# Patient Record
Sex: Male | Born: 1935 | Race: White | Hispanic: No | Marital: Married | Smoking: Never smoker
Health system: Southern US, Community
[De-identification: ages and names within clinical notes are randomized; demographics above are authoritative.]

## PROBLEM LIST (undated history)

## (undated) DIAGNOSIS — I712 Thoracic aortic aneurysm, without rupture: Secondary | ICD-10-CM

## (undated) DIAGNOSIS — R55 Syncope and collapse: Secondary | ICD-10-CM

## (undated) DIAGNOSIS — Z9889 Other specified postprocedural states: Secondary | ICD-10-CM

## (undated) DIAGNOSIS — R911 Solitary pulmonary nodule: Secondary | ICD-10-CM

## (undated) DIAGNOSIS — I340 Nonrheumatic mitral (valve) insufficiency: Secondary | ICD-10-CM

## (undated) DIAGNOSIS — E785 Hyperlipidemia, unspecified: Secondary | ICD-10-CM

## (undated) DIAGNOSIS — I714 Abdominal aortic aneurysm, without rupture: Secondary | ICD-10-CM

## (undated) DIAGNOSIS — I472 Ventricular tachycardia: Secondary | ICD-10-CM

## (undated) DIAGNOSIS — N183 Chronic kidney disease, stage 3 unspecified: Secondary | ICD-10-CM

## (undated) DIAGNOSIS — G500 Trigeminal neuralgia: Secondary | ICD-10-CM

## (undated) DIAGNOSIS — I129 Hypertensive chronic kidney disease with stage 1 through stage 4 chronic kidney disease, or unspecified chronic kidney disease: Secondary | ICD-10-CM

## (undated) DIAGNOSIS — I4729 Other ventricular tachycardia: Secondary | ICD-10-CM

## (undated) DIAGNOSIS — I10 Essential (primary) hypertension: Secondary | ICD-10-CM

## (undated) DIAGNOSIS — M7710 Lateral epicondylitis, unspecified elbow: Secondary | ICD-10-CM

## (undated) HISTORY — DX: Ventricular tachycardia: I47.2

## (undated) HISTORY — DX: Other ventricular tachycardia: I47.29

## (undated) HISTORY — DX: Lateral epicondylitis, unspecified elbow: M77.10

## (undated) HISTORY — PX: CATARACT EXTRACTION, BILATERAL: SHX1313

## (undated) HISTORY — DX: Chronic kidney disease, stage 3 unspecified: N18.30

## (undated) HISTORY — DX: Trigeminal neuralgia: G50.0

## (undated) HISTORY — DX: Hypertensive chronic kidney disease with stage 1 through stage 4 chronic kidney disease, or unspecified chronic kidney disease: I12.9

## (undated) HISTORY — DX: Hyperlipidemia, unspecified: E78.5

## (undated) HISTORY — DX: Chronic kidney disease, stage 3 (moderate): N18.3

---

## 2005-08-01 ENCOUNTER — Ambulatory Visit: Payer: Self-pay | Admitting: General Surgery

## 2005-08-01 HISTORY — PX: COLONOSCOPY: SHX174

## 2005-08-01 LAB — HM COLONOSCOPY

## 2007-12-24 ENCOUNTER — Encounter: Admission: RE | Admit: 2007-12-24 | Discharge: 2007-12-24 | Payer: Self-pay | Admitting: Neurosurgery

## 2010-02-01 HISTORY — PX: ESOPHAGOGASTRODUODENOSCOPY: SHX1529

## 2010-02-24 ENCOUNTER — Inpatient Hospital Stay: Payer: Self-pay | Admitting: Internal Medicine

## 2010-03-01 LAB — PATHOLOGY REPORT

## 2012-06-10 ENCOUNTER — Ambulatory Visit: Payer: Self-pay | Admitting: Ophthalmology

## 2012-06-23 ENCOUNTER — Ambulatory Visit: Payer: Self-pay | Admitting: Ophthalmology

## 2012-09-01 ENCOUNTER — Ambulatory Visit: Payer: Self-pay | Admitting: Ophthalmology

## 2013-10-01 HISTORY — PX: CYST EXCISION: SHX5701

## 2014-09-23 NOTE — Op Note (Signed)
DATE OF BIRTH:  1929/09/30  DATE OF PROCEDURE:  06/23/2012  PREOPERATIVE DIAGNOSIS: Visually significant cataract of the right eye.   POSTOPERATIVE DIAGNOSIS: Visually significant cataract of the right eye.   OPERATIVE PROCEDURE: Cataract extraction by phacoemulsification with implant of intraocular lens to right eye.   SURGEON: Galen Manila, MD.   ANESTHESIA:  1. Managed anesthesia care.  2. Topical tetracaine drops followed by 2% Xylocaine jelly applied in the preoperative holding area.   COMPLICATIONS: None.   TECHNIQUE:  Stop and chop.  DESCRIPTION OF PROCEDURE: The patient was examined and consented in the preoperative holding area where the aforementioned topical anesthesia was applied to the right eye and then brought back to the Operating Room where the right eye was prepped and draped in the usual sterile ophthalmic fashion and a lid speculum was placed. A paracentesis was created with the side port blade and the anterior chamber was filled with viscoelastic. A near clear corneal incision was performed with the steel keratome. A continuous curvilinear capsulorrhexis was performed with a cystotome followed by the capsulorrhexis forceps. Hydrodissection and hydrodelineation were carried out with BSS on a blunt cannula. The lens was removed in a stop and chop technique and the remaining cortical material was removed with the irrigation-aspiration handpiece. The capsular bag was inflated with viscoelastic and the Tecnis ZCB00 16.0-diopter lens, serial number [id number] was placed in the capsular bag without complication. The remaining viscoelastic was removed from the eye with the irrigation-aspiration handpiece. The wounds were hydrated. The anterior chamber was flushed with Miostat and the eye was inflated to physiologic pressure. 0.1 mL of cefuroxime concentration 10 mg/mL was placed in the anterior chamber. The wounds were found to be water tight. The eye was dressed with Vigamox.  The patient was given protective glasses to wear throughout the day and a shield with which to sleep tonight. The patient was also given drops with which to begin a drop regimen today and will follow-up with me in one day.    ____________________________ Jerilee Field. Rayola Everhart, MD wlp:ms D: 06/23/2012 15:42:42 ET T: 06/23/2012 23:26:13 ET JOB#: [id number]  cc: [place] Souter L. Aulden Calise, MD, <Dictator> Jerilee Field Owen Pagnotta MD ELECTRONICALLY SIGNED 07/09/2012 14:23

## 2014-09-23 NOTE — Op Note (Signed)
PATIENT NAME:  Roy Palmer, Roy Palmer MR#:  [id number] DATE OF BIRTH:  1929/08/21  DATE OF PROCEDURE:  09/01/2012  PREOPERATIVE DIAGNOSIS:  Visually significant cataract of the left eye.   POSTOPERATIVE DIAGNOSIS:  Visually significant cataract of the left eye.   OPERATIVE PROCEDURE:  Cataract extraction by phacoemulsification with implant of intraocular lens to the left eye.   SURGEON:  Galen Manila, MD  ANESTHESIA:  1. Managed anesthesia care.  2. Topical tetracaine drops followed by 2% Xylocaine jelly applied in the preoperative holding area.   COMPLICATIONS:  None.   TECHNIQUE:  Stop-and-chop.  DESCRIPTION OF PROCEDURE:  The patient was examined and consented in the preoperative holding area where the aforementioned topical anesthesia was applied to the left eye and then brought back to the Operating Room where the left eye was prepped and draped in the usual sterile ophthalmic fashion and a lid speculum was placed. A paracentesis was created with the side port blade and the anterior chamber was filled with viscoelastic. A near clear corneal incision was performed with the steel keratome. A continuous curvilinear capsulorrhexis was performed with a cystotome followed by the capsulorrhexis forceps. Hydrodissection and hydrodelineation were carried out with BSS on a blunt cannula. The lens was removed in a stop-and-chop technique and the remaining cortical material was removed with the irrigation-aspiration handpiece. The capsular bag was inflated with viscoelastic and the Tecnis ZCB00 16.0-diopter lens, serial number [id number] was placed in the capsular bag without complication. The remaining viscoelastic was removed from the eye with the irrigation-aspiration handpiece. The wounds were hydrated. The anterior chamber was flushed with Miostat and the eye was inflated to physiologic pressure. 0.1 mL of cefuroxime concentration 10 mg/mL was placed in the anterior chamber. The wounds were found to be  water tight. The eye was dressed with Vigamox. The patient was given protective glasses to wear throughout the day and a shield with which to sleep tonight. The patient was also given drops with which to begin a drop regimen today and will follow-up with me in one day.    ____________________________ Jerilee Field. Odelia Graciano, MD wlp:si D: 09/01/2012 22:05:48 ET T: 09/02/2012 00:43:53 ET JOB#: [id number]  cc: Rubbie Goostree L. Hiral Lukasiewicz, MD, <Dictator> Jerilee Field Aadi Bordner MD ELECTRONICALLY SIGNED 09/02/2012 10:30

## 2015-02-02 DIAGNOSIS — M25551 Pain in right hip: Secondary | ICD-10-CM | POA: Insufficient documentation

## 2015-11-01 ENCOUNTER — Ambulatory Visit: Payer: Self-pay | Admitting: Unknown Physician Specialty

## 2016-02-08 ENCOUNTER — Ambulatory Visit (INDEPENDENT_AMBULATORY_CARE_PROVIDER_SITE_OTHER): Payer: Medicare Other | Admitting: Family Medicine

## 2016-02-08 ENCOUNTER — Encounter: Payer: Self-pay | Admitting: Family Medicine

## 2016-02-08 VITALS — BP 151/80 | HR 67 | Temp 97.7°F | Ht 69.0 in | Wt 192.0 lb

## 2016-02-08 DIAGNOSIS — R42 Dizziness and giddiness: Secondary | ICD-10-CM

## 2016-02-08 NOTE — Patient Instructions (Signed)
Follow up as needed

## 2016-02-08 NOTE — Progress Notes (Signed)
   BP (!) 151/80   Pulse 67   Temp 97.7 F (36.5 C)   Ht 5\' 9"  (1.753 m)   Wt 192 lb (87.1 kg)   SpO2 98%   BMI 28.35 kg/m    Subjective:    Patient ID: Roy Palmer, male    DOB: 03/10/1930, 82 y.o.   MRN: [id number]  HPI: Roy Palmer is a 82 y.o. male  Chief Complaint  Patient presents with  . Other    [facility] nurse came for a visit and told him his pulse was weak. He does notice if he does strenous work he sometimes gets a little dizzy. No Chest pain or SOB.   Patient presents after a [facility] nurse came for a home visit and told him that he had a "weak pulse" and should get checked out. He feels well for the most part, still works outside as often as he can. Has noticed the past 2 months that with exertion he is dizzy, "swimmy headed", and easily fatigued. This has never happened to him before. Denies CP, SOB, LE edema. Known heart murmur (he can't remember what specific one), last saw Cardiology about 15 years ago he believes. Otherwise in very good health, only takes prilosec.   Relevant past medical, surgical, family and social history reviewed and updated as indicated. Interim medical history since our last visit reviewed. Allergies and medications reviewed and updated.  Review of Systems  Constitutional: Positive for fatigue.  HENT: Negative.   Respiratory: Negative.   Cardiovascular: Negative.   Gastrointestinal: Negative.   Genitourinary: Negative.   Musculoskeletal: Negative.   Skin: Negative.   Neurological: Positive for dizziness.  Psychiatric/Behavioral: Negative.     Per HPI unless specifically indicated above     Objective:    BP (!) 151/80   Pulse 67   Temp 97.7 F (36.5 C)   Ht 5\' 9"  (1.753 m)   Wt 192 lb (87.1 kg)   SpO2 98%   BMI 28.35 kg/m   Wt Readings from Last 3 Encounters:  02/08/16 192 lb (87.1 kg)  10/29/13 188 lb (85.3 kg)    Physical Exam  Constitutional: He is oriented to person, place, and time. He appears well-developed and  well-nourished.  HENT:  Head: Atraumatic.  Eyes: Conjunctivae are normal. No scleral icterus.  Neck: Normal range of motion. Neck supple.  Cardiovascular: Normal rate and intact distal pulses.   Murmur (Systolic murmur) heard. Pulmonary/Chest: Effort normal and breath sounds normal.  Musculoskeletal: Normal range of motion.  Neurological: He is alert and oriented to person, place, and time.  Skin: Skin is warm and dry.  Psychiatric: He has a normal mood and affect. His behavior is normal.  Nursing note and vitals reviewed.     Assessment & Plan:   Problem List Items Addressed This Visit    None    Visit Diagnoses    Dizziness    -  Primary   Does have a known murmur, referral placed for Cardiology to evaluate for progressing valvular disase.    Relevant Orders   Ambulatory referral to Cardiology      Follow up plan: Return if symptoms worsen or fail to improve.

## 2016-02-09 ENCOUNTER — Encounter: Payer: Self-pay | Admitting: Internal Medicine

## 2016-02-09 ENCOUNTER — Ambulatory Visit (INDEPENDENT_AMBULATORY_CARE_PROVIDER_SITE_OTHER): Payer: Medicare Other | Admitting: Internal Medicine

## 2016-02-09 VITALS — BP 140/80 | HR 73 | Ht 69.0 in | Wt 194.8 lb

## 2016-02-09 DIAGNOSIS — R011 Cardiac murmur, unspecified: Secondary | ICD-10-CM

## 2016-02-09 DIAGNOSIS — I493 Ventricular premature depolarization: Secondary | ICD-10-CM

## 2016-02-09 DIAGNOSIS — R42 Dizziness and giddiness: Secondary | ICD-10-CM | POA: Diagnosis not present

## 2016-02-09 MED ORDER — ASPIRIN EC 81 MG PO TBEC: 81.0000 mg | DELAYED_RELEASE_TABLET | Freq: Every day | ORAL | Status: DC

## 2016-02-09 NOTE — Progress Notes (Signed)
 New Outpatient Visit Date: 02/09/2016  Referring Provider: Rachel Elizabeth Lane, PA-C [address] [place],  [address]  CC: Dizziness  HPI:  [name] is a 82 y.o. year-old male with history of HTN (not on medications), HLD, CKD, heart murmur, and trigeminal neuralgia, who has been referred by Ms. Lane for evaluation of dizziness.  The patient reports that for the last month, he has experienced several episodes of lightheadedness and nausea when exerting himself on his property.  The symptoms resolve with rest over the course of 30 minutes.  He denies passing out, as well as chest pain, shortness of breath, palpitations, and edema.  He was evaluated by a [facility] nurse ~1 week ago, who reportedly noted a "weak" pulse.  The patient is typically very active and does not recall any precipitating events around the time that his dizziness began.  He reports having a stress test ~10-15 years ago, which he believes was normal.  Past Medical History:  Diagnosis Date  . CKD (chronic kidney disease) stage 3, GFR 30-59 ml/min   . Heart murmur   . Hyperlipidemia   . Hypertension   . Hypertensive kidney disease   . Trigeminal neuralgia     Past Surgical History:  Procedure Laterality Date  . CATARACT EXTRACTION, BILATERAL    . COLONOSCOPY  08/2005  . CYST EXCISION  10/2013   from neck  . ESOPHAGOGASTRODUODENOSCOPY  02/2010    Outpatient Encounter Prescriptions as of 02/09/2016  Medication Sig  . omeprazole (PRILOSEC) 20 MG capsule Take 20 mg by mouth daily.  . aspirin EC 81 MG tablet Take 1 tablet (81 mg total) by mouth daily.   No facility-administered encounter medications on file as of 02/09/2016.     Lyrica [pregabalin]  Social History   Social History  . Marital status: Married    Spouse name: N/A  . Number of children: N/A  . Years of education: N/A   Occupational History  . Not on file.   Social History Main Topics  . Smoking status: Never Smoker  . Smokeless tobacco:  Never Used  . Alcohol use No  . Drug use: No  . Sexual activity: Not on file   Other Topics Concern  . Not on file   Social History Narrative  . No narrative on file    Family History  Problem Relation Age of Onset  . Hypertension Mother   . Stroke Mother   . Heart disease Father   . Heart attack Father   . COPD Neg Hx   . Cancer Neg Hx   . Diabetes Neg Hx     Review of Systems: A 12-system review of systems was performed and was negative except as noted in the HPI.  PHYSICAL EXAM: BP 140/80 (BP Location: Left Arm, Patient Position: Sitting, Cuff Size: Normal)   Pulse 73   Ht 5' 9" (1.753 m)   Wt 194 lb 12.8 oz (88.4 kg)   BMI 28.77 kg/m   General:  Well-developed, well nourished elderly man, seated comfortably in the exam room. HEENT: No conjunctival pallor or scleral icterus.  Moist mucous membranes.  OP clear. Neck: Supple without lymphadenopathy, thyromegaly, JVD, or HJR.  No carotid bruit. Lungs: Normal work of breathing.  Clear to auscultation bilaterally without wheezes or crackles. CV: Regular rate and rhythm with occasional extrasystoles.  3/6 holosystolic murmur loudest at the apex.  Normal S1 and S2.  S4 present.  No S3.  Non-displaced PMI. Abd: Bowel sounds present.    Soft, NT/ND without hepatosplenomegaly Ext: No lower extremity edema.  Radial, PT, and DP pulses are 2+ bilaterally Skin: warm and dry without rash Neuro: CNIII-XII intact.  Strength and fine-touch sensation intact in upper and lower extremities bilaterally. Psych: Normal mood and affect.  EKG:  Sinus rhythm with right bundle branch block and left axis deviation (bifascicular block). Frequent PVCs noted. Compared with prior tracing from 06/10/12, PVCs are new.  ASSESSMENT AND PLAN: 82 y/o man with exertional lightheadedness and nausea over the last month, seen for further evaluation.  1. Dizziness Etiology most concerning for a cardiac etiology, including arrhythmia or structural pathology.   EKG notable for bifascicular block that has been present since at least 2014, as well as frequent PVC's that are new.  We have agreed to proceed with expedited echocardiogram to evaluate for cardiomyopathy and/or significant valvular disease.  Based on the results, we will determine further ischemia evaluation.  I would favor LHC given baseline abnormal EKG and frequent PVC's which may reduce sensitivity/specificity of myocardial perfusion stress test.  I have advised the patient to begin taking ASA 81 mg daily.  We will also have him return for labs at the time of TTE next week.  The patient was advised to limit his activities and to seek immediate medical attention if his dizziness recurs or if he develops chest pain and/or shortness of breath.  2. PVC (premature ventricular contraction) Frequent PVC's noted on EKG, including couplet.  Given age and history of HTN/HLD, ischemia is a concern.  As above, will obtain TTE and then proceed with ischemia evaluation.  Will also check BMP to evaluate for electrolyte abnormalities.  If TTE and ischemia evaluation are unrevealing, will then proceed with ambulatory event monitor.  3. Heart murmur Exam most suggestive of mitral regurgitation.  However, exertional dizziness can be seen with significant AS.  Will proceed with TTE ASAP.  Follow-up: Return to clinic in 1 month; will discuss further work-up in the meantime after review of TTE and Labs.  [name], MD 02/09/2016 9:55 PM

## 2016-02-09 NOTE — Patient Instructions (Addendum)
Medication Instructions:  Your physician recommends that you continue on your current medications as directed. Please refer to the Current Medication list given to you today.    Labwork: Fasting lipid and liver profile, CBC, BMET on Sept 13. Nothing to eat or drink after midnight the evening before your labs.   Testing/Procedures: Your physician has requested that you have an echocardiogram. Echocardiography is a painless test that uses sound waves to create images of your heart. It provides your doctor with information about the size and shape of your heart and how well your heart's chambers and valves are working. This procedure takes approximately one hour. There are no restrictions for this procedure.  Wednesday, Sept 13 @ 7:30am in our office.   Follow-Up: Your physician recommends that you schedule a follow-up appointment in: one month with Dr. Okey Dupre.    Any Other Special Instructions Will Be Listed Below (If Applicable).     If you need a refill on your cardiac medications before your next appointment, please call your pharmacy.  Echocardiogram An echocardiogram, or echocardiography, uses sound waves (ultrasound) to produce an image of your heart. The echocardiogram is simple, painless, obtained within a short period of time, and offers valuable information to your health care provider. The images from an echocardiogram can provide information such as:  Evidence of coronary artery disease (CAD).  Heart size.  Heart muscle function.  Heart valve function.  Aneurysm detection.  Evidence of a past heart attack.  Fluid buildup around the heart.  Heart muscle thickening.  Assess heart valve function. LET [facility] CARE PROVIDER KNOW ABOUT:  Any allergies you have.  All medicines you are taking, including vitamins, herbs, eye drops, creams, and over-the-counter medicines.  Previous problems you or members of your family have had with the use of anesthetics.  Any  blood disorders you have.  Previous surgeries you have had.  Medical conditions you have.  Possibility of pregnancy, if this applies. BEFORE THE PROCEDURE  No special preparation is needed. Eat and drink normally.  PROCEDURE   In order to produce an image of your heart, gel will be applied to your chest and a wand-like tool (transducer) will be moved over your chest. The gel will help transmit the sound waves from the transducer. The sound waves will harmlessly bounce off your heart to allow the heart images to be captured in real-time motion. These images will then be recorded.  You may need an IV to receive a medicine that improves the quality of the pictures. AFTER THE PROCEDURE You may return to your normal schedule including diet, activities, and medicines, unless your health care provider tells you otherwise.   This information is not intended to replace advice given to you by your health care provider. Make sure you discuss any questions you have with your health care provider.   Document Released: 05/17/2000 Document Revised: 06/10/2014 Document Reviewed: 01/25/2013 Elsevier Interactive Patient Education [employer]

## 2016-02-14 ENCOUNTER — Ambulatory Visit (INDEPENDENT_AMBULATORY_CARE_PROVIDER_SITE_OTHER): Payer: Medicare Other

## 2016-02-14 ENCOUNTER — Other Ambulatory Visit (INDEPENDENT_AMBULATORY_CARE_PROVIDER_SITE_OTHER): Payer: Medicare Other

## 2016-02-14 ENCOUNTER — Other Ambulatory Visit: Payer: Self-pay

## 2016-02-14 DIAGNOSIS — R011 Cardiac murmur, unspecified: Secondary | ICD-10-CM

## 2016-02-14 DIAGNOSIS — R42 Dizziness and giddiness: Secondary | ICD-10-CM

## 2016-02-14 HISTORY — PX: TRANSTHORACIC ECHOCARDIOGRAM: SHX275

## 2016-02-15 ENCOUNTER — Telehealth: Payer: Self-pay | Admitting: Internal Medicine

## 2016-02-15 LAB — HEPATIC FUNCTION PANEL
ALT: 22 IU/L (ref 0–44)
AST: 14 IU/L (ref 0–40)
Albumin: 4.1 g/dL (ref 3.5–4.7)
Alkaline Phosphatase: 52 IU/L (ref 39–117)
Bilirubin Total: 0.5 mg/dL (ref 0.0–1.2)
Bilirubin, Direct: 0.12 mg/dL (ref 0.00–0.40)
Total Protein: 6.5 g/dL (ref 6.0–8.5)

## 2016-02-15 LAB — BASIC METABOLIC PANEL
BUN/Creatinine Ratio: 13 (ref 10–24)
BUN: 19 mg/dL (ref 8–27)
CO2: 25 mmol/L (ref 18–29)
Calcium: 8.7 mg/dL (ref 8.6–10.2)
Chloride: 102 mmol/L (ref 96–106)
Creatinine, Ser: 1.52 mg/dL — ABNORMAL HIGH (ref 0.76–1.27)
GFR calc Af Amer: 48 mL/min/{1.73_m2} — ABNORMAL LOW (ref >59)
GFR calc non Af Amer: 41 mL/min/{1.73_m2} — ABNORMAL LOW (ref >59)
Glucose: 91 mg/dL (ref 65–99)
Potassium: 4.7 mmol/L (ref 3.5–5.2)
Sodium: 142 mmol/L (ref 134–144)

## 2016-02-15 LAB — CBC
Hematocrit: 44.5 % (ref 37.5–51.0)
Hemoglobin: 15.1 g/dL (ref 12.6–17.7)
MCH: 31.3 pg (ref 26.6–33.0)
MCHC: 33.9 g/dL (ref 31.5–35.7)
MCV: 92 fL (ref 79–97)
Platelets: 168 10*3/uL (ref 150–379)
RBC: 4.82 x10E6/uL (ref 4.14–5.80)
RDW: 13.9 % (ref 12.3–15.4)
WBC: 7.2 10*3/uL (ref 3.4–10.8)

## 2016-02-15 LAB — LIPID PANEL
Chol/HDL Ratio: 5.2 ratio units — ABNORMAL HIGH (ref 0.0–5.0)
Cholesterol, Total: 188 mg/dL (ref 100–199)
HDL: 36 mg/dL — ABNORMAL LOW (ref >39)
LDL Calculated: 127 mg/dL — ABNORMAL HIGH (ref 0–99)
Triglycerides: 124 mg/dL (ref 0–149)
VLDL Cholesterol Cal: 25 mg/dL (ref 5–40)

## 2016-02-15 NOTE — Telephone Encounter (Signed)
I spoke with the patient regarding his labs and echo results.  TTE was notable for moderate to severe MR with possible flail posterior leaflet, as well as borderline low LVEF with possible inferolateral hypokinesis.  In light of his dizziness and abnormal EKG with these echo findings, the patient will need TEE and LHC/RHC for further evaluation.  We will plan to schedule this for the next 1-2 weeks at [facility].  He will also need prehydration given his CKD stage III.  Patient reports feeling well without additional episodes of presyncope since our visit last week.  [name], MD [facility] Pager: [phone]

## 2016-02-16 ENCOUNTER — Other Ambulatory Visit: Payer: Self-pay | Admitting: Internal Medicine

## 2016-02-16 ENCOUNTER — Telehealth: Payer: Self-pay | Admitting: Internal Medicine

## 2016-02-16 ENCOUNTER — Other Ambulatory Visit: Payer: Self-pay

## 2016-02-16 DIAGNOSIS — Z01812 Encounter for preprocedural laboratory examination: Secondary | ICD-10-CM

## 2016-02-16 DIAGNOSIS — I340 Nonrheumatic mitral (valve) insufficiency: Secondary | ICD-10-CM

## 2016-02-16 NOTE — Telephone Encounter (Signed)
Reviewed Dr. Serita Kyle recommendations regarding TEE and L/R heart cath w/pt who is agreeable w/plan. See echo result notes Reviewed instructions and placed a copy in mail to pt home address. Confirmed f/u appt.

## 2016-02-20 ENCOUNTER — Ambulatory Visit
Admission: RE | Admit: 2016-02-20 | Discharge: 2016-02-20 | Disposition: A | Discharge status: Home / Self Care | Payer: Medicare Other | Source: Ambulatory Visit | Attending: Internal Medicine | Admitting: Internal Medicine

## 2016-02-20 ENCOUNTER — Other Ambulatory Visit
Admission: RE | Admit: 2016-02-20 | Discharge: 2016-02-20 | Disposition: A | Discharge status: Home / Self Care | Payer: Medicare Other | Source: Ambulatory Visit | Attending: Internal Medicine | Admitting: Internal Medicine

## 2016-02-20 ENCOUNTER — Ambulatory Visit: Payer: Medicare Other | Admitting: Internal Medicine

## 2016-02-20 DIAGNOSIS — Z01818 Encounter for other preprocedural examination: Secondary | ICD-10-CM | POA: Diagnosis not present

## 2016-02-20 DIAGNOSIS — Z01812 Encounter for preprocedural laboratory examination: Secondary | ICD-10-CM

## 2016-02-20 LAB — PROTIME-INR
INR: 1.02
Prothrombin Time: 13.4 seconds (ref 11.4–15.2)

## 2016-02-22 ENCOUNTER — Telehealth: Payer: Self-pay | Admitting: Internal Medicine

## 2016-02-22 NOTE — Telephone Encounter (Signed)
Reviewed cardiac cath and TEE instructions w/pt who verbalized understanding. He had no questions at this time.

## 2016-02-23 ENCOUNTER — Ambulatory Visit (HOSPITAL_BASED_OUTPATIENT_CLINIC_OR_DEPARTMENT_OTHER)
Admission: RE | Admit: 2016-02-23 | Discharge: 2016-02-23 | Disposition: A | Discharge status: Home / Self Care | Payer: Medicare Other | Source: Ambulatory Visit | Attending: Internal Medicine | Admitting: Internal Medicine

## 2016-02-23 ENCOUNTER — Other Ambulatory Visit: Payer: Self-pay | Admitting: Internal Medicine

## 2016-02-23 ENCOUNTER — Encounter (HOSPITAL_COMMUNITY)
Admission: RE | Disposition: A | Discharge status: Home / Self Care | Payer: Self-pay | Source: Ambulatory Visit | Attending: Internal Medicine

## 2016-02-23 ENCOUNTER — Ambulatory Visit (HOSPITAL_COMMUNITY)
Admission: RE | Admit: 2016-02-23 | Discharge: 2016-02-23 | Disposition: A | Discharge status: Home / Self Care | Payer: Medicare Other | Source: Ambulatory Visit | Attending: Internal Medicine | Admitting: Internal Medicine

## 2016-02-23 DIAGNOSIS — I340 Nonrheumatic mitral (valve) insufficiency: Secondary | ICD-10-CM

## 2016-02-23 DIAGNOSIS — R55 Syncope and collapse: Secondary | ICD-10-CM

## 2016-02-23 DIAGNOSIS — Z8249 Family history of ischemic heart disease and other diseases of the circulatory system: Secondary | ICD-10-CM | POA: Diagnosis not present

## 2016-02-23 DIAGNOSIS — I2510 Atherosclerotic heart disease of native coronary artery without angina pectoris: Secondary | ICD-10-CM | POA: Diagnosis not present

## 2016-02-23 DIAGNOSIS — R42 Dizziness and giddiness: Secondary | ICD-10-CM

## 2016-02-23 DIAGNOSIS — I2584 Coronary atherosclerosis due to calcified coronary lesion: Secondary | ICD-10-CM | POA: Insufficient documentation

## 2016-02-23 DIAGNOSIS — N183 Chronic kidney disease, stage 3 (moderate): Secondary | ICD-10-CM | POA: Insufficient documentation

## 2016-02-23 DIAGNOSIS — I452 Bifascicular block: Secondary | ICD-10-CM | POA: Insufficient documentation

## 2016-02-23 DIAGNOSIS — Z823 Family history of stroke: Secondary | ICD-10-CM | POA: Diagnosis not present

## 2016-02-23 DIAGNOSIS — E785 Hyperlipidemia, unspecified: Secondary | ICD-10-CM | POA: Insufficient documentation

## 2016-02-23 DIAGNOSIS — Z7982 Long term (current) use of aspirin: Secondary | ICD-10-CM | POA: Insufficient documentation

## 2016-02-23 DIAGNOSIS — G500 Trigeminal neuralgia: Secondary | ICD-10-CM | POA: Diagnosis not present

## 2016-02-23 DIAGNOSIS — I129 Hypertensive chronic kidney disease with stage 1 through stage 4 chronic kidney disease, or unspecified chronic kidney disease: Secondary | ICD-10-CM | POA: Insufficient documentation

## 2016-02-23 HISTORY — DX: Nonrheumatic mitral (valve) insufficiency: I34.0

## 2016-02-23 HISTORY — PX: CARDIAC CATHETERIZATION: SHX172

## 2016-02-23 HISTORY — PX: TEE WITHOUT CARDIOVERSION: SHX5443

## 2016-02-23 LAB — POCT I-STAT 3, VENOUS BLOOD GAS (G3P V)
Acid-base deficit: 1 mmol/L (ref 0.0–2.0)
Acid-base deficit: 2 mmol/L (ref 0.0–2.0)
Bicarbonate: 24.2 mmol/L (ref 20.0–28.0)
Bicarbonate: 25.8 mmol/L (ref 20.0–28.0)
O2 Saturation: 55 %
O2 Saturation: 57 %
TCO2: 26 mmol/L (ref 0–100)
TCO2: 27 mmol/L (ref 0–100)
pCO2, Ven: 45.7 mmHg (ref 44.0–60.0)
pCO2, Ven: 48.3 mmHg (ref 44.0–60.0)
pH, Ven: 7.333 (ref 7.250–7.430)
pH, Ven: 7.335 (ref 7.250–7.430)
pO2, Ven: 31 mmHg — CL (ref 32.0–45.0)
pO2, Ven: 32 mmHg (ref 32.0–45.0)

## 2016-02-23 LAB — CREATININE, SERUM
Creatinine, Ser: 1.39 mg/dL — ABNORMAL HIGH (ref 0.61–1.24)
GFR calc Af Amer: 52 mL/min — ABNORMAL LOW (ref >60)
GFR calc non Af Amer: 45 mL/min — ABNORMAL LOW (ref >60)

## 2016-02-23 LAB — POCT I-STAT 3, ART BLOOD GAS (G3+)
Acid-base deficit: 2 mmol/L (ref 0.0–2.0)
Bicarbonate: 24.6 mmol/L (ref 20.0–28.0)
O2 Saturation: 93 %
TCO2: 26 mmol/L (ref 0–100)
pCO2 arterial: 46.3 mmHg (ref 32.0–48.0)
pH, Arterial: 7.333 — ABNORMAL LOW (ref 7.350–7.450)
pO2, Arterial: 73 mmHg — ABNORMAL LOW (ref 83.0–108.0)

## 2016-02-23 SURGERY — RIGHT/LEFT HEART CATH AND CORONARY ANGIOGRAPHY
Anesthesia: LOCAL

## 2016-02-23 SURGERY — ECHOCARDIOGRAM, TRANSESOPHAGEAL
Anesthesia: Moderate Sedation

## 2016-02-23 MED ORDER — FENTANYL CITRATE (PF) 100 MCG/2ML IJ SOLN
INTRAMUSCULAR | Status: DC | PRN
  Administered 2016-02-23: 25 ug via INTRAVENOUS

## 2016-02-23 MED ORDER — HEPARIN (PORCINE) IN NACL 2-0.9 UNIT/ML-% IJ SOLN
INTRAMUSCULAR | Status: DC | PRN
  Administered 2016-02-23: 1500 mL

## 2016-02-23 MED ORDER — ASPIRIN 81 MG PO CHEW: 81.0000 mg | CHEWABLE_TABLET | ORAL | Status: DC

## 2016-02-23 MED ORDER — HEPARIN SODIUM (PORCINE) 1000 UNIT/ML IJ SOLN
INTRAMUSCULAR | Status: DC | PRN
Start: 2016-02-23 — End: 2016-02-23
  Administered 2016-02-23: 4000 [IU] via INTRAVENOUS

## 2016-02-23 MED ORDER — LIDOCAINE HCL (PF) 1 % IJ SOLN
INTRAMUSCULAR | Status: DC | PRN
  Administered 2016-02-23: 2 mL

## 2016-02-23 MED ORDER — VERAPAMIL HCL 2.5 MG/ML IV SOLN
INTRAVENOUS | Status: DC | PRN
  Administered 2016-02-23: 10 mL via INTRA_ARTERIAL

## 2016-02-23 MED ORDER — VERAPAMIL HCL 2.5 MG/ML IV SOLN
INTRAVENOUS | Status: AC
  Filled 2016-02-23: qty 2

## 2016-02-23 MED ORDER — MIDAZOLAM HCL 2 MG/2ML IJ SOLN
INTRAMUSCULAR | Status: AC
  Filled 2016-02-23: qty 2

## 2016-02-23 MED ORDER — SODIUM CHLORIDE 0.9 % IV SOLN: 250.0000 mL | INTRAVENOUS | Status: DC | PRN

## 2016-02-23 MED ORDER — HEPARIN (PORCINE) IN NACL 2-0.9 UNIT/ML-% IJ SOLN
INTRAMUSCULAR | Status: AC
  Filled 2016-02-23: qty 500

## 2016-02-23 MED ORDER — SODIUM CHLORIDE 0.9% FLUSH: 3.0000 mL | Freq: Two times a day (BID) | INTRAVENOUS | Status: DC

## 2016-02-23 MED ORDER — SODIUM CHLORIDE 0.9 % IV SOLN
INTRAVENOUS | Status: DC
  Administered 2016-02-23: 10:00:00 via INTRAVENOUS

## 2016-02-23 MED ORDER — ATORVASTATIN CALCIUM 20 MG PO TABS: 20.0000 mg | ORAL_TABLET | Freq: Every day | ORAL | 3 refills | Status: DC

## 2016-02-23 MED ORDER — IOPAMIDOL (ISOVUE-370) INJECTION 76%
INTRAVENOUS | Status: DC | PRN
  Administered 2016-02-23: 40 mL

## 2016-02-23 MED ORDER — IOPAMIDOL (ISOVUE-370) INJECTION 76%
INTRAVENOUS | Status: AC
  Filled 2016-02-23: qty 100

## 2016-02-23 MED ORDER — SODIUM CHLORIDE 0.9% FLUSH: 3.0000 mL | INTRAVENOUS | Status: DC | PRN

## 2016-02-23 MED ORDER — AMIODARONE HCL 200 MG PO TABS: ORAL_TABLET | ORAL | 1 refills | Status: DC

## 2016-02-23 MED ORDER — MIDAZOLAM HCL 10 MG/2ML IJ SOLN
INTRAMUSCULAR | Status: DC | PRN
  Administered 2016-02-23 (×2): 2 mg via INTRAVENOUS

## 2016-02-23 MED ORDER — MIDAZOLAM HCL 5 MG/ML IJ SOLN
INTRAMUSCULAR | Status: AC
  Filled 2016-02-23: qty 2

## 2016-02-23 MED ORDER — FENTANYL CITRATE (PF) 100 MCG/2ML IJ SOLN
INTRAMUSCULAR | Status: AC
  Filled 2016-02-23: qty 2

## 2016-02-23 MED ORDER — LIDOCAINE HCL (PF) 1 % IJ SOLN
INTRAMUSCULAR | Status: AC
  Filled 2016-02-23: qty 30

## 2016-02-23 MED ORDER — FENTANYL CITRATE (PF) 100 MCG/2ML IJ SOLN
INTRAMUSCULAR | Status: DC | PRN
  Administered 2016-02-23: 12.5 ug via INTRAVENOUS
  Administered 2016-02-23: 25 ug via INTRAVENOUS
  Administered 2016-02-23: 12.5 ug via INTRAVENOUS

## 2016-02-23 MED ORDER — MIDAZOLAM HCL 2 MG/2ML IJ SOLN
INTRAMUSCULAR | Status: DC | PRN
  Administered 2016-02-23: 1 mg via INTRAVENOUS

## 2016-02-23 MED ORDER — BUTAMBEN-TETRACAINE-BENZOCAINE 2-2-14 % EX AERO
INHALATION_SPRAY | CUTANEOUS | Status: DC | PRN
  Administered 2016-02-23: 2 via TOPICAL

## 2016-02-23 SURGICAL SUPPLY — 13 items
CATH BALLN WEDGE 5F 110CM (CATHETERS) ×2 IMPLANT
CATH INFINITI 5 FR JL3.5 (CATHETERS) ×2 IMPLANT
CATH INFINITI JR4 5F (CATHETERS) ×2 IMPLANT
DEVICE RAD COMP TR BAND LRG (VASCULAR PRODUCTS) ×2 IMPLANT
GLIDESHEATH SLEND SS 6F .021 (SHEATH) ×2 IMPLANT
KIT HEART LEFT (KITS) ×2 IMPLANT
PACK CARDIAC CATHETERIZATION (CUSTOM PROCEDURE TRAY) ×2 IMPLANT
SHEATH FAST CATH BRACH 5F 5CM (SHEATH) ×2 IMPLANT
SYR MEDRAD MARK V 150ML (SYRINGE) ×2 IMPLANT
TRANSDUCER W/STOPCOCK (MISCELLANEOUS) ×4 IMPLANT
TUBING CIL FLEX 10 FLL-RA (TUBING) ×2 IMPLANT
WIRE HI TORQ VERSACORE-J 145CM (WIRE) ×2 IMPLANT
WIRE SAFE-T 1.5MM-J .035X260CM (WIRE) ×2 IMPLANT

## 2016-02-23 NOTE — H&P (View-Only) (Signed)
New Outpatient Visit Date: 02/09/2016  Referring Provider: Particia Nearingachel Elizabeth Lane, PA-C [address] [place], [place] [id number]  CC: Dizziness  HPI:  [name] is a 82 y.o. year-old male with history of HTN (not on medications), HLD, CKD, heart murmur, and trigeminal neuralgia, who has been referred by Ms. Lane for evaluation of dizziness.  The patient reports that for the last month, he has experienced several episodes of lightheadedness and nausea when exerting himself on his property.  The symptoms resolve with rest over the course of 30 minutes.  He denies passing out, as well as chest pain, shortness of breath, palpitations, and edema.  [facility], who reportedly noted a "weak" pulse.  The patient is typically very active and does not recall any precipitating events around the time that his dizziness began.  He reports having a stress test ~10-15 years ago, which he believes was normal.  Past Medical History:  Diagnosis Date  . CKD (chronic kidney disease) stage 3, GFR 30-59 ml/min   . Heart murmur   . Hyperlipidemia   . Hypertension   . Hypertensive kidney disease   . Trigeminal neuralgia     Past Surgical History:  Procedure Laterality Date  . CATARACT EXTRACTION, BILATERAL    . COLONOSCOPY  08/2005  . CYST EXCISION  10/2013   from neck  . ESOPHAGOGASTRODUODENOSCOPY  02/2010    Outpatient Encounter Prescriptions as of 02/09/2016  Medication Sig  . omeprazole (PRILOSEC) 20 MG capsule Take 20 mg by mouth daily.  Marland Kitchen. aspirin EC 81 MG tablet Take 1 tablet (81 mg total) by mouth daily.   No facility-administered encounter medications on file as of 02/09/2016.     Lyrica [pregabalin]  Social History   Social History  . Marital status: Married    Spouse name: N/A  . Number of children: N/A  . Years of education: N/A   Occupational History  . Not on file.   Social History Main Topics  . Smoking status: Never Smoker  . Smokeless tobacco:  Never Used  . Alcohol use No  . Drug use: No  . Sexual activity: Not on file   Other Topics Concern  . Not on file   Social History Narrative  . No narrative on file    Family History  Problem Relation Age of Onset  . Hypertension Mother   . Stroke Mother   . Heart disease Father   . Heart attack Father   . COPD Neg Hx   . Cancer Neg Hx   . Diabetes Neg Hx     Review of Systems: A 12-system review of systems was performed and was negative except as noted in the HPI.  PHYSICAL EXAM: BP 140/80 (BP Location: Left Arm, Patient Position: Sitting, Cuff Size: Normal)   Pulse 73   Ht 5\' 9"  (1.753 m)   Wt 194 lb 12.8 oz (88.4 kg)   BMI 28.77 kg/m   General:  Well-developed, well nourished elderly man, seated comfortably in the exam room. HEENT: No conjunctival pallor or scleral icterus.  Moist mucous membranes.  OP clear. Neck: Supple without lymphadenopathy, thyromegaly, JVD, or HJR.  No carotid bruit. Lungs: Normal work of breathing.  Clear to auscultation bilaterally without wheezes or crackles. CV: Regular rate and rhythm with occasional extrasystoles.  3/6 holosystolic murmur loudest at the apex.  Normal S1 and S2.  S4 present.  No S3.  Non-displaced PMI. Abd: Bowel sounds present.  Soft, NT/ND without hepatosplenomegaly Ext: No lower extremity edema.  Radial, PT, and DP pulses are 2+ bilaterally Skin: warm and dry without rash Neuro: CNIII-XII intact.  Strength and fine-touch sensation intact in upper and lower extremities bilaterally. Psych: Normal mood and affect.  EKG:  Sinus rhythm with right bundle branch block and left axis deviation (bifascicular block). Frequent PVCs noted. Compared with prior tracing from 06/10/12, PVCs are new.  ASSESSMENT AND PLAN: 82 y/o man with exertional lightheadedness and nausea over the last month, seen for further evaluation.  1. Dizziness Etiology most concerning for a cardiac etiology, including arrhythmia or structural pathology.   EKG notable for bifascicular block that has been present since at least 2014, as well as frequent PVC's that are new.  We have agreed to proceed with expedited echocardiogram to evaluate for cardiomyopathy and/or significant valvular disease.  Based on the results, we will determine further ischemia evaluation.  I would favor LHC given baseline abnormal EKG and frequent PVC's which may reduce sensitivity/specificity of myocardial perfusion stress test.  I have advised the patient to begin taking ASA 81 mg daily.  We will also have him return for labs at the time of TTE next week.  The patient was advised to limit his activities and to seek immediate medical attention if his dizziness recurs or if he develops chest pain and/or shortness of breath.  2. PVC (premature ventricular contraction) Frequent PVC's noted on EKG, including couplet.  Given age and history of HTN/HLD, ischemia is a concern.  As above, will obtain TTE and then proceed with ischemia evaluation.  Will also check BMP to evaluate for electrolyte abnormalities.  If TTE and ischemia evaluation are unrevealing, will then proceed with ambulatory event monitor.  3. Heart murmur Exam most suggestive of mitral regurgitation.  However, exertional dizziness can be seen with significant AS.  Will proceed with TTE ASAP.  Follow-up: Return to clinic in 1 month; will discuss further work-up in the meantime after review of TTE and Labs.  [name], MD 02/09/2016 9:55 PM

## 2016-02-23 NOTE — Discharge Instructions (Signed)
Radial Site Care Refer to this sheet in the next few weeks. These instructions provide you with information about caring for yourself after your procedure. Your health care provider may also give you more specific instructions. Your treatment has been planned according to current medical practices, but problems sometimes occur. Call your health care provider if you have any problems or questions after your procedure. WHAT TO EXPECT AFTER THE PROCEDURE After your procedure, it is typical to have the following:  Bruising at the radial site that usually fades within 1-2 weeks.  Blood collecting in the tissue (hematoma) that may be painful to the touch. It should usually decrease in size and tenderness within 1-2 weeks. HOME CARE INSTRUCTIONS  Take medicines only as directed by your health care provider.  You may shower 24-48 hours after the procedure or as directed by your health care provider. Remove the bandage (dressing) and gently wash the site with plain soap and water. Pat the area dry with a clean towel. Do not rub the site, because this may cause bleeding.  Do not take baths, swim, or use a hot tub until your health care provider approves.  Check your insertion site every day for redness, swelling, or drainage.  Do not apply powder or lotion to the site.  Do not flex or bend the affected arm for 24 hours or as directed by your health care provider.  Do not push or pull heavy objects with the affected arm for 24 hours or as directed by your health care provider.  Do not lift over 10 lb (4.5 kg) for 5 days after your procedure or as directed by your health care provider.  Ask your health care provider when it is okay to:  Return to work or school.  Resume usual physical activities or sports.  Resume sexual activity.  Do not drive home if you are discharged the same day as the procedure. Have someone else drive you.  You may drive 24 hours after the procedure unless otherwise  instructed by your health care provider.  Do not operate machinery or power tools for 24 hours after the procedure.  If your procedure was done as an outpatient procedure, which means that you went home the same day as your procedure, a responsible adult should be with you for the first 24 hours after you arrive home.  Keep all follow-up visits as directed by your health care provider. This is important. SEEK MEDICAL CARE IF:  You have a fever.  You have chills.  You have increased bleeding from the radial site. Hold pressure on the site. SEEK IMMEDIATE MEDICAL CARE IF:  You have unusual pain at the radial site.  You have redness, warmth, or swelling at the radial site.  You have drainage (other than a small amount of blood on the dressing) from the radial site.  The radial site is bleeding, and the bleeding does not stop after 30 minutes of holding steady pressure on the site.  Your arm or hand becomes pale, cool, tingly, or numb.   This information is not intended to replace advice given to you by your health care provider. Make sure you discuss any questions you have with your health care provider.   Document Released: 06/22/2010 Document Revised: 06/10/2014 Document Reviewed: 12/06/2013 Elsevier Interactive Patient Education 2016 [employer]. TEE  YOU HAD AN CARDIAC PROCEDURE TODAY: Refer to the procedure report and other information in the discharge instructions given to you for any specific questions about what  was found during the examination. If this information does not answer your questions, please call [facility] office at [phone] to clarify.   DIET: Your first meal following the procedure should be a light meal and then it is ok to progress to your normal diet. A half-sandwich or bowl of soup is an example of a good first meal. Heavy or fried foods are harder to digest and may make you feel nauseous or bloated. Drink plenty of fluids but you should avoid  alcoholic beverages for 24 hours. If you had a esophageal dilation, please see attached instructions for diet.   ACTIVITY: Your care partner should take you home directly after the procedure. You should plan to take it easy, moving slowly for the rest of the day. You can resume normal activity the day after the procedure however YOU SHOULD NOT DRIVE, use power tools, machinery or perform tasks that involve climbing or major physical exertion for 24 hours (because of the sedation medicines used during the test).   SYMPTOMS TO REPORT IMMEDIATELY: A cardiologist can be reached at any hour. Please call [phone] for any of the following symptoms:  Vomiting of blood or coffee ground material  New, significant abdominal pain  New, significant chest pain or pain under the shoulder blades  Painful or persistently difficult swallowing  New shortness of breath  Black, tarry-looking or red, bloody stools  FOLLOW UP:  Please also call with any specific questions about appointments or follow up tests.

## 2016-02-23 NOTE — Progress Notes (Signed)
  Echocardiogram Echocardiogram Transesophageal has been performed.  Leta Jungling M 02/23/2016, 12:51 PM

## 2016-02-23 NOTE — Interval H&P Note (Signed)
History and Physical Interval Note:  02/23/2016 10:49 AM  Roy Palmer  has presented today for transesophageal echocargiogram, with the diagnosis of mitral regurgitation and presyncope.  The various methods of treatment have been discussed with the patient and family. After consideration of risks, benefits and other options for treatment, the patient has consented to  Procedure(s): TRANSESOPHAGEAL ECHOCARDIOGRAM (TEE) (N/A) as a surgical intervention .  The patient's history has been reviewed, patient examined, no change in status, stable for surgery.  I have reviewed the patient's chart and labs.  Questions were answered to the patient's satisfaction.     Harvest Stanco

## 2016-02-23 NOTE — Interval H&P Note (Signed)
History and Physical Interval Note:  02/23/2016 10:24 AM  Roy Palmer  has presented today for cardiac catheterization, with the diagnosis of mitral regurgitation and presyncope.  The various methods of treatment have been discussed with the patient and family. After consideration of risks, benefits and other options for treatment, the patient has consented to  Procedure(s): Right/Left Heart Cath and Coronary Angiography (N/A) as a surgical intervention .  The patient's history has been reviewed, patient examined, no change in status, stable for surgery.  I have reviewed the patient's chart and labs.  Questions were answered to the patient's satisfaction.    Cath Lab Visit (complete for each Cath Lab visit)  Clinical Evaluation Leading to the Procedure:   ACS: No.  Non-ACS:    Anginal Classification: CCS III (fatigue and presyncope with mild exertion)  Anti-ischemic medical therapy: No Therapy  Non-Invasive Test Results: No non-invasive testing performed  Prior CABG: No previous CABG   Barret Esquivel

## 2016-02-23 NOTE — CV Procedure (Signed)
    Transesophageal Echocardiogram Note  Roy Palmer [id number] November 22, 1929  Procedure: Transesophageal Echocardiogram Indications: Mitral regurgitation, presyncope  Operators: [name], MD and Armanda Magic, MD  Procedure Details Consent: Obtained Time Out: Verified patient identification, verified procedure, site/side was marked, verified correct patient position, special equipment/implants available, Radiology Safety Procedures followed,  medications/allergies/relevent history reviewed, required imaging and test results available.  Performed  Medications:  During this procedure the patient is administered a total of Versed 4 mg and Fentanyl 50 mcg  to achieve and maintain moderate conscious sedation.  The patient's heart rate, blood pressure, and oxygen saturation are monitored continuously during the procedure. The period of conscious sedation is 11 minutes, of which I was present face-to-face 100% of this time.  Left Ventrical:  Normal size, low normal contraction.  Mitral Valve: Degenerative with ruptured chord and flail P2 segment.  Severe MR with pulmonary vein flow reversal.  Aortic Valve: Mildly thickened; trivial aortic regurgitation.  Tricuspid Valve: Normal appearance with mild to moderate regurgitation.  Pulmonic Valve: Normal appearance with mild regurgitation.  Left Atrium/ Left atrial appendage: No thrombus  Atrial septum: Mildly thickened (? Lipomatous hypertrophy).  No color flow across septum.  Bubble study not performed.  Aorta: Mild atherosclerotic plaquing.   Complications: No apparent complications Patient did tolerate procedure well.  Dr. Mayford Knife was present for the entire procedure as proctor.  [name], MD 02/23/2016, 12:27 PM

## 2016-02-24 ENCOUNTER — Other Ambulatory Visit: Payer: Self-pay | Admitting: Internal Medicine

## 2016-02-24 ENCOUNTER — Encounter (HOSPITAL_COMMUNITY): Payer: Self-pay | Admitting: Cardiology

## 2016-02-24 DIAGNOSIS — N183 Chronic kidney disease, stage 3 unspecified: Secondary | ICD-10-CM

## 2016-02-24 DIAGNOSIS — Z9189 Other specified personal risk factors, not elsewhere classified: Secondary | ICD-10-CM

## 2016-02-24 DIAGNOSIS — R42 Dizziness and giddiness: Secondary | ICD-10-CM

## 2016-02-24 DIAGNOSIS — Z79899 Other long term (current) drug therapy: Secondary | ICD-10-CM

## 2016-02-24 DIAGNOSIS — I493 Ventricular premature depolarization: Secondary | ICD-10-CM | POA: Insufficient documentation

## 2016-02-24 NOTE — Addendum Note (Signed)
Addended by: Tarea Skillman A on: 02/24/2016 03:16 PM   Modules accepted: Orders

## 2016-02-24 NOTE — Progress Notes (Addendum)
TEE and LHC/RHC yesterday were notable for severe eccentric MR with chordal rupture and flail P2 segment of the mitral valve.  RHC showed normal left and right heart filling pressures but decreased Fick cardiac output.  Coronary angiography showed mild, non-obstructive CAD.  The patient was observed to have frequent PVC's during the case, similar to his EKG during our office visit earlier this month.  We will obtain PFT's and TSH next week and begin amiodarone to see if his PVC's and symptoms improve.   I will also check a BMP and Mg to ensure his renal function and electrolytes are stable following yesterday's L/RHC, given the patient's CKD.  We will try to get a 24 hour Holter monitor before starting amiodarone in order to establish a baseline PVC burden.  I will also make arrangements for the patient to be seen by Dr. Cornelius Moras (T[facility]) to discuss surgical treatment options for his MR.  [name], MD [facility] Pager: [phone]

## 2016-02-26 ENCOUNTER — Telehealth: Payer: Self-pay | Admitting: Internal Medicine

## 2016-02-26 ENCOUNTER — Encounter (HOSPITAL_COMMUNITY): Payer: Self-pay | Admitting: Internal Medicine

## 2016-02-26 ENCOUNTER — Other Ambulatory Visit: Payer: Self-pay

## 2016-02-26 DIAGNOSIS — I340 Nonrheumatic mitral (valve) insufficiency: Secondary | ICD-10-CM

## 2016-02-26 NOTE — Telephone Encounter (Signed)
Pt states he had a cath on Friday 9/22. States since then he has felt faint, and cant hardly get up with out feeling as if he is going to pass out. Please call.

## 2016-02-26 NOTE — Telephone Encounter (Signed)
I spoke with Roy Palmer by phone.  He reports that his lightheadedness has resolved and he feels back to his baseline.  He checked his BP and HR near the Karine Garn of his lightheadedness episode and reports BP 143/83, HR 57.  I advised him to come to the clinic tomorrow morning at around 9:15 AM for labs and placement of 24-hour Holter monitor at 9:30, followed by PFT's at 10:30.  I will have him start amiodarone after these tests are done.  I advised him to recheck his HR and BP if he becomes dizziness and to seek immediate medical attention if he feel as though he is going to pass out.  [name], MD [facility] Pager: [phone]

## 2016-02-26 NOTE — Telephone Encounter (Signed)
S/w Darel Hong in scheduling. PFT Sept 26 @ 10:30, [facility]. Pt reports periods of dizziness beginning at 8am today. He has had hx of dizzy spells for approximately one month which typically resolve on their own.  He ate breakfast but sx persist. He does not have BP cuff at home. Advised pt to have wife purchase cuff or borrow one, take VS and report to [place]. He should hydrate and elevate legs. Pt had TEE and L/R cath for dizziness. No signs of bleeding. Per Dr. Okey Dupre, pt needs 24 hour HM, PFT, and labs before pt starts taking amiodarone. Pt agreeable to labs and monitor tomorrow @ 9:30 in our office followed by PFT @ 10:30, [facility]. States he will have these tomorrow if he feels better. Pt will report back later today.

## 2016-02-27 ENCOUNTER — Ambulatory Visit (INDEPENDENT_AMBULATORY_CARE_PROVIDER_SITE_OTHER): Payer: Medicare Other

## 2016-02-27 ENCOUNTER — Ambulatory Visit: Payer: Medicare Other | Attending: Internal Medicine

## 2016-02-27 ENCOUNTER — Other Ambulatory Visit (INDEPENDENT_AMBULATORY_CARE_PROVIDER_SITE_OTHER): Payer: Medicare Other

## 2016-02-27 DIAGNOSIS — Z9189 Other specified personal risk factors, not elsewhere classified: Secondary | ICD-10-CM

## 2016-02-27 DIAGNOSIS — N183 Chronic kidney disease, stage 3 unspecified: Secondary | ICD-10-CM

## 2016-02-27 DIAGNOSIS — Z79899 Other long term (current) drug therapy: Secondary | ICD-10-CM

## 2016-02-27 DIAGNOSIS — R42 Dizziness and giddiness: Secondary | ICD-10-CM

## 2016-02-27 DIAGNOSIS — J449 Chronic obstructive pulmonary disease, unspecified: Secondary | ICD-10-CM | POA: Insufficient documentation

## 2016-02-27 DIAGNOSIS — R942 Abnormal results of pulmonary function studies: Secondary | ICD-10-CM | POA: Insufficient documentation

## 2016-02-27 DIAGNOSIS — I493 Ventricular premature depolarization: Secondary | ICD-10-CM

## 2016-02-28 ENCOUNTER — Telehealth: Payer: Self-pay | Admitting: Internal Medicine

## 2016-02-28 LAB — BASIC METABOLIC PANEL
BUN/Creatinine Ratio: 15 (ref 10–24)
BUN: 19 mg/dL (ref 8–27)
CO2: 23 mmol/L (ref 18–29)
Calcium: 8.9 mg/dL (ref 8.6–10.2)
Chloride: 102 mmol/L (ref 96–106)
Creatinine, Ser: 1.28 mg/dL — ABNORMAL HIGH (ref 0.76–1.27)
GFR calc Af Amer: 59 mL/min/{1.73_m2} — ABNORMAL LOW (ref >59)
GFR calc non Af Amer: 51 mL/min/{1.73_m2} — ABNORMAL LOW (ref >59)
Glucose: 129 mg/dL — ABNORMAL HIGH (ref 65–99)
Potassium: 4.2 mmol/L (ref 3.5–5.2)
Sodium: 140 mmol/L (ref 134–144)

## 2016-02-28 LAB — MAGNESIUM: Magnesium: 2.5 mg/dL — ABNORMAL HIGH (ref 1.6–2.3)

## 2016-02-28 LAB — TSH: TSH: 1.64 u[IU]/mL (ref 0.450–4.500)

## 2016-02-28 NOTE — Telephone Encounter (Signed)
Reviewed pt concerns with Dr. Mariah Milling who recommended pt could try taking 200mg  amiodarone BID for a few days before increasing to 400mg  BID.  Reviewed recommendations w/pt who verbalized understanding and is appreciative of the call but states he will wait until he hears back from Dr. Okey Dupre before taking more amiodarone. Reports BP 147/83 at this time. He had no further questions.

## 2016-02-28 NOTE — Telephone Encounter (Signed)
Pt reports he took 400mg  amiodarone this AM as instructed by Dr. Okey Dupre yesterday. Around lunch he started feeling "like a zombie and weak as a house cat".  Reports he could barely walk to the bathroom.   He was unable to focus  but feels this is improving.  "Everything seems weird like it's not real".  He thinks it is a reaction to the amiodarone Reports BP 168/73, HR 68 Pt denies any swelling, difficulty breathing or any other sx.  Advised pt to monitor sx and if they worsen or he experiences new sx, he should be seen immediately in an ER setting. He will hold his evening dose of amiodarone and I will forward to Dr. Okey Dupre to review and advise.  Pt verbalized understanding and is agreeable w/plan. His wife is with him at home and will continue to monitor and assess. He had no further questions at this time.

## 2016-02-28 NOTE — Telephone Encounter (Signed)
Pt c/o medication issue:  1. Name of Medication: pacerone   2. How are you currently taking this medication (dosage and times per day)? 400 mg bid  3. Are you having a reaction (difficulty breathing--STAT)? Everything is weird   4. What is your medication issue?   Patient took fist dose at 9 this morning cant focus feel weird like out I dont know where cannot focus on anything

## 2016-02-29 ENCOUNTER — Encounter: Payer: Self-pay | Admitting: Emergency Medicine

## 2016-02-29 ENCOUNTER — Other Ambulatory Visit: Payer: Self-pay

## 2016-02-29 ENCOUNTER — Emergency Department
Admission: EM | Admit: 2016-02-29 | Discharge: 2016-02-29 | Disposition: A | Discharge status: Home / Self Care | Payer: Medicare Other | Attending: Emergency Medicine | Admitting: Emergency Medicine

## 2016-02-29 ENCOUNTER — Emergency Department: Payer: Medicare Other

## 2016-02-29 DIAGNOSIS — R002 Palpitations: Secondary | ICD-10-CM | POA: Diagnosis not present

## 2016-02-29 DIAGNOSIS — Z79899 Other long term (current) drug therapy: Secondary | ICD-10-CM | POA: Insufficient documentation

## 2016-02-29 DIAGNOSIS — Z7982 Long term (current) use of aspirin: Secondary | ICD-10-CM | POA: Insufficient documentation

## 2016-02-29 DIAGNOSIS — R42 Dizziness and giddiness: Secondary | ICD-10-CM | POA: Diagnosis present

## 2016-02-29 DIAGNOSIS — I129 Hypertensive chronic kidney disease with stage 1 through stage 4 chronic kidney disease, or unspecified chronic kidney disease: Secondary | ICD-10-CM | POA: Insufficient documentation

## 2016-02-29 DIAGNOSIS — Z5181 Encounter for therapeutic drug level monitoring: Secondary | ICD-10-CM | POA: Insufficient documentation

## 2016-02-29 DIAGNOSIS — N183 Chronic kidney disease, stage 3 (moderate): Secondary | ICD-10-CM | POA: Insufficient documentation

## 2016-02-29 LAB — HEPATIC FUNCTION PANEL
ALT: 29 U/L (ref 17–63)
AST: 27 U/L (ref 15–41)
Albumin: 4.2 g/dL (ref 3.5–5.0)
Alkaline Phosphatase: 57 U/L (ref 38–126)
Bilirubin, Direct: <0.1 mg/dL — ABNORMAL LOW (ref 0.1–0.5)
Total Bilirubin: 0.6 mg/dL (ref 0.3–1.2)
Total Protein: 7 g/dL (ref 6.5–8.1)

## 2016-02-29 LAB — BASIC METABOLIC PANEL
Anion gap: 4 — ABNORMAL LOW (ref 5–15)
BUN: 23 mg/dL — ABNORMAL HIGH (ref 6–20)
CO2: 29 mmol/L (ref 22–32)
Calcium: 8.9 mg/dL (ref 8.9–10.3)
Chloride: 104 mmol/L (ref 101–111)
Creatinine, Ser: 1.63 mg/dL — ABNORMAL HIGH (ref 0.61–1.24)
GFR calc Af Amer: 43 mL/min — ABNORMAL LOW (ref >60)
GFR calc non Af Amer: 37 mL/min — ABNORMAL LOW (ref >60)
Glucose, Bld: 111 mg/dL — ABNORMAL HIGH (ref 65–99)
Potassium: 5.1 mmol/L (ref 3.5–5.1)
Sodium: 137 mmol/L (ref 135–145)

## 2016-02-29 LAB — CBC
HCT: 45.5 % (ref 40.0–52.0)
Hemoglobin: 16.1 g/dL (ref 13.0–18.0)
MCH: 32.8 pg (ref 26.0–34.0)
MCHC: 35.5 g/dL (ref 32.0–36.0)
MCV: 92.4 fL (ref 80.0–100.0)
Platelets: 154 10*3/uL (ref 150–440)
RBC: 4.92 MIL/uL (ref 4.40–5.90)
RDW: 14.2 % (ref 11.5–14.5)
WBC: 8.3 10*3/uL (ref 3.8–10.6)

## 2016-02-29 LAB — LACTIC ACID, PLASMA: Lactic Acid, Venous: 1 mmol/L (ref 0.5–1.9)

## 2016-02-29 LAB — TROPONIN I: Troponin I: <0.03 ng/mL (ref <0.03)

## 2016-02-29 LAB — BRAIN NATRIURETIC PEPTIDE: B Natriuretic Peptide: 103 pg/mL — ABNORMAL HIGH (ref 0.0–100.0)

## 2016-02-29 MED ORDER — SODIUM CHLORIDE 0.9 % IV BOLUS (SEPSIS)
1000.0000 mL | Freq: Once | INTRAVENOUS | Status: AC
Start: 2016-02-29 — End: 2016-02-29
  Administered 2016-02-29: 1000 mL via INTRAVENOUS

## 2016-02-29 NOTE — Telephone Encounter (Signed)
I spoke with Mr. Kristiansen regarding his symptoms after initiation of amiodarone yesterday. He took a single dose of 400 mg and felt very weak and lightheaded. Symptoms improved in the afternoon and evening. However, this morning he reports feeling very tired and lightheaded, making it difficult to walk to the bathroom. He has not had any falls. He checked his blood pressure earlier and noted that it was very elevated with a systolic blood pressure greater than 170 mmHg. At the time, he felt very tired. His heart rate was in the 80s. His blood pressure has since return to baseline, though he notes he continues to feel lightheaded.  I am not entirely sure what is causing his symptoms. He has had frequent PVCs on his EKG in the past, which could be contributing. He wore a Holter monitor earlier this week, but the results are not yet available. Given that he has considerable lightheadedness and fatigue in the setting of severe mitral regurgitation, I feel that it is best for him to be evaluated in the emergency department. He may benefit from admission with medication optimization and possible expedited evaluation by cardiothoracic surgery for mitral valve repair, based on his ED workup. I advised Mr. Braswell to have someone take him to the [facility] ED at this time. He voiced understanding.  [name], MD [facility] Pager: [phone]

## 2016-02-29 NOTE — ED Notes (Signed)
Pt. Verbalizes understanding of d/c instructions and follow-up. VS stable and pt. Denies pain.  Pt. In NAD at time of d/c and denies further concerns regarding this visit. Pt. Stable at the time of departure from the unit, departing unit by the safest and most appropriate manner per that pt condition and limitations. Pt advised to return to the ED at any time for emergent concerns, or for new/worsening symptoms.   

## 2016-02-29 NOTE — ED Notes (Signed)
MD at bedside. 

## 2016-02-29 NOTE — ED Notes (Signed)
Heart cath within the last week , was on a holter monitor earlier this week , this morning " my heart was beating really fast" , " I get swimmy headed when I do something.

## 2016-02-29 NOTE — ED Triage Notes (Addendum)
Pt has had dizziness for 2 weeks. Was having hypotension which led cardiac cath. Pt reports "leaky" valve and "extra beat" per cath; no blockage per pt. Felt like heart was beating fast today. Told to come to ED and get checked. NAD. No respiratory distress

## 2016-02-29 NOTE — ED Provider Notes (Signed)
[facility] Emergency Department Provider Note  Time seen: 3:26 PM  I have reviewed the triage vital signs and the nursing notes.   HISTORY  Chief Complaint Dizziness    HPI Roy Palmer is a 82 y.o. male with a past medical history of CK D, hyperlipidemia, hypertension, who presents to the emergency department for palpitations. According to the patient for the past several weeks or even months he has been getting episodes of dizziness or he feels off balance like the room is spinning. States he typically only occur when he gets up to walk around. He has been seen by his cardiologist Dr. end, who checked blood work, performed a cardiac catheterization 6 days ago, performed a Holter monitor 2 days ago. Patient states this morning he had an episode of dizziness associated with a feeling that his heart was beating very fast. He called his cardiologist who recommended that he come to the emergency department for evaluation. Here the patient states he has not had any further symptoms, denies any dizziness. Denies any chest pain or shortness of breath or nausea or diaphoresis now or at any time.  Past Medical History:  Diagnosis Date  . CKD (chronic kidney disease) stage 3, GFR 30-59 ml/min   . Heart murmur   . Hyperlipidemia   . Hypertension   . Hypertensive kidney disease   . Trigeminal neuralgia     Patient Active Problem List   Diagnosis Date Noted  . PVC (premature ventricular contraction) 02/24/2016  . Mitral regurgitation 02/23/2016  . Dizziness 02/23/2016    Past Surgical History:  Procedure Laterality Date  . CARDIAC CATHETERIZATION N/A 02/23/2016   Procedure: Right/Left Heart Cath and Coronary Angiography;  Surgeon: [name], MD;  Location: [facility] INVASIVE CV LAB;  Service: Cardiovascular;  Laterality: N/A;  . CATARACT EXTRACTION, BILATERAL    . COLONOSCOPY  08/2005  . CYST EXCISION  10/2013   from neck  . ESOPHAGOGASTRODUODENOSCOPY  02/2010   . TEE WITHOUT CARDIOVERSION N/A 02/23/2016   Procedure: TRANSESOPHAGEAL ECHOCARDIOGRAM (TEE);  Surgeon: Quintella Reichert, MD;  Location: [facility] ENDOSCOPY;  Service: Cardiovascular;  Laterality: N/A;    Prior to Admission medications   Medication Sig Start Date End Date Taking? Authorizing Provider  amiodarone (PACERONE) 200 MG tablet Take 400 mg twice a day for 10 days, then 200 mg daily thereafter. 02/23/16   [name], MD  aspirin EC 81 MG tablet Take 1 tablet (81 mg total) by mouth daily. 02/09/16   [name], MD  atorvastatin (LIPITOR) 20 MG tablet Take 1 tablet (20 mg total) by mouth daily. 02/23/16   [name], MD  ibuprofen (ADVIL,MOTRIN) 200 MG tablet Take 200-400 mg by mouth every 6 (six) hours as needed for headache or moderate pain.    Historical Provider, MD  omeprazole (PRILOSEC) 20 MG capsule Take 20 mg by mouth daily.    Historical Provider, MD    Allergies  Allergen Reactions  . Lyrica [Pregabalin] Nausea And Vomiting    Family History  Problem Relation Age of Onset  . Hypertension Mother   . Stroke Mother   . Heart disease Father   . Heart attack Father   . COPD Neg Hx   . Cancer Neg Hx   . Diabetes Neg Hx     Social History Social History  Substance Use Topics  . Smoking status: Never Smoker  . Smokeless tobacco: Never Used  . Alcohol use No    Review of Systems Constitutional: Negative  for fever Cardiovascular: Negative for chest pain.Positive for palpitations. Respiratory: Negative for shortness of breath. Gastrointestinal: Negative for abdominal pain Musculoskeletal: Negative for back pain. Neurological: Negative for headache 10-point ROS otherwise negative.  ____________________________________________   PHYSICAL EXAM:  VITAL SIGNS: ED Triage Vitals  Enc Vitals Group     BP 02/29/16 1127 (!) 149/89     Pulse Rate 02/29/16 1127 72     Resp 02/29/16 1127 18     Temp 02/29/16 1127 98.1 F (36.7 C)     Temp Source 02/29/16 1127  Oral     SpO2 02/29/16 1127 97 %     Weight 02/29/16 1127 192 lb (87.1 kg)     Height 02/29/16 1127 5\' 9"  (1.753 m)     Head Circumference --      Peak Flow --      Pain Score 02/29/16 1517 0     Pain Loc --      Pain Edu? --      Excl. in GC? --     Constitutional: Alert and oriented. Well appearing and in no distress. Eyes: Normal exam ENT   Head: Normocephalic and atraumatic.   Mouth/Throat: Mucous membranes are moist. Cardiovascular: Normal rate, regular rhythm. 2/6 systolic murmur Respiratory: Normal respiratory effort without tachypnea nor retractions. Breath sounds are clear  Gastrointestinal: Soft and nontender. No distention.   Musculoskeletal: Nontender with normal range of motion in all extremities. Neurologic:  Normal speech and language. No gross focal neurologic deficits Skin:  Skin is warm, dry and intact.  Psychiatric: Mood and affect are normal. Speech and behavior are normal.   ____________________________________________    EKG  EKG reviewed and interpreted by myself shows a normal sinus rhythm at 72 bpm, a widened QRS with left axis deviation, nonspecific ST changes without obvious ST elevation. Most consistent with right bundle branch block. Largely unchanged from 02/09/16  ____________________________________________    RADIOLOGY  Chest x-ray negative  ____________________________________________   INITIAL IMPRESSION / ASSESSMENT AND PLAN / ED COURSE  Pertinent labs & imaging results that were available during my care of the patient were reviewed by me and considered in my medical decision making (see chart for details).  The patient presents to the emergency department for palpitations and dizziness. He states the dizziness episodes have been occurring for months at this point. But this is the first time he had palpitations during an episode of dizziness. Patient has undergone extensive workup by his cardiologist including cardiac  catheterization, blood work and Holter monitor. Patient's labs today are largely at his baseline, troponin is negative. Chest x-ray is clear. EKG shows a bifascicular block with frequent PVCs on telemetry, but no overly concerning changes. We will discuss the patient with his cardiologist for further recommendations.   I discussed the patient with Dr. Okey DupreEnd of cardiology, who is reviewed the patient's lab work, recommends light IV hydration. Patient remains symptom free in the emergency department. He states he believes patient is likely safe for discharge home, agree with this. He will call the patient tomorrow to arrange follow-up. Patient [place] next [place] in [place] to discuss mitral valve repair. We will IV hydrate the patient in the emergency department, the continues to appear well with no concerning findings on telemetry patient will be discharged home with cardiology follow-up.  Labs are within normal limits. Discussed with cardiology. We'll discharge him with cardiology follow-up.  ____________________________________________   FINAL CLINICAL IMPRESSION(S) / ED DIAGNOSES  Palpitations Dizziness    Caryn BeeKevin  Arriyah Madej, MD 02/29/16 2041

## 2016-03-01 ENCOUNTER — Telehealth: Payer: Self-pay | Admitting: Internal Medicine

## 2016-03-01 NOTE — Telephone Encounter (Signed)
Patient now scheduled to see Dr. Cornelius Moras on Monday (10/2) for evaluation of MR.  Given significant PVC burden and NSVT, we have also agreed to try amiodarone again, starting at 100 mg daily with eventual uptitration as tolerated.  The patient is in agreement with this plan.  [name], MD [facility] Pager: [phone]

## 2016-03-01 NOTE — Telephone Encounter (Addendum)
I spoke with Roy Palmer to follow-up on his ED visit yesterday for dizziness and fatigue.  He had another episode of dizziness last night after eating.  No further episodes today but feels very fatigued when walking more than 100 feet.  Holter monitor shows frequent PVC's (11%) with multiple runs of non-sustained VT (up to 9 beats).  Patient did not tolerate amiodarone due to worsening dizziness and does not wish to try again.  In the ED yesterday, workup was unrevealing except for frequent PVC's on telemetry.  Additional medical options of PVC's is limited due to underlying bifascicular block.  I am hesitant to use flecainide as well.  We have agreed to avoid additional medications.  We will reassess his symptoms next week and try to move up his appointment with Dr. Cornelius Moras to discuss mitral valve intervention.  Patient was advised to seek immediate medical attention if his symptoms worsen.  [name], MD [facility] Pager: [phone]

## 2016-03-04 ENCOUNTER — Other Ambulatory Visit: Payer: Self-pay | Admitting: *Deleted

## 2016-03-04 ENCOUNTER — Encounter: Payer: Self-pay | Admitting: Thoracic Surgery (Cardiothoracic Vascular Surgery)

## 2016-03-04 ENCOUNTER — Institutional Professional Consult (permissible substitution) (INDEPENDENT_AMBULATORY_CARE_PROVIDER_SITE_OTHER): Payer: Medicare Other | Admitting: Thoracic Surgery (Cardiothoracic Vascular Surgery)

## 2016-03-04 VITALS — BP 120/62 | HR 47 | Resp 20 | Ht 69.0 in | Wt 192.0 lb

## 2016-03-04 DIAGNOSIS — I712 Thoracic aortic aneurysm, without rupture, unspecified: Secondary | ICD-10-CM

## 2016-03-04 DIAGNOSIS — R42 Dizziness and giddiness: Secondary | ICD-10-CM

## 2016-03-04 DIAGNOSIS — I472 Ventricular tachycardia, unspecified: Secondary | ICD-10-CM | POA: Insufficient documentation

## 2016-03-04 DIAGNOSIS — N289 Disorder of kidney and ureter, unspecified: Secondary | ICD-10-CM

## 2016-03-04 DIAGNOSIS — I340 Nonrheumatic mitral (valve) insufficiency: Secondary | ICD-10-CM

## 2016-03-04 DIAGNOSIS — I7101 Dissection of thoracic aorta: Secondary | ICD-10-CM

## 2016-03-04 DIAGNOSIS — I493 Ventricular premature depolarization: Secondary | ICD-10-CM | POA: Diagnosis not present

## 2016-03-04 DIAGNOSIS — I71019 Dissection of thoracic aorta, unspecified: Secondary | ICD-10-CM

## 2016-03-04 DIAGNOSIS — I4729 Other ventricular tachycardia: Secondary | ICD-10-CM

## 2016-03-04 NOTE — Progress Notes (Signed)
[address]       [name] [address]             [phone]     CARDIOTHORACIC SURGERY CONSULTATION REPORT  Referring Provider is End, Cristal Deer, MD PCP is Gabriel Cirri, NP  Chief Complaint  Patient presents with  . Mitral Regurgitation    Surgical eval, ECHO 02/14/16, TEE and Cardiac Cath 02/23/16    HPI:  Patient is an 82 year old male with history of heart murmur, hypertension, hyperlipidemia, stage III chronic kidney disease, and trigeminal neuralgia who has recently developed episodes of recurrent nonsustained ventricular tachycardia associated with physical exertion and has been referred for surgical consultation to discuss treatment options for management of severe symptomatic mitral regurgitation.  The patient states that he has known that he had a heart murmur for quite some time. He had never previously been evaluated by a cardiologist until recently. Approximately 6 weeks ago the patient first began to experience episodes of transient dizzy spells associated with shortness of breath and chest discomfort. The patient states that episodes are always associated with physical exertion. 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  The patient states that he can feel his heart racing, he develops discomfort across his chest associated with shortness of breath, and he becomes severely dizzy. He stops and lays down and usually within 5-10 minutes symptoms resolve completely. Over the past 6 weeks he has had numerous episodes which have been increasing in frequency. They are always brought on with physical exertion. The patient was referred to Dr. Okey Dupre for cardiology consultation and noted to have a prominent holosystolic murmur on physical exam.  Baseline EKGs revealed sinus rhythm with bifascicular block and PVCs. Transthoracic echocardiogram performed 02/14/2016 revealed normal left ventricular systolic  function with ejection fraction estimated 50%. There was moderate to severe mitral regurgitation with prolapse of the posterior leaflet. There was severe left atrial enlargement and moderate tricuspid regurgitation. TEE was recommended and performed on 02/23/2016. TEE confirmed the presence of degenerative mitral valve disease with mitral valve prolapse including a flail segment of the posterior leaflet with severe mitral regurgitation. There was systolic flow reversal in the pulmonary veins. Left ventricular function was felt to be mildly reduced. There was trivial aortic insufficiency. There was no thrombus in the left atrial appendage. There was mild to moderate tricuspid regurgitation.  Left and right heart catheterization was performed 02/23/2016. The patient was found to have mild nonobstructive coronary artery disease with left dominant coronary circulation. Pulmonary artery pressures were only mildly elevated. 24 hour Holter monitor was performed and revealed frequent PVCs and PACs with several runs of nonsustained ventricular tachycardia. bThe patient was referred for surgical consultation.  The patient is married and lives with his wife in [place], [place].  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 He has remained quite active physically LIFE. He enjoys hunting, fishing, and gardening. Up until recently he has had no significant physical limitations. He reports that over the past 6 weeks he has had symptoms of exertional shortness of breath associated with his paroxysmal attacks of palpitations and chest tightness with profound dizziness. He has not had any frank syncopal episodes although he states that he has come close on one or 2 occasions. Symptoms are always brought on with physical exertion. All episodes have self terminated in less than 10 minutes. The patient denies any  resting shortness of breath, PND, orthopnea, or lower extremity edema. Appetite  is normal. He has been getting more tired recently.   Past Medical History:  Diagnosis Date  . CKD (chronic kidney disease) stage 3, GFR 30-59 ml/min   . Heart murmur   . Hyperlipidemia   . Hypertension   . Hypertensive kidney disease   . Non-sustained ventricular tachycardia (HCC)   . Trigeminal neuralgia     Past Surgical History:  Procedure Laterality Date  . CARDIAC CATHETERIZATION N/A 02/23/2016   Procedure: Right/Left Heart Cath and Coronary Angiography;  Surgeon: [name], MD;  Location: [facility] INVASIVE CV LAB;  Service: Cardiovascular;  Laterality: N/A;  . CATARACT EXTRACTION, BILATERAL    . COLONOSCOPY  08/2005  . CYST EXCISION  10/2013   from neck  . ESOPHAGOGASTRODUODENOSCOPY  02/2010  . TEE WITHOUT CARDIOVERSION N/A 02/23/2016   Procedure: TRANSESOPHAGEAL ECHOCARDIOGRAM (TEE);  Surgeon: Quintella Reichert, MD;  Location: [facility] ENDOSCOPY;  Service: Cardiovascular;  Laterality: N/A;    Family History  Problem Relation Age of Onset  . Hypertension Mother   . Stroke Mother   . Heart disease Father   . Heart attack Father   . COPD Neg Hx   . Cancer Neg Hx   . Diabetes Neg Hx     Social History   Social History  . Marital status: Married    Spouse name: N/A  . Number of children: N/A  . Years of education: N/A   Occupational History  . Not on file.   Social History Main Topics  . Smoking status: Never Smoker  . Smokeless tobacco: Never Used  . Alcohol use No  . Drug use: No  . Sexual activity: Not on file   Other Topics Concern  . Not on file   Social History Narrative  . No narrative on file    Current Outpatient Prescriptions  Medication Sig Dispense Refill  . aspirin EC 81 MG tablet Take 1 tablet (81 mg total) by mouth daily.    Marland Kitchen ibuprofen (ADVIL,MOTRIN) 200 MG tablet Take 200-400 mg by mouth every 6 (six) hours as needed for headache or moderate pain.    Marland Kitchen omeprazole (PRILOSEC) 20 MG capsule Take 20 mg by mouth daily.     No current  facility-administered medications for this visit.     Allergies  Allergen Reactions  . Pacerone [Amiodarone] Other (See Comments)    Hallucinations  . Lyrica [Pregabalin] Nausea And Vomiting      Review of Systems:   General:  normal appetite, decreased energy, no weight gain, no weight loss, no fever  Cardiac:  + chest pain with exertion, NO chest pain at rest, + SOB with exertion, NO resting SOB, NO PND, NO orthopnea, + palpitations, + arrhythmia, no atrial fibrillation, no LE edema, + dizzy spells, no syncope  Respiratory:  + shortness of breath, no home oxygen, no productive cough, no dry cough, no bronchitis, no wheezing, no hemoptysis, no asthma, no pain with inspiration or cough, no sleep apnea, no CPAP at night  GI:   no difficulty swallowing, no reflux, no frequent heartburn, no hiatal hernia, no abdominal pain, no constipation, no diarrhea, no hematochezia, no hematemesis, no melena  GU:   no dysuria,  no frequency, no urinary tract infection, no hematuria, no enlarged prostate, no kidney stones, + kidney disease  Vascular:  no pain suggestive of claudication, no pain in feet, no leg cramps, no varicose veins, no DVT, no non-healing foot ulcer  Neuro:   no stroke, no TIA's, no seizures, no headaches, no temporary blindness one eye,  no slurred speech, no peripheral neuropathy, no chronic pain, no instability of gait, no memory/cognitive dysfunction  Musculoskeletal: mild arthritis, no joint swelling, no myalgias, no difficulty walking, normal mobility   Skin:   no rash, no itching, no skin infections, no pressure sores or ulcerations  Psych:   no anxiety, no depression, no nervousness, no unusual recent stress  Eyes:   no blurry vision, no floaters, no recent vision changes, + wears glasses or contacts  ENT:   no hearing loss, no loose or painful teeth, no dentures, last saw dentist within the past 6 months  Hematologic:  no easy bruising, no abnormal bleeding, no clotting  disorder, no frequent epistaxis  Endocrine:  no diabetes, does not check CBG's at home     Physical Exam:   BP 120/62 (BP Location: Right Arm, Patient Position: Sitting, Cuff Size: Normal)   Pulse (!) 47 Comment: irregular  Resp 20   Ht 5\' 9"  (1.753 m)   Wt 192 lb (87.1 kg)   SpO2 98% Comment: RA  BMI 28.35 kg/m   General:    well-appearing  HEENT:  Unremarkable   Neck:   no JVD, no bruits, no adenopathy   Chest:   clear to auscultation, symmetrical breath sounds, no wheezes, no rhonchi   CV:   RRR, grade III/VI holosystolic murmur best LLSB  Abdomen:  soft, non-tender, no masses   Extremities:  warm, well-perfused, pulses diminished but palpable, no LE edema  Rectal/GU  Deferred  Neuro:   Grossly non-focal and symmetrical throughout  Skin:   Clean and dry, no rashes, no breakdown   Diagnostic Tests:  Transthoracic Echocardiography  Patient:    Naman, Spychalski MR #:       [id number] Study Date: 02/14/2016 Gender:     M Age:        28 Height:     175.3 cm Weight:     88.4 kg BSA:        2.09 m^2 Pt. Status: Room:   ATTENDING    Default, Provider [phone]  ORDERING     [name], MD  REFERRING    [name], MD  PERFORMING   [facility] [place]  SONOGRAPHER  Elmarie Shiley, RVT  cc:  ------------------------------------------------------------------- LV EF: 50% -   55%  ------------------------------------------------------------------- History:   PMH:   Lightheadedness and murmur.  Risk factors: Hypertension. Dyslipidemia.  ------------------------------------------------------------------- Study Conclusions  - Procedure narrative: Transthoracic echocardiography. Image   quality was fair. The study was technically difficult. - Left ventricle: The cavity size was normal. Wall thickness was   increased in a pattern of moderate LVH. Systolic function was   normal. The estimated ejection fraction was mildly reduced  50%   . There is possible  hypokinesis of the inferolateral segment. The   study is not technically sufficient to allow evaluation of LV   diastolic function. - Mitral valve: There was moderate to severe regurgitation,   anteriorly directed. There appears to be prolapse of the   posterior mitral leaflet; can not rule out flail. If indicated,   TEE may provide more information. - Left atrium: The atrium was severely dilated. - Tricuspid valve: There was moderate regurgitation. - Results relayed to ordering physician.  ------------------------------------------------------------------- Labs, prior tests, procedures, and surgery: ECG.     Abnormal.  ------------------------------------------------------------------- Study data:  No prior study was available for comparison.  Study status:  Routine.  Procedure:  Transthoracic echocardiography. Image quality was fair. The study was technically difficult.  Study completion:  There were no complications.          Transthoracic echocardiography.  M-mode, complete 2D, spectral Doppler, and color Doppler.  Birthdate:  Patient birthdate: 1930/01/05.  Age:  Patient is 82 yr old.  Sex:  Gender: male.    BMI: 28.8 kg/m^2.  Blood pressure:     140/80  Patient status:  Outpatient.  Study date: Study date: 02/14/2016. Study time: 07:31 AM.  -------------------------------------------------------------------  ------------------------------------------------------------------- Left ventricle:  The cavity size was normal. Wall thickness was increased in a pattern of moderate LVH. Systolic function was normal. The estimated ejection fraction was mildly reduced  50% . There is possible hypokinesis of the inferolateral segment. The study is not technically sufficient to allow evaluation of LV diastolic function.  ------------------------------------------------------------------- Aortic valve:   Trileaflet; mildly thickened leaflets.  Doppler: There was no stenosis.   There  was no significant regurgitation. Mean gradient (S): 2 mm Hg. Peak gradient (S): 4 mm Hg.  ------------------------------------------------------------------- Aorta:  Aortic root: The aortic root was normal in size.  ------------------------------------------------------------------- Mitral valve:   Doppler:  There was moderate to severe regurgitation, anteriorly directed. There appears to be prolapse of the posterior mitral leaflet; can not rule out flail. If indicated, TEE may provide more information.    Peak gradient (D): 5 mm Hg.   ------------------------------------------------------------------- Left atrium:  The atrium was severely dilated.  ------------------------------------------------------------------- Right ventricle:  The cavity size was normal. Systolic function was normal.  ------------------------------------------------------------------- Pulmonic valve:   Poorly visualized.  Doppler:  There was no significant regurgitation.  ------------------------------------------------------------------- Tricuspid valve:   Structurally normal valve.   Leaflet separation was normal.  Doppler:  Transvalvular velocity was within the normal range. There was moderate regurgitation.  ------------------------------------------------------------------- Pulmonary artery:   Systolic pressure was within the normal range.   ------------------------------------------------------------------- Right atrium:  The atrium was normal in size.  ------------------------------------------------------------------- Pericardium:  There was no pericardial effusion.  ------------------------------------------------------------------- Measurements   Left ventricle                           Value        Reference  LV ID, ED, PLAX chordal                  51.8  mm     43 - 52  LV ID, ES, PLAX chordal                  35.1  mm     23 - 38  LV fx shortening, PLAX chordal           32     %      >=29  LV PW thickness, ED                      14.1  mm     ---------  IVS/LV PW ratio, ED              (H)     1.44         <=1.3  LV ejection fraction, 1-p A4C            50    %      ---------  LV end-diastolic volume, 2-p             101   ml     ---------  LV end-systolic volume, 2-p              50    ml     ---------  LV ejection fraction, 2-p                51    %      ---------  Stroke volume, 2-p                       51    ml     ---------  LV end-diastolic volume/bsa, 2-p         48    ml/m^2 ---------  LV end-systolic volume/bsa, 2-p          24    ml/m^2 ---------  Stroke volume/bsa, 2-p                   24.3  ml/m^2 ---------  LV e&', lateral                           14.1  cm/s   ---------  LV E/e&', lateral                         7.73         ---------  LV e&', medial                            7.83  cm/s   ---------  LV E/e&', medial                          13.92        ---------  LV e&', average                           10.97 cm/s   ---------  LV E/e&', average                         9.94         ---------    Ventricular septum                       Value        Reference  IVS thickness, ED                        20.3  mm     ---------    LVOT                                     Value        Reference  LVOT ID, S                               20    mm     ---------  LVOT area                                3.14  cm^2   ---------  LVOT ID  20    mm     ---------    Aortic valve                             Value        Reference  Aortic valve peak velocity, S            105   cm/s   ---------  Aortic valve mean velocity, S            65.9  cm/s   ---------  Aortic valve VTI, S                      19.9  cm     ---------  Aortic mean gradient, S                  2     mm Hg  ---------  Aortic peak gradient, S                  4     mm Hg  ---------    Aorta                                    Value        Reference  Aortic  root ID, ED                       37    mm     ---------    Left atrium                              Value        Reference  LA ID, A-P, ES                           47    mm     ---------  LA ID/bsa, A-P                   (H)     2.24  cm/m^2 <=2.2  LA volume, S                             101   ml     ---------  LA volume/bsa, S                         48.2  ml/m^2 ---------  LA volume, ES, 1-p A4C                   123   ml     ---------  LA volume/bsa, ES, 1-p A4C               58.7  ml/m^2 ---------  LA volume, ES, 1-p A2C                   77.3  ml     ---------  LA volume/bsa, ES, 1-p A2C               36.9  ml/m^2 ---------    Mitral valve  Value        Reference  Mitral E-wave peak velocity              109   cm/s   ---------  Mitral A-wave peak velocity              93    cm/s   ---------  Mitral deceleration time                 215   ms     150 - 230  Mitral peak gradient, D                  5     mm Hg  ---------  Mitral E/A ratio, peak                   1.2          ---------  Mitral regurg VTI, PISA                  202   cm     ---------    Right ventricle                          Value        Reference  TAPSE                                    16.5  mm     ---------    Pulmonic valve                           Value        Reference  Pulmonic valve peak velocity, S          102   cm/s   ---------  Legend: (L)  and  (H)  mark values outside specified reference range.  ------------------------------------------------------------------- Prepared and Electronically Authenticated by  Almond Lint, MD 2017-09-13T13:06:11   Transesophageal Echocardiography  Patient:    Justus, Droke MR #:       [id number] Study Date: 02/23/2016 Gender:     M Age:        3 Height:     175.3 cm Weight:     87.2 kg BSA:        2.08 m^2 Pt. Status: Room:   ADMITTING  [name], MD  ATTENDING  [name], MD  ORDERING   [name], MD  REFERRING  [name], MD  cc:  -------------------------------------------------------------------  ------------------------------------------------------------------- Study Conclusions  - Left ventricle: The cavity size was normal. Wall thickness was   normal. Systolic function was at the lower limits of normal. - Aortic valve: There was trivial regurgitation. - Mitral valve: There was severe regurgitation directed   eccentrically. Severe regurgitation is suggested by pulmonary   vein systolic flow reversal. - Left atrium: No evidence of thrombus in the atrial cavity or   appendage. - Atrial septum: There was increased thickness of the septum,   consistent with lipomatous hypertrophy. - Tricuspid valve: There was mild-moderate regurgitation. - Pulmonary arteries: The main pulmonary artery was mildly dilated.  Impressions:  - Degenerative mitral valve disease with flail posterior leaflet   (P2 segment) and severe regurgitation.  Recommendations:  Outpatient cardiac surgery consultation for mitral valve repair.  ------------------------------------------------------------------- Study data:   Procedure:  Initial setup. The patient was brought to the laboratory. Surface ECG leads were monitored. Sedation. Conscious sedation was administered. Transesophageal echocardiography. Topical anesthesia was obtained using viscous lidocaine. A transesophageal probe was inserted by the attending cardiologist. Image quality was adequate. Dr. Mayford Knife was present for the entire procedure as proctor.  Study completion:  The patient tolerated the procedure well. There were no complications.         Diagnostic transesophageal echocardiography.  2D and color Doppler.  Birthdate:  Patient birthdate: 09/22/1929.  Age:  Patient is 82 yr old.  Sex:  Gender: male.    BMI: 28.4 kg/m^2.  Study date:  Study date: 02/23/2016. Study time: 12:01  PM.  -------------------------------------------------------------------  ------------------------------------------------------------------- Left ventricle:  The cavity size was normal. Wall thickness was normal. Systolic function was at the lower limits of normal.  ------------------------------------------------------------------- Aortic valve:   Structurally normal valve.   Cusp separation was normal.  Doppler:  There was trivial regurgitation.  ------------------------------------------------------------------- Aorta:  The aorta was not dilated and mildly diseased.  ------------------------------------------------------------------- Mitral valve:  There is choral rupture with a flail P2 segment. Doppler parameters likely underestimate severity of regurgitation due to eccentric jet.  Doppler:  There was severe regurgitation directed eccentrically. Severe regurgitation is suggested by pulmonary vein systolic flow reversal.  ------------------------------------------------------------------- Left atrium:   No evidence of thrombus in the atrial cavity or appendage.  ------------------------------------------------------------------- Atrial septum:  No PFO or ASD identified by color Doppler. Bubble study was not performed. There was increased thickness of the septum, consistent with lipomatous hypertrophy.  ------------------------------------------------------------------- Right ventricle:  The cavity size was normal. Wall thickness was normal. Systolic function was normal.  ------------------------------------------------------------------- Pulmonic valve:    Structurally normal valve.   Cusp separation was normal.  Doppler:  There was mild regurgitation.  ------------------------------------------------------------------- Tricuspid valve:   Structurally normal valve.   Leaflet separation was normal.  Doppler:  There was mild-moderate  regurgitation.  ------------------------------------------------------------------- Pulmonary artery:   The main pulmonary artery was mildly dilated.   ------------------------------------------------------------------- Right atrium:  The atrium was normal in size.   ------------------------------------------------------------------- Post procedure conclusions Ascending Aorta:  - The aorta was not dilated and mildly diseased.  ------------------------------------------------------------------- Measurements   Mitral valve                      Value  Mitral regurg vena contracta      7     mm  Mitral regurg VTI, PISA           192   cm  Mitral ERO, PISA                  0.27  cm^2  Mitral regurg volume, PISA        52    ml  Legend: (L)  and  (H)  mark values outside specified reference range.  ------------------------------------------------------------------- Prepared and Electronically Authenticated by  [name], MD 2017-09-22T17:43:41   Right/Left Heart Cath and Coronary Angiography  Conclusion   Conclusions: 1. Mild, non-obstructive coronary artery disease. 2. Normal left and right heart filling pressures. 3. Decreased Fick cardiac output in the setting of frequent PVC's and severe mitral regurgitation by TEE.  Recommendations: 1. Initiate amiodarone for frequent PVC's.  Will arrange for 24 hour Holter monitor and PFT's before starting medication. 2. Outpatient cardiac surgery evaluation for mitral valve repair. 3. Start atorvastatin 20 mg daily given mild CAD.  Indications   Mitral regurgitation [I34.0 (ICD-10-CM)]  Dizziness [R42 (ICD-10-CM)]  PVC (premature ventricular contraction) [I49.3 (ICD-10-CM)]  Procedural Details/Technique   Technical Details Indication: Mr. Biskup is an 82 year-old man with no significant PMH, who presents for evaluation of progressive exertional dizziness and fatigue x 1 month. EKG was notable for frequent PVC's;  TTE and TEE showed low-normal LVEF with severe MR. He has therefore been referred for left and right heart catheterization.  Procedure: The risks, benefits, complications, treatment options, and expected outcomes were discussed with the patient. The patient and/or family concurred with the proposed plan, giving informed consent. The patient was brought to the cath lab after IV hydration was begun and oral premedication was given. The patient was further sedated with Versed and Fentanyl. The right wrist was assessed with a modified Allens test which was normal. The right wrist and elbow were prepped and draped in a sterile fashion. 1% lidocaine was used for local anesthesia. Via the indwelling right antecubital IV, a 664F balloon-tipped catheter was advanced through the right heart chambers into the pulmonary capillary wedge position. PA oxygen saturations were obtained. Using the modified Seldinger access technique, a 6 French Terumo slender Glidesheath was placed in the right radial artery. 3 mg Verapamil was given through the sheath. Heparin 4000 units were administered.  Left heart catheterization was performed by crossing the aortic valve with a 664F JR4 catheter. LVEDP was obtained with simultaneous right-heart pressures. Pullback across the aortic valve was also performed. Selective coronary angiography was performed using 664F JL3.5 and JR4 catheters to engage the left and right coronary arteries, respectively.  At the end of the procedure, the radial artery sheath was removed and a TR band applied to achieve patent hemostasis. There were no immediate complications. The patient was taken to the recovery area in stable condition.    Estimated blood loss <50 mL. . During this procedure the patient was administered the following to achieve and maintain moderate conscious sedation: Versed 1 mg, Fentanyl 25 mcg, while the patient's heart rate, blood pressure, and oxygen saturation were continuously monitored.  The period of conscious sedation was 35 minutes, of which I was present face-to-face 100% of this time.    Complications   Complications documented before study signed (02/23/2016 3:21 PM EDT)    No complications were associated with this study.  Documented by [name], MD - 02/23/2016 3:20 PM EDT    Coronary Findings   Dominance: Left  Left Main  Vessel is large.  Ost LM lesion, 30% stenosed.  Left Anterior Descending  Vessel is large.  Ost LAD to Prox LAD lesion, 30% stenosed. The lesion is eccentric. The lesion is calcified.  First Diagonal Branch  Vessel is moderate in size.  Second Diagonal Branch  Vessel is small in size.  Ramus Intermedius  Vessel is moderate in size.  Ost Ramus lesion, 30% stenosed.  Lateral Ramus Intermedius  Vessel is moderate in size.  Left Circumflex  The vessel exhibits minimal luminal irregularities.  First Obtuse Marginal Branch  Vessel is small in size.  Second Obtuse Marginal Branch  Vessel is moderate in size.  Third Obtuse Marginal Branch  Vessel is small in size.  Fourth Obtuse Marginal Branch  Vessel is moderate in size.  Left Posterior Descending Artery  Vessel is moderate in size.  Right Coronary Artery  Vessel is small.  Right Heart   Right Heart Pressures Normal right heart filling pressure (mean RA 5 mmHg). Upper normal pulmonary artery pressure (mean PA 20 mmHg). Normal left heart filling pressure (mean PCWP 12  mmHg, LVEDP 12 mmHg). Decreased Fick cardiac output (CO 3.6 L/min, CI 1.8 L/min/m^2).    Left Heart   Left Ventricle LV end diastolic pressure is normal.    Coronary Diagrams   Diagnostic Diagram     Implants     No implant documentation for this case.  PACS Images   Show images for Cardiac catheterization   Link to Procedure Log   Procedure Log    Hemo Data   Flowsheet Row Most Recent Value  Fick Cardiac Output 3.55 L/min  Fick Cardiac Output Index 1.75 (L/min)/BSA  RA A Wave 12 mmHg   RA V Wave 8 mmHg  RA Mean 6 mmHg  RV Systolic Pressure 33 mmHg  RV Diastolic Pressure 4 mmHg  RV EDP 7 mmHg  PA Systolic Pressure 36 mmHg  PA Diastolic Pressure 14 mmHg  PA Mean 22 mmHg  PW A Wave 27 mmHg  PW V Wave 25 mmHg  PW Mean 16 mmHg  AO Systolic Pressure 119 mmHg  AO Diastolic Pressure 58 mmHg  AO Mean 89 mmHg  LV Systolic Pressure 112 mmHg  LV Diastolic Pressure 1 mmHg  LV EDP 17 mmHg  Arterial Occlusion Pressure Extended Systolic Pressure 116 mmHg  Arterial Occlusion Pressure Extended Diastolic Pressure 50 mmHg  Arterial Occlusion Pressure Extended Mean Pressure 72 mmHg  Left Ventricular Apex Extended Systolic Pressure 107 mmHg  Left Ventricular Apex Extended Diastolic Pressure 5 mmHg  Left Ventricular Apex Extended EDP Pressure 12 mmHg  QP/QS 1  TPVR Index 10.29 HRUI  TSVR Index 50.85 HRUI  PVR SVR Ratio 0.08  TPVR/TSVR Ratio 0.2      Impression:  Patient has stage D severe symptomatic primary mitral regurgitation. He presents with recent onset symptoms of recurrent dizzy spells associated with paroxysmal episodes of palpitations, chest pain and shortness of breath that are always brought on with physical exertion. 24 hour Holter monitor demonstrates recurrent episodes of nonsustained ventricular tachycardia. I have personally reviewed the patient's recent echocardiograms and diagnostic cardiac catheterization. 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 Left ventricular function is mildly reduced. Diagnostic cardiac catheterization is notable for the absence of significant coronary artery disease and only mildly elevated pulmonary artery pressures. I agree the patient needs mitral valve repair.  Baseline EKG reveals sinus bradycardia with bifascicular block. The patient has  been intolerant of amiodarone as prescribed over the past few weeks. The patient may be at significant risk for the development of increased AV block and/or tachybradycardia syndrome in the perioperative setting and may ultimately require permanent pacemaker placement.   Plan:  The patient was counseled at length regarding the indications, risks and potential benefits of mitral valve repair.  The rationale for elective surgery has been explained, including a comparison between surgery and continued medical therapy with close follow-up.  The likelihood of successful and durable valve repair has been discussed with particular reference to the findings of their recent echocardiogram.  Based upon these findings and previous experience, I have quoted them a greater than 90 percent likelihood of successful valve repair.  Expectations for the patient's postoperative convalescence of been discussed. Alternative surgical approaches of been discussed including a comparison between conventional sternotomy and minimally invasive techniques. The patient is interested in proceeding with surgery as soon as possible.   He will undergo CT angiography to further evaluate the feasibility  of peripheral cannulation for surgery. We tentatively plan to proceed with surgery on 02/25/2016. The patient will return to our office for follow-up prior to surgery on 03/18/2016.   I spent in excess of 90 minutes during the conduct of this office consultation and >50% of this time involved direct face-to-face encounter with the patient for counseling and/or coordination of their care.    Salvatore Decent. Cornelius Moras, MD 03/04/2016 12:20 PM

## 2016-03-04 NOTE — Patient Instructions (Signed)
Continue all previous medications without any changes at this time  

## 2016-03-06 ENCOUNTER — Ambulatory Visit: Payer: Medicare Other | Admitting: Internal Medicine

## 2016-03-06 ENCOUNTER — Telehealth: Payer: Self-pay | Admitting: Internal Medicine

## 2016-03-06 NOTE — Telephone Encounter (Signed)
I spoke with patient regarding his symptoms, as he canceled today's clinic visit. He continues to have intermittent dizziness, most recently yesterday. He was evaluated by Dr. Cornelius Moras on Monday and is now scheduled for mitral valve repair later this month. I advised the patient to limit his activities and to call [phone] if he has persistent lightheadedness or syncope. He voiced understanding of this plan.  [name], MD [facility] Pager: [phone]

## 2016-03-12 ENCOUNTER — Encounter (HOSPITAL_COMMUNITY): Payer: Self-pay

## 2016-03-12 ENCOUNTER — Ambulatory Visit
Admission: RE | Admit: 2016-03-12 | Discharge: 2016-03-12 | Disposition: A | Discharge status: Home / Self Care | Payer: Medicare Other | Source: Ambulatory Visit | Attending: Internal Medicine | Admitting: Internal Medicine

## 2016-03-12 ENCOUNTER — Inpatient Hospital Stay (HOSPITAL_COMMUNITY)
Admission: AD | Admit: 2016-03-12 | Discharge: 2016-03-29 | DRG: 219 | Disposition: A | Discharge status: Home / Self Care | Payer: Medicare Other | Source: Ambulatory Visit | Attending: Internal Medicine | Admitting: Internal Medicine

## 2016-03-12 ENCOUNTER — Telehealth: Payer: Self-pay | Admitting: Internal Medicine

## 2016-03-12 DIAGNOSIS — N183 Chronic kidney disease, stage 3 unspecified: Secondary | ICD-10-CM | POA: Diagnosis present

## 2016-03-12 DIAGNOSIS — I471 Supraventricular tachycardia: Secondary | ICD-10-CM

## 2016-03-12 DIAGNOSIS — Z7901 Long term (current) use of anticoagulants: Secondary | ICD-10-CM

## 2016-03-12 DIAGNOSIS — I5032 Chronic diastolic (congestive) heart failure: Secondary | ICD-10-CM | POA: Diagnosis not present

## 2016-03-12 DIAGNOSIS — I340 Nonrheumatic mitral (valve) insufficiency: Secondary | ICD-10-CM | POA: Diagnosis present

## 2016-03-12 DIAGNOSIS — Y92239 Unspecified place in hospital as the place of occurrence of the external cause: Secondary | ICD-10-CM | POA: Diagnosis not present

## 2016-03-12 DIAGNOSIS — I452 Bifascicular block: Secondary | ICD-10-CM | POA: Diagnosis not present

## 2016-03-12 DIAGNOSIS — Z952 Presence of prosthetic heart valve: Secondary | ICD-10-CM

## 2016-03-12 DIAGNOSIS — J9589 Other postprocedural complications and disorders of respiratory system, not elsewhere classified: Secondary | ICD-10-CM | POA: Diagnosis not present

## 2016-03-12 DIAGNOSIS — G500 Trigeminal neuralgia: Secondary | ICD-10-CM | POA: Diagnosis present

## 2016-03-12 DIAGNOSIS — R911 Solitary pulmonary nodule: Secondary | ICD-10-CM

## 2016-03-12 DIAGNOSIS — I472 Ventricular tachycardia, unspecified: Secondary | ICD-10-CM

## 2016-03-12 DIAGNOSIS — R001 Bradycardia, unspecified: Secondary | ICD-10-CM | POA: Diagnosis not present

## 2016-03-12 DIAGNOSIS — Z8249 Family history of ischemic heart disease and other diseases of the circulatory system: Secondary | ICD-10-CM

## 2016-03-12 DIAGNOSIS — Z79899 Other long term (current) drug therapy: Secondary | ICD-10-CM | POA: Diagnosis not present

## 2016-03-12 DIAGNOSIS — J9811 Atelectasis: Secondary | ICD-10-CM | POA: Diagnosis not present

## 2016-03-12 DIAGNOSIS — Z9889 Other specified postprocedural states: Secondary | ICD-10-CM

## 2016-03-12 DIAGNOSIS — Z8674 Personal history of sudden cardiac arrest: Secondary | ICD-10-CM | POA: Diagnosis not present

## 2016-03-12 DIAGNOSIS — I712 Thoracic aortic aneurysm, without rupture, unspecified: Secondary | ICD-10-CM | POA: Diagnosis present

## 2016-03-12 DIAGNOSIS — I511 Rupture of chordae tendineae, not elsewhere classified: Secondary | ICD-10-CM | POA: Diagnosis present

## 2016-03-12 DIAGNOSIS — R55 Syncope and collapse: Secondary | ICD-10-CM | POA: Diagnosis present

## 2016-03-12 DIAGNOSIS — I4891 Unspecified atrial fibrillation: Secondary | ICD-10-CM | POA: Diagnosis not present

## 2016-03-12 DIAGNOSIS — I509 Heart failure, unspecified: Secondary | ICD-10-CM

## 2016-03-12 DIAGNOSIS — I493 Ventricular premature depolarization: Secondary | ICD-10-CM | POA: Diagnosis present

## 2016-03-12 DIAGNOSIS — I130 Hypertensive heart and chronic kidney disease with heart failure and stage 1 through stage 4 chronic kidney disease, or unspecified chronic kidney disease: Secondary | ICD-10-CM | POA: Diagnosis not present

## 2016-03-12 DIAGNOSIS — Z01810 Encounter for preprocedural cardiovascular examination: Secondary | ICD-10-CM | POA: Diagnosis not present

## 2016-03-12 DIAGNOSIS — I714 Abdominal aortic aneurysm, without rupture, unspecified: Secondary | ICD-10-CM | POA: Diagnosis present

## 2016-03-12 DIAGNOSIS — Z823 Family history of stroke: Secondary | ICD-10-CM | POA: Diagnosis not present

## 2016-03-12 DIAGNOSIS — Y838 Other surgical procedures as the cause of abnormal reaction of the patient, or of later complication, without mention of misadventure at the time of the procedure: Secondary | ICD-10-CM | POA: Diagnosis not present

## 2016-03-12 DIAGNOSIS — I350 Nonrheumatic aortic (valve) stenosis: Secondary | ICD-10-CM | POA: Diagnosis not present

## 2016-03-12 DIAGNOSIS — Z7982 Long term (current) use of aspirin: Secondary | ICD-10-CM

## 2016-03-12 DIAGNOSIS — I1310 Hypertensive heart and chronic kidney disease without heart failure, with stage 1 through stage 4 chronic kidney disease, or unspecified chronic kidney disease: Secondary | ICD-10-CM | POA: Diagnosis present

## 2016-03-12 DIAGNOSIS — D6959 Other secondary thrombocytopenia: Secondary | ICD-10-CM | POA: Diagnosis present

## 2016-03-12 DIAGNOSIS — I081 Rheumatic disorders of both mitral and tricuspid valves: Principal | ICD-10-CM | POA: Diagnosis present

## 2016-03-12 DIAGNOSIS — D62 Acute posthemorrhagic anemia: Secondary | ICD-10-CM | POA: Diagnosis not present

## 2016-03-12 DIAGNOSIS — R443 Hallucinations, unspecified: Secondary | ICD-10-CM | POA: Diagnosis present

## 2016-03-12 DIAGNOSIS — I2510 Atherosclerotic heart disease of native coronary artery without angina pectoris: Secondary | ICD-10-CM | POA: Diagnosis present

## 2016-03-12 DIAGNOSIS — N289 Disorder of kidney and ureter, unspecified: Secondary | ICD-10-CM

## 2016-03-12 DIAGNOSIS — E785 Hyperlipidemia, unspecified: Secondary | ICD-10-CM | POA: Diagnosis present

## 2016-03-12 DIAGNOSIS — Z9189 Other specified personal risk factors, not elsewhere classified: Secondary | ICD-10-CM

## 2016-03-12 DIAGNOSIS — Z95810 Presence of automatic (implantable) cardiac defibrillator: Secondary | ICD-10-CM

## 2016-03-12 DIAGNOSIS — I313 Pericardial effusion (noninflammatory): Secondary | ICD-10-CM | POA: Diagnosis not present

## 2016-03-12 HISTORY — DX: Syncope and collapse: R55

## 2016-03-12 HISTORY — DX: Abdominal aortic aneurysm, without rupture: I71.4

## 2016-03-12 HISTORY — DX: Solitary pulmonary nodule: R91.1

## 2016-03-12 HISTORY — DX: Nonrheumatic mitral (valve) insufficiency: I34.0

## 2016-03-12 HISTORY — DX: Thoracic aortic aneurysm, without rupture: I71.2

## 2016-03-12 HISTORY — DX: Thoracic aortic aneurysm, without rupture, unspecified: I71.20

## 2016-03-12 HISTORY — DX: Abdominal aortic aneurysm, without rupture, unspecified: I71.40

## 2016-03-12 HISTORY — DX: Essential (primary) hypertension: I10

## 2016-03-12 HISTORY — DX: Other specified postprocedural states: Z98.890

## 2016-03-12 LAB — CBC
HCT: 44.3 % (ref 39.0–52.0)
Hemoglobin: 15 g/dL (ref 13.0–17.0)
MCH: 31.3 pg (ref 26.0–34.0)
MCHC: 33.9 g/dL (ref 30.0–36.0)
MCV: 92.3 fL (ref 78.0–100.0)
Platelets: 149 10*3/uL — ABNORMAL LOW (ref 150–400)
RBC: 4.8 MIL/uL (ref 4.22–5.81)
RDW: 13.8 % (ref 11.5–15.5)
WBC: 7.9 10*3/uL (ref 4.0–10.5)

## 2016-03-12 LAB — PROTIME-INR
INR: 0.99
Prothrombin Time: 13.1 seconds (ref 11.4–15.2)

## 2016-03-12 LAB — CREATININE, SERUM
Creatinine, Ser: 1.35 mg/dL — ABNORMAL HIGH (ref 0.61–1.24)
GFR calc Af Amer: 54 mL/min — ABNORMAL LOW (ref >60)
GFR calc non Af Amer: 47 mL/min — ABNORMAL LOW (ref >60)

## 2016-03-12 LAB — TSH: TSH: 1.554 u[IU]/mL (ref 0.350–4.500)

## 2016-03-12 LAB — BRAIN NATRIURETIC PEPTIDE: B Natriuretic Peptide: 110.8 pg/mL — ABNORMAL HIGH (ref 0.0–100.0)

## 2016-03-12 MED ORDER — ONDANSETRON HCL 4 MG/2ML IJ SOLN: 4.0000 mg | Freq: Four times a day (QID) | INTRAMUSCULAR | Status: DC | PRN

## 2016-03-12 MED ORDER — SODIUM BICARBONATE 8.4 % IV SOLN
INTRAVENOUS | Status: AC
  Administered 2016-03-12: 16:00:00 via INTRAVENOUS
  Filled 2016-03-12: qty 500

## 2016-03-12 MED ORDER — SODIUM CHLORIDE 0.9% FLUSH: 3.0000 mL | INTRAVENOUS | Status: DC | PRN

## 2016-03-12 MED ORDER — SODIUM CHLORIDE 0.9 % IV SOLN
250.0000 mL | INTRAVENOUS | Status: DC | PRN
  Administered 2016-03-19: 08:00:00 via INTRAVENOUS

## 2016-03-12 MED ORDER — SODIUM BICARBONATE BOLUS VIA INFUSION
INTRAVENOUS | Status: AC
  Administered 2016-03-12: 275 meq via INTRAVENOUS
  Filled 2016-03-12: qty 1

## 2016-03-12 MED ORDER — HEPARIN SODIUM (PORCINE) 5000 UNIT/ML IJ SOLN
5000.0000 [IU] | Freq: Three times a day (TID) | INTRAMUSCULAR | Status: DC
  Administered 2016-03-12 – 2016-03-18 (×19): 5000 [IU] via SUBCUTANEOUS
  Filled 2016-03-12 (×18): qty 1

## 2016-03-12 MED ORDER — ACETAMINOPHEN 325 MG PO TABS: 650.0000 mg | ORAL_TABLET | ORAL | Status: DC | PRN

## 2016-03-12 MED ORDER — ASPIRIN EC 81 MG PO TBEC
81.0000 mg | DELAYED_RELEASE_TABLET | Freq: Every day | ORAL | Status: DC
  Administered 2016-03-13 – 2016-03-18 (×6): 81 mg via ORAL
  Filled 2016-03-12 (×6): qty 1

## 2016-03-12 MED ORDER — PANTOPRAZOLE SODIUM 40 MG PO TBEC
40.0000 mg | DELAYED_RELEASE_TABLET | Freq: Every day | ORAL | Status: DC
  Administered 2016-03-13 – 2016-03-18 (×6): 40 mg via ORAL
  Filled 2016-03-12 (×6): qty 1

## 2016-03-12 MED ORDER — SODIUM CHLORIDE 0.9% FLUSH
3.0000 mL | Freq: Two times a day (BID) | INTRAVENOUS | Status: DC
  Administered 2016-03-12 – 2016-03-18 (×12): 3 mL via INTRAVENOUS

## 2016-03-12 MED ORDER — IOPAMIDOL (ISOVUE-370) INJECTION 76%
INTRAVENOUS | Status: AC
  Administered 2016-03-12: 100 mL
  Filled 2016-03-12: qty 100

## 2016-03-12 MED ORDER — METOPROLOL TARTRATE 12.5 MG HALF TABLET
12.5000 mg | ORAL_TABLET | Freq: Two times a day (BID) | ORAL | Status: DC
  Administered 2016-03-12 – 2016-03-18 (×12): 12.5 mg via ORAL
  Filled 2016-03-12 (×13): qty 1

## 2016-03-12 NOTE — Telephone Encounter (Signed)
Pt states he had a spell last night, where he almost fainted. He called EMS and would like to talk with someone to let them know what they found. States they did an EKG. States his BP was 103/64, HR 100(last night arounf 9) States his HR went down 74 Please call.

## 2016-03-12 NOTE — Telephone Encounter (Signed)
Spoke w/ pt's daughter, Clydie Braun. She reports that pt is currently @ [facility] having a CT. As soon as he is done w/ procedure, she will take pt down to ED.  She is appreciative of the call and for Dr. Serita Kyle concern.

## 2016-03-12 NOTE — Telephone Encounter (Signed)
Per Dr. Okey Dupre, pt should proceed to [facility] ED. No answer, at pt's only contact #. Left message for him to call back ASAP.

## 2016-03-12 NOTE — Progress Notes (Signed)
Received from radiology via w/c and bicarb drip started

## 2016-03-12 NOTE — Telephone Encounter (Addendum)
Attempted again to contact pt.  No answer. Left message for pt's daughter Landry Dyke to call back [phone]).

## 2016-03-12 NOTE — H&P (Signed)
Cardiology Consult    Patient ID: Roy Palmer MRN: [id number], DOB/AGE: 80/08/1929   Admit date: 03/12/2016 Date of Consult: 03/12/2016  Primary Physician: Gabriel Cirriheryl Wicker, NP Primary Cardiologist: Dr. Cristal Deerhristopher End CARDIAC SURGEON: Dr. Cornelius Moraswen Requesting Provider: Dr. Cristal Deerhristopher End  Patient Profile    Roy Palmer is a very pleasant 82 year old gentleman with a past history noted above who is undergoing evaluation for mitral valve repair surgery by Dr. Cornelius Moraswen.   He was initially evaluated by Dr. Okey DupreEnd on September 8 for recurrent episodes of dizziness with exertion. He has not had any true syncopal episodes, but has had some near syncopal episodes. On exam he was noted to have a severe holosystolic murmur at the apex, suggestive of MR. This was confirmed by echocardiogram. He was then referred for TEE and right left heart catheterization as part of preoperative assessment. He was then seen by Dr. Cornelius Moraswen on October 2. He has been completing his preoperative studies with planned surgery on October 24.. Now presents for direct admission for worsening symptoms and possibly expedited surgery. He is having more severe and more frequent near syncopal episodes.  Past Medical History   Past Medical History:  Diagnosis Date  . CKD (chronic kidney disease) stage 3, GFR 30-59 ml/min   . Essential hypertension   . Hyperlipidemia   . Hypertensive kidney disease   . Near syncope    Cardiogenic, related to an SVT and severe MR  . Non-sustained ventricular tachycardia (HCC)   . Severe mitral regurgitation by prior echocardiogram 02/23/2016   Confirmed by TEE  . Trigeminal neuralgia     Past Surgical History:  Procedure Laterality Date  . CARDIAC CATHETERIZATION N/A 02/23/2016   Procedure: Right/Left Heart Cath and Coronary Angiography;  Surgeon: Yvonne Kendallhristopher End, MD;  [facility];  Service: Cardiovascular;  Laterality: N/A;  . CATARACT EXTRACTION, BILATERAL    . COLONOSCOPY  08/2005  .  CYST EXCISION  10/2013   from neck  . ESOPHAGOGASTRODUODENOSCOPY  02/2010  . TEE WITHOUT CARDIOVERSION N/A 02/23/2016   Procedure: TRANSESOPHAGEAL ECHOCARDIOGRAM (TEE);  Surgeon: Quintella Reichertraci R Turner, MD;  [facility];  Service: Cardiovascular;  Laterality: N/A;     Allergies  Allergies  Allergen Reactions  . Pacerone [Amiodarone] Other (See Comments)    Hallucinations  . Lyrica [Pregabalin] Nausea And Vomiting    History of Present Illness    Since seeing Dr. Cornelius Moraswen on October 2, he has been noticing recurrence of his near syncopal episodes where he feels a tightness in his chest and significant dizziness. He usually says that this begins with feeling palpitations and his heart rate going up.  Whenever he feels the heart rate going up into a roughly 100 110 range, he then becomes very symptomatically. His wife describes him becoming pale and somewhat dyspneic. He denies a symptoms of dyspnea, but she sees that he restart it. He describes a tightness in his chest and it dizziness. Last night he had an episode where his heart rate up to about 100, and he felt as though he was going to pass out. This is the worst episode he has had episode lasted about 30 minutes. By the time EMS. His heart rate or gone down his blood pressure was stable and he was feeling better.  He did not tolerate amiodarone that was started by Dr. Okey DupreEnd, stating that he had hallucinations.  Last night's episode was probably the worst episode he has had, but they're now happening more frequently and more  severe symptoms noted. When they contacted Dr. Okey Dupre at clinic today, he recommended having the patient admitted with plans to contact CT surgery about moving up the timeline for surgery.  Cardiovascular ROS: positive for - chest pain, dyspnea on exertion, irregular heartbeat, rapid heart rate and Near syncope negative for - edema, loss of consciousness, orthopnea, paroxysmal nocturnal dyspnea, shortness of breath or  TIA/amaurosis fugax   Outpatient Medications    Prior to Admission medications   Medication Sig Start Date Authorizing Provider  aspirin EC 81 MG tablet Take 1 tablet (81 mg total) by mouth daily. 02/09/16 [name], MD  ibuprofen (ADVIL,MOTRIN) 200 MG tablet Take 200-400 mg by mouth every 6 (six) hours as needed for headache or moderate pain.  Historical Provider, MD  omeprazole (PRILOSEC) 20 MG capsule Take 20 mg by mouth daily.  Historical Provider, MD    Family History    Family History  Problem Relation Age of Onset  . Hypertension Mother   . Stroke Mother   . Heart disease Father   . Heart attack Father   . COPD Neg Hx   . Cancer Neg Hx   . Diabetes Neg Hx     Social History    Social History   Social History  . Marital status: Married    Spouse name: N/A  . Number of children: N/A  . Years of education: N/A   Occupational History  . Not on file.   Social History Main Topics  . Smoking status: Never Smoker  . Smokeless tobacco: Never Used  . Alcohol use No  . Drug use: No  . Sexual activity: Not on file   Other Topics Concern  . Not on file   Social History Narrative  . No narrative on file     Review of Systems    General:  No chills, fever, night sweats or weight changes.  Cardiovascular:  Per history of present illness Dermatological: No rash, lesions/masses Respiratory: No cough, dyspnea Urologic: No hematuria, dysuria Abdominal:   No nausea, vomiting, diarrhea, bright red blood per rectum, melena, or hematemesis Neurologic:  No visual changes, wkns, changes in mental status. All other systems reviewed and are otherwise negative except as noted above.  Physical Exam  Vital signs reviewed -  Blood pressure 150/57 mmHg; rate recorded is 31 bpm, it is probably closer to 60. General: Pleasant, NAD Psych: Normal affect. Neuro: Alert and oriented X 3. Moves all extremities spontaneously. HEENT: Normal  Neck: Supple without bruits or  JVD. Lungs:  Resp regular and unlabored, CTA. Heart: RRR, normal S1 and S2. no s3, s4; blowing 4/6 HSM at apex, but heard throughout.. Abdomen: Soft, non-tender, non-distended, BS + x 4.  Extremities: No clubbing, cyanosis or edema. DP/PT/Radials 2+ and equal bilaterally.  Labs    Troponin (Point of Care Test) No results for input(s): TROPIPOC in the last 72 hours. No results for input(s): CKTOTAL, CKMB, TROPONINI in the last 72 hours. Lab Results  Component Value Date   WBC 8.3 02/29/2016   HGB 16.1 02/29/2016   HCT 45.5 02/29/2016   MCV 92.4 02/29/2016   PLT 154 02/29/2016   No results for input(s): NA, K, CL, CO2, BUN, CREATININE, CALCIUM, PROT, BILITOT, ALKPHOS, ALT, AST, GLUCOSE in the last 168 hours.  Invalid input(s): LABALBU Lab Results  Component Value Date   CHOL 188 02/14/2016   HDL 36 (L) 02/14/2016   LDLCALC 127 (H) 02/14/2016   TRIG 124 02/14/2016   No results found  for: DDIMER   Radiology Studies    Dg Chest 2 View  Result Date: 02/29/2016 CLINICAL DATA:  Dizziness for 2 weeks, hypotension, recent cardiac catheterization EXAM: CHEST  2 VIEW COMPARISON:  Chest x-ray of 02/20/2016 FINDINGS: No active infiltrate or effusion is seen. Mediastinal and hilar contours are unremarkable. Mild cardiomegaly is stable. No acute bony abnormality is seen. IMPRESSION: No active cardiopulmonary disease. Electronically Signed   By: Dwyane Dee M.D.   On: 02/29/2016 11:47   Dg Chest 2 View  Result Date: 02/20/2016 CLINICAL DATA:  Preop heart catheterization. EXAM: CHEST  2 VIEW COMPARISON:  None. FINDINGS: Heart is borderline in size. Lungs are clear. No effusions. No acute bony abnormality. IMPRESSION: No active cardiopulmonary disease. Electronically Signed   By: Charlett Nose M.D.   On: 02/20/2016 12:51   Ct Angio Chest Aorta W &/or Wo Contrast  Result Date: 03/12/2016 CLINICAL DATA:  Thoracic aortic aneurysm. EXAM: CT ANGIOGRAPHY CHEST, ABDOMEN AND PELVIS TECHNIQUE:  Multidetector CT imaging through the chest, abdomen and pelvis was performed using the standard protocol during bolus administration of intravenous contrast. Multiplanar reconstructed images and MIPs were obtained and reviewed to evaluate the vascular anatomy. CONTRAST:  100 mL Isovue 370 COMPARISON:  None. FINDINGS: CTA CHEST FINDINGS Cardiovascular: Preferential opacification of the thoracic aorta. No evidence of thoracic aortic dissection. Distal aortic arch measures 3.5 cm in diameter. Ascending thoracic aorta measures 3.5 cm in AP dimension at the level of the right main pulmonary artery. Descending thoracic aortic aneurysm measuring 3.1 cm in AP diameter at the level of the right main pulmonary artery. Mild thoracic aortic atherosclerosis. Mild coronary artery atherosclerosis in the LAD. Enlarged heart size. No pericardial effusion. Mediastinum/Nodes: No enlarged mediastinal, hilar, or axillary lymph nodes. Thyroid gland, trachea, and esophagus demonstrate no significant findings. Lungs/Pleura: 2.2 x 2.7 cm focal area of airspace disease at the right lung base which may reflect pneumonia versus atelectasis versus less likely neoplasm. No pleural effusion or pneumothorax. Musculoskeletal: No acute osseous abnormality. No lytic or sclerotic osseous lesion. Review of the MIP images confirms the above findings. CTA ABDOMEN AND PELVIS FINDINGS VASCULAR Aorta: Abdominal aortic atherosclerosis. Infrarenal abdominal aortic aneurysm aneurysm measuring 3.5 (AP) x 3.3 (transverse). Aneurysm measures 5.2 cm in length. The neck of the aneurysm is approximately 2.2 cm from the right renal artery origin. Right common iliac artery measures 1.7 cm in diameter. Left common iliac artery measures 1.4 cm in diameter. Mild atherosclerotic plaque in bilateral common iliac arteries. Mild atherosclerotic plaque in bilateral external iliac arteries. Celiac: Patent. SMA: Patent. Renals: Patent right renal artery with minimal  atherosclerotic plaque at the origin. Patent left renal artery with mild atherosclerotic plaque resulting in less than 50% focal stenosis. IMA: Patent with mild atherosclerotic plaque at the origin. Inflow: Patent without evidence of dissection, vasculitis or significant stenosis. Veins: No obvious venous abnormality within the limitations of this arterial phase study. Review of the MIP images confirms the above findings. NON-VASCULAR Hepatobiliary: No focal liver abnormality is seen. No gallstones, gallbladder wall thickening, or biliary dilatation. Pancreas: Unremarkable. No pancreatic ductal dilatation or surrounding inflammatory changes. Spleen: Normal in size without focal abnormality. Adrenals/Urinary Tract: Adrenal glands are unremarkable. Kidneys are normal, without renal calculi, focal lesion, or hydronephrosis. Bladder is unremarkable. Stomach/Bowel: Stomach is within normal limits. Appendix appears normal. No evidence of bowel wall thickening, distention, or inflammatory changes. Diverticulosis without evidence of diverticulitis. Lymphatic: No lymphadenopathy. Reproductive: Prostate is unremarkable. Other: No abdominal wall hernia or abnormality. No  abdominopelvic ascites. Musculoskeletal: No acute osseous abnormality. No aggressive lytic or sclerotic osseous lesion. Osteoarthritis of bilateral sacroiliac joints. Degenerative disc disease with disc height loss at L5-S1 with bilateral facet arthropathy. Review of the MIP images confirms the above findings. IMPRESSION: 1. Distal aortic arch measures 3.5 cm in diameter. Recommend annual imaging followup by CTA or MRA. This recommendation follows 2010 ACCF/AHA/AATS/ACR/ASA/SCA/SCAI/SIR/STS/SVM Guidelines for the Diagnosis and Management of Patients with Thoracic Aortic Disease. Circulation.2010; 121: Q222-L798 2. Infrarenal abdominal aortic aneurysm measuring 3.5 x 3.3 cm. Recommend followup by ultrasound in 2 years. This recommendation follows ACR consensus  guidelines: White Paper of the ACR Incidental Findings Committee II on Vascular Findings. J Am Coll Radiol 2013; 10:789-794. 3. Diverticulosis without evidence of diverticulitis. 4. 2.2 x 2.7 cm focal area of airspace disease at the right lung base which may reflect pneumonia versus atelectasis versus less likely neoplasm. Correlate with laboratory values. Followup CT chest is recommended in 4-6 weeks following trial of antibiotic therapy to ensure resolution and exclude underlying malignancy. 5.  Aortic Atherosclerosis (ICD10-170.0) Electronically Signed   By: Elige Ko   On: 03/12/2016 15:48   Ct Angio Abd/pel W/ And/or W/o  Result Date: 03/12/2016 CLINICAL DATA:  Thoracic aortic aneurysm. EXAM: CT ANGIOGRAPHY CHEST, ABDOMEN AND PELVIS TECHNIQUE: Multidetector CT imaging through the chest, abdomen and pelvis was performed using the standard protocol during bolus administration of intravenous contrast. Multiplanar reconstructed images and MIPs were obtained and reviewed to evaluate the vascular anatomy. CONTRAST:  100 mL Isovue 370 COMPARISON:  None. FINDINGS: CTA CHEST FINDINGS Cardiovascular: Preferential opacification of the thoracic aorta. No evidence of thoracic aortic dissection. Distal aortic arch measures 3.5 cm in diameter. Ascending thoracic aorta measures 3.5 cm in AP dimension at the level of the right main pulmonary artery. Descending thoracic aortic aneurysm measuring 3.1 cm in AP diameter at the level of the right main pulmonary artery. Mild thoracic aortic atherosclerosis. Mild coronary artery atherosclerosis in the LAD. Enlarged heart size. No pericardial effusion. Mediastinum/Nodes: No enlarged mediastinal, hilar, or axillary lymph nodes. Thyroid gland, trachea, and esophagus demonstrate no significant findings. Lungs/Pleura: 2.2 x 2.7 cm focal area of airspace disease at the right lung base which may reflect pneumonia versus atelectasis versus less likely neoplasm. No pleural effusion or  pneumothorax. Musculoskeletal: No acute osseous abnormality. No lytic or sclerotic osseous lesion. Review of the MIP images confirms the above findings. CTA ABDOMEN AND PELVIS FINDINGS VASCULAR Aorta: Abdominal aortic atherosclerosis. Infrarenal abdominal aortic aneurysm aneurysm measuring 3.5 (AP) x 3.3 (transverse). Aneurysm measures 5.2 cm in length. The neck of the aneurysm is approximately 2.2 cm from the right renal artery origin. Right common iliac artery measures 1.7 cm in diameter. Left common iliac artery measures 1.4 cm in diameter. Mild atherosclerotic plaque in bilateral common iliac arteries. Mild atherosclerotic plaque in bilateral external iliac arteries. Celiac: Patent. SMA: Patent. Renals: Patent right renal artery with minimal atherosclerotic plaque at the origin. Patent left renal artery with mild atherosclerotic plaque resulting in less than 50% focal stenosis. IMA: Patent with mild atherosclerotic plaque at the origin. Inflow: Patent without evidence of dissection, vasculitis or significant stenosis. Veins: No obvious venous abnormality within the limitations of this arterial phase study. Review of the MIP images confirms the above findings. NON-VASCULAR Hepatobiliary: No focal liver abnormality is seen. No gallstones, gallbladder wall thickening, or biliary dilatation. Pancreas: Unremarkable. No pancreatic ductal dilatation or surrounding inflammatory changes. Spleen: Normal in size without focal abnormality. Adrenals/Urinary Tract: Adrenal glands are unremarkable. Kidneys  are normal, without renal calculi, focal lesion, or hydronephrosis. Bladder is unremarkable. Stomach/Bowel: Stomach is within normal limits. Appendix appears normal. No evidence of bowel wall thickening, distention, or inflammatory changes. Diverticulosis without evidence of diverticulitis. Lymphatic: No lymphadenopathy. Reproductive: Prostate is unremarkable. Other: No abdominal wall hernia or abnormality. No abdominopelvic  ascites. Musculoskeletal: No acute osseous abnormality. No aggressive lytic or sclerotic osseous lesion. Osteoarthritis of bilateral sacroiliac joints. Degenerative disc disease with disc height loss at L5-S1 with bilateral facet arthropathy. Review of the MIP images confirms the above findings. IMPRESSION: 1. Distal aortic arch measures 3.5 cm in diameter. Recommend annual imaging followup by CTA or MRA. This recommendation follows 2010 ACCF/AHA/AATS/ACR/ASA/SCA/SCAI/SIR/STS/SVM Guidelines for the Diagnosis and Management of Patients with Thoracic Aortic Disease. Circulation.2010; 121: O962-X528 2. Infrarenal abdominal aortic aneurysm measuring 3.5 x 3.3 cm. Recommend followup by ultrasound in 2 years. This recommendation follows ACR consensus guidelines: White Paper of the ACR Incidental Findings Committee II on Vascular Findings. J Am Coll Radiol 2013; 10:789-794. 3. Diverticulosis without evidence of diverticulitis. 4. 2.2 x 2.7 cm focal area of airspace disease at the right lung base which may reflect pneumonia versus atelectasis versus less likely neoplasm. Correlate with laboratory values. Followup CT chest is recommended in 4-6 weeks following trial of antibiotic therapy to ensure resolution and exclude underlying malignancy. 5.  Aortic Atherosclerosis (ICD10-170.0) Electronically Signed   By: Elige Ko   On: 03/12/2016 15:48    ECG & Cardiac Imaging   No EKG.    2-D Echocardiogram 02/14/2016: EF 50-55% with moderate LVH. Possible inferolateral hypokinesis. Moderate-severe MR with posterior leaflet prolapse, cannot rule out flail. Severe LA dilation. Moderate TR.   R&LHC 02/23/16:  1. Mild, non-obstructive coronary artery disease. 2. Normal left and right heart filling pressures. 3. Decreased Fick cardiac output in the setting of frequent PVC's and severe mitral regurgitation by TEE. 4. Normal LVEDP Right Heart Pressures Normal right heart filling pressure (mean RA 5 mmHg). Upper normal  pulmonary artery pressure (mean PA 20 mmHg). Normal left heart filling pressure (mean PCWP 12 mmHg, LVEDP 12 mmHg). Decreased Fick cardiac output (CO 3.6 L/min, CI 1.8 L/min/m^2).  Diagnostic Diagram       TEE 02/23/16: Normal LV size and function.  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 Moderate-TR.     Assessment & Plan    Principal Problem:   Severe mitral regurgitation Active Problems:   Near syncope -- cardiogenic   Non-sustained ventricular tachycardia (HCC)   PVC (premature ventricular contraction)  Patient now having worsening near syncopal symptoms secondary to PVCs and severe MR.  Plan is to admit to inpatient service in order to complete his preoperative evaluation and potentially expedite timeline for mitral valve repair surgery.   Admitted to telemetry service.  -   KVO IV  Will start low-dose beta blocker  Continue aspirin  Subcutaneous heparin for DVT prophylaxis  Will contact CT surgery tomorrow morning, will defer ordering further studies for preoperative evaluation to their service.   Full code   Signed, Bryan Lemma, M.D., M.S. Interventional Cardiologist   Pager # [phone] Phone # [phone] [address] [place], [place] [address]  03/12/2016, 6:42 PM

## 2016-03-12 NOTE — Progress Notes (Signed)
Report called to charge nurse for 2-w and transferred via w/c to 2-w-12

## 2016-03-13 ENCOUNTER — Other Ambulatory Visit: Payer: Self-pay | Admitting: *Deleted

## 2016-03-13 ENCOUNTER — Encounter (HOSPITAL_COMMUNITY): Payer: Self-pay

## 2016-03-13 DIAGNOSIS — I493 Ventricular premature depolarization: Secondary | ICD-10-CM

## 2016-03-13 DIAGNOSIS — R55 Syncope and collapse: Secondary | ICD-10-CM

## 2016-03-13 DIAGNOSIS — I350 Nonrheumatic aortic (valve) stenosis: Secondary | ICD-10-CM

## 2016-03-13 DIAGNOSIS — I472 Ventricular tachycardia: Secondary | ICD-10-CM

## 2016-03-13 DIAGNOSIS — I340 Nonrheumatic mitral (valve) insufficiency: Secondary | ICD-10-CM

## 2016-03-13 LAB — CBC
HCT: 42.9 % (ref 39.0–52.0)
Hemoglobin: 14.4 g/dL (ref 13.0–17.0)
MCH: 31.1 pg (ref 26.0–34.0)
MCHC: 33.6 g/dL (ref 30.0–36.0)
MCV: 92.7 fL (ref 78.0–100.0)
Platelets: 159 10*3/uL (ref 150–400)
RBC: 4.63 MIL/uL (ref 4.22–5.81)
RDW: 14 % (ref 11.5–15.5)
WBC: 8.5 10*3/uL (ref 4.0–10.5)

## 2016-03-13 LAB — LIPID PANEL
Cholesterol: 182 mg/dL (ref 0–200)
HDL: 33 mg/dL — ABNORMAL LOW (ref >40)
LDL Cholesterol: 116 mg/dL — ABNORMAL HIGH (ref 0–99)
Total CHOL/HDL Ratio: 5.5 RATIO
Triglycerides: 163 mg/dL — ABNORMAL HIGH (ref <150)
VLDL: 33 mg/dL (ref 0–40)

## 2016-03-13 LAB — COMPREHENSIVE METABOLIC PANEL
ALT: 22 U/L (ref 17–63)
AST: 17 U/L (ref 15–41)
Albumin: 3.5 g/dL (ref 3.5–5.0)
Alkaline Phosphatase: 44 U/L (ref 38–126)
Anion gap: 7 (ref 5–15)
BUN: 20 mg/dL (ref 6–20)
CO2: 28 mmol/L (ref 22–32)
Calcium: 8.8 mg/dL — ABNORMAL LOW (ref 8.9–10.3)
Chloride: 105 mmol/L (ref 101–111)
Creatinine, Ser: 1.36 mg/dL — ABNORMAL HIGH (ref 0.61–1.24)
GFR calc Af Amer: 53 mL/min — ABNORMAL LOW (ref >60)
GFR calc non Af Amer: 46 mL/min — ABNORMAL LOW (ref >60)
Glucose, Bld: 90 mg/dL (ref 65–99)
Potassium: 4 mmol/L (ref 3.5–5.1)
Sodium: 140 mmol/L (ref 135–145)
Total Bilirubin: 0.8 mg/dL (ref 0.3–1.2)
Total Protein: 5.8 g/dL — ABNORMAL LOW (ref 6.5–8.1)

## 2016-03-13 LAB — PROTIME-INR
INR: 1.06
Prothrombin Time: 13.8 seconds (ref 11.4–15.2)

## 2016-03-13 NOTE — Progress Notes (Signed)
Patient Name: Roy NeatJohn A Palmer Date of Encounter: 03/13/2016  Primary Cardiologist: Dr. Alvester ChouEnd   Hospital Problem List     Active Problems:   Severe mitral regurgitation   Near syncope -- cardiogenic   Non-sustained ventricular tachycardia (HCC)   Symptomatic PVCs   Incidental pulmonary nodule   AAA (abdominal aortic aneurysm) without rupture [facility](HCC)   Thoracic aortic aneurysm (HCC)     Subjective   Feels very weak, tired, dizzy when he gets up. Denies chest pain and SOB.   Inpatient Medications    Scheduled Meds: . aspirin EC  81 mg Oral Daily  . heparin  5,000 Units Subcutaneous Q8H  . metoprolol tartrate  12.5 mg Oral BID  . pantoprazole  40 mg Oral Daily  . sodium chloride flush  3 mL Intravenous Q12H   Continuous Infusions:  PRN Meds:.sodium chloride, acetaminophen, ondansetron (ZOFRAN) IV, sodium chloride flush   Vital Signs    Vitals:   03/12/16 2218 03/13/16 0500 03/13/16 0523 03/13/16 1038  BP: 132/70  (!) 141/93 (!) 141/73  Pulse: 64  61 66  Resp:   18   Temp:   97.8 F (36.6 C)   TempSrc:   Oral   SpO2:   95%   Weight:  85 kg (187 lb 4.8 oz)    Height:       No intake or output data in the 24 hours ending 03/13/16 1112 Filed Weights   03/12/16 1949 03/13/16 0500  Weight: 86.5 kg (190 lb 11.2 oz) 85 kg (187 lb 4.8 oz)    Physical Exam   GEN: Well nourished, well developed, in no acute distress.  HEENT: Grossly normal.  Neck: Supple, no JVD, carotid bruits, or masses. Cardiac: RRR, 4/6 harsh holosystolic murmur, rubs, or gallops. No clubbing, cyanosis, edema.  Radials/DP/PT 2+ and equal bilaterally.  Respiratory:  Respirations regular and unlabored, clear to auscultation bilaterally. GI: Soft, nontender, nondistended, BS + x 4. MS: no deformity or atrophy. Skin: warm and dry, no rash. Neuro:  Strength and sensation are intact. Psych: AAOx3.  Normal affect.  Labs    CBC  Recent Labs  03/12/16 2126 03/13/16 0231  WBC 7.9 8.5  HGB 15.0  14.4  HCT 44.3 42.9  MCV 92.3 92.7  PLT 149* 159   Basic Metabolic Panel  Recent Labs  03/12/16 2126 03/13/16 0231  NA  --  140  K  --  4.0  CL  --  105  CO2  --  28  GLUCOSE  --  90  BUN  --  20  CREATININE 1.35* 1.36*  CALCIUM  --  8.8*   Liver Function Tests  Recent Labs  03/13/16 0231  AST 17  ALT 22  ALKPHOS 44  BILITOT 0.8  PROT 5.8*  ALBUMIN 3.5   Fasting Lipid Panel  Recent Labs  03/13/16 0231  CHOL 182  HDL 33*  LDLCALC 116*  TRIG 163*  CHOLHDL 5.5   Thyroid Function Tests  Recent Labs  03/12/16 2126  TSH 1.554    Telemetry    NSR - Personally Reviewed  ECG     NSR, RBBB, LAFB- Personally Reviewed  Radiology    Ct Angio Chest Aorta W &/or Wo Contrast  Result Date: 03/12/2016 CLINICAL DATA:  Thoracic aortic aneurysm. EXAM: CT ANGIOGRAPHY CHEST, ABDOMEN AND PELVIS TECHNIQUE: Multidetector CT imaging through the chest, abdomen and pelvis was performed using the standard protocol during bolus administration of intravenous contrast. Multiplanar reconstructed images and MIPs were  obtained and reviewed to evaluate the vascular anatomy. CONTRAST:  100 mL Isovue 370 COMPARISON:  None. FINDINGS: CTA CHEST FINDINGS Cardiovascular: Preferential opacification of the thoracic aorta. No evidence of thoracic aortic dissection. Distal aortic arch measures 3.5 cm in diameter. Ascending thoracic aorta measures 3.5 cm in AP dimension at the level of the right main pulmonary artery. Descending thoracic aortic aneurysm measuring 3.1 cm in AP diameter at the level of the right main pulmonary artery. Mild thoracic aortic atherosclerosis. Mild coronary artery atherosclerosis in the LAD. Enlarged heart size. No pericardial effusion. Mediastinum/Nodes: No enlarged mediastinal, hilar, or axillary lymph nodes. Thyroid gland, trachea, and esophagus demonstrate no significant findings. Lungs/Pleura: 2.2 x 2.7 cm focal area of airspace disease at the right lung base which may  reflect pneumonia versus atelectasis versus less likely neoplasm. No pleural effusion or pneumothorax. Musculoskeletal: No acute osseous abnormality. No lytic or sclerotic osseous lesion. Review of the MIP images confirms the above findings. CTA ABDOMEN AND PELVIS FINDINGS VASCULAR Aorta: Abdominal aortic atherosclerosis. Infrarenal abdominal aortic aneurysm aneurysm measuring 3.5 (AP) x 3.3 (transverse). Aneurysm measures 5.2 cm in length. The neck of the aneurysm is approximately 2.2 cm from the right renal artery origin. Right common iliac artery measures 1.7 cm in diameter. Left common iliac artery measures 1.4 cm in diameter. Mild atherosclerotic plaque in bilateral common iliac arteries. Mild atherosclerotic plaque in bilateral external iliac arteries. Celiac: Patent. SMA: Patent. Renals: Patent right renal artery with minimal atherosclerotic plaque at the origin. Patent left renal artery with mild atherosclerotic plaque resulting in less than 50% focal stenosis. IMA: Patent with mild atherosclerotic plaque at the origin. Inflow: Patent without evidence of dissection, vasculitis or significant stenosis. Veins: No obvious venous abnormality within the limitations of this arterial phase study. Review of the MIP images confirms the above findings. NON-VASCULAR Hepatobiliary: No focal liver abnormality is seen. No gallstones, gallbladder wall thickening, or biliary dilatation. Pancreas: Unremarkable. No pancreatic ductal dilatation or surrounding inflammatory changes. Spleen: Normal in size without focal abnormality. Adrenals/Urinary Tract: Adrenal glands are unremarkable. Kidneys are normal, without renal calculi, focal lesion, or hydronephrosis. Bladder is unremarkable. Stomach/Bowel: Stomach is within normal limits. Appendix appears normal. No evidence of bowel wall thickening, distention, or inflammatory changes. Diverticulosis without evidence of diverticulitis. Lymphatic: No lymphadenopathy. Reproductive:  Prostate is unremarkable. Other: No abdominal wall hernia or abnormality. No abdominopelvic ascites. Musculoskeletal: No acute osseous abnormality. No aggressive lytic or sclerotic osseous lesion. Osteoarthritis of bilateral sacroiliac joints. Degenerative disc disease with disc height loss at L5-S1 with bilateral facet arthropathy. Review of the MIP images confirms the above findings. IMPRESSION: 1. Distal aortic arch measures 3.5 cm in diameter. Recommend annual imaging followup by CTA or MRA. This recommendation follows 2010 ACCF/AHA/AATS/ACR/ASA/SCA/SCAI/SIR/STS/SVM Guidelines for the Diagnosis and Management of Patients with Thoracic Aortic Disease. Circulation.2010; 121: Z610-R604 2. Infrarenal abdominal aortic aneurysm measuring 3.5 x 3.3 cm. Recommend followup by ultrasound in 2 years. This recommendation follows ACR consensus guidelines: White Paper of the ACR Incidental Findings Committee II on Vascular Findings. J Am Coll Radiol 2013; 10:789-794. 3. Diverticulosis without evidence of diverticulitis. 4. 2.2 x 2.7 cm focal area of airspace disease at the right lung base which may reflect pneumonia versus atelectasis versus less likely neoplasm. Correlate with laboratory values. Followup CT chest is recommended in 4-6 weeks following trial of antibiotic therapy to ensure resolution and exclude underlying malignancy. 5.  Aortic Atherosclerosis (ICD10-170.0) Electronically Signed   By: Elige Ko   On: 03/12/2016 15:48  Ct Angio Abd/pel W/ And/or W/o  Result Date: 03/12/2016 CLINICAL DATA:  Thoracic aortic aneurysm. EXAM: CT ANGIOGRAPHY CHEST, ABDOMEN AND PELVIS TECHNIQUE: Multidetector CT imaging through the chest, abdomen and pelvis was performed using the standard protocol during bolus administration of intravenous contrast. Multiplanar reconstructed images and MIPs were obtained and reviewed to evaluate the vascular anatomy. CONTRAST:  100 mL Isovue 370 COMPARISON:  None. FINDINGS: CTA CHEST  FINDINGS Cardiovascular: Preferential opacification of the thoracic aorta. No evidence of thoracic aortic dissection. Distal aortic arch measures 3.5 cm in diameter. Ascending thoracic aorta measures 3.5 cm in AP dimension at the level of the right main pulmonary artery. Descending thoracic aortic aneurysm measuring 3.1 cm in AP diameter at the level of the right main pulmonary artery. Mild thoracic aortic atherosclerosis. Mild coronary artery atherosclerosis in the LAD. Enlarged heart size. No pericardial effusion. Mediastinum/Nodes: No enlarged mediastinal, hilar, or axillary lymph nodes. Thyroid gland, trachea, and esophagus demonstrate no significant findings. Lungs/Pleura: 2.2 x 2.7 cm focal area of airspace disease at the right lung base which may reflect pneumonia versus atelectasis versus less likely neoplasm. No pleural effusion or pneumothorax. Musculoskeletal: No acute osseous abnormality. No lytic or sclerotic osseous lesion. Review of the MIP images confirms the above findings. CTA ABDOMEN AND PELVIS FINDINGS VASCULAR Aorta: Abdominal aortic atherosclerosis. Infrarenal abdominal aortic aneurysm aneurysm measuring 3.5 (AP) x 3.3 (transverse). Aneurysm measures 5.2 cm in length. The neck of the aneurysm is approximately 2.2 cm from the right renal artery origin. Right common iliac artery measures 1.7 cm in diameter. Left common iliac artery measures 1.4 cm in diameter. Mild atherosclerotic plaque in bilateral common iliac arteries. Mild atherosclerotic plaque in bilateral external iliac arteries. Celiac: Patent. SMA: Patent. Renals: Patent right renal artery with minimal atherosclerotic plaque at the origin. Patent left renal artery with mild atherosclerotic plaque resulting in less than 50% focal stenosis. IMA: Patent with mild atherosclerotic plaque at the origin. Inflow: Patent without evidence of dissection, vasculitis or significant stenosis. Veins: No obvious venous abnormality within the  limitations of this arterial phase study. Review of the MIP images confirms the above findings. NON-VASCULAR Hepatobiliary: No focal liver abnormality is seen. No gallstones, gallbladder wall thickening, or biliary dilatation. Pancreas: Unremarkable. No pancreatic ductal dilatation or surrounding inflammatory changes. Spleen: Normal in size without focal abnormality. Adrenals/Urinary Tract: Adrenal glands are unremarkable. Kidneys are normal, without renal calculi, focal lesion, or hydronephrosis. Bladder is unremarkable. Stomach/Bowel: Stomach is within normal limits. Appendix appears normal. No evidence of bowel wall thickening, distention, or inflammatory changes. Diverticulosis without evidence of diverticulitis. Lymphatic: No lymphadenopathy. Reproductive: Prostate is unremarkable. Other: No abdominal wall hernia or abnormality. No abdominopelvic ascites. Musculoskeletal: No acute osseous abnormality. No aggressive lytic or sclerotic osseous lesion. Osteoarthritis of bilateral sacroiliac joints. Degenerative disc disease with disc height loss at L5-S1 with bilateral facet arthropathy. Review of the MIP images confirms the above findings. IMPRESSION: 1. Distal aortic arch measures 3.5 cm in diameter. Recommend annual imaging followup by CTA or MRA. This recommendation follows 2010 ACCF/AHA/AATS/ACR/ASA/SCA/SCAI/SIR/STS/SVM Guidelines for the Diagnosis and Management of Patients with Thoracic Aortic Disease. Circulation.2010; 121: W098-J191 2. Infrarenal abdominal aortic aneurysm measuring 3.5 x 3.3 cm. Recommend followup by ultrasound in 2 years. This recommendation follows ACR consensus guidelines: White Paper of the ACR Incidental Findings Committee II on Vascular Findings. J Am Coll Radiol 2013; 10:789-794. 3. Diverticulosis without evidence of diverticulitis. 4. 2.2 x 2.7 cm focal area of airspace disease at the right lung base which may reflect  pneumonia versus atelectasis versus less likely neoplasm.  Correlate with laboratory values. Followup CT chest is recommended in 4-6 weeks following trial of antibiotic therapy to ensure resolution and exclude underlying malignancy. 5.  Aortic Atherosclerosis (ICD10-170.0) Electronically Signed   By: Elige Ko   On: 03/12/2016 15:48     Cardiac Studies  Right/Left Heart Cath and Coronary Angiography 02/23/16 Conclusions: 1. Mild, non-obstructive coronary artery disease. 2. Normal left and right heart filling pressures. 3. Decreased Fick cardiac output in the setting of frequent PVC's and severe mitral regurgitation by TEE.  Recommendations: 1. Initiate amiodarone for frequent PVC's.  Will arrange for 24 hour Holter monitor and PFT's before starting medication. 2. Outpatient cardiac surgery evaluation for mitral valve repair. 3. Start atorvastatin 20 mg daily given mild CAD  Patient Profile     Mr. Bultman is a 82 year old male with a past medical CKD, HTN, HLD, and severe mitral regurgitation. His severe mitral regurgitation was confirmed by TEE. He has planned mitral valve repair surgery for 10/24, however he was admitted yesterday from our office with presyncope. We have asked for a surgical consult to see if its possible to expedite surgery.   Assessment & Plan    1. Severe mitral regurgitation: Awaiting surgery to see. Will follow.   2. History of VT and frequent PVC's: Patient wore event monitor that revealed VT and PVC's. He was intolerant of Amiodarone. He was started on metoprolol 12.5mg  last night and he says it made him jittery and nervous. He does not want to take it. Will discontinue .    Signed, Suzzette Righter, NP 03/13/2016, 11:12 AM  .  I have seen, examined and evaluated the patient this AM along with Suzzette Righter, NP.  After reviewing all the available data and chart, we discussed the patients laboratory, study & physical findings as well as symptoms in detail. I agree with her findings, examination as well as impression  recommendations as per our discussion.    Patient had another episode yesterday of feeling somewhat poor but not as bad. He felt jittery on metoprolol and Refused to take it now. We will just simply changed to when necessary. I'm concerned with frequent PVCs and episodes that were documented as VT but could also potentially be SVT with aberrancy in a patient with flail mitral leaflet and severe MR. Concerned that fixing the valve may not fix the arrhythmia issue. His symptoms right now are probably more related to arrhythmia then the valve, I do agree he needs at the valve done sooner rather than later. I discussed it with Dr. Cornelius Moras from CT surgery and we both feel the patient needs to Electrophysiology evaluation to determine the best course of action to treat his resting bradycardia with bifascicular block, and symptomatic PACs PVCs.  We have consulted EP. We'll make beta blocker when necessary.   Bryan Lemma, M.D., M.S. Interventional Cardiologist   Pager # [phone] Phone # [phone] [id number] [place], [place] [address]

## 2016-03-13 NOTE — Consult Note (Signed)
ELECTROPHYSIOLOGY CONSULT NOTE    Patient ID: Roy Palmer MRN: [id number], DOB/AGE: 80/08/1929 80 y.o.  Admit date: 03/12/2016 Date of Consult: 03/13/2016   Primary Physician: Gabriel Cirriheryl Wicker, NP Primary Cardiologist: Dr. Okey DupreEnd CTS: Dr. Cornelius Moraswen Requesting MD: Dr. Herbie BaltimoreHarding  Reason for Consultation: PVCs, NSVT  HPI: Roy Palmer is a 82 y.o. male with PMHx of severe MR (flail P2) pending surgical intervention, CRI (stage III), HTN?, HLD, known PVCs comes with progressive symptoms of weakness, and near syncope.  He was seen in the ER 02/29/16 with near syncope associated with palpitations and discharged to home after IV hydration and d/w Dr. Okey DupreEnd.  He saw Dr. Cornelius Moraswen 03/04/16 the patient reported to him experiencing episodes of transient dizzy spells associated with shortness of breath and chest discomfort. The patient stated that episodes are always associated with physical exertion. 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  The patient stated that he can feel his heart racing, he develops discomfort across his chest associated with shortness of breath, and he becomes severely dizzy. He stops and lays down and usually within 5-10 minutes symptoms resolve completely. Over the past 6 weeks he has reported numerous episodes which have been increasing in frequency, always brought on with physical exertion.   As part of his out patient w/u with dr. Okey DupreEnd, he had a 24 holter noting frequent PVCs and PACs with several runs of nonsustained ventricular tachycardia  The patient was admitted with steadily increasing symptoms of palpitations associated with weakness and near syncope, given this, timing of his MV surgery being accelerated to early next week.  Concerns regarding his baseline bradycardia, PVCs/NSVT episodes and conduction system disease and inability to tolerate BB have prompted EP evaluation and potential need for PPM implant  pre-operatively.  The patient tells me that about 6 months ago when out on his property he had a near syncopal event, this was this was the first, he had the sensation his heart was fast, this lasted a couple minutes and self resolved.  Since that time he has had recurrent similar events, always with exertion and always lasting a few minutes, he says 2-3 minutes in duration until feeling better, he Roy Palmer either slow or stop what he is doing, at times needing to lay down to feel better.  In the last month or so with increasing frequency and until this admission always exertional.  He reports this time, he had just stood up, tossed a small pillow onto the sofa and felt weak, near fainting and heart fast, longer in duration/slower to resolve and as close as he has ever come to fainting.  He is clear that these episodes are minutes, not seconds.   LABS: K+ 4.0 BUN/Creat 20/1.36 H/H 14/42 WBC 8.5 plts 159  Past Medical History:  Diagnosis Date  . AAA (abdominal aortic aneurysm) without rupture (HCC) 03/12/2016   Small - measured 3.3 x 3.5 cm by CTA  . CKD (chronic kidney disease) stage 3, GFR 30-59 ml/min   . Essential hypertension   . Hyperlipidemia   . Hypertensive kidney disease   . Incidental pulmonary nodule 03/12/2016   Vague opacity right lung base requires radiographic follow up - index of suspicion is LOW  . Near syncope    Cardiogenic, related to an SVT and severe MR  . Non-sustained ventricular tachycardia (HCC)   . Severe mitral regurgitation by prior echocardiogram 02/23/2016   Confirmed by TEE  .  Thoracic aortic aneurysm (HCC) 03/12/2016   Very very mild fusiform enlargement of distal transverse and proximal descending thoracic aorta noted on CTA  . Trigeminal neuralgia      Surgical History:  Past Surgical History:  Procedure Laterality Date  . CARDIAC CATHETERIZATION N/A 02/23/2016   Procedure: Right/Left Heart Cath and Coronary Angiography;  Surgeon: [name], MD;   Location: [facility] INVASIVE CV LAB;  Service: Cardiovascular: Angiographic minimal coronary disease. Normal left and right heart Pressures. Decreased CO/CI in settng of frequent PVCs & Severe MR.  Marland Kitchen CATARACT EXTRACTION, BILATERAL    . COLONOSCOPY  08/2005  . CYST EXCISION  10/2013   from neck  . ESOPHAGOGASTRODUODENOSCOPY  02/2010  . TEE WITHOUT CARDIOVERSION N/A 02/23/2016   Procedure: TRANSESOPHAGEAL ECHOCARDIOGRAM (TEE);  Surgeon: Quintella Reichert, MD;  Location: [facility] ENDOSCOPY;  Service: Cardiovascular: Normal LV size and function.  Degenerative mitral valve disease with failed posterior leaflet (P2 segment), Severe MR with pulmonary vein systolic flow reversal. Moderate-TR.    Marland Kitchen TRANSTHORACIC ECHOCARDIOGRAM  02/14/2016   EF 50-55% with moderate LVH. Possible inferolateral hypokinesis. Moderate-severe MR with posterior leaflet prolapse, cannot rule out flail. Severe LA dilation. Moderate TR.     Prescriptions Prior to Admission  Medication Sig Dispense Refill Last Dose  . aspirin EC 81 MG tablet Take 1 tablet (81 mg total) by mouth daily.   03/12/2016 at Unknown time  . ibuprofen (ADVIL,MOTRIN) 200 MG tablet Take 200-400 mg by mouth every 6 (six) hours as needed for headache or moderate pain.   Past Week at Unknown time  . omeprazole (PRILOSEC) 20 MG capsule Take 20 mg by mouth daily.   03/12/2016 at Unknown time    Inpatient Medications:  . aspirin EC  81 mg Oral Daily  . heparin  5,000 Units Subcutaneous Q8H  . metoprolol tartrate  12.5 mg Oral BID  . pantoprazole  40 mg Oral Daily  . sodium chloride flush  3 mL Intravenous Q12H    Allergies:  Allergies  Allergen Reactions  . Pacerone [Amiodarone] Other (See Comments)    Hallucinations  . Lyrica [Pregabalin] Nausea And Vomiting    Social History   Social History  . Marital status: Married    Spouse name: N/A  . Number of children: N/A  . Years of education: N/A   Occupational History  . Not on file.   Social History Main  Topics  . Smoking status: Never Smoker  . Smokeless tobacco: Never Used  . Alcohol use No  . Drug use: No  . Sexual activity: Not on file   Other Topics Concern  . Not on file   Social History Narrative  . No narrative on file     Family History  Problem Relation Age of Onset  . Hypertension Mother   . Stroke Mother   . Heart disease Father   . Heart attack Father   . COPD Neg Hx   . Cancer Neg Hx   . Diabetes Neg Hx      Review of Systems: All other systems reviewed and are otherwise negative except as noted above.  Physical Exam: Vitals:   03/12/16 2218 03/13/16 0500 03/13/16 0523 03/13/16 1038  BP: 132/70  (!) 141/93 (!) 141/73  Pulse: 64  61 66  Resp:   18   Temp:   97.8 F (36.6 C)   TempSrc:   Oral   SpO2:   95%   Weight:  187 lb 4.8 oz (85 kg)  Height:        GEN- The patient is well appearing, alert and oriented x 3 today.   HEENT: normocephalic, atraumatic; sclera clear, conjunctiva pink; hearing intact; oropharynx clear; neck supple, no JVP Lymph- no cervical lymphadenopathy Lungs- Clear to ausculation bilaterally, normal work of breathing.  No wheezes, rales, rhonchi Heart- Regular rate and rhythm, no extrasystoles are appreciated, 2-3/6 SM, rubs or gallops, PMI not laterally displaced GI- soft, non-tender, non-distended Extremities- no clubbing, cyanosis, or edema MS- no significant deformity, age appropriate atrophy Skin- warm and dry, no rash or lesion Psych- euthymic mood, full affect Neuro- no gross deficits observed  Labs:   Lab Results  Component Value Date   WBC 8.5 03/13/2016   HGB 14.4 03/13/2016   HCT 42.9 03/13/2016   MCV 92.7 03/13/2016   PLT 159 03/13/2016    Recent Labs Lab 03/13/16 0231  NA 140  K 4.0  CL 105  CO2 28  BUN 20  CREATININE 1.36*  CALCIUM 8.8*  PROT 5.8*  BILITOT 0.8  ALKPHOS 44  ALT 22  AST 17  GLUCOSE 90      Radiology/Studies:  Dg Chest 2 View Result Date: 02/29/2016 CLINICAL DATA:   Dizziness for 2 weeks, hypotension, recent cardiac catheterization EXAM: CHEST  2 VIEW COMPARISON:  Chest x-ray of 02/20/2016 FINDINGS: No active infiltrate or effusion is seen. Mediastinal and hilar contours are unremarkable. Mild cardiomegaly is stable. No acute bony abnormality is seen. IMPRESSION: No active cardiopulmonary disease. Electronically Signed   By: Dwyane Dee M.D.   On: 02/29/2016 11:47     EKG: 03/01/16 is SR, RBBB, LAFB TELEMETRY: SB/SR 50's-80's, PVC's, occassionaly/briefly bigemenal, 3 beat WCT episodes infrequently    2-D Echocardiogram 02/14/2016: EF 50-55% with moderate LVH. Possible inferolateral hypokinesis. Moderate-severe MR with posterior leaflet prolapse, cannot rule out flail. Severe LA dilation. Moderate TR.   R&LHC 02/23/16:  1. Mild, non-obstructive coronary artery disease. 2. Normal left and right heart filling pressures. 3. Decreased Fick cardiac output in the setting of frequent PVC's and severe mitral regurgitation by TEE. 4. Normal LVEDP Right Heart Pressures Normal right heart filling pressure (mean RA 5 mmHg). Upper normal pulmonary artery pressure (mean PA 20 mmHg). Normal left heart filling pressure (mean PCWP 12 mmHg, LVEDP 12 mmHg). Decreased Fick cardiac output (CO 3.6 L/min, CI 1.8 L/min/m^2).  Diagnostic Diagram       TEE 02/23/16: Normal LV size and function.  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 Moderate-TR.      Assessment and Plan:   1. Stage D severe symptomatic primary mitral regurgitation and preserved LV systolic function, possible OR Tuesday  2. PVC's, NSVT     Baseline rates appear 55-70's     Pt has has allergy of hallucinations with amiodarone     Pt reported metoprolol last PM of 12.5mg  made him feel jittery and nervous and does not want to take it      3. Bradycardia     Min HR 50 on his holter, EKG with RBBB,  LAFB   Duration of his symptoms he is certain is minutes, brief NSVT episodes noted do not explain this, perhaps bigeminy with slow effective HR with some baseline bradycardia as well.  He has been asymptomatic here, but in bed.  I Shyniece Scripter review with Dr. Elberta Fortis, POC is pending.   SignedFrancis Dowse, PA-C 03/13/2016 12:21 PM   I have seen and examined this  patient with Francis Dowse.  Agree with above, note added to reflect my findings.  On exam, regular rhythm, no murmurs, lungs clear.  Presented to the hospital for MVR. Has episodes of PVCs and NSVT.  PVCs appear to be multifocal.  Has not tolerated amiodarone in the past due to hallucinations.  In looking at his 48 hour monitor, he has 11% PVCs, which is fortunately not enough to cause a decresed LVEF.  He does have dizziness, but there was no bradycardia or heart block on his monitor.  He currently feels well without issues.  His HR did get into the 50s, but this was during sleeping hours.  He does not qualify for pacemaker placement.  It is possible that post MVR, his PVC burden could decrease when the pressure and volume overload are treated.  If he continues to have issues with PVCs, could try tikosyn which would not cause rate slowing.  Flecainide would not be a good choice as he has baseline conduction system disease. Could also use sotalol or verapamil if rate slowing not an issue.   Roy Mceuen M. Neli Fofana MD 03/13/2016 9:26 PM

## 2016-03-13 NOTE — Progress Notes (Addendum)
      [address]       [place] [address]             [phone]     CARDIOTHORACIC SURGERY PROGRESS NOTE  Subjective: Feels fine today but had another episode of near syncope c/w likely recurrent wide complex tachycardia.  Objective: Vital signs in last 24 hours: Temp:  [97.4 F (36.3 C)-97.8 F (36.6 C)] 97.8 F (36.6 C) (10/11 0523) Pulse Rate:  [31-72] 61 (10/11 0523) Cardiac Rhythm: Bundle branch block;Heart block (10/11 0700) Resp:  [18] 18 (10/11 0523) BP: (132-159)/(56-93) 141/93 (10/11 0523) SpO2:  [95 %-100 %] 95 % (10/11 0523) Weight:  [187 lb 4.8 oz (85 kg)-190 lb 11.2 oz (86.5 kg)] 187 lb 4.8 oz (85 kg) (10/11 0500)  Physical Exam:  Rhythm:   Sinus w/ PVC's  Breath sounds: clear  Heart sounds:  RRR w/ holosystolic murmur  Incisions:  n/a  Abdomen:  Soft, non-distended, non-tender  Extremities:  Warm, well-perfused   Intake/Output from previous day: No intake/output data recorded. Intake/Output this shift: No intake/output data recorded.  Lab Results:  Recent Labs  03/12/16 2126 03/13/16 0231  WBC 7.9 8.5  HGB 15.0 14.4  HCT 44.3 42.9  PLT 149* 159   BMET:  Recent Labs  03/12/16 2126 03/13/16 0231  NA  --  140  K  --  4.0  CL  --  105  CO2  --  28  GLUCOSE  --  90  BUN  --  20  CREATININE 1.35* 1.36*  CALCIUM  --  8.8*    CBG (last 3)  No results for input(s): GLUCAP in the last 72 hours. PT/INR:   Recent Labs  03/13/16 0231  LABPROT 13.8  INR 1.06    CXR:  N/A  Assessment/Plan:  Roy Palmer is well known to me from recent outpatient consultation on 03/04/2016.  He has mitral valve prolapse with stage D severe symptomatic primary mitral regurgitation and preserved LV systolic function.  He describes symptoms of exertional chest pain and SOB, but his primary complaint has been that of exercise induced episodes of severe dizzy spells with near syncope that have been found to correlate with recurrent episodes of  non-sustained VT on Holter monitor.  He has baseline sinus bradycardia and significant AV conduction disease with bifascicular block on resting EKG, and he has been intolerant of amiodarone and metoprolol.  He needs mitral valve repair and we could consider proceeding with surgery sooner than originally planned:  At the earliest I can tentatively plan for OR on Tuesday 10/17.  However, mitral valve repair will not help and may adversely affect his AV conduction abnormality.   In addition, mitral valve repair may not decrease or eliminate his problems with recurrent wide complex tachycardia which will remain difficult to treat if he cannot tolerate beta blockers, amiodarone, etc.  He needs formal EPS consultation prior to surgery to develop a plan for management of his arrhythmias.  I spent in excess of 15 minutes during the conduct of this hospital encounter and >50% of this time involved direct face-to-face encounter with the patient for counseling and/or coordination of their care.   Roy Nails, MD 03/13/2016 10:35 AM

## 2016-03-14 ENCOUNTER — Inpatient Hospital Stay (HOSPITAL_COMMUNITY): Payer: Medicare Other

## 2016-03-14 ENCOUNTER — Encounter (HOSPITAL_COMMUNITY): Payer: Self-pay

## 2016-03-14 DIAGNOSIS — Z01810 Encounter for preprocedural cardiovascular examination: Secondary | ICD-10-CM

## 2016-03-14 LAB — URINALYSIS, ROUTINE W REFLEX MICROSCOPIC
Bilirubin Urine: NEGATIVE
Glucose, UA: NEGATIVE mg/dL
Hgb urine dipstick: NEGATIVE
Ketones, ur: NEGATIVE mg/dL
Leukocytes, UA: NEGATIVE
Nitrite: NEGATIVE
Protein, ur: NEGATIVE mg/dL
Specific Gravity, Urine: 1.019 (ref 1.005–1.030)
pH: 5.5 (ref 5.0–8.0)

## 2016-03-14 LAB — VAS US DOPPLER PRE CABG
LEFT ECA DIAS: -6 cm/s
LEFT VERTEBRAL DIAS: -11 cm/s
Left CCA dist dias: -11 cm/s
Left CCA dist sys: -81 cm/s
Left CCA prox dias: 11 cm/s
Left CCA prox sys: 87 cm/s
Left ICA prox dias: -26 cm/s
Left ICA prox sys: -85 cm/s
RIGHT ECA DIAS: -12 cm/s
RIGHT VERTEBRAL DIAS: -8 cm/s
Right CCA prox dias: 15 cm/s
Right CCA prox sys: 118 cm/s
Right cca dist sys: -77 cm/s

## 2016-03-14 LAB — HEMOGLOBIN A1C
Hgb A1c MFr Bld: 5.9 % — ABNORMAL HIGH (ref 4.8–5.6)
Mean Plasma Glucose: 123 mg/dL

## 2016-03-14 NOTE — Progress Notes (Signed)
Pre-op Cardiac Surgery  Carotid Findings:  Bilateral:  1-39% ICA stenosis.  Vertebral artery flow is antegrade.     Upper Extremity Right Left  Brachial Pressures 125 Triphasic 120 Triphasic  Radial Waveforms Biphasic Triphasic  Ulnar Waveforms Triphasic Triphasic  Palmar Arch (Allen's Test) Abnormal Abnormal   Findings:  Palmar arch evaluation - Right - Doppler waveforms diminished greater than 50% with radial compression and remained normal with ulnar compression. Left - Doppler waveforms obliterated with radial compression and remained normal with ulnar compression.   [employer]RVS  03/14/16 01:00 PM

## 2016-03-14 NOTE — Progress Notes (Signed)
[address]       [name] [address]             [phone]     CARDIOTHORACIC SURGERY PROGRESS NOTE  Subjective: No complaints.  No SOB.  No palpitations.  No dizzy spells  Objective: Vital signs in last 24 hours: Temp:  [97.8 F (36.6 C)-98.7 F (37.1 C)] 97.8 F (36.6 C) (10/12 1338) Pulse Rate:  [56-62] 56 (10/12 1338) Cardiac Rhythm: Bundle branch block;Heart block (10/12 0700) Resp:  [18-19] 18 (10/12 1338) BP: (132-133)/(64-79) 133/64 (10/12 1338) SpO2:  [96 %] 96 % (10/12 0454) Weight:  [186 lb (84.4 kg)] 186 lb (84.4 kg) (10/12 0454)  Physical Exam:  Rhythm:   sinus  Breath sounds: clear  Heart sounds:  RRR w/ holosystolic murmur  Incisions:  n/a  Abdomen:  soft  Extremities:  warm   Intake/Output from previous day: 10/11 0701 - 10/12 0700 In: 360 [P.O.:360] Out: -  Intake/Output this shift: No intake/output data recorded.  Lab Results:  Recent Labs  03/12/16 2126 03/13/16 0231  WBC 7.9 8.5  HGB 15.0 14.4  HCT 44.3 42.9  PLT 149* 159   BMET:  Recent Labs  03/12/16 2126 03/13/16 0231  NA  --  140  K  --  4.0  CL  --  105  CO2  --  28  GLUCOSE  --  90  BUN  --  20  CREATININE 1.35* 1.36*  CALCIUM  --  8.8*    CBG (last 3)  No results for input(s): GLUCAP in the last 72 hours. PT/INR:   Recent Labs  03/13/16 0231  LABPROT 13.8  INR 1.06    CXR:  N/A  Assessment/Plan:  Patient remains clinically stable.  Input from EPS team appreciated.  Patient now tolerating low dose metoprolol so far.  Tentatively for d/c home tomorrow and return for surgery next Tuesday.  The patient and his family were again counseled at length regarding the indications, risks and potential benefits of mitral valve repair.  The rationale for elective surgery has been explained, including a comparison between surgery and continued medical therapy with close follow-up.  The likelihood of successful and durable valve repair has been  discussed with particular reference to the findings of their recent echocardiogram.  Based upon these findings and previous experience, I have quoted them a greater than 95 percent likelihood of successful valve repair.  In the unlikely event that their valve cannot be successfully repaired, we discussed the possibility of replacing the mitral valve using a mechanical prosthesis with the attendant need for long-term anticoagulation versus the alternative of replacing it using a bioprosthetic tissue valve with its potential for late structural valve deterioration and failure, depending upon the patient's longevity.  The patient specifically requests that if the mitral valve must be replaced that it be done using a bioprosthetic tissue valve.   The patient understands and accepts all potential risks of surgery including but not limited to risk of death, stroke or other neurologic complication, myocardial infarction, congestive heart failure, respiratory failure, renal failure, bleeding requiring transfusion and/or reexploration, arrhythmia, infection or other wound complications, pneumonia, pleural and/or pericardial effusion, pulmonary embolus, aortic dissection or other major vascular complication, or delayed complications related to valve repair or replacement including but not limited to structural valve deterioration and failure, thrombosis, embolization, endocarditis, or paravalvular leak.  Alternative surgical approaches have been discussed including a comparison between conventional sternotomy and minimally-invasive techniques.  The relative risks and benefits of each have been reviewed as they pertain to the patient's specific circumstances, and all of their questions have been addressed.  Specific risks potentially related to the minimally-invasive approach were discussed at length, including but not limited to risk of conversion to full or partial sternotomy, aortic dissection or other major vascular  complication, unilateral acute lung injury or pulmonary edema, phrenic nerve dysfunction or paralysis, rib fracture, chronic pain, lung hernia, or lymphocele.  All of their questions have been answered.  I spent in excess of 15 minutes during the conduct of this hospital encounter and >50% of this time involved direct face-to-face encounter with the patient for counseling and/or coordination of their care.   Purcell Nailslarence H Owen, MD 03/14/2016 2:10 PM

## 2016-03-14 NOTE — Progress Notes (Signed)
CARDIAC REHAB PHASE I   PRE:  Rate/Rhythm: 63 SR  BP:  Supine:   Sitting: 142/70  Standing:    SaO2: 95%RA  MODE:  Ambulation: 360 ft   POST:  Rate/Rhythm: 61 SR  BP:  Supine:   Sitting: 129/69  Standing:    SaO2: 94%RA 1445-1517 Pt walked 360 ft on RA with steady gait. No dizziness and tolerated well. Gave pt and wife OHS booklet,care guide and put on preop video to view. Discussed importance of mobility and IS after surgery. Will follow up after surgery.   Luetta Nutting, RN BSN  03/14/2016 3:13 PM

## 2016-03-14 NOTE — Progress Notes (Signed)
Patient Name: Roy Palmer Date of Encounter: 03/14/2016  Primary Cardiologist: Dr. Alvester Chou Problem List     Active Problems:   Severe mitral regurgitation   Near syncope -- cardiogenic   Non-sustained ventricular tachycardia (HCC)   Symptomatic PVCs   Incidental pulmonary nodule   AAA (abdominal aortic aneurysm) without rupture [facility])   Thoracic aortic aneurysm (HCC)     Subjective   Feels very weak, tired, dizzy when he gets up. Denies chest pain and SOB.   Inpatient Medications    Scheduled Meds: . aspirin EC  81 mg Oral Daily  . heparin  5,000 Units Subcutaneous Q8H  . metoprolol tartrate  12.5 mg Oral BID  . pantoprazole  40 mg Oral Daily  . sodium chloride flush  3 mL Intravenous Q12H   Continuous Infusions:  PRN Meds:.sodium chloride, acetaminophen, ondansetron (ZOFRAN) IV, sodium chloride flush   Vital Signs    Vitals:   03/13/16 1038 03/13/16 1357 03/13/16 2135 03/14/16 0454  BP: (!) 141/73 (!) 118/56 133/79 132/75  Pulse: 66 62 62 62  Resp:  18  19  Temp:  98 F (36.7 C)  98.7 F (37.1 C)  TempSrc:  Oral  Oral  SpO2:    96%  Weight:    84.4 kg (186 lb)  Height:        Intake/Output Summary (Last 24 hours) at 03/14/16 1237 Last data filed at 03/13/16 1800  Gross per 24 hour  Intake              360 ml  Output                0 ml  Net              360 ml   Filed Weights   03/12/16 1949 03/13/16 0500 03/14/16 0454  Weight: 86.5 kg (190 lb 11.2 oz) 85 kg (187 lb 4.8 oz) 84.4 kg (186 lb)    Physical Exam   GEN: Well nourished, well developed, in no acute distress.  HEENT: Grossly normal.  Neck: Supple, no JVD, carotid bruits, or masses. Cardiac: RRR, 4/6 harsh holosystolic murmur, rubs, or gallops. No clubbing, cyanosis, edema.  Radials/DP/PT 2+ and equal bilaterally.  Respiratory:  Respirations regular and unlabored, clear to auscultation bilaterally. GI: Soft, nontender, nondistended, BS + x 4. MS: no deformity or  atrophy. Skin: warm and dry, no rash. Neuro:  Strength and sensation are intact. Psych: AAOx3.  Normal affect.  Labs    CBC  Recent Labs  03/12/16 2126 03/13/16 0231  WBC 7.9 8.5  HGB 15.0 14.4  HCT 44.3 42.9  MCV 92.3 92.7  PLT 149* 159   Basic Metabolic Panel  Recent Labs  03/12/16 2126 03/13/16 0231  NA  --  140  K  --  4.0  CL  --  105  CO2  --  28  GLUCOSE  --  90  BUN  --  20  CREATININE 1.35* 1.36*  CALCIUM  --  8.8*   Liver Function Tests  Recent Labs  03/13/16 0231  AST 17  ALT 22  ALKPHOS 44  BILITOT 0.8  PROT 5.8*  ALBUMIN 3.5   Fasting Lipid Panel  Recent Labs  03/13/16 0231  CHOL 182  HDL 33*  LDLCALC 116*  TRIG 163*  CHOLHDL 5.5   Thyroid Function Tests  Recent Labs  03/12/16 2126  TSH 1.554    Telemetry    NSR - Personally  Reviewed  ECG     NSR, RBBB, LAFB- Personally Reviewed  Radiology    Ct Angio Chest Aorta W &/or Wo Contrast  Result Date: 03/12/2016 CLINICAL DATA:  Thoracic aortic aneurysm. EXAM: CT ANGIOGRAPHY CHEST, ABDOMEN AND PELVIS TECHNIQUE: Multidetector CT imaging through the chest, abdomen and pelvis was performed using the standard protocol during bolus administration of intravenous contrast. Multiplanar reconstructed images and MIPs were obtained and reviewed to evaluate the vascular anatomy. CONTRAST:  100 mL Isovue 370 COMPARISON:  None. FINDINGS: CTA CHEST FINDINGS Cardiovascular: Preferential opacification of the thoracic aorta. No evidence of thoracic aortic dissection. Distal aortic arch measures 3.5 cm in diameter. Ascending thoracic aorta measures 3.5 cm in AP dimension at the level of the right main pulmonary artery. Descending thoracic aortic aneurysm measuring 3.1 cm in AP diameter at the level of the right main pulmonary artery. Mild thoracic aortic atherosclerosis. Mild coronary artery atherosclerosis in the LAD. Enlarged heart size. No pericardial effusion. Mediastinum/Nodes: No enlarged  mediastinal, hilar, or axillary lymph nodes. Thyroid gland, trachea, and esophagus demonstrate no significant findings. Lungs/Pleura: 2.2 x 2.7 cm focal area of airspace disease at the right lung base which may reflect pneumonia versus atelectasis versus less likely neoplasm. No pleural effusion or pneumothorax. Musculoskeletal: No acute osseous abnormality. No lytic or sclerotic osseous lesion. Review of the MIP images confirms the above findings. CTA ABDOMEN AND PELVIS FINDINGS VASCULAR Aorta: Abdominal aortic atherosclerosis. Infrarenal abdominal aortic aneurysm aneurysm measuring 3.5 (AP) x 3.3 (transverse). Aneurysm measures 5.2 cm in length. The neck of the aneurysm is approximately 2.2 cm from the right renal artery origin. Right common iliac artery measures 1.7 cm in diameter. Left common iliac artery measures 1.4 cm in diameter. Mild atherosclerotic plaque in bilateral common iliac arteries. Mild atherosclerotic plaque in bilateral external iliac arteries. Celiac: Patent. SMA: Patent. Renals: Patent right renal artery with minimal atherosclerotic plaque at the origin. Patent left renal artery with mild atherosclerotic plaque resulting in less than 50% focal stenosis. IMA: Patent with mild atherosclerotic plaque at the origin. Inflow: Patent without evidence of dissection, vasculitis or significant stenosis. Veins: No obvious venous abnormality within the limitations of this arterial phase study. Review of the MIP images confirms the above findings. NON-VASCULAR Hepatobiliary: No focal liver abnormality is seen. No gallstones, gallbladder wall thickening, or biliary dilatation. Pancreas: Unremarkable. No pancreatic ductal dilatation or surrounding inflammatory changes. Spleen: Normal in size without focal abnormality. Adrenals/Urinary Tract: Adrenal glands are unremarkable. Kidneys are normal, without renal calculi, focal lesion, or hydronephrosis. Bladder is unremarkable. Stomach/Bowel: Stomach is within  normal limits. Appendix appears normal. No evidence of bowel wall thickening, distention, or inflammatory changes. Diverticulosis without evidence of diverticulitis. Lymphatic: No lymphadenopathy. Reproductive: Prostate is unremarkable. Other: No abdominal wall hernia or abnormality. No abdominopelvic ascites. Musculoskeletal: No acute osseous abnormality. No aggressive lytic or sclerotic osseous lesion. Osteoarthritis of bilateral sacroiliac joints. Degenerative disc disease with disc height loss at L5-S1 with bilateral facet arthropathy. Review of the MIP images confirms the above findings. IMPRESSION: 1. Distal aortic arch measures 3.5 cm in diameter. Recommend annual imaging followup by CTA or MRA. This recommendation follows 2010 ACCF/AHA/AATS/ACR/ASA/SCA/SCAI/SIR/STS/SVM Guidelines for the Diagnosis and Management of Patients with Thoracic Aortic Disease. Circulation.2010; 121: Z610-R604 2. Infrarenal abdominal aortic aneurysm measuring 3.5 x 3.3 cm. Recommend followup by ultrasound in 2 years. This recommendation follows ACR consensus guidelines: White Paper of the ACR Incidental Findings Committee II on Vascular Findings. J Am Coll Radiol 2013; 10:789-794. 3. Diverticulosis without evidence of  diverticulitis. 4. 2.2 x 2.7 cm focal area of airspace disease at the right lung base which may reflect pneumonia versus atelectasis versus less likely neoplasm. Correlate with laboratory values. Followup CT chest is recommended in 4-6 weeks following trial of antibiotic therapy to ensure resolution and exclude underlying malignancy. 5.  Aortic Atherosclerosis (ICD10-170.0) Electronically Signed   By: Elige KoHetal  Patel   On: 03/12/2016 15:48   Ct Angio Abd/pel W/ And/or W/o  Result Date: 03/12/2016 CLINICAL DATA:  Thoracic aortic aneurysm. EXAM: CT ANGIOGRAPHY CHEST, ABDOMEN AND PELVIS TECHNIQUE: Multidetector CT imaging through the chest, abdomen and pelvis was performed using the standard protocol during bolus  administration of intravenous contrast. Multiplanar reconstructed images and MIPs were obtained and reviewed to evaluate the vascular anatomy. CONTRAST:  100 mL Isovue 370 COMPARISON:  None. FINDINGS: CTA CHEST FINDINGS Cardiovascular: Preferential opacification of the thoracic aorta. No evidence of thoracic aortic dissection. Distal aortic arch measures 3.5 cm in diameter. Ascending thoracic aorta measures 3.5 cm in AP dimension at the level of the right main pulmonary artery. Descending thoracic aortic aneurysm measuring 3.1 cm in AP diameter at the level of the right main pulmonary artery. Mild thoracic aortic atherosclerosis. Mild coronary artery atherosclerosis in the LAD. Enlarged heart size. No pericardial effusion. Mediastinum/Nodes: No enlarged mediastinal, hilar, or axillary lymph nodes. Thyroid gland, trachea, and esophagus demonstrate no significant findings. Lungs/Pleura: 2.2 x 2.7 cm focal area of airspace disease at the right lung base which may reflect pneumonia versus atelectasis versus less likely neoplasm. No pleural effusion or pneumothorax. Musculoskeletal: No acute osseous abnormality. No lytic or sclerotic osseous lesion. Review of the MIP images confirms the above findings. CTA ABDOMEN AND PELVIS FINDINGS VASCULAR Aorta: Abdominal aortic atherosclerosis. Infrarenal abdominal aortic aneurysm aneurysm measuring 3.5 (AP) x 3.3 (transverse). Aneurysm measures 5.2 cm in length. The neck of the aneurysm is approximately 2.2 cm from the right renal artery origin. Right common iliac artery measures 1.7 cm in diameter. Left common iliac artery measures 1.4 cm in diameter. Mild atherosclerotic plaque in bilateral common iliac arteries. Mild atherosclerotic plaque in bilateral external iliac arteries. Celiac: Patent. SMA: Patent. Renals: Patent right renal artery with minimal atherosclerotic plaque at the origin. Patent left renal artery with mild atherosclerotic plaque resulting in less than 50% focal  stenosis. IMA: Patent with mild atherosclerotic plaque at the origin. Inflow: Patent without evidence of dissection, vasculitis or significant stenosis. Veins: No obvious venous abnormality within the limitations of this arterial phase study. Review of the MIP images confirms the above findings. NON-VASCULAR Hepatobiliary: No focal liver abnormality is seen. No gallstones, gallbladder wall thickening, or biliary dilatation. Pancreas: Unremarkable. No pancreatic ductal dilatation or surrounding inflammatory changes. Spleen: Normal in size without focal abnormality. Adrenals/Urinary Tract: Adrenal glands are unremarkable. Kidneys are normal, without renal calculi, focal lesion, or hydronephrosis. Bladder is unremarkable. Stomach/Bowel: Stomach is within normal limits. Appendix appears normal. No evidence of bowel wall thickening, distention, or inflammatory changes. Diverticulosis without evidence of diverticulitis. Lymphatic: No lymphadenopathy. Reproductive: Prostate is unremarkable. Other: No abdominal wall hernia or abnormality. No abdominopelvic ascites. Musculoskeletal: No acute osseous abnormality. No aggressive lytic or sclerotic osseous lesion. Osteoarthritis of bilateral sacroiliac joints. Degenerative disc disease with disc height loss at L5-S1 with bilateral facet arthropathy. Review of the MIP images confirms the above findings. IMPRESSION: 1. Distal aortic arch measures 3.5 cm in diameter. Recommend annual imaging followup by CTA or MRA. This recommendation follows 2010 ACCF/AHA/AATS/ACR/ASA/SCA/SCAI/SIR/STS/SVM Guidelines for the Diagnosis and Management of Patients with Thoracic  Aortic Disease. Circulation.2010; 121: Z610-R604 2. Infrarenal abdominal aortic aneurysm measuring 3.5 x 3.3 cm. Recommend followup by ultrasound in 2 years. This recommendation follows ACR consensus guidelines: White Paper of the ACR Incidental Findings Committee II on Vascular Findings. J Am Coll Radiol 2013; 10:789-794. 3.  Diverticulosis without evidence of diverticulitis. 4. 2.2 x 2.7 cm focal area of airspace disease at the right lung base which may reflect pneumonia versus atelectasis versus less likely neoplasm. Correlate with laboratory values. Followup CT chest is recommended in 4-6 weeks following trial of antibiotic therapy to ensure resolution and exclude underlying malignancy. 5.  Aortic Atherosclerosis (ICD10-170.0) Electronically Signed   By: Elige Ko   On: 03/12/2016 15:48     Cardiac Studies  Right/Left Heart Cath and Coronary Angiography 02/23/16 Conclusions: 1. Mild, non-obstructive coronary artery disease. 2. Normal left and right heart filling pressures. 3. Decreased Fick cardiac output in the setting of frequent PVC's and severe mitral regurgitation by TEE.  Recommendations: 1. Initiate amiodarone for frequent PVC's.  Will arrange for 24 hour Holter monitor and PFT's before starting medication. 2. Outpatient cardiac surgery evaluation for mitral valve repair. 3. Start atorvastatin 20 mg daily given mild CAD  Patient Profile     Roy Palmer is a 82 year old male with a past medical CKD, HTN, HLD, and severe mitral regurgitation. His severe mitral regurgitation was confirmed by TEE. He has planned mitral valve repair surgery for 10/24, however he was admitted yesterday from our office with presyncope. We have asked for a surgical consult to see if its possible to expedite surgery.   Assessment & Plan    1. Severe mitral regurgitation: Awaiting surgery to see. Will follow.  - Surgery appears to have been moved up till next Tuesday.  2. History of VT and frequent PVC's: Patient wore event monitor that revealed VT and PVC's. He was intolerant of Amiodarone. He was started on metoprolol 12.5mg  last night and he says it made him jittery and nervous. He does not want to take it. Will discontinue . --Was able to tolerate low-dose beta blocker today. I would like for him to stay on the beta  blocker and see how he does for 24 hours. He still is having PVCs with short 4-5 beat runs of NSVT. Seen by electrophysiology did, they did not feel that this was enough to cause his symptoms, nor did they feel like he may criteria for pacemaker. I think perioperatively will be good idea to have them on board to see how his symptoms are following surgery.   Provided he does well with his oral beta blocker today and tomorrow morning with less PVCs and is able tolerate symptomatically, I think we could consider discharge tomorrow to let him be home over the weekend prior to surgery.   Signed,    Bryan Lemma, M.D., M.S. Interventional Cardiologist   Pager # [phone] Phone # [phone] [address] [place], [place] [address]

## 2016-03-15 ENCOUNTER — Inpatient Hospital Stay (HOSPITAL_COMMUNITY): Payer: Medicare Other

## 2016-03-15 ENCOUNTER — Encounter (HOSPITAL_COMMUNITY): Payer: Self-pay

## 2016-03-15 DIAGNOSIS — R001 Bradycardia, unspecified: Secondary | ICD-10-CM

## 2016-03-15 DIAGNOSIS — R55 Syncope and collapse: Secondary | ICD-10-CM

## 2016-03-15 DIAGNOSIS — I350 Nonrheumatic aortic (valve) stenosis: Secondary | ICD-10-CM

## 2016-03-15 HISTORY — DX: Syncope and collapse: R55

## 2016-03-15 LAB — BLOOD GAS, ARTERIAL
Acid-Base Excess: 1.7 mmol/L (ref 0.0–2.0)
Bicarbonate: 25.8 mmol/L (ref 20.0–28.0)
Drawn by: 36496
FIO2: 21
O2 Saturation: 93.1 %
Patient temperature: 98.6
pCO2 arterial: 40.8 mmHg (ref 32.0–48.0)
pH, Arterial: 7.417 (ref 7.350–7.450)
pO2, Arterial: 68.9 mmHg — ABNORMAL LOW (ref 83.0–108.0)

## 2016-03-15 LAB — PULMONARY FUNCTION TEST
DL/VA % pred: 79 %
DL/VA: 3.67 ml/min/mmHg/L
DLCO unc % pred: 47 %
DLCO unc: 16.15 ml/min/mmHg
FEF 25-75 Pre: 1.76 L/sec
FEF2575-%Pred-Pre: 94 %
FEV1-%Pred-Pre: 81 %
FEV1-Pre: 2.32 L
FEV1FVC-%Pred-Pre: 106 %
FEV6-%Pred-Pre: 81 %
FEV6-Pre: 3.03 L
FEV6FVC-%Pred-Pre: 106 %
FVC-%Pred-Pre: 76 %
FVC-Pre: 3.06 L
Pre FEV1/FVC ratio: 76 %
Pre FEV6/FVC Ratio: 99 %
RV % pred: 82 %
RV: 2.32 L
TLC % pred: 73 %
TLC: 5.32 L

## 2016-03-15 LAB — COMPREHENSIVE METABOLIC PANEL
ALT: 23 U/L (ref 17–63)
AST: 20 U/L (ref 15–41)
Albumin: 3.6 g/dL (ref 3.5–5.0)
Alkaline Phosphatase: 43 U/L (ref 38–126)
Anion gap: 7 (ref 5–15)
BUN: 15 mg/dL (ref 6–20)
CO2: 27 mmol/L (ref 22–32)
Calcium: 8.9 mg/dL (ref 8.9–10.3)
Chloride: 106 mmol/L (ref 101–111)
Creatinine, Ser: 1.46 mg/dL — ABNORMAL HIGH (ref 0.61–1.24)
GFR calc Af Amer: 49 mL/min — ABNORMAL LOW (ref >60)
GFR calc non Af Amer: 42 mL/min — ABNORMAL LOW (ref >60)
Glucose, Bld: 98 mg/dL (ref 65–99)
Potassium: 4.5 mmol/L (ref 3.5–5.1)
Sodium: 140 mmol/L (ref 135–145)
Total Bilirubin: 0.7 mg/dL (ref 0.3–1.2)
Total Protein: 6.2 g/dL — ABNORMAL LOW (ref 6.5–8.1)

## 2016-03-15 LAB — ABO/RH: ABO/RH(D): A NEG

## 2016-03-15 LAB — PROTIME-INR
INR: 0.99
Prothrombin Time: 13.1 seconds (ref 11.4–15.2)

## 2016-03-15 LAB — CBC
HCT: 43.3 % (ref 39.0–52.0)
Hemoglobin: 14.5 g/dL (ref 13.0–17.0)
MCH: 31.2 pg (ref 26.0–34.0)
MCHC: 33.5 g/dL (ref 30.0–36.0)
MCV: 93.1 fL (ref 78.0–100.0)
Platelets: 148 10*3/uL — ABNORMAL LOW (ref 150–400)
RBC: 4.65 MIL/uL (ref 4.22–5.81)
RDW: 13.8 % (ref 11.5–15.5)
WBC: 8.5 10*3/uL (ref 4.0–10.5)

## 2016-03-15 LAB — PREALBUMIN: Prealbumin: 24.4 mg/dL (ref 18–38)

## 2016-03-15 LAB — APTT: aPTT: 47 seconds — ABNORMAL HIGH (ref 24–36)

## 2016-03-15 LAB — TYPE AND SCREEN
ABO/RH(D): A NEG
Antibody Screen: NEGATIVE

## 2016-03-15 MED ORDER — ALBUTEROL SULFATE (2.5 MG/3ML) 0.083% IN NEBU
2.5000 mg | INHALATION_SOLUTION | Freq: Once | RESPIRATORY_TRACT | Status: AC
  Administered 2016-03-15: 2.5 mg via RESPIRATORY_TRACT

## 2016-03-15 NOTE — Progress Notes (Addendum)
Patient Name: Roy Palmer Date of Encounter: 03/15/2016  Primary Cardiologist:   Hospital Problem List     Active Problems:   Severe mitral regurgitation   Near syncope -- cardiogenic   Symptomatic PVCs   Non-sustained ventricular tachycardia (HCC)   Incidental pulmonary nodule   AAA (abdominal aortic aneurysm) without rupture (HCC)   Thoracic aortic aneurysm (HCC)    Subjective   Feels ok, weak. Just returned from respiratory department after vagal episode.   Inpatient Medications    Scheduled Meds: . aspirin EC  81 mg Oral Daily  . heparin  5,000 Units Subcutaneous Q8H  . metoprolol tartrate  12.5 mg Oral BID  . pantoprazole  40 mg Oral Daily  . sodium chloride flush  3 mL Intravenous Q12H   Continuous Infusions:   PRN Meds: sodium chloride, acetaminophen, ondansetron (ZOFRAN) IV, sodium chloride flush   Vital Signs    Vitals:   03/14/16 0454 03/14/16 1338 03/14/16 2017 03/15/16 0538  BP: 132/75 133/64 128/61 127/69  Pulse: 62 (!) 56 61 (!) 57  Resp: 19 18 18 16   Temp: 98.7 F (37.1 C) 97.8 F (36.6 C) 97.6 F (36.4 C) 97.7 F (36.5 C)  TempSrc: Oral Oral Oral Oral  SpO2: 96%  95% 95%  Weight: 186 lb (84.4 kg)   184 lb 11.2 oz (83.8 kg)  Height:        Intake/Output Summary (Last 24 hours) at 03/15/16 1012 Last data filed at 03/14/16 1700  Gross per 24 hour  Intake              240 ml  Output                0 ml  Net              240 ml   Filed Weights   03/13/16 0500 03/14/16 0454 03/15/16 0538  Weight: 187 lb 4.8 oz (85 kg) 186 lb (84.4 kg) 184 lb 11.2 oz (83.8 kg)    Physical Exam    GEN: Well nourished, well developed, in no acute distress.  HEENT: Grossly normal.  Neck: Supple, no JVD, carotid bruits, or masses. Cardiac: RRR, 3/6 systolic murmur, rubs, or gallops. No clubbing, cyanosis, edema.  Radials/DP/PT 2+ and equal bilaterally.  Respiratory:  Respirations regular and unlabored, clear to auscultation bilaterally. GI: Soft,  nontender, nondistended, BS + x 4. MS: no deformity or atrophy. Skin: warm and dry, no rash. Neuro:  Strength and sensation are intact. Psych: AAOx3.  Normal affect.  Labs    CBC  Recent Labs  03/13/16 0231 03/15/16 0430  WBC 8.5 8.5  HGB 14.4 14.5  HCT 42.9 43.3  MCV 92.7 93.1  PLT 159 148*   Basic Metabolic Panel  Recent Labs  03/13/16 0231 03/15/16 0430  NA 140 140  K 4.0 4.5  CL 105 106  CO2 28 27  GLUCOSE 90 98  BUN 20 15  CREATININE 1.36* 1.46*  CALCIUM 8.8* 8.9   Liver Function Tests  Recent Labs  03/13/16 0231 03/15/16 0430  AST 17 20  ALT 22 23  ALKPHOS 44 43  BILITOT 0.8 0.7  PROT 5.8* 6.2*  ALBUMIN 3.5 3.6     Recent Labs  03/12/16 2126  HGBA1C 5.9*   Fasting Lipid Panel  Recent Labs  03/13/16 0231  CHOL 182  HDL 33*  LDLCALC 116*  TRIG 163*  CHOLHDL 5.5   Thyroid Function Tests  Recent Labs  03/12/16 2126  TSH 1.554    Telemetry     NSR- Personally Reviewed  ECG    NSR, RBBB, LAFB- Personally Reviewed  Radiology    Dg Chest 2 View  Result Date: 03/15/2016 CLINICAL DATA:  CHF. Preoperative examination for mitral valve replacement. History of thoracic aortic aneurysm. EXAM: CHEST  2 VIEW COMPARISON:  Chest x-ray of February 29 2016 FINDINGS: The lungs are adequately inflated and clear. The heart and pulmonary vascularity are normal. There is faint calcification in the wall of the aortic arch. There is mild tortuosity of the descending thoracic aorta. There is no pleural effusion. The bony thorax exhibits no acute abnormality. IMPRESSION: There is no active cardiopulmonary disease. Aortic atherosclerosis. Electronically Signed   By: David  [place] M.D.   On: 03/15/2016 07:42    Cardiac Studies  Right/Left Heart Cath and Coronary Angiography 02/23/16 Conclusions: 1. Mild, non-obstructive coronary artery disease. 2. Normal left and right heart filling pressures. 3. Decreased Fick cardiac output in the setting of  frequent PVC's and severe mitral regurgitation by TEE.  Recommendations: 1. Initiate amiodarone for frequent PVC's. Will arrange for 24 hour Holter monitor and PFT's before starting medication. 2. Outpatient cardiac surgery evaluation for mitral valve repair. 3. Start atorvastatin 20 mg daily given mild CAD    Patient Profile  Roy Palmer is a 82 year old male with a past medical CKD, HTN, HLD, and severe mitral regurgitation. His severe mitral regurgitation was confirmed by TEE. He has planned mitral valve repair surgery for 10/17, which was expedited from 10/24 (his original date) as he was symptomatic with presyncope.   Assessment & Plan  1. Severe mitral regurgitation: Plan for mitral valve surgery repair on 10/17.   2. History of VT and frequent PVC's: Patient wore event monitor that revealed VT and PVC's. He was intolerant of Amiodarone.  Started on 12.5mg  metoprolol and was tolerating well, however had a syncopal event today while preforming his pre-surgical PFT's. Contacted [facility] regarding need for PFT's, they have advised that it is ok to proceed with surgery without PFT's.    Signed, Little Ishikawa, NP  03/15/2016, 10:12 AM    I saw evaluated the patient this morning along with Suzzette Righter, NP-C. We have reviewed the chart together and discussed findings and examination as well as regurgitation noted above.  Unfortunately while getting his PFTs done this morning he had what appeared to be a vasovagal episode. Plan discussed that with him, the main difference during this episode and the episodes of brought him into the hospital or that he did not feel his heart rate going fast and irregular as opposed to the antecedent events. In this case, it was a drop in heart rate as opposed to an increased heart rate.  Unfortunately, we finally have a documented syncopal events and cannot be sure of what the cause of his events aren't home. He has planned mitral valve repair surgery early  next week, and besides this and then this morning has been relatively stable here in hospital and tolerating his low-dose beta blocker well.  Would like to reassess him later onset noon, after he is seen by Dr. Cornelius Moras to determine whether or not he would be safe to go home with activity restrictions and then come in for outpatient surgery next week.  The other option would for him to stay in the hospital until Tuesday which is also not a great option.  No significant arrhythmias noted on telemetry. And tolerating low-dose beta blocker now.  As an addendum to the above note, discussed with Dr. Cornelius Moraswen. The concerning factor is that he was admitted with near syncope episode and had a treatment episode here today. We both feel that is probably the best course of action will be the keep him until he has his surgery on Tuesday during which time we can monitor him for further episodes. Suspect that he may very well end up having confirmation of symptomatically bradycardia that would eventually require pacemaker placement. With his severe MR and now having a true single episode, I feel that the best course of action is to keep an eye on him here in the hospital. See how well he does on the low-dose beta blocker which is keeping the PVCs down. However with his long bradycardic spell I'm a bit concerned about continuing it.    Bryan Lemmaavid Harding, M.D., M.S. Interventional Cardiologist   Pager # [phone] Phone # [phone] [address] [place], [place] [address]

## 2016-03-15 NOTE — Progress Notes (Signed)
Was called by rapped response informed ne mr. Gangwer  Was unable to stand , She  Helped him to the floor, I paged Suzzette Righter the pa with cardiology and in formed her His heart rate went in the 30's at the time, she gave no new orders

## 2016-03-15 NOTE — Care Management Important Message (Signed)
Important Message  Patient Details  Name: CAMDYN BLOEMKER MRN: [id number] Date of Birth: July 04, 1929   Medicare Important Message Given:  Yes    Forbes Loll Abena 03/15/2016, 9:59 AM

## 2016-03-15 NOTE — Progress Notes (Signed)
03/15/2016- Respiratory care note- Pt brought to pulmonary lab this am for pulmonary function test.  Pt completed first 4 stages of testing along with bronchodilator treatment when pt began to feel "light headed".  Rapid response RN called. Pt heart rate dropped into the 30's. Pt had a syncopal episode and gently lowered to the floor. RN from floor came to assist and once pt became stable was placed into wheelchair and taken back to room with RN.

## 2016-03-15 NOTE — Progress Notes (Signed)
EKG done this AM and loaded to EP[facility] is not patient Roy Palmer in 2W12.  Wrong sticker scanned.  Please disregard.

## 2016-03-15 NOTE — Progress Notes (Addendum)
      [address]       [name] [address]             [phone]     CARDIOTHORACIC SURGERY PROGRESS NOTE  Subjective: Currently feels well.  Discussed syncopal episode at length.  Objective: Vital signs in last 24 hours: Temp:  [97.6 F (36.4 C)-98.1 F (36.7 C)] 98.1 F (36.7 C) (10/13 1411) Pulse Rate:  [57-61] 58 (10/13 1411) Cardiac Rhythm: Normal sinus rhythm;Bundle branch block;Heart block (10/12 1900) Resp:  [16-18] 18 (10/13 1411) BP: (117-128)/(58-69) 117/58 (10/13 1411) SpO2:  [95 %-98 %] 98 % (10/13 1411) Weight:  [184 lb 11.2 oz (83.8 kg)] 184 lb 11.2 oz (83.8 kg) (10/13 0538)  Physical Exam:  Rhythm:   Sinus w/ frequent PVC's, bursts of WCT, episodes of bradycardia - strips from syncopal episode this morning reviewed document sustained bradycardia w/ HR 30's, preceded by several short bursts of WCT  Breath sounds: clear  Heart sounds:  RRR w/ holosystolic murmur  Incisions:  n/a  Abdomen:  soft  Extremities:  warm   Intake/Output from previous day: 10/12 0701 - 10/13 0700 In: 240 [P.O.:240] Out: -  Intake/Output this shift: Total I/O In: 240 [P.O.:240] Out: -   Lab Results:  Recent Labs  03/13/16 0231 03/15/16 0430  WBC 8.5 8.5  HGB 14.4 14.5  HCT 42.9 43.3  PLT 159 148*   BMET:  Recent Labs  03/13/16 0231 03/15/16 0430  NA 140 140  K 4.0 4.5  CL 105 106  CO2 28 27  GLUCOSE 90 98  BUN 20 15  CREATININE 1.36* 1.46*  CALCIUM 8.8* 8.9    CBG (last 3)  No results for input(s): GLUCAP in the last 72 hours. PT/INR:   Recent Labs  03/15/16 0430  LABPROT 13.1  INR 0.99    CXR:  CHEST  2 VIEW  COMPARISON:  Chest x-ray of February 29 2016  FINDINGS: The lungs are adequately inflated and clear. The heart and pulmonary vascularity are normal. There is faint calcification in the wall of the aortic arch. There is mild tortuosity of the descending thoracic aorta. There is no pleural effusion. The bony thorax  exhibits no acute abnormality.  IMPRESSION: There is no active cardiopulmonary disease.  Aortic atherosclerosis.   Electronically Signed   By: David  [place] M.D.   On: 03/15/2016 07:42  Assessment/Plan:  Discussed matters at length with Dr. Herbie Baltimore.  I would be reluctant to send the patient home at this time.  If it is felt that he needs PPM placement we could postpone mitral valve repair - I would not wish to proceed with surgery so soon after PPM placement for fear of dislodging fresh leads.  Alternatively, we could proceed with mitral valve repair and defer whether or not to place PPM until post-operatively.  I suspect that going through mitral valve repair will only increase the likelihood that PPM will ultimately be required.    I spent in excess of 15 minutes during the conduct of this hospital encounter and >50% of this time involved direct face-to-face encounter with the patient for counseling and/or coordination of their care.   [name], MD 03/15/2016 3:55 PM

## 2016-03-15 NOTE — Progress Notes (Signed)
Patient in PFT lab, near the end of the testing he became light headed and was pale and clammy.  When standing to sit in the wheel chair, he became limp.  Assisted to lying on the floor.  He then became alert and oriented.  After a few minutes he was assisted back to the wheel chair and transported back to his room.  In his room he was back to his baseline and able to go the bathroom without symptoms.  RN aware of incident and will notify MD.  Per CCMD, patient with bradycardia rate 30s at the time patient was symptomatic.  RN to call if assistance needed.

## 2016-03-16 LAB — HEMOGLOBIN A1C
Hgb A1c MFr Bld: 5.8 % — ABNORMAL HIGH (ref 4.8–5.6)
Mean Plasma Glucose: 120 mg/dL

## 2016-03-16 NOTE — Progress Notes (Signed)
CARDIAC REHAB PHASE I   PRE:  Rate/Rhythm: Sinus 62  BP:  Supine: 124/64       SaO2: 97% on room air  MODE:  Ambulation: 400 ft   POST:  Rate/Rhythem: Sinus 71  BP:    Sitting: 139/71     SaO2: 99% on room air 1135-1156 Patient ambulated in the hallway independently without difficulty. Patient assisted back to bed with family present. Call light within reach.   Thayer Headings RN BSN

## 2016-03-16 NOTE — Progress Notes (Signed)
TELEMETRY: Reviewed telemetry pt in NSR with frequent PVCs.: Vitals:   03/15/16 0538 03/15/16 1411 03/15/16 1953 03/16/16 0500  BP: 127/69 (!) 117/58 130/70 (!) 146/65  Pulse: (!) 57 (!) 58 63 64  Resp: 16 18 18 18   Temp: 97.7 F (36.5 C) 98.1 F (36.7 C) 98 F (36.7 C) 98 F (36.7 C)  TempSrc: Oral Oral Oral Oral  SpO2: 95% 98% 96% 95%  Weight: 184 lb 11.2 oz (83.8 kg)   184 lb 6.4 oz (83.6 kg)  Height:        Intake/Output Summary (Last 24 hours) at 03/16/16 0844 Last data filed at 03/16/16 0827  Gross per 24 hour  Intake              720 ml  Output                0 ml  Net              720 ml   Filed Weights   03/14/16 0454 03/15/16 0538 03/16/16 0500  Weight: 186 lb (84.4 kg) 184 lb 11.2 oz (83.8 kg) 184 lb 6.4 oz (83.6 kg)    Subjective  Feels very well. Syncopal episode when performing PFTs. Notes he was supposed to "blow in that thing" for 4 minutes but only made it 2 minutes before he became lightheaded and passed out. Was bradycardic.   Marland Kitchen. aspirin EC  81 mg Oral Daily  . heparin  5,000 Units Subcutaneous Q8H  . metoprolol tartrate  12.5 mg Oral BID  . pantoprazole  40 mg Oral Daily  . sodium chloride flush  3 mL Intravenous Q12H      LABS: Basic Metabolic Panel:  Recent Labs  74/25/9510/13/17 0430  NA 140  K 4.5  CL 106  CO2 27  GLUCOSE 98  BUN 15  CREATININE 1.46*  CALCIUM 8.9   Liver Function Tests:  Recent Labs  03/15/16 0430  AST 20  ALT 23  ALKPHOS 43  BILITOT 0.7  PROT 6.2*  ALBUMIN 3.6   No results for input(s): LIPASE, AMYLASE in the last 72 hours. CBC:  Recent Labs  03/15/16 0430  WBC 8.5  HGB 14.5  HCT 43.3  MCV 93.1  PLT 148*   Cardiac Enzymes: No results for input(s): CKTOTAL, CKMB, CKMBINDEX, TROPONINI in the last 72 hours. BNP: No results for input(s): PROBNP in the last 72 hours. D-Dimer: No results for input(s): DDIMER in the last 72 hours. Hemoglobin A1C:  Recent Labs  03/15/16 0430  HGBA1C 5.8*   Fasting  Lipid Panel: No results for input(s): CHOL, HDL, LDLCALC, TRIG, CHOLHDL, LDLDIRECT in the last 72 hours. Thyroid Function Tests: No results for input(s): TSH, T4TOTAL, T3FREE, THYROIDAB in the last 72 hours.  Invalid input(s): FREET3   Radiology/Studies:  Dg Chest 2 View  Result Date: 03/15/2016 CLINICAL DATA:  CHF. Preoperative examination for mitral valve replacement. History of thoracic aortic aneurysm. EXAM: CHEST  2 VIEW COMPARISON:  Chest x-ray of February 29 2016 FINDINGS: The lungs are adequately inflated and clear. The heart and pulmonary vascularity are normal. There is faint calcification in the wall of the aortic arch. There is mild tortuosity of the descending thoracic aorta. There is no pleural effusion. The bony thorax exhibits no acute abnormality. IMPRESSION: There is no active cardiopulmonary disease. Aortic atherosclerosis. Electronically Signed   By: David  [place] M.D.   On: 03/15/2016 07:42    PHYSICAL EXAM General: Well developed, well nourished, in  no acute distress. Head: Normocephalic, atraumatic, sclera non-icteric, oropharynx is clear Neck: Negative for carotid bruits. JVD not elevated. No adenopathy Lungs: Clear bilaterally to auscultation without wheezes, rales, or rhonchi. Breathing is unlabored. Heart: RRR S1 S2 with 3/6 systolic murmur at apex.  Abdomen: Soft, non-tender, non-distended with normoactive bowel sounds. No hepatomegaly. No rebound/guarding. No obvious abdominal masses. Msk:  Strength and tone appears normal for age. Extremities: No clubbing, cyanosis or edema.  Distal pedal pulses are 2+ and equal bilaterally. Neuro: Alert and oriented X 3. Moves all extremities spontaneously. Psych:  Responds to questions appropriately with a normal affect.  ASSESSMENT AND PLAN: 1. Severe MR. Plan for MV repair on Tuesday 10/17 with Dr. Cornelius Moras. 2. 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 On beta blocker. Intolerant of amiodarone. 3. Syncope. Suspect more of a vagal  response with hyperventilation during PFTs. No further bradycardia seen. No indication for pacemaker at this point. Will continue to monitor until surgery.  Present on Admission: . Severe mitral regurgitation . AAA (abdominal aortic aneurysm) without rupture (HCC) . Thoracic aortic aneurysm (HCC) . Near syncope -- cardiogenic . Incidental pulmonary nodule . Symptomatic PVCs   Signed, Amandamarie Feggins [place], MDFACC 03/16/2016 8:44 AM

## 2016-03-17 NOTE — Progress Notes (Signed)
TELEMETRY: Reviewed telemetry pt in NSR with frequent PVCs. No bradycardia or pauses: Vitals:   03/16/16 1416 03/16/16 2023 03/16/16 2123 03/17/16 0535  BP: 119/67 134/70  128/81  Pulse: 60 (!) 58 63 64  Resp: 16 18  18   Temp: 97.6 F (36.4 C) 98.2 F (36.8 C)  97.8 F (36.6 C)  TempSrc: Oral Oral  Oral  SpO2: 96% 95%  94%  Weight:    184 lb (83.5 kg)  Height:        Intake/Output Summary (Last 24 hours) at 03/17/16 0748 Last data filed at 03/17/16 0500  Gross per 24 hour  Intake             1200 ml  Output                0 ml  Net             1200 ml   Filed Weights   03/15/16 0538 03/16/16 0500 03/17/16 0535  Weight: 184 lb 11.2 oz (83.8 kg) 184 lb 6.4 oz (83.6 kg) 184 lb (83.5 kg)    Subjective  Feels very well. No dizziness or syncope. Ambulating without problems.  Marland Kitchen aspirin EC  81 mg Oral Daily  . heparin  5,000 Units Subcutaneous Q8H  . metoprolol tartrate  12.5 mg Oral BID  . pantoprazole  40 mg Oral Daily  . sodium chloride flush  3 mL Intravenous Q12H      LABS: Basic Metabolic Panel:  Recent Labs  60/60/04 0430  NA 140  K 4.5  CL 106  CO2 27  GLUCOSE 98  BUN 15  CREATININE 1.46*  CALCIUM 8.9   Liver Function Tests:  Recent Labs  03/15/16 0430  AST 20  ALT 23  ALKPHOS 43  BILITOT 0.7  PROT 6.2*  ALBUMIN 3.6   No results for input(s): LIPASE, AMYLASE in the last 72 hours. CBC:  Recent Labs  03/15/16 0430  WBC 8.5  HGB 14.5  HCT 43.3  MCV 93.1  PLT 148*   Cardiac Enzymes: No results for input(s): CKTOTAL, CKMB, CKMBINDEX, TROPONINI in the last 72 hours. BNP: No results for input(s): PROBNP in the last 72 hours. D-Dimer: No results for input(s): DDIMER in the last 72 hours. Hemoglobin A1C:  Recent Labs  03/15/16 0430  HGBA1C 5.8*   Fasting Lipid Panel: No results for input(s): CHOL, HDL, LDLCALC, TRIG, CHOLHDL, LDLDIRECT in the last 72 hours. Thyroid Function Tests: No results for input(s): TSH, T4TOTAL, T3FREE,  THYROIDAB in the last 72 hours.  Invalid input(s): FREET3   Radiology/Studies:  No results found.  PHYSICAL EXAM General: Well developed, well nourished, in no acute distress. Head: Normocephalic, atraumatic, sclera non-icteric, oropharynx is clear Neck: Negative for carotid bruits. JVD not elevated. No adenopathy Lungs: Clear bilaterally to auscultation without wheezes, rales, or rhonchi. Breathing is unlabored. Heart: RRR S1 S2 with 3/6 systolic murmur at apex.  Abdomen: Soft, non-tender, non-distended with normoactive bowel sounds. No hepatomegaly. No rebound/guarding. No obvious abdominal masses. Msk:  Strength and tone appears normal for age. Extremities: No clubbing, cyanosis or edema.  Distal pedal pulses are 2+ and equal bilaterally. Neuro: Alert and oriented X 3. Moves all extremities spontaneously. Psych:  Responds to questions appropriately with a normal affect.  ASSESSMENT AND PLAN: 1. Severe MR. Plan for MV repair on Tuesday 10/17 with Dr. Cornelius Moras. 2. 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 On beta blocker. Intolerant of amiodarone. 3. Syncope. Suspect more of a vagal  response with hyperventilation during PFTs. No further bradycardia seen. No indication for pacemaker at this point. Will continue to monitor until surgery.   Present on Admission: . Severe mitral regurgitation . AAA (abdominal aortic aneurysm) without rupture (HCC) . Thoracic aortic aneurysm (HCC) . Near syncope -- cardiogenic . Incidental pulmonary nodule . Symptomatic PVCs   Signed, Arnulfo Batson [place], MDFACC 03/17/2016 7:48 AM

## 2016-03-18 ENCOUNTER — Ambulatory Visit: Payer: Medicare Other | Admitting: Thoracic Surgery (Cardiothoracic Vascular Surgery)

## 2016-03-18 DIAGNOSIS — N183 Chronic kidney disease, stage 3 unspecified: Secondary | ICD-10-CM | POA: Diagnosis present

## 2016-03-18 DIAGNOSIS — I2510 Atherosclerotic heart disease of native coronary artery without angina pectoris: Secondary | ICD-10-CM | POA: Diagnosis present

## 2016-03-18 DIAGNOSIS — E785 Hyperlipidemia, unspecified: Secondary | ICD-10-CM | POA: Diagnosis present

## 2016-03-18 DIAGNOSIS — I130 Hypertensive heart and chronic kidney disease with heart failure and stage 1 through stage 4 chronic kidney disease, or unspecified chronic kidney disease: Secondary | ICD-10-CM | POA: Diagnosis present

## 2016-03-18 DIAGNOSIS — I1310 Hypertensive heart and chronic kidney disease without heart failure, with stage 1 through stage 4 chronic kidney disease, or unspecified chronic kidney disease: Secondary | ICD-10-CM | POA: Diagnosis present

## 2016-03-18 LAB — TYPE AND SCREEN
ABO/RH(D): A NEG
Antibody Screen: NEGATIVE

## 2016-03-18 LAB — SURGICAL PCR SCREEN
MRSA, PCR: NEGATIVE
Staphylococcus aureus: NEGATIVE

## 2016-03-18 MED ORDER — DEXTROSE 5 % IV SOLN
30.0000 ug/min | INTRAVENOUS | Status: AC
  Administered 2016-03-19: 25 ug/min via INTRAVENOUS
  Filled 2016-03-18: qty 2

## 2016-03-18 MED ORDER — TEMAZEPAM 15 MG PO CAPS: 15.0000 mg | ORAL_CAPSULE | Freq: Once | ORAL | Status: DC | PRN

## 2016-03-18 MED ORDER — NITROGLYCERIN IN D5W 200-5 MCG/ML-% IV SOLN
2.0000 ug/min | INTRAVENOUS | Status: DC
  Filled 2016-03-18: qty 250

## 2016-03-18 MED ORDER — CHLORHEXIDINE GLUCONATE 0.12 % MT SOLN
15.0000 mL | Freq: Once | OROMUCOSAL | Status: DC
  Filled 2016-03-18: qty 15

## 2016-03-18 MED ORDER — VANCOMYCIN HCL 10 G IV SOLR
1500.0000 mg | INTRAVENOUS | Status: AC
  Administered 2016-03-19: 1500 mg via INTRAVENOUS
  Filled 2016-03-18 (×2): qty 1500

## 2016-03-18 MED ORDER — ATORVASTATIN CALCIUM 20 MG PO TABS
20.0000 mg | ORAL_TABLET | Freq: Every day | ORAL | Status: DC
  Filled 2016-03-18: qty 1

## 2016-03-18 MED ORDER — DEXMEDETOMIDINE HCL IN NACL 400 MCG/100ML IV SOLN
0.1000 ug/kg/h | INTRAVENOUS | Status: AC
  Administered 2016-03-19: .3 ug/kg/h via INTRAVENOUS
  Filled 2016-03-18: qty 100

## 2016-03-18 MED ORDER — PLASMA-LYTE 148 IV SOLN
INTRAVENOUS | Status: AC
  Administered 2016-03-19: 500 mL
  Filled 2016-03-18: qty 2.5

## 2016-03-18 MED ORDER — MAGNESIUM SULFATE 50 % IJ SOLN
40.0000 meq | INTRAMUSCULAR | Status: DC
  Filled 2016-03-18: qty 10

## 2016-03-18 MED ORDER — SODIUM CHLORIDE 0.9 % IV SOLN
INTRAVENOUS | Status: DC
  Filled 2016-03-18: qty 30

## 2016-03-18 MED ORDER — BISACODYL 5 MG PO TBEC
5.0000 mg | DELAYED_RELEASE_TABLET | Freq: Once | ORAL | Status: DC
  Filled 2016-03-18: qty 1

## 2016-03-18 MED ORDER — METOPROLOL TARTRATE 12.5 MG HALF TABLET: 12.5000 mg | ORAL_TABLET | Freq: Once | ORAL | Status: DC

## 2016-03-18 MED ORDER — DOPAMINE-DEXTROSE 3.2-5 MG/ML-% IV SOLN
0.0000 ug/kg/min | INTRAVENOUS | Status: DC
  Filled 2016-03-18: qty 250

## 2016-03-18 MED ORDER — TRANEXAMIC ACID (OHS) BOLUS VIA INFUSION
15.0000 mg/kg | INTRAVENOUS | Status: DC
  Filled 2016-03-18: qty 1253

## 2016-03-18 MED ORDER — POTASSIUM CHLORIDE 2 MEQ/ML IV SOLN
80.0000 meq | INTRAVENOUS | Status: DC
  Filled 2016-03-18: qty 40

## 2016-03-18 MED ORDER — DEXTROSE 5 % IV SOLN
1.5000 g | INTRAVENOUS | Status: AC
  Administered 2016-03-19: .75 g via INTRAVENOUS
  Administered 2016-03-19: 1.5 g via INTRAVENOUS
  Filled 2016-03-18: qty 1.5

## 2016-03-18 MED ORDER — CHLORHEXIDINE GLUCONATE 4 % EX LIQD
60.0000 mL | Freq: Once | CUTANEOUS | Status: AC
  Administered 2016-03-18: 4 via TOPICAL
  Filled 2016-03-18 (×2): qty 60
  Filled 2016-03-18: qty 15

## 2016-03-18 MED ORDER — SODIUM CHLORIDE 0.9 % IV SOLN
INTRAVENOUS | Status: AC
  Administered 2016-03-19: 1 [IU]/h via INTRAVENOUS
  Filled 2016-03-18: qty 2.5

## 2016-03-18 MED ORDER — CHLORHEXIDINE GLUCONATE 4 % EX LIQD
60.0000 mL | Freq: Once | CUTANEOUS | Status: DC
  Filled 2016-03-18: qty 15

## 2016-03-18 MED ORDER — TRANEXAMIC ACID 1000 MG/10ML IV SOLN
1.5000 mg/kg/h | INTRAVENOUS | Status: AC
  Administered 2016-03-19: 1.5 mg/kg/h via INTRAVENOUS
  Filled 2016-03-18: qty 25

## 2016-03-18 MED ORDER — TRANEXAMIC ACID (OHS) PUMP PRIME SOLUTION
2.0000 mg/kg | INTRAVENOUS | Status: AC
  Administered 2016-03-19: 1260 mg
  Filled 2016-03-18: qty 1.67

## 2016-03-18 MED ORDER — DEXTROSE 5 % IV SOLN
750.0000 mg | INTRAVENOUS | Status: DC
  Filled 2016-03-18 (×2): qty 750

## 2016-03-18 MED ORDER — DEXTROSE 5 % IV SOLN
0.0000 ug/min | INTRAVENOUS | Status: DC
  Filled 2016-03-18: qty 4

## 2016-03-18 NOTE — Progress Notes (Signed)
CARDIAC REHAB PHASE I   PRE:  Rate/Rhythm: 66 SR  BP:  Supine: 124/73  Sitting:   Standing:    SaO2: 94%RA  MODE:  Ambulation: 550 ft   POST:  Rate/Rhythm: 77  BP:  Supine: 134/52  Sitting:   Standing:    SaO2: 97%RA 8299-3716 Pt walked 550 ft on RA with hand held asst and steady gait. No dizziness. Will follow up after surgery. No questions re surgery.   Luetta Nutting, RN BSN  03/18/2016 9:43 AM

## 2016-03-18 NOTE — Care Management Important Message (Signed)
Important Message  Patient Details  Name: Roy Palmer MRN: [id number] Date of Birth: 1930-05-08   Medicare Important Message Given:  Yes    Shadavia Dampier Abena 03/18/2016, 1:29 PM

## 2016-03-18 NOTE — Progress Notes (Addendum)
      [address]       [name] [address]             [phone]     CARDIOTHORACIC SURGERY PROGRESS NOTE  Subjective: No complaints. Had a quiet weakened. Denies palpitations or dizzy spells.  Objective: Vital signs in last 24 hours: Temp:  [98.1 F (36.7 C)-98.5 F (36.9 C)] 98.2 F (36.8 C) (10/16 0520) Pulse Rate:  [60-83] 83 (10/16 0938) Cardiac Rhythm: Heart block;Sinus bradycardia;Bundle branch block (10/16 0700) Resp:  [18] 18 (10/16 0520) BP: (126-131)/(69) 126/69 (10/16 0520) SpO2:  [96 %-97 %] 97 % (10/16 0520) Weight:  [184 lb (83.5 kg)] 184 lb (83.5 kg) (10/16 0520)  Physical Exam:  Rhythm:   Sinus w/ PAC's, PVC/s  Breath sounds: clear  Heart sounds:  RRR w/ holosystolic murmur  Incisions:  n/a  Abdomen:  soft  Extremities:  warm   Intake/Output from previous day: 10/15 0701 - 10/16 0700 In: 1080 [P.O.:1080] Out: -  Intake/Output this shift: No intake/output data recorded.  Lab Results: No results for input(s): WBC, HGB, HCT, PLT in the last 72 hours. BMET: No results for input(s): NA, K, CL, CO2, GLUCOSE, BUN, CREATININE, CALCIUM in the last 72 hours.  CBG (last 3)  No results for input(s): GLUCAP in the last 72 hours. PT/INR:  No results for input(s): LABPROT, INR in the last 72 hours.  CXR:  CHEST  2 VIEW  COMPARISON:  Chest x-ray of February 29 2016  FINDINGS: The lungs are adequately inflated and clear. The heart and pulmonary vascularity are normal. There is faint calcification in the wall of the aortic arch. There is mild tortuosity of the descending thoracic aorta. There is no pleural effusion. The bony thorax exhibits no acute abnormality.  IMPRESSION: There is no active cardiopulmonary disease.  Aortic atherosclerosis.   Electronically Signed   By: David  [place] M.D.   On: 03/15/2016 07:42  Assessment/Plan:  The patient and his wife were again counseled at length regarding the indications, risks  and potential benefits of mitral valve repair.  The rationale for elective surgery has been explained, including a comparison between surgery and continued medical therapy with close follow-up.  Expectations for the patient's postoperative recovery have been discussed.   The patient understands and accepts all potential risks of surgery including but not limited to risk of death, stroke or other neurologic complication, myocardial infarction, congestive heart failure, respiratory failure, renal failure, bleeding requiring transfusion and/or reexploration, arrhythmia, infection or other wound complications, pneumonia, pleural and/or pericardial effusion, pulmonary embolus, aortic dissection or other major vascular complication, or delayed complications related to valve repair or replacement including but not limited to structural valve deterioration and failure, thrombosis, embolization, endocarditis, or paravalvular leak.  All of their questions have been answered.  For OR tomorrow morning.   I spent in excess of 15 minutes during the conduct of this hospital encounter and >50% of this time involved direct face-to-face encounter with the patient for counseling and/or coordination of their care.   [name], MD 03/18/2016  9:22am

## 2016-03-18 NOTE — Anesthesia Preprocedure Evaluation (Addendum)
Anesthesia Evaluation  Patient identified by MRN, date of birth, ID band Patient awake    Reviewed: Allergy & Precautions, H&P , NPO status , Patient's Chart, lab work & pertinent test results  Airway Mallampati: III  TM Distance: >3 FB Neck ROM: Full    Dental no notable dental hx. (+) Teeth Intact, Dental Advisory Given   Pulmonary neg pulmonary ROS,    Pulmonary exam normal breath sounds clear to auscultation       Cardiovascular hypertension, Pt. on medications + Peripheral Vascular Disease  + Valvular Problems/Murmurs MR  Rhythm:Regular Rate:Normal     Neuro/Psych negative neurological ROS  negative psych ROS   GI/Hepatic negative GI ROS, Neg liver ROS,   Endo/Other  negative endocrine ROS  Renal/GU Renal disease  negative genitourinary   Musculoskeletal   Abdominal   Peds  Hematology negative hematology ROS (+)   Anesthesia Other Findings   Reproductive/Obstetrics negative OB ROS                            Anesthesia Physical Anesthesia Plan  ASA: IV  Anesthesia Plan: General   Post-op Pain Management:    Induction: Intravenous  Airway Management Planned: Double Lumen EBT  Additional Equipment: Arterial line, CVP, PA Cath, TEE, Ultrasound Guidance Line Placement and 3D TEE  Intra-op Plan:   Post-operative Plan: Post-operative intubation/ventilation  Informed Consent: I have reviewed the patients History and Physical, chart, labs and discussed the procedure including the risks, benefits and alternatives for the proposed anesthesia with the patient or authorized representative who has indicated his/her understanding and acceptance.   Dental advisory given  Plan Discussed with: CRNA  Anesthesia Plan Comments:        Anesthesia Quick Evaluation

## 2016-03-18 NOTE — Progress Notes (Signed)
Patient Name: Roy Palmer Date of Encounter: 03/18/2016  Primary Cardiologist: Dr. Alvester Chou Problem List     Active Problems:   Severe mitral regurgitation   Near syncope -- cardiogenic   Symptomatic PVCs   Non-sustained ventricular tachycardia (HCC)   Incidental pulmonary nodule   AAA (abdominal aortic aneurysm) without rupture (HCC)   Thoracic aortic aneurysm (HCC)   Vasovagal syncope   Mild CAD   Essential hypertension   CKD (chronic kidney disease), stage III   Hypertensive heart and kidney disease   Hyperlipidemia    Subjective   Feeling well, no CP, SOB, dizziness.   Inpatient Medications    . aspirin EC  81 mg Oral Daily  . heparin  5,000 Units Subcutaneous Q8H  . metoprolol tartrate  12.5 mg Oral BID  . pantoprazole  40 mg Oral Daily  . sodium chloride flush  3 mL Intravenous Q12H    Vital Signs    Vitals:   03/17/16 0535 03/17/16 1432 03/17/16 1936 03/18/16 0520  BP: 128/81 127/69 131/69 126/69  Pulse: 64 61 60 61  Resp: 18 18 18 18   Temp: 97.8 F (36.6 C) 98.1 F (36.7 C) 98.5 F (36.9 C) 98.2 F (36.8 C)  TempSrc: Oral Oral Oral Oral  SpO2: 94% 96% 96% 97%  Weight: 184 lb (83.5 kg)   184 lb (83.5 kg)  Height:        Intake/Output Summary (Last 24 hours) at 03/18/16 0825 Last data filed at 03/17/16 1700  Gross per 24 hour  Intake             1080 ml  Output                0 ml  Net             1080 ml   Filed Weights   03/16/16 0500 03/17/16 0535 03/18/16 0520  Weight: 184 lb 6.4 oz (83.6 kg) 184 lb (83.5 kg) 184 lb (83.5 kg)    Physical Exam    General: Well developed, well nourished WM, in no acute distress. HEENT: Normocephalic, atraumatic, sclera non-icteric, no xanthomas, nares are without discharge. Neck: Negative for carotid bruits. JVP not elevated. Lungs: Clear bilaterally to auscultation without wheezes, rales, or rhonchi. Breathing is unlabored. Cardiac: RRR freq ectopy with holosystolic SEM heard best at apex  but also over precordium, no rubs or gallops.  Abdomen: Soft, non-tender, non-distended with normoactive bowel sounds. No rebound/guarding. Extremities: No clubbing or cyanosis. No edema. Distal pedal pulses are 2+ and equal bilaterally. Skin: Warm and dry, no significant rash. Neuro: Alert and oriented X 3. Strength and sensation in tact. Psych:  Responds to questions appropriately with a normal affect.  Labs    Last labs 10/12 - Cr 1.46, K 4.5 Prior Mg 2.5  Telemetry    NSR freq PVCs, bigeminy, brief triplets  Radiology    Dg Chest 2 View  Result Date: 03/15/2016 CLINICAL DATA:  CHF. Preoperative examination for mitral valve replacement. History of thoracic aortic aneurysm. EXAM: CHEST  2 VIEW COMPARISON:  Chest x-ray of February 29 2016 FINDINGS: The lungs are adequately inflated and clear. The heart and pulmonary vascularity are normal. There is faint calcification in the wall of the aortic arch. There is mild tortuosity of the descending thoracic aorta. There is no pleural effusion. The bony thorax exhibits no acute abnormality. IMPRESSION: There is no active cardiopulmonary disease. Aortic atherosclerosis. Electronically Signed   By: David  [place]  M.D.   On: 03/15/2016 07:42   Dg Chest 2 View  Result Date: 02/29/2016 CLINICAL DATA:  Dizziness for 2 weeks, hypotension, recent cardiac catheterization EXAM: CHEST  2 VIEW COMPARISON:  Chest x-ray of 02/20/2016 FINDINGS: No active infiltrate or effusion is seen. Mediastinal and hilar contours are unremarkable. Mild cardiomegaly is stable. No acute bony abnormality is seen. IMPRESSION: No active cardiopulmonary disease. Electronically Signed   By: Dwyane Dee M.D.   On: 02/29/2016 11:47   Dg Chest 2 View  Result Date: 02/20/2016 CLINICAL DATA:  Preop heart catheterization. EXAM: CHEST  2 VIEW COMPARISON:  None. FINDINGS: Heart is borderline in size. Lungs are clear. No effusions. No acute bony abnormality. IMPRESSION: No active  cardiopulmonary disease. Electronically Signed   By: Charlett Nose M.D.   On: 02/20/2016 12:51   Ct Angio Chest Aorta W &/or Wo Contrast  Result Date: 03/12/2016 CLINICAL DATA:  Thoracic aortic aneurysm. EXAM: CT ANGIOGRAPHY CHEST, ABDOMEN AND PELVIS TECHNIQUE: Multidetector CT imaging through the chest, abdomen and pelvis was performed using the standard protocol during bolus administration of intravenous contrast. Multiplanar reconstructed images and MIPs were obtained and reviewed to evaluate the vascular anatomy. CONTRAST:  100 mL Isovue 370 COMPARISON:  None. FINDINGS: CTA CHEST FINDINGS Cardiovascular: Preferential opacification of the thoracic aorta. No evidence of thoracic aortic dissection. Distal aortic arch measures 3.5 cm in diameter. Ascending thoracic aorta measures 3.5 cm in AP dimension at the level of the right main pulmonary artery. Descending thoracic aortic aneurysm measuring 3.1 cm in AP diameter at the level of the right main pulmonary artery. Mild thoracic aortic atherosclerosis. Mild coronary artery atherosclerosis in the LAD. Enlarged heart size. No pericardial effusion. Mediastinum/Nodes: No enlarged mediastinal, hilar, or axillary lymph nodes. Thyroid gland, trachea, and esophagus demonstrate no significant findings. Lungs/Pleura: 2.2 x 2.7 cm focal area of airspace disease at the right lung base which may reflect pneumonia versus atelectasis versus less likely neoplasm. No pleural effusion or pneumothorax. Musculoskeletal: No acute osseous abnormality. No lytic or sclerotic osseous lesion. Review of the MIP images confirms the above findings. CTA ABDOMEN AND PELVIS FINDINGS VASCULAR Aorta: Abdominal aortic atherosclerosis. Infrarenal abdominal aortic aneurysm aneurysm measuring 3.5 (AP) x 3.3 (transverse). Aneurysm measures 5.2 cm in length. The neck of the aneurysm is approximately 2.2 cm from the right renal artery origin. Right common iliac artery measures 1.7 cm in diameter. Left  common iliac artery measures 1.4 cm in diameter. Mild atherosclerotic plaque in bilateral common iliac arteries. Mild atherosclerotic plaque in bilateral external iliac arteries. Celiac: Patent. SMA: Patent. Renals: Patent right renal artery with minimal atherosclerotic plaque at the origin. Patent left renal artery with mild atherosclerotic plaque resulting in less than 50% focal stenosis. IMA: Patent with mild atherosclerotic plaque at the origin. Inflow: Patent without evidence of dissection, vasculitis or significant stenosis. Veins: No obvious venous abnormality within the limitations of this arterial phase study. Review of the MIP images confirms the above findings. NON-VASCULAR Hepatobiliary: No focal liver abnormality is seen. No gallstones, gallbladder wall thickening, or biliary dilatation. Pancreas: Unremarkable. No pancreatic ductal dilatation or surrounding inflammatory changes. Spleen: Normal in size without focal abnormality. Adrenals/Urinary Tract: Adrenal glands are unremarkable. Kidneys are normal, without renal calculi, focal lesion, or hydronephrosis. Bladder is unremarkable. Stomach/Bowel: Stomach is within normal limits. Appendix appears normal. No evidence of bowel wall thickening, distention, or inflammatory changes. Diverticulosis without evidence of diverticulitis. Lymphatic: No lymphadenopathy. Reproductive: Prostate is unremarkable. Other: No abdominal wall hernia or abnormality. No abdominopelvic  ascites. Musculoskeletal: No acute osseous abnormality. No aggressive lytic or sclerotic osseous lesion. Osteoarthritis of bilateral sacroiliac joints. Degenerative disc disease with disc height loss at L5-S1 with bilateral facet arthropathy. Review of the MIP images confirms the above findings. IMPRESSION: 1. Distal aortic arch measures 3.5 cm in diameter. Recommend annual imaging followup by CTA or MRA. This recommendation follows 2010 ACCF/AHA/AATS/ACR/ASA/SCA/SCAI/SIR/STS/SVM Guidelines for  the Diagnosis and Management of Patients with Thoracic Aortic Disease. Circulation.2010; 121: Z308-[id number] 2. Infrarenal abdominal aortic aneurysm measuring 3.5 x 3.3 cm. Recommend followup by ultrasound in 2 years. This recommendation follows ACR consensus guidelines: White Paper of the ACR Incidental Findings Committee II on Vascular Findings. J Am Coll Radiol 2013; 10:789-794. 3. Diverticulosis without evidence of diverticulitis. 4. 2.2 x 2.7 cm focal area of airspace disease at the right lung base which may reflect pneumonia versus atelectasis versus less likely neoplasm. Correlate with laboratory values. Followup CT chest is recommended in 4-6 weeks following trial of antibiotic therapy to ensure resolution and exclude underlying malignancy. 5.  Aortic Atherosclerosis (ICD10-170.0) Electronically Signed   By: Elige KoHetal  Patel   On: 03/12/2016 15:48   Ct Angio Abd/pel W/ And/or W/o  Result Date: 03/12/2016 CLINICAL DATA:  Thoracic aortic aneurysm. EXAM: CT ANGIOGRAPHY CHEST, ABDOMEN AND PELVIS TECHNIQUE: Multidetector CT imaging through the chest, abdomen and pelvis was performed using the standard protocol during bolus administration of intravenous contrast. Multiplanar reconstructed images and MIPs were obtained and reviewed to evaluate the vascular anatomy. CONTRAST:  100 mL Isovue 370 COMPARISON:  None. FINDINGS: CTA CHEST FINDINGS Cardiovascular: Preferential opacification of the thoracic aorta. No evidence of thoracic aortic dissection. Distal aortic arch measures 3.5 cm in diameter. Ascending thoracic aorta measures 3.5 cm in AP dimension at the level of the right main pulmonary artery. Descending thoracic aortic aneurysm measuring 3.1 cm in AP diameter at the level of the right main pulmonary artery. Mild thoracic aortic atherosclerosis. Mild coronary artery atherosclerosis in the LAD. Enlarged heart size. No pericardial effusion. Mediastinum/Nodes: No enlarged mediastinal, hilar, or axillary lymph nodes.  Thyroid gland, trachea, and esophagus demonstrate no significant findings. Lungs/Pleura: 2.2 x 2.7 cm focal area of airspace disease at the right lung base which may reflect pneumonia versus atelectasis versus less likely neoplasm. No pleural effusion or pneumothorax. Musculoskeletal: No acute osseous abnormality. No lytic or sclerotic osseous lesion. Review of the MIP images confirms the above findings. CTA ABDOMEN AND PELVIS FINDINGS VASCULAR Aorta: Abdominal aortic atherosclerosis. Infrarenal abdominal aortic aneurysm aneurysm measuring 3.5 (AP) x 3.3 (transverse). Aneurysm measures 5.2 cm in length. The neck of the aneurysm is approximately 2.2 cm from the right renal artery origin. Right common iliac artery measures 1.7 cm in diameter. Left common iliac artery measures 1.4 cm in diameter. Mild atherosclerotic plaque in bilateral common iliac arteries. Mild atherosclerotic plaque in bilateral external iliac arteries. Celiac: Patent. SMA: Patent. Renals: Patent right renal artery with minimal atherosclerotic plaque at the origin. Patent left renal artery with mild atherosclerotic plaque resulting in less than 50% focal stenosis. IMA: Patent with mild atherosclerotic plaque at the origin. Inflow: Patent without evidence of dissection, vasculitis or significant stenosis. Veins: No obvious venous abnormality within the limitations of this arterial phase study. Review of the MIP images confirms the above findings. NON-VASCULAR Hepatobiliary: No focal liver abnormality is seen. No gallstones, gallbladder wall thickening, or biliary dilatation. Pancreas: Unremarkable. No pancreatic ductal dilatation or surrounding inflammatory changes. Spleen: Normal in size without focal abnormality. Adrenals/Urinary Tract: Adrenal glands are unremarkable. Kidneys are  normal, without renal calculi, focal lesion, or hydronephrosis. Bladder is unremarkable. Stomach/Bowel: Stomach is within normal limits. Appendix appears normal. No  evidence of bowel wall thickening, distention, or inflammatory changes. Diverticulosis without evidence of diverticulitis. Lymphatic: No lymphadenopathy. Reproductive: Prostate is unremarkable. Other: No abdominal wall hernia or abnormality. No abdominopelvic ascites. Musculoskeletal: No acute osseous abnormality. No aggressive lytic or sclerotic osseous lesion. Osteoarthritis of bilateral sacroiliac joints. Degenerative disc disease with disc height loss at L5-S1 with bilateral facet arthropathy. Review of the MIP images confirms the above findings. IMPRESSION: 1. Distal aortic arch measures 3.5 cm in diameter. Recommend annual imaging followup by CTA or MRA. This recommendation follows 2010 ACCF/AHA/AATS/ACR/ASA/SCA/SCAI/SIR/STS/SVM Guidelines for the Diagnosis and Management of Patients with Thoracic Aortic Disease. Circulation.2010; 121: S283-T517 2. Infrarenal abdominal aortic aneurysm measuring 3.5 x 3.3 cm. Recommend followup by ultrasound in 2 years. This recommendation follows ACR consensus guidelines: White Paper of the ACR Incidental Findings Committee II on Vascular Findings. J Am Coll Radiol 2013; 10:789-794. 3. Diverticulosis without evidence of diverticulitis. 4. 2.2 x 2.7 cm focal area of airspace disease at the right lung base which may reflect pneumonia versus atelectasis versus less likely neoplasm. Correlate with laboratory values. Followup CT chest is recommended in 4-6 weeks following trial of antibiotic therapy to ensure resolution and exclude underlying malignancy. 5.  Aortic Atherosclerosis (ICD10-170.0) Electronically Signed   By: Elige Ko   On: 03/12/2016 15:48     Patient Profile     [phone] with severe mitral regurgitation, CKD stage III, HTN, hypertensive heart and kidney disease (LVEF 50%), HLD, infrarenal AAA 3.5x3.3cm as well as 3.5cm distal aortic arch by CT 03/2016,  focal airspace disease abnormality by CT 03/2016 (recommended fu CT 4-6 weeks), trigeminal neuralgia, mild  nonobstructive CAD by cath 02/23/16, and freq PVCs/NSVT (up to 9 beats)/sinus bradycardia (min 49)/PSVT by Holter 02/27/16. Initially started on amio after cath for freq PVCs but was intolerant due to hallucinations. Was pending MV surgery 10/24 but presented with worsening symptoms of near-syncope. On 03/15/16 had episode of syncope during PFTs with HR drop into 30s felt possibly vasovagal in nature. Now pending MV repair on 03/19/16.  Assessment & Plan    1. Mitral regurgitation - pending MV repair 03/19/16 with TCTS.  2. Syncope -  Per MD, suspected more of a vagal response with hyperventilation during PFTs. No further bradycardia seen. No indication for pacemaker at this point. Will continue to monitor until surgery.   3. Frequent PVCs/NSVT/PSVT/sinus bradycardia - intolerant of amiodarone due to nightmares. Tolerating BB. May need referral to EP at some point if PVCs do not improve s/p surgery.  4. Mild CAD - add back intended statin per cath note. Pt did not realize this was prescribed.  5. Focal area of airspace disease RLB - no symptoms to suggest PNA. Per radiology suggestions would recommend f/u CT in 4-6 weeks.  6. HTN - controlled.  Signed, Laurann Montana, PA-C  03/18/2016, 8:25 AM   I have personally seen and examined this patient. I agree with the assessment and plan as outlined above. He is stable this am awaiting MV repair tomorrow. Telemetry shows sinus rhythm with PVCs. He may need a pacemaker post MV repair. Will follow closely. My exam today shows a well developed male in NAD. CV:RRR with systolic murmur. Pulm: clear bilaterally. No LE edema.   Verne Carrow 03/18/2016 9:49 AM

## 2016-03-19 ENCOUNTER — Other Ambulatory Visit (HOSPITAL_COMMUNITY): Payer: Self-pay

## 2016-03-19 ENCOUNTER — Encounter (HOSPITAL_COMMUNITY): Payer: Self-pay

## 2016-03-19 ENCOUNTER — Encounter (HOSPITAL_COMMUNITY)
Admission: AD | Disposition: A | Discharge status: Home / Self Care | Payer: Self-pay | Source: Ambulatory Visit | Attending: Thoracic Surgery (Cardiothoracic Vascular Surgery)

## 2016-03-19 ENCOUNTER — Inpatient Hospital Stay (HOSPITAL_COMMUNITY): Payer: Medicare Other | Admitting: Anesthesiology

## 2016-03-19 ENCOUNTER — Inpatient Hospital Stay: Admit: 2016-03-19 | Payer: Medicare Other | Admitting: Thoracic Surgery (Cardiothoracic Vascular Surgery)

## 2016-03-19 ENCOUNTER — Inpatient Hospital Stay (HOSPITAL_COMMUNITY): Payer: Medicare Other

## 2016-03-19 DIAGNOSIS — Z9889 Other specified postprocedural states: Secondary | ICD-10-CM

## 2016-03-19 HISTORY — PX: MITRAL VALVE REPAIR: SHX2039

## 2016-03-19 HISTORY — PX: TEE WITHOUT CARDIOVERSION: SHX5443

## 2016-03-19 HISTORY — DX: Other specified postprocedural states: Z98.890

## 2016-03-19 LAB — POCT I-STAT, CHEM 8
BUN: 20 mg/dL (ref 6–20)
BUN: 17 mg/dL (ref 6–20)
BUN: 18 mg/dL (ref 6–20)
BUN: 18 mg/dL (ref 6–20)
BUN: 18 mg/dL (ref 6–20)
BUN: 18 mg/dL (ref 6–20)
BUN: 17 mg/dL (ref 6–20)
Calcium, Ion: 1.24 mmol/L (ref 1.15–1.40)
Calcium, Ion: 1.01 mmol/L — ABNORMAL LOW (ref 1.15–1.40)
Calcium, Ion: 1.19 mmol/L (ref 1.15–1.40)
Calcium, Ion: 1.08 mmol/L — ABNORMAL LOW (ref 1.15–1.40)
Calcium, Ion: 1.05 mmol/L — ABNORMAL LOW (ref 1.15–1.40)
Calcium, Ion: 1.09 mmol/L — ABNORMAL LOW (ref 1.15–1.40)
Calcium, Ion: 1.1 mmol/L — ABNORMAL LOW (ref 1.15–1.40)
Chloride: 101 mmol/L (ref 101–111)
Chloride: 100 mmol/L — ABNORMAL LOW (ref 101–111)
Chloride: 102 mmol/L (ref 101–111)
Chloride: 103 mmol/L (ref 101–111)
Chloride: 106 mmol/L (ref 101–111)
Chloride: 104 mmol/L (ref 101–111)
Chloride: 106 mmol/L (ref 101–111)
Creatinine, Ser: 1.2 mg/dL (ref 0.61–1.24)
Creatinine, Ser: 0.9 mg/dL (ref 0.61–1.24)
Creatinine, Ser: 1.2 mg/dL (ref 0.61–1.24)
Creatinine, Ser: 1.1 mg/dL (ref 0.61–1.24)
Creatinine, Ser: 1.1 mg/dL (ref 0.61–1.24)
Creatinine, Ser: 1.1 mg/dL (ref 0.61–1.24)
Creatinine, Ser: 1.1 mg/dL (ref 0.61–1.24)
Glucose, Bld: 102 mg/dL — ABNORMAL HIGH (ref 65–99)
Glucose, Bld: 105 mg/dL — ABNORMAL HIGH (ref 65–99)
Glucose, Bld: 115 mg/dL — ABNORMAL HIGH (ref 65–99)
Glucose, Bld: 128 mg/dL — ABNORMAL HIGH (ref 65–99)
Glucose, Bld: 124 mg/dL — ABNORMAL HIGH (ref 65–99)
Glucose, Bld: 126 mg/dL — ABNORMAL HIGH (ref 65–99)
Glucose, Bld: 154 mg/dL — ABNORMAL HIGH (ref 65–99)
HCT: 42 % (ref 39.0–52.0)
HCT: 34 % — ABNORMAL LOW (ref 39.0–52.0)
HCT: 40 % (ref 39.0–52.0)
HCT: 33 % — ABNORMAL LOW (ref 39.0–52.0)
HCT: 28 % — ABNORMAL LOW (ref 39.0–52.0)
HCT: 31 % — ABNORMAL LOW (ref 39.0–52.0)
HCT: 33 % — ABNORMAL LOW (ref 39.0–52.0)
Hemoglobin: 14.3 g/dL (ref 13.0–17.0)
Hemoglobin: 11.6 g/dL — ABNORMAL LOW (ref 13.0–17.0)
Hemoglobin: 13.6 g/dL (ref 13.0–17.0)
Hemoglobin: 11.2 g/dL — ABNORMAL LOW (ref 13.0–17.0)
Hemoglobin: 9.5 g/dL — ABNORMAL LOW (ref 13.0–17.0)
Hemoglobin: 10.5 g/dL — ABNORMAL LOW (ref 13.0–17.0)
Hemoglobin: 11.2 g/dL — ABNORMAL LOW (ref 13.0–17.0)
Potassium: 4.2 mmol/L (ref 3.5–5.1)
Potassium: 4.3 mmol/L (ref 3.5–5.1)
Potassium: 4.3 mmol/L (ref 3.5–5.1)
Potassium: 4.8 mmol/L (ref 3.5–5.1)
Potassium: 5.1 mmol/L (ref 3.5–5.1)
Potassium: 4.8 mmol/L (ref 3.5–5.1)
Potassium: 5.1 mmol/L (ref 3.5–5.1)
Sodium: 141 mmol/L (ref 135–145)
Sodium: 139 mmol/L (ref 135–145)
Sodium: 140 mmol/L (ref 135–145)
Sodium: 138 mmol/L (ref 135–145)
Sodium: 138 mmol/L (ref 135–145)
Sodium: 140 mmol/L (ref 135–145)
Sodium: 136 mmol/L (ref 135–145)
TCO2: 29 mmol/L (ref 0–100)
TCO2: 24 mmol/L (ref 0–100)
TCO2: 26 mmol/L (ref 0–100)
TCO2: 28 mmol/L (ref 0–100)
TCO2: 26 mmol/L (ref 0–100)
TCO2: 25 mmol/L (ref 0–100)
TCO2: 24 mmol/L (ref 0–100)

## 2016-03-19 LAB — POCT I-STAT 3, ART BLOOD GAS (G3+)
Acid-Base Excess: 1 mmol/L (ref 0.0–2.0)
Acid-base deficit: 3 mmol/L — ABNORMAL HIGH (ref 0.0–2.0)
Acid-base deficit: 3 mmol/L — ABNORMAL HIGH (ref 0.0–2.0)
Acid-base deficit: 4 mmol/L — ABNORMAL HIGH (ref 0.0–2.0)
Bicarbonate: 27.4 mmol/L (ref 20.0–28.0)
Bicarbonate: 25.3 mmol/L (ref 20.0–28.0)
Bicarbonate: 22.7 mmol/L (ref 20.0–28.0)
Bicarbonate: 22.4 mmol/L (ref 20.0–28.0)
Bicarbonate: 19.3 mmol/L — ABNORMAL LOW (ref 20.0–28.0)
O2 Saturation: 100 %
O2 Saturation: 100 %
O2 Saturation: 100 %
O2 Saturation: 96 %
O2 Saturation: 98 %
Patient temperature: 36.3
Patient temperature: 98.2
TCO2: 29 mmol/L (ref 0–100)
TCO2: 27 mmol/L (ref 0–100)
TCO2: 24 mmol/L (ref 0–100)
TCO2: 24 mmol/L (ref 0–100)
TCO2: 20 mmol/L (ref 0–100)
pCO2 arterial: 51.6 mmHg — ABNORMAL HIGH (ref 32.0–48.0)
pCO2 arterial: 42.6 mmHg (ref 32.0–48.0)
pCO2 arterial: 42.8 mmHg (ref 32.0–48.0)
pCO2 arterial: 40.1 mmHg (ref 32.0–48.0)
pCO2 arterial: 29.9 mmHg — ABNORMAL LOW (ref 32.0–48.0)
pH, Arterial: 7.333 — ABNORMAL LOW (ref 7.350–7.450)
pH, Arterial: 7.381 (ref 7.350–7.450)
pH, Arterial: 7.332 — ABNORMAL LOW (ref 7.350–7.450)
pH, Arterial: 7.353 (ref 7.350–7.450)
pH, Arterial: 7.416 (ref 7.350–7.450)
pO2, Arterial: 424 mmHg — ABNORMAL HIGH (ref 83.0–108.0)
pO2, Arterial: 408 mmHg — ABNORMAL HIGH (ref 83.0–108.0)
pO2, Arterial: 220 mmHg — ABNORMAL HIGH (ref 83.0–108.0)
pO2, Arterial: 83 mmHg (ref 83.0–108.0)
pO2, Arterial: 108 mmHg (ref 83.0–108.0)

## 2016-03-19 LAB — POCT I-STAT 4, (NA,K, GLUC, HGB,HCT)
Glucose, Bld: 119 mg/dL — ABNORMAL HIGH (ref 65–99)
HCT: 32 % — ABNORMAL LOW (ref 39.0–52.0)
Hemoglobin: 10.9 g/dL — ABNORMAL LOW (ref 13.0–17.0)
Potassium: 4.7 mmol/L (ref 3.5–5.1)
Sodium: 140 mmol/L (ref 135–145)

## 2016-03-19 LAB — GLUCOSE, CAPILLARY
Glucose-Capillary: 105 mg/dL — ABNORMAL HIGH (ref 65–99)
Glucose-Capillary: 106 mg/dL — ABNORMAL HIGH (ref 65–99)
Glucose-Capillary: 119 mg/dL — ABNORMAL HIGH (ref 65–99)
Glucose-Capillary: 167 mg/dL — ABNORMAL HIGH (ref 65–99)

## 2016-03-19 LAB — CBC
HCT: 45 % (ref 39.0–52.0)
HCT: 34.9 % — ABNORMAL LOW (ref 39.0–52.0)
HCT: 34.1 % — ABNORMAL LOW (ref 39.0–52.0)
Hemoglobin: 15.2 g/dL (ref 13.0–17.0)
Hemoglobin: 11.9 g/dL — ABNORMAL LOW (ref 13.0–17.0)
Hemoglobin: 11.5 g/dL — ABNORMAL LOW (ref 13.0–17.0)
MCH: 31.4 pg (ref 26.0–34.0)
MCH: 31.3 pg (ref 26.0–34.0)
MCH: 31.3 pg (ref 26.0–34.0)
MCHC: 33.8 g/dL (ref 30.0–36.0)
MCHC: 34.1 g/dL (ref 30.0–36.0)
MCHC: 33.7 g/dL (ref 30.0–36.0)
MCV: 93 fL (ref 78.0–100.0)
MCV: 91.8 fL (ref 78.0–100.0)
MCV: 92.7 fL (ref 78.0–100.0)
Platelets: 149 10*3/uL — ABNORMAL LOW (ref 150–400)
Platelets: 95 10*3/uL — ABNORMAL LOW (ref 150–400)
Platelets: 163 10*3/uL (ref 150–400)
RBC: 4.84 MIL/uL (ref 4.22–5.81)
RBC: 3.8 MIL/uL — ABNORMAL LOW (ref 4.22–5.81)
RBC: 3.68 MIL/uL — ABNORMAL LOW (ref 4.22–5.81)
RDW: 13.8 % (ref 11.5–15.5)
RDW: 13.6 % (ref 11.5–15.5)
RDW: 14.1 % (ref 11.5–15.5)
WBC: 7.6 10*3/uL (ref 4.0–10.5)
WBC: 13.5 10*3/uL — ABNORMAL HIGH (ref 4.0–10.5)
WBC: 12.1 10*3/uL — ABNORMAL HIGH (ref 4.0–10.5)

## 2016-03-19 LAB — BASIC METABOLIC PANEL
Anion gap: 7 (ref 5–15)
BUN: 19 mg/dL (ref 6–20)
CO2: 27 mmol/L (ref 22–32)
Calcium: 9 mg/dL (ref 8.9–10.3)
Chloride: 104 mmol/L (ref 101–111)
Creatinine, Ser: 1.44 mg/dL — ABNORMAL HIGH (ref 0.61–1.24)
GFR calc Af Amer: 50 mL/min — ABNORMAL LOW (ref >60)
GFR calc non Af Amer: 43 mL/min — ABNORMAL LOW (ref >60)
Glucose, Bld: 96 mg/dL (ref 65–99)
Potassium: 4.1 mmol/L (ref 3.5–5.1)
Sodium: 138 mmol/L (ref 135–145)

## 2016-03-19 LAB — PLATELET COUNT: Platelets: 91 10*3/uL — ABNORMAL LOW (ref 150–400)

## 2016-03-19 LAB — ECHO INTRAOPERATIVE TEE
Height: 71 in
Weight: 2920 oz

## 2016-03-19 LAB — PROTIME-INR
INR: 1.47
Prothrombin Time: 18 seconds — ABNORMAL HIGH (ref 11.4–15.2)

## 2016-03-19 LAB — HEMOGLOBIN AND HEMATOCRIT, BLOOD
HCT: 29.1 % — ABNORMAL LOW (ref 39.0–52.0)
Hemoglobin: 10.1 g/dL — ABNORMAL LOW (ref 13.0–17.0)

## 2016-03-19 LAB — CREATININE, SERUM
Creatinine, Ser: 1.22 mg/dL (ref 0.61–1.24)
GFR calc Af Amer: >60 mL/min (ref >60)
GFR calc non Af Amer: 53 mL/min — ABNORMAL LOW (ref >60)

## 2016-03-19 LAB — APTT: aPTT: 38 seconds — ABNORMAL HIGH (ref 24–36)

## 2016-03-19 LAB — MAGNESIUM: Magnesium: 3.1 mg/dL — ABNORMAL HIGH (ref 1.7–2.4)

## 2016-03-19 SURGERY — REPAIR, MITRAL VALVE, MINIMALLY INVASIVE
Anesthesia: General | Site: Chest | Laterality: Right

## 2016-03-19 MED ORDER — ROCURONIUM BROMIDE 10 MG/ML (PF) SYRINGE
PREFILLED_SYRINGE | INTRAVENOUS | Status: AC
  Filled 2016-03-19: qty 20

## 2016-03-19 MED ORDER — BISACODYL 5 MG PO TBEC
10.0000 mg | DELAYED_RELEASE_TABLET | Freq: Every day | ORAL | Status: DC
  Administered 2016-03-20 – 2016-03-25 (×5): 10 mg via ORAL
  Filled 2016-03-19 (×6): qty 2

## 2016-03-19 MED ORDER — ARTIFICIAL TEARS OP OINT
TOPICAL_OINTMENT | OPHTHALMIC | Status: DC | PRN
  Administered 2016-03-19: 1 via OPHTHALMIC

## 2016-03-19 MED ORDER — ACETAMINOPHEN 650 MG RE SUPP
650.0000 mg | Freq: Once | RECTAL | Status: AC
  Administered 2016-03-19: 650 mg via RECTAL

## 2016-03-19 MED ORDER — VANCOMYCIN HCL 1000 MG IV SOLR
INTRAVENOUS | Status: DC
  Filled 2016-03-19 (×2): qty 1000

## 2016-03-19 MED ORDER — ASPIRIN EC 325 MG PO TBEC
325.0000 mg | DELAYED_RELEASE_TABLET | Freq: Every day | ORAL | Status: DC
  Administered 2016-03-20: 325 mg via ORAL
  Filled 2016-03-19: qty 1

## 2016-03-19 MED ORDER — PANTOPRAZOLE SODIUM 40 MG PO TBEC
40.0000 mg | DELAYED_RELEASE_TABLET | Freq: Every day | ORAL | Status: DC
  Administered 2016-03-21 – 2016-03-29 (×9): 40 mg via ORAL
  Filled 2016-03-19 (×9): qty 1

## 2016-03-19 MED ORDER — LIDOCAINE HCL 4 % MT SOLN
OROMUCOSAL | Status: DC | PRN
  Administered 2016-03-19: 4 mL via TOPICAL

## 2016-03-19 MED ORDER — ALBUMIN HUMAN 5 % IV SOLN
INTRAVENOUS | Status: DC | PRN
  Administered 2016-03-19: 13:00:00 via INTRAVENOUS

## 2016-03-19 MED ORDER — FENTANYL CITRATE (PF) 250 MCG/5ML IJ SOLN
INTRAMUSCULAR | Status: AC
  Filled 2016-03-19: qty 25

## 2016-03-19 MED ORDER — PROPOFOL 10 MG/ML IV BOLUS
INTRAVENOUS | Status: AC
Start: 2016-03-19 — End: 2016-03-19
  Filled 2016-03-19: qty 20

## 2016-03-19 MED ORDER — BISACODYL 10 MG RE SUPP
10.0000 mg | Freq: Every day | RECTAL | Status: DC
  Administered 2016-03-24: 10 mg via RECTAL
  Filled 2016-03-19: qty 1

## 2016-03-19 MED ORDER — DEXTROSE 5 % IV SOLN
1.5000 g | Freq: Two times a day (BID) | INTRAVENOUS | Status: AC
  Administered 2016-03-19 – 2016-03-21 (×4): 1.5 g via INTRAVENOUS
  Filled 2016-03-19 (×5): qty 1.5

## 2016-03-19 MED ORDER — LACTATED RINGERS IV SOLN
500.0000 mL | Freq: Once | INTRAVENOUS | Status: AC | PRN
  Administered 2016-03-19: 500 mL via INTRAVENOUS

## 2016-03-19 MED ORDER — PHENYLEPHRINE HCL 10 MG/ML IJ SOLN
0.0000 ug/min | INTRAVENOUS | Status: DC
  Filled 2016-03-19 (×2): qty 2

## 2016-03-19 MED ORDER — FENTANYL CITRATE (PF) 250 MCG/5ML IJ SOLN
INTRAMUSCULAR | Status: AC
  Filled 2016-03-19: qty 5

## 2016-03-19 MED ORDER — MIDAZOLAM HCL 2 MG/2ML IJ SOLN
INTRAMUSCULAR | Status: AC
  Filled 2016-03-19: qty 2

## 2016-03-19 MED ORDER — NITROGLYCERIN IN D5W 200-5 MCG/ML-% IV SOLN: 0.0000 ug/min | INTRAVENOUS | Status: DC

## 2016-03-19 MED ORDER — BUPIVACAINE 0.5 % ON-Q PUMP SINGLE CATH 400 ML
400.0000 mL | INJECTION | Status: AC
  Administered 2016-03-19: 400 mL
  Filled 2016-03-19: qty 400

## 2016-03-19 MED ORDER — TRAMADOL HCL 50 MG PO TABS
50.0000 mg | ORAL_TABLET | ORAL | Status: DC | PRN
  Administered 2016-03-20: 50 mg via ORAL
  Administered 2016-03-20: 100 mg via ORAL
  Administered 2016-03-20 – 2016-03-21 (×2): 50 mg via ORAL
  Filled 2016-03-19 (×2): qty 1
  Filled 2016-03-19: qty 2
  Filled 2016-03-19: qty 1

## 2016-03-19 MED ORDER — HEPARIN SODIUM (PORCINE) 1000 UNIT/ML IJ SOLN
INTRAMUSCULAR | Status: DC | PRN
  Administered 2016-03-19: 30000 [IU] via INTRAVENOUS

## 2016-03-19 MED ORDER — SODIUM CHLORIDE 0.9 % IV SOLN
INTRAVENOUS | Status: DC
  Filled 2016-03-19: qty 2.5

## 2016-03-19 MED ORDER — LACTATED RINGERS IV SOLN
INTRAVENOUS | Status: DC
  Administered 2016-03-19: 15:00:00 via INTRAVENOUS

## 2016-03-19 MED ORDER — FENTANYL CITRATE (PF) 250 MCG/5ML IJ SOLN
INTRAMUSCULAR | Status: DC | PRN
  Administered 2016-03-19 (×2): 100 ug via INTRAVENOUS
  Administered 2016-03-19: 700 ug via INTRAVENOUS
  Administered 2016-03-19: 150 ug via INTRAVENOUS
  Administered 2016-03-19: 50 ug via INTRAVENOUS
  Administered 2016-03-19: 150 ug via INTRAVENOUS
  Administered 2016-03-19: 250 ug via INTRAVENOUS

## 2016-03-19 MED ORDER — ARTIFICIAL TEARS OP OINT
TOPICAL_OINTMENT | OPHTHALMIC | Status: AC
  Filled 2016-03-19: qty 3.5

## 2016-03-19 MED ORDER — ONDANSETRON HCL 4 MG/2ML IJ SOLN
4.0000 mg | Freq: Four times a day (QID) | INTRAMUSCULAR | Status: DC | PRN
  Administered 2016-03-21 – 2016-03-22 (×3): 4 mg via INTRAVENOUS
  Filled 2016-03-19 (×3): qty 2

## 2016-03-19 MED ORDER — MAGNESIUM SULFATE 4 GM/100ML IV SOLN
4.0000 g | Freq: Once | INTRAVENOUS | Status: AC
  Administered 2016-03-19: 4 g via INTRAVENOUS
  Filled 2016-03-19: qty 100

## 2016-03-19 MED ORDER — EPHEDRINE SULFATE 50 MG/ML IJ SOLN
INTRAMUSCULAR | Status: DC | PRN
  Administered 2016-03-19: 10 mg via INTRAVENOUS

## 2016-03-19 MED ORDER — MORPHINE SULFATE (PF) 2 MG/ML IV SOLN
1.0000 mg | INTRAVENOUS | Status: DC | PRN
  Administered 2016-03-20 (×3): 2 mg via INTRAVENOUS
  Filled 2016-03-19 (×5): qty 1

## 2016-03-19 MED ORDER — ROCURONIUM BROMIDE 10 MG/ML (PF) SYRINGE
PREFILLED_SYRINGE | INTRAVENOUS | Status: AC
  Filled 2016-03-19: qty 10

## 2016-03-19 MED ORDER — OXYCODONE HCL 5 MG PO TABS
5.0000 mg | ORAL_TABLET | ORAL | Status: DC | PRN
Start: 2016-03-19 — End: 2016-03-20
  Administered 2016-03-19: 5 mg via ORAL
  Filled 2016-03-19: qty 1

## 2016-03-19 MED ORDER — ASPIRIN 81 MG PO CHEW: 324.0000 mg | CHEWABLE_TABLET | Freq: Every day | ORAL | Status: DC

## 2016-03-19 MED ORDER — CHLORHEXIDINE GLUCONATE 0.12 % MT SOLN
15.0000 mL | OROMUCOSAL | Status: AC
  Administered 2016-03-19: 15 mL via OROMUCOSAL

## 2016-03-19 MED ORDER — PHENYLEPHRINE HCL 10 MG/ML IJ SOLN
INTRAMUSCULAR | Status: DC | PRN
  Administered 2016-03-19 (×2): 80 ug via INTRAVENOUS

## 2016-03-19 MED ORDER — BUPIVACAINE HCL (PF) 0.5 % IJ SOLN
INTRAMUSCULAR | Status: DC | PRN
  Administered 2016-03-19: 10 mL

## 2016-03-19 MED ORDER — PROTAMINE SULFATE 10 MG/ML IV SOLN: INTRAVENOUS | Status: DC | PRN

## 2016-03-19 MED ORDER — INSULIN ASPART 100 UNIT/ML ~~LOC~~ SOLN
0.0000 [IU] | SUBCUTANEOUS | Status: DC
  Administered 2016-03-20: 2 [IU] via SUBCUTANEOUS
  Administered 2016-03-20: 4 [IU] via SUBCUTANEOUS
  Administered 2016-03-20 (×2): 2 [IU] via SUBCUTANEOUS
  Administered 2016-03-20: 4 [IU] via SUBCUTANEOUS
  Administered 2016-03-21: 2 [IU] via SUBCUTANEOUS

## 2016-03-19 MED ORDER — 0.9 % SODIUM CHLORIDE (POUR BTL) OPTIME
TOPICAL | Status: DC | PRN
Start: 2016-03-19 — End: 2016-03-19
  Administered 2016-03-19: 5000 mL

## 2016-03-19 MED ORDER — MIDAZOLAM HCL 2 MG/2ML IJ SOLN: 2.0000 mg | INTRAMUSCULAR | Status: DC | PRN

## 2016-03-19 MED ORDER — LACTATED RINGERS IV SOLN
INTRAVENOUS | Status: DC | PRN
  Administered 2016-03-19: 07:00:00 via INTRAVENOUS

## 2016-03-19 MED ORDER — PHENYLEPHRINE 40 MCG/ML (10ML) SYRINGE FOR IV PUSH (FOR BLOOD PRESSURE SUPPORT)
PREFILLED_SYRINGE | INTRAVENOUS | Status: AC
  Filled 2016-03-19: qty 10

## 2016-03-19 MED ORDER — DOCUSATE SODIUM 100 MG PO CAPS
200.0000 mg | ORAL_CAPSULE | Freq: Every day | ORAL | Status: DC
  Administered 2016-03-20 – 2016-03-23 (×4): 200 mg via ORAL
  Filled 2016-03-19 (×4): qty 2

## 2016-03-19 MED ORDER — MORPHINE SULFATE (PF) 2 MG/ML IV SOLN
1.0000 mg | INTRAVENOUS | Status: DC | PRN
  Administered 2016-03-19 – 2016-03-20 (×2): 4 mg via INTRAVENOUS
  Filled 2016-03-19 (×2): qty 2

## 2016-03-19 MED ORDER — ALBUMIN HUMAN 5 % IV SOLN
250.0000 mL | INTRAVENOUS | Status: DC | PRN
  Administered 2016-03-19 (×2): 250 mL via INTRAVENOUS

## 2016-03-19 MED ORDER — POTASSIUM CHLORIDE 10 MEQ/50ML IV SOLN: 10.0000 meq | INTRAVENOUS | Status: AC

## 2016-03-19 MED ORDER — DEXMEDETOMIDINE HCL IN NACL 200 MCG/50ML IV SOLN
0.0000 ug/kg/h | INTRAVENOUS | Status: DC
  Filled 2016-03-19 (×2): qty 50

## 2016-03-19 MED ORDER — FAMOTIDINE IN NACL 20-0.9 MG/50ML-% IV SOLN
20.0000 mg | Freq: Two times a day (BID) | INTRAVENOUS | Status: DC
  Administered 2016-03-19: 20 mg via INTRAVENOUS

## 2016-03-19 MED ORDER — VANCOMYCIN HCL IN DEXTROSE 1-5 GM/200ML-% IV SOLN
1000.0000 mg | Freq: Once | INTRAVENOUS | Status: AC
  Administered 2016-03-19: 1000 mg via INTRAVENOUS
  Filled 2016-03-19: qty 200

## 2016-03-19 MED ORDER — SODIUM CHLORIDE 0.9 % IV SOLN
INTRAVENOUS | Status: DC
  Administered 2016-03-19: 15:00:00 via INTRAVENOUS

## 2016-03-19 MED ORDER — SODIUM CHLORIDE 0.9% FLUSH
3.0000 mL | INTRAVENOUS | Status: DC | PRN
  Administered 2016-03-20: 3 mL via INTRAVENOUS
  Filled 2016-03-19: qty 3

## 2016-03-19 MED ORDER — PROTAMINE SULFATE 10 MG/ML IV SOLN
INTRAVENOUS | Status: AC
  Filled 2016-03-19: qty 25

## 2016-03-19 MED ORDER — SODIUM CHLORIDE 0.9 % IV SOLN: 250.0000 mL | INTRAVENOUS | Status: DC

## 2016-03-19 MED ORDER — MIDAZOLAM HCL 5 MG/5ML IJ SOLN
INTRAMUSCULAR | Status: DC | PRN
  Administered 2016-03-19: 1 mg via INTRAVENOUS
  Administered 2016-03-19 (×3): 2 mg via INTRAVENOUS
  Administered 2016-03-19: 1 mg via INTRAVENOUS

## 2016-03-19 MED ORDER — EPHEDRINE 5 MG/ML INJ
INTRAVENOUS | Status: AC
  Filled 2016-03-19: qty 20

## 2016-03-19 MED ORDER — ORAL CARE MOUTH RINSE
15.0000 mL | Freq: Four times a day (QID) | OROMUCOSAL | Status: DC
  Administered 2016-03-20 (×2): 15 mL via OROMUCOSAL

## 2016-03-19 MED ORDER — ACETAMINOPHEN 160 MG/5ML PO SOLN: 1000.0000 mg | Freq: Four times a day (QID) | ORAL | Status: DC

## 2016-03-19 MED ORDER — PROTAMINE SULFATE 10 MG/ML IV SOLN
INTRAVENOUS | Status: AC
  Filled 2016-03-19: qty 5

## 2016-03-19 MED ORDER — CHLORHEXIDINE GLUCONATE 0.12% ORAL RINSE (MEDLINE KIT)
15.0000 mL | Freq: Two times a day (BID) | OROMUCOSAL | Status: DC
  Administered 2016-03-19 – 2016-03-20 (×2): 15 mL via OROMUCOSAL

## 2016-03-19 MED ORDER — HEPARIN SODIUM (PORCINE) 1000 UNIT/ML IJ SOLN
INTRAMUSCULAR | Status: AC
  Filled 2016-03-19: qty 1

## 2016-03-19 MED ORDER — ACETAMINOPHEN 160 MG/5ML PO SOLN: 650.0000 mg | Freq: Once | ORAL | Status: AC

## 2016-03-19 MED ORDER — PROTAMINE SULFATE 10 MG/ML IV SOLN
INTRAVENOUS | Status: DC | PRN
  Administered 2016-03-19: 300 mg via INTRAVENOUS

## 2016-03-19 MED ORDER — METOPROLOL TARTRATE 5 MG/5ML IV SOLN
2.5000 mg | INTRAVENOUS | Status: DC | PRN
  Administered 2016-03-22 (×2): 5 mg via INTRAVENOUS
  Filled 2016-03-19: qty 5

## 2016-03-19 MED ORDER — MIDAZOLAM HCL 2 MG/2ML IJ SOLN
INTRAMUSCULAR | Status: AC
  Filled 2016-03-19: qty 4

## 2016-03-19 MED ORDER — ACETAMINOPHEN 500 MG PO TABS
1000.0000 mg | ORAL_TABLET | Freq: Four times a day (QID) | ORAL | Status: AC
  Administered 2016-03-20 – 2016-03-24 (×12): 1000 mg via ORAL
  Filled 2016-03-19 (×13): qty 2

## 2016-03-19 MED ORDER — LACTATED RINGERS IV SOLN
INTRAVENOUS | Status: DC | PRN
  Administered 2016-03-19: 08:00:00 via INTRAVENOUS

## 2016-03-19 MED ORDER — SODIUM CHLORIDE 0.9% FLUSH
3.0000 mL | Freq: Two times a day (BID) | INTRAVENOUS | Status: DC
  Administered 2016-03-20 – 2016-03-25 (×7): 3 mL via INTRAVENOUS
  Administered 2016-03-26: 10 mL via INTRAVENOUS
  Administered 2016-03-28: 3 mL via INTRAVENOUS

## 2016-03-19 MED ORDER — DEXTROSE 5 % IV SOLN
INTRAVENOUS | Status: DC | PRN
  Administered 2016-03-19: 08:00:00 via INTRAVENOUS

## 2016-03-19 MED ORDER — ROCURONIUM BROMIDE 100 MG/10ML IV SOLN
INTRAVENOUS | Status: DC | PRN
  Administered 2016-03-19: 50 mg via INTRAVENOUS
  Administered 2016-03-19: 100 mg via INTRAVENOUS
  Administered 2016-03-19 (×2): 50 mg via INTRAVENOUS

## 2016-03-19 MED ORDER — SODIUM CHLORIDE 0.45 % IV SOLN
INTRAVENOUS | Status: DC | PRN
  Administered 2016-03-19: 15:00:00 via INTRAVENOUS

## 2016-03-19 MED ORDER — VANCOMYCIN HCL 1000 MG IV SOLR
INTRAVENOUS | Status: DC | PRN
  Administered 2016-03-19: 1000 mL

## 2016-03-19 MED ORDER — INSULIN REGULAR BOLUS VIA INFUSION
0.0000 [IU] | Freq: Three times a day (TID) | INTRAVENOUS | Status: DC
  Filled 2016-03-19: qty 10

## 2016-03-19 MED ORDER — BUPIVACAINE HCL (PF) 0.5 % IJ SOLN
INTRAMUSCULAR | Status: AC
  Filled 2016-03-19: qty 10

## 2016-03-19 MED ORDER — PROPOFOL 10 MG/ML IV BOLUS
INTRAVENOUS | Status: DC | PRN
  Administered 2016-03-19: 50 mg via INTRAVENOUS

## 2016-03-19 MED FILL — Sodium Chloride IV Soln 0.9%: INTRAVENOUS | Qty: 2000 | Status: AC

## 2016-03-19 MED FILL — Electrolyte-R (PH 7.4) Solution: INTRAVENOUS | Qty: 4000 | Status: AC

## 2016-03-19 MED FILL — Sodium Bicarbonate IV Soln 8.4%: INTRAVENOUS | Qty: 50 | Status: AC

## 2016-03-19 MED FILL — Heparin Sodium (Porcine) Inj 1000 Unit/ML: INTRAMUSCULAR | Qty: 10 | Status: AC

## 2016-03-19 MED FILL — Lidocaine HCl IV Inj 20 MG/ML: INTRAVENOUS | Qty: 5 | Status: AC

## 2016-03-19 SURGICAL SUPPLY — 104 items
ADAPTER CARDIO PERF ANTE/RETRO (ADAPTER) ×3 IMPLANT
ARTICLIP LAA PROCLIP II 45 (Clip) ×3 IMPLANT
BAG DECANTER FOR FLEXI CONT (MISCELLANEOUS) ×6 IMPLANT
BLADE SURG 11 STRL SS (BLADE) ×3 IMPLANT
CANISTER SUCTION 2500CC (MISCELLANEOUS) ×6 IMPLANT
CANNULA FEM VENOUS REMOTE 22FR (CANNULA) ×3 IMPLANT
CANNULA FEMORAL ART 14 SM (MISCELLANEOUS) ×3 IMPLANT
CANNULA GUNDRY RCSP 15FR (MISCELLANEOUS) ×3 IMPLANT
CANNULA OPTISITE PERFUSION 16F (CANNULA) IMPLANT
CANNULA OPTISITE PERFUSION 18F (CANNULA) ×3 IMPLANT
CANNULA SUMP PERICARDIAL (CANNULA) ×6 IMPLANT
CATH KIT ON Q 5IN SLV (PAIN MANAGEMENT) IMPLANT
CATH KIT ON-Q SILVERSOAK 5IN (CATHETERS) ×3 IMPLANT
CONN ST 1/4X3/8  BEN (MISCELLANEOUS) ×2
CONN ST 1/4X3/8 BEN (MISCELLANEOUS) ×4 IMPLANT
CONNECTOR 1/2X3/8X1/2 3 WAY (MISCELLANEOUS) ×1
CONNECTOR 1/2X3/8X1/2 3WAY (MISCELLANEOUS) ×2 IMPLANT
CONT SPEC STER OR (MISCELLANEOUS) ×3 IMPLANT
CONT SPECI 4OZ STER CLIK (MISCELLANEOUS) ×3 IMPLANT
COVER BACK TABLE 24X17X13 BIG (DRAPES) ×3 IMPLANT
CRADLE DONUT ADULT HEAD (MISCELLANEOUS) ×3 IMPLANT
DERMABOND ADVANCED (GAUZE/BANDAGES/DRESSINGS) ×1
DERMABOND ADVANCED .7 DNX12 (GAUZE/BANDAGES/DRESSINGS) ×2 IMPLANT
DEVICE ATRICLIP LAA PRCLPII 45 (Clip) ×2 IMPLANT
DEVICE PMI PUNCTURE CLOSURE (MISCELLANEOUS) ×3 IMPLANT
DEVICE SUT CK QUICK LOAD MINI (Prosthesis & Implant Heart) ×6 IMPLANT
DEVICE TROCAR PUNCTURE CLOSURE (ENDOMECHANICALS) ×3 IMPLANT
DRAIN CHANNEL 28F RND 3/8 FF (WOUND CARE) ×6 IMPLANT
DRAPE BILATERAL SPLIT (DRAPES) ×3 IMPLANT
DRAPE C-ARM 42X72 X-RAY (DRAPES) ×3 IMPLANT
DRAPE CV SPLIT W-CLR ANES SCRN (DRAPES) ×3 IMPLANT
DRAPE INCISE IOBAN 66X45 STRL (DRAPES) ×9 IMPLANT
DRAPE SLUSH/WARMER DISC (DRAPES) ×3 IMPLANT
DRSG COVADERM 4X8 (GAUZE/BANDAGES/DRESSINGS) ×3 IMPLANT
ELECT BLADE 6.5 EXT (BLADE) ×3 IMPLANT
ELECT REM PT RETURN 9FT ADLT (ELECTROSURGICAL) ×6
ELECTRODE REM PT RTRN 9FT ADLT (ELECTROSURGICAL) ×4 IMPLANT
FELT TEFLON 1X6 (MISCELLANEOUS) ×6 IMPLANT
FEMORAL VENOUS CANN RAP (CANNULA) IMPLANT
GLOVE BIO SURGEON STRL SZ 6 (GLOVE) ×3 IMPLANT
GLOVE BIO SURGEON STRL SZ7 (GLOVE) ×18 IMPLANT
GLOVE BIOGEL PI IND STRL 6 (GLOVE) ×4 IMPLANT
GLOVE BIOGEL PI INDICATOR 6 (GLOVE) ×2
GLOVE ORTHO TXT STRL SZ7.5 (GLOVE) ×9 IMPLANT
GOWN STRL REUS W/ TWL LRG LVL3 (GOWN DISPOSABLE) ×8 IMPLANT
GOWN STRL REUS W/TWL LRG LVL3 (GOWN DISPOSABLE) ×4
IV NS IRRIG 3000ML ARTHROMATIC (IV SOLUTION) ×3 IMPLANT
KIT BASIN OR (CUSTOM PROCEDURE TRAY) ×3 IMPLANT
KIT DILATOR VASC 18G NDL (KITS) ×3 IMPLANT
KIT DRAINAGE VACCUM ASSIST (KITS) ×3 IMPLANT
KIT ROOM TURNOVER OR (KITS) ×3 IMPLANT
KIT SUCTION CATH 14FR (SUCTIONS) ×3 IMPLANT
KIT SUT CK MINI COMBO 4X17 (Prosthesis & Implant Heart) ×3 IMPLANT
LEAD PACING MYOCARDI (MISCELLANEOUS) ×3 IMPLANT
LINE VENT (MISCELLANEOUS) ×3 IMPLANT
NEEDLE AORTIC ROOT 14G 7F (CATHETERS) ×3 IMPLANT
NS IRRIG 1000ML POUR BTL (IV SOLUTION) ×15 IMPLANT
PACK OPEN HEART (CUSTOM PROCEDURE TRAY) ×3 IMPLANT
PAD ARMBOARD 7.5X6 YLW CONV (MISCELLANEOUS) ×6 IMPLANT
PAD ELECT DEFIB RADIOL ZOLL (MISCELLANEOUS) ×3 IMPLANT
PAIN PUMP ON-Q 400MLX5ML 5IN (MISCELLANEOUS) ×3 IMPLANT
PATCH CORMATRIX 4CMX7CM (Prosthesis & Implant Heart) ×3 IMPLANT
RETRACTOR TRL SOFT TISSUE LG (INSTRUMENTS) IMPLANT
RETRACTOR TRM SOFT TISSUE 7.5 (INSTRUMENTS) IMPLANT
RING MITRAL MEMO 3D 34MM SMD34 (Prosthesis & Implant Heart) ×3 IMPLANT
SET CANNULATION TOURNIQUET (MISCELLANEOUS) ×3 IMPLANT
SET CARDIOPLEGIA MPS 5001102 (MISCELLANEOUS) ×3 IMPLANT
SET IRRIG TUBING LAPAROSCOPIC (IRRIGATION / IRRIGATOR) ×3 IMPLANT
SOLUTION ANTI FOG 6CC (MISCELLANEOUS) ×3 IMPLANT
SPONGE GAUZE 4X4 12PLY STER LF (GAUZE/BANDAGES/DRESSINGS) ×3 IMPLANT
SUT BONE WAX W31G (SUTURE) ×3 IMPLANT
SUT E-PACK MINIMALLY INVASIVE (SUTURE) ×3 IMPLANT
SUT ETHIBOND (SUTURE) ×6 IMPLANT
SUT ETHIBOND 2 0 SH (SUTURE) ×6 IMPLANT
SUT ETHIBOND 2-0 RB-1 WHT (SUTURE) ×6 IMPLANT
SUT ETHIBOND X763 2 0 SH 1 (SUTURE) ×3 IMPLANT
SUT GORETEX CV 4 TH 22 36 (SUTURE) ×3 IMPLANT
SUT GORETEX CV-5THC-13 36IN (SUTURE) ×27 IMPLANT
SUT GORETEX CV4 TH-18 (SUTURE) ×6 IMPLANT
SUT PROLENE 3 0 SH1 36 (SUTURE) ×18 IMPLANT
SUT PROLENE 6 0 C 1 30 (SUTURE) ×15 IMPLANT
SUT PTFE CHORD X 24MM (SUTURE) ×3 IMPLANT
SUT SILK  1 MH (SUTURE) ×2
SUT SILK 1 MH (SUTURE) ×4 IMPLANT
SUT SILK 2 0 SH CR/8 (SUTURE) IMPLANT
SUT SILK 3 0 SH CR/8 (SUTURE) IMPLANT
SUT VIC AB 2-0 CTX 36 (SUTURE) IMPLANT
SUT VIC AB 3-0 SH 8-18 (SUTURE) ×3 IMPLANT
SUT VICRYL 2 TP 1 (SUTURE) IMPLANT
SYRINGE 10CC LL (SYRINGE) ×3 IMPLANT
SYSTEM SAHARA CHEST DRAIN ATS (WOUND CARE) ×3 IMPLANT
TAPE CLOTH SURG 4X10 WHT LF (GAUZE/BANDAGES/DRESSINGS) ×3 IMPLANT
TAPE PAPER 2X10 WHT MICROPORE (GAUZE/BANDAGES/DRESSINGS) ×3 IMPLANT
TOWEL OR 17X24 6PK STRL BLUE (TOWEL DISPOSABLE) ×6 IMPLANT
TOWEL OR 17X26 10 PK STRL BLUE (TOWEL DISPOSABLE) ×6 IMPLANT
TRAY FOLEY IC TEMP SENS 16FR (CATHETERS) ×3 IMPLANT
TROCAR XCEL BLADELESS 5X75MML (TROCAR) ×3 IMPLANT
TROCAR XCEL NON-BLD 11X100MML (ENDOMECHANICALS) ×6 IMPLANT
TUBE SUCT INTRACARD DLP 20F (MISCELLANEOUS) ×3 IMPLANT
TUNNELER SHEATH ON-Q 11GX8 DSP (PAIN MANAGEMENT) ×3 IMPLANT
UNDERPAD 30X30 (UNDERPADS AND DIAPERS) ×3 IMPLANT
WATER STERILE IRR 1000ML POUR (IV SOLUTION) ×6 IMPLANT
WIRE .035 3MM-J 145CM (WIRE) ×3 IMPLANT
WIRE J 3MM .035X145CM (WIRE) ×3 IMPLANT

## 2016-03-19 NOTE — Anesthesia Procedure Notes (Deleted)
Central Venous Catheter Insertion Performed by: anesthesiologist 03/19/2016 7:29 AM Patient location: Pre-op. Preanesthetic checklist: patient identified, IV checked, site marked, risks and benefits discussed, surgical consent, monitors and equipment checked, pre-op evaluation, timeout performed and anesthesia consent Position: Trendelenburg Lidocaine 1% used for infiltration Landmarks identified and Seldinger technique used Catheter size: 8 Fr Central line was placed.Double lumen Procedure performed using ultrasound guided technique. Attempts: 2 (Attempted once L [place] vein. Unable to locate vein.) Following insertion, dressing applied, line sutured and Biopatch. Post procedure assessment: blood return through all ports. Patient tolerated the procedure well with no immediate complications.

## 2016-03-19 NOTE — Transfer of Care (Signed)
Immediate Anesthesia Transfer of Care Note  Patient: Roy Palmer  Procedure(s) Performed: Procedure(s): MINIMALLY INVASIVE MITRAL VALVE REPAIR (MVR) (Right) TRANSESOPHAGEAL ECHOCARDIOGRAM (TEE) (N/A)  Patient Location: SICU  Anesthesia Type:General  Level of Consciousness: sedated and Patient remains intubated per anesthesia plan  Airway & Oxygen Therapy: Patient remains intubated per anesthesia plan and Patient placed on Ventilator (see vital sign flow sheet for setting)  Post-op Assessment: Report given to RN and Post -op Vital signs reviewed and stable  Post vital signs: Reviewed and stable  Last Vitals:  Vitals:   03/18/16 1957 03/19/16 0419  BP: 136/73 (!) 157/49  Pulse: 70 69  Resp:  19  Temp: 36.7 C 36.7 C    Last Pain:  Vitals:   03/19/16 0419  TempSrc: Oral  PainSc:          Complications: No apparent anesthesia complications

## 2016-03-19 NOTE — Anesthesia Procedure Notes (Signed)
Procedure Name: Intubation Date/Time: 03/19/2016 7:53 AM Performed by: Jacquiline Doe A Pre-anesthesia Checklist: Patient identified, Emergency Drugs available, Suction available and Patient being monitored Patient Re-evaluated:Patient Re-evaluated prior to inductionOxygen Delivery Method: Circle System Utilized and Circle system utilized Preoxygenation: Pre-oxygenation with 100% oxygen Intubation Type: IV induction and Cricoid Pressure applied Ventilation: Mask ventilation without difficulty Laryngoscope Size: Mac and 4 Grade View: Grade I Tube type: Oral Endobronchial tube: Left, EBT position confirmed by auscultation, EBT position confirmed by fiberoptic bronchoscope and Double lumen EBT and 41 Fr Number of attempts: 1 Airway Equipment and Method: Stylet,  Oral airway and LTA kit utilized Placement Confirmation: ETT inserted through vocal cords under direct vision,  positive ETCO2 and breath sounds checked- equal and bilateral Secured at: 29 cm Tube secured with: Tape Dental Injury: Teeth and Oropharynx as per pre-operative assessment

## 2016-03-19 NOTE — Anesthesia Procedure Notes (Signed)
Central Venous Catheter Insertion Performed by: anesthesiologist 03/19/2016 6:45 AM Patient location: Pre-op. Preanesthetic checklist: patient identified, IV checked, site marked, risks and benefits discussed, surgical consent, monitors and equipment checked, pre-op evaluation, timeout performed and anesthesia consent Landmarks identified PA cath was placed.Swan type and PA catheter depth:thermodilationProcedure performed using ultrasound guided technique. Attempts: 1 Patient tolerated the procedure well with no immediate complications.

## 2016-03-19 NOTE — Procedures (Signed)
NIF (-30) and VC (1.2L) performed with great effort per cardiac weaning protocol.

## 2016-03-19 NOTE — OR Nursing (Signed)
13:15 - 45 minute call to SICU charge nurse 

## 2016-03-19 NOTE — Anesthesia Procedure Notes (Signed)
Central Venous Catheter Insertion Performed by: anesthesiologist 03/19/2016 6:45 AM Patient location: Pre-op. Preanesthetic checklist: patient identified, IV checked, site marked, risks and benefits discussed, surgical consent, monitors and equipment checked, pre-op evaluation, timeout performed and anesthesia consent Position: Trendelenburg Lidocaine 1% used for infiltration Landmarks identified and Seldinger technique used Catheter size: 8.5 Fr Central line was placed.Sheath introducer Swan type and PA catheter depth:thermodilution and 50PA Cath depth:50 Procedure performed using ultrasound guided technique. Attempts: 1 Following insertion, line sutured and dressing applied. Post procedure assessment: blood return through all ports, free fluid flow and no air. Patient tolerated the procedure well with no immediate complications.

## 2016-03-19 NOTE — Op Note (Signed)
CARDIOTHORACIC SURGERY OPERATIVE NOTE  Date of Procedure:  03/19/2016  Preoperative Diagnosis: Severe Mitral Regurgitation  Postoperative Diagnosis: Same  Procedure:    Minimally-Invasive Mitral Valve Repair  Complex valvuloplasty including triangular resection of flail segment of posterior leaflet  Chordal transposition x1  Artificial Gore-tex neochord placement x4  Sorin Memo 3D Ring Annuloplasty (size 34mm, catalog # [id number], serial # [id number])  Clipping of left atrial appendage   Surgeon: Salvatore Decent. Cornelius Moras, MD  Assistant: Ardelle Balls, PA-C  Anesthesia: Zenon Mayo, MD  Operative Findings:  Fibroelastic type myxomatous degenerative disease   Multiple ruptured chordae tendineae with large flail segment of posterior leaflet  Type II dysfunction with severe mitral regurgitation  Mild LV systolic dysfunction  No residual mitral regurgitation after successful valve repair                   BRIEF CLINICAL NOTE AND INDICATIONS FOR SURGERY  Patient is an 82 year old male with history of heart murmur, hypertension, hyperlipidemia, stage III chronic kidney disease, and trigeminal neuralgia who has recently developed episodes of recurrent nonsustained ventricular tachycardia associated with physical exertion and has been referred for surgical consultation to discuss treatment options for management of severe symptomatic mitral regurgitation.  The patient states that he has known that he had a heart murmur for quite some time. He had never previously been evaluated by a cardiologist until recently. Approximately 6 weeks ago the patient first began to experience episodes of transient dizzy spells associated with shortness of breath and chest discomfort. The patient states that episodes are always associated with physical exertion. 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  The patient states that he  can feel his heart racing, he develops discomfort across his chest associated with shortness of breath, and he becomes severely dizzy. He stops and lays down and usually within 5-10 minutes symptoms resolve completely. Over the past 6 weeks he has had numerous episodes which have been increasing in frequency. They are always brought on with physical exertion. The patient was referred to Dr. Okey Dupre for cardiology consultation and noted to have a prominent holosystolic murmur on physical exam.  Baseline EKGs revealed sinus rhythm with bifascicular block and PVCs. Transthoracic echocardiogram performed 02/14/2016 revealed normal left ventricular systolic function with ejection fraction estimated 50%. There was moderate to severe mitral regurgitation with prolapse of the posterior leaflet. There was severe left atrial enlargement and moderate tricuspid regurgitation. TEE was recommended and performed on 02/23/2016. TEE confirmed the presence of degenerative mitral valve disease with mitral valve prolapse including a flail segment of the posterior leaflet with severe mitral regurgitation. There was systolic flow reversal in the pulmonary veins. Left ventricular function was felt to be mildly reduced. There was trivial aortic insufficiency. There was no thrombus in the left atrial appendage. There was mild to moderate tricuspid regurgitation.  Left and right heart catheterization was performed 02/23/2016. The patient was found to have mild nonobstructive coronary artery disease with left dominant coronary circulation. Pulmonary artery pressures were only mildly elevated. 24 hour Holter monitor was performed and revealed frequent PVCs and PACs with several runs of nonsustained ventricular tachycardia. bThe patient was referred for surgical consultation.  The patient has been seen in consultation and counseled at length regarding the indications, risks and potential benefits of surgery.  All questions have been answered, and  the patient provides full informed consent for the operation as described.  After the patient's  initial outpatient consultation he was admitted to the hospital with a near syncopal event. He was evaluated by the cardiology and the electrophysiology teams.  He had a witnessed syncopal episode in the hospital immediately after performing pulmonary function tests that was associated with bradycardia. It was felt that there were no clear indication for permanent pacemaker placement. A decision was made to proceed with elective mitral valve repair while the patient remained an inpatient.     DETAILS OF THE OPERATIVE PROCEDURE  Preparation:  The patient is brought to the operating room on the above mentioned date and central monitoring was established by the anesthesia team including placement of Swan-Ganz catheter through the left internal jugular vein.  A radial arterial line is placed. The patient is placed in the supine position on the operating table.  Intravenous antibiotics are administered. General endotracheal anesthesia is induced uneventfully. The patient is initially intubated using a dual lumen endotracheal tube.  A Foley catheter is placed.  Baseline transesophageal echocardiogram was performed.  Findings were notable for myxomatous degenerative disease with a large flail segment of the posterior leaflet of the mitral valve. There were obvious ruptured chordae tendineae. There was severe mitral regurgitation. There was mild left ventricular systolic dysfunction. There was left atrial enlargement. The aortic valve appeared normal. Right ventricular size and function appeared normal. There was mild tricuspid regurgitation.  A soft roll is placed behind the patient's left scapula and the neck gently extended and turned to the left.   The patient's right neck, chest, abdomen, both groins, and both lower extremities are prepared and draped in a sterile manner. A time out procedure is  performed.  Surgical Approach:  A right miniature anterolateral thoracotomy incision is performed. The incision is placed just lateral to and superior to the right nipple. The pectoralis major muscle is retracted medially and completely preserved. The right pleural space is entered through the 3rd intercostal space. A soft tissue retractor is placed.  Two 11 mm ports are placed through separate stab incisions inferiorly. The right pleural space is insufflated continuously with carbon dioxide gas through the posterior port during the remainder of the operation.  A pledgeted sutures placed through the dome of the right hemidiaphragm and retracted inferiorly to facilitate exposure.  A longitudinal incision is made in the pericardium 3 cm anterior to the phrenic nerve and silk traction sutures are placed on either side of the incision for exposure.   Extracorporeal Cardiopulmonary Bypass:  A small incision is made in the right inguinal crease and the anterior surface of the right common femoral artery and right common femoral vein are identified.  The patient is placed in Trendelenburg position. The right internal jugular vein is cannulated with Seldinger technique and a guidewire advanced into the right atrium. The patient is heparinized systemically. The right internal jugular vein is cannulated with a 14 [place] pediatric femoral venous cannula. Pursestring sutures are placed on the anterior surface of the right common femoral vein and right common femoral artery. The right common femoral vein is cannulated with the Seldinger technique and a guidewire is advanced under transesophageal echocardiogram guidance through the right atrium. The femoral vein is cannulated with a long 22 French femoral venous cannula. The right common femoral artery is cannulated with Seldinger technique and a flexible guidewire is advanced until it can be appreciated intraluminally in the descending thoracic aorta on transesophageal  echocardiogram. The femoral artery is cannulated with an 18 French femoral arterial cannula.  Adequate heparinization is verified.  The entire pre-bypass portion of the operation was notable for stable hemodynamics.  However, the patient did develop atrial fibrillation with heart rate 90-110.  Cardiopulmonary bypass was begun.  Vacuum assist venous drainage is utilized. The incision in the pericardium is extended in both directions. Venous drainage and exposure are notably excellent.    Clipping of Left Atrial Appendage:  The left atrial appendage is obliterated using an Atricure left atrial appendage clip (Atriclip Pro245, size 45mm).  The clip is applied under thoracoscopic visualization posterior to the aorta and pulmonary artery through the oblique sinus.  The clip was applied prior to application of the aortic crossclamp, with transesophageal echocardiographic confirmation that the clip satisfactorily obliterates the appendage.   Myocardial Protection:  A retrograde cardioplegia cannula is placed through the right atrium into the coronary sinus using transesophageal echocardiogram guidance.  An antegrade cardioplegia cannula is placed in the ascending aorta.    The patient is cooled to 28C systemic temperature.  The aortic cross clamp is applied and cold blood cardioplegia is delivered initially in an antegrade fashion through the aortic root.   Supplemental cardioplegia is given retrograde through the coronary sinus catheter. The initial cardioplegic arrest is rapid with early diastolic arrest.  Repeat doses of cardioplegia are administered intermittently every 20 to 30 minutes throughout the entire cross clamp portion of the operation through the aortic root and through the coronary sinus catheter in order to maintain completely flat electrocardiogram.  Myocardial protection was felt to be excellent.   Mitral Valve Repair:  A left atriotomy incision was performed through the interatrial  groove and extended partially across the back wall of the left atrium after opening the oblique sinus inferiorly.  The mitral valve is exposed using a self-retaining retractor.  The mitral valve was inspected and notable for fibroelastic deficiency type myxomatous degenerative disease with a large flail segment involving the middle scallop (P2) of the posterior leaflet. There were multiple ruptured primary chordae tendineae. The remainder of the valve appeared fairly normal. There was some excessive tissue involving P2 but otherwise there was no excessive tissue. There was no annular calcification .  Interrupted 2-0 Ethibond horizontal mattress sutures are placed circumferentially around the entire mitral valve annulus. The sutures will ultimately be utilized for ring annuloplasty, and at this juncture there are utilized to suspend the valve symmetrically.  The flail segment of the middle scallop of the posterior leaflet was repaired using a simple triangular resection. The triangular resection was located slightly to the posterior side of midline and incorporated approximately 35% of the total surface area of P2. During resection there was a large normal length secondary chordae tendineae that was preserved and transposed to the free margin of P2. The intervening vertical defect in P2 was closed using interrupted everting simple CV 5 Gore-Tex suture.  Artificial neochord placement was performed using Chord-X multi-strand CV-4 Goretex pre-measured loops.  The appropriate cord length was measured 24 mm from corresponding normal length primary cords from the P3 segment of the posterior leaflet. The papillary muscle suture of the Chord-X multi-strand suture was placed through the head of the posterior papillary muscle in a horizontal mattress fashion and tied over Teflon felt pledgets. Two of the three pre-measured loops were then reimplanted into the free margin of the P2 segment of the posterior leaflet on the  posterior side of midline.  The third premeasured loop was discarded such that a total of 2 pairs (4 neochords) were implanted.  The valve was tested with  saline and appeared competent even without ring annuloplasty complete. The valve was sized to a 34 mm annuloplasty ring, based upon the transverse distance between the left and right commissures and the height of the anterior leaflet, corresponding to a size just slightly larger than the overall surface area of the anterior leaflet.  A Sorin Memo 3D annuloplasty ring (size 34mm, catalog [id number], serial [id number]) was secured in place uneventfully. All ring sutures were secured using a Cor-knot device.   The valve is again tested with saline and appears to be perfectly competent with a broad symmetrical line of coaptation of the anterior and posterior leaflet. There is no residual leak. There was a broad, symmetrical line of coaptation of the anterior and posterior leaflet which was confirmed using the blue ink test.  Rewarming is begun.   Procedure Completion:  The atriotomy was closed using a 2-layer closure of running 3-0 Prolene suture after placing a sump drain across the mitral valve to serve as a left ventricular vent.  One final dose of warm retrograde "hot shot" cardioplegia was administered retrograde through the coronary sinus catheter while all air was evacuated through the aortic root.  The aortic cross clamp was removed after a total cross clamp time of 115 minutes.  Epicardial pacing wires are fixed to the inferior wall of the right ventricule and to the right atrial appendage. The patient is rewarmed to 37C temperature. The left ventricular vent is removed.  The patient is ventilated and flow volumes turndown while the mitral valve repair is inspected using transesophageal echocardiogram. The valve repair appears intact with no residual leak. The antegrade cardioplegia cannula is now removed. The patient is weaned and disconnected from  cardiopulmonary bypass.  The patient's rhythm at separation from bypass was AV paced.  The patient was weaned from bypass without any inotropic support. Total cardiopulmonary bypass time for the operation was 166 minutes.  Followup transesophageal echocardiogram performed after separation from bypass revealed a well-seated annuloplasty ring in the mitral position with a normal functioning mitral valve. There was no residual leak.  Left ventricular function was unchanged from preoperatively.  The mean gradient across the mitral valve was estimated to be 2 mmHg.  The femoral arterial and venous cannulae were removed uneventfully. There was a palpable pulse in the distal right common femoral artery after removal of the cannula. Protamine was administered to reverse the anticoagulation. The right internal jugular cannula was removed and manual pressure held on the neck for 15 minutes.  Single lung ventilation was begun. The atriotomy closure was inspected for hemostasis. The pericardial sac was drained using a 28 French Bard drain placed through the anterior port incision.  The pericardium was closed using a patch of core matrix bovine submucosal tissue patch. The right pleural space is irrigated with saline solution and inspected for hemostasis. The right pleural space was drained using a 28 French Bard drain placed through the posterior port incision. The miniature thoracotomy incision was closed in multiple layers in routine fashion. The right groin incision was inspected for hemostasis and closed in multiple layers in routine fashion.  The post-bypass portion of the operation was notable for stable rhythm and hemodynamics.  No blood products were administered during the operation.   Disposition:  The patient tolerated the procedure well.  The patient was reintubated using a single lumen endotracheal tube and subsequently transported to the surgical intensive care unit in stable condition. There were no  intraoperative complications. All sponge instrument and needle counts  are verified correct at completion of the operation.     Salvatore Decentlarence H. Cornelius Moraswen MD 03/19/2016 1:57 PM

## 2016-03-19 NOTE — Anesthesia Procedure Notes (Signed)
Procedure Name: Intubation Date/Time: 03/19/2016 2:15 PM Performed by: Wray Kearns A Pre-anesthesia Checklist: Patient identified, Emergency Drugs available, Suction available, Patient being monitored and Timeout performed Oxygen Delivery Method: Circle system utilized Preoxygenation: Pre-oxygenation with 100% oxygen Tube type: Subglottic suction tube Tube size: 8.0 mm Number of attempts: 1 Airway Equipment and Method: Video-laryngoscopy Placement Confirmation: ETT inserted through vocal cords under direct vision Secured at: 23 cm Tube secured with: Tape Dental Injury: Teeth and Oropharynx as per pre-operative assessment

## 2016-03-19 NOTE — Anesthesia Postprocedure Evaluation (Signed)
Anesthesia Post Note  Patient: Roy Palmer  Procedure(s) Performed: Procedure(s) (LRB): MINIMALLY INVASIVE MITRAL VALVE REPAIR (MVR) (Right) TRANSESOPHAGEAL ECHOCARDIOGRAM (TEE) (N/A)  Patient location during evaluation: SICU Anesthesia Type: General Level of consciousness: sedated Pain management: pain level controlled Vital Signs Assessment: post-procedure vital signs reviewed and stable Respiratory status: patient remains intubated per anesthesia plan Cardiovascular status: stable Anesthetic complications: no    Last Vitals:  Vitals:   03/18/16 1957 03/19/16 0419  BP: 136/73 (!) 157/49  Pulse: 70 69  Resp:  19  Temp: 36.7 C 36.7 C    Last Pain:  Vitals:   03/19/16 0419  TempSrc: Oral  PainSc:                  Briele Lagasse,W. EDMOND

## 2016-03-19 NOTE — Brief Op Note (Addendum)
03/12/2016 - 03/19/2016  12:16 PM  PATIENT:  Roy Palmer  82 y.o. male  PRE-OPERATIVE DIAGNOSIS:  Severe MR   POST-OPERATIVE DIAGNOSIS:  Severe MR  PROCEDURE:  TRANSESOPHAGEAL ECHOCARDIOGRAM (TEE), MINIMALLY INVASIVE MITRAL VALVE REPAIR (MVR) (Right) using an annuloplasty ring, size 34, TRIANGULAR LEAFLET RESECTION  (with 4 Neo chords and one transposition),  LA CLIP (size 45), and On Q PLACEMENT  SURGEON:    [name], MD  ASSISTANTS:  Ardelle Balls, PA-C  ANESTHESIA:   Gaynelle Adu, MD  CROSSCLAMP TIME:   115'  CARDIOPULMONARY BYPASS TIME: 166'  FINDINGS:  Fibroelastic type myxomatous degenerative disease   Multiple ruptured chordae tendineae with large flail segment of posterior leaflet  Type II dysfunction with severe mitral regurgitation  Mild LV systolic dysfunction  No residual mitral regurgitation after successful valve repair  COMPLICATIONS: None  BASELINE WEIGHT: 83 kg  PATIENT DISPOSITION:   TO SICU IN STABLE CONDITION  [name], MD 03/19/2016 1:53 PM

## 2016-03-20 ENCOUNTER — Encounter (HOSPITAL_COMMUNITY): Payer: Self-pay | Admitting: Thoracic Surgery (Cardiothoracic Vascular Surgery)

## 2016-03-20 ENCOUNTER — Inpatient Hospital Stay (HOSPITAL_COMMUNITY): Payer: Medicare Other

## 2016-03-20 DIAGNOSIS — Z9889 Other specified postprocedural states: Secondary | ICD-10-CM

## 2016-03-20 LAB — CBC
HCT: 31.6 % — ABNORMAL LOW (ref 39.0–52.0)
HCT: 35.1 % — ABNORMAL LOW (ref 39.0–52.0)
Hemoglobin: 10.7 g/dL — ABNORMAL LOW (ref 13.0–17.0)
Hemoglobin: 11.5 g/dL — ABNORMAL LOW (ref 13.0–17.0)
MCH: 31.2 pg (ref 26.0–34.0)
MCH: 30.7 pg (ref 26.0–34.0)
MCHC: 33.9 g/dL (ref 30.0–36.0)
MCHC: 32.8 g/dL (ref 30.0–36.0)
MCV: 92.1 fL (ref 78.0–100.0)
MCV: 93.6 fL (ref 78.0–100.0)
Platelets: 92 10*3/uL — ABNORMAL LOW (ref 150–400)
Platelets: 83 10*3/uL — ABNORMAL LOW (ref 150–400)
RBC: 3.43 MIL/uL — ABNORMAL LOW (ref 4.22–5.81)
RBC: 3.75 MIL/uL — ABNORMAL LOW (ref 4.22–5.81)
RDW: 14 % (ref 11.5–15.5)
RDW: 14.3 % (ref 11.5–15.5)
WBC: 11.8 10*3/uL — ABNORMAL HIGH (ref 4.0–10.5)
WBC: 18.9 10*3/uL — ABNORMAL HIGH (ref 4.0–10.5)

## 2016-03-20 LAB — POCT I-STAT, CHEM 8
BUN: 17 mg/dL (ref 6–20)
Calcium, Ion: 1.14 mmol/L — ABNORMAL LOW (ref 1.15–1.40)
Chloride: 98 mmol/L — ABNORMAL LOW (ref 101–111)
Creatinine, Ser: 1.3 mg/dL — ABNORMAL HIGH (ref 0.61–1.24)
Glucose, Bld: 160 mg/dL — ABNORMAL HIGH (ref 65–99)
HCT: 35 % — ABNORMAL LOW (ref 39.0–52.0)
Hemoglobin: 11.9 g/dL — ABNORMAL LOW (ref 13.0–17.0)
Potassium: 4.4 mmol/L (ref 3.5–5.1)
Sodium: 134 mmol/L — ABNORMAL LOW (ref 135–145)
TCO2: 23 mmol/L (ref 0–100)

## 2016-03-20 LAB — CREATININE, SERUM
Creatinine, Ser: 1.37 mg/dL — ABNORMAL HIGH (ref 0.61–1.24)
GFR calc Af Amer: 53 mL/min — ABNORMAL LOW (ref >60)
GFR calc non Af Amer: 46 mL/min — ABNORMAL LOW (ref >60)

## 2016-03-20 LAB — GLUCOSE, CAPILLARY
Glucose-Capillary: 116 mg/dL — ABNORMAL HIGH (ref 65–99)
Glucose-Capillary: 124 mg/dL — ABNORMAL HIGH (ref 65–99)
Glucose-Capillary: 126 mg/dL — ABNORMAL HIGH (ref 65–99)
Glucose-Capillary: 142 mg/dL — ABNORMAL HIGH (ref 65–99)
Glucose-Capillary: 173 mg/dL — ABNORMAL HIGH (ref 65–99)
Glucose-Capillary: 127 mg/dL — ABNORMAL HIGH (ref 65–99)

## 2016-03-20 LAB — BASIC METABOLIC PANEL
Anion gap: 4 — ABNORMAL LOW (ref 5–15)
BUN: 13 mg/dL (ref 6–20)
CO2: 22 mmol/L (ref 22–32)
Calcium: 7.5 mg/dL — ABNORMAL LOW (ref 8.9–10.3)
Chloride: 108 mmol/L (ref 101–111)
Creatinine, Ser: 1.19 mg/dL (ref 0.61–1.24)
GFR calc Af Amer: >60 mL/min (ref >60)
GFR calc non Af Amer: 54 mL/min — ABNORMAL LOW (ref >60)
Glucose, Bld: 130 mg/dL — ABNORMAL HIGH (ref 65–99)
Potassium: 4.8 mmol/L (ref 3.5–5.1)
Sodium: 134 mmol/L — ABNORMAL LOW (ref 135–145)

## 2016-03-20 LAB — MAGNESIUM
Magnesium: 2.5 mg/dL — ABNORMAL HIGH (ref 1.7–2.4)
Magnesium: 2.4 mg/dL (ref 1.7–2.4)

## 2016-03-20 MED ORDER — WARFARIN - PHYSICIAN DOSING INPATIENT
Freq: Every day | Status: DC
  Administered 2016-03-20: 18:00:00
  Administered 2016-03-22: 1
  Administered 2016-03-23 – 2016-03-25 (×3)

## 2016-03-20 MED ORDER — WARFARIN SODIUM 2.5 MG PO TABS
2.5000 mg | ORAL_TABLET | Freq: Every day | ORAL | Status: DC
  Administered 2016-03-20 – 2016-03-23 (×4): 2.5 mg via ORAL
  Filled 2016-03-20 (×4): qty 1

## 2016-03-20 MED ORDER — FUROSEMIDE 10 MG/ML IJ SOLN
20.0000 mg | Freq: Once | INTRAMUSCULAR | Status: AC
  Administered 2016-03-20: 20 mg via INTRAVENOUS
  Filled 2016-03-20: qty 2

## 2016-03-20 MED FILL — Magnesium Sulfate Inj 50%: INTRAMUSCULAR | Qty: 10 | Status: AC

## 2016-03-20 MED FILL — Heparin Sodium (Porcine) Inj 1000 Unit/ML: INTRAMUSCULAR | Qty: 2500 | Status: AC

## 2016-03-20 MED FILL — Potassium Chloride Inj 2 mEq/ML: INTRAVENOUS | Qty: 40 | Status: AC

## 2016-03-20 MED FILL — Heparin Sodium (Porcine) Inj 1000 Unit/ML: INTRAMUSCULAR | Qty: 30 | Status: AC

## 2016-03-20 NOTE — Progress Notes (Addendum)
[address]       [place] [id number]             [phone]        CARDIOTHORACIC SURGERY PROGRESS NOTE   R1 Day Post-Op Procedure(s) (LRB): MINIMALLY INVASIVE MITRAL VALVE REPAIR (MVR) (Right) TRANSESOPHAGEAL ECHOCARDIOGRAM (TEE) (N/A)  Subjective: Looks good.  Mild soreness in chest.  Objective: Vital signs: BP Readings from Last 1 Encounters:  03/20/16 (!) 112/59   Pulse Readings from Last 1 Encounters:  03/20/16 70   Resp Readings from Last 1 Encounters:  03/20/16 20   Temp Readings from Last 1 Encounters:  03/20/16 99.1 F (37.3 C)    Hemodynamics: PAP: (22-53)/(5-28) 31/10 CO:  [3.1 L/min-4.8 L/min] 4.7 L/min CI:  [1.7 L/min/m2-2.3 L/min/m2] 2.3 L/min/m2  Physical Exam:  Rhythm:   sinus  Breath sounds: Scattered rhonchi  Heart sounds:  RRR w/out murmur  Incisions:  Dressings dry, intact  Abdomen:  Soft, non-distended, non-tender  Extremities:  Warm, well-perfused  Chest tubes:  Decreasing but significant volume thin serosanguinous output, no air leak    Intake/Output from previous day: 10/17 0701 - 10/18 0700 In: 6676.4 [P.O.:120; I.V.:5702.4; Blood:254; IV Piggyback:600] Out: 3305 [Urine:2795; Chest Tube:510] Intake/Output this shift: Total I/O In: 75 [I.V.:75] Out: 185 [Urine:105; Chest Tube:80]  Lab Results:  CBC: Recent Labs  03/19/16 2020 03/19/16 2043 03/20/16 0404  WBC 12.1*  --  11.8*  HGB 11.5* 11.2* 10.7*  HCT 34.1* 33.0* 31.6*  PLT 163  --  92*    BMET:  Recent Labs  03/19/16 0528  03/19/16 2043 03/20/16 0404  NA 138  < > 136 134*  K 4.1  < > 5.1 4.8  CL 104  < > 106 108  CO2 27  --   --  22  GLUCOSE 96  < > 154* 130*  BUN 19  < > 17 13  CREATININE 1.44*  < > 1.10 1.19  CALCIUM 9.0  --   --  7.5*  < > = values in this interval not displayed.   PT/INR:   Recent Labs  03/19/16 1430  LABPROT 18.0*  INR 1.47    CBG (last 3)   Recent Labs  03/19/16 2358 03/20/16 0343 03/20/16 0730   GLUCAP 167* 116* 124*    ABG    Component Value Date/Time   PHART 7.416 03/19/2016 2153   PCO2ART 29.9 (L) 03/19/2016 2153   PO2ART 108.0 03/19/2016 2153   HCO3 19.3 (L) 03/19/2016 2153   TCO2 20 03/19/2016 2153   ACIDBASEDEF 4.0 (H) 03/19/2016 2153   O2SAT 98.0 03/19/2016 2153    CXR: Mild bibasilar atelectasis, mild opacity left chest c/w small effusion  EKG: NSR w/out acute ischemic changes, 1st degree AV block and RBBB   Assessment/Plan: S/P Procedure(s) (LRB): MINIMALLY INVASIVE MITRAL VALVE REPAIR (MVR) (Right) TRANSESOPHAGEAL ECHOCARDIOGRAM (TEE) (N/A)  Overall doing very well POD1 Maintaining NSR w/ stable hemodynamics on IV NTG for hypertension Breathing comfortably w/ O2 sats 95-100% on 4 L/min  Chronic diastolic CHF with expected post-op volume excess, mild Expected post op acute blood loss anemia, mild Expected post op atelectasis, mild Post op thrombocytopenia, platelet count down to 92k this morning CKD stage III, adequate UOP and stable creatinine Pre-op history of bifascicular block and NSVT    Mobilize  Diuresis  Hold beta blockers for now and watch rhythm, AV block  Watch platelet count and avoid heparin/lovenox  Start coumadin  Leave chest tubes  in until output decreased   [name], MD 03/20/2016 8:46 AM

## 2016-03-20 NOTE — Progress Notes (Signed)
Patient ID: Roy Palmer, male   DOB: [birth year], 82 y.o.   MRN: [id number] EVENING ROUNDS NOTE :     [address]       [place] [address]             [phone]                 1 Day Post-Op Procedure(s) (LRB): MINIMALLY INVASIVE MITRAL VALVE REPAIR (MVR) (Right) TRANSESOPHAGEAL ECHOCARDIOGRAM (TEE) (N/A)  Total Length of Stay:  LOS: 8 days  BP 114/72 (BP Location: Left Arm)   Pulse 78   Temp 98.8 F (37.1 C) (Oral)   Resp (!) 25   Ht 5\' 11"  (1.803 m)   Wt 187 lb 9.8 oz (85.1 kg)   SpO2 93%   BMI 26.17 kg/m   .Intake/Output      10/18 0701 - 10/19 0700   P.O.    I.V. (mL/kg) 361.3 (4.2)   Blood    Other 20   IV Piggyback 50   Total Intake(mL/kg) 431.3 (5.1)   Urine (mL/kg/hr) 1190 (1.1)   Chest Tube 470 (0.4)   Total Output 1660   Net -1228.8         . sodium chloride    . nitroGLYCERIN Stopped (03/20/16 0945)     Lab Results  Component Value Date   WBC 18.9 (H) 03/20/2016   HGB 11.5 (L) 03/20/2016   HCT 35.1 (L) 03/20/2016   PLT 83 (L) 03/20/2016   GLUCOSE 160 (H) 03/20/2016   CHOL 182 03/13/2016   TRIG 163 (H) 03/13/2016   HDL 33 (L) 03/13/2016   LDLCALC 116 (H) 03/13/2016   ALT 23 03/15/2016   AST 20 03/15/2016   NA 134 (L) 03/20/2016   K 4.4 03/20/2016   CL 98 (L) 03/20/2016   CREATININE 1.37 (H) 03/20/2016   BUN 17 03/20/2016   CO2 22 03/20/2016   TSH 1.554 03/12/2016   INR 1.47 03/19/2016   HGBA1C 5.8 (H) 03/15/2016   Up in chair, feels better then preop  Delight Ovens MD  Beeper [phone] Office [phone] 03/20/2016 8:07 PM

## 2016-03-20 NOTE — Care Management Note (Signed)
Case Management Note  Patient Details  Name: Roy Palmer MRN: [id number] Date of Birth: Dec 31, 1929  Subjective/Objective:   S/p MVR            Action/Plan:  PTA from home with wife independent - wife states she has walker and cane in the home if pt needs mobilization assistance at discharge.  Wife will be with pt post discharge.  CM will continue to follow for discharge needs  Expected Discharge Date:                  Expected Discharge Plan:  Home/Self Care  In-House Referral:     Discharge planning Services  CM Consult  Post Acute Care Choice:    Choice offered to:     DME Arranged:    DME Agency:     HH Arranged:    HH Agency:     Status of Service:  In process, will continue to follow  If discussed at Long Length of Stay Meetings, dates discussed:    Additional Comments:  Cherylann Parr, RN 03/20/2016, 10:19 AM

## 2016-03-21 ENCOUNTER — Inpatient Hospital Stay (HOSPITAL_COMMUNITY): Payer: Medicare Other

## 2016-03-21 LAB — CBC
HCT: 32.4 % — ABNORMAL LOW (ref 39.0–52.0)
Hemoglobin: 10.8 g/dL — ABNORMAL LOW (ref 13.0–17.0)
MCH: 31.1 pg (ref 26.0–34.0)
MCHC: 33.3 g/dL (ref 30.0–36.0)
MCV: 93.4 fL (ref 78.0–100.0)
Platelets: 62 10*3/uL — ABNORMAL LOW (ref 150–400)
RBC: 3.47 MIL/uL — ABNORMAL LOW (ref 4.22–5.81)
RDW: 14.5 % (ref 11.5–15.5)
WBC: 15.5 10*3/uL — ABNORMAL HIGH (ref 4.0–10.5)

## 2016-03-21 LAB — GLUCOSE, CAPILLARY
Glucose-Capillary: 111 mg/dL — ABNORMAL HIGH (ref 65–99)
Glucose-Capillary: 119 mg/dL — ABNORMAL HIGH (ref 65–99)
Glucose-Capillary: 155 mg/dL — ABNORMAL HIGH (ref 65–99)
Glucose-Capillary: 153 mg/dL — ABNORMAL HIGH (ref 65–99)

## 2016-03-21 LAB — BASIC METABOLIC PANEL
Anion gap: 6 (ref 5–15)
BUN: 16 mg/dL (ref 6–20)
CO2: 23 mmol/L (ref 22–32)
Calcium: 7.8 mg/dL — ABNORMAL LOW (ref 8.9–10.3)
Chloride: 102 mmol/L (ref 101–111)
Creatinine, Ser: 1.26 mg/dL — ABNORMAL HIGH (ref 0.61–1.24)
GFR calc Af Amer: 59 mL/min — ABNORMAL LOW (ref >60)
GFR calc non Af Amer: 51 mL/min — ABNORMAL LOW (ref >60)
Glucose, Bld: 115 mg/dL — ABNORMAL HIGH (ref 65–99)
Potassium: 4.1 mmol/L (ref 3.5–5.1)
Sodium: 131 mmol/L — ABNORMAL LOW (ref 135–145)

## 2016-03-21 LAB — PROTIME-INR
INR: 1.33
Prothrombin Time: 16.6 seconds — ABNORMAL HIGH (ref 11.4–15.2)

## 2016-03-21 MED ORDER — POTASSIUM CHLORIDE CRYS ER 20 MEQ PO TBCR
20.0000 meq | EXTENDED_RELEASE_TABLET | Freq: Every day | ORAL | Status: DC
  Administered 2016-03-22: 20 meq via ORAL
  Filled 2016-03-21: qty 1

## 2016-03-21 MED ORDER — FUROSEMIDE 10 MG/ML IJ SOLN
20.0000 mg | Freq: Two times a day (BID) | INTRAMUSCULAR | Status: AC
Start: 2016-03-21 — End: 2016-03-21
  Administered 2016-03-21 (×2): 20 mg via INTRAVENOUS
  Filled 2016-03-21 (×2): qty 2

## 2016-03-21 MED ORDER — ASPIRIN EC 81 MG PO TBEC
81.0000 mg | DELAYED_RELEASE_TABLET | Freq: Every day | ORAL | Status: DC
  Administered 2016-03-21 – 2016-03-29 (×9): 81 mg via ORAL
  Filled 2016-03-21 (×9): qty 1

## 2016-03-21 MED ORDER — SODIUM CHLORIDE 0.9 % IV SOLN
250.0000 mL | INTRAVENOUS | Status: DC | PRN
  Administered 2016-03-25: 250 mL via INTRAVENOUS

## 2016-03-21 MED ORDER — FUROSEMIDE 40 MG PO TABS
40.0000 mg | ORAL_TABLET | Freq: Every day | ORAL | Status: DC
  Administered 2016-03-22: 40 mg via ORAL
  Filled 2016-03-21: qty 1

## 2016-03-21 MED ORDER — MOVING RIGHT ALONG BOOK
Freq: Once | Status: AC
  Administered 2016-03-21: 09:00:00
  Filled 2016-03-21: qty 1

## 2016-03-21 MED ORDER — ATORVASTATIN CALCIUM 20 MG PO TABS
20.0000 mg | ORAL_TABLET | Freq: Every day | ORAL | Status: DC
  Administered 2016-03-22: 20 mg via ORAL
  Filled 2016-03-21 (×2): qty 1

## 2016-03-21 MED ORDER — METOPROLOL TARTRATE 12.5 MG HALF TABLET
12.5000 mg | ORAL_TABLET | Freq: Two times a day (BID) | ORAL | Status: DC
  Administered 2016-03-21 – 2016-03-24 (×8): 12.5 mg via ORAL
  Filled 2016-03-21 (×8): qty 1

## 2016-03-21 MED ORDER — SODIUM CHLORIDE 0.9% FLUSH
3.0000 mL | Freq: Two times a day (BID) | INTRAVENOUS | Status: DC
  Administered 2016-03-21 – 2016-03-28 (×15): 3 mL via INTRAVENOUS

## 2016-03-21 MED ORDER — SODIUM CHLORIDE 0.9% FLUSH: 3.0000 mL | INTRAVENOUS | Status: DC | PRN

## 2016-03-21 NOTE — Progress Notes (Signed)
      [address]       [place] [address]             [phone]        CARDIOTHORACIC SURGERY PROGRESS NOTE   R2 Days Post-Op Procedure(s) (LRB): MINIMALLY INVASIVE MITRAL VALVE REPAIR (MVR) (Right) TRANSESOPHAGEAL ECHOCARDIOGRAM (TEE) (N/A)  Subjective: Doing well.  No complaints.  Breathing better than preop.  Minimal soreness.  Not much of an appetite but drinking liquids and no abdominal discomfort.  Objective: Vital signs: BP Readings from Last 1 Encounters:  03/21/16 113/63   Pulse Readings from Last 1 Encounters:  03/21/16 67   Resp Readings from Last 1 Encounters:  03/21/16 14   Temp Readings from Last 1 Encounters:  03/21/16 98.5 F (36.9 C) (Oral)    Hemodynamics: PAP: (31-38)/(10-13) 38/11 CO:  [4.7 L/min] 4.7 L/min CI:  [2.3 L/min/m2] 2.3 L/min/m2  Physical Exam:  Rhythm:   sinus  Breath sounds: clear  Heart sounds:  RRR w/out murmur  Incisions:  Dressings dry, intact  Abdomen:  Soft, non-distended, non-tender  Extremities:  Warm, well-perfused  Chest tubes:  decreasing volume thin serosanguinous output, no air leak    Intake/Output from previous day: 10/18 0701 - 10/19 0700 In: 681.3 [I.V.:361.3; IV Piggyback:100] Out: 2290 [Urine:1530; Chest Tube:760] Intake/Output this shift: No intake/output data recorded.  Lab Results:  CBC: Recent Labs  03/20/16 1810 03/21/16 0500  WBC 18.9* 15.5*  HGB 11.5* 10.8*  HCT 35.1* 32.4*  PLT 83* 62*    BMET:  Recent Labs  03/20/16 0404 03/20/16 1808 03/20/16 1810 03/21/16 0500  NA 134* 134*  --  131*  K 4.8 4.4  --  4.1  CL 108 98*  --  102  CO2 22  --   --  23  GLUCOSE 130* 160*  --  115*  BUN 13 17  --  16  CREATININE 1.19 1.30* 1.37* 1.26*  CALCIUM 7.5*  --   --  7.8*     PT/INR:   Recent Labs  03/21/16 0500  LABPROT 16.6*  INR 1.33    CBG (last 3)   Recent Labs  03/20/16 2309 03/21/16 0501 03/21/16 0710  GLUCAP 127* 111* 119*    ABG      Component Value Date/Time   PHART 7.416 03/19/2016 2153   PCO2ART 29.9 (L) 03/19/2016 2153   PO2ART 108.0 03/19/2016 2153   HCO3 19.3 (L) 03/19/2016 2153   TCO2 23 03/20/2016 1808   ACIDBASEDEF 4.0 (H) 03/19/2016 2153   O2SAT 98.0 03/19/2016 2153    CXR: Looks good.  Mild bibasilar atelectasis and pulm vasc congestion  Assessment/Plan: S/P Procedure(s) (LRB): MINIMALLY INVASIVE MITRAL VALVE REPAIR (MVR) (Right) TRANSESOPHAGEAL ECHOCARDIOGRAM (TEE) (N/A)  Doing very well POD2 Maintaining NSR w/ stable BP Breathing comfortably w/ O2 sats 93-96% on RA Chronic diastolic CHF with expected post-op volume excess, mild Expected post op acute blood loss anemia, mild Expected post op atelectasis, mild Post op thrombocytopenia, platelet count down to 62k this morning CKD stage III, adequate UOP and stable creatinine Pre-op history of bifascicular block and NSVT    Mobilize  Diuresis  Restart low dose beta blocker and watch rhythm, AV block  Watch platelet count and avoid heparin/lovenox  Continue coumadin  Leave chest tubes in until output decreased  Transfer step down   Roy Nails, MD 03/21/2016 7:48 AM

## 2016-03-21 NOTE — Progress Notes (Signed)
Patient ID: Roy Palmer, male   DOB: 07/08/1929, 82 y.o.   MRN: [id number]  SICU Evening rounds  Hemodynamically stable   Urine output good  Awaiting bed on 2W stepdown.

## 2016-03-21 NOTE — Care Management Important Message (Signed)
Important Message  Patient Details  Name: Roy Palmer MRN: [id number] Date of Birth: 02-06-1930   Medicare Important Message Given:  Yes    [name] 03/21/2016, 10:43 AM

## 2016-03-22 ENCOUNTER — Other Ambulatory Visit: Payer: Self-pay

## 2016-03-22 ENCOUNTER — Inpatient Hospital Stay (HOSPITAL_COMMUNITY): Payer: Medicare Other

## 2016-03-22 ENCOUNTER — Other Ambulatory Visit (HOSPITAL_COMMUNITY): Payer: Medicare Other

## 2016-03-22 ENCOUNTER — Encounter (HOSPITAL_COMMUNITY): Payer: Medicare Other

## 2016-03-22 DIAGNOSIS — I313 Pericardial effusion (noninflammatory): Secondary | ICD-10-CM

## 2016-03-22 DIAGNOSIS — I471 Supraventricular tachycardia: Secondary | ICD-10-CM

## 2016-03-22 LAB — BASIC METABOLIC PANEL
Anion gap: 4 — ABNORMAL LOW (ref 5–15)
BUN: 18 mg/dL (ref 6–20)
CO2: 26 mmol/L (ref 22–32)
Calcium: 7.9 mg/dL — ABNORMAL LOW (ref 8.9–10.3)
Chloride: 104 mmol/L (ref 101–111)
Creatinine, Ser: 1.32 mg/dL — ABNORMAL HIGH (ref 0.61–1.24)
GFR calc Af Amer: 55 mL/min — ABNORMAL LOW (ref >60)
GFR calc non Af Amer: 48 mL/min — ABNORMAL LOW (ref >60)
Glucose, Bld: 89 mg/dL (ref 65–99)
Potassium: 4.1 mmol/L (ref 3.5–5.1)
Sodium: 134 mmol/L — ABNORMAL LOW (ref 135–145)

## 2016-03-22 LAB — CBC
HCT: 31 % — ABNORMAL LOW (ref 39.0–52.0)
Hemoglobin: 10.4 g/dL — ABNORMAL LOW (ref 13.0–17.0)
MCH: 31.4 pg (ref 26.0–34.0)
MCHC: 33.5 g/dL (ref 30.0–36.0)
MCV: 93.7 fL (ref 78.0–100.0)
Platelets: 71 10*3/uL — ABNORMAL LOW (ref 150–400)
RBC: 3.31 MIL/uL — ABNORMAL LOW (ref 4.22–5.81)
RDW: 14.5 % (ref 11.5–15.5)
WBC: 12.9 10*3/uL — ABNORMAL HIGH (ref 4.0–10.5)

## 2016-03-22 LAB — ECHOCARDIOGRAM LIMITED
Height: 71 in
Weight: 3200 oz

## 2016-03-22 LAB — PROTIME-INR
INR: 1.25
Prothrombin Time: 15.8 seconds — ABNORMAL HIGH (ref 11.4–15.2)

## 2016-03-22 MED ORDER — ADENOSINE 6 MG/2ML IV SOLN: 6.0000 mg | Freq: Once | INTRAVENOUS | Status: DC

## 2016-03-22 MED ORDER — SODIUM CHLORIDE 0.9% FLUSH: 10.0000 mL | INTRAVENOUS | Status: DC | PRN

## 2016-03-22 MED ORDER — AMIODARONE LOAD VIA INFUSION
150.0000 mg | Freq: Once | INTRAVENOUS | Status: AC
  Administered 2016-03-22: 150 mg via INTRAVENOUS
  Filled 2016-03-22: qty 83.34

## 2016-03-22 MED ORDER — SODIUM CHLORIDE 0.9% FLUSH
10.0000 mL | Freq: Two times a day (BID) | INTRAVENOUS | Status: DC
  Administered 2016-03-23 – 2016-03-25 (×5): 10 mL
  Administered 2016-03-26: 30 mL
  Administered 2016-03-28: 3 mL

## 2016-03-22 MED ORDER — CALCIUM CHLORIDE 10 % IV SOLN
INTRAVENOUS | Status: AC
  Administered 2016-03-22: 12:00:00
  Filled 2016-03-22: qty 10

## 2016-03-22 MED ORDER — AMIODARONE HCL IN DEXTROSE 360-4.14 MG/200ML-% IV SOLN
60.0000 mg/h | INTRAVENOUS | Status: AC
  Administered 2016-03-22 (×2): 60 mg/h via INTRAVENOUS

## 2016-03-22 MED ORDER — AMIODARONE IV BOLUS ONLY 150 MG/100ML
150.0000 mg | Freq: Once | INTRAVENOUS | Status: AC
  Administered 2016-03-22: 150 mg via INTRAVENOUS

## 2016-03-22 MED ORDER — MIDAZOLAM HCL 2 MG/2ML IJ SOLN
1.0000 mg | Freq: Once | INTRAMUSCULAR | Status: AC
  Administered 2016-03-22: 1 mg via INTRAVENOUS

## 2016-03-22 MED ORDER — AMIODARONE HCL IN DEXTROSE 360-4.14 MG/200ML-% IV SOLN
30.0000 mg/h | INTRAVENOUS | Status: DC
  Administered 2016-03-22: 30 mg/h via INTRAVENOUS
  Filled 2016-03-22 (×2): qty 200

## 2016-03-22 MED ORDER — PHENYLEPHRINE HCL 10 MG/ML IJ SOLN
0.0000 ug/min | INTRAVENOUS | Status: DC
  Filled 2016-03-22: qty 4

## 2016-03-22 MED ORDER — AMIODARONE HCL IN DEXTROSE 360-4.14 MG/200ML-% IV SOLN
INTRAVENOUS | Status: AC
  Filled 2016-03-22: qty 400

## 2016-03-22 MED ORDER — MIDAZOLAM HCL 2 MG/2ML IJ SOLN
INTRAMUSCULAR | Status: AC
  Filled 2016-03-22: qty 2

## 2016-03-22 MED ORDER — PHENYLEPHRINE HCL 10 MG/ML IJ SOLN
0.0000 ug/min | INTRAVENOUS | Status: DC
  Administered 2016-03-22: 5 ug/min via INTRAVENOUS
  Filled 2016-03-22: qty 1

## 2016-03-22 MED ORDER — FENTANYL 2500MCG IN NS 250ML (10MCG/ML) PREMIX INFUSION: 40.0000 ug/h | INTRAVENOUS | Status: DC

## 2016-03-22 MED ORDER — ADENOSINE 6 MG/2ML IV SOLN
INTRAVENOUS | Status: AC
  Filled 2016-03-22: qty 2

## 2016-03-22 MED ORDER — CALCIUM GLUCONATE 10 % IV SOLN
1.0000 g | Freq: Once | INTRAVENOUS | Status: AC
  Filled 2016-03-22: qty 10

## 2016-03-22 MED ORDER — ALBUMIN HUMAN 5 % IV SOLN: 12.5000 g | Freq: Once | INTRAVENOUS | Status: AC

## 2016-03-22 MED ORDER — ALBUMIN HUMAN 5 % IV SOLN
INTRAVENOUS | Status: AC
  Administered 2016-03-22: 12.5 g
  Filled 2016-03-22: qty 250

## 2016-03-22 NOTE — Plan of Care (Signed)
Problem: Activity: Goal: Risk for activity intolerance will decrease Outcome: Progressing Patient ambulated 600 ft with no assist.  Problem: Respiratory: Goal: Respiratory status will improve Outcome: Progressing Patient breathing comfortably on room air.  Problem: Pain Management: Goal: Pain level will decrease Outcome: Progressing Patient has no complaints of pain all night.

## 2016-03-22 NOTE — Progress Notes (Signed)
TCTS BRIEF SICU PROGRESS NOTE  3 Days Post-Op  S/P Procedure(s) (LRB): MINIMALLY INVASIVE MITRAL VALVE REPAIR (MVR) (Right) TRANSESOPHAGEAL ECHOCARDIOGRAM (TEE) (N/A)   Maintaining NSR on IV amiodarone since DCCV performed by Dr. Donata Clay earlier today for sustained wide complex tachycardia Patient is already complaining about taking heart medications  Plan: Continue low dose beta blocker and IV amiodarone as recommended by Dr. Ladona Ridgel.  Keep in ICU for now.  Recheck potassium and magnesium in am.  [name], MD 03/22/2016 8:03 PM

## 2016-03-22 NOTE — Progress Notes (Addendum)
Pt back in SVT 140s, Dr. Donata Clay notified, second bolus of Amio given over through CVL, Lopressor 5mg  given, order to have adenosine ready at bedside, pt placed back on Zoll, while on phone with MD, pt converted out of SVT, SB 50s, will continue to monitor.   Darrel Hoover

## 2016-03-22 NOTE — Progress Notes (Signed)
CT surgery note  Called  see patient for SVT heart rate 170, hypotensive  W/ blood pressure 78 systolic. The patient just had epicardial pacing wires pulled. Central venous access had been discontinued. IV amiodarone and 5 mg IV Lopressor ordered by Dr. Cornelius Moras. Heart rate did not decrease. Patient was on 100% nonrebreather with decreased level of responsiveness. Patient was given 1 mg of Versed and then cardioverted with 20 J out of SVT back to a junctional rhythm heart rate 70 Blood pressure and mental status improved  A central line triple-lumen catheter was placed under sterile technique to the right subclavian vein for IV amiodarone, and IV phenylephrine as needed to maintain blood pressure greater than 90 systolic.  Follow-up chest x-ray performed showing no change in mediastinal silhouette following removal pacing wires. New central line in good position. No pneumothorax. Transthoracic limited 2-D echocardiogram to be performed to rule out Cardiac tamponade.  Patient's transfer to stepdown is on hold.

## 2016-03-22 NOTE — Progress Notes (Signed)
      [address]       [name] [address]             [phone]        CARDIOTHORACIC SURGERY PROGRESS NOTE   R3 Days Post-Op Procedure(s) (LRB): MINIMALLY INVASIVE MITRAL VALVE REPAIR (MVR) (Right) TRANSESOPHAGEAL ECHOCARDIOGRAM (TEE) (N/A)  Subjective: Feels well.  No complaints.  Objective: Vital signs: BP Readings from Last 1 Encounters:  03/22/16 115/67   Pulse Readings from Last 1 Encounters:  03/22/16 73   Resp Readings from Last 1 Encounters:  03/22/16 12   Temp Readings from Last 1 Encounters:  03/22/16 98 F (36.7 C) (Oral)    Hemodynamics:    Physical Exam:  Rhythm:   Sinus w/ PVC's  Breath sounds: clear  Heart sounds:  RRR w/out murmur  Incisions:  Clean and dry  Abdomen:  Soft, non-distended, non-tender  Extremities:  Warm, well-perfused  Chest tubes:  decreasing volume thin serosanguinous output, no air leak    Intake/Output from previous day: 10/19 0701 - 10/20 0700 In: 610 [P.O.:600] Out: 1895 [Urine:1505; Chest Tube:390] Intake/Output this shift: No intake/output data recorded.  Lab Results:  CBC: Recent Labs  03/21/16 0500 03/22/16 0402  WBC 15.5* 12.9*  HGB 10.8* 10.4*  HCT 32.4* 31.0*  PLT 62* 71*    BMET:  Recent Labs  03/21/16 0500 03/22/16 0402  NA 131* 134*  K 4.1 4.1  CL 102 104  CO2 23 26  GLUCOSE 115* 89  BUN 16 18  CREATININE 1.26* 1.32*  CALCIUM 7.8* 7.9*     PT/INR:   Recent Labs  03/22/16 0402  LABPROT 15.8*  INR 1.25    CBG (last 3)   Recent Labs  03/21/16 0710 03/21/16 1124 03/21/16 1621  GLUCAP 119* 155* 153*    ABG    Component Value Date/Time   PHART 7.416 03/19/2016 2153   PCO2ART 29.9 (L) 03/19/2016 2153   PO2ART 108.0 03/19/2016 2153   HCO3 19.3 (L) 03/19/2016 2153   TCO2 23 03/20/2016 1808   ACIDBASEDEF 4.0 (H) 03/19/2016 2153   O2SAT 98.0 03/19/2016 2153    CXR: n/a  Assessment/Plan: S/P Procedure(s) (LRB): MINIMALLY INVASIVE MITRAL VALVE  REPAIR (MVR) (Right) TRANSESOPHAGEAL ECHOCARDIOGRAM (TEE) (N/A)  Doing very well POD3 Maintaining NSR w/ stable BP Breathing comfortably w/ O2 sats 93-96% on RA Chronic diastolic CHF with expected post-op volume excess, mild Expected post op acute blood loss anemia, mild Expected post op atelectasis, mild Post op thrombocytopenia, platelet count up to 71k this morning CKD stage III, adequate UOP and stable creatinine Pre-op history of bifascicular block and NSVT    Mobilize  Diuresis  Continue low dose beta blocker and watch rhythm, AV block  Watch platelet count and avoid heparin/lovenox  Continue coumadin  Leave chest tubes in today but likely d/c them tomorrow  D/C pacing wires  Awaiting bed for transfer to step down  [name], MD 03/22/2016 8:52 AM

## 2016-03-22 NOTE — Progress Notes (Signed)
EPW removed, pt okay for 10 mins, new onset SVT 160s noted, sbp 60-70s, Dr. Cornelius Moras paged in OR, orders to give Lopressor 5mg  and if pt still in SVT start amio drip, no response to Lopressor, before amio drip was started pt with new onset confusion, pale and diaphoretic, Dr. Donata Clay notified pt placed on Zoll monitor. Orders to give albumin with calcium and stat 12 lead per Dr. Donata Clay, NRB mask applied, amio bolus given over 30 mins through PIV, MD soon at bedside, versed 1mg  given, pt shocked x1 with 20j, pt out of SVT, low bp, neo ordered, CVL placed with sterile procedure, amio 60mg  infusing, neo started, CXR and ECHO done, pt stable and resting, will continue to monitor.  Darrel Hoover

## 2016-03-22 NOTE — Consult Note (Signed)
Patient Name: Roy NeatJohn A Palmer Date of Encounter: 03/22/2016  Primary Cardiologist: Dr Okey DupreEnd  Hospital Problem List     Principal Problem:   S/P minimally invasive mitral valve repair Active Problems:   Severe mitral regurgitation   Near syncope -- cardiogenic   Symptomatic PVCs   Non-sustained ventricular tachycardia (HCC)   Incidental pulmonary nodule   AAA (abdominal aortic aneurysm) without rupture (HCC)   Thoracic aortic aneurysm (HCC)   Vasovagal syncope   Mild CAD   Essential hypertension   CKD (chronic kidney disease), stage III   Hypertensive heart and kidney disease   Hyperlipidemia   SVT (supraventricular tachycardia) (HCC)     Subjective   Feeling good now. No complaints  Inpatient Medications    Scheduled Meds: . acetaminophen  1,000 mg Oral Q6H  . adenosine (ADENOCARD) IV  6 mg Intravenous Once  . adenosine      . aspirin EC  81 mg Oral Daily  . atorvastatin  20 mg Oral q1800  . bisacodyl  10 mg Oral Daily   Or  . bisacodyl  10 mg Rectal Daily  . docusate sodium  200 mg Oral Daily  . furosemide  40 mg Oral Daily  . metoprolol tartrate  12.5 mg Oral BID  . pantoprazole  40 mg Oral Daily  . potassium chloride  20 mEq Oral Daily  . sodium chloride flush  3 mL Intravenous Q12H  . sodium chloride flush  3 mL Intravenous Q12H  . warfarin  2.5 mg Oral q1800  . Warfarin - Physician Dosing Inpatient   Does not apply q1800   Continuous Infusions: . amiodarone 60 mg/hr (03/22/16 1506)   Followed by  . amiodarone    . phenylephrine (NEO-SYNEPHRINE) Adult infusion     PRN Meds: sodium chloride, metoprolol, morphine injection, ondansetron (ZOFRAN) IV, sodium chloride flush, sodium chloride flush, traMADol   Vital Signs    Vitals:   03/22/16 1500 03/22/16 1515 03/22/16 1530 03/22/16 1545  BP: 118/65 105/63 117/65 122/66  Pulse: (!) 57 (!) 55 (!) 55 (!) 49  Resp: (!) 23 16 20 17   Temp: 97.4 F (36.3 C)     TempSrc:      SpO2: 100% 97% 99% 98%    Weight:      Height:        Intake/Output Summary (Last 24 hours) at 03/22/16 1557 Last data filed at 03/22/16 1500  Gross per 24 hour  Intake              600 ml  Output             1800 ml  Net            -1200 ml   Filed Weights   03/20/16 0500 03/21/16 0600 03/22/16 0600  Weight: 187 lb 9.8 oz (85.1 kg) 202 lb 4.8 oz (91.8 kg) 200 lb (90.7 kg)    Physical Exam   GEN: Well nourished, well developed, in no acute distress.  HEENT: Grossly normal.  Neck: Supple, no JVD, carotid bruits, or masses. Cardiac: RRR, no murmurs, rubs, or gallops. No clubbing, cyanosis, edema.  Radials/DP/PT 2+ and equal bilaterally.  Respiratory:  Respirations regular and unlabored, clear to auscultation bilaterally. GI: Soft, nontender, nondistended, BS + x 4. MS: no deformity or atrophy. Skin: warm and dry, no rash. Neuro:  Strength and sensation are intact. Psych: AAOx3.  Normal affect.  Labs    CBC  Recent Labs  03/21/16 0500 03/22/16 0402  WBC 15.5* 12.9*  HGB 10.8* 10.4*  HCT 32.4* 31.0*  MCV 93.4 93.7  PLT 62* 71*   Basic Metabolic Panel  Recent Labs  03/20/16 0404  03/20/16 1810 03/21/16 0500 03/22/16 0402  NA 134*  < >  --  131* 134*  K 4.8  < >  --  4.1 4.1  CL 108  < >  --  102 104  CO2 22  --   --  23 26  GLUCOSE 130*  < >  --  115* 89  BUN 13  < >  --  16 18  CREATININE 1.19  < > 1.37* 1.26* 1.32*  CALCIUM 7.5*  --   --  7.8* 7.9*  MG 2.5*  --  2.4  --   --   < > = values in this interval not displayed. Liver Function Tests No results for input(s): AST, ALT, ALKPHOS, BILITOT, PROT, ALBUMIN in the last 72 hours. No results for input(s): LIPASE, AMYLASE in the last 72 hours. Cardiac Enzymes No results for input(s): CKTOTAL, CKMB, CKMBINDEX, TROPONINI in the last 72 hours. BNP Invalid input(s): POCBNP D-Dimer No results for input(s): DDIMER in the last 72 hours. Hemoglobin A1C No results for input(s): HGBA1C in the last 72 hours. Fasting Lipid Panel No  results for input(s): CHOL, HDL, LDLCALC, TRIG, CHOLHDL, LDLDIRECT in the last 72 hours. Thyroid Function Tests No results for input(s): TSH, T4TOTAL, T3FREE, THYROIDAB in the last 72 hours.  Invalid input(s): FREET3  Telemetry    Sinus brady currently with previous SVT with RBBB - Personally Reviewed  ECG    Wide complex tachycardia- HR 150 and regular - Personally Reviewed  Radiology    Dg Chest Port 1 View  Result Date: 03/22/2016 CLINICAL DATA:  Complications after pacing wires removal, at risk for respiratory distress. Central line has been placed. Pt states he has no chest complaints at this time. EXAM: PORTABLE CHEST 1 VIEW COMPARISON:  Chest x-ray dated 03/21/2016. FINDINGS: Heart size and mediastinal contours are stable. Pacer pad overlies the left heart border. Mild central pulmonary vascular congestion and interstitial edema without overt alveolar pulmonary edema. No pleural effusion or pneumothorax seen. IMPRESSION: Cardiomegaly with central pulmonary vascular congestion mild interstitial edema suggesting mild CHF/volume overload. Perhaps mild additional atelectasis at the left lung base, but not convincing. No evidence of pneumonia. Electronically Signed   By: Bary Richard M.D.   On: 03/22/2016 12:00   Dg Chest Port 1 View  Result Date: 03/21/2016 CLINICAL DATA:  Atelectasis and shortness of breath EXAM: PORTABLE CHEST 1 VIEW COMPARISON:  03/20/16 FINDINGS: Cardiac shadow is enlarged but stable. Postoperative changes are again seen. The previously noted Swan-Ganz catheter has been removed. Right-sided thoracostomy catheter remains in place. No pneumothorax is seen. No significant infiltrate is noted. A pericardial drain remains in place. Left jugular central venous sheath is again noted and kinked at skin surface. Minimal right basilar atelectasis is noted. IMPRESSION: Postsurgical changes with tubes and lines as described above. Minimal right basilar atelectasis remains.  Electronically Signed   By: Alcide Clever M.D.   On: 03/21/2016 08:00    Cardiac Studies   2D ECHO: 03/22/2016 LV EF: 40% -   45% Study Conclusions - Left ventricle: The cavity size was normal. There was moderate   concentric hypertrophy. Systolic function was mildly to   moderately reduced. The estimated ejection fraction was in the   range of 40% to 45%. Possible inferior hypokinesis and   incoordinate septal  motion. - Mitral valve: Annuloplasty ring in place. Trivial regurgitation   without significant stenosis. - Left atrium: The atrium was mildly dilated. - Right ventricle: Normal size with moderately reduced RV systolic   function. - Right atrium: The atrium was normal in size. - Pulmonary arteries: PA peak pressure: 41 mm Hg (S). - Systemic veins: The IVC measures <2.1 cm but does not collapse   >50%, suggesting an elevated RA pressure of 8 mmHg. - Pericardium, extracardiac: There was no pericardial effusion.   Features were not consistent with tamponade physiology. Impressions: - Compared to a prior study in 02/2016, the LVEF is lower at 40-45%   with possible inferior wall hypokinesis and incoordinate septal   motion. There is no pericardial effusion or concern for   tamponade, however, there is likely moderate RV dysfunction and   mild pulmonary hypertension with an RVSP of 41 mmHg.   Patient Profile     AUGUST ROGINSKI is a 82 y.o. male with a history of CKD, HTN, HLD, severe symptomatic MR (flail P2) and PVCs/NSVT who presented to [facility] with dizziness and weakness. He underwent minimally invasive MV repair on 10/17. Today he had an episode of HD unstable SVT requiring DCCV. EP is asked to re round for help in management.    Assessment & Plan    SVT: patient had hemodynamically unstable SVT with HR in 170s and SBP 78 and AMS that required DCCV ( IV amiodarone and 5 mg IV Lopressor did not improve rate). Currently on IV amiodarone and neo-synephrine and feeling much  better. BP now normal  Stage D severe symptomatic primary mitral regurgitation s/p minimally invasive MV repair: doing well post op.   PVC's, NSVT: Contnue Lopressor 12.5mg  BID.   Bradycardia: min HR 50 on his holter, EKG with RBBB, LAFB. Currently HR in [address]  Signed, Cline Crock, PA-C  03/22/2016, 3:57 PM   EP Attending  Patient seen and examined. I have reviewed the ECG's. I strongly suspect his rhythm is not SVT but Ventricular tachycardia. Continue IV amiodarone as you are doing. We can transition to oral amiodarone. I would hope that we can avoid ICD as his age is advanced and his VT rate is slow and would be more difficult to treat. Would give 36-48 hours of IV amiodarone and transition to po. Continue low dose beta blocker as well.  Leonia Reeves.D.

## 2016-03-22 NOTE — Progress Notes (Signed)
  Echocardiogram 2D Echocardiogram has been performed.  Arvil Chaco 03/22/2016, 12:21 PM

## 2016-03-23 LAB — BASIC METABOLIC PANEL
Anion gap: 5 (ref 5–15)
BUN: 18 mg/dL (ref 6–20)
CO2: 28 mmol/L (ref 22–32)
Calcium: 8 mg/dL — ABNORMAL LOW (ref 8.9–10.3)
Chloride: 101 mmol/L (ref 101–111)
Creatinine, Ser: 1.28 mg/dL — ABNORMAL HIGH (ref 0.61–1.24)
GFR calc Af Amer: 58 mL/min — ABNORMAL LOW (ref >60)
GFR calc non Af Amer: 50 mL/min — ABNORMAL LOW (ref >60)
Glucose, Bld: 97 mg/dL (ref 65–99)
Potassium: 4 mmol/L (ref 3.5–5.1)
Sodium: 134 mmol/L — ABNORMAL LOW (ref 135–145)

## 2016-03-23 LAB — CBC
HCT: 29.9 % — ABNORMAL LOW (ref 39.0–52.0)
Hemoglobin: 9.9 g/dL — ABNORMAL LOW (ref 13.0–17.0)
MCH: 30.7 pg (ref 26.0–34.0)
MCHC: 33.1 g/dL (ref 30.0–36.0)
MCV: 92.9 fL (ref 78.0–100.0)
Platelets: 98 10*3/uL — ABNORMAL LOW (ref 150–400)
RBC: 3.22 MIL/uL — ABNORMAL LOW (ref 4.22–5.81)
RDW: 14.6 % (ref 11.5–15.5)
WBC: 11.1 10*3/uL — ABNORMAL HIGH (ref 4.0–10.5)

## 2016-03-23 LAB — MAGNESIUM
Magnesium: 2.2 mg/dL (ref 1.7–2.4)
Magnesium: 2.1 mg/dL (ref 1.7–2.4)

## 2016-03-23 LAB — PROTIME-INR
INR: 1.2
Prothrombin Time: 15.3 seconds — ABNORMAL HIGH (ref 11.4–15.2)

## 2016-03-23 LAB — POTASSIUM: Potassium: 3.7 mmol/L (ref 3.5–5.1)

## 2016-03-23 MED ORDER — FENTANYL CITRATE (PF) 100 MCG/2ML IJ SOLN
INTRAMUSCULAR | Status: AC
  Filled 2016-03-23: qty 2

## 2016-03-23 MED ORDER — POTASSIUM CHLORIDE 10 MEQ/50ML IV SOLN
10.0000 meq | INTRAVENOUS | Status: AC
  Administered 2016-03-23 (×3): 10 meq via INTRAVENOUS
  Filled 2016-03-23 (×3): qty 50

## 2016-03-23 MED ORDER — MEXILETINE HCL 150 MG PO CAPS
150.0000 mg | ORAL_CAPSULE | Freq: Three times a day (TID) | ORAL | Status: DC
  Administered 2016-03-24 – 2016-03-25 (×5): 150 mg via ORAL
  Filled 2016-03-23 (×5): qty 1

## 2016-03-23 MED ORDER — MIDAZOLAM HCL 2 MG/2ML IJ SOLN
2.0000 mg | Freq: Once | INTRAMUSCULAR | Status: AC
  Administered 2016-03-23: 2 mg via INTRAVENOUS

## 2016-03-23 MED ORDER — FENTANYL CITRATE (PF) 100 MCG/2ML IJ SOLN
50.0000 ug | Freq: Once | INTRAMUSCULAR | Status: AC
  Administered 2016-03-23: 50 ug via INTRAVENOUS

## 2016-03-23 MED ORDER — LIDOCAINE HCL (PF) 2 % IJ SOLN: 100.0000 mg | Freq: Once | INTRAMUSCULAR | Status: AC

## 2016-03-23 MED ORDER — FUROSEMIDE 10 MG/ML IJ SOLN
40.0000 mg | Freq: Two times a day (BID) | INTRAMUSCULAR | Status: DC
  Administered 2016-03-23 (×2): 40 mg via INTRAVENOUS
  Filled 2016-03-23 (×2): qty 4

## 2016-03-23 MED ORDER — LIDOCAINE HCL (CARDIAC) 20 MG/ML IV SOLN
INTRAVENOUS | Status: AC
  Administered 2016-03-23: 100 mg
  Filled 2016-03-23: qty 5

## 2016-03-23 MED ORDER — MIDAZOLAM HCL 2 MG/2ML IJ SOLN
INTRAMUSCULAR | Status: AC
  Filled 2016-03-23: qty 2

## 2016-03-23 MED ORDER — FLUMAZENIL 0.5 MG/5ML IV SOLN
0.1000 mg | Freq: Once | INTRAVENOUS | Status: AC
Start: 2016-03-23 — End: 2016-03-23
  Administered 2016-03-23: 0.1 mg via INTRAVENOUS

## 2016-03-23 MED ORDER — LIDOCAINE IN D5W 4-5 MG/ML-% IV SOLN
2.0000 mg/min | INTRAVENOUS | Status: DC
  Administered 2016-03-23: 2 mg/min via INTRAVENOUS
  Filled 2016-03-23 (×2): qty 500

## 2016-03-23 MED ORDER — POTASSIUM CHLORIDE CRYS ER 20 MEQ PO TBCR
20.0000 meq | EXTENDED_RELEASE_TABLET | Freq: Two times a day (BID) | ORAL | Status: DC
  Administered 2016-03-23 – 2016-03-29 (×13): 20 meq via ORAL
  Filled 2016-03-23 (×13): qty 1

## 2016-03-23 MED ORDER — FLUMAZENIL 0.5 MG/5ML IV SOLN
INTRAVENOUS | Status: AC
  Filled 2016-03-23: qty 5

## 2016-03-23 MED ORDER — LIDOCAINE IN D5W 4-5 MG/ML-% IV SOLN
2.0000 mg/min | INTRAVENOUS | Status: DC
  Filled 2016-03-23: qty 500

## 2016-03-23 MED ORDER — ADENOSINE 6 MG/2ML IV SOLN
6.0000 mg | Freq: Once | INTRAVENOUS | Status: AC
  Administered 2016-03-23: 6 mg via INTRAVENOUS

## 2016-03-23 NOTE — Progress Notes (Signed)
Cards paged. Pt developed increased HR 150s-160s. SVT verse VT as he did the previous day. bp was maintaining at rate. Pt was lightheaded. No return page from cards. Dr Gala Romney was on the unit and ask to come assist with HR. Dr Gala Romney gave orders. 6mg  Adenosine pushed with not conversion. Then sedation 2mg  versed and fent given and placed on 100%NRB mask. Pads were already on the patient from previous cardioversion. Once pt was at desired sedation goal pt was shocked 1 time with 200J. Pt conversed to SR in the 70s but only for a short period of time and then went back into tachy rhythm of 150s. Order given to push 100mg  lidocaine IV. Once pushed pt instantly convered to SR in the 70s. Orders given to start lidocaine drip at 2mg . Pt was very sleepy after conversion so order give to push 0.1mg  flumazenil. Pt became more alert and easy to arouse. Cards returned paged at 1630 and was informed that Dr Gala Romney was rounding on the floor and assisted with the emergent situation.  Will cont to monitor and assess pt.

## 2016-03-23 NOTE — Progress Notes (Addendum)
While pt sitting on BSC pt went back in VT in 150s. Pt was then placed back in the bed, Rounding CVTS Dr Cornelius Moras made aware. Ordered 100mg  lidocaine push to be given and check potassium and mag level. Once given pt converted back to SR. Dr Cornelius Moras wanted Cards MD to be made aware so Dr Ladona Ridgel could be told.  Dr Charlestine Night returned page and informed him of situation and that 100mg  lidocaine push was given and the pt was still on 2mg /min lidocaine drip. Bp stable during event. Will cont to monitor and assess

## 2016-03-23 NOTE — Progress Notes (Signed)
  I was in the SICU when patient developed recurrent WCT at 160. Became lightheaded. Not feeling well.   Given adenosine 6mg  with no effect on rhythm. Continued to feel worse.   Decision made to emergently cardiovert. Given 2mg  versed and 50 mg fentanyl and DC-CV with synchronized 200J shock. Brief conversion to NSR (several beats) but then back to VT.   Given 100mg  lidocaine with immediate conversion to NSR. Discussed with Dr. Ladona Ridgel by phone. Will start lido gtt at 2. Consider switch to mexilitene soon.  Post DC-CV patient somnolent but maintaining his sats. Given 0.1mg  flumazenil.  Patient remained stable.   Critical Care Time devoted to patient care services described in this note is 40 Minutes not including cardioversion time.   Roy Boschert,MD 4:21 PM  .

## 2016-03-23 NOTE — CV Procedure (Signed)
  Procedure: DC-CV  Indication: Hemodynamically-significant VT  I was in the SICU when patient developed recurrent WCT at 160. Became lightheaded. Not feeling well.   Given adenosine 6mg  with no effect on rhythm. Continued to feel worse.   Decision made to emergently cardiovert. Pads already in place in AP position. Given 2mg  versed and 50 mg fentanyl and underwent DC-CV with synchronized 200J shock. Brief conversion to NSR (several beats) but then back to VT.   Lidocaine started with conversion to NSR.   Post-procedure given flumazenil 0.1mg  to partially reverse sedation.   Bensimhon, Daniel,MD 4:22 PM

## 2016-03-23 NOTE — Progress Notes (Signed)
TCTS BRIEF SICU PROGRESS NOTE  4 Days Post-Op  S/P Procedure(s) (LRB): MINIMALLY INVASIVE MITRAL VALVE REPAIR (MVR) (Right) TRANSESOPHAGEAL ECHOCARDIOGRAM (TEE) (N/A)   Patient currently looks and feels fine but suffered another episode of sustained WCT c/w VT requiring DCCV Went back into VT after DCCV but then converted to NSR on lidocaine drip  Plan: Per EPS.  Continue lidocaine drip.  Keep in ICU.  Watch potassium, magnesium levels.  [name], MD 03/23/2016 6:34 PM

## 2016-03-23 NOTE — Progress Notes (Signed)
TCTS BRIEF SICU PROGRESS NOTE  4 Days Post-Op  S/P Procedure(s) (LRB): MINIMALLY INVASIVE MITRAL VALVE REPAIR (MVR) (Right) TRANSESOPHAGEAL ECHOCARDIOGRAM (TEE) (N/A)   Patient just had another episode VT that broke after another IV bolus of lidocaine was given  Plan: Continue IV lidocaine drip.  Check serum potassium and magnesium levels and supplement aggressively as needed.  [name], MD 03/23/2016 7:06 PM

## 2016-03-23 NOTE — Progress Notes (Signed)
[address]       [name] [address]             [phone]        CARDIOTHORACIC SURGERY PROGRESS NOTE   R4 Days Post-Op Procedure(s) (LRB): MINIMALLY INVASIVE MITRAL VALVE REPAIR (MVR) (Right) TRANSESOPHAGEAL ECHOCARDIOGRAM (TEE) (N/A)  Subjective: Breathing well.  No pain.  Eating breakfast.  Still having visual hallucinations which began after amiodarone was started yesterday  Objective: Vital signs: BP Readings from Last 1 Encounters:  03/23/16 128/65   Pulse Readings from Last 1 Encounters:  03/23/16 64   Resp Readings from Last 1 Encounters:  03/23/16 (!) 21   Temp Readings from Last 1 Encounters:  03/23/16 97.5 F (36.4 C) (Oral)    Hemodynamics:    Physical Exam:  Rhythm:   sinus  Breath sounds: clear  Heart sounds:  RRR w/out murmur  Incisions:  Clean and dry  Abdomen:  Soft, non-distended, non-tender  Extremities:  Warm, well-perfused  Chest tubes:  Decreasing but significant volume thin serosanguinous output, no air leak    Intake/Output from previous day: 10/20 0701 - 10/21 0700 In: 1159.4 [P.O.:480; I.V.:679.4] Out: 1755 [Urine:1285; Chest Tube:470] Intake/Output this shift: No intake/output data recorded.  Lab Results:  CBC: Recent Labs  03/22/16 0402 03/23/16 0512  WBC 12.9* 11.1*  HGB 10.4* 9.9*  HCT 31.0* 29.9*  PLT 71* 98*    BMET:  Recent Labs  03/22/16 0402 03/23/16 0512  NA 134* 134*  K 4.1 4.0  CL 104 101  CO2 26 28  GLUCOSE 89 97  BUN 18 18  CREATININE 1.32* 1.28*  CALCIUM 7.9* 8.0*     PT/INR:   Recent Labs  03/23/16 0512  LABPROT 15.3*  INR 1.20    CBG (last 3)   Recent Labs  03/21/16 0710 03/21/16 1124 03/21/16 1621  GLUCAP 119* 155* 153*    ABG    Component Value Date/Time   PHART 7.416 03/19/2016 2153   PCO2ART 29.9 (L) 03/19/2016 2153   PO2ART 108.0 03/19/2016 2153   HCO3 19.3 (L) 03/19/2016 2153   TCO2 23 03/20/2016 1808   ACIDBASEDEF 4.0 (H) 03/19/2016  2153   O2SAT 98.0 03/19/2016 2153    CXR: PORTABLE CHEST 1 VIEW  COMPARISON:  Chest x-ray dated 03/21/2016.  FINDINGS: Heart size and mediastinal contours are stable. Pacer pad overlies the left heart border. Mild central pulmonary vascular congestion and interstitial edema without overt alveolar pulmonary edema. No pleural effusion or pneumothorax seen.  IMPRESSION: Cardiomegaly with central pulmonary vascular congestion mild interstitial edema suggesting mild CHF/volume overload. Perhaps mild additional atelectasis at the left lung base, but not convincing. No evidence of pneumonia.   Electronically Signed   By: Bary Richard M.D.   On: 03/22/2016 12:00    Transthoracic Echocardiography  Patient:    Roy Palmer, Roy Palmer MR #:       [id number] Study Date: 03/22/2016 Gender:     M Age:        82 Height:     180.3 cm Weight:     90.7 kg BSA:        2.15 m^2 Pt. Status: Room:       2S16C   ATTENDING    Tressie Stalker, M.D.  REFERRING    Tressie Stalker, M.D.  ORDERING     VanTrigt, Arn Medal    VanTrigt, Genelle Gather    Bryan Lemma, MD  SONOGRAPHER  [facility]  PERFORMING   [facility], Inpatient  cc:  ------------------------------------------------------------------- LV EF: 40% -   45%  ------------------------------------------------------------------- Indications:      Pericardial disease - tamponnade 423.0, suspected, pre-procedure.  ------------------------------------------------------------------- History:   PMH:  Cardiac arrest S/P mitral valve repair. CKD III. NSVT. Thrombocytopenia.  ------------------------------------------------------------------- Study Conclusions  - Left ventricle: The cavity size was normal. There was moderate   concentric hypertrophy. Systolic function was mildly to   moderately reduced. The estimated ejection fraction was in the   range of 40% to 45%. Possible inferior hypokinesis and   incoordinate  septal motion. - Mitral valve: Annuloplasty ring in place. Trivial regurgitation   without significant stenosis. - Left atrium: The atrium was mildly dilated. - Right ventricle: Normal size with moderately reduced RV systolic   function. - Right atrium: The atrium was normal in size. - Pulmonary arteries: PA peak pressure: 41 mm Hg (S). - Systemic veins: The IVC measures <2.1 cm but does not collapse   >50%, suggesting an elevated RA pressure of 8 mmHg. - Pericardium, extracardiac: There was no pericardial effusion.   Features were not consistent with tamponade physiology.  Impressions:  - Compared to a prior study in 02/2016, the LVEF is lower at 40-45%   with possible inferior wall hypokinesis and incoordinate septal   motion. There is no pericardial effusion or concern for   tamponade, however, there is likely moderate RV dysfunction and   mild pulmonary hypertension with an RVSP of 41 mmHg.  ------------------------------------------------------------------- Study data:  Comparison was made to the study of 02/23/2016.  Study status:  STAT.  Procedure:  The patient reported no pain pre or post test. Transthoracic echocardiography. Image quality was adequate.          Transthoracic echocardiography.  M-mode, limited 2D, limited spectral Doppler, and color Doppler.  Birthdate: Patient birthdate: March 24, 1930.  Age:  Patient is 82 yr old.  Sex: Gender: male.    BMI: 27.9 kg/m^2.  Blood pressure:     124/69 Patient status:  Inpatient.  Study date:  Study date: 03/22/2016. Study time: 12:01 PM.  Location:  ICU/CCU  -------------------------------------------------------------------  ------------------------------------------------------------------- Left ventricle:  The cavity size was normal. There was moderate concentric hypertrophy. Systolic function was mildly to moderately reduced. The estimated ejection fraction was in the range of 40% to 45%. Possible inferior hypokinesis  and incoordinate septal motion.   ------------------------------------------------------------------- Aorta:  Aortic root: The aortic root was normal in size. Ascending aorta: The ascending aorta was normal in size.  ------------------------------------------------------------------- Mitral valve:  Annuloplasty ring in place. Trivial regurgitation without significant stenosis.  Doppler:     Mean gradient (D): 3 mm Hg.  ------------------------------------------------------------------- Left atrium:  The atrium was mildly dilated.  ------------------------------------------------------------------- Right ventricle:  Normal size with moderately reduced RV systolic function.  ------------------------------------------------------------------- Tricuspid valve:   Doppler:  There was mild regurgitation.  ------------------------------------------------------------------- Pulmonary artery:   The main pulmonary artery was normal-sized.  ------------------------------------------------------------------- Right atrium:  The atrium was normal in size.  ------------------------------------------------------------------- Pericardium:  There was no pericardial effusion.  Doppler: Features were not consistent with tamponade physiology.  ------------------------------------------------------------------- Systemic veins:  The IVC measures <2.1 cm but does not collapse >50%, suggesting an elevated RA pressure of 8 mmHg.  ------------------------------------------------------------------- Measurements   Left ventricle                              Value        Reference  LV ID, ED, PLAX chordal             (H)     56.4  mm     43 - 52  LV ID, ES, PLAX chordal             (H)     45.6  mm     23 - 38  LV fx shortening, PLAX chordal      (L)     19    %      >=29  LV PW thickness, ED                         13.4  mm     ---------  IVS/LV PW ratio, ED                         1.03          <=1.3  LV end-diastolic volume, 1-p A2C            130   ml     ---------  LV end-systolic volume, 1-p A2C             67    ml     ---------  LV end-diastolic volume, 1-p A4C            197   ml     ---------  LV end-systolic volume, 1-p A4C             121   ml     ---------  LV ejection fraction, 1-p A4C               39    %      ---------  Stroke volume, 1-p A4C                      76    ml     ---------  LV end-diastolic volume/bsa, 1-p            92    ml/m^2 ---------  A4C  LV end-systolic volume/bsa, 1-p A4C         56    ml/m^2 ---------  Stroke volume/bsa, 1-p A4C                  35    ml/m^2 ---------  LV end-diastolic volume, 2-p                177   ml     ---------  LV end-systolic volume, 2-p                 100   ml     ---------  LV ejection fraction, 2-p                   43    %      ---------  Stroke volume, 2-p                          77    ml     ---------  LV end-diastolic volume/bsa, 2-p            82    ml/m^2 ---------  LV end-systolic volume/bsa, 2-p             47    ml/m^2 ---------  Stroke volume/bsa, 2-p  35.7  ml/m^2 ---------    Ventricular septum                          Value        Reference  IVS thickness, ED                           13.8  mm     ---------    LVOT                                        Value        Reference  LVOT ID, S                                  23    mm     ---------  LVOT area                                   4.15  cm^2   ---------    Aorta                                       Value        Reference  Aortic root ID, ED                          38    mm     ---------    Left atrium                                 Value        Reference  LA ID, A-P, ES                              41    mm     ---------  LA ID/bsa, A-P                              1.91  cm/m^2 <=2.2    Mitral valve                                Value        Reference  Mitral mean velocity, D                     69.3  cm/s    ---------  Mitral mean gradient, D                     3     mm Hg  ---------  Mitral annulus VTI, D                       48.5  cm     ---------    Pulmonary arteries  Value        Reference  PA pressure, S, DP                  (H)     41    mm Hg  <=30    Tricuspid valve                             Value        Reference  Tricuspid regurg peak velocity              288   cm/s   ---------  Tricuspid peak RV-RA gradient               33    mm Hg  ---------    Systemic veins                              Value        Reference  Estimated CVP                               3     mm Hg  ---------    Right ventricle                             Value        Reference  RV pressure, S, DP                  (H)     36    mm Hg  <=30  Legend: (L)  and  (H)  mark values outside specified reference range.  ------------------------------------------------------------------- Prepared and Electronically Authenticated by  Zoila ShutterKenneth Hilty MD 2017-10-20T12:58:57   Assessment/Plan: S/P Procedure(s) (LRB): MINIMALLY INVASIVE MITRAL VALVE REPAIR (MVR) (Right) TRANSESOPHAGEAL ECHOCARDIOGRAM (TEE) (N/A)  Overall stable POD4 S/P DCCV yesterday for sustained WCT c/w VT vs SVT w/ aberrant conduction Maintaining NSR on low dose metoprolol and IV amiodarone Visual hallucinations and other vague complaints reported ever since amiodarone started Pre-op history of bifascicular block and NSVT  Breathing comfortably w/ O2 sats 93-96% on RA Chronic combined diastolic CHF with expected post-op volume excess, weight still 15 lbs above preop baseline Chest tubes still draining thin serousanguinous fluid Follow up ECHO w/ intact mitral valve repair, slight drop in EF which is not unexpected, and mild pulmonary hypertension Expected post op acute blood loss anemia, mild Expected post op atelectasis, mild Post op thrombocytopenia, platelet count up to 98k this morning CKD stage III,  adequate UOP and stable creatinine   Consider stopping amiodarone - will discuss w/ EP  Continue low dose metoprolol  Increase lasix  Keep chest tubes in place for now  Coumadin  Roy Nailslarence H Rue Valladares, MD 03/23/2016 8:55 AM

## 2016-03-23 NOTE — Progress Notes (Signed)
Patient Name: Roy Palmer Date of Encounter: 03/23/2016     Principal Problem:   S/P minimally invasive mitral valve repair Active Problems:   Severe mitral regurgitation   Near syncope -- cardiogenic   Symptomatic PVCs   Non-sustained ventricular tachycardia (HCC)   Incidental pulmonary nodule   AAA (abdominal aortic aneurysm) without rupture (HCC)   Thoracic aortic aneurysm (HCC)   Vasovagal syncope   Mild CAD   Essential hypertension   CKD (chronic kidney disease), stage III   Hypertensive heart and kidney disease   Hyperlipidemia   SVT (supraventricular tachycardia) (HCC)    SUBJECTIVE  No chest pain or palpitations.   CURRENT MEDS . acetaminophen  1,000 mg Oral Q6H  . aspirin EC  81 mg Oral Daily  . atorvastatin  20 mg Oral q1800  . bisacodyl  10 mg Oral Daily   Or  . bisacodyl  10 mg Rectal Daily  . docusate sodium  200 mg Oral Daily  . furosemide  40 mg Intravenous BID  . metoprolol tartrate  12.5 mg Oral BID  . pantoprazole  40 mg Oral Daily  . potassium chloride  20 mEq Oral BID  . sodium chloride flush  10-40 mL Intracatheter Q12H  . sodium chloride flush  3 mL Intravenous Q12H  . sodium chloride flush  3 mL Intravenous Q12H  . warfarin  2.5 mg Oral q1800  . Warfarin - Physician Dosing Inpatient   Does not apply q1800    OBJECTIVE  Vitals:   03/23/16 0500 03/23/16 0600 03/23/16 0700 03/23/16 0719  BP:  103/66 128/65   Pulse: 65 66 64   Resp: 17 20 (!) 21   Temp:    97.5 F (36.4 C)  TempSrc:    Oral  SpO2:  97% 91%   Weight: 201 lb (91.2 kg)     Height:        Intake/Output Summary (Last 24 hours) at 03/23/16 1014 Last data filed at 03/23/16 0600  Gross per 24 hour  Intake           919.43 ml  Output             1320 ml  Net          -400.57 ml   Filed Weights   03/21/16 0600 03/22/16 0600 03/23/16 0500  Weight: 202 lb 4.8 oz (91.8 kg) 200 lb (90.7 kg) 201 lb (91.2 kg)    PHYSICAL EXAM  General: Pleasant, NAD. Neuro:  Alert and oriented X 3. Moves all extremities spontaneously. Psych: Normal affect. HEENT:  Normal  Neck: Supple without bruits or JVD. Lungs:  Resp regular and unlabored, CTA. Heart: RRR no s3, s4, or murmurs. Abdomen: Soft, non-tender, non-distended, BS + x 4.  Extremities: No clubbing, cyanosis or edema. DP/PT/Radials 2+ and equal bilaterally.  Accessory Clinical Findings  CBC  Recent Labs  03/22/16 0402 03/23/16 0512  WBC 12.9* 11.1*  HGB 10.4* 9.9*  HCT 31.0* 29.9*  MCV 93.7 92.9  PLT 71* 98*   Basic Metabolic Panel  Recent Labs  03/20/16 1810  03/22/16 0402 03/23/16 0512  NA  --   < > 134* 134*  K  --   < > 4.1 4.0  CL  --   < > 104 101  CO2  --   < > 26 28  GLUCOSE  --   < > 89 97  BUN  --   < > 18 18  CREATININE 1.37*  < >  1.32* 1.28*  CALCIUM  --   < > 7.9* 8.0*  MG 2.4  --   --  2.2  < > = values in this interval not displayed. Liver Function Tests No results for input(s): AST, ALT, ALKPHOS, BILITOT, PROT, ALBUMIN in the last 72 hours. No results for input(s): LIPASE, AMYLASE in the last 72 hours. Cardiac Enzymes No results for input(s): CKTOTAL, CKMB, CKMBINDEX, TROPONINI in the last 72 hours. BNP Invalid input(s): POCBNP D-Dimer No results for input(s): DDIMER in the last 72 hours. Hemoglobin A1C No results for input(s): HGBA1C in the last 72 hours. Fasting Lipid Panel No results for input(s): CHOL, HDL, LDLCALC, TRIG, CHOLHDL, LDLDIRECT in the last 72 hours. Thyroid Function Tests No results for input(s): TSH, T4TOTAL, T3FREE, THYROIDAB in the last 72 hours.  Invalid input(s): FREET3  TELE  nsr with occaisional PVC's  Radiology/Studies  Dg Chest 2 View  Result Date: 03/15/2016 CLINICAL DATA:  CHF. Preoperative examination for mitral valve replacement. History of thoracic aortic aneurysm. EXAM: CHEST  2 VIEW COMPARISON:  Chest x-ray of February 29 2016 FINDINGS: The lungs are adequately inflated and clear. The heart and pulmonary  vascularity are normal. There is faint calcification in the wall of the aortic arch. There is mild tortuosity of the descending thoracic aorta. There is no pleural effusion. The bony thorax exhibits no acute abnormality. IMPRESSION: There is no active cardiopulmonary disease. Aortic atherosclerosis. Electronically Signed   By: David  [place] M.D.   On: 03/15/2016 07:42   Dg Chest 2 View  Result Date: 02/29/2016 CLINICAL DATA:  Dizziness for 2 weeks, hypotension, recent cardiac catheterization EXAM: CHEST  2 VIEW COMPARISON:  Chest x-ray of 02/20/2016 FINDINGS: No active infiltrate or effusion is seen. Mediastinal and hilar contours are unremarkable. Mild cardiomegaly is stable. No acute bony abnormality is seen. IMPRESSION: No active cardiopulmonary disease. Electronically Signed   By: Dwyane Dee M.D.   On: 02/29/2016 11:47   Dg Chest Port 1 View  Result Date: 03/22/2016 CLINICAL DATA:  Complications after pacing wires removal, at risk for respiratory distress. Central line has been placed. Pt states he has no chest complaints at this time. EXAM: PORTABLE CHEST 1 VIEW COMPARISON:  Chest x-ray dated 03/21/2016. FINDINGS: Heart size and mediastinal contours are stable. Pacer pad overlies the left heart border. Mild central pulmonary vascular congestion and interstitial edema without overt alveolar pulmonary edema. No pleural effusion or pneumothorax seen. IMPRESSION: Cardiomegaly with central pulmonary vascular congestion mild interstitial edema suggesting mild CHF/volume overload. Perhaps mild additional atelectasis at the left lung base, but not convincing. No evidence of pneumonia. Electronically Signed   By: Bary Richard M.D.   On: 03/22/2016 12:00   Dg Chest Port 1 View  Result Date: 03/21/2016 CLINICAL DATA:  Atelectasis and shortness of breath EXAM: PORTABLE CHEST 1 VIEW COMPARISON:  03/20/16 FINDINGS: Cardiac shadow is enlarged but stable. Postoperative changes are again seen. The previously  noted Swan-Ganz catheter has been removed. Right-sided thoracostomy catheter remains in place. No pneumothorax is seen. No significant infiltrate is noted. A pericardial drain remains in place. Left jugular central venous sheath is again noted and kinked at skin surface. Minimal right basilar atelectasis is noted. IMPRESSION: Postsurgical changes with tubes and lines as described above. Minimal right basilar atelectasis remains. Electronically Signed   By: Alcide Clever M.D.   On: 03/21/2016 08:00   Dg Chest Port 1 View  Result Date: 03/20/2016 CLINICAL DATA:  Chest tube.  Post CABG EXAM: PORTABLE CHEST  1 VIEW COMPARISON:  03/19/2016 FINDINGS: Endotracheal tube removed. Swan-Ganz catheter remains in the main pulmonary artery unchanged. Left atrial clip unchanged in position. Cardiac valve replacement. Right chest tube in place. No pneumothorax. No pleural effusion on the right. Bibasilar atelectasis with mild progression. Negative for heart failure or edema. IMPRESSION: Increased bibasilar atelectasis and decreased lung volume following extubation. Negative for pneumothorax.  Right chest tube remains in place. Electronically Signed   By: Marlan Palau M.D.   On: 03/20/2016 09:16   Dg Chest Port 1 View  Result Date: 03/19/2016 CLINICAL DATA:  82 year old male status post minimally invasive mitral valve repair. Initial encounter. EXAM: PORTABLE CHEST 1 VIEW COMPARISON:  03/15/2016 and earlier. FINDINGS: Portable AP semi upright view at 1443 hours. Endotracheal tube tip about 15 mm above the carina. Enteric tube courses to the abdomen, tip not included. Left IJ approach Swan-Ganz catheter, tip at the level of the main pulmonary outflow tract. Right side chest tube coursing to the right lung apex. A second right side approach tube courses along the level of the diaphragm terminating over the left heart border. Percutaneous type pacer wires. Prosthetic valve material projects over the central cardiac  silhouette. Mediastinal contours appear stable. Lower lung volumes. Perihilar and mild basilar crowding of lung markings. No pneumothorax identified. No pulmonary edema. IMPRESSION: 1. Lines and tubes appear appropriately placed: Endotracheal tube tip about 15 mm above the carina. Left IJ approach Swan-Ganz catheter tip at the level of the main pulmonary outflow tract. 2. Lower lung volumes with atelectasis. No pneumothorax or pulmonary edema identified. Electronically Signed   By: Odessa Fleming M.D.   On: 03/19/2016 14:52   Ct Angio Chest Aorta W &/or Wo Contrast  Result Date: 03/12/2016 CLINICAL DATA:  Thoracic aortic aneurysm. EXAM: CT ANGIOGRAPHY CHEST, ABDOMEN AND PELVIS TECHNIQUE: Multidetector CT imaging through the chest, abdomen and pelvis was performed using the standard protocol during bolus administration of intravenous contrast. Multiplanar reconstructed images and MIPs were obtained and reviewed to evaluate the vascular anatomy. CONTRAST:  100 mL Isovue 370 COMPARISON:  None. FINDINGS: CTA CHEST FINDINGS Cardiovascular: Preferential opacification of the thoracic aorta. No evidence of thoracic aortic dissection. Distal aortic arch measures 3.5 cm in diameter. Ascending thoracic aorta measures 3.5 cm in AP dimension at the level of the right main pulmonary artery. Descending thoracic aortic aneurysm measuring 3.1 cm in AP diameter at the level of the right main pulmonary artery. Mild thoracic aortic atherosclerosis. Mild coronary artery atherosclerosis in the LAD. Enlarged heart size. No pericardial effusion. Mediastinum/Nodes: No enlarged mediastinal, hilar, or axillary lymph nodes. Thyroid gland, trachea, and esophagus demonstrate no significant findings. Lungs/Pleura: 2.2 x 2.7 cm focal area of airspace disease at the right lung base which may reflect pneumonia versus atelectasis versus less likely neoplasm. No pleural effusion or pneumothorax. Musculoskeletal: No acute osseous abnormality. No lytic or  sclerotic osseous lesion. Review of the MIP images confirms the above findings. CTA ABDOMEN AND PELVIS FINDINGS VASCULAR Aorta: Abdominal aortic atherosclerosis. Infrarenal abdominal aortic aneurysm aneurysm measuring 3.5 (AP) x 3.3 (transverse). Aneurysm measures 5.2 cm in length. The neck of the aneurysm is approximately 2.2 cm from the right renal artery origin. Right common iliac artery measures 1.7 cm in diameter. Left common iliac artery measures 1.4 cm in diameter. Mild atherosclerotic plaque in bilateral common iliac arteries. Mild atherosclerotic plaque in bilateral external iliac arteries. Celiac: Patent. SMA: Patent. Renals: Patent right renal artery with minimal atherosclerotic plaque at the origin. Patent left renal artery  with mild atherosclerotic plaque resulting in less than 50% focal stenosis. IMA: Patent with mild atherosclerotic plaque at the origin. Inflow: Patent without evidence of dissection, vasculitis or significant stenosis. Veins: No obvious venous abnormality within the limitations of this arterial phase study. Review of the MIP images confirms the above findings. NON-VASCULAR Hepatobiliary: No focal liver abnormality is seen. No gallstones, gallbladder wall thickening, or biliary dilatation. Pancreas: Unremarkable. No pancreatic ductal dilatation or surrounding inflammatory changes. Spleen: Normal in size without focal abnormality. Adrenals/Urinary Tract: Adrenal glands are unremarkable. Kidneys are normal, without renal calculi, focal lesion, or hydronephrosis. Bladder is unremarkable. Stomach/Bowel: Stomach is within normal limits. Appendix appears normal. No evidence of bowel wall thickening, distention, or inflammatory changes. Diverticulosis without evidence of diverticulitis. Lymphatic: No lymphadenopathy. Reproductive: Prostate is unremarkable. Other: No abdominal wall hernia or abnormality. No abdominopelvic ascites. Musculoskeletal: No acute osseous abnormality. No aggressive  lytic or sclerotic osseous lesion. Osteoarthritis of bilateral sacroiliac joints. Degenerative disc disease with disc height loss at L5-S1 with bilateral facet arthropathy. Review of the MIP images confirms the above findings. IMPRESSION: 1. Distal aortic arch measures 3.5 cm in diameter. Recommend annual imaging followup by CTA or MRA. This recommendation follows 2010 ACCF/AHA/AATS/ACR/ASA/SCA/SCAI/SIR/STS/SVM Guidelines for the Diagnosis and Management of Patients with Thoracic Aortic Disease. Circulation.2010; 121: U440-H474 2. Infrarenal abdominal aortic aneurysm measuring 3.5 x 3.3 cm. Recommend followup by ultrasound in 2 years. This recommendation follows ACR consensus guidelines: White Paper of the ACR Incidental Findings Committee II on Vascular Findings. J Am Coll Radiol 2013; 10:789-794. 3. Diverticulosis without evidence of diverticulitis. 4. 2.2 x 2.7 cm focal area of airspace disease at the right lung base which may reflect pneumonia versus atelectasis versus less likely neoplasm. Correlate with laboratory values. Followup CT chest is recommended in 4-6 weeks following trial of antibiotic therapy to ensure resolution and exclude underlying malignancy. 5.  Aortic Atherosclerosis (ICD10-170.0) Electronically Signed   By: Elige Ko   On: 03/12/2016 15:48   Ct Angio Abd/pel W/ And/or W/o  Result Date: 03/12/2016 CLINICAL DATA:  Thoracic aortic aneurysm. EXAM: CT ANGIOGRAPHY CHEST, ABDOMEN AND PELVIS TECHNIQUE: Multidetector CT imaging through the chest, abdomen and pelvis was performed using the standard protocol during bolus administration of intravenous contrast. Multiplanar reconstructed images and MIPs were obtained and reviewed to evaluate the vascular anatomy. CONTRAST:  100 mL Isovue 370 COMPARISON:  None. FINDINGS: CTA CHEST FINDINGS Cardiovascular: Preferential opacification of the thoracic aorta. No evidence of thoracic aortic dissection. Distal aortic arch measures 3.5 cm in diameter.  Ascending thoracic aorta measures 3.5 cm in AP dimension at the level of the right main pulmonary artery. Descending thoracic aortic aneurysm measuring 3.1 cm in AP diameter at the level of the right main pulmonary artery. Mild thoracic aortic atherosclerosis. Mild coronary artery atherosclerosis in the LAD. Enlarged heart size. No pericardial effusion. Mediastinum/Nodes: No enlarged mediastinal, hilar, or axillary lymph nodes. Thyroid gland, trachea, and esophagus demonstrate no significant findings. Lungs/Pleura: 2.2 x 2.7 cm focal area of airspace disease at the right lung base which may reflect pneumonia versus atelectasis versus less likely neoplasm. No pleural effusion or pneumothorax. Musculoskeletal: No acute osseous abnormality. No lytic or sclerotic osseous lesion. Review of the MIP images confirms the above findings. CTA ABDOMEN AND PELVIS FINDINGS VASCULAR Aorta: Abdominal aortic atherosclerosis. Infrarenal abdominal aortic aneurysm aneurysm measuring 3.5 (AP) x 3.3 (transverse). Aneurysm measures 5.2 cm in length. The neck of the aneurysm is approximately 2.2 cm from the right renal artery origin. Right common iliac  artery measures 1.7 cm in diameter. Left common iliac artery measures 1.4 cm in diameter. Mild atherosclerotic plaque in bilateral common iliac arteries. Mild atherosclerotic plaque in bilateral external iliac arteries. Celiac: Patent. SMA: Patent. Renals: Patent right renal artery with minimal atherosclerotic plaque at the origin. Patent left renal artery with mild atherosclerotic plaque resulting in less than 50% focal stenosis. IMA: Patent with mild atherosclerotic plaque at the origin. Inflow: Patent without evidence of dissection, vasculitis or significant stenosis. Veins: No obvious venous abnormality within the limitations of this arterial phase study. Review of the MIP images confirms the above findings. NON-VASCULAR Hepatobiliary: No focal liver abnormality is seen. No gallstones,  gallbladder wall thickening, or biliary dilatation. Pancreas: Unremarkable. No pancreatic ductal dilatation or surrounding inflammatory changes. Spleen: Normal in size without focal abnormality. Adrenals/Urinary Tract: Adrenal glands are unremarkable. Kidneys are normal, without renal calculi, focal lesion, or hydronephrosis. Bladder is unremarkable. Stomach/Bowel: Stomach is within normal limits. Appendix appears normal. No evidence of bowel wall thickening, distention, or inflammatory changes. Diverticulosis without evidence of diverticulitis. Lymphatic: No lymphadenopathy. Reproductive: Prostate is unremarkable. Other: No abdominal wall hernia or abnormality. No abdominopelvic ascites. Musculoskeletal: No acute osseous abnormality. No aggressive lytic or sclerotic osseous lesion. Osteoarthritis of bilateral sacroiliac joints. Degenerative disc disease with disc height loss at L5-S1 with bilateral facet arthropathy. Review of the MIP images confirms the above findings. IMPRESSION: 1. Distal aortic arch measures 3.5 cm in diameter. Recommend annual imaging followup by CTA or MRA. This recommendation follows 2010 ACCF/AHA/AATS/ACR/ASA/SCA/SCAI/SIR/STS/SVM Guidelines for the Diagnosis and Management of Patients with Thoracic Aortic Disease. Circulation.2010; 121: W098-J191 2. Infrarenal abdominal aortic aneurysm measuring 3.5 x 3.3 cm. Recommend followup by ultrasound in 2 years. This recommendation follows ACR consensus guidelines: White Paper of the ACR Incidental Findings Committee II on Vascular Findings. J Am Coll Radiol 2013; 10:789-794. 3. Diverticulosis without evidence of diverticulitis. 4. 2.2 x 2.7 cm focal area of airspace disease at the right lung base which may reflect pneumonia versus atelectasis versus less likely neoplasm. Correlate with laboratory values. Followup CT chest is recommended in 4-6 weeks following trial of antibiotic therapy to ensure resolution and exclude underlying malignancy. 5.   Aortic Atherosclerosis (ICD10-170.0) Electronically Signed   By: Elige Ko   On: 03/12/2016 15:48    ASSESSMENT AND PLAN  1. VT/Wide QRS tachycardia - I discussed with Dr. Cornelius Moras. He has had no more VT on amio but he has had some visual hallucinations and cannot sleep. He had some of this when he was treated with oral amio preoperatively. DC amio. Continue beta blocker, might think about sotalol. Would hold off on this for now. Would also consider EP study next week. While his wide QRS tachy is most likely VT, review of the ECG's demontrates 1:1 VA conduction which is not often seen in VT. We typically try to avoid an ICD in 80 yo's and the slow VT is almost never a life ending arrhythmia. Will plan to follow with you.   Sharlot Gowda Keirstan Iannello,M.D.  03/23/2016 10:14 AMPatient ID: Roy Palmer, male   DOB: January 26, 1930, 82 y.o.   MRN: [id number]

## 2016-03-24 LAB — PROTIME-INR
INR: 1.22
Prothrombin Time: 15.5 seconds — ABNORMAL HIGH (ref 11.4–15.2)

## 2016-03-24 LAB — BASIC METABOLIC PANEL
Anion gap: 7 (ref 5–15)
BUN: 25 mg/dL — ABNORMAL HIGH (ref 6–20)
CO2: 28 mmol/L (ref 22–32)
Calcium: 7.9 mg/dL — ABNORMAL LOW (ref 8.9–10.3)
Chloride: 100 mmol/L — ABNORMAL LOW (ref 101–111)
Creatinine, Ser: 1.48 mg/dL — ABNORMAL HIGH (ref 0.61–1.24)
GFR calc Af Amer: 48 mL/min — ABNORMAL LOW (ref >60)
GFR calc non Af Amer: 42 mL/min — ABNORMAL LOW (ref >60)
Glucose, Bld: 98 mg/dL (ref 65–99)
Potassium: 4.2 mmol/L (ref 3.5–5.1)
Sodium: 135 mmol/L (ref 135–145)

## 2016-03-24 LAB — MAGNESIUM: Magnesium: 3.5 mg/dL — ABNORMAL HIGH (ref 1.7–2.4)

## 2016-03-24 MED ORDER — FUROSEMIDE 40 MG PO TABS
40.0000 mg | ORAL_TABLET | Freq: Every day | ORAL | Status: DC
  Administered 2016-03-25: 40 mg via ORAL
  Filled 2016-03-24: qty 1

## 2016-03-24 MED ORDER — LISINOPRIL 2.5 MG PO TABS
2.5000 mg | ORAL_TABLET | Freq: Every day | ORAL | Status: DC
  Administered 2016-03-24 – 2016-03-26 (×3): 2.5 mg via ORAL
  Filled 2016-03-24 (×5): qty 1

## 2016-03-24 MED ORDER — MAGNESIUM SULFATE 4 GM/100ML IV SOLN
4.0000 g | Freq: Once | INTRAVENOUS | Status: AC
  Administered 2016-03-24: 4 g via INTRAVENOUS
  Filled 2016-03-24: qty 100

## 2016-03-24 MED ORDER — FUROSEMIDE 10 MG/ML IJ SOLN
40.0000 mg | Freq: Once | INTRAMUSCULAR | Status: AC
  Administered 2016-03-24: 40 mg via INTRAVENOUS
  Filled 2016-03-24: qty 4

## 2016-03-24 MED ORDER — DOCUSATE SODIUM 100 MG PO CAPS
200.0000 mg | ORAL_CAPSULE | Freq: Two times a day (BID) | ORAL | Status: DC
  Administered 2016-03-24 – 2016-03-28 (×3): 200 mg via ORAL
  Filled 2016-03-24 (×5): qty 2

## 2016-03-24 MED ORDER — WARFARIN SODIUM 5 MG PO TABS
5.0000 mg | ORAL_TABLET | Freq: Every day | ORAL | Status: DC
  Administered 2016-03-24 – 2016-03-26 (×3): 5 mg via ORAL
  Filled 2016-03-24 (×3): qty 1

## 2016-03-24 MED ORDER — ACETAMINOPHEN 500 MG PO TABS: 1000.0000 mg | ORAL_TABLET | Freq: Four times a day (QID) | ORAL | Status: DC | PRN

## 2016-03-24 NOTE — Progress Notes (Signed)
Patient Name: Roy Palmer Date of Encounter: 03/24/2016     Principal Problem:   S/P minimally invasive mitral valve repair Active Problems:   Severe mitral regurgitation   Near syncope -- cardiogenic   Symptomatic PVCs   VT (ventricular tachycardia) (HCC)   Incidental pulmonary nodule   AAA (abdominal aortic aneurysm) without rupture (HCC)   Thoracic aortic aneurysm (HCC)   Vasovagal syncope   Mild CAD   Essential hypertension   CKD (chronic kidney disease), stage III   Hypertensive heart and kidney disease   Hyperlipidemia   SVT (supraventricular tachycardia) (HCC)    SUBJECTIVE  No complaints this morning. Visual hallucinations resolved.  CURRENT MEDS . acetaminophen  1,000 mg Oral Q6H  . aspirin EC  81 mg Oral Daily  . atorvastatin  20 mg Oral q1800  . bisacodyl  10 mg Oral Daily   Or  . bisacodyl  10 mg Rectal Daily  . docusate sodium  200 mg Oral BID  . furosemide  40 mg Intravenous Once  . [START ON 03/25/2016] furosemide  40 mg Oral Daily  . lisinopril  2.5 mg Oral Daily  . metoprolol tartrate  12.5 mg Oral BID  . mexiletine  150 mg Oral Q8H  . pantoprazole  40 mg Oral Daily  . potassium chloride  20 mEq Oral BID  . sodium chloride flush  10-40 mL Intracatheter Q12H  . sodium chloride flush  3 mL Intravenous Q12H  . sodium chloride flush  3 mL Intravenous Q12H  . warfarin  5 mg Oral q1800  . Warfarin - Physician Dosing Inpatient   Does not apply q1800    OBJECTIVE  Vitals:   03/24/16 0700 03/24/16 0710 03/24/16 0800 03/24/16 0900  BP: 129/66  117/65 136/83  Pulse: 65  66 70  Resp: 14  14 (!) 23  Temp:  98.7 F (37.1 C)    TempSrc:  Oral    SpO2: 92%  94% 95%  Weight:      Height:        Intake/Output Summary (Last 24 hours) at 03/24/16 1056 Last data filed at 03/24/16 0900  Gross per 24 hour  Intake           1792.5 ml  Output             3690 ml  Net          -1897.5 ml   Filed Weights   03/22/16 0600 03/23/16 0500 03/24/16  0422  Weight: 200 lb (90.7 kg) 201 lb (91.2 kg) 194 lb 10.7 oz (88.3 kg)    PHYSICAL EXAM  General: Pleasant, elderly man, NAD. Neuro: Alert and oriented X 3. Moves all extremities spontaneously. Psych: Normal affect. HEENT:  Normal  Neck: Supple without bruits or JVD. Lungs:  Resp regular and unlabored, CTA. Heart: RRR no s3, s4, or murmurs. Abdomen: Soft, non-tender, non-distended, BS + x 4.  Extremities: No clubbing, cyanosis or edema. DP/PT/Radials 2+ and equal bilaterally.  Accessory Clinical Findings  CBC  Recent Labs  03/22/16 0402 03/23/16 0512  WBC 12.9* 11.1*  HGB 10.4* 9.9*  HCT 31.0* 29.9*  MCV 93.7 92.9  PLT 71* 98*   Basic Metabolic Panel  Recent Labs  03/23/16 0512 03/23/16 1900 03/24/16 0420  NA 134*  --  135  K 4.0 3.7 4.2  CL 101  --  100*  CO2 28  --  28  GLUCOSE 97  --  98  BUN 18  --  25*  CREATININE 1.28*  --  1.48*  CALCIUM 8.0*  --  7.9*  MG 2.2 2.1 3.5*   Liver Function Tests No results for input(s): AST, ALT, ALKPHOS, BILITOT, PROT, ALBUMIN in the last 72 hours. No results for input(s): LIPASE, AMYLASE in the last 72 hours. Cardiac Enzymes No results for input(s): CKTOTAL, CKMB, CKMBINDEX, TROPONINI in the last 72 hours. BNP Invalid input(s): POCBNP D-Dimer No results for input(s): DDIMER in the last 72 hours. Hemoglobin A1C No results for input(s): HGBA1C in the last 72 hours. Fasting Lipid Panel No results for input(s): CHOL, HDL, LDLCALC, TRIG, CHOLHDL, LDLDIRECT in the last 72 hours. Thyroid Function Tests No results for input(s): TSH, T4TOTAL, T3FREE, THYROIDAB in the last 72 hours.  Invalid input(s): FREET3  TELE  NSR with runs of VT, also PVC's  Radiology/Studies  Dg Chest 2 View  Result Date: 03/15/2016 CLINICAL DATA:  CHF. Preoperative examination for mitral valve replacement. History of thoracic aortic aneurysm. EXAM: CHEST  2 VIEW COMPARISON:  Chest x-ray of February 29 2016 FINDINGS: The lungs are  adequately inflated and clear. The heart and pulmonary vascularity are normal. There is faint calcification in the wall of the aortic arch. There is mild tortuosity of the descending thoracic aorta. There is no pleural effusion. The bony thorax exhibits no acute abnormality. IMPRESSION: There is no active cardiopulmonary disease. Aortic atherosclerosis. Electronically Signed   By: David  [place] M.D.   On: 03/15/2016 07:42   Dg Chest 2 View  Result Date: 02/29/2016 CLINICAL DATA:  Dizziness for 2 weeks, hypotension, recent cardiac catheterization EXAM: CHEST  2 VIEW COMPARISON:  Chest x-ray of 02/20/2016 FINDINGS: No active infiltrate or effusion is seen. Mediastinal and hilar contours are unremarkable. Mild cardiomegaly is stable. No acute bony abnormality is seen. IMPRESSION: No active cardiopulmonary disease. Electronically Signed   By: Dwyane Dee M.D.   On: 02/29/2016 11:47   Dg Chest Port 1 View  Result Date: 03/22/2016 CLINICAL DATA:  Complications after pacing wires removal, at risk for respiratory distress. Central line has been placed. Pt states he has no chest complaints at this time. EXAM: PORTABLE CHEST 1 VIEW COMPARISON:  Chest x-ray dated 03/21/2016. FINDINGS: Heart size and mediastinal contours are stable. Pacer pad overlies the left heart border. Mild central pulmonary vascular congestion and interstitial edema without overt alveolar pulmonary edema. No pleural effusion or pneumothorax seen. IMPRESSION: Cardiomegaly with central pulmonary vascular congestion mild interstitial edema suggesting mild CHF/volume overload. Perhaps mild additional atelectasis at the left lung base, but not convincing. No evidence of pneumonia. Electronically Signed   By: Bary Richard M.D.   On: 03/22/2016 12:00   Dg Chest Port 1 View  Result Date: 03/21/2016 CLINICAL DATA:  Atelectasis and shortness of breath EXAM: PORTABLE CHEST 1 VIEW COMPARISON:  03/20/16 FINDINGS: Cardiac shadow is enlarged but stable.  Postoperative changes are again seen. The previously noted Swan-Ganz catheter has been removed. Right-sided thoracostomy catheter remains in place. No pneumothorax is seen. No significant infiltrate is noted. A pericardial drain remains in place. Left jugular central venous sheath is again noted and kinked at skin surface. Minimal right basilar atelectasis is noted. IMPRESSION: Postsurgical changes with tubes and lines as described above. Minimal right basilar atelectasis remains. Electronically Signed   By: Alcide Clever M.D.   On: 03/21/2016 08:00   Dg Chest Port 1 View  Result Date: 03/20/2016 CLINICAL DATA:  Chest tube.  Post CABG EXAM: PORTABLE CHEST 1 VIEW COMPARISON:  03/19/2016 FINDINGS: Endotracheal  tube removed. Swan-Ganz catheter remains in the main pulmonary artery unchanged. Left atrial clip unchanged in position. Cardiac valve replacement. Right chest tube in place. No pneumothorax. No pleural effusion on the right. Bibasilar atelectasis with mild progression. Negative for heart failure or edema. IMPRESSION: Increased bibasilar atelectasis and decreased lung volume following extubation. Negative for pneumothorax.  Right chest tube remains in place. Electronically Signed   By: Marlan Palau M.D.   On: 03/20/2016 09:16   Dg Chest Port 1 View  Result Date: 03/19/2016 CLINICAL DATA:  82 year old male status post minimally invasive mitral valve repair. Initial encounter. EXAM: PORTABLE CHEST 1 VIEW COMPARISON:  03/15/2016 and earlier. FINDINGS: Portable AP semi upright view at 1443 hours. Endotracheal tube tip about 15 mm above the carina. Enteric tube courses to the abdomen, tip not included. Left IJ approach Swan-Ganz catheter, tip at the level of the main pulmonary outflow tract. Right side chest tube coursing to the right lung apex. A second right side approach tube courses along the level of the diaphragm terminating over the left heart border. Percutaneous type pacer wires. Prosthetic valve  material projects over the central cardiac silhouette. Mediastinal contours appear stable. Lower lung volumes. Perihilar and mild basilar crowding of lung markings. No pneumothorax identified. No pulmonary edema. IMPRESSION: 1. Lines and tubes appear appropriately placed: Endotracheal tube tip about 15 mm above the carina. Left IJ approach Swan-Ganz catheter tip at the level of the main pulmonary outflow tract. 2. Lower lung volumes with atelectasis. No pneumothorax or pulmonary edema identified. Electronically Signed   By: Odessa Fleming M.D.   On: 03/19/2016 14:52   Ct Angio Chest Aorta W &/or Wo Contrast  Result Date: 03/12/2016 CLINICAL DATA:  Thoracic aortic aneurysm. EXAM: CT ANGIOGRAPHY CHEST, ABDOMEN AND PELVIS TECHNIQUE: Multidetector CT imaging through the chest, abdomen and pelvis was performed using the standard protocol during bolus administration of intravenous contrast. Multiplanar reconstructed images and MIPs were obtained and reviewed to evaluate the vascular anatomy. CONTRAST:  100 mL Isovue 370 COMPARISON:  None. FINDINGS: CTA CHEST FINDINGS Cardiovascular: Preferential opacification of the thoracic aorta. No evidence of thoracic aortic dissection. Distal aortic arch measures 3.5 cm in diameter. Ascending thoracic aorta measures 3.5 cm in AP dimension at the level of the right main pulmonary artery. Descending thoracic aortic aneurysm measuring 3.1 cm in AP diameter at the level of the right main pulmonary artery. Mild thoracic aortic atherosclerosis. Mild coronary artery atherosclerosis in the LAD. Enlarged heart size. No pericardial effusion. Mediastinum/Nodes: No enlarged mediastinal, hilar, or axillary lymph nodes. Thyroid gland, trachea, and esophagus demonstrate no significant findings. Lungs/Pleura: 2.2 x 2.7 cm focal area of airspace disease at the right lung base which may reflect pneumonia versus atelectasis versus less likely neoplasm. No pleural effusion or pneumothorax.  Musculoskeletal: No acute osseous abnormality. No lytic or sclerotic osseous lesion. Review of the MIP images confirms the above findings. CTA ABDOMEN AND PELVIS FINDINGS VASCULAR Aorta: Abdominal aortic atherosclerosis. Infrarenal abdominal aortic aneurysm aneurysm measuring 3.5 (AP) x 3.3 (transverse). Aneurysm measures 5.2 cm in length. The neck of the aneurysm is approximately 2.2 cm from the right renal artery origin. Right common iliac artery measures 1.7 cm in diameter. Left common iliac artery measures 1.4 cm in diameter. Mild atherosclerotic plaque in bilateral common iliac arteries. Mild atherosclerotic plaque in bilateral external iliac arteries. Celiac: Patent. SMA: Patent. Renals: Patent right renal artery with minimal atherosclerotic plaque at the origin. Patent left renal artery with mild atherosclerotic plaque resulting in less  than 50% focal stenosis. IMA: Patent with mild atherosclerotic plaque at the origin. Inflow: Patent without evidence of dissection, vasculitis or significant stenosis. Veins: No obvious venous abnormality within the limitations of this arterial phase study. Review of the MIP images confirms the above findings. NON-VASCULAR Hepatobiliary: No focal liver abnormality is seen. No gallstones, gallbladder wall thickening, or biliary dilatation. Pancreas: Unremarkable. No pancreatic ductal dilatation or surrounding inflammatory changes. Spleen: Normal in size without focal abnormality. Adrenals/Urinary Tract: Adrenal glands are unremarkable. Kidneys are normal, without renal calculi, focal lesion, or hydronephrosis. Bladder is unremarkable. Stomach/Bowel: Stomach is within normal limits. Appendix appears normal. No evidence of bowel wall thickening, distention, or inflammatory changes. Diverticulosis without evidence of diverticulitis. Lymphatic: No lymphadenopathy. Reproductive: Prostate is unremarkable. Other: No abdominal wall hernia or abnormality. No abdominopelvic ascites.  Musculoskeletal: No acute osseous abnormality. No aggressive lytic or sclerotic osseous lesion. Osteoarthritis of bilateral sacroiliac joints. Degenerative disc disease with disc height loss at L5-S1 with bilateral facet arthropathy. Review of the MIP images confirms the above findings. IMPRESSION: 1. Distal aortic arch measures 3.5 cm in diameter. Recommend annual imaging followup by CTA or MRA. This recommendation follows 2010 ACCF/AHA/AATS/ACR/ASA/SCA/SCAI/SIR/STS/SVM Guidelines for the Diagnosis and Management of Patients with Thoracic Aortic Disease. Circulation.2010; 121: [id number] 2. Infrarenal abdominal aortic aneurysm measuring 3.5 x 3.3 cm. Recommend followup by ultrasound in 2 years. This recommendation follows ACR consensus guidelines: White Paper of the ACR Incidental Findings Committee II on Vascular Findings. J Am Coll Radiol 2013; 10:789-794. 3. Diverticulosis without evidence of diverticulitis. 4. 2.2 x 2.7 cm focal area of airspace disease at the right lung base which may reflect pneumonia versus atelectasis versus less likely neoplasm. Correlate with laboratory values. Followup CT chest is recommended in 4-6 weeks following trial of antibiotic therapy to ensure resolution and exclude underlying malignancy. 5.  Aortic Atherosclerosis (ICD10-170.0) Electronically Signed   By: Elige KoHetal  Patel   On: 03/12/2016 15:48   Ct Angio Abd/pel W/ And/or W/o  Result Date: 03/12/2016 CLINICAL DATA:  Thoracic aortic aneurysm. EXAM: CT ANGIOGRAPHY CHEST, ABDOMEN AND PELVIS TECHNIQUE: Multidetector CT imaging through the chest, abdomen and pelvis was performed using the standard protocol during bolus administration of intravenous contrast. Multiplanar reconstructed images and MIPs were obtained and reviewed to evaluate the vascular anatomy. CONTRAST:  100 mL Isovue 370 COMPARISON:  None. FINDINGS: CTA CHEST FINDINGS Cardiovascular: Preferential opacification of the thoracic aorta. No evidence of thoracic aortic  dissection. Distal aortic arch measures 3.5 cm in diameter. Ascending thoracic aorta measures 3.5 cm in AP dimension at the level of the right main pulmonary artery. Descending thoracic aortic aneurysm measuring 3.1 cm in AP diameter at the level of the right main pulmonary artery. Mild thoracic aortic atherosclerosis. Mild coronary artery atherosclerosis in the LAD. Enlarged heart size. No pericardial effusion. Mediastinum/Nodes: No enlarged mediastinal, hilar, or axillary lymph nodes. Thyroid gland, trachea, and esophagus demonstrate no significant findings. Lungs/Pleura: 2.2 x 2.7 cm focal area of airspace disease at the right lung base which may reflect pneumonia versus atelectasis versus less likely neoplasm. No pleural effusion or pneumothorax. Musculoskeletal: No acute osseous abnormality. No lytic or sclerotic osseous lesion. Review of the MIP images confirms the above findings. CTA ABDOMEN AND PELVIS FINDINGS VASCULAR Aorta: Abdominal aortic atherosclerosis. Infrarenal abdominal aortic aneurysm aneurysm measuring 3.5 (AP) x 3.3 (transverse). Aneurysm measures 5.2 cm in length. The neck of the aneurysm is approximately 2.2 cm from the right renal artery origin. Right common iliac artery measures 1.7 cm in diameter. Left  common iliac artery measures 1.4 cm in diameter. Mild atherosclerotic plaque in bilateral common iliac arteries. Mild atherosclerotic plaque in bilateral external iliac arteries. Celiac: Patent. SMA: Patent. Renals: Patent right renal artery with minimal atherosclerotic plaque at the origin. Patent left renal artery with mild atherosclerotic plaque resulting in less than 50% focal stenosis. IMA: Patent with mild atherosclerotic plaque at the origin. Inflow: Patent without evidence of dissection, vasculitis or significant stenosis. Veins: No obvious venous abnormality within the limitations of this arterial phase study. Review of the MIP images confirms the above findings. NON-VASCULAR  Hepatobiliary: No focal liver abnormality is seen. No gallstones, gallbladder wall thickening, or biliary dilatation. Pancreas: Unremarkable. No pancreatic ductal dilatation or surrounding inflammatory changes. Spleen: Normal in size without focal abnormality. Adrenals/Urinary Tract: Adrenal glands are unremarkable. Kidneys are normal, without renal calculi, focal lesion, or hydronephrosis. Bladder is unremarkable. Stomach/Bowel: Stomach is within normal limits. Appendix appears normal. No evidence of bowel wall thickening, distention, or inflammatory changes. Diverticulosis without evidence of diverticulitis. Lymphatic: No lymphadenopathy. Reproductive: Prostate is unremarkable. Other: No abdominal wall hernia or abnormality. No abdominopelvic ascites. Musculoskeletal: No acute osseous abnormality. No aggressive lytic or sclerotic osseous lesion. Osteoarthritis of bilateral sacroiliac joints. Degenerative disc disease with disc height loss at L5-S1 with bilateral facet arthropathy. Review of the MIP images confirms the above findings. IMPRESSION: 1. Distal aortic arch measures 3.5 cm in diameter. Recommend annual imaging followup by CTA or MRA. This recommendation follows 2010 ACCF/AHA/AATS/ACR/ASA/SCA/SCAI/SIR/STS/SVM Guidelines for the Diagnosis and Management of Patients with Thoracic Aortic Disease. Circulation.2010; 121: Z610-R604 2. Infrarenal abdominal aortic aneurysm measuring 3.5 x 3.3 cm. Recommend followup by ultrasound in 2 years. This recommendation follows ACR consensus guidelines: White Paper of the ACR Incidental Findings Committee II on Vascular Findings. J Am Coll Radiol 2013; 10:789-794. 3. Diverticulosis without evidence of diverticulitis. 4. 2.2 x 2.7 cm focal area of airspace disease at the right lung base which may reflect pneumonia versus atelectasis versus less likely neoplasm. Correlate with laboratory values. Followup CT chest is recommended in 4-6 weeks following trial of antibiotic  therapy to ensure resolution and exclude underlying malignancy. 5.  Aortic Atherosclerosis (ICD10-170.0) Electronically Signed   By: Elige Ko   On: 03/12/2016 15:48    ASSESSMENT AND PLAN 1. VT - he has quieted down with IV lidocaine and now mexitil. I would continue lidocaine today, and stop in the morning. As to the dose of mexitil, might switch to 300 bid as it is hard to take any medication tid. 2. Visual hallucinations - these have resolved with cessation of amiodarone. 3. LV dysfunction - he will likely undergo ICD implant for recurrent VT  Gregg Taylor,M.D.  03/24/2016 10:56 AMPatient ID: Roy Palmer, male   DOB: 10/28/1929, 82 y.o.   MRN: [id number]

## 2016-03-24 NOTE — Progress Notes (Signed)
Patient went back into WCT on Lido gtt @ 1.  Will give 100mg  Bolus IV Lido and continue gtt.

## 2016-03-24 NOTE — Significant Event (Signed)
Cardiology Cross Cover Note  See nursing notes-- called re patient with another episode of WCT, converted with lidocaine 100 mg bolus.  Remains on drip @1 .  Potassium slightly low, being repleted.  EP, Cardiology to continue to follow along.  No sustained WCT for ~6 hrs.   Tammy Sours Means, MD 03/24/2016, 1:48 AM

## 2016-03-24 NOTE — Progress Notes (Addendum)
TCTS BRIEF SICU PROGRESS NOTE  5 Days Post-Op  S/P Procedure(s) (LRB): MINIMALLY INVASIVE MITRAL VALVE REPAIR (MVR) (Right) TRANSESOPHAGEAL ECHOCARDIOGRAM (TEE) (N/A)   Excellent day No more episodes VT Ambulated around SICU Chest tubes out + BM  Plan: As outlined by Dr. Ladona Ridgel.  Stop lidocaine in am.  ICD prior to hospital D/C.  Consider EPS/ablation if VT recurs on Mexilitine  [name], MD 03/24/2016 6:59 PM

## 2016-03-24 NOTE — Progress Notes (Signed)
Pt went into WCT in the 150s. Dr. Mayford Knife with cardiology notified. 100 mg Lidocaine bolus given and increase gtt to 2, Patient converted to sinus rhythm in the 70s during bolus. Patient alert and oriented and blood pressure stable throughout event. Will continue to monitor patient.

## 2016-03-24 NOTE — Progress Notes (Signed)
      [address]       [name] [address]             [phone]        CARDIOTHORACIC SURGERY PROGRESS NOTE   R5 Days Post-Op Procedure(s) (LRB): MINIMALLY INVASIVE MITRAL VALVE REPAIR (MVR) (Right) TRANSESOPHAGEAL ECHOCARDIOGRAM (TEE) (N/A)  Subjective: No complaints.  Feels well.  Hopes that he won't need to be shocked again today.  Eating well but no BM since surgery.  Objective: Vital signs: BP Readings from Last 1 Encounters:  03/24/16 129/66   Pulse Readings from Last 1 Encounters:  03/24/16 65   Resp Readings from Last 1 Encounters:  03/24/16 14   Temp Readings from Last 1 Encounters:  03/24/16 98.7 F (37.1 C) (Oral)    Hemodynamics:    Physical Exam:  Rhythm:   sinus  Breath sounds: clear  Heart sounds:  RRR w/out murmur  Incisions:  Clean and dry  Abdomen:  Soft, non-distended, non-tender  Extremities:  Warm, well-perfused  Chest tubes:  Low volume thin serosanguinous output, no air leak    Intake/Output from previous day: 10/21 0701 - 10/22 0700 In: 1650.9 [P.O.:800; I.V.:600.9; IV Piggyback:250] Out: 4275 [Urine:4025; Chest Tube:250] Intake/Output this shift: No intake/output data recorded.  Lab Results:  CBC: Recent Labs  03/22/16 0402 03/23/16 0512  WBC 12.9* 11.1*  HGB 10.4* 9.9*  HCT 31.0* 29.9*  PLT 71* 98*    BMET:  Recent Labs  03/23/16 0512 03/23/16 1900 03/24/16 0420  NA 134*  --  135  K 4.0 3.7 4.2  CL 101  --  100*  CO2 28  --  28  GLUCOSE 97  --  98  BUN 18  --  25*  CREATININE 1.28*  --  1.48*  CALCIUM 8.0*  --  7.9*     PT/INR:   Recent Labs  03/24/16 0420  LABPROT 15.5*  INR 1.22    CBG (last 3)   Recent Labs  03/21/16 1124 03/21/16 1621  GLUCAP 155* 153*    ABG    Component Value Date/Time   PHART 7.416 03/19/2016 2153   PCO2ART 29.9 (L) 03/19/2016 2153   PO2ART 108.0 03/19/2016 2153   HCO3 19.3 (L) 03/19/2016 2153   TCO2 23 03/20/2016 1808   ACIDBASEDEF 4.0 (H)  03/19/2016 2153   O2SAT 98.0 03/19/2016 2153    CXR: n/a  Assessment/Plan: S/P Procedure(s) (LRB): MINIMALLY INVASIVE MITRAL VALVE REPAIR (MVR) (Right) TRANSESOPHAGEAL ECHOCARDIOGRAM (TEE) (N/A)  Patient now POD5 Continues to have episodes of sustained VT - last several have broken with bolus lidocaine Currently maintaining NSR on low dose lidocaine drip and low dose metoprolol, started on mexilitine last night Pre-op history of bifascicular block and NSVT  Breathing comfortably w/ O2 sats 93-96% on RA Chronic combined diastolic CHF with expected post-op volume excess, diuresing well and weight down 7 lbs yesterday but still 8 lbs above preop baseline Chest tube output has decreased Expected post op acute blood loss anemia, mild Expected post op atelectasis, mild Post op thrombocytopenia, platelet count upto 98k yesterday CKD stage III, creatinine stable at baseline   Rhythm management per EPS  Continue lasix  Supplement potassium and magnesium aggressively  D/C chest tubes  Increase coumadin  Keep in SICU due to recurrent episodes VT  [name], MD 03/24/2016 9:24 AM

## 2016-03-25 ENCOUNTER — Encounter: Payer: Medicare Other | Admitting: Thoracic Surgery (Cardiothoracic Vascular Surgery)

## 2016-03-25 ENCOUNTER — Inpatient Hospital Stay (HOSPITAL_COMMUNITY): Payer: Medicare Other

## 2016-03-25 LAB — POCT I-STAT, CHEM 8
BUN: 20 mg/dL (ref 6–20)
Calcium, Ion: 1.12 mmol/L — ABNORMAL LOW (ref 1.15–1.40)
Chloride: 100 mmol/L — ABNORMAL LOW (ref 101–111)
Creatinine, Ser: 0.9 mg/dL (ref 0.61–1.24)
Glucose, Bld: 155 mg/dL — ABNORMAL HIGH (ref 65–99)
HCT: 26 % — ABNORMAL LOW (ref 39.0–52.0)
Hemoglobin: 8.8 g/dL — ABNORMAL LOW (ref 13.0–17.0)
Potassium: 3.9 mmol/L (ref 3.5–5.1)
Sodium: 136 mmol/L (ref 135–145)
TCO2: 24 mmol/L (ref 0–100)

## 2016-03-25 LAB — BASIC METABOLIC PANEL
Anion gap: 8 (ref 5–15)
BUN: 19 mg/dL (ref 6–20)
CO2: 28 mmol/L (ref 22–32)
Calcium: 7.9 mg/dL — ABNORMAL LOW (ref 8.9–10.3)
Chloride: 100 mmol/L — ABNORMAL LOW (ref 101–111)
Creatinine, Ser: 1.31 mg/dL — ABNORMAL HIGH (ref 0.61–1.24)
GFR calc Af Amer: 56 mL/min — ABNORMAL LOW (ref >60)
GFR calc non Af Amer: 48 mL/min — ABNORMAL LOW (ref >60)
Glucose, Bld: 97 mg/dL (ref 65–99)
Potassium: 4.5 mmol/L (ref 3.5–5.1)
Sodium: 136 mmol/L (ref 135–145)

## 2016-03-25 LAB — CBC
HCT: 31.8 % — ABNORMAL LOW (ref 39.0–52.0)
Hemoglobin: 10.5 g/dL — ABNORMAL LOW (ref 13.0–17.0)
MCH: 31 pg (ref 26.0–34.0)
MCHC: 33 g/dL (ref 30.0–36.0)
MCV: 93.8 fL (ref 78.0–100.0)
Platelets: 167 10*3/uL (ref 150–400)
RBC: 3.39 MIL/uL — ABNORMAL LOW (ref 4.22–5.81)
RDW: 14.8 % (ref 11.5–15.5)
WBC: 10.5 10*3/uL (ref 4.0–10.5)

## 2016-03-25 LAB — MAGNESIUM: Magnesium: 2.5 mg/dL — ABNORMAL HIGH (ref 1.7–2.4)

## 2016-03-25 LAB — PROTIME-INR
INR: 1.47
Prothrombin Time: 18 seconds — ABNORMAL HIGH (ref 11.4–15.2)

## 2016-03-25 MED ORDER — MEXILETINE HCL 150 MG PO CAPS
300.0000 mg | ORAL_CAPSULE | Freq: Two times a day (BID) | ORAL | Status: DC
  Administered 2016-03-25 – 2016-03-29 (×9): 300 mg via ORAL
  Filled 2016-03-25 (×9): qty 2

## 2016-03-25 MED ORDER — ENOXAPARIN SODIUM 30 MG/0.3ML ~~LOC~~ SOLN
30.0000 mg | SUBCUTANEOUS | Status: DC
  Administered 2016-03-25 – 2016-03-26 (×2): 30 mg via SUBCUTANEOUS
  Filled 2016-03-25 (×2): qty 0.3

## 2016-03-25 MED ORDER — SODIUM CHLORIDE 0.9 % IV SOLN
Freq: Once | INTRAVENOUS | Status: AC
  Administered 2016-03-25: 11:00:00 via INTRAVENOUS

## 2016-03-25 MED ORDER — METOPROLOL TARTRATE 25 MG PO TABS
25.0000 mg | ORAL_TABLET | Freq: Two times a day (BID) | ORAL | Status: DC
  Administered 2016-03-25 – 2016-03-29 (×9): 25 mg via ORAL
  Filled 2016-03-25 (×9): qty 1

## 2016-03-25 NOTE — Progress Notes (Addendum)
      [address]       [name] [address]             [phone]        CARDIOTHORACIC SURGERY PROGRESS NOTE   R6 Days Post-Op Procedure(s) (LRB): MINIMALLY INVASIVE MITRAL VALVE REPAIR (MVR) (Right) TRANSESOPHAGEAL ECHOCARDIOGRAM (TEE) (N/A)  Subjective: Feels well.  Another episode VT overnight that terminated w/ lidocaine bolus  Objective: Vital signs: BP Readings from Last 1 Encounters:  03/25/16 (!) 145/77   Pulse Readings from Last 1 Encounters:  03/25/16 76   Resp Readings from Last 1 Encounters:  03/25/16 16   Temp Readings from Last 1 Encounters:  03/25/16 99.2 F (37.3 C) (Oral)    Hemodynamics:    Physical Exam:  Rhythm:   sinus  Breath sounds: clear  Heart sounds:  RRR w/out murmur  Incisions:  Clean and dry  Abdomen:  Soft, non-distended, non-tender  Extremities:  Warm, well-perfused    Intake/Output from previous day: 10/22 0701 - 10/23 0700 In: 1680.8 [P.O.:700; I.V.:980.8] Out: 1525 [Urine:1475; Chest Tube:50] Intake/Output this shift: No intake/output data recorded.  Lab Results:  CBC: Recent Labs  03/23/16 0512 03/25/16 0510  WBC 11.1* 10.5  HGB 9.9* 10.5*  HCT 29.9* 31.8*  PLT 98* 167    BMET:  Recent Labs  03/24/16 0420 03/25/16 0510  NA 135 136  K 4.2 4.5  CL 100* 100*  CO2 28 28  GLUCOSE 98 97  BUN 25* 19  CREATININE 1.48* 1.31*  CALCIUM 7.9* 7.9*     PT/INR:   Recent Labs  03/25/16 0510  LABPROT 18.0*  INR 1.47    CBG (last 3)  No results for input(s): GLUCAP in the last 72 hours.  ABG    Component Value Date/Time   PHART 7.416 03/19/2016 2153   PCO2ART 29.9 (L) 03/19/2016 2153   PO2ART 108.0 03/19/2016 2153   HCO3 19.3 (L) 03/19/2016 2153   TCO2 23 03/20/2016 1808   ACIDBASEDEF 4.0 (H) 03/19/2016 2153   O2SAT 98.0 03/19/2016 2153    CXR: Looks good except some atelectasis RML +/- small loculated effusion in fissure  Assessment/Plan: S/P Procedure(s) (LRB): MINIMALLY  INVASIVE MITRAL VALVE REPAIR (MVR) (Right) TRANSESOPHAGEAL ECHOCARDIOGRAM (TEE) (N/A)  Patient now POD6 Continues to have episodes of sustained VT - last several have broken with bolus lidocaine Currently maintaining NSR on low dose lidocaine drip, low dose metoprolol,and mexilitine  Pre-op history of bifascicular block and NSVT  Breathing comfortably w/ O2 sats 93-94% on RA Chronic combineddiastolic CHF with expected post-op volume excess, diuresing well and weight down 6 lbs yesterday but still 4 lbs above preop baseline Expected post op acute blood loss anemia, mild Expected post op atelectasis, mild Post op thrombocytopenia, resolved CKD stage III, creatinine stable at baseline   Rhythm management per EP team  Keep in SICU due to recurrent episodes VT  Mobilize  Continue low dose beta blocker and ACE-I  Gentle diuresis  Continue coumadin 5 mg/day  Low dose lovenox until INR therapeutic  [name], MD 03/25/2016 8:23 AM

## 2016-03-25 NOTE — Progress Notes (Signed)
      [address]       [place] [address]             [phone]      Evening rounds  POD # 6  No further VT  Up in chair with no complaints  Viviann Spare C. Dorris Fetch, MD [facility] [phone]

## 2016-03-25 NOTE — Progress Notes (Signed)
Called [employer] at 1020 after patients blood pressure dropped to 75/42 after morning medications. Order received for 250 ml bolus

## 2016-03-25 NOTE — Care Management Important Message (Signed)
Important Message  Patient Details  Name: Roy Palmer MRN: [id number] Date of Birth: 08/13/1929   Medicare Important Message Given:  Yes    Hawthorne Day 03/25/2016, 12:44 PM

## 2016-03-25 NOTE — Progress Notes (Signed)
SUBJECTIVE: The patient is doing well today.  At this time, he denies chest pain, shortness of breath, or any new concerns.  CURRENT MEDICATIONS: . aspirin EC  81 mg Oral Daily  . bisacodyl  10 mg Oral Daily   Or  . bisacodyl  10 mg Rectal Daily  . docusate sodium  200 mg Oral BID  . furosemide  40 mg Oral Daily  . lisinopril  2.5 mg Oral Daily  . metoprolol tartrate  12.5 mg Oral BID  . mexiletine  150 mg Oral Q8H  . pantoprazole  40 mg Oral Daily  . potassium chloride  20 mEq Oral BID  . sodium chloride flush  10-40 mL Intracatheter Q12H  . sodium chloride flush  3 mL Intravenous Q12H  . sodium chloride flush  3 mL Intravenous Q12H  . warfarin  5 mg Oral q1800  . Warfarin - Physician Dosing Inpatient   Does not apply q1800   . lidocaine 2 mg/min (03/25/16 0700)    OBJECTIVE: Physical Exam: Vitals:   03/25/16 0500 03/25/16 0515 03/25/16 0600 03/25/16 0700  BP: (!) 109/98  (!) 148/78 (!) 145/77  Pulse: 79  77 76  Resp: 18  (!) 21 16  Temp:  98.6 F (37 C)    TempSrc:  Oral    SpO2: 92%  94% 94%  Weight:  188 lb 11.4 oz (85.6 kg)    Height:        Intake/Output Summary (Last 24 hours) at 03/25/16 0747 Last data filed at 03/25/16 0700  Gross per 24 hour  Intake          1680.75 ml  Output             1525 ml  Net           155.75 ml    Telemetry reveals sinus rhythm with sustained VT last night - terminated with Lidocaine bolus  GEN- The patient is well appearing, alert and oriented x 3 today.   Head- normocephalic, atraumatic Eyes-  Sclera clear, conjunctiva pink Ears- hearing intact Oropharynx- clear Neck- supple, left central line Lungs- Clear to ausculation bilaterally, normal work of breathing Heart- Regular rate and rhythm  GI- soft, NT, ND, + BS Extremities- no clubbing, cyanosis, or edema Skin- no rash or lesion Psych- euthymic mood, full affect Neuro- strength and sensation are intact  LABS: Basic Metabolic Panel:  Recent Labs   03/24/16 0420 03/25/16 0510  NA 135 136  K 4.2 4.5  CL 100* 100*  CO2 28 28  GLUCOSE 98 97  BUN 25* 19  CREATININE 1.48* 1.31*  CALCIUM 7.9* 7.9*  MG 3.5* 2.5*   CBC:  Recent Labs  03/23/16 0512 03/25/16 0510  WBC 11.1* 10.5  HGB 9.9* 10.5*  HCT 29.9* 31.8*  MCV 92.9 93.8  PLT 98* 167    RADIOLOGY: Dg Chest 2 View Result Date: 03/15/2016 CLINICAL DATA:  CHF. Preoperative examination for mitral valve replacement. History of thoracic aortic aneurysm. EXAM: CHEST  2 VIEW COMPARISON:  Chest x-ray of February 29 2016 FINDINGS: The lungs are adequately inflated and clear. The heart and pulmonary vascularity are normal. There is faint calcification in the wall of the aortic arch. There is mild tortuosity of the descending thoracic aorta. There is no pleural effusion. The bony thorax exhibits no acute abnormality. IMPRESSION: There is no active cardiopulmonary disease. Aortic atherosclerosis. Electronically Signed   By: David  SwazilandJordan M.D.   On: 03/15/2016 07:42  ASSESSMENT AND PLAN:  Principal Problem:   VT (ventricular tachycardia) (HCC) Active Problems:   Severe mitral regurgitation   Near syncope -- cardiogenic   Symptomatic PVCs   Incidental pulmonary nodule   AAA (abdominal aortic aneurysm) without rupture (HCC)   Thoracic aortic aneurysm (HCC)   Vasovagal syncope   Mild CAD   Essential hypertension   CKD (chronic kidney disease), stage III   Hypertensive heart and kidney disease   Hyperlipidemia   S/P minimally invasive mitral valve repair  1.  Ventricular tachycardia Recurrent and sustained. Terminated last night with Lidocaine bolus.   Torryn Hudspeth increase Mexiletine today to 300mg  twice daily today Continue Lidocaine today, Lanell Dubie try to wean if no recurrent VT through the day today.  Brielyn Bosak need ICD for secondary prevention prior to discharge Did not tolerate amiodarone 2/2 hallucinations K and Mg stable   2.  LV dysfunction Continue medical therapy  Increase  Metoprolol today   3.  S/p MVR Management per TCTS  Gypsy Balsam, NP 03/25/2016 7:57 AM  I have seen and examined this patient with Gypsy Balsam.  Agree with above, note added to reflect my findings.  On exam, regular rhythm, no murmurs, lungs clear.  Had episode of VT last night which resolved with lidocaine. Illeana Edick thus increase mexitiline today and stop lidocaine around 12.  He Stuart Guillen likely need an ICD for secondary prevention.  Would likely benefit from an A lead at that time as he may require further medications for treatment of his VT including sotalol.    Percival Glasheen M. Robbyn Hodkinson MD 03/25/2016 8:21 AM

## 2016-03-26 LAB — BASIC METABOLIC PANEL
Anion gap: 7 (ref 5–15)
BUN: 20 mg/dL (ref 6–20)
CO2: 27 mmol/L (ref 22–32)
Calcium: 8.1 mg/dL — ABNORMAL LOW (ref 8.9–10.3)
Chloride: 99 mmol/L — ABNORMAL LOW (ref 101–111)
Creatinine, Ser: 1.27 mg/dL — ABNORMAL HIGH (ref 0.61–1.24)
GFR calc Af Amer: 58 mL/min — ABNORMAL LOW (ref >60)
GFR calc non Af Amer: 50 mL/min — ABNORMAL LOW (ref >60)
Glucose, Bld: 96 mg/dL (ref 65–99)
Potassium: 4.8 mmol/L (ref 3.5–5.1)
Sodium: 133 mmol/L — ABNORMAL LOW (ref 135–145)

## 2016-03-26 LAB — PROTIME-INR
INR: 1.69
Prothrombin Time: 20.1 seconds — ABNORMAL HIGH (ref 11.4–15.2)

## 2016-03-26 MED ORDER — FUROSEMIDE 40 MG PO TABS
40.0000 mg | ORAL_TABLET | Freq: Two times a day (BID) | ORAL | Status: DC
  Administered 2016-03-26 – 2016-03-29 (×7): 40 mg via ORAL
  Filled 2016-03-26 (×7): qty 1

## 2016-03-26 NOTE — Progress Notes (Signed)
SUBJECTIVE: The patient is doing well today.  At this time, he denies chest pain, shortness of breath, or any new concerns.  No VT in last 24 hours.   CURRENT MEDICATIONS: . aspirin EC  81 mg Oral Daily  . bisacodyl  10 mg Oral Daily   Or  . bisacodyl  10 mg Rectal Daily  . docusate sodium  200 mg Oral BID  . enoxaparin (LOVENOX) injection  30 mg Subcutaneous Q24H  . furosemide  40 mg Oral BID  . lisinopril  2.5 mg Oral Daily  . metoprolol tartrate  25 mg Oral BID  . mexiletine  300 mg Oral Q12H  . pantoprazole  40 mg Oral Daily  . potassium chloride  20 mEq Oral BID  . sodium chloride flush  10-40 mL Intracatheter Q12H  . sodium chloride flush  3 mL Intravenous Q12H  . sodium chloride flush  3 mL Intravenous Q12H  . warfarin  5 mg Oral q1800  . Warfarin - Physician Dosing Inpatient   Does not apply q1800   . lidocaine 1 mg/min (03/26/16 0800)    OBJECTIVE: Physical Exam: Vitals:   03/26/16 0600 03/26/16 0700 03/26/16 0708 03/26/16 0800  BP: 137/74 126/90  137/83  Pulse: 71 73  77  Resp: 17 16  (!) 27  Temp:   99.1 F (37.3 C)   TempSrc:   Oral   SpO2: 96% 93%  93%  Weight: 189 lb 9.5 oz (86 kg)     Height:        Intake/Output Summary (Last 24 hours) at 03/26/16 0827 Last data filed at 03/26/16 0800  Gross per 24 hour  Intake            994.5 ml  Output              750 ml  Net            244.5 ml    Telemetry reveals sinus rhythm    GEN- The patient is well appearing, alert and oriented x 3 today.   Head- normocephalic, atraumatic Eyes-  Sclera clear, conjunctiva pink Ears- hearing intact Oropharynx- clear Neck- supple, left central line Lungs- Clear to ausculation bilaterally, normal work of breathing Heart- Regular rate and rhythm  GI- soft, NT, ND, + BS Extremities- no clubbing, cyanosis, or edema Skin- no rash or lesion Psych- euthymic mood, full affect Neuro- strength and sensation are intact  LABS: Basic Metabolic Panel:  Recent Labs  28/31/51 0420 03/25/16 0510 03/26/16 0437  NA 135 136 133*  K 4.2 4.5 4.8  CL 100* 100* 99*  CO2 28 28 27   GLUCOSE 98 97 96  BUN 25* 19 20  CREATININE 1.48* 1.31* 1.27*  CALCIUM 7.9* 7.9* 8.1*  MG 3.5* 2.5*  --    CBC:  Recent Labs  03/25/16 0510  WBC 10.5  HGB 10.5*  HCT 31.8*  MCV 93.8  PLT 167    RADIOLOGY: Dg Chest 2 View Result Date: 03/15/2016 CLINICAL DATA:  CHF. Preoperative examination for mitral valve replacement. History of thoracic aortic aneurysm. EXAM: CHEST  2 VIEW COMPARISON:  Chest x-ray of February 29 2016 FINDINGS: The lungs are adequately inflated and clear. The heart and pulmonary vascularity are normal. There is faint calcification in the wall of the aortic arch. There is mild tortuosity of the descending thoracic aorta. There is no pleural effusion. The bony thorax exhibits no acute abnormality. IMPRESSION: There is no active cardiopulmonary disease. Aortic atherosclerosis.  Electronically Signed   By: David  [place]ilandJordan M.D.   On: 03/15/2016 07:42   ASSESSMENT AND PLAN:  Principal Problem:   VT (ventricular tachycardia) (HCC) Active Problems:   Severe mitral regurgitation   Near syncope -- cardiogenic   Symptomatic PVCs   Incidental pulmonary nodule   AAA (abdominal aortic aneurysm) without rupture (HCC)   Thoracic aortic aneurysm (HCC)   Vasovagal syncope   Mild CAD   Essential hypertension   CKD (chronic kidney disease), stage III   Hypertensive heart and kidney disease   Hyperlipidemia   S/P minimally invasive mitral valve repair  1.  Ventricular tachycardia No recurrence in >24 hours Shekera Beavers discuss with Dr Elberta Fortisamnitz stopping Lidocaine today  Continue Mexiletine and Metoprolol  Lanyia Jewel need ICD for secondary prevention prior to discharge. Would place dual chamber ICD.  Did not tolerate amiodarone 2/2 hallucinations No driving x6 months per NCDMV  2.  LV dysfunction Continue medical therapy   3.  S/p MVR Management per TCTS  Gypsy BalsamAmber Seiler,  NP 03/26/2016 8:27 AM   I have seen and examined this patient with Gypsy BalsamAmber Seiler.  Agree with above, note added to reflect my findings.  On exam, regular rhythm, no murmurs, lungs clear. Has had no further episodes of VT overnight.  At this time, agree with stopping lidocaine and continuing Mexitil.  Edyn Popoca need ICD the day prior to discharge.  Did have an episode where found unresponsive but had normal BP and rhythm was sinus in the 50s. May improve with stopping lidocaine.  Shaylea Ucci M. Tanaja Ganger MD 03/26/2016 10:40 AM

## 2016-03-26 NOTE — Progress Notes (Signed)
CT Surgery PM Rounds  Patient has fully recovered from earlier episode of unresponsiveness-possibly vagal episode associated with bowel movement Sinus rhythm stable vitals

## 2016-03-26 NOTE — Plan of Care (Addendum)
Found patient unresponsive on bedside commode, lifted back to bed with 4 assists. Patient now responsive. Patient had lopressor 25 mg and mexitil 300 mg at 0900. Moved prinivil to 1400. Will continue to monitor HR down to 54/minute, BP OK

## 2016-03-26 NOTE — Progress Notes (Addendum)
      [address]       [name] [address]             [phone]        CARDIOTHORACIC SURGERY PROGRESS NOTE   R7 Days Post-Op Procedure(s) (LRB): MINIMALLY INVASIVE MITRAL VALVE REPAIR (MVR) (Right) TRANSESOPHAGEAL ECHOCARDIOGRAM (TEE) (N/A)  Subjective: No complaints.  Looks good.  Ambulated around SICU already this morning.  No episodes VT last 24 hours.  Objective: Vital signs: BP Readings from Last 1 Encounters:  03/26/16 126/90   Pulse Readings from Last 1 Encounters:  03/26/16 73   Resp Readings from Last 1 Encounters:  03/26/16 16   Temp Readings from Last 1 Encounters:  03/26/16 99.1 F (37.3 C) (Oral)    Hemodynamics:    Physical Exam:  Rhythm:   sinus  Breath sounds: clear  Heart sounds:  RRR w/out murmur  Incisions:  Clean and dry  Abdomen:  Soft, non-distended, non-tender  Extremities:  Warm, well-perfused    Intake/Output from previous day: 10/23 0701 - 10/24 0700 In: 1019.5 [P.O.:240; I.V.:779.5] Out: 750 [Urine:750] Intake/Output this shift: No intake/output data recorded.  Lab Results:  CBC: Recent Labs  03/25/16 0510  WBC 10.5  HGB 10.5*  HCT 31.8*  PLT 167    BMET:  Recent Labs  03/25/16 0510 03/26/16 0437  NA 136 133*  K 4.5 4.8  CL 100* 99*  CO2 28 27  GLUCOSE 97 96  BUN 19 20  CREATININE 1.31* 1.27*  CALCIUM 7.9* 8.1*     PT/INR:   Recent Labs  03/26/16 0437  LABPROT 20.1*  INR 1.69    CBG (last 3)  No results for input(s): GLUCAP in the last 72 hours.  ABG    Component Value Date/Time   PHART 7.416 03/19/2016 2153   PCO2ART 29.9 (L) 03/19/2016 2153   PO2ART 108.0 03/19/2016 2153   HCO3 19.3 (L) 03/19/2016 2153   TCO2 24 03/22/2016 1711   ACIDBASEDEF 4.0 (H) 03/19/2016 2153   O2SAT 98.0 03/19/2016 2153    CXR: n/a  Assessment/Plan: S/P Procedure(s) (LRB): MINIMALLY INVASIVE MITRAL VALVE REPAIR (MVR) (Right) TRANSESOPHAGEAL ECHOCARDIOGRAM (TEE) (N/A)  Patient  nowPOD7 Recurrent episodes of sustained VT - last several have broken with bolus lidocaine - none over past 24 hours Currently maintaining NSR on low dose lidocaine drip, metoprolol,and mexilitine  Pre-op history of bifascicular block and NSVT  Breathing comfortably w/ O2 sats 93-96% on RA Chronic diastolic CHF with expected post-op volume excess, I/O's balanced last 48 hours but weight still 4-5lbs above preop baseline Expected post op acute blood loss anemia, mild Expected post op atelectasis, mild CKD stage III, creatinine stable at baseline   Rhythm management per EP team  Keep in SICU due to recurrent episodes VT  Mobilize  Continue low dose beta blocker and ACE-I  Gentle diuresis - increase lasix 40 mg bid  Monitor serum potassium and magnesium and supplement as needed  Continue coumadin 5 mg/day  Low dose lovenox until INR therapeutic  [name], MD 03/26/2016 7:51 AM

## 2016-03-27 ENCOUNTER — Encounter (HOSPITAL_COMMUNITY)
Admission: AD | Disposition: A | Discharge status: Home / Self Care | Payer: Self-pay | Source: Ambulatory Visit | Attending: Thoracic Surgery (Cardiothoracic Vascular Surgery)

## 2016-03-27 ENCOUNTER — Other Ambulatory Visit: Payer: Self-pay

## 2016-03-27 DIAGNOSIS — I472 Ventricular tachycardia: Secondary | ICD-10-CM

## 2016-03-27 HISTORY — PX: EP IMPLANTABLE DEVICE: SHX172B

## 2016-03-27 LAB — BASIC METABOLIC PANEL
Anion gap: 8 (ref 5–15)
BUN: 17 mg/dL (ref 6–20)
CO2: 28 mmol/L (ref 22–32)
Calcium: 8.2 mg/dL — ABNORMAL LOW (ref 8.9–10.3)
Chloride: 101 mmol/L (ref 101–111)
Creatinine, Ser: 1.26 mg/dL — ABNORMAL HIGH (ref 0.61–1.24)
GFR calc Af Amer: 59 mL/min — ABNORMAL LOW (ref >60)
GFR calc non Af Amer: 51 mL/min — ABNORMAL LOW (ref >60)
Glucose, Bld: 92 mg/dL (ref 65–99)
Potassium: 4.4 mmol/L (ref 3.5–5.1)
Sodium: 137 mmol/L (ref 135–145)

## 2016-03-27 LAB — PROTIME-INR
INR: 1.69
Prothrombin Time: 20.1 seconds — ABNORMAL HIGH (ref 11.4–15.2)

## 2016-03-27 LAB — CREATININE, SERUM
Creatinine, Ser: 1.3 mg/dL — ABNORMAL HIGH (ref 0.61–1.24)
GFR calc Af Amer: 56 mL/min — ABNORMAL LOW (ref >60)
GFR calc non Af Amer: 49 mL/min — ABNORMAL LOW (ref >60)

## 2016-03-27 LAB — CBC
HCT: 36.3 % — ABNORMAL LOW (ref 39.0–52.0)
Hemoglobin: 12.1 g/dL — ABNORMAL LOW (ref 13.0–17.0)
MCH: 31.4 pg (ref 26.0–34.0)
MCHC: 33.3 g/dL (ref 30.0–36.0)
MCV: 94.3 fL (ref 78.0–100.0)
Platelets: 158 10*3/uL (ref 150–400)
RBC: 3.85 MIL/uL — ABNORMAL LOW (ref 4.22–5.81)
RDW: 14.3 % (ref 11.5–15.5)
WBC: 9.3 10*3/uL (ref 4.0–10.5)

## 2016-03-27 LAB — MAGNESIUM: Magnesium: 2.3 mg/dL (ref 1.7–2.4)

## 2016-03-27 SURGERY — ICD IMPLANT

## 2016-03-27 MED ORDER — LIDOCAINE HCL (PF) 1 % IJ SOLN
INTRAMUSCULAR | Status: DC | PRN
  Administered 2016-03-27: 60 mL via SUBCUTANEOUS

## 2016-03-27 MED ORDER — CHLORHEXIDINE GLUCONATE 4 % EX LIQD
60.0000 mL | Freq: Once | CUTANEOUS | Status: AC
  Administered 2016-03-27: 4 via TOPICAL
  Filled 2016-03-27: qty 15

## 2016-03-27 MED ORDER — IOPAMIDOL (ISOVUE-370) INJECTION 76%
INTRAVENOUS | Status: AC
  Filled 2016-03-27: qty 50

## 2016-03-27 MED ORDER — WARFARIN - PHYSICIAN DOSING INPATIENT
Freq: Every day | Status: DC
Start: 2016-03-27 — End: 2016-03-27

## 2016-03-27 MED ORDER — FENTANYL CITRATE (PF) 100 MCG/2ML IJ SOLN
INTRAMUSCULAR | Status: AC
  Filled 2016-03-27: qty 2

## 2016-03-27 MED ORDER — ENOXAPARIN SODIUM 40 MG/0.4ML ~~LOC~~ SOLN
40.0000 mg | SUBCUTANEOUS | Status: DC
  Filled 2016-03-27: qty 0.4

## 2016-03-27 MED ORDER — CHLORHEXIDINE GLUCONATE 4 % EX LIQD
60.0000 mL | Freq: Once | CUTANEOUS | Status: AC
  Filled 2016-03-27: qty 15

## 2016-03-27 MED ORDER — WARFARIN - PHARMACIST DOSING INPATIENT: Freq: Every day | Status: DC

## 2016-03-27 MED ORDER — LIDOCAINE HCL (PF) 1 % IJ SOLN
INTRAMUSCULAR | Status: AC
  Filled 2016-03-27: qty 60

## 2016-03-27 MED ORDER — CEFAZOLIN IN D5W 1 GM/50ML IV SOLN
1.0000 g | Freq: Four times a day (QID) | INTRAVENOUS | Status: AC
  Administered 2016-03-27 – 2016-03-28 (×3): 1 g via INTRAVENOUS
  Filled 2016-03-27 (×3): qty 50

## 2016-03-27 MED ORDER — ENOXAPARIN SODIUM 30 MG/0.3ML ~~LOC~~ SOLN
30.0000 mg | SUBCUTANEOUS | Status: DC
  Administered 2016-03-27: 30 mg via SUBCUTANEOUS
  Filled 2016-03-27: qty 0.3

## 2016-03-27 MED ORDER — HEPARIN (PORCINE) IN NACL 2-0.9 UNIT/ML-% IJ SOLN
INTRAMUSCULAR | Status: DC | PRN
  Administered 2016-03-27: 500 mL

## 2016-03-27 MED ORDER — SODIUM CHLORIDE 0.9 % IR SOLN
80.0000 mg | Status: AC
  Administered 2016-03-27: 80 mg

## 2016-03-27 MED ORDER — CEFAZOLIN SODIUM-DEXTROSE 2-4 GM/100ML-% IV SOLN
2.0000 g | INTRAVENOUS | Status: AC
  Administered 2016-03-27: 2 g via INTRAVENOUS

## 2016-03-27 MED ORDER — SODIUM CHLORIDE 0.9 % IV SOLN
INTRAVENOUS | Status: DC
  Administered 2016-03-27: 10:00:00 via INTRAVENOUS

## 2016-03-27 MED ORDER — HEPARIN (PORCINE) IN NACL 2-0.9 UNIT/ML-% IJ SOLN
INTRAMUSCULAR | Status: AC
  Filled 2016-03-27: qty 500

## 2016-03-27 MED ORDER — SODIUM CHLORIDE 0.9 % IR SOLN
Status: AC
  Filled 2016-03-27: qty 2

## 2016-03-27 MED ORDER — MIDAZOLAM HCL 5 MG/5ML IJ SOLN
INTRAMUSCULAR | Status: AC
  Filled 2016-03-27: qty 5

## 2016-03-27 MED ORDER — MIDAZOLAM HCL 5 MG/5ML IJ SOLN
INTRAMUSCULAR | Status: DC | PRN
  Administered 2016-03-27 (×6): 1 mg via INTRAVENOUS

## 2016-03-27 MED ORDER — WARFARIN SODIUM 7.5 MG PO TABS
7.5000 mg | ORAL_TABLET | Freq: Every day | ORAL | Status: AC
  Administered 2016-03-27: 7.5 mg via ORAL
  Filled 2016-03-27: qty 1

## 2016-03-27 MED ORDER — FENTANYL CITRATE (PF) 100 MCG/2ML IJ SOLN
INTRAMUSCULAR | Status: DC | PRN
  Administered 2016-03-27: 25 ug via INTRAVENOUS
  Administered 2016-03-27 (×4): 12.5 ug via INTRAVENOUS

## 2016-03-27 MED ORDER — CEFAZOLIN SODIUM-DEXTROSE 2-4 GM/100ML-% IV SOLN
INTRAVENOUS | Status: AC
  Filled 2016-03-27: qty 100

## 2016-03-27 MED ORDER — IOPAMIDOL (ISOVUE-370) INJECTION 76%
INTRAVENOUS | Status: DC | PRN
  Administered 2016-03-27: 10 mL via INTRAVENOUS

## 2016-03-27 MED ORDER — ACETAMINOPHEN 325 MG PO TABS: 325.0000 mg | ORAL_TABLET | ORAL | Status: DC | PRN

## 2016-03-27 MED ORDER — ONDANSETRON HCL 4 MG/2ML IJ SOLN: 4.0000 mg | Freq: Four times a day (QID) | INTRAMUSCULAR | Status: DC | PRN

## 2016-03-27 SURGICAL SUPPLY — 10 items
CABLE SURGICAL S-101-97-12 (CABLE) ×3 IMPLANT
ICD EVERA XT MRI DF1  DDMB1D1 (ICD Generator) ×2 IMPLANT
ICD EVERA XT MRI DF1 DDMB1D1 (ICD Generator) ×1 IMPLANT
LEAD CAPSURE NOVUS 5076-52CM (Lead) ×3 IMPLANT
LEAD SPRINT QUAT SEC 6935-65CM (Lead) ×3 IMPLANT
PAD DEFIB LIFELINK (PAD) ×3 IMPLANT
SHEATH CLASSIC 7F (SHEATH) ×3 IMPLANT
SHEATH CLASSIC 9F (SHEATH) ×3 IMPLANT
TRAY PACEMAKER INSERTION (PACKS) ×3 IMPLANT
WIRE HI TORQ VERSACORE-J 145CM (WIRE) ×3 IMPLANT

## 2016-03-27 NOTE — Progress Notes (Addendum)
TCTS DAILY ICU PROGRESS NOTE                   [address]            [place] [address]          [phone]   8 Days Post-Op Procedure(s) (LRB): MINIMALLY INVASIVE MITRAL VALVE REPAIR (MVR) (Right) TRANSESOPHAGEAL ECHOCARDIOGRAM (TEE) (N/A)  Total Length of Stay:  LOS: 15 days   Subjective: Patient sitting in chair without complaints. Wants to know "the plan" because wants to go home. He also does not like food.   Objective: Vital signs in last 24 hours: Temp:  [97.5 F (36.4 C)-98.8 F (37.1 C)] 98.8 F (37.1 C) (10/25 0400) Pulse Rate:  [65-102] 80 (10/25 0800) Cardiac Rhythm: Normal sinus rhythm (10/25 0808) Resp:  [9-23] 19 (10/25 0800) BP: (93-141)/(59-107) 101/80 (10/25 0800) SpO2:  [89 %-98 %] 91 % (10/25 0800) Weight:  [184 lb 4.9 oz (83.6 kg)] 184 lb 4.9 oz (83.6 kg) (10/25 0500)  Filed Weights   03/25/16 0515 03/26/16 0600 03/27/16 0500  Weight: 188 lb 11.4 oz (85.6 kg) 189 lb 9.5 oz (86 kg) 184 lb 4.9 oz (83.6 kg)    Weight change: -5 lb 4.7 oz (-2.4 kg)       Intake/Output from previous day: 10/24 0701 - 10/25 0700 In: 905 [P.O.:800; I.V.:105] Out: 2683 [Urine:2681; Stool:2]  Intake/Output this shift: Total I/O In: 180 [P.O.:180] Out: 250 [Urine:250]  Current Meds: Scheduled Meds: . aspirin EC  81 mg Oral Daily  . bisacodyl  10 mg Oral Daily   Or  . bisacodyl  10 mg Rectal Daily  . docusate sodium  200 mg Oral BID  . enoxaparin (LOVENOX) injection  30 mg Subcutaneous Q24H  . furosemide  40 mg Oral BID  . lisinopril  2.5 mg Oral Daily  . metoprolol tartrate  25 mg Oral BID  . mexiletine  300 mg Oral Q12H  . pantoprazole  40 mg Oral Daily  . potassium chloride  20 mEq Oral BID  . sodium chloride flush  10-40 mL Intracatheter Q12H  . sodium chloride flush  3 mL Intravenous Q12H  . sodium chloride flush  3 mL Intravenous Q12H  . warfarin  7.5 mg Oral q1800  . Warfarin - Pharmacist Dosing Inpatient   Does not apply q1800     Continuous Infusions:  PRN Meds:.sodium chloride, acetaminophen, metoprolol, morphine injection, ondansetron (ZOFRAN) IV, sodium chloride flush, sodium chloride flush, sodium chloride flush, traMADol  General appearance: alert, cooperative and no distress Neurologic: intact Heart: RRR, no murmur Lungs: Slightly diminished at right base and clear on left Extremities: Trace LE edema Wound: Clean and dry  Lab Results: CBC: Recent Labs  03/25/16 0510  WBC 10.5  HGB 10.5*  HCT 31.8*  PLT 167   BMET:  Recent Labs  03/26/16 0437 03/27/16 0356  NA 133* 137  K 4.8 4.4  CL 99* 101  CO2 27 28  GLUCOSE 96 92  BUN 20 17  CREATININE 1.27* 1.26*  CALCIUM 8.1* 8.2*    CMET: Lab Results  Component Value Date   WBC 10.5 03/25/2016   HGB 10.5 (L) 03/25/2016   HCT 31.8 (L) 03/25/2016   PLT 167 03/25/2016   GLUCOSE 92 03/27/2016   CHOL 182 03/13/2016   TRIG 163 (H) 03/13/2016   HDL 33 (L) 03/13/2016   LDLCALC 116 (H) 03/13/2016   ALT 23 03/15/2016   AST 20 03/15/2016  NA 137 03/27/2016   K 4.4 03/27/2016   CL 101 03/27/2016   CREATININE 1.26 (H) 03/27/2016   BUN 17 03/27/2016   CO2 28 03/27/2016   TSH 1.554 03/12/2016   INR 1.69 03/27/2016   HGBA1C 5.8 (H) 03/15/2016    PT/INR:  Recent Labs  03/27/16 0356  LABPROT 20.1*  INR 1.69   Radiology: No results found.   Assessment/Plan: S/P Procedure(s) (LRB): MINIMALLY INVASIVE MITRAL VALVE REPAIR (MVR) (Right) TRANSESOPHAGEAL ECHOCARDIOGRAM (TEE) (N/A)  1. CV-Had one episode of unresponsiveness yesterday but no further VT. Maintaining SR on Lopressor 25 mg bid, Lisinopril 2.5 mg daily, Mexiletine 300 mg bid, and Coumadin. INR remains 1.69. Will not increase Coumadin until decide on timing of ICD.  2. Pulmonary-On room air. Encourage incentive spirometer. 3. Chronic diastolic CHF, volume overload post op-on Lasix 40 mg bid. 4. CKD(stage III)-creatinine stable at 1.26  ZIMMERMAN,DONIELLE M PA-C 03/27/2016 8:19  AM   I have seen and examined the patient and agree with the assessment and plan as outlined.  Will defer management and D/C plans to EPS team.  Patient is doing well and ready to go home from surgical standpoint.  Purcell Nailslarence H Bentlee Drier, MD 03/27/2016 9:53 AM

## 2016-03-27 NOTE — Progress Notes (Signed)
SUBJECTIVE: The patient is doing well today.  At this time, he denies chest pain, shortness of breath, or any new concerns.  No VT in last 48 hours. 1 episode of unresponsiveness yesterday while on the commode with normal BP and HR.   CURRENT MEDICATIONS: . aspirin EC  81 mg Oral Daily  . bisacodyl  10 mg Oral Daily   Or  . bisacodyl  10 mg Rectal Daily  . docusate sodium  200 mg Oral BID  . enoxaparin (LOVENOX) injection  30 mg Subcutaneous Q24H  . furosemide  40 mg Oral BID  . lisinopril  2.5 mg Oral Daily  . metoprolol tartrate  25 mg Oral BID  . mexiletine  300 mg Oral Q12H  . pantoprazole  40 mg Oral Daily  . potassium chloride  20 mEq Oral BID  . sodium chloride flush  10-40 mL Intracatheter Q12H  . sodium chloride flush  3 mL Intravenous Q12H  . sodium chloride flush  3 mL Intravenous Q12H  . warfarin  7.5 mg Oral q1800  . Warfarin - Pharmacist Dosing Inpatient   Does not apply q1800      OBJECTIVE: Physical Exam: Vitals:   03/27/16 0600 03/27/16 0700 03/27/16 0800 03/27/16 0913  BP:  131/77 101/80   Pulse:  78 80   Resp: (!) 22 (!) 9 19   Temp:    98.2 F (36.8 C)  TempSrc:    Oral  SpO2:  91% 91%   Weight:      Height:        Intake/Output Summary (Last 24 hours) at 03/27/16 0921 Last data filed at 03/27/16 0859  Gross per 24 hour  Intake              855 ml  Output             2533 ml  Net            -1678 ml    Telemetry reveals sinus rhythm    GEN- The patient is well appearing, alert and oriented x 3 today.   Head- normocephalic, atraumatic Eyes-  Sclera clear, conjunctiva pink Ears- hearing intact Oropharynx- clear Neck- supple, right central line Lungs- Clear to ausculation bilaterally, normal work of breathing Heart- Regular rate and rhythm  GI- soft, NT, ND, + BS Extremities- no clubbing, cyanosis, or edema Skin- no rash or lesion Psych- euthymic mood, full affect Neuro- strength and sensation are intact  LABS: Basic Metabolic  Panel:  Recent Labs  03/25/16 0510 03/26/16 0437 03/27/16 0356  NA 136 133* 137  K 4.5 4.8 4.4  CL 100* 99* 101  CO2 28 27 28   GLUCOSE 97 96 92  BUN 19 20 17   CREATININE 1.31* 1.27* 1.26*  CALCIUM 7.9* 8.1* 8.2*  MG 2.5*  --  2.3   CBC:  Recent Labs  03/25/16 0510  WBC 10.5  HGB 10.5*  HCT 31.8*  MCV 93.8  PLT 167    RADIOLOGY: Dg Chest 2 View Result Date: 03/15/2016 CLINICAL DATA:  CHF. Preoperative examination for mitral valve replacement. History of thoracic aortic aneurysm. EXAM: CHEST  2 VIEW COMPARISON:  Chest x-ray of February 29 2016 FINDINGS: The lungs are adequately inflated and clear. The heart and pulmonary vascularity are normal. There is faint calcification in the wall of the aortic arch. There is mild tortuosity of the descending thoracic aorta. There is no pleural effusion. The bony thorax exhibits no acute abnormality. IMPRESSION: There is  no active cardiopulmonary disease. Aortic atherosclerosis. Electronically Signed   By: David  [place] M.D.   On: 03/15/2016 07:42   ASSESSMENT AND PLAN:  Principal Problem:   VT (ventricular tachycardia) (HCC) Active Problems:   Severe mitral regurgitation   Near syncope -- cardiogenic   Symptomatic PVCs   Incidental pulmonary nodule   AAA (abdominal aortic aneurysm) without rupture (HCC)   Thoracic aortic aneurysm (HCC)   Vasovagal syncope   Mild CAD   Essential hypertension   CKD (chronic kidney disease), stage III   Hypertensive heart and kidney disease   Hyperlipidemia   S/P minimally invasive mitral valve repair  1.  Ventricular tachycardia No recurrence in >48 hours Continue Mexiletine and Metoprolol  Would plan dual chamber ICD later today for secondary prevention.  Did not tolerate amiodarone 2/2 hallucinations No driving x6 months per NCDMV  2.  LV dysfunction Continue medical therapy   3.  S/p MVR Management per TCTS  Gypsy Balsam, NP 03/27/2016 9:21 AM   EP Attending  Patient seen and  examined. I have discussed the risks/benefits/goals/expectations of ICD implant with the patient and he wishes to proceed.  Leonia Reeves.D.

## 2016-03-27 NOTE — H&P (Signed)
ICD Criteria  Current LVEF:40%. Within 12 months prior to implant: Yes   Heart failure history: Yes, Class II  Cardiomyopathy history: Yes, Non-Ischemic Cardiomyopathy.  Atrial Fibrillation/Atrial Flutter: No.  Ventricular tachycardia history: Yes, Hemodynamic instability present. VT Type: Sustained Ventricular Tachycardia - Monomorphic.  Cardiac arrest history: No.  History of syndromes with risk of sudden death: No.  Previous ICD: No.  Current ICD indication: Secondary  PPM indication: No.   Class I or II Bradycardia indication present: No  Beta Blocker therapy for 3 or more months: Yes, prescribed.   Ace Inhibitor/ARB therapy for 3 or more months: Yes, prescribed.

## 2016-03-27 NOTE — Progress Notes (Signed)
Patient ID: [name], male   DOB: 1929/12/11, 82 y.o.   MRN: [id number] EVENING ROUNDS NOTE :     [address]       [place] [address]             [phone]                 Day of Surgery Procedure(s) (LRB): ICD Implant Dual Chamber (N/A)  Total Length of Stay:  LOS: 15 days  BP 99/63   Pulse 92   Temp 98.2 F (36.8 C) (Oral)   Resp 19   Ht 5\' 11"  (1.803 m)   Wt 184 lb 4.9 oz (83.6 kg)   SpO2 97%   BMI 25.71 kg/m   .Intake/Output      10/25 0701 - 10/26 0700   P.O. 420   I.V. (mL/kg) 200 (2.4)   IV Piggyback 50   Total Intake(mL/kg) 670 (8)   Urine (mL/kg/hr) 1050 (0.9)   Stool 1 (0)   Total Output 1051   Net -381             Lab Results  Component Value Date   WBC 9.3 03/27/2016   HGB 12.1 (L) 03/27/2016   HCT 36.3 (L) 03/27/2016   PLT 158 03/27/2016   GLUCOSE 92 03/27/2016   CHOL 182 03/13/2016   TRIG 163 (H) 03/13/2016   HDL 33 (L) 03/13/2016   LDLCALC 116 (H) 03/13/2016   ALT 23 03/15/2016   AST 20 03/15/2016   NA 137 03/27/2016   K 4.4 03/27/2016   CL 101 03/27/2016   CREATININE 1.30 (H) 03/27/2016   BUN 17 03/27/2016   CO2 28 03/27/2016   TSH 1.554 03/12/2016   INR 1.69 03/27/2016   HGBA1C 5.8 (H) 03/15/2016   aicd placed today, stable  Delight Ovens MD  Beeper [phone] Office [phone] 03/27/2016 9:21 PM

## 2016-03-27 NOTE — Progress Notes (Signed)
ANTICOAGULATION CONSULT NOTE - Initial Consult  Pharmacy Consult for Coumadin Indication: mitral valve repair  Allergies  Allergen Reactions  . Amiodarone Other (See Comments)    Terrible dreams/nightmares  . Lyrica [Pregabalin] Nausea And Vomiting    Patient Measurements: Height: 5\' 11"  (180.3 cm) Weight: 184 lb 4.9 oz (83.6 kg) IBW/kg (Calculated) : 75.3  Vital Signs: Temp: 98.8 F (37.1 C) (10/25 0400) Temp Source: Oral (10/25 0400) BP: 130/80 (10/25 0500) Pulse Rate: 72 (10/25 0500)  Labs:  Recent Labs  03/25/16 0510 03/26/16 0437 03/27/16 0356  HGB 10.5*  --   --   HCT 31.8*  --   --   PLT 167  --   --   LABPROT 18.0* 20.1* 20.1*  INR 1.47 1.69 1.69  CREATININE 1.31* 1.27* 1.26*    Estimated Creatinine Clearance: 46.5 mL/min (by C-G formula based on SCr of 1.26 mg/dL (H)).   Medical History: Past Medical History:  Diagnosis Date  . AAA (abdominal aortic aneurysm) without rupture (HCC) 03/12/2016   Small - measured 3.3 x 3.5 cm by CTA  . CKD (chronic kidney disease) stage 3, GFR 30-59 ml/min   . Essential hypertension   . Hyperlipidemia   . Hypertensive kidney disease   . Incidental pulmonary nodule 03/12/2016   Vague opacity right lung base requires radiographic follow up - index of suspicion is LOW  . Near syncope    Cardiogenic, related to an SVT and severe MR  . Non-sustained ventricular tachycardia (HCC)   . S/P minimally invasive mitral valve repair 03/19/2016   Complex valvuloplasty including triangular resection of flail segment of posterior leaflet, chordal transposition x1, artificial Gore-tex neochord placement x4 and 34 mm Sorin Memo 3D ring annuloplasty via right mini thoracotomy approach with clipping of LA appendage  . Severe mitral regurgitation by prior echocardiogram 02/23/2016   Confirmed by TEE  . Syncope 03/15/2016  . Thoracic aortic aneurysm (HCC) 03/12/2016   Very very mild fusiform enlargement of distal transverse and proximal  descending thoracic aorta noted on CTA  . Trigeminal neuralgia     Medications:  Prescriptions Prior to Admission  Medication Sig Dispense Refill Last Dose  . aspirin EC 81 MG tablet Take 1 tablet (81 mg total) by mouth daily.   03/12/2016 at Unknown time  . ibuprofen (ADVIL,MOTRIN) 200 MG tablet Take 200-400 mg by mouth every 6 (six) hours as needed for headache or moderate pain.   Past Week at Unknown time  . omeprazole (PRILOSEC) 20 MG capsule Take 20 mg by mouth daily.   03/12/2016 at Unknown time   Scheduled:  . aspirin EC  81 mg Oral Daily  . bisacodyl  10 mg Oral Daily   Or  . bisacodyl  10 mg Rectal Daily  . docusate sodium  200 mg Oral BID  . enoxaparin (LOVENOX) injection  30 mg Subcutaneous Q24H  . furosemide  40 mg Oral BID  . lisinopril  2.5 mg Oral Daily  . metoprolol tartrate  25 mg Oral BID  . mexiletine  300 mg Oral Q12H  . pantoprazole  40 mg Oral Daily  . potassium chloride  20 mEq Oral BID  . sodium chloride flush  10-40 mL Intracatheter Q12H  . sodium chloride flush  3 mL Intravenous Q12H  . sodium chloride flush  3 mL Intravenous Q12H  . warfarin  7.5 mg Oral q1800  . Warfarin - Pharmacist Dosing Inpatient   Does not apply q1800    Assessment: 82yo male underwent  mitral valve repair for severe MR on 10/17, started Coumadin per TCTS on 10/18 post-op, INR has been increasing slowly, now pharmacy asked to continue Coumadin dosing.  Goal of Therapy:  INR 2-3   Plan:  Will give Coumadin 7.5mg  po x1 today as ordered by Dr Cornelius Moraswen and monitor INR for dose adjustments; plan anticoag education when pt out of ICU.  Vernard GamblesVeronda Kortney Potvin, PharmD, BCPS  03/27/2016,6:37 AM

## 2016-03-28 ENCOUNTER — Encounter (HOSPITAL_COMMUNITY): Payer: Self-pay | Admitting: Internal Medicine

## 2016-03-28 ENCOUNTER — Inpatient Hospital Stay (HOSPITAL_COMMUNITY): Payer: Medicare Other

## 2016-03-28 LAB — PROTIME-INR
INR: 2.14
Prothrombin Time: 24.2 seconds — ABNORMAL HIGH (ref 11.4–15.2)

## 2016-03-28 MED ORDER — WARFARIN SODIUM 5 MG PO TABS
5.0000 mg | ORAL_TABLET | Freq: Every day | ORAL | Status: DC
  Administered 2016-03-28: 5 mg via ORAL
  Filled 2016-03-28: qty 1

## 2016-03-28 NOTE — Progress Notes (Signed)
ANTICOAGULATION CONSULT NOTE - Follow Up Consult  Pharmacy Consult for Coumadin Indication: mitral valve repair  Allergies  Allergen Reactions  . Amiodarone Other (See Comments)    Terrible dreams/nightmares  . Lyrica [Pregabalin] Nausea And Vomiting    Patient Measurements: Height: 5\' 11"  (180.3 cm) Weight: 181 lb 10.5 oz (82.4 kg) IBW/kg (Calculated) : 75.3  Vital Signs: Temp: 98.7 F (37.1 C) (10/26 0709) Temp Source: Oral (10/26 0709) BP: 110/60 (10/26 0700) Pulse Rate: 80 (10/26 0700)  Labs:  Recent Labs  03/26/16 0437 03/27/16 0356 03/27/16 1649 03/28/16 0233  HGB  --   --  12.1*  --   HCT  --   --  36.3*  --   PLT  --   --  158  --   LABPROT 20.1* 20.1*  --  24.2*  INR 1.69 1.69  --  2.14  CREATININE 1.27* 1.26* 1.30*  --     Estimated Creatinine Clearance: 45.1 mL/min (by C-G formula based on SCr of 1.3 mg/dL (H)).   Medical History: Past Medical History:  Diagnosis Date  . AAA (abdominal aortic aneurysm) without rupture (HCC) 03/12/2016   Small - measured 3.3 x 3.5 cm by CTA  . CKD (chronic kidney disease) stage 3, GFR 30-59 ml/min   . Essential hypertension   . Hyperlipidemia   . Hypertensive kidney disease   . Incidental pulmonary nodule 03/12/2016   Vague opacity right lung base requires radiographic follow up - index of suspicion is LOW  . Near syncope    Cardiogenic, related to an SVT and severe MR  . Non-sustained ventricular tachycardia (HCC)   . S/P minimally invasive mitral valve repair 03/19/2016   Complex valvuloplasty including triangular resection of flail segment of posterior leaflet, chordal transposition x1, artificial Gore-tex neochord placement x4 and 34 mm Sorin Memo 3D ring annuloplasty via right mini thoracotomy approach with clipping of LA appendage  . Severe mitral regurgitation by prior echocardiogram 02/23/2016   Confirmed by TEE  . Syncope 03/15/2016  . Thoracic aortic aneurysm (HCC) 03/12/2016   Very very mild fusiform  enlargement of distal transverse and proximal descending thoracic aorta noted on CTA  . Trigeminal neuralgia     Medications:  Prescriptions Prior to Admission  Medication Sig Dispense Refill Last Dose  . aspirin EC 81 MG tablet Take 1 tablet (81 mg total) by mouth daily.   03/12/2016 at Unknown time  . ibuprofen (ADVIL,MOTRIN) 200 MG tablet Take 200-400 mg by mouth every 6 (six) hours as needed for headache or moderate pain.   Past Week at Unknown time  . omeprazole (PRILOSEC) 20 MG capsule Take 20 mg by mouth daily.   03/12/2016 at Unknown time   Scheduled:  . aspirin EC  81 mg Oral Daily  . bisacodyl  10 mg Oral Daily   Or  . bisacodyl  10 mg Rectal Daily  . docusate sodium  200 mg Oral BID  . furosemide  40 mg Oral BID  . lisinopril  2.5 mg Oral Daily  . metoprolol tartrate  25 mg Oral BID  . mexiletine  300 mg Oral Q12H  . pantoprazole  40 mg Oral Daily  . potassium chloride  20 mEq Oral BID  . sodium chloride flush  10-40 mL Intracatheter Q12H  . sodium chloride flush  3 mL Intravenous Q12H  . sodium chloride flush  3 mL Intravenous Q12H  . Warfarin - Pharmacist Dosing Inpatient   Does not apply q1800    Assessment:  82yo male underwent mitral valve repair for severe MR on 10/17, started Coumadin per TCTS on 10/18 post-op, INR has been increasing slowl, now INR at goal 2.14 10/25 pharmacy asked to continue Coumadin dosing.  Goal of Therapy:  INR 2-3   Plan:  Warfarin 5mg  daily  Daily CBC, protime  Leota Sauers Pharm.D. CPP, BCPS Clinical Pharmacist [phone] 03/28/2016 8:41 AM

## 2016-03-28 NOTE — Progress Notes (Signed)
Patient arrived to 2W14. Alert and Oriented x 4 and VSS. Placed on telemetry and CCMD notified. Patient oriented to room and call light placed within reach with no questions or concerns at this time.

## 2016-03-28 NOTE — Progress Notes (Signed)
Patient Name: Roy Palmer Date of Encounter: 03/28/2016     Principal Problem:   VT (ventricular tachycardia) (HCC) Active Problems:   Severe mitral regurgitation   Near syncope -- cardiogenic   Symptomatic PVCs   Incidental pulmonary nodule   AAA (abdominal aortic aneurysm) without rupture (HCC)   Thoracic aortic aneurysm (HCC)   Vasovagal syncope   Mild CAD   Essential hypertension   CKD (chronic kidney disease), stage III   Hypertensive heart and kidney disease   Hyperlipidemia   S/P minimally invasive mitral valve repair    SUBJECTIVE  No chest pain. Feels well. Did not feel sensation of heart racing  CURRENT MEDS . aspirin EC  81 mg Oral Daily  . bisacodyl  10 mg Oral Daily   Or  . bisacodyl  10 mg Rectal Daily  . docusate sodium  200 mg Oral BID  . furosemide  40 mg Oral BID  . lisinopril  2.5 mg Oral Daily  . metoprolol tartrate  25 mg Oral BID  . mexiletine  300 mg Oral Q12H  . pantoprazole  40 mg Oral Daily  . potassium chloride  20 mEq Oral BID  . sodium chloride flush  10-40 mL Intracatheter Q12H  . sodium chloride flush  3 mL Intravenous Q12H  . sodium chloride flush  3 mL Intravenous Q12H  . warfarin  5 mg Oral q1800  . Warfarin - Pharmacist Dosing Inpatient   Does not apply q1800    OBJECTIVE  Vitals:   03/28/16 0600 03/28/16 0700 03/28/16 0709 03/28/16 1033  BP:  110/60  95/61  Pulse:  80  (!) 108  Resp: 12 16  16   Temp:   98.7 F (37.1 C) 98 F (36.7 C)  TempSrc:   Oral   SpO2:  92%  92%  Weight:      Height:        Intake/Output Summary (Last 24 hours) at 03/28/16 1226 Last data filed at 03/28/16 1000  Gross per 24 hour  Intake              640 ml  Output             1800 ml  Net            -1160 ml   Filed Weights   03/26/16 0600 03/27/16 0500 03/28/16 0500  Weight: 189 lb 9.5 oz (86 kg) 184 lb 4.9 oz (83.6 kg) 181 lb 10.5 oz (82.4 kg)    PHYSICAL EXAM  General: Pleasant, NAD. Neuro: Alert and oriented X 3. Moves  all extremities spontaneously. Psych: Normal affect. HEENT:  Normal  Neck: Supple without bruits or JVD. Lungs:  Resp regular and unlabored, CTA. Heart: RRR no s3, s4, or murmurs. Abdomen: Soft, non-tender, non-distended, BS + x 4.  Extremities: No clubbing, cyanosis or edema. DP/PT/Radials 2+ and equal bilaterally.  Accessory Clinical Findings  CBC  Recent Labs  03/27/16 1649  WBC 9.3  HGB 12.1*  HCT 36.3*  MCV 94.3  PLT 158   Basic Metabolic Panel  Recent Labs  03/26/16 0437 03/27/16 0356 03/27/16 1649  NA 133* 137  --   K 4.8 4.4  --   CL 99* 101  --   CO2 27 28  --   GLUCOSE 96 92  --   BUN 20 17  --   CREATININE 1.27* 1.26* 1.30*  CALCIUM 8.1* 8.2*  --   MG  --  2.3  --  Liver Function Tests No results for input(s): AST, ALT, ALKPHOS, BILITOT, PROT, ALBUMIN in the last 72 hours. No results for input(s): LIPASE, AMYLASE in the last 72 hours. Cardiac Enzymes No results for input(s): CKTOTAL, CKMB, CKMBINDEX, TROPONINI in the last 72 hours. BNP Invalid input(s): POCBNP D-Dimer No results for input(s): DDIMER in the last 72 hours. Hemoglobin A1C No results for input(s): HGBA1C in the last 72 hours. Fasting Lipid Panel No results for input(s): CHOL, HDL, LDLCALC, TRIG, CHOLHDL, LDLDIRECT in the last 72 hours. Thyroid Function Tests No results for input(s): TSH, T4TOTAL, T3FREE, THYROIDAB in the last 72 hours.  Invalid input(s): FREET3  TELE  Nsr, NSVT, s/p ATP  Radiology/Studies  Dg Chest 2 View  Result Date: 03/28/2016 CLINICAL DATA:  82 year old male status post AICD placement. EXAM: CHEST  2 VIEW COMPARISON:  Chest radiograph dated 03/25/2016 FINDINGS: There has been interval placement of a left pectoral AICD device. There is hyperexpansion of the lungs. There has been interval clearance of the previously seen opacity in the right mid lung field. Small bilateral pleural effusions. Bibasilar subsegmental atelectatic changes noted. Pneumonia is not  excluded. Mild cardiomegaly. Left atrial appendage occlusive device. No acute osseous pathology. IMPRESSION: Interval placement of a left pectoral AICD device.  No pneumothorax. Small bilateral pleural effusions and right lung base subsegmental atelectatic changes. Pneumonia is not excluded. Electronically Signed   By: Elgie Collard M.D.   On: 03/28/2016 06:33   Dg Chest 2 View  Result Date: 03/15/2016 CLINICAL DATA:  CHF. Preoperative examination for mitral valve replacement. History of thoracic aortic aneurysm. EXAM: CHEST  2 VIEW COMPARISON:  Chest x-ray of February 29 2016 FINDINGS: The lungs are adequately inflated and clear. The heart and pulmonary vascularity are normal. There is faint calcification in the wall of the aortic arch. There is mild tortuosity of the descending thoracic aorta. There is no pleural effusion. The bony thorax exhibits no acute abnormality. IMPRESSION: There is no active cardiopulmonary disease. Aortic atherosclerosis. Electronically Signed   By: David  [place] M.D.   On: 03/15/2016 07:42   Dg Chest 2 View  Result Date: 02/29/2016 CLINICAL DATA:  Dizziness for 2 weeks, hypotension, recent cardiac catheterization EXAM: CHEST  2 VIEW COMPARISON:  Chest x-ray of 02/20/2016 FINDINGS: No active infiltrate or effusion is seen. Mediastinal and hilar contours are unremarkable. Mild cardiomegaly is stable. No acute bony abnormality is seen. IMPRESSION: No active cardiopulmonary disease. Electronically Signed   By: Dwyane Dee M.D.   On: 02/29/2016 11:47   Dg Chest Port 1 View  Result Date: 03/25/2016 CLINICAL DATA:  Atelectasis, shortness of breath EXAM: PORTABLE CHEST 1 VIEW COMPARISON:  03/22/2016 FINDINGS: Mild focal opacity in the right mid lung, likely reflecting right upper lobe atelectasis. Left basilar opacity, likely atelectasis, with small left pleural effusion. No frank interstitial edema. Interval removal of right apical chest tube.  No pneumothorax. Cardiomegaly.  Right subclavian catheter terminates in the right atrium, 2.5 cm below the cavoatrial junction. IMPRESSION: Suspected right upper lobe and left basilar atelectasis. Possible small left pleural effusion. Interval removal of right chest tube.  No pneumothorax is seen. Electronically Signed   By: Charline Bills M.D.   On: 03/25/2016 08:21   Dg Chest Port 1 View  Result Date: 03/22/2016 CLINICAL DATA:  Complications after pacing wires removal, at risk for respiratory distress. Central line has been placed. Pt states he has no chest complaints at this time. EXAM: PORTABLE CHEST 1 VIEW COMPARISON:  Chest x-ray dated 03/21/2016.  FINDINGS: Heart size and mediastinal contours are stable. Pacer pad overlies the left heart border. Mild central pulmonary vascular congestion and interstitial edema without overt alveolar pulmonary edema. No pleural effusion or pneumothorax seen. IMPRESSION: Cardiomegaly with central pulmonary vascular congestion mild interstitial edema suggesting mild CHF/volume overload. Perhaps mild additional atelectasis at the left lung base, but not convincing. No evidence of pneumonia. Electronically Signed   By: Bary Richard M.D.   On: 03/22/2016 12:00   Dg Chest Port 1 View  Result Date: 03/21/2016 CLINICAL DATA:  Atelectasis and shortness of breath EXAM: PORTABLE CHEST 1 VIEW COMPARISON:  03/20/16 FINDINGS: Cardiac shadow is enlarged but stable. Postoperative changes are again seen. The previously noted Swan-Ganz catheter has been removed. Right-sided thoracostomy catheter remains in place. No pneumothorax is seen. No significant infiltrate is noted. A pericardial drain remains in place. Left jugular central venous sheath is again noted and kinked at skin surface. Minimal right basilar atelectasis is noted. IMPRESSION: Postsurgical changes with tubes and lines as described above. Minimal right basilar atelectasis remains. Electronically Signed   By: Alcide Clever M.D.   On: 03/21/2016 08:00     Dg Chest Port 1 View  Result Date: 03/20/2016 CLINICAL DATA:  Chest tube.  Post CABG EXAM: PORTABLE CHEST 1 VIEW COMPARISON:  03/19/2016 FINDINGS: Endotracheal tube removed. Swan-Ganz catheter remains in the main pulmonary artery unchanged. Left atrial clip unchanged in position. Cardiac valve replacement. Right chest tube in place. No pneumothorax. No pleural effusion on the right. Bibasilar atelectasis with mild progression. Negative for heart failure or edema. IMPRESSION: Increased bibasilar atelectasis and decreased lung volume following extubation. Negative for pneumothorax.  Right chest tube remains in place. Electronically Signed   By: Marlan Palau M.D.   On: 03/20/2016 09:16   Dg Chest Port 1 View  Result Date: 03/19/2016 CLINICAL DATA:  82 year old male status post minimally invasive mitral valve repair. Initial encounter. EXAM: PORTABLE CHEST 1 VIEW COMPARISON:  03/15/2016 and earlier. FINDINGS: Portable AP semi upright view at 1443 hours. Endotracheal tube tip about 15 mm above the carina. Enteric tube courses to the abdomen, tip not included. Left IJ approach Swan-Ganz catheter, tip at the level of the main pulmonary outflow tract. Right side chest tube coursing to the right lung apex. A second right side approach tube courses along the level of the diaphragm terminating over the left heart border. Percutaneous type pacer wires. Prosthetic valve material projects over the central cardiac silhouette. Mediastinal contours appear stable. Lower lung volumes. Perihilar and mild basilar crowding of lung markings. No pneumothorax identified. No pulmonary edema. IMPRESSION: 1. Lines and tubes appear appropriately placed: Endotracheal tube tip about 15 mm above the carina. Left IJ approach Swan-Ganz catheter tip at the level of the main pulmonary outflow tract. 2. Lower lung volumes with atelectasis. No pneumothorax or pulmonary edema identified. Electronically Signed   By: Odessa Fleming M.D.   On:  03/19/2016 14:52   Ct Angio Chest Aorta W &/or Wo Contrast  Result Date: 03/12/2016 CLINICAL DATA:  Thoracic aortic aneurysm. EXAM: CT ANGIOGRAPHY CHEST, ABDOMEN AND PELVIS TECHNIQUE: Multidetector CT imaging through the chest, abdomen and pelvis was performed using the standard protocol during bolus administration of intravenous contrast. Multiplanar reconstructed images and MIPs were obtained and reviewed to evaluate the vascular anatomy. CONTRAST:  100 mL Isovue 370 COMPARISON:  None. FINDINGS: CTA CHEST FINDINGS Cardiovascular: Preferential opacification of the thoracic aorta. No evidence of thoracic aortic dissection. Distal aortic arch measures 3.5 cm in diameter. Ascending  thoracic aorta measures 3.5 cm in AP dimension at the level of the right main pulmonary artery. Descending thoracic aortic aneurysm measuring 3.1 cm in AP diameter at the level of the right main pulmonary artery. Mild thoracic aortic atherosclerosis. Mild coronary artery atherosclerosis in the LAD. Enlarged heart size. No pericardial effusion. Mediastinum/Nodes: No enlarged mediastinal, hilar, or axillary lymph nodes. Thyroid gland, trachea, and esophagus demonstrate no significant findings. Lungs/Pleura: 2.2 x 2.7 cm focal area of airspace disease at the right lung base which may reflect pneumonia versus atelectasis versus less likely neoplasm. No pleural effusion or pneumothorax. Musculoskeletal: No acute osseous abnormality. No lytic or sclerotic osseous lesion. Review of the MIP images confirms the above findings. CTA ABDOMEN AND PELVIS FINDINGS VASCULAR Aorta: Abdominal aortic atherosclerosis. Infrarenal abdominal aortic aneurysm aneurysm measuring 3.5 (AP) x 3.3 (transverse). Aneurysm measures 5.2 cm in length. The neck of the aneurysm is approximately 2.2 cm from the right renal artery origin. Right common iliac artery measures 1.7 cm in diameter. Left common iliac artery measures 1.4 cm in diameter. Mild atherosclerotic plaque  in bilateral common iliac arteries. Mild atherosclerotic plaque in bilateral external iliac arteries. Celiac: Patent. SMA: Patent. Renals: Patent right renal artery with minimal atherosclerotic plaque at the origin. Patent left renal artery with mild atherosclerotic plaque resulting in less than 50% focal stenosis. IMA: Patent with mild atherosclerotic plaque at the origin. Inflow: Patent without evidence of dissection, vasculitis or significant stenosis. Veins: No obvious venous abnormality within the limitations of this arterial phase study. Review of the MIP images confirms the above findings. NON-VASCULAR Hepatobiliary: No focal liver abnormality is seen. No gallstones, gallbladder wall thickening, or biliary dilatation. Pancreas: Unremarkable. No pancreatic ductal dilatation or surrounding inflammatory changes. Spleen: Normal in size without focal abnormality. Adrenals/Urinary Tract: Adrenal glands are unremarkable. Kidneys are normal, without renal calculi, focal lesion, or hydronephrosis. Bladder is unremarkable. Stomach/Bowel: Stomach is within normal limits. Appendix appears normal. No evidence of bowel wall thickening, distention, or inflammatory changes. Diverticulosis without evidence of diverticulitis. Lymphatic: No lymphadenopathy. Reproductive: Prostate is unremarkable. Other: No abdominal wall hernia or abnormality. No abdominopelvic ascites. Musculoskeletal: No acute osseous abnormality. No aggressive lytic or sclerotic osseous lesion. Osteoarthritis of bilateral sacroiliac joints. Degenerative disc disease with disc height loss at L5-S1 with bilateral facet arthropathy. Review of the MIP images confirms the above findings. IMPRESSION: 1. Distal aortic arch measures 3.5 cm in diameter. Recommend annual imaging followup by CTA or MRA. This recommendation follows 2010 ACCF/AHA/AATS/ACR/ASA/SCA/SCAI/SIR/STS/SVM Guidelines for the Diagnosis and Management of Patients with Thoracic Aortic Disease.  Circulation.2010; 121: [id number] 2. Infrarenal abdominal aortic aneurysm measuring 3.5 x 3.3 cm. Recommend followup by ultrasound in 2 years. This recommendation follows ACR consensus guidelines: White Paper of the ACR Incidental Findings Committee II on Vascular Findings. J Am Coll Radiol 2013; 10:789-794. 3. Diverticulosis without evidence of diverticulitis. 4. 2.2 x 2.7 cm focal area of airspace disease at the right lung base which may reflect pneumonia versus atelectasis versus less likely neoplasm. Correlate with laboratory values. Followup CT chest is recommended in 4-6 weeks following trial of antibiotic therapy to ensure resolution and exclude underlying malignancy. 5.  Aortic Atherosclerosis (ICD10-170.0) Electronically Signed   By: Elige KoHetal  Patel   On: 03/12/2016 15:48   Ct Angio Abd/pel W/ And/or W/o  Result Date: 03/12/2016 CLINICAL DATA:  Thoracic aortic aneurysm. EXAM: CT ANGIOGRAPHY CHEST, ABDOMEN AND PELVIS TECHNIQUE: Multidetector CT imaging through the chest, abdomen and pelvis was performed using the standard protocol during bolus administration of  intravenous contrast. Multiplanar reconstructed images and MIPs were obtained and reviewed to evaluate the vascular anatomy. CONTRAST:  100 mL Isovue 370 COMPARISON:  None. FINDINGS: CTA CHEST FINDINGS Cardiovascular: Preferential opacification of the thoracic aorta. No evidence of thoracic aortic dissection. Distal aortic arch measures 3.5 cm in diameter. Ascending thoracic aorta measures 3.5 cm in AP dimension at the level of the right main pulmonary artery. Descending thoracic aortic aneurysm measuring 3.1 cm in AP diameter at the level of the right main pulmonary artery. Mild thoracic aortic atherosclerosis. Mild coronary artery atherosclerosis in the LAD. Enlarged heart size. No pericardial effusion. Mediastinum/Nodes: No enlarged mediastinal, hilar, or axillary lymph nodes. Thyroid gland, trachea, and esophagus demonstrate no significant  findings. Lungs/Pleura: 2.2 x 2.7 cm focal area of airspace disease at the right lung base which may reflect pneumonia versus atelectasis versus less likely neoplasm. No pleural effusion or pneumothorax. Musculoskeletal: No acute osseous abnormality. No lytic or sclerotic osseous lesion. Review of the MIP images confirms the above findings. CTA ABDOMEN AND PELVIS FINDINGS VASCULAR Aorta: Abdominal aortic atherosclerosis. Infrarenal abdominal aortic aneurysm aneurysm measuring 3.5 (AP) x 3.3 (transverse). Aneurysm measures 5.2 cm in length. The neck of the aneurysm is approximately 2.2 cm from the right renal artery origin. Right common iliac artery measures 1.7 cm in diameter. Left common iliac artery measures 1.4 cm in diameter. Mild atherosclerotic plaque in bilateral common iliac arteries. Mild atherosclerotic plaque in bilateral external iliac arteries. Celiac: Patent. SMA: Patent. Renals: Patent right renal artery with minimal atherosclerotic plaque at the origin. Patent left renal artery with mild atherosclerotic plaque resulting in less than 50% focal stenosis. IMA: Patent with mild atherosclerotic plaque at the origin. Inflow: Patent without evidence of dissection, vasculitis or significant stenosis. Veins: No obvious venous abnormality within the limitations of this arterial phase study. Review of the MIP images confirms the above findings. NON-VASCULAR Hepatobiliary: No focal liver abnormality is seen. No gallstones, gallbladder wall thickening, or biliary dilatation. Pancreas: Unremarkable. No pancreatic ductal dilatation or surrounding inflammatory changes. Spleen: Normal in size without focal abnormality. Adrenals/Urinary Tract: Adrenal glands are unremarkable. Kidneys are normal, without renal calculi, focal lesion, or hydronephrosis. Bladder is unremarkable. Stomach/Bowel: Stomach is within normal limits. Appendix appears normal. No evidence of bowel wall thickening, distention, or inflammatory  changes. Diverticulosis without evidence of diverticulitis. Lymphatic: No lymphadenopathy. Reproductive: Prostate is unremarkable. Other: No abdominal wall hernia or abnormality. No abdominopelvic ascites. Musculoskeletal: No acute osseous abnormality. No aggressive lytic or sclerotic osseous lesion. Osteoarthritis of bilateral sacroiliac joints. Degenerative disc disease with disc height loss at L5-S1 with bilateral facet arthropathy. Review of the MIP images confirms the above findings. IMPRESSION: 1. Distal aortic arch measures 3.5 cm in diameter. Recommend annual imaging followup by CTA or MRA. This recommendation follows 2010 ACCF/AHA/AATS/ACR/ASA/SCA/SCAI/SIR/STS/SVM Guidelines for the Diagnosis and Management of Patients with Thoracic Aortic Disease. Circulation.2010; 121: W098-J191 2. Infrarenal abdominal aortic aneurysm measuring 3.5 x 3.3 cm. Recommend followup by ultrasound in 2 years. This recommendation follows ACR consensus guidelines: White Paper of the ACR Incidental Findings Committee II on Vascular Findings. J Am Coll Radiol 2013; 10:789-794. 3. Diverticulosis without evidence of diverticulitis. 4. 2.2 x 2.7 cm focal area of airspace disease at the right lung base which may reflect pneumonia versus atelectasis versus less likely neoplasm. Correlate with laboratory values. Followup CT chest is recommended in 4-6 weeks following trial of antibiotic therapy to ensure resolution and exclude underlying malignancy. 5.  Aortic Atherosclerosis (ICD10-170.0) Electronically Signed   By: Alan Ripper  Patel   On: 03/12/2016 15:48    ASSESSMENT AND PLAN  1. VT - he is stable after ICD. He has had recurrent VT. Successful ATP. Continue mexilitine 300 bid. If quiet today, he could go home tomorrow. If recurrent VT, would add sotalol 160 daily or 80 bid 2. ICD - his device is working normally.  3. S/p MV repair -stable.  4. Disp. - the patient is now on our service as his only problem is the VT/ICD. We will dc  home tomorrow if stable. If more VT, start sotalol in conjunction with mexitil.  Sharlot Gowda Taylor,M.D.  03/28/2016 12:26 PMPatient ID: Roy Palmer, male   DOB: May 07, 1930, 82 y.o.   MRN: [id number]

## 2016-03-28 NOTE — Progress Notes (Signed)
      [address]       [name] [address]             [phone]        CARDIOTHORACIC SURGERY PROGRESS NOTE   R1 Day Post-Op Procedure(s) (LRB): ICD Implant Dual Chamber (N/A)  Subjective: Feels fine.  Had 5 episodes VT last night, all pace-terminated by ICD  Objective: Vital signs: BP Readings from Last 1 Encounters:  03/28/16 110/60   Pulse Readings from Last 1 Encounters:  03/28/16 80   Resp Readings from Last 1 Encounters:  03/28/16 16   Temp Readings from Last 1 Encounters:  03/28/16 98.7 F (37.1 C) (Oral)    Hemodynamics:    Physical Exam:  Rhythm:   sinus  Breath sounds: clear  Heart sounds:  RRR w/out murmur  Incisions:  Clean and dry  Abdomen:  Soft, non-distended, non-tender  Extremities:  Warm, well-perfused    Intake/Output from previous day: 10/25 0701 - 10/26 0700 In: 920 [P.O.:620; I.V.:200; IV Piggyback:100] Out: 1801 [Urine:1800; Stool:1] Intake/Output this shift: No intake/output data recorded.  Lab Results:  CBC: Recent Labs  03/27/16 1649  WBC 9.3  HGB 12.1*  HCT 36.3*  PLT 158    BMET:  Recent Labs  03/26/16 0437 03/27/16 0356 03/27/16 1649  NA 133* 137  --   K 4.8 4.4  --   CL 99* 101  --   CO2 27 28  --   GLUCOSE 96 92  --   BUN 20 17  --   CREATININE 1.27* 1.26* 1.30*  CALCIUM 8.1* 8.2*  --      PT/INR:   Recent Labs  03/28/16 0233  LABPROT 24.2*  INR 2.14    CBG (last 3)  No results for input(s): GLUCAP in the last 72 hours.  ABG    Component Value Date/Time   PHART 7.416 03/19/2016 2153   PCO2ART 29.9 (L) 03/19/2016 2153   PO2ART 108.0 03/19/2016 2153   HCO3 19.3 (L) 03/19/2016 2153   TCO2 24 03/22/2016 1711   ACIDBASEDEF 4.0 (H) 03/19/2016 2153   O2SAT 98.0 03/19/2016 2153    CXR: CHEST  2 VIEW  COMPARISON:  Chest radiograph dated 03/25/2016  FINDINGS: There has been interval placement of a left pectoral AICD device. There is hyperexpansion of the lungs. There  has been interval clearance of the previously seen opacity in the right mid lung field. Small bilateral pleural effusions. Bibasilar subsegmental atelectatic changes noted. Pneumonia is not excluded. Mild cardiomegaly. Left atrial appendage occlusive device. No acute osseous pathology.  IMPRESSION: Interval placement of a left pectoral AICD device.  No pneumothorax.  Small bilateral pleural effusions and right lung base subsegmental atelectatic changes. Pneumonia is not excluded.   Electronically Signed   By: Elgie Collard M.D.   On: 03/28/2016 06:33  Assessment/Plan: S/P Procedure(s) (LRB): ICD Implant Dual Chamber (N/A)  Doing well from standpoint of previous mitral valve repair Continues to have VT Management per EPS  [name], MD 03/28/2016 7:42 AM

## 2016-03-28 NOTE — Care Management Important Message (Signed)
Important Message  Patient Details  Name: Roy Palmer MRN: [id number] Date of Birth: [birth year]   Medicare Important Message Given:  Yes    Roy Palmer 03/28/2016, 9:20 AM

## 2016-03-28 NOTE — Progress Notes (Signed)
CARDIAC REHAB PHASE I   PRE:  Rate/Rhythm: 78 SR     BP: sitting 100/60    SaO2: 95 RA  MODE:  Ambulation: 550 ft   POST:  Rate/Rhythm: 85 SR    BP: sitting 106/70     SaO2: 95 RA  Pt c/o feeling very SOB upon standing up as he has been practicing 8-10x in room. Had pt slowly get up, taking breaks sitting on EOB and standing and he did not have this feeling. Able to walk with RW, steady. No c/o, felt good. HR stable. To recliner. Son present in room, gave pt permission to walk with family. Discussed ICD precautions on left side. [id number]   Harriet Masson CES, ACSM 03/28/2016 2:06 PM

## 2016-03-29 LAB — BASIC METABOLIC PANEL
Anion gap: 6 (ref 5–15)
BUN: 23 mg/dL — ABNORMAL HIGH (ref 6–20)
CO2: 26 mmol/L (ref 22–32)
Calcium: 8.2 mg/dL — ABNORMAL LOW (ref 8.9–10.3)
Chloride: 104 mmol/L (ref 101–111)
Creatinine, Ser: 1.39 mg/dL — ABNORMAL HIGH (ref 0.61–1.24)
GFR calc Af Amer: 52 mL/min — ABNORMAL LOW (ref >60)
GFR calc non Af Amer: 45 mL/min — ABNORMAL LOW (ref >60)
Glucose, Bld: 109 mg/dL — ABNORMAL HIGH (ref 65–99)
Potassium: 4.5 mmol/L (ref 3.5–5.1)
Sodium: 136 mmol/L (ref 135–145)

## 2016-03-29 LAB — MAGNESIUM: Magnesium: 2.2 mg/dL (ref 1.7–2.4)

## 2016-03-29 LAB — PROTIME-INR
INR: 2.45
Prothrombin Time: 27.1 seconds — ABNORMAL HIGH (ref 11.4–15.2)

## 2016-03-29 MED ORDER — WARFARIN SODIUM 5 MG PO TABS: 5.0000 mg | ORAL_TABLET | Freq: Every day | ORAL | 3 refills | Status: DC

## 2016-03-29 MED ORDER — METOPROLOL TARTRATE 25 MG PO TABS: 25.0000 mg | ORAL_TABLET | Freq: Two times a day (BID) | ORAL | 3 refills | Status: DC

## 2016-03-29 MED ORDER — FUROSEMIDE 40 MG PO TABS: 40.0000 mg | ORAL_TABLET | Freq: Two times a day (BID) | ORAL | 3 refills | Status: DC

## 2016-03-29 MED ORDER — COUMADIN BOOK
Freq: Once | Status: AC
  Administered 2016-03-29: 13:00:00
  Filled 2016-03-29: qty 1

## 2016-03-29 MED ORDER — POTASSIUM CHLORIDE CRYS ER 20 MEQ PO TBCR: 20.0000 meq | EXTENDED_RELEASE_TABLET | Freq: Two times a day (BID) | ORAL | 3 refills | Status: DC

## 2016-03-29 MED ORDER — MEXILETINE HCL 150 MG PO CAPS: 300.0000 mg | ORAL_CAPSULE | Freq: Two times a day (BID) | ORAL | 3 refills | Status: DC

## 2016-03-29 MED ORDER — LISINOPRIL 2.5 MG PO TABS: 2.5000 mg | ORAL_TABLET | Freq: Every day | ORAL | 3 refills | Status: DC

## 2016-03-29 MED ORDER — WARFARIN VIDEO: Freq: Once | Status: DC

## 2016-03-29 NOTE — Progress Notes (Signed)
ANTICOAGULATION CONSULT NOTE - Follow Up Consult  Pharmacy Consult for Coumadin Indication: mitral valve repair  Allergies  Allergen Reactions  . Amiodarone Other (See Comments)    Terrible dreams/nightmares  . Lyrica [Pregabalin] Nausea And Vomiting    Patient Measurements: Height: 5\' 11"  (180.3 cm) Weight: 177 lb 14.4 oz (80.7 kg) IBW/kg (Calculated) : 75.3  Vital Signs: Temp: 98.5 F (36.9 C) (10/27 0430) Temp Source: Oral (10/27 0430) BP: 127/68 (10/27 0430) Pulse Rate: 80 (10/27 0430)  Labs:  Recent Labs  03/27/16 0356 03/27/16 1649 03/28/16 0233 03/29/16 0229  HGB  --  12.1*  --   --   HCT  --  36.3*  --   --   PLT  --  158  --   --   LABPROT 20.1*  --  24.2* 27.1*  INR 1.69  --  2.14 2.45  CREATININE 1.26* 1.30*  --  1.39*    Estimated Creatinine Clearance: 42.1 mL/min (by C-G formula based on SCr of 1.39 mg/dL (H)).   Medical History: Past Medical History:  Diagnosis Date  . AAA (abdominal aortic aneurysm) without rupture (HCC) 03/12/2016   Small - measured 3.3 x 3.5 cm by CTA  . CKD (chronic kidney disease) stage 3, GFR 30-59 ml/min   . Essential hypertension   . Hyperlipidemia   . Hypertensive kidney disease   . Incidental pulmonary nodule 03/12/2016   Vague opacity right lung base requires radiographic follow up - index of suspicion is LOW  . Near syncope    Cardiogenic, related to an SVT and severe MR  . Non-sustained ventricular tachycardia (HCC)   . S/P minimally invasive mitral valve repair 03/19/2016   Complex valvuloplasty including triangular resection of flail segment of posterior leaflet, chordal transposition x1, artificial Gore-tex neochord placement x4 and 34 mm Sorin Memo 3D ring annuloplasty via right mini thoracotomy approach with clipping of LA appendage  . Severe mitral regurgitation by prior echocardiogram 02/23/2016   Confirmed by TEE  . Syncope 03/15/2016  . Thoracic aortic aneurysm (HCC) 03/12/2016   Very very mild  fusiform enlargement of distal transverse and proximal descending thoracic aorta noted on CTA  . Trigeminal neuralgia     Medications:  Prescriptions Prior to Admission  Medication Sig Dispense Refill Last Dose  . aspirin EC 81 MG tablet Take 1 tablet (81 mg total) by mouth daily.   03/12/2016 at Unknown time  . ibuprofen (ADVIL,MOTRIN) 200 MG tablet Take 200-400 mg by mouth every 6 (six) hours as needed for headache or moderate pain.   Past Week at Unknown time  . omeprazole (PRILOSEC) 20 MG capsule Take 20 mg by mouth daily.   03/12/2016 at Unknown time   Scheduled:  . aspirin EC  81 mg Oral Daily  . bisacodyl  10 mg Oral Daily   Or  . bisacodyl  10 mg Rectal Daily  . docusate sodium  200 mg Oral BID  . furosemide  40 mg Oral BID  . lisinopril  2.5 mg Oral Daily  . metoprolol tartrate  25 mg Oral BID  . mexiletine  300 mg Oral Q12H  . pantoprazole  40 mg Oral Daily  . potassium chloride  20 mEq Oral BID  . sodium chloride flush  10-40 mL Intracatheter Q12H  . sodium chloride flush  3 mL Intravenous Q12H  . sodium chloride flush  3 mL Intravenous Q12H  . warfarin  5 mg Oral q1800  . Warfarin - Pharmacist Dosing Inpatient  Does not apply q1800    Assessment: 82yo male s/p mitral valve repair for severe MR on 10/17, started on Coumadin per TCTS on 10/18 post-op. Pharmacy consulted to continue Coumadin dosing on 10/25. INR has been increasing slowly, now up 2.14>2.45. Hg 12.1, plt wnl, no bleed documented.  Goal of Therapy:  INR 2-3   Plan:  Warfarin 5mg  daily  Daily INR, monitor for s/sx bleeding   Babs BertinHaley Felix Pratt, PharmD, BCPS Clinical Pharmacist 03/29/2016 11:15 AM

## 2016-03-29 NOTE — Discharge Summary (Signed)
DISCHARGE SUMMARY    Patient ID: Roy Palmer,  MRN: [id number], DOB/AGE: 80/08/1929 80 y.o.  Admit date: 03/12/2016 Discharge date: 03/29/2016  Primary Care Physician: Gabriel Cirriheryl Wicker, NP Primary Cardiologist: Dr. Okey DupreEnd Cardiothoracic surgeon: Dr. Cornelius Moraswen Electrophysiologist: Dr. Ladona Ridgelaylor  Primary Discharge Diagnosis:  1. Severe MR     S/p Minimally invasive MV repair and LAA clipping on 03/19/16 (Dr. Cornelius Moraswen) 2. Syncope 3. VT     S/p ICD implant 03/27/16 (Dr. Ladona Ridgelaylor)  Secondary Discharge Diagnosis:  1. CRI (stage III) 2. HLD  Allergies  Allergen Reactions  . Amiodarone Other (See Comments)    Terrible dreams/nightmares  . Lyrica [Pregabalin] Nausea And Vomiting     Procedures This Admission:  1. Minimally-Invasive Mitral Valve Repair, 03/19/16, Dr. Cornelius Moraswen             Complex valvuloplasty including triangular resection of flail segment of posterior leaflet             Chordal transposition x1             Artificial Gore-tex neochord placement x4             Sorin Memo 3D Ring Annuloplasty (size 34mm, catalog # [id number], serial # [id number])             Clipping of left atrial appendage  2.  Implantation of a MDT dual chamber ICD on 03/27/16 by Dr Ladona Ridgelaylor.  The patient received aMedtronic (serial Number [id number]) ICD, with Metronic 5076 (serial # [id number] ) right atrial lead and a Medtronic (serial number [id number]) right ventricular defibrillator lead.  DFT testing with successfully defibrillated to sinus rhythm with a single 20 joules shock. There were no immediate post procedure complications. CXR on 03/28/16 demonstrated no pneumothorax status post device implantation.   Brief HPI: Roy Palmer is a 82 y.o. male with PMHx of severe MR (flail P2) pending surgical intervention, CRI (stage III), HTN?, HLD, known PVCs [facility] with worsening symptoms of recurrence of his near syncopal episodes where he feels a tightness in his chest and significant  dizziness with the idea of expediting his already planned MV surgery.  Hospital Course:  The patient was admitted with observed and known for him very frequent PVCs and his known severe MR. EP was consulted to the case given his symptoms, PVCs as well as NSVT episodes (intolerant of amiodarone with hallucinations) observed and baseline SB in the 50's for possible PPM implant pre-operatively, though not felt indicated at that time and MV surgery planned for the 17th with the idea of discharging to home and return for surgery.  The patient had a syncopal event during his PFT testing thought most likely to have been vagally/hyperventalation mediated with bradycardia, given this was decided with CTS service to keep him inpatient until his surgical date.  he was observed without any other significant events and underwent his PM repair and LAA clipping as noted above on 03/19/16.   He had the usual/expected post-op course until 03/22/16 when he developed a tachycardia/hypotension initially thought to be SVT and cardioverted, an echo was done and r/o tamponade or pericardial effusion.  EP was recalled and felt the tachycardia was VT and IV amiodarone was continued with less VT unfortunately the patient had recurrent hallucinations and unable to continue.  Given his advanced age was preferred to be able to avoid ICD implant and was monitored further.  He had recurrent hemodynamically unstable VT requiring DCCV and lidocaine/mexiletine.  He continued to have episodes of VT that were stopped with lidocaine boluses and on 03/07/16 underwent ICD inplant as described above.  The device was interrogated and found to be functioning normally.  CXR was obtained and demonstrated no pneumothorax status post device implantation.  He had more VT terminated with ATP and his mexiletine up-titrated.  He has done well in the last 24 hours without further VT.  Wound care for his MV surgery was discussed with the patient and instructions  written by CTS service as well as his follow up has been arranged by them.  No particular home needs were felt necessary and he has been cleared for discharge by CTS service this morning.  Arm mobility, and restrictions were reviewed with the patient in regards to his ICD, he has [place] law driving restriction of 6 months given hemodynamically unstable VT, the patient was made aware of this.  The patient declined any need for pain management/medication.  We will continue his current regime outpatient otherwise.  The patient was seen and examined by Dr. Ladona Ridgel and considered stable for discharge to home with the usual post ICD follow up, these have been arranged as well.  He has a coumadin clinic appointment for  Monday.   Physical Exam: Vitals:   03/28/16 1400 03/28/16 1512 03/28/16 2000 03/29/16 0430  BP: 95/66 111/69 118/61 127/68  Pulse:  76 86 80  Resp:  18 18 18   Temp:   98.1 F (36.7 C) 98.5 F (36.9 C)  TempSrc:   Oral Oral  SpO2:  97% 97% 96%  Weight:    177 lb 14.4 oz (80.7 kg)  Height:        GEN- The patient is well appearing, alert and oriented x 3 today.   HEENT: normocephalic, atraumatic; sclera clear, conjunctiva pink; hearing intact; oropharynx clear; neck supple, no JVP Lungs- Clear to ausculation bilaterally, normal work of breathing.  No wheezes, rales, rhonchi Heart- Regular rate and rhythm, no murmurs, rubs or gallops, PMI not laterally displaced GI- soft, non-tender, non-distended, bowel sounds present Extremities- no clubbing, cyanosis, or edema MS- no significant deformity or atrophy Skin- warm and dry, no rash or lesion, left chest without hematoma/ecchymosis Psych- euthymic mood, full affect Neuro- no gross deficits  Surgical sites/wounds are stable, dry, no hematomas, no bleeding   Labs:   Lab Results  Component Value Date   WBC 9.3 03/27/2016   HGB 12.1 (L) 03/27/2016   HCT 36.3 (L) 03/27/2016   MCV 94.3 03/27/2016   PLT 158 03/27/2016      Recent Labs Lab 03/29/16 0229  NA 136  K 4.5  CL 104  CO2 26  BUN 23*  CREATININE 1.39*  CALCIUM 8.2*  GLUCOSE 109*    Discharge Medications:    Medication List    STOP taking these medications   ibuprofen 200 MG tablet Commonly known as:  ADVIL,MOTRIN     TAKE these medications   aspirin EC 81 MG tablet Take 1 tablet (81 mg total) by mouth daily.   furosemide 40 MG tablet Commonly known as:  LASIX Take 1 tablet (40 mg total) by mouth 2 (two) times daily.   lisinopril 2.5 MG tablet Commonly known as:  PRINIVIL,ZESTRIL Take 1 tablet (2.5 mg total) by mouth daily.   metoprolol tartrate 25 MG tablet Commonly known as:  LOPRESSOR Take 1 tablet (25 mg total) by mouth 2 (two) times daily.   mexiletine 150 MG capsule Commonly known as:  MEXITIL Take  2 capsules (300 mg total) by mouth every 12 (twelve) hours.   omeprazole 20 MG capsule Commonly known as:  PRILOSEC Take 20 mg by mouth daily.   potassium chloride SA 20 MEQ tablet Commonly known as:  K-DUR,KLOR-CON Take 1 tablet (20 mEq total) by mouth 2 (two) times daily.   warfarin 5 MG tablet Commonly known as:  COUMADIN Take 1 tablet (5 mg total) by mouth daily at 6 PM.       Disposition:  Discharge Instructions    Amb Referral to Cardiac Rehabilitation    Complete by:  As directed    Diagnosis:  Valve Repair   Valve:  Mitral     Follow-up Information    [name], MD Follow up on 04/15/2016.   Specialty:  Cardiothoracic Surgery Why:  Appointment is at 3:00, please get CXR at 2:30 at [facility] imaging located on first floor of our office building Contact information: [address] [employer] [address] [place] [address] [phone]        [facility] Follow up on 04/10/2016.   Specialty:  Cardiology Why:  at 4:30PM for wound check  Contact information: [address] [place] [address] [phone]       Lewayne Bunting, MD Follow up  on 06/26/2016.   Specialty:  Cardiology Why:  at 9:45AM Contact information: [address] [place] [address] [phone]        [name], MD Follow up on 04/03/2016.   Specialty:  Cardiology Why:  Appointment time is at 11:15 am Contact information: [address] [place] [address] [phone]        [facility] Follow up on 04/01/2016.   Why:  9:30AM Contact information: [address] [place] [address] [phone]          Duration of Discharge Encounter: Greater than 30 minutes including physician time.  Norma Fredrickson, PA-C 03/29/2016 11:47 AM  EP Attending  Patient seen and examined. Agree with above. He has had no recurrent VT in 36 hours. Ok for DC home with usual followup. He will continue his current cardiac meds.  Leonia Reeves.D.

## 2016-03-29 NOTE — Progress Notes (Signed)
As of 0630 pt has not had any VT episodes overnight, VSS. Has been sleeping well. Will continue to monitor.

## 2016-03-29 NOTE — Progress Notes (Signed)
Pt has been walking independently with RW. Doing well. Ed completed with pt and son. Will send referral to [facility]. [id number] Ethelda Chick CES, ACSM 10:42 AM 03/29/2016

## 2016-03-29 NOTE — Progress Notes (Addendum)
      [address]       [name] [address]             [phone]        2 Days Post-Op Procedure(s) (LRB): ICD Implant Dual Chamber (N/A)  Subjective: He is just waking up this am. He is looking forward to going home.  Objective: Vital signs in last 24 hours: Temp:  [98 F (36.7 C)-98.5 F (36.9 C)] 98.5 F (36.9 C) (10/27 0430) Pulse Rate:  [76-108] 80 (10/27 0430) Cardiac Rhythm: Normal sinus rhythm;Bundle branch block;Heart block (10/26 1900) Resp:  [16-18] 18 (10/27 0430) BP: (95-127)/(61-69) 127/68 (10/27 0430) SpO2:  [92 %-97 %] 96 % (10/27 0430) Weight:  [177 lb 14.4 oz (80.7 kg)] 177 lb 14.4 oz (80.7 kg) (10/27 0430)  Pre op weight 83 kg Current Weight  03/29/16 177 lb 14.4 oz (80.7 kg)      Intake/Output from previous day: 10/26 0701 - 10/27 0700 In: 483 [P.O.:480; I.V.:3] Out: 250 [Urine:250]   Physical Exam:  Cardiovascular: RRR, no murmur Pulmonary: Clear to auscultation bilaterally Abdomen: Soft, non tender, bowel sounds present. Extremities: No lower extremity edema. Wounds: Clean and dry.  No erythema or signs of infection.  Lab Results: CBC: Recent Labs  03/27/16 1649  WBC 9.3  HGB 12.1*  HCT 36.3*  PLT 158   BMET:  Recent Labs  03/27/16 0356 03/27/16 1649 03/29/16 0229  NA 137  --  136  K 4.4  --  4.5  CL 101  --  104  CO2 28  --  26  GLUCOSE 92  --  109*  BUN 17  --  23*  CREATININE 1.26* 1.30* 1.39*  CALCIUM 8.2*  --  8.2*    PT/INR:  Lab Results  Component Value Date   INR 2.45 03/29/2016   INR 2.14 03/28/2016   INR 1.69 03/27/2016   ABG:  INR: Will add last result for INR, ABG once components are confirmed Will add last 4 CBG results once components are confirmed  Assessment/Plan:  1. CV - Previous VT. No VT last 24 hours. Maintaining SR in ton Lopressor 25 mg bid, Lisinopril 2.5 mg daily, Mexiletine 300 mg bid, and Coumadin. INR increased from 2.14 to 2.45. Likely home on Coumadin 5  mg. 2. Pulmonary-On room air. Encourage incentive spirometer. 3. Chronic diastolic CHF, volume overload post op-on Lasix 40 mg bid. 4. CKD(stage III)-creatinine remains relatively stable at 1.39 5. I spoke with EPS, and likely discharge today. Follow up and instructions per TCTS already done. From our point of view, ok to discharge.   Kaine Mcquillen MPA-C 03/29/2016,7:21 AM

## 2016-04-01 ENCOUNTER — Ambulatory Visit (INDEPENDENT_AMBULATORY_CARE_PROVIDER_SITE_OTHER): Payer: Medicare Other | Admitting: *Deleted

## 2016-04-01 DIAGNOSIS — I340 Nonrheumatic mitral (valve) insufficiency: Secondary | ICD-10-CM | POA: Diagnosis not present

## 2016-04-01 DIAGNOSIS — Z9889 Other specified postprocedural states: Secondary | ICD-10-CM

## 2016-04-01 DIAGNOSIS — Z5181 Encounter for therapeutic drug level monitoring: Secondary | ICD-10-CM

## 2016-04-01 DIAGNOSIS — I714 Abdominal aortic aneurysm, without rupture, unspecified: Secondary | ICD-10-CM

## 2016-04-01 LAB — POCT INR: INR: 1.2

## 2016-04-01 NOTE — Patient Instructions (Signed)

## 2016-04-02 NOTE — Progress Notes (Signed)
Follow-up Outpatient Visit Date: 04/03/2016  Chief Complaint: Hospital follow-up  HPI:  [name] is a 82 y.o. year-old male with history of severe mitral regurgitation status post mitral valve repair (03/2016), ventricular tachycardia status post ICD (03/2016), hypertension, hyperlipidemia, and chronic kidney disease, who presents for follow-up after lengthy hospitalization for mitral valve repair and management of postoperative VT. I first met the patient on 02/09/16 for evaluation of dizziness. At the time, a significant systolic murmur was noted as well as frequent PVCs on EKG. Subsequent evaluation, including transthoracic and transesophageal echocardiograms, revealed severe mitral regurgitation with flail posterior leaflet. Left and right heart catheterization showed mild, nonobstructive CAD and relatively normal left and right heart filling pressures with decreased cardiac index. Holter monitor was notable for nonsustained ventricular tachycardia and frequent PVCs. The patient was referred for outpatient cardiac surgery evaluation with Dr. Roxy Manns but subsequently had worsening dizziness and near syncope prompting ED visit and hospitalization. His mitral valve repair was expedited, which the patient tolerated well. Postoperatively, however, he experienced multiple episodes of sustained ventricular tachycardia requiring defibrillation and multiple antiarrhythmic agents. After consultation with electrophysiology, the patient was ultimately placed on a regimen of mexiletine and metoprolol with adequate suppression of his VT. He also went dual chamber ICD placement by Dr. Lovena Le.  Since leaving the hospital, [name] has been feeling relatively well with the exception of generalized fatigue. This typically begins after he takes his morning medications and then gradually resolves by the afternoon. He has not checked his blood pressure at home. He has not passed out. He denies chest pain, palpitations,  shortness of breath, orthopnea, PND, and edema. He is compliant with his medications. He has not been shocked by his ICD.  --------------------------------------------------------------------------------------------------  Cardiovascular History & Procedures: Cardiovascular Problems:  Severe mitral regurgitation status post repair (03/2016)  Frequent ventricular ectopy with sustained ventricular tachycardia status post ICD  Nonobstructive coronary artery disease  Risk Factors:  Age, male gender, hypertension, and hyperlipidemia  Cath/PCI:  LHC/RHC (02/23/16): Mild, nonobstructive CAD (30% ostial LMCA, 30% ostial LAD, 30% ostial ramus, and minimal luminal irregularities of dominant LCx. Normal right heart filling pressures. Decreased Fick cardiac output (CO 3.6 L/m, CI 1.8 L/m/m) in setting of frequent PVCs and severe MR.  CV Surgery:  Minimally invasive mitral valve repair (03/19/16, Dr. Roxy Manns)  EP Procedures and Devices:  24-hour Holter monitor (02/27/16): Predominantly sinus rhythm with average heart rate is 63 bpm (range 49-91 bpm). Longest RR interval 1.6 seconds. Frequent PVCs noted (11% burden) including frequent couplets and bigeminal cycles. 90 runs of NSVT were noted lasting up to 9 beats the maximum rate of 134 bpm. Rare SVT and supraventricular ectopy noted.  Dual chamber ICD (03/27/16, [employer])  Non-Invasive Evaluation(s):  TTE (02/14/16): Normal LV size with moderate LVH. EF mildly reduced at 50% with possible inferolateral hypokinesis. Moderate to severe MR noted with prolapse/flail of the posterior leaflet. Severe left atrial enlargement. Moderate TR.  TEE (02/23/16): Normal LV size and wall thickness with lower normal LVEF. Trivial AI. Severe, eccentric MR with reversal of pulmonary vein flow and flail posterior leaflet. No left atrial thrombus. Lipomatous hypertrophy of the septum. Mild to moderate TR.  Limited TTE (03/22/16): Normal LV size with moderate LVH. LVEF  40-45% with possible inferior hypokinesis. Trivial MR with mild left atrial enlargement. Normal RV size with moderately reduced systolic function. Mild TR. No pericardial effusion.  Recent CV Pertinent Labs: Lab Results  Component Value Date   CHOL 182 03/13/2016  CHOL 188 02/14/2016   HDL 33 (L) 03/13/2016   HDL 36 (L) 02/14/2016   LDLCALC 116 (H) 03/13/2016   LDLCALC 127 (H) 02/14/2016   TRIG 163 (H) 03/13/2016   CHOLHDL 5.5 03/13/2016   INR 1.2 04/01/2016   INR 2.45 03/29/2016   BNP 110.8 (H) 03/12/2016   K 4.5 03/29/2016   MG 2.2 03/29/2016   BUN 23 (H) 03/29/2016   BUN 19 02/27/2016   CREATININE 1.39 (H) 03/29/2016    Past medical and surgical history were reviewed and updated in EPIC.   Outpatient Encounter Prescriptions as of 04/03/2016  Medication Sig  . aspirin EC 81 MG tablet Take 1 tablet (81 mg total) by mouth daily.  . furosemide (LASIX) 40 MG tablet Take 1 tablet (40 mg total) by mouth 2 (two) times daily.  Marland Kitchen lisinopril (PRINIVIL,ZESTRIL) 2.5 MG tablet Take 1 tablet (2.5 mg total) by mouth daily.  . metoprolol tartrate (LOPRESSOR) 25 MG tablet Take 1 tablet (25 mg total) by mouth 2 (two) times daily.  Marland Kitchen mexiletine (MEXITIL) 150 MG capsule Take 2 capsules (300 mg total) by mouth every 12 (twelve) hours.  Marland Kitchen omeprazole (PRILOSEC) 20 MG capsule Take 20 mg by mouth daily.  . potassium chloride SA (K-DUR,KLOR-CON) 20 MEQ tablet Take 1 tablet (20 mEq total) by mouth 2 (two) times daily.  Marland Kitchen warfarin (COUMADIN) 5 MG tablet Take 1 tablet (5 mg total) by mouth daily at 6 PM.   No facility-administered encounter medications on file as of 04/03/2016.     Allergies: Amiodarone and Lyrica [pregabalin]  Social History   Social History  . Marital status: Married    Spouse name: N/A  . Number of children: N/A  . Years of education: N/A   Occupational History  . Not on file.   Social History Main Topics  . Smoking status: Never Smoker  . Smokeless tobacco: Never Used    . Alcohol use No  . Drug use: No  . Sexual activity: Not on file   Other Topics Concern  . Not on file   Social History Narrative  . No narrative on file    Family History  Problem Relation Age of Onset  . Hypertension Mother   . Stroke Mother   . Heart disease Father   . Heart attack Father   . COPD Neg Hx   . Cancer Neg Hx   . Diabetes Neg Hx     Review of Systems: A 12-system review of systems was performed and was negative except as noted in the HPI.  --------------------------------------------------------------------------------------------------  Physical Exam: BP 94/60 (BP Location: Right Arm, Patient Position: Sitting)   Pulse 71   Ht '5\' 11"'$  (1.803 m)   Wt 178 lb (80.7 kg)   BMI 24.83 kg/m   General:  Well-developed, well-nourished elderly man seated comfortably in the exam room. He is accompanied by his son. HEENT: No conjunctival pallor or scleral icterus.  Moist mucous membranes.  OP clear. Neck: Supple without lymphadenopathy, thyromegaly, JVD, or HJR. Lungs: Normal work of breathing.  Mildly diminished breath sounds at the bases. Otherwise, normal air movement without wheezes or crackles. Heart: Regular rate and rhythm without murmurs, rubs, or gallops.  Non-displaced PMI. Abd: Bowel sounds present.  Soft, NT/ND without hepatosplenomegaly Ext: No lower extremity edema.  Radial, PT, and DP pulses are 2+ bilaterally. Skin: warm and dry without rash. Right thoracotomy scar is well-healed. Left anterior chest ICD site is clean and covered with Steri-Strips.   EKG:  Sinus rhythm with first-degree AV block, right bundle branch block, and left anterior fascicular block (trifascicular block).  Lab Results  Component Value Date   WBC 9.3 03/27/2016   HGB 12.1 (L) 03/27/2016   HCT 36.3 (L) 03/27/2016   MCV 94.3 03/27/2016   PLT 158 03/27/2016    Lab Results  Component Value Date   NA 136 03/29/2016   K 4.5 03/29/2016   CL 104 03/29/2016   CO2 26  03/29/2016   BUN 23 (H) 03/29/2016   CREATININE 1.39 (H) 03/29/2016   GLUCOSE 109 (H) 03/29/2016   ALT 23 03/15/2016    Lab Results  Component Value Date   CHOL 182 03/13/2016   HDL 33 (L) 03/13/2016   LDLCALC 116 (H) 03/13/2016   TRIG 163 (H) 03/13/2016   CHOLHDL 5.5 03/13/2016    --------------------------------------------------------------------------------------------------  ASSESSMENT AND PLAN: Severe mitral regurgitation Patient has recovered relatively well from his recent mitral valve repair. No significant murmurs appreciated on exam today. Recent echocardiogram in the hospital also showed only trivial MR. Given his low blood pressure and fatigue, we will discontinue lisinopril and standing Lasix. Patient should take furosemide as needed for edema or weight gain.  Ventricular tachycardia The patient has been asymptomatic since leaving the hospital. EKG today does not show any ventricular ectopy. We will continue with mexiletine and metoprolol. The patient is scheduled to follow up in the device clinic next week and will continue his electrophysiology care with Drs. Lovena Le and Caryl Comes.  Hypertension Blood pressure today is borderline low on recheck (initially 80/60). I suspect some of this might be due to intravascular volume depletion and overmedication. The patient appears euvolemic to dry today. We will discontinue his standing Lasix. Though he was noted to have moderately reduced LV contraction on his postoperative echo, I am concerned by his fatigue and hypotension and will therefore discontinue lisinopril today.  Nonischemic cardiomyopathy Patient noted to have borderline low EF prior to mitral valve repair, which decreased to 40-45% following surgery. I'm hopeful that this will recover, now that his ventricular ectopy and MR are better controlled. Unfortunately, due to hypotension and fatigue, we will need to discontinue lisinopril for the time being. We will continue with  his current metoprolol dosing.   Follow-up: Return to clinic in 6 weeks.  Nelva Bush, MD 04/03/2016 11:31 AM

## 2016-04-03 ENCOUNTER — Encounter: Payer: Self-pay | Admitting: Internal Medicine

## 2016-04-03 ENCOUNTER — Ambulatory Visit (INDEPENDENT_AMBULATORY_CARE_PROVIDER_SITE_OTHER): Payer: Medicare Other | Admitting: Internal Medicine

## 2016-04-03 VITALS — BP 94/60 | HR 71 | Ht 71.0 in | Wt 178.0 lb

## 2016-04-03 DIAGNOSIS — I428 Other cardiomyopathies: Secondary | ICD-10-CM | POA: Diagnosis not present

## 2016-04-03 DIAGNOSIS — I472 Ventricular tachycardia, unspecified: Secondary | ICD-10-CM

## 2016-04-03 DIAGNOSIS — I10 Essential (primary) hypertension: Secondary | ICD-10-CM

## 2016-04-03 DIAGNOSIS — I340 Nonrheumatic mitral (valve) insufficiency: Secondary | ICD-10-CM

## 2016-04-03 MED ORDER — FUROSEMIDE 40 MG PO TABS: 40.0000 mg | ORAL_TABLET | Freq: Every day | ORAL | 3 refills | Status: DC | PRN

## 2016-04-03 NOTE — Patient Instructions (Addendum)
Medication Instructions:  Your physician has recommended you make the following change in your medication:  1-STOP Lisinopril 2-TAKE Lasix 40 mg by mouth as needed for swelling  Labwork: NONE  Testing/Procedures: NONE  Follow-Up: Your physician recommends that you schedule a follow-up appointment in: 6 weeks with Dr. Okey Dupre.   If you need a refill on your cardiac medications before your next appointment, please call your pharmacy.

## 2016-04-10 ENCOUNTER — Ambulatory Visit (INDEPENDENT_AMBULATORY_CARE_PROVIDER_SITE_OTHER): Payer: Medicare Other | Admitting: *Deleted

## 2016-04-10 DIAGNOSIS — Z5181 Encounter for therapeutic drug level monitoring: Secondary | ICD-10-CM

## 2016-04-10 DIAGNOSIS — Z9889 Other specified postprocedural states: Secondary | ICD-10-CM

## 2016-04-10 DIAGNOSIS — I428 Other cardiomyopathies: Secondary | ICD-10-CM

## 2016-04-10 DIAGNOSIS — I340 Nonrheumatic mitral (valve) insufficiency: Secondary | ICD-10-CM

## 2016-04-10 DIAGNOSIS — I714 Abdominal aortic aneurysm, without rupture, unspecified: Secondary | ICD-10-CM

## 2016-04-10 LAB — CUP PACEART INCLINIC DEVICE CHECK
Battery Remaining Longevity: 133 mo
Battery Voltage: 3.13 V
Brady Statistic AP VP Percent: 0.01 %
Brady Statistic AP VS Percent: 0.65 %
Brady Statistic AS VP Percent: 0.07 %
Brady Statistic AS VS Percent: 99.28 %
Brady Statistic RA Percent Paced: 0.66 %
Brady Statistic RV Percent Paced: 0.07 %
Date Time Interrogation Session: 20171108170817
HighPow Impedance: 61 Ohm
Implantable Lead Implant Date: 20171025
Implantable Lead Implant Date: 20171025
Implantable Lead Location: 753859
Implantable Lead Location: 753860
Implantable Lead Model: 5076
Implantable Pulse Generator Implant Date: 20171025
Lead Channel Impedance Value: 399 Ohm
Lead Channel Impedance Value: 456 Ohm
Lead Channel Impedance Value: 475 Ohm
Lead Channel Pacing Threshold Amplitude: 0.625 V
Lead Channel Pacing Threshold Amplitude: 1.125 V
Lead Channel Pacing Threshold Pulse Width: 0.4 ms
Lead Channel Pacing Threshold Pulse Width: 0.4 ms
Lead Channel Sensing Intrinsic Amplitude: 3.125 mV
Lead Channel Sensing Intrinsic Amplitude: 3.375 mV
Lead Channel Sensing Intrinsic Amplitude: 3.5 mV
Lead Channel Sensing Intrinsic Amplitude: 3.625 mV
Lead Channel Setting Pacing Amplitude: 3.5 V
Lead Channel Setting Pacing Amplitude: 3.5 V
Lead Channel Setting Pacing Pulse Width: 0.4 ms
Lead Channel Setting Sensing Sensitivity: 0.3 mV

## 2016-04-10 LAB — POCT INR: INR: 2.8

## 2016-04-10 NOTE — Progress Notes (Signed)
Wound check appointment. Steri-strips removed. Wound without redness or edema. Incision edges approximated, wound well healed. Stitch removed from mid-incision. Antibiotic ointment and bandage applied. Patient to call for redness, drainage, or swelling. ICD checked in office. Normal device function. Thresholds, sensing, and impedances consistent with implant measurements. Device programmed at 3.5V for extra safety margin until 3 month visit. Histogram distribution appropriate for patient and level of activity. No mode switches or ventricular arrhythmias noted. Patient educated about wound care, arm mobility, lifting restrictions, shock plan. ROV in 3 months with GT.

## 2016-04-12 ENCOUNTER — Other Ambulatory Visit: Payer: Self-pay | Admitting: Thoracic Surgery (Cardiothoracic Vascular Surgery)

## 2016-04-12 DIAGNOSIS — Z9889 Other specified postprocedural states: Secondary | ICD-10-CM

## 2016-04-15 ENCOUNTER — Ambulatory Visit (INDEPENDENT_AMBULATORY_CARE_PROVIDER_SITE_OTHER): Payer: Self-pay | Admitting: Thoracic Surgery (Cardiothoracic Vascular Surgery)

## 2016-04-15 ENCOUNTER — Encounter: Payer: Self-pay | Admitting: Thoracic Surgery (Cardiothoracic Vascular Surgery)

## 2016-04-15 ENCOUNTER — Ambulatory Visit
Admission: RE | Admit: 2016-04-15 | Discharge: 2016-04-15 | Disposition: A | Discharge status: Home / Self Care | Payer: Medicare Other | Source: Ambulatory Visit | Attending: Thoracic Surgery (Cardiothoracic Vascular Surgery) | Admitting: Thoracic Surgery (Cardiothoracic Vascular Surgery)

## 2016-04-15 VITALS — BP 116/77 | HR 90 | Resp 20 | Ht 71.0 in | Wt 178.0 lb

## 2016-04-15 DIAGNOSIS — I340 Nonrheumatic mitral (valve) insufficiency: Secondary | ICD-10-CM

## 2016-04-15 DIAGNOSIS — Z9889 Other specified postprocedural states: Secondary | ICD-10-CM

## 2016-04-15 MED ORDER — POTASSIUM CHLORIDE CRYS ER 20 MEQ PO TBCR: 20.0000 meq | EXTENDED_RELEASE_TABLET | Freq: Every day | ORAL | 3 refills | Status: DC | PRN

## 2016-04-15 NOTE — Progress Notes (Signed)
[address]       [place] [address]             [phone]     CARDIOTHORACIC SURGERY OFFICE NOTE  Referring Provider is End, Cristal Deer, MD PCP is Gabriel Cirri, NP   HPI:  Patient is an 82 year old male who returns to the office today for routine follow-up status post minimally invasive mitral valve repair on 03/19/2016 for severe symptomatic primary mitral regurgitation. He originally presented with dizzy spells and was found to have recurrent episodes of nonsustained ventricular tachycardia on outpatient Holter monitor. During his workup he was also diagnosed with mitral valve prolapse with large flail segment of the posterior leaflet and severe primary mitral regurgitation.  Prior to elective surgery patient was hospitalized acutely for a syncopal event. He had a second syncopal event while he was in the hospital prior to surgery that was felt secondary to vagal mediated bradycardia. He underwent uncomplicated mitral valve repair but began to experience recurrent episodes of ventricular tachycardia during his postoperative convalescence. He did not tolerate amiodarone and was treated medically using mexiletine. Despite this he continued to experience recurrent episodes of ventricular tachycardia. He ultimately underwent ICD placement 03/27/2016 by Dr. Ladona Ridgel.  He was discharged home 2 days later, and since then he has been seen in follow-up by Dr. Okey Dupre on 04/03/2016.  At that time his blood pressure was running slightly low so the patient's lisinopril was stopped and he was instructed to only take Lasix on as needed basis for fluid overload. His prothrombin time has been monitored and his Coumadin dose has been adjusted through the Coumadin clinic.  His most recent INR was 2.8 when it was checked on 04/10/2016. He returns to our office for routine follow-up today. He has been scheduled for another INR check later this week and he returns to see Dr. Okey Dupre for follow-up in  early December.  The patient reports that he is doing very well. His only complaint is that over the last week he feels as though he has developed "the shakes" which he feels is secondary to mexiletine. He has not had any palpitations and his defibrillator has not fired.  He denies any shortness of breath. He reports no significant pain related to his recent surgery or fibrillator implantation. He states that he has been fairly active physically but he admits that he hasn't been walking too much. He states that he does not plan to participate in outpatient cardiac rehabilitation program. He has not been taking Lasix but he continues to take potassium on a daily basis. The remainder of his review of systems unremarkable.   Current Outpatient Prescriptions  Medication Sig Dispense Refill  . aspirin EC 81 MG tablet Take 1 tablet (81 mg total) by mouth daily.    . furosemide (LASIX) 40 MG tablet Take 1 tablet (40 mg total) by mouth daily as needed for edema. 60 tablet 3  . metoprolol tartrate (LOPRESSOR) 25 MG tablet Take 1 tablet (25 mg total) by mouth 2 (two) times daily. 60 tablet 3  . mexiletine (MEXITIL) 150 MG capsule Take 2 capsules (300 mg total) by mouth every 12 (twelve) hours. 120 capsule 3  . omeprazole (PRILOSEC) 20 MG capsule Take 20 mg by mouth daily.    . potassium chloride SA (K-DUR,KLOR-CON) 20 MEQ tablet Take 1 tablet (20 mEq total) by mouth 2 (two) times daily. 60 tablet 3  . warfarin (COUMADIN) 5 MG tablet Take 1 tablet (  5 mg total) by mouth daily at 6 PM. 30 tablet 3   No current facility-administered medications for this visit.       Physical Exam:   BP 116/77 (BP Location: Right Arm, Patient Position: Sitting, Cuff Size: Normal)   Pulse 90   Resp 20   Ht 5\' 11"  (1.803 m)   Wt 178 lb (80.7 kg)   SpO2 98% Comment: RA  BMI 24.83 kg/m   General:  Well-appearing  Chest:   Clear to auscultation  CV:   Regular rate and rhythm without murmur  Incisions:  Healing  nicely  Abdomen:  Soft nontender  Extremities:  Warm and well-perfused  Diagnostic Tests:  Transthoracic Echocardiography  Patient:    Roy Palmer, Roy Palmer MR #:       [id number] Study Date: 03/22/2016 Gender:     M Age:        87 Height:     180.3 cm Weight:     90.7 kg BSA:        2.15 m^2 Pt. Status: Room:       2S16C   ATTENDING    Tressie Stalker, M.D.  REFERRING    Tressie Stalker, M.D.  ORDERING     VanTrigt, Peter  REFERRING    VanTrigt, Genelle Gather    Bryan Lemma, MD  SONOGRAPHER  [facility]  PERFORMING   [facility], Inpatient  cc:  ------------------------------------------------------------------- LV EF: 40% -   45%  ------------------------------------------------------------------- Indications:      Pericardial disease - tamponnade 423.0, suspected, pre-procedure.  ------------------------------------------------------------------- History:   PMH:  Cardiac arrest S/P mitral valve repair. CKD III. NSVT. Thrombocytopenia.  ------------------------------------------------------------------- Study Conclusions  - Left ventricle: The cavity size was normal. There was moderate   concentric hypertrophy. Systolic function was mildly to   moderately reduced. The estimated ejection fraction was in the   range of 40% to 45%. Possible inferior hypokinesis and   incoordinate septal motion. - Mitral valve: Annuloplasty ring in place. Trivial regurgitation   without significant stenosis. - Left atrium: The atrium was mildly dilated. - Right ventricle: Normal size with moderately reduced RV systolic   function. - Right atrium: The atrium was normal in size. - Pulmonary arteries: PA peak pressure: 41 mm Hg (S). - Systemic veins: The IVC measures <2.1 cm but does not collapse   >50%, suggesting an elevated RA pressure of 8 mmHg. - Pericardium, extracardiac: There was no pericardial effusion.   Features were not consistent with tamponade  physiology.  Impressions:  - Compared to a prior study in 02/2016, the LVEF is lower at 40-45%   with possible inferior wall hypokinesis and incoordinate septal   motion. There is no pericardial effusion or concern for   tamponade, however, there is likely moderate RV dysfunction and   mild pulmonary hypertension with an RVSP of 41 mmHg.  ------------------------------------------------------------------- Study data:  Comparison was made to the study of 02/23/2016.  Study status:  STAT.  Procedure:  The patient reported no pain pre or post test. Transthoracic echocardiography. Image quality was adequate.          Transthoracic echocardiography.  M-mode, limited 2D, limited spectral Doppler, and color Doppler.  Birthdate: Patient birthdate: 05/20/1930.  Age:  Patient is 82 yr old.  Sex: Gender: male.    BMI: 27.9 kg/m^2.  Blood pressure:     124/69 Patient status:  Inpatient.  Study date:  Study date: 03/22/2016. Study time: 12:01 PM.  Location:  ICU/CCU  -------------------------------------------------------------------  -------------------------------------------------------------------  Left ventricle:  The cavity size was normal. There was moderate concentric hypertrophy. Systolic function was mildly to moderately reduced. The estimated ejection fraction was in the range of 40% to 45%. Possible inferior hypokinesis and incoordinate septal motion.   ------------------------------------------------------------------- Aorta:  Aortic root: The aortic root was normal in size. Ascending aorta: The ascending aorta was normal in size.  ------------------------------------------------------------------- Mitral valve:  Annuloplasty ring in place. Trivial regurgitation without significant stenosis.  Doppler:     Mean gradient (D): 3 mm Hg.  ------------------------------------------------------------------- Left atrium:  The atrium was mildly  dilated.  ------------------------------------------------------------------- Right ventricle:  Normal size with moderately reduced RV systolic function.  ------------------------------------------------------------------- Tricuspid valve:   Doppler:  There was mild regurgitation.  ------------------------------------------------------------------- Pulmonary artery:   The main pulmonary artery was normal-sized.  ------------------------------------------------------------------- Right atrium:  The atrium was normal in size.  ------------------------------------------------------------------- Pericardium:  There was no pericardial effusion.  Doppler: Features were not consistent with tamponade physiology.  ------------------------------------------------------------------- Systemic veins:  The IVC measures <2.1 cm but does not collapse >50%, suggesting an elevated RA pressure of 8 mmHg.  ------------------------------------------------------------------- Measurements   Left ventricle                              Value        Reference  LV ID, ED, PLAX chordal             (H)     56.4  mm     43 - 52  LV ID, ES, PLAX chordal             (H)     45.6  mm     23 - 38  LV fx shortening, PLAX chordal      (L)     19    %      >=29  LV PW thickness, ED                         13.4  mm     ---------  IVS/LV PW ratio, ED                         1.03         <=1.3  LV end-diastolic volume, 1-p A2C            130   ml     ---------  LV end-systolic volume, 1-p A2C             67    ml     ---------  LV end-diastolic volume, 1-p A4C            197   ml     ---------  LV end-systolic volume, 1-p A4C             121   ml     ---------  LV ejection fraction, 1-p A4C               39    %      ---------  Stroke volume, 1-p A4C                      76    ml     ---------  LV end-diastolic volume/bsa, 1-p            92    ml/m^2 ---------  A4C  LV end-systolic volume/bsa, 1-p A4C          56    ml/m^2 ---------  Stroke volume/bsa, 1-p A4C                  35    ml/m^2 ---------  LV end-diastolic volume, 2-p                177   ml     ---------  LV end-systolic volume, 2-p                 100   ml     ---------  LV ejection fraction, 2-p                   43    %      ---------  Stroke volume, 2-p                          77    ml     ---------  LV end-diastolic volume/bsa, 2-p            82    ml/m^2 ---------  LV end-systolic volume/bsa, 2-p             47    ml/m^2 ---------  Stroke volume/bsa, 2-p                      35.7  ml/m^2 ---------    Ventricular septum                          Value        Reference  IVS thickness, ED                           13.8  mm     ---------    LVOT                                        Value        Reference  LVOT ID, S                                  23    mm     ---------  LVOT area                                   4.15  cm^2   ---------    Aorta                                       Value        Reference  Aortic root ID, ED                          38    mm     ---------    Left atrium  Value        Reference  LA ID, A-P, ES                              41    mm     ---------  LA ID/bsa, A-P                              1.91  cm/m^2 <=2.2    Mitral valve                                Value        Reference  Mitral mean velocity, D                     69.3  cm/s   ---------  Mitral mean gradient, D                     3     mm Hg  ---------  Mitral annulus VTI, D                       48.5  cm     ---------    Pulmonary arteries                          Value        Reference  PA pressure, S, DP                  (H)     41    mm Hg  <=30    Tricuspid valve                             Value        Reference  Tricuspid regurg peak velocity              288   cm/s   ---------  Tricuspid peak RV-RA gradient               33    mm Hg  ---------    Systemic veins                               Value        Reference  Estimated CVP                               3     mm Hg  ---------    Right ventricle                             Value        Reference  RV pressure, S, DP                  (H)     36    mm Hg  <=30  Legend: (L)  and  (H)  mark values outside specified reference range.  ------------------------------------------------------------------- Prepared and Electronically Authenticated by  Zoila Shutter MD 2017-10-20T12:58:57    CHEST  2 VIEW  COMPARISON:  03/28/2016 and 03/25/2016 CXR  FINDINGS: Heart is top-normal in size. There has been clearing of the small bilateral pleural effusions. Mitral valvular replacement is noted with atrial appendage clip, right atrial pacer and right ventricular ICD leads noted. AICD device projects over the left upper hemi thorax. The lungs are slightly hyperinflated without pneumonic consolidation. There is minimal bibasilar atelectasis. Nodular densities in each lung base consistent bilateral nipple shadows. The aorta is slightly uncoiled in appearance. No acute suspicious osseous abnormalities.  IMPRESSION: Status post mitral valvular replacement. Left AICD device with stable lead position. Left atrial appendage occlusive device is stable position. Clearing of bilateral small pleural effusions. No acute pulmonary disease.   Electronically Signed   By: Tollie Eth M.D.   On: 04/15/2016 14:59   Impression:  Patient is doing very well less than 1 month status post minimally invasive mitral valve repair.   Plan:  I have instructed the patient that he probably should not be taking potassium on a regular basis if he is not taking Lasix. I recommended that he take the 2 medications together only as needed for fluid overload.  We have otherwise not recommended any changes to the patient's current medications. I've encouraged the patient to enroll and participate in outpatient cardiac rehabilitation program. I have  also encouraged him to gradually increase his physical activity as tolerated with his primary limitation remaining that he refrain from heavy lifting or strenuous use of his arms or shoulders. All of his questions have been addressed. The patient will keep his follow-up appointment previously scheduled with Dr. Okey Dupre and return to our office for routine follow-up and approximately 2 months.    Salvatore Decent. Cornelius Moras, MD 04/15/2016 3:06 PM

## 2016-04-15 NOTE — Patient Instructions (Signed)
Only take potassium if you take lasix for fluid  Continue all other previous medications without any changes at this time  You may continue to gradually increase your physical activity as tolerated.  Refrain from any heavy lifting or strenuous use of your arms and shoulders until at least 8 weeks from the time of your surgery, and avoid activities that cause increased pain in your chest on the side of your surgical incision.  Otherwise you may continue to increase activities without any particular limitations.  Increase the intensity and duration of physical activity gradually.  You are encouraged to enroll and participate in the outpatient cardiac rehab program beginning as soon as practical.

## 2016-04-16 ENCOUNTER — Telehealth: Payer: Self-pay | Admitting: Internal Medicine

## 2016-04-16 NOTE — Telephone Encounter (Signed)
Returned call to patient. He said every time he takes his Mexitil, about 30 minutes later he begins to shake all over his body for about 3 hours then it stops and he is normal. He says he shakes so much he would be unable to hold a pencil and write. After that 3 hour period, he stops and continues as normal. This began about 1 week ago. He started the medication while in the hospital in October. He takes his BP/HR but does not write them down but states they are always good. Advised patient I will route to Dr End for advice.  After getting off the phone with patient, Dr End present.  Information presented to Dr End who advised to send to patient's EP doctor, Dr Ladona Ridgel.  Message routed to Dr Ladona Ridgel for advice.

## 2016-04-16 NOTE — Telephone Encounter (Signed)
Pt states Mexitil is giving him the shakes. States he cannot continue to take this. He states this was given to him while he was in the hospital.  Please call to discuss.

## 2016-04-17 ENCOUNTER — Ambulatory Visit (INDEPENDENT_AMBULATORY_CARE_PROVIDER_SITE_OTHER): Payer: Medicare Other

## 2016-04-17 DIAGNOSIS — I340 Nonrheumatic mitral (valve) insufficiency: Secondary | ICD-10-CM

## 2016-04-17 DIAGNOSIS — I714 Abdominal aortic aneurysm, without rupture, unspecified: Secondary | ICD-10-CM

## 2016-04-17 DIAGNOSIS — Z5181 Encounter for therapeutic drug level monitoring: Secondary | ICD-10-CM | POA: Diagnosis not present

## 2016-04-17 DIAGNOSIS — Z9889 Other specified postprocedural states: Secondary | ICD-10-CM

## 2016-04-17 LAB — POCT INR: INR: 2.3

## 2016-04-17 MED ORDER — MEXILETINE HCL 150 MG PO CAPS: 150.0000 mg | ORAL_CAPSULE | Freq: Two times a day (BID) | ORAL | 3 refills | Status: DC

## 2016-04-17 NOTE — Telephone Encounter (Signed)
Called, spoke with pt. Informed Dr. Ladona Ridgel recommended pt to decrease Mexitil to 1 cap (150 mg) every 12 hours. If symptoms do not improve, call our office. Pt verbalized understanding and agreed with plan.

## 2016-04-19 ENCOUNTER — Ambulatory Visit: Payer: Self-pay | Admitting: Unknown Physician Specialty

## 2016-04-30 ENCOUNTER — Telehealth: Payer: Self-pay | Admitting: Internal Medicine

## 2016-04-30 NOTE — Telephone Encounter (Signed)
Called, spoke with pt. Pt reported numbness in his fingertips, which started yesterday. Pt denies CP or SOB. Informed Pt Dr. Ladona Ridgel recommended pt to HOLD Mexitil for 1 week. Pt will call our office in 1 week to let us know how he is doing. If symptoms are better, stop Mexitil. Pt will need a replacement medication. If symptoms are not better, continue taking Mexitil. Pt may need to be seen by PCP. Informed to call our office if symptoms worsen.  Pt verbalized understanding and agreed with plan.

## 2016-04-30 NOTE — Telephone Encounter (Signed)
New Message:     Pt says his fingers started getting numb yesterday,they feel real funny on the ends. He really thinks it is coming from the Mexitil.

## 2016-05-01 ENCOUNTER — Ambulatory Visit (INDEPENDENT_AMBULATORY_CARE_PROVIDER_SITE_OTHER): Payer: Medicare Other

## 2016-05-01 DIAGNOSIS — I714 Abdominal aortic aneurysm, without rupture, unspecified: Secondary | ICD-10-CM

## 2016-05-01 DIAGNOSIS — I340 Nonrheumatic mitral (valve) insufficiency: Secondary | ICD-10-CM | POA: Diagnosis not present

## 2016-05-01 DIAGNOSIS — Z9889 Other specified postprocedural states: Secondary | ICD-10-CM

## 2016-05-01 DIAGNOSIS — Z5181 Encounter for therapeutic drug level monitoring: Secondary | ICD-10-CM

## 2016-05-01 LAB — POCT INR: INR: 4.4

## 2016-05-07 ENCOUNTER — Encounter: Payer: Self-pay | Admitting: Internal Medicine

## 2016-05-07 ENCOUNTER — Ambulatory Visit (INDEPENDENT_AMBULATORY_CARE_PROVIDER_SITE_OTHER): Payer: Medicare Other | Admitting: Internal Medicine

## 2016-05-07 VITALS — BP 120/70 | HR 90 | Ht 71.0 in | Wt 187.5 lb

## 2016-05-07 DIAGNOSIS — I340 Nonrheumatic mitral (valve) insufficiency: Secondary | ICD-10-CM

## 2016-05-07 DIAGNOSIS — I472 Ventricular tachycardia, unspecified: Secondary | ICD-10-CM

## 2016-05-07 DIAGNOSIS — Z23 Encounter for immunization: Secondary | ICD-10-CM | POA: Diagnosis not present

## 2016-05-07 DIAGNOSIS — I10 Essential (primary) hypertension: Secondary | ICD-10-CM

## 2016-05-07 DIAGNOSIS — I428 Other cardiomyopathies: Secondary | ICD-10-CM

## 2016-05-07 NOTE — Patient Instructions (Signed)
Medication Instructions:  Continue current medications.   Follow-Up: Your physician recommends that you schedule a follow-up appointment in: 3 MONTHS WITH DR END.  You received your FLU SHOT today.  If you need a refill on your cardiac medications before your next appointment, please call your pharmacy.

## 2016-05-07 NOTE — Progress Notes (Signed)
Follow-up Outpatient Visit Date: 05/07/2016  Chief Complaint: Follow-up mitral regurgitation with moderately reduced LVEF and recurrent ventricular tachycardia  HPI:  Roy Palmer is a 82 y.o. year-old male with history of severe mitral regurgitation status post mitral valve repair (03/2016), ventricular tachycardia status post ICD (03/2016), hypertension, hyperlipidemia, and chronic kidney disease, who presents for follow-up after lengthy hospitalization for mitral valve repair and management of postoperative VT. I first met the patient on 02/09/16 for evaluation of dizziness. At the time, a significant systolic murmur was noted as well as frequent PVCs on EKG. Subsequent evaluation, including transthoracic and transesophageal echocardiograms, revealed severe mitral regurgitation with flail posterior leaflet. Left and right heart catheterization showed mild, nonobstructive CAD and relatively normal left and right heart filling pressures with decreased cardiac index. Holter monitor was notable for nonsustained ventricular tachycardia and frequent PVCs. The patient was referred for outpatient cardiac surgery evaluation with Dr. Roxy Manns but subsequently had worsening dizziness and near syncope prompting ED visit and hospitalization. His mitral valve repair was expedited, which the patient tolerated well. Postoperatively, however, he experienced multiple episodes of sustained ventricular tachycardia requiring defibrillation and multiple antiarrhythmic agents. After consultation with electrophysiology, the patient was ultimately placed on a regimen of mexiletine and metoprolol with adequate suppression of his VT. He also went dual chamber ICD placement by Dr. Lovena Le. At our last visit on 04/03/16, the patient appeared intravascular volume depleted in the setting of lightheadedness and borderline hypotension. We agreed decrease furosemide to 40 mg daily prn swelling, as well as discontinue lisinopril.  Today, Roy Palmer reports that he feels quite well with the exception of shakiness and numbness involving both hands. He attributes this to mexiletine. The symptoms typically start about 30 minutes after taking the medication and resolve by the midafternoon. After consulting with Dr. Lovena Le last month, Roy Palmer held mexiletine for 4 days and experienced resolution of the symptoms. However, given concern over recurrent VT, he has restarted mexiletine 150 mg twice a day. He remains on metoprolol, aspirin, and warfarin. He has not had any significant bleeding. He denies shortness of breath, chest pain, palpitations, lightheadedness, and leg swelling. He has even been back hunting, which he was able to do without any limitations.  --------------------------------------------------------------------------------------------------  Cardiovascular History & Procedures: Cardiovascular Problems:  Severe mitral regurgitation status post repair (03/2016)  Frequent ventricular ectopy with sustained ventricular tachycardia status post ICD  Nonobstructive coronary artery disease  Risk Factors:  Age, male gender, hypertension, and hyperlipidemia  Cath/PCI:  LHC/RHC (02/23/16): Mild, nonobstructive CAD (30% ostial LMCA, 30% ostial LAD, 30% ostial ramus, and minimal luminal irregularities of dominant LCx. Normal right heart filling pressures. Decreased Fick cardiac output (CO 3.6 L/m, CI 1.8 L/m/m) in setting of frequent PVCs and severe MR.  CV Surgery:  Minimally invasive mitral valve repair (03/19/16, Dr. Roxy Manns)  EP Procedures and Devices:  24-hour Holter monitor (02/27/16): Predominantly sinus rhythm with average heart rate is 63 bpm (range 49-91 bpm). Longest RR interval 1.6 seconds. Frequent PVCs noted (11% burden) including frequent couplets and bigeminal cycles. 90 runs of NSVT were noted lasting up to 9 beats the maximum rate of 134 bpm. Rare SVT and supraventricular ectopy noted.  Dual chamber ICD  (03/27/16, Medtronic)  Non-Invasive Evaluation(s):  TTE (02/14/16): Normal LV size with moderate LVH. EF mildly reduced at 50% with possible inferolateral hypokinesis. Moderate to severe MR noted with prolapse/flail of the posterior leaflet. Severe left atrial enlargement. Moderate TR.  TEE (02/23/16): Normal LV size and wall  thickness with lower normal LVEF. Trivial AI. Severe, eccentric MR with reversal of pulmonary vein flow and flail posterior leaflet. No left atrial thrombus. Lipomatous hypertrophy of the septum. Mild to moderate TR.  Limited TTE (03/22/16): Normal LV size with moderate LVH. LVEF 40-45% with possible inferior hypokinesis. Trivial MR with mild left atrial enlargement. Normal RV size with moderately reduced systolic function. Mild TR. No pericardial effusion.  Recent CV Pertinent Labs: Lab Results  Component Value Date   CHOL 182 03/13/2016   CHOL 188 02/14/2016   HDL 33 (L) 03/13/2016   HDL 36 (L) 02/14/2016   LDLCALC 116 (H) 03/13/2016   LDLCALC 127 (H) 02/14/2016   TRIG 163 (H) 03/13/2016   CHOLHDL 5.5 03/13/2016   INR 4.4 05/01/2016   INR 2.45 03/29/2016   BNP 110.8 (H) 03/12/2016   K 4.5 03/29/2016   MG 2.2 03/29/2016   BUN 23 (H) 03/29/2016   BUN 19 02/27/2016   CREATININE 1.39 (H) 03/29/2016   Past medical and surgical history were reviewed and updated in EPIC.  Outpatient Encounter Prescriptions as of 05/07/2016  Medication Sig  . aspirin EC 81 MG tablet Take 1 tablet (81 mg total) by mouth daily.  . metoprolol tartrate (LOPRESSOR) 25 MG tablet Take 1 tablet (25 mg total) by mouth 2 (two) times daily.  Marland Kitchen mexiletine (MEXITIL) 150 MG capsule Take 1 capsule (150 mg total) by mouth 2 (two) times daily.  Marland Kitchen omeprazole (PRILOSEC) 20 MG capsule Take 20 mg by mouth daily.  Marland Kitchen warfarin (COUMADIN) 5 MG tablet Take 1 tablet (5 mg total) by mouth daily at 6 PM.  . [DISCONTINUED] furosemide (LASIX) 40 MG tablet Take 1 tablet (40 mg total) by mouth daily as needed for  edema. (Patient not taking: Reported on 05/07/2016)  . [DISCONTINUED] potassium chloride SA (K-DUR,KLOR-CON) 20 MEQ tablet Take 1 tablet (20 mEq total) by mouth daily as needed (take if you take lasix (furosemide)). (Patient not taking: Reported on 05/07/2016)   No facility-administered encounter medications on file as of 05/07/2016.     Allergies: Amiodarone and Lyrica [pregabalin]  Social History   Social History  . Marital status: Married    Spouse name: N/A  . Number of children: N/A  . Years of education: N/A   Occupational History  . Not on file.   Social History Main Topics  . Smoking status: Never Smoker  . Smokeless tobacco: Never Used  . Alcohol use No  . Drug use: No  . Sexual activity: Not on file   Other Topics Concern  . Not on file   Social History Narrative  . No narrative on file    Family History  Problem Relation Age of Onset  . Hypertension Mother   . Stroke Mother   . Heart disease Father   . Heart attack Father   . COPD Neg Hx   . Cancer Neg Hx   . Diabetes Neg Hx     Review of Systems: A 12-system review of systems was performed and was negative except as noted in the HPI.  --------------------------------------------------------------------------------------------------  Physical Exam: BP 120/70 (BP Location: Left Arm, Patient Position: Sitting, Cuff Size: Normal)   Pulse 90   Ht '5\' 11"'$  (1.803 m)   Wt 187 lb 8 oz (85 kg)   BMI 26.15 kg/m   General:  Well-developed, well-nourished elderly man seated comfortably in the exam room. He is accompanied by his daughter. HEENT: No conjunctival pallor or scleral icterus.  Moist mucous membranes.  OP clear. Neck: Supple without lymphadenopathy, thyromegaly, JVD, or HJR. Lungs: Normal work of breathing.  Mildly diminished breath sounds at the bases. Otherwise, normal air movement without wheezes or crackles. Heart: Regular rate and rhythm without murmurs, rubs, or gallops.  Non-displaced  PMI. Abd: Bowel sounds present.  Soft, NT/ND without hepatosplenomegaly Ext: No lower extremity edema.  Radial, PT, and DP pulses are 2+ bilaterally. Skin: warm and dry without rash. Right thoracotomy scar is well-healed.  Neuro: Minimal tremor noted in both hands.  EKG:  Normal sinus rhythm with first-degree AV block (PR interval 204 ms), right bundle branch block, and left anterior fascicular block (trifascicular block).  Lab Results  Component Value Date   WBC 9.3 03/27/2016   HGB 12.1 (L) 03/27/2016   HCT 36.3 (L) 03/27/2016   MCV 94.3 03/27/2016   PLT 158 03/27/2016    Lab Results  Component Value Date   NA 136 03/29/2016   K 4.5 03/29/2016   CL 104 03/29/2016   CO2 26 03/29/2016   BUN 23 (H) 03/29/2016   CREATININE 1.39 (H) 03/29/2016   GLUCOSE 109 (H) 03/29/2016   ALT 23 03/15/2016    Lab Results  Component Value Date   CHOL 182 03/13/2016   HDL 33 (L) 03/13/2016   LDLCALC 116 (H) 03/13/2016   TRIG 163 (H) 03/13/2016   CHOLHDL 5.5 03/13/2016    --------------------------------------------------------------------------------------------------  ASSESSMENT AND PLAN: Severe mitral regurgitation The patient is status post mitral valve repair and is doing well without symptoms of heart failure. He remains on warfarin following surgery. I will defer his continuation of this medication to Dr. Roxy Manns, with whom the patient is due to follow-up on 06/10/16.  Ventricular tachycardia No symptoms to suggest recurrent VT. The patient denies ICD shocks. He remains on metoprolol and mexiletine. Unfortunately, he continues to have paresthesias in tremor involving both hands that he attributes to mexiletine. The symptoms resolved with a brief mexiletine holiday. I will touch base with Dr. Lovena Le to see if he has further recommendations regarding antiarrhythmic therapy.  Hypertension Blood pressure is normal today. Hypotension resolved after his last visit with discontinuation of  lisinopril and furosemide.  Nonischemic cardiomyopathy The patient appears euvolemic and well compensated with NYHA class I-II symptoms. We will not make any medication changes today. We could consider up titration of his metoprolol based on Dr. Tanna Furry recommendations. I am hesitant to add back lisinopril for the time being, given his symptomatic hypotension at our last visit that led to discontinuation of this medication.  Influenza vaccination Flu vaccine administered today at the patient's request.  Follow-up: Return to clinic in 3 months.  Nelva Bush, MD 05/07/2016 4:18 PM

## 2016-05-08 NOTE — Telephone Encounter (Signed)
Follow Up:   Please call,concerning his medicine.

## 2016-05-09 NOTE — Telephone Encounter (Signed)
Call, left voice message to call back with concerns.

## 2016-05-10 NOTE — Progress Notes (Signed)
Please let Mr. Britten know that I have touched base with Dr. Ladona Ridgel.  He would like for Roy Palmer to continue his current dose of mexiletine and will see him back in clinic to discuss other medication options.  Thanks.

## 2016-05-10 NOTE — Telephone Encounter (Signed)
Called, spoke with pt. Pt stated he stopped Mexitil for 4 days and symptoms resolved. Pt started back on medication and symptoms came back (tingling/numbness fingers and hard to move his legs). Informed pt to stop medication. Pt stated symptoms not that severe. He requested to continue medication until I speak with him on Tuesday.  Pt described his symptoms a low/moderate. Informed I would forward to Dr. Ladona Ridgel to advise. Informed I will call pt back on Tuesday, 12/12. Pt verbalized understanding and thanked me for calling.

## 2016-05-10 NOTE — Telephone Encounter (Signed)
Follow Up: ° ° ° °Returning your call from yesterday. °

## 2016-05-15 ENCOUNTER — Telehealth: Payer: Self-pay | Admitting: *Deleted

## 2016-05-15 ENCOUNTER — Ambulatory Visit (INDEPENDENT_AMBULATORY_CARE_PROVIDER_SITE_OTHER): Payer: Medicare Other | Admitting: *Deleted

## 2016-05-15 DIAGNOSIS — Z9889 Other specified postprocedural states: Secondary | ICD-10-CM | POA: Diagnosis not present

## 2016-05-15 DIAGNOSIS — I714 Abdominal aortic aneurysm, without rupture, unspecified: Secondary | ICD-10-CM

## 2016-05-15 DIAGNOSIS — Z5181 Encounter for therapeutic drug level monitoring: Secondary | ICD-10-CM | POA: Diagnosis not present

## 2016-05-15 DIAGNOSIS — I340 Nonrheumatic mitral (valve) insufficiency: Secondary | ICD-10-CM | POA: Diagnosis not present

## 2016-05-15 LAB — POCT INR: INR: 2.9

## 2016-05-15 NOTE — Telephone Encounter (Signed)
Called, spoke with pt. Dr. Lubertha Basque recommendation is for pt to stop Mexitil and see him for a f/u office visit to discuss replacement. Informed pt f/u appt scheduled for 05/30/16 arriving at 10:15 AM. Informed pt to call if any symtoms prior to appt. Pt verbalized understanding and agreed with plan.

## 2016-05-15 NOTE — Telephone Encounter (Signed)
Roy Kendall, MD at 05/07/2016 2:30 PM   Status: Signed    Please let Roy Palmer know that I have touched base with Dr. Ladona Ridgel.  He would like for Roy Palmer to continue his current dose of mexiletine and will see him back in clinic to discuss other medication options.  Thanks.     S/w patient. Let him know what Dr End had advise per Dr Ladona Ridgel. Patient stated he was expecting a call from nurse Roy Palmer. He said he has still been taking the Mexitil. He still has about a 3 hour period after taking the medication where his hands feel numb and shaky and he feels like he looses control of his legs. Then he goes back to feeling more normal. Patient only has 3 pills left and needs to go to the drug store. He wants to make sure he should refill medication or if they were going to start something else. Advised to continue medication until otherwise as advised.  Will route to Dr Ladona Ridgel and Shirlean Schlein for further advice.

## 2016-05-30 ENCOUNTER — Ambulatory Visit (INDEPENDENT_AMBULATORY_CARE_PROVIDER_SITE_OTHER): Payer: Medicare Other | Admitting: Internal Medicine

## 2016-05-30 ENCOUNTER — Encounter: Payer: Self-pay | Admitting: Internal Medicine

## 2016-05-30 VITALS — BP 118/80 | HR 76 | Ht 71.0 in | Wt 189.4 lb

## 2016-05-30 DIAGNOSIS — I493 Ventricular premature depolarization: Secondary | ICD-10-CM

## 2016-05-30 DIAGNOSIS — I472 Ventricular tachycardia: Secondary | ICD-10-CM

## 2016-05-30 DIAGNOSIS — I428 Other cardiomyopathies: Secondary | ICD-10-CM

## 2016-05-30 DIAGNOSIS — Z95810 Presence of automatic (implantable) cardiac defibrillator: Secondary | ICD-10-CM

## 2016-05-30 MED ORDER — CARVEDILOL 3.125 MG PO TABS: 3.1250 mg | ORAL_TABLET | Freq: Two times a day (BID) | ORAL | 9 refills | Status: DC

## 2016-05-30 NOTE — Patient Instructions (Signed)
Medication Instructions: Your physician has recommended you make the following change in your medication:  --1. STOP Metoprolol --2. START Carvedilol - Take 1 tablet by mouth twice daily   Labwork: None Ordered  Procedures/Testing: None Ordered  Follow-Up: Remote monitoring is used to monitor your ICD from home. This monitoring reduces the number of office visits required to check your device to one time per year. It allows [place] to keep an eye on the functioning of your device to ensure it is working properly. You are scheduled for a device check from home on 08/29/16.. You may send your transmission at any time that day. If you have a wireless device, the transmission will be sent automatically. After your physician reviews your transmission, you will receive a postcard with your next transmission date.   Your physician wants you to follow-up in 9 MONTHS with Dr. Ladona Ridgel.  You will receive a reminder letter in the mail two months in advance. If you don't receive a letter, please call our office to schedule the follow-up appointment.     Any Additional Special Instructions Will Be Listed Below (If Applicable).     If you need a refill on your cardiac medications before your next appointment, please call your pharmacy.

## 2016-05-30 NOTE — Progress Notes (Signed)
HPI Roy Palmer returns today for followup. He is a pleasant 82 yo man with mitral valve surgery, VT, and sinus bradycardia who underwent ICD implant after his mitral valve surgery secondary to recurrent VT. He has done well in the interim. He denies chest pain or sob. In VT he felt weak but never had syncope. He has done very well on metoprolol. No chest pain or sob. He has recovered from his valve surgery very nicely. His only complaint today is weakness with his metoprolol.  Allergies  Allergen Reactions  . Amiodarone Other (See Comments)    Terrible dreams/nightmares  . Lyrica [Pregabalin] Nausea And Vomiting     Current Outpatient Prescriptions  Medication Sig Dispense Refill  . aspirin EC 81 MG tablet Take 1 tablet (81 mg total) by mouth daily.    Marland Kitchen omeprazole (PRILOSEC) 20 MG capsule Take 20 mg by mouth daily.    Marland Kitchen warfarin (COUMADIN) 5 MG tablet Take 1 tablet (5 mg total) by mouth daily at 6 PM. 30 tablet 3  . carvedilol (COREG) 3.125 MG tablet Take 1 tablet (3.125 mg total) by mouth 2 (two) times daily. 60 tablet 9   No current facility-administered medications for this visit.      Past Medical History:  Diagnosis Date  . AAA (abdominal aortic aneurysm) without rupture (HCC) 03/12/2016   Small - measured 3.3 x 3.5 cm by CTA  . CKD (chronic kidney disease) stage 3, GFR 30-59 ml/min   . Essential hypertension   . Hyperlipidemia   . Hypertensive kidney disease   . Incidental pulmonary nodule 03/12/2016   Vague opacity right lung base requires radiographic follow up - index of suspicion is LOW  . Near syncope    Cardiogenic, related to an SVT and severe MR  . Non-sustained ventricular tachycardia (HCC)   . S/P minimally invasive mitral valve repair 03/19/2016   Complex valvuloplasty including triangular resection of flail segment of posterior leaflet, chordal transposition x1, artificial Gore-tex neochord placement x4 and 34 mm Sorin Memo 3D ring annuloplasty via  right mini thoracotomy approach with clipping of LA appendage  . Severe mitral regurgitation by prior echocardiogram 02/23/2016   Confirmed by TEE  . Syncope 03/15/2016  . Thoracic aortic aneurysm (HCC) 03/12/2016   Very very mild fusiform enlargement of distal transverse and proximal descending thoracic aorta noted on CTA  . Trigeminal neuralgia     ROS:   All systems reviewed and negative except as noted in the HPI.   Past Surgical History:  Procedure Laterality Date  . CARDIAC CATHETERIZATION N/A 02/23/2016   Procedure: Right/Left Heart Cath and Coronary Angiography;  Surgeon: [name], MD;  Location: [facility] INVASIVE CV LAB;  Service: Cardiovascular: Angiographic minimal coronary disease. Normal left and right heart Pressures. Decreased CO/CI in settng of frequent PVCs & Severe MR.  Marland Kitchen CATARACT EXTRACTION, BILATERAL    . COLONOSCOPY  08/2005  . CYST EXCISION  10/2013   from neck  . EP IMPLANTABLE DEVICE N/A 03/27/2016   Procedure: ICD Implant Dual Chamber;  Surgeon: Marinus Maw, MD;  Location: [facility] INVASIVE CV LAB;  Service: Cardiovascular;  Laterality: N/A;  . ESOPHAGOGASTRODUODENOSCOPY  02/2010  . MITRAL VALVE REPAIR Right 03/19/2016   Procedure: MINIMALLY INVASIVE MITRAL VALVE REPAIR (MVR);  Surgeon: [name], MD;  Location: [facility] OR;  Service: Open Heart Surgery;  Laterality: Right;  . TEE WITHOUT CARDIOVERSION N/A 02/23/2016   Procedure: TRANSESOPHAGEAL ECHOCARDIOGRAM (TEE);  Surgeon: Quintella Reichert, MD;  Location: [facility] ENDOSCOPY;  Service: Cardiovascular: Normal LV size and function.  Degenerative mitral valve disease with failed posterior leaflet (P2 segment), Severe MR with pulmonary vein systolic flow reversal. Moderate-TR.    . TEE WITHOUT CARDIOVERSION N/A 03/19/2016   Procedure: TRANSESOPHAGEAL ECHOCARDIOGRAM (TEE);  Surgeon: Purcell Nailslarence H Owen, MD;  [facility];  Service: Open Heart Surgery;  Laterality: N/A;  . TRANSTHORACIC ECHOCARDIOGRAM  02/14/2016   EF 50-55%  with moderate LVH. Possible inferolateral hypokinesis. Moderate-severe MR with posterior leaflet prolapse, cannot rule out flail. Severe LA dilation. Moderate TR.     Family History  Problem Relation Age of Onset  . Hypertension Mother   . Stroke Mother   . Heart disease Father   . Heart attack Father   . COPD Neg Hx   . Cancer Neg Hx   . Diabetes Neg Hx      Social History   Social History  . Marital status: Married    Spouse name: N/A  . Number of children: N/A  . Years of education: N/A   Occupational History  . Not on file.   Social History Main Topics  . Smoking status: Never Smoker  . Smokeless tobacco: Never Used  . Alcohol use No  . Drug use: No  . Sexual activity: Not on file   Other Topics Concern  . Not on file   Social History Narrative  . No narrative on file     BP 118/80   Pulse 76   Ht 5\' 11"  (1.803 m)   Wt 189 lb 6.4 oz (85.9 kg)   SpO2 96%   BMI 26.42 kg/m   Physical Exam:  Well appearing NAD HEENT: Unremarkable Neck:  No JVD, no thyromegally Lymphatics:  No adenopathy Back:  No CVA tenderness Lungs:  Clear HEART:  Regular rate rhythm, no murmurs, no rubs, no clicks Abd:  soft, positive bowel sounds, no organomegally, no rebound, no guarding Ext:  2 plus pulses, no edema, no cyanosis, no clubbing Skin:  No rashes no nodules Neuro:  CN II through XII intact, motor grossly intact  EKG - nsr  DEVICE  Normal device function.  See PaceArt for details.   Assess/Plan: 1. VT - he has had no recurrent symptoms. He will undergo watchful waiting.  2. Chronic systolic heart failure - he appears to be class 1. He will continue his current meds. 3. ICD - his medtronic device is working normally. Will follow.  Leonia ReevesGregg Taylor,M.D.

## 2016-05-31 LAB — CUP PACEART INCLINIC DEVICE CHECK
Battery Remaining Longevity: 132 mo
Battery Voltage: 3.14 V
Brady Statistic AP VP Percent: 0 %
Brady Statistic AP VS Percent: 0.02 %
Brady Statistic AS VP Percent: 0.06 %
Brady Statistic AS VS Percent: 99.91 %
Brady Statistic RA Percent Paced: 0.02 %
Brady Statistic RV Percent Paced: 0.07 %
Date Time Interrogation Session: 20171228165829
HighPow Impedance: 61 Ohm
Implantable Lead Implant Date: 20171025
Implantable Lead Implant Date: 20171025
Implantable Lead Location: 753859
Implantable Lead Location: 753860
Implantable Lead Model: 5076
Implantable Pulse Generator Implant Date: 20171025
Lead Channel Impedance Value: 418 Ohm
Lead Channel Impedance Value: 456 Ohm
Lead Channel Impedance Value: 475 Ohm
Lead Channel Pacing Threshold Amplitude: 0.5 V
Lead Channel Pacing Threshold Amplitude: 1 V
Lead Channel Pacing Threshold Pulse Width: 0.4 ms
Lead Channel Pacing Threshold Pulse Width: 0.4 ms
Lead Channel Sensing Intrinsic Amplitude: 2.875 mV
Lead Channel Sensing Intrinsic Amplitude: 3.75 mV
Lead Channel Sensing Intrinsic Amplitude: 4.375 mV
Lead Channel Sensing Intrinsic Amplitude: 4.625 mV
Lead Channel Setting Pacing Amplitude: 2 V
Lead Channel Setting Pacing Amplitude: 2.5 V
Lead Channel Setting Pacing Pulse Width: 0.4 ms
Lead Channel Setting Sensing Sensitivity: 0.3 mV

## 2016-06-05 ENCOUNTER — Ambulatory Visit (INDEPENDENT_AMBULATORY_CARE_PROVIDER_SITE_OTHER): Payer: Medicare Other

## 2016-06-05 ENCOUNTER — Telehealth: Payer: Self-pay | Admitting: Internal Medicine

## 2016-06-05 DIAGNOSIS — I714 Abdominal aortic aneurysm, without rupture, unspecified: Secondary | ICD-10-CM

## 2016-06-05 DIAGNOSIS — I340 Nonrheumatic mitral (valve) insufficiency: Secondary | ICD-10-CM

## 2016-06-05 DIAGNOSIS — Z5181 Encounter for therapeutic drug level monitoring: Secondary | ICD-10-CM | POA: Diagnosis not present

## 2016-06-05 DIAGNOSIS — Z9889 Other specified postprocedural states: Secondary | ICD-10-CM | POA: Diagnosis not present

## 2016-06-05 LAB — POCT INR: INR: 3.8

## 2016-06-05 NOTE — Telephone Encounter (Signed)
Called, unable to reach pt. Unable to leave message because mailbox is full.

## 2016-06-05 NOTE — Telephone Encounter (Signed)
°  New Prob   Pt states he was started on carvedilol (COREG) 3.125 MG tablet last week. Pt states he can not take this medication as it makes him fatigued. Requesting an alternative. Please call.

## 2016-06-07 NOTE — Telephone Encounter (Signed)
Called, spoke with Roy Palmer. Informed Roy Palmer Dr. Ladona Ridgel recommended Roy Palmer to stop Coreg. No replacement medication indicated. Roy Palmer stated he stopped taking the med 4-5 days ago and has been doing great. Informed Roy Palmer to call our office if questions or concerns. Roy Palmer verbalized understanding and thanked me for calling.

## 2016-06-10 ENCOUNTER — Ambulatory Visit (INDEPENDENT_AMBULATORY_CARE_PROVIDER_SITE_OTHER): Payer: Self-pay | Admitting: Thoracic Surgery (Cardiothoracic Vascular Surgery)

## 2016-06-10 ENCOUNTER — Encounter: Payer: Self-pay | Admitting: Thoracic Surgery (Cardiothoracic Vascular Surgery)

## 2016-06-10 VITALS — BP 150/89 | HR 98 | Resp 16 | Ht 71.0 in | Wt 193.3 lb

## 2016-06-10 DIAGNOSIS — Z9889 Other specified postprocedural states: Secondary | ICD-10-CM

## 2016-06-10 DIAGNOSIS — I340 Nonrheumatic mitral (valve) insufficiency: Secondary | ICD-10-CM

## 2016-06-10 NOTE — Progress Notes (Signed)
[address]       Roy Palmer [address]             [phone]     CARDIOTHORACIC SURGERY OFFICE NOTE  Referring Provider is Palmer, Roy Deer, MD PCP is Roy Cirri, NP   HPI:  Patient is an 83 year old gentleman who returns to the office today for routine follow-up status post minimally invasive mitral valve repair on 03/19/2016 for severe symptom attic primary mitral regurgitation. His preoperative recent patient was also notable for recurrent dizzy spells and syncopal events secondary to recurrent ventricular tachycardia. Early following mitral valve repair he had multiple recurrent episodes of sustained ventricular tachycardia requiring cardioversion. He ultimately underwent implantation of an ICD on 03/27/2016 by Dr. Ladona Palmer. He was eventually discharged home and last seen here in our office on 04/15/2016 at which time he was doing quite well. Since then he has been seen in follow-up post by Dr. Okey Palmer and by Dr. Ladona Palmer. Mexiletine was stopped. He returns to our office today for routine follow-up.  The patient reports that he is doing very well. His defibrillator has never fired. His exercise tolerance is quite good. He states that his breathing is better than it was prior to surgery. He has no significant residual pain in his chest. Overall he is quite pleased with his progress. He currently remains anticoagulated using warfarin.   Current Outpatient Prescriptions  Medication Sig Dispense Refill  . aspirin EC 81 MG tablet Take 1 tablet (81 mg total) by mouth daily.    . carvedilol (COREG) 3.125 MG tablet Take 3.125 mg by mouth 2 (two) times daily with a meal.    . omeprazole (PRILOSEC) 20 MG capsule Take 20 mg by mouth daily.    Marland Kitchen warfarin (COUMADIN) 5 MG tablet Take 1 tablet (5 mg total) by mouth daily at 6 PM. 30 tablet 3   No current facility-administered medications for this visit.       Physical Exam:   BP (!) 150/89 (BP Location: Right Arm, Patient  Position: Sitting, Cuff Size: Large)   Pulse 98   Resp 16   Ht 5\' 11"  (1.803 m)   Wt 193 lb 4.8 oz (87.7 kg)   SpO2 96% Comment: ON RA  BMI 26.96 kg/m   General:  Well-appearing  Chest:   Clear to auscultation  CV:   Regular rate and rhythm without murmur  Incisions:  Well-healed  Abdomen:  Soft nontender  Extremities:  Warm and well-perfused   Diagnostic Tests:  Transthoracic Echocardiography  Patient:    Roy Palmer MR #:       [id number] Study Date: 03/22/2016 Gender:     M Age:        35 Height:     180.3 cm Weight:     90.7 kg BSA:        2.15 m^2 Pt. Status: Room:       2S16C   ATTENDING    Roy Palmer, M.D.  REFERRING    Roy Palmer, M.D.  ORDERING     Palmer, Roy  REFERRING    Palmer, Roy Gather    Roy Lemma, MD  SONOGRAPHER  Roy Palmer  PERFORMING   [facility], Inpatient  cc:  ------------------------------------------------------------------- LV EF: 40% -   45%  ------------------------------------------------------------------- Indications:      Pericardial disease - tamponnade 423.0, suspected, pre-procedure.  ------------------------------------------------------------------- History:   PMH:  Cardiac arrest S/P mitral valve repair. CKD III. NSVT. Thrombocytopenia.  -------------------------------------------------------------------  Study Conclusions  - Left ventricle: The cavity size was normal. There was moderate   concentric hypertrophy. Systolic function was mildly to   moderately reduced. The estimated ejection fraction was in the   range of 40% to 45%. Possible inferior hypokinesis and   incoordinate septal motion. - Mitral valve: Annuloplasty ring in place. Trivial regurgitation   without significant stenosis. - Left atrium: The atrium was mildly dilated. - Right ventricle: Normal size with moderately reduced RV systolic   function. - Right atrium: The atrium was normal in size. - Pulmonary arteries: PA  peak pressure: 41 mm Hg (S). - Systemic veins: The IVC measures <2.1 cm but does not collapse   >50%, suggesting an elevated RA pressure of 8 mmHg. - Pericardium, extracardiac: There was no pericardial effusion.   Features were not consistent with tamponade physiology.  Impressions:  - Compared to a prior study in 02/2016, the LVEF is lower at 40-45%   with possible inferior wall hypokinesis and incoordinate septal   motion. There is no pericardial effusion or concern for   tamponade, however, there is likely moderate RV dysfunction and   mild pulmonary hypertension with an RVSP of 41 mmHg.  ------------------------------------------------------------------- Study data:  Comparison was made to the study of 02/23/2016.  Study status:  STAT.  Procedure:  The patient reported no pain pre or post test. Transthoracic echocardiography. Image quality was adequate.          Transthoracic echocardiography.  M-mode, limited 2D, limited spectral Doppler, and color Doppler.  Birthdate: Patient birthdate: 1929/11/15.  Age:  Patient is 83 yr old.  Sex: Gender: male.    BMI: 27.9 kg/m^2.  Blood pressure:     124/69 Patient status:  Inpatient.  Study date:  Study date: 03/22/2016. Study time: 12:01 PM.  Location:  ICU/CCU  -------------------------------------------------------------------  ------------------------------------------------------------------- Left ventricle:  The cavity size was normal. There was moderate concentric hypertrophy. Systolic function was mildly to moderately reduced. The estimated ejection fraction was in the range of 40% to 45%. Possible inferior hypokinesis and incoordinate septal motion.   ------------------------------------------------------------------- Aorta:  Aortic root: The aortic root was normal in size. Ascending aorta: The ascending aorta was normal in size.  ------------------------------------------------------------------- Mitral valve:   Annuloplasty ring in place. Trivial regurgitation without significant stenosis.  Doppler:     Mean gradient (D): 3 mm Hg.  ------------------------------------------------------------------- Left atrium:  The atrium was mildly dilated.  ------------------------------------------------------------------- Right ventricle:  Normal size with moderately reduced RV systolic function.  ------------------------------------------------------------------- Tricuspid valve:   Doppler:  There was mild regurgitation.  ------------------------------------------------------------------- Pulmonary artery:   The main pulmonary artery was normal-sized.  ------------------------------------------------------------------- Right atrium:  The atrium was normal in size.  ------------------------------------------------------------------- Pericardium:  There was no pericardial effusion.  Doppler: Features were not consistent with tamponade physiology.  ------------------------------------------------------------------- Systemic veins:  The IVC measures <2.1 cm but does not collapse >50%, suggesting an elevated RA pressure of 8 mmHg.  ------------------------------------------------------------------- Measurements   Left ventricle                              Value        Reference  LV ID, ED, PLAX chordal             (H)     56.4  mm     43 - 52  LV ID, ES, PLAX chordal             (  H)     45.6  mm     23 - 38  LV fx shortening, PLAX chordal      (L)     19    %      >=29  LV PW thickness, ED                         13.4  mm     ---------  IVS/LV PW ratio, ED                         1.03         <=1.3  LV Palmer-diastolic volume, 1-p A2C            130   ml     ---------  LV Palmer-systolic volume, 1-p A2C             67    ml     ---------  LV Palmer-diastolic volume, 1-p A4C            197   ml     ---------  LV Palmer-systolic volume, 1-p A4C             121   ml     ---------  LV ejection fraction,  1-p A4C               39    %      ---------  Stroke volume, 1-p A4C                      76    ml     ---------  LV Palmer-diastolic volume/bsa, 1-p            92    ml/m^2 ---------  A4C  LV Palmer-systolic volume/bsa, 1-p A4C         56    ml/m^2 ---------  Stroke volume/bsa, 1-p A4C                  35    ml/m^2 ---------  LV Palmer-diastolic volume, 2-p                177   ml     ---------  LV Palmer-systolic volume, 2-p                 100   ml     ---------  LV ejection fraction, 2-p                   43    %      ---------  Stroke volume, 2-p                          77    ml     ---------  LV Palmer-diastolic volume/bsa, 2-p            82    ml/m^2 ---------  LV Palmer-systolic volume/bsa, 2-p             47    ml/m^2 ---------  Stroke volume/bsa, 2-p                      35.7  ml/m^2 ---------    Ventricular septum  Value        Reference  IVS thickness, ED                           13.8  mm     ---------    LVOT                                        Value        Reference  LVOT ID, S                                  23    mm     ---------  LVOT area                                   4.15  cm^2   ---------    Aorta                                       Value        Reference  Aortic root ID, ED                          38    mm     ---------    Left atrium                                 Value        Reference  LA ID, A-P, ES                              41    mm     ---------  LA ID/bsa, A-P                              1.91  cm/m^2 <=2.2    Mitral valve                                Value        Reference  Mitral mean velocity, D                     69.3  cm/s   ---------  Mitral mean gradient, D                     3     mm Hg  ---------  Mitral annulus VTI, D                       48.5  cm     ---------    Pulmonary arteries                          Value        Reference  PA pressure, S, DP                  (  H)     41    mm Hg  <=30    Tricuspid valve                              Value        Reference  Tricuspid regurg peak velocity              288   cm/s   ---------  Tricuspid peak RV-RA gradient               33    mm Hg  ---------    Systemic veins                              Value        Reference  Estimated CVP                               3     mm Hg  ---------    Right ventricle                             Value        Reference  RV pressure, S, DP                  (H)     36    mm Hg  <=30  Legend: (L)  and  (H)  mark values outside specified reference range.  ------------------------------------------------------------------- Prepared and Electronically Authenticated by  Zoila Shutter MD 2017-10-20T12:58:57   Impression:  Patient is doing very well approximately 3 months following minimally invasive mitral valve repair.  Plan:  I have encouraged the patient to continue to increase his physical activity without any particular limitations at this time. I think she would benefit from enrolling in participating in the outpatient cardiac rehabilitation program. I have instructed the patient to stop taking Coumadin. He should remain on aspirin 81 mg daily indefinitely. We have otherwise not recommended any changes to his current medications.  The patient has been reminded regarding the importance of dental hygiene and the lifelong need for antibiotic prophylaxis for all dental cleanings and other related invasive procedures.  Patient will continue to follow-up intermittently with Dr. Okey Palmer and Dr. Ladona Palmer. He will return to our office for follow-up next October, approximately 1 year following his original surgery. He will call and return sooner should specific problems or questions arise.     Salvatore Decent. Cornelius Moras, MD 06/10/2016 3:28 PM

## 2016-06-10 NOTE — Patient Instructions (Signed)
You may resume unrestricted physical activity without any particular limitations at this time.  Stop taking Coumadin (wafarin) but continue taking aspirin daily  Continue all other previous medications without any changes at this time  You are encouraged to enroll and participate in the outpatient cardiac rehab program beginning as soon as practical.  Endocarditis is a potentially serious infection of heart valves or inside lining of the heart.  It occurs more commonly in patients with diseased heart valves (such as patient's with aortic or mitral valve disease) and in patients who have undergone heart valve repair or replacement.  Certain surgical and dental procedures may put you at risk, such as dental cleaning, other dental procedures, or any surgery involving the respiratory, urinary, gastrointestinal tract, gallbladder or prostate gland.   To minimize your chances for develooping endocarditis, maintain good oral health and seek prompt medical attention for any infections involving the mouth, teeth, gums, skin or urinary tract.    Always notify your doctor or dentist about your underlying heart valve condition before having any invasive procedures. You will need to take antibiotics before certain procedures, including all routine dental cleanings or other dental procedures.  Your cardiologist or dentist should prescribe these antibiotics for you to be taken ahead of time.

## 2016-06-24 ENCOUNTER — Encounter: Payer: Medicare Other | Admitting: Thoracic Surgery (Cardiothoracic Vascular Surgery)

## 2016-06-24 ENCOUNTER — Encounter: Payer: Medicare Other | Attending: Thoracic Surgery (Cardiothoracic Vascular Surgery) | Admitting: *Deleted

## 2016-06-24 VITALS — Ht 69.9 in | Wt 190.1 lb

## 2016-06-24 DIAGNOSIS — E785 Hyperlipidemia, unspecified: Secondary | ICD-10-CM | POA: Insufficient documentation

## 2016-06-24 DIAGNOSIS — I712 Thoracic aortic aneurysm, without rupture: Secondary | ICD-10-CM | POA: Diagnosis not present

## 2016-06-24 DIAGNOSIS — Z79899 Other long term (current) drug therapy: Secondary | ICD-10-CM | POA: Insufficient documentation

## 2016-06-24 DIAGNOSIS — Z48812 Encounter for surgical aftercare following surgery on the circulatory system: Secondary | ICD-10-CM | POA: Insufficient documentation

## 2016-06-24 DIAGNOSIS — I129 Hypertensive chronic kidney disease with stage 1 through stage 4 chronic kidney disease, or unspecified chronic kidney disease: Secondary | ICD-10-CM | POA: Diagnosis not present

## 2016-06-24 DIAGNOSIS — Z7982 Long term (current) use of aspirin: Secondary | ICD-10-CM | POA: Diagnosis not present

## 2016-06-24 DIAGNOSIS — N183 Chronic kidney disease, stage 3 (moderate): Secondary | ICD-10-CM | POA: Diagnosis not present

## 2016-06-24 DIAGNOSIS — Z9889 Other specified postprocedural states: Secondary | ICD-10-CM

## 2016-06-24 NOTE — Patient Instructions (Addendum)
Patient Instructions  Patient Details  Name: Roy Palmer MRN: [id number] Date of Birth: 1930/02/02 Referring Provider:  [name], MD  Below are the personal goals you chose as well as exercise and nutrition goals. Our goal is to help you keep on track towards obtaining and maintaining your goals. We will be discussing your progress on these goals with you throughout the program.  Initial Exercise Prescription:     Initial Exercise Prescription - 06/24/16 1300      Date of Initial Exercise RX and Referring Provider   Date 06/24/16   Referring Provider End, Cristal Deer MD     Treadmill   MPH 0.8   Grade 0   Minutes 15   METs 1.6     Bike   Level 0.3   Minutes 15   METs 2.2     Arm Ergometer   Level 2   Minutes 15   METs 2     Prescription Details   Frequency (times per week) 3   Duration Progress to 45 minutes of aerobic exercise without signs/symptoms of physical distress     Intensity   THRR 40-80% of Max Heartrate 113-128   Ratings of Perceived Exertion 11-13   Perceived Dyspnea 0-4     Progression   Progression Continue to progress workloads to maintain intensity without signs/symptoms of physical distress.     Resistance Training   Training Prescription Yes   Weight 3 lbs   Reps 10-15      Exercise Goals: Frequency: Be able to perform aerobic exercise three times per week working toward 3-5 days per week.  Intensity: Work with a perceived exertion of 11 (fairly light) - 15 (hard) as tolerated. Follow your new exercise prescription and watch for changes in prescription as you progress with the program. Changes will be reviewed with you when they are made.  Duration: You should be able to do 30 minutes of continuous aerobic exercise in addition to a 5 minute warm-up and a 5 minute cool-down routine.  Nutrition Goals: Your personal nutrition goals will be established when you do your nutrition analysis with the dietician.  The following are  nutrition guidelines to follow: Cholesterol < 200mg /day Sodium < 1500mg /day Fiber: Men over 50 yrs - 30 grams per day  Personal Goals:     Personal Goals and Risk Factors at Admission - 06/24/16 1229      Core Components/Risk Factors/Patient Goals on Admission    Weight Management Yes;Weight Loss   Intervention Weight Management: Develop a combined nutrition and exercise program designed to reach desired caloric intake, while maintaining appropriate intake of nutrient and fiber, sodium and fats, and appropriate energy expenditure required for the weight goal.;Weight Management: Provide education and appropriate resources to help participant work on and attain dietary goals.   Admit Weight 190 lb 1.6 oz (86.2 kg)   Goal Weight: Short Term 185 lb (83.9 kg)   Goal Weight: Long Term 180 lb (81.6 kg)   Expected Outcomes Short Term: Continue to assess and modify interventions until short term weight is achieved;Long Term: Adherence to nutrition and physical activity/exercise program aimed toward attainment of established weight goal;Weight Loss: Understanding of general recommendations for a balanced deficit meal plan, which promotes 1-2 lb weight loss per week and includes a negative energy balance of [phone] kcal/d;Understanding recommendations for meals to include 15-35% energy as protein, 25-35% energy from fat, 35-60% energy from carbohydrates, less than 200mg  of dietary cholesterol, 20-35 gm of total fiber daily;Understanding  of distribution of calorie intake throughout the day with the consumption of 4-5 meals/snacks   Sedentary Yes   Intervention Provide advice, education, support and counseling about physical activity/exercise needs.;Develop an individualized exercise prescription for aerobic and resistive training based on initial evaluation findings, risk stratification, comorbidities and participant's personal goals.   Expected Outcomes Achievement of increased cardiorespiratory fitness and  enhanced flexibility, muscular endurance and strength shown through measurements of functional capacity and personal statement of participant.   Increase Strength and Stamina Yes   Intervention Provide advice, education, support and counseling about physical activity/exercise needs.;Develop an individualized exercise prescription for aerobic and resistive training based on initial evaluation findings, risk stratification, comorbidities and participant's personal goals.   Expected Outcomes Achievement of increased cardiorespiratory fitness and enhanced flexibility, muscular endurance and strength shown through measurements of functional capacity and personal statement of participant.   Hypertension Yes   Intervention Provide education on lifestyle modifcations including regular physical activity/exercise, weight management, moderate sodium restriction and increased consumption of fresh fruit, vegetables, and low fat dairy, alcohol moderation, and smoking cessation.;Monitor prescription use compliance.   Expected Outcomes Short Term: Continued assessment and intervention until BP is < 140/21mm HG in hypertensive participants. < 130/32mm HG in hypertensive participants with diabetes, heart failure or chronic kidney disease.;Long Term: Maintenance of blood pressure at goal levels.   Lipids Yes   Intervention Provide education and support for participant on nutrition & aerobic/resistive exercise along with prescribed medications to achieve LDL 70mg , HDL >40mg .   Expected Outcomes Short Term: Participant states understanding of desired cholesterol values and is compliant with medications prescribed. Participant is following exercise prescription and nutrition guidelines.;Long Term: Cholesterol controlled with medications as prescribed, with individualized exercise RX and with personalized nutrition plan. Value goals: LDL < 70mg , HDL > 40 mg.      Tobacco Use Initial Evaluation: History  Smoking Status  .  Never Smoker  Smokeless Tobacco  . Never Used    Copy of goals given to participant.

## 2016-06-24 NOTE — Progress Notes (Signed)
Daily Session Note  Patient Details  Name: Roy Palmer MRN: [id number] Date of Birth: 04/25/1930 Referring Provider:  Dr. Roxy Manns  Encounter Date: 06/24/2016  Check In:     Session Check In - 06/24/16 1227      Check-In   Location [facility]   Staff Present Heath Lark, RN, BSN, CCRP;Jessica Hull, MA, ACSM RCEP, Exercise Physiologist;Amarri Michaelson, RN, BSN   Supervising physician immediately available to respond to emergencies See telemetry face sheet for immediately available ER MD   Medication changes reported     No   Fall or balance concerns reported    No   Warm-up and Cool-down Performed as group-led instruction   Resistance Training Performed Yes   VAD Patient? No     Pain Assessment   Currently in Pain? No/denies         Goals Met:  Personal goals reviewed  Goals Unmet:  Not Applicable  Comments: Roy Palmer will start Cardiac Rehab although he feels he is walking and doing ok.    Dr. Emily Filbert is Medical Director for [place] and [facility].

## 2016-06-24 NOTE — Progress Notes (Signed)
Cardiac Individual Treatment Plan  Patient Details  Name: Roy Palmer MRN: [id number] Date of Birth: 1929/11/02 Referring Provider:   Flowsheet Row Cardiac Rehab from 06/24/2016 in [facility] Cardiac and Pulmonary Rehab  Referring Provider  End, Cristal Deer MD      Initial Encounter Date:  Flowsheet Row Cardiac Rehab from 06/24/2016 in [facility] Cardiac and Pulmonary Rehab  Date  06/24/16  Referring Provider  End, Cristal Deer MD      Visit Diagnosis: S/P mitral valve repair  Patient's Home Medications on Admission:  Current Outpatient Prescriptions:  .  aspirin EC 81 MG tablet, Take 1 tablet (81 mg total) by mouth daily., Disp: , Rfl:  .  omeprazole (PRILOSEC) 20 MG capsule, Take 20 mg by mouth daily., Disp: , Rfl:  .  carvedilol (COREG) 3.125 MG tablet, Take 3.125 mg by mouth 2 (two) times daily with a meal., Disp: , Rfl:   Past Medical History: Past Medical History:  Diagnosis Date  . AAA (abdominal aortic aneurysm) without rupture (HCC) 03/12/2016   Small - measured 3.3 x 3.5 cm by CTA  . CKD (chronic kidney disease) stage 3, GFR 30-59 ml/min   . Essential hypertension   . Hyperlipidemia   . Hypertensive kidney disease   . Incidental pulmonary nodule 03/12/2016   Vague opacity right lung base requires radiographic follow up - index of suspicion is LOW  . Near syncope    Cardiogenic, related to an SVT and severe MR  . Non-sustained ventricular tachycardia (HCC)   . S/P minimally invasive mitral valve repair 03/19/2016   Complex valvuloplasty including triangular resection of flail segment of posterior leaflet, chordal transposition x1, artificial Gore-tex neochord placement x4 and 34 mm Sorin Memo 3D ring annuloplasty via right mini thoracotomy approach with clipping of LA appendage  . Severe mitral regurgitation by prior echocardiogram 02/23/2016   Confirmed by TEE  . Syncope 03/15/2016  . Thoracic aortic aneurysm (HCC) 03/12/2016   Very very mild fusiform enlargement of distal  transverse and proximal descending thoracic aorta noted on CTA  . Trigeminal neuralgia     Tobacco Use: History  Smoking Status  . Never Smoker  Smokeless Tobacco  . Never Used    Labs: Recent Review Flowsheet Data    Labs for ITP Cardiac and Pulmonary Rehab Latest Ref Rng & Units 03/19/2016 03/19/2016 03/19/2016 03/20/2016 03/22/2016   Cholestrol 0 - 200 mg/dL - - - - -   LDLCALC 0 - 99 mg/dL - - - - -   HDL >95 mg/dL - - - - -   Trlycerides <150 mg/dL - - - - -   Hemoglobin A1c 4.8 - 5.6 % - - - - -   PHART 7.350 - 7.450 7.353 - 7.416 - -   PCO2ART 32.0 - 48.0 mmHg 40.1 - 29.9(L) - -   HCO3 20.0 - 28.0 mmol/L 22.4 - 19.3(L) - -   TCO2 0 - 100 mmol/L 24 24 20 23 24    ACIDBASEDEF 0.0 - 2.0 mmol/L 3.0(H) - 4.0(H) - -   O2SAT % 96.0 - 98.0 - -       Exercise Target Goals: Date: 06/24/16  Exercise Program Goal: Individual exercise prescription set with THRR, safety & activity barriers. Participant demonstrates ability to understand and report RPE using BORG scale, to self-measure pulse accurately, and to acknowledge the importance of the exercise prescription.  Exercise Prescription Goal: Starting with aerobic activity 30 plus minutes a day, 3 days per week for initial exercise prescription. Provide home  exercise prescription and guidelines that participant acknowledges understanding prior to discharge.  Activity Barriers & Risk Stratification:     Activity Barriers & Cardiac Risk Stratification - 06/24/16 1226      Activity Barriers & Cardiac Risk Stratification   Activity Barriers None   Cardiac Risk Stratification High      6 Minute Walk:     6 Minute Walk    Row Name 06/24/16 1320         6 Minute Walk   Phase Initial     Distance 860 feet     Walk Time 6 minutes     # of Rest Breaks 0     MPH 1.63     METS 2.23     RPE 9     VO2 Peak 5.76     Symptoms No     Resting HR 98 bpm     Resting BP 140/60     Max Ex. HR 112 bpm     Max Ex. BP 146/64      2 Minute Post BP 134/66        Initial Exercise Prescription:     Initial Exercise Prescription - 06/24/16 1300      Date of Initial Exercise RX and Referring Provider   Date 06/24/16   Referring Provider End, Cristal Deer MD     Treadmill   MPH 0.8   Grade 0   Minutes 15   METs 1.6     Bike   Level 0.3   Minutes 15   METs 2.2     Arm Ergometer   Level 2   Minutes 15   METs 2     Prescription Details   Frequency (times per week) 3   Duration Progress to 45 minutes of aerobic exercise without signs/symptoms of physical distress     Intensity   THRR 40-80% of Max Heartrate 113-128   Ratings of Perceived Exertion 11-13   Perceived Dyspnea 0-4     Progression   Progression Continue to progress workloads to maintain intensity without signs/symptoms of physical distress.     Resistance Training   Training Prescription Yes   Weight 3 lbs   Reps 10-15      Perform Capillary Blood Glucose checks as needed.  Exercise Prescription Changes:      Exercise Prescription Changes    Row Name 06/24/16 1300             Exercise Review   Progression -  walk test results         Response to Exercise   Blood Pressure (Admit) 140/60       Blood Pressure (Exercise) 146/64       Blood Pressure (Exit) 134/66       Heart Rate (Admit) 98 bpm       Heart Rate (Exercise) 112 bpm       Heart Rate (Exit) 93 bpm       Oxygen Saturation (Exercise) 99 %       Rating of Perceived Exertion (Exercise) 9       Symptoms none          Exercise Comments:   Discharge Exercise Prescription (Final Exercise Prescription Changes):     Exercise Prescription Changes - 06/24/16 1300      Exercise Review   Progression --  walk test results     Response to Exercise   Blood Pressure (Admit) 140/60   Blood Pressure (Exercise) 146/64   Blood  Pressure (Exit) 134/66   Heart Rate (Admit) 98 bpm   Heart Rate (Exercise) 112 bpm   Heart Rate (Exit) 93 bpm   Oxygen Saturation  (Exercise) 99 %   Rating of Perceived Exertion (Exercise) 9   Symptoms none      Nutrition:  Target Goals: Understanding of nutrition guidelines, daily intake of sodium 1500mg , cholesterol 200mg , calories 30% from fat and 7% or less from saturated fats, daily to have 5 or more servings of fruits and vegetables.  Biometrics:     Pre Biometrics - 06/24/16 1331      Pre Biometrics   Height 5' 9.9" (1.775 m)   Weight 190 lb 1.6 oz (86.2 kg)   Waist Circumference 39 inches   Hip Circumference 42 inches   Waist to Hip Ratio 0.93 %   BMI (Calculated) 27.4   Single Leg Stand 2.65 seconds       Nutrition Therapy Plan and Nutrition Goals:     Nutrition Therapy & Goals - 06/24/16 1224      Nutrition Therapy   Diet --  Roy Palmer said he knows what to eat and declined an individual appt with the [facility] RE[facility]istered Dietician.    Drug/Food Interactions Coumadin/Vit K     Intervention Plan   Intervention Prescribe, educate and counsel regarding individualized specific dietary modifications aiming towards targeted core components such as weight, hypertension, lipid management, diabetes, heart failure and other comorbidities.   Expected Outcomes Short Term Goal: Understand basic principles of dietary content, such as calories, fat, sodium, cholesterol and nutrients.      Nutrition Discharge: Rate Your Plate Scores:     Nutrition Assessments - 06/24/16 1229      Rate Your Plate Scores   Pre Score --  Roy Palmer did not complete this form.      MEDFICTS Scores   Pre Score --  Roy Palmer did not complete this form.       Nutrition Goals Re-Evaluation:   Psychosocial: Target Goals: Acknowledge presence or absence of depression, maximize coping skills, provide positive support system. Participant is able to verbalize types and ability to use techniques and skills needed for reducing stress and depression.  Initial Review & Psychosocial Screening:     Initial Psych Review & Screening  - 06/24/16 1229      Family Dynamics   Good Support System? Yes     Barriers   Psychosocial barriers to participate in program There are no identifiable barriers or psychosocial needs.     Screening Interventions   Interventions Encouraged to exercise      Quality of Life Scores:     Quality of Life - 06/24/16 1230      Quality of Life Scores   Health/Function Pre --  Roy Palmer did not complete this form.       PHQ-9: Recent Review Flowsheet Data    Depression screen [facility] 2/9 06/24/2016   Decreased Interest 0   Down, Depressed, Hopeless 0   PHQ - 2 Score 0   Altered sleeping 0   Tired, decreased energy 0   Change in appetite 0   Feeling bad or failure about yourself  0   Trouble concentrating 0   Moving slowly or fidgety/restless 0   Suicidal thoughts 0   PHQ-9 Score 0      Psychosocial Evaluation and Intervention:   Psychosocial Re-Evaluation:   Vocational Rehabilitation: Provide vocational rehab assistance to qualifying candidates.   Vocational Rehab Evaluation & Intervention:  Vocational Rehab - 06/24/16 1227      Initial Vocational Rehab Evaluation & Intervention   Assessment shows need for Vocational Rehabilitation No      Education: Education Goals: Education classes will be provided on a weekly basis, covering required topics. Participant will state understanding/return demonstration of topics presented.  Learning Barriers/Preferences:     Learning Barriers/Preferences - 06/24/16 1226      Learning Barriers/Preferences   Learning Barriers None   Learning Preferences None      Education Topics: General Nutrition Guidelines/Fats and Fiber: -Group instruction provided by verbal, written material, models and posters to present the general guidelines for heart healthy nutrition. Gives an explanation and review of dietary fats and fiber.   Controlling Sodium/Reading Food Labels: -Group verbal and written material supporting the discussion  of sodium use in heart healthy nutrition. Review and explanation with models, verbal and written materials for utilization of the food label.   Exercise Physiology & Risk Factors: - Group verbal and written instruction with models to review the exercise physiology of the cardiovascular system and associated critical values. Details cardiovascular disease risk factors and the goals associated with each risk factor.   Aerobic Exercise & Resistance Training: - Gives group verbal and written discussion on the health impact of inactivity. On the components of aerobic and resistive training programs and the benefits of this training and how to safely progress through these programs.   Flexibility, Balance, General Exercise Guidelines: - Provides group verbal and written instruction on the benefits of flexibility and balance training programs. Provides general exercise guidelines with specific guidelines to those with heart or lung disease. Demonstration and skill practice provided.   Stress Management: - Provides group verbal and written instruction about the health risks of elevated stress, cause of high stress, and healthy ways to reduce stress.   Depression: - Provides group verbal and written instruction on the correlation between heart/lung disease and depressed mood, treatment options, and the stigmas associated with seeking treatment.   Anatomy & Physiology of the Heart: - Group verbal and written instruction and models provide basic cardiac anatomy and physiology, with the coronary electrical and arterial systems. Review of: AMI, Angina, Valve disease, Heart Failure, Cardiac Arrhythmia, Pacemakers, and the ICD.   Cardiac Procedures: - Group verbal and written instruction and models to describe the testing methods done to diagnose heart disease. Reviews the outcomes of the test results. Describes the treatment choices: Medical Management, Angioplasty, or Coronary Bypass  Surgery.   Cardiac Medications: - Group verbal and written instruction to review commonly prescribed medications for heart disease. Reviews the medication, class of the drug, and side effects. Includes the steps to properly store meds and maintain the prescription regimen.   Go Sex-Intimacy & Heart Disease, Get SMART - Goal Setting: - Group verbal and written instruction through game format to discuss heart disease and the return to sexual intimacy. Provides group verbal and written material to discuss and apply goal setting through the application of the S.M.A.R.T. Method.   Other Matters of the Heart: - Provides group verbal, written materials and models to describe Heart Failure, Angina, Valve Disease, and Diabetes in the realm of heart disease. Includes description of the disease process and treatment options available to the cardiac patient.   Exercise & Equipment Safety: - Individual verbal instruction and demonstration of equipment use and safety with use of the equipment. Flowsheet Row Cardiac Rehab from 06/24/2016 in [facility] Cardiac and Pulmonary Rehab  Date  06/24/16  Educator  Salena Saner EnterkinRN  Instruction Review Code  1- partially meets, needs review/practice      Infection Prevention: - Provides verbal and written material to individual with discussion of infection control including proper hand washing and proper equipment cleaning during exercise session. Flowsheet Row Cardiac Rehab from 06/24/2016 in [facility] Cardiac and Pulmonary Rehab  Date  06/24/16  Educator  C. EnterkinRN  Instruction Review Code  2- meets goals/outcomes      Falls Prevention: - Provides verbal and written material to individual with discussion of falls prevention and safety. Flowsheet Row Cardiac Rehab from 06/24/2016 in [facility] Cardiac and Pulmonary Rehab  Date  06/24/16  Educator  C. EnterkinRN  Instruction Review Code  2- meets goals/outcomes      Diabetes: - Individual verbal and written  instruction to review signs/symptoms of diabetes, desired ranges of glucose level fasting, after meals and with exercise. Advice that pre and post exercise glucose checks will be done for 3 sessions at entry of program.    Knowledge Questionnaire Score:     Knowledge Questionnaire Score - 06/24/16 1227      Knowledge Questionnaire Score   Pre Score --  Roy Palmer declined to do this questionnaire .      Core Components/Risk Factors/Patient Goals at Admission:     Personal Goals and Risk Factors at Admission - 06/24/16 1229      Core Components/Risk Factors/Patient Goals on Admission    Weight Management Yes;Weight Loss   Intervention Weight Management: Develop a combined nutrition and exercise program designed to reach desired caloric intake, while maintaining appropriate intake of nutrient and fiber, sodium and fats, and appropriate energy expenditure required for the weight goal.;Weight Management: Provide education and appropriate resources to help participant work on and attain dietary goals.   Admit Weight 190 lb 1.6 oz (86.2 kg)   Goal Weight: Short Term 185 lb (83.9 kg)   Goal Weight: Long Term 180 lb (81.6 kg)   Expected Outcomes Short Term: Continue to assess and modify interventions until short term weight is achieved;Long Term: Adherence to nutrition and physical activity/exercise program aimed toward attainment of established weight goal;Weight Loss: Understanding of general recommendations for a balanced deficit meal plan, which promotes 1-2 lb weight loss per week and includes a negative energy balance of [phone] kcal/d;Understanding recommendations for meals to include 15-35% energy as protein, 25-35% energy from fat, 35-60% energy from carbohydrates, less than 200mg  of dietary cholesterol, 20-35 gm of total fiber daily;Understanding of distribution of calorie intake throughout the day with the consumption of 4-5 meals/snacks   Sedentary Yes   Intervention Provide advice,  education, support and counseling about physical activity/exercise needs.;Develop an individualized exercise prescription for aerobic and resistive training based on initial evaluation findings, risk stratification, comorbidities and participant's personal goals.   Expected Outcomes Achievement of increased cardiorespiratory fitness and enhanced flexibility, muscular endurance and strength shown through measurements of functional capacity and personal statement of participant.   Increase Strength and Stamina Yes   Intervention Provide advice, education, support and counseling about physical activity/exercise needs.;Develop an individualized exercise prescription for aerobic and resistive training based on initial evaluation findings, risk stratification, comorbidities and participant's personal goals.   Expected Outcomes Achievement of increased cardiorespiratory fitness and enhanced flexibility, muscular endurance and strength shown through measurements of functional capacity and personal statement of participant.   Hypertension Yes   Intervention Provide education on lifestyle modifcations including regular physical activity/exercise, weight management, moderate sodium restriction and increased consumption of fresh fruit, vegetables, and  low fat dairy, alcohol moderation, and smoking cessation.;Monitor prescription use compliance.   Expected Outcomes Short Term: Continued assessment and intervention until BP is < 140/81mm HG in hypertensive participants. < 130/37mm HG in hypertensive participants with diabetes, heart failure or chronic kidney disease.;Long Term: Maintenance of blood pressure at goal levels.   Lipids Yes   Intervention Provide education and support for participant on nutrition & aerobic/resistive exercise along with prescribed medications to achieve LDL 70mg , HDL >40mg .   Expected Outcomes Short Term: Participant states understanding of desired cholesterol values and is compliant with  medications prescribed. Participant is following exercise prescription and nutrition guidelines.;Long Term: Cholesterol controlled with medications as prescribed, with individualized exercise RX and with personalized nutrition plan. Value goals: LDL < 70mg , HDL > 40 mg.      Core Components/Risk Factors/Patient Goals Review:    Core Components/Risk Factors/Patient Goals at Discharge (Final Review):    ITP Comments:     ITP Comments    Row Name 06/24/16 1225 06/24/16 1232         ITP Comments Roy Palmer said he knows what to eat and declined an individual appt with the Cardiac Rehab REgistered Dietician. ITP created during Medical review today. Documention of Dx Discharge Summary 03/29/2016 [facility]         Comments: Roy Palmer is relucent to start a morning Cardiac Rehab class since he drove 1 hr to work in [employer] for 10+ years. Roy Palmer will start in the 4pm Cardiac Rehab.

## 2016-06-26 ENCOUNTER — Encounter: Payer: Medicare Other | Admitting: Internal Medicine

## 2016-07-01 ENCOUNTER — Encounter: Payer: Medicare Other | Admitting: *Deleted

## 2016-07-01 DIAGNOSIS — Z9889 Other specified postprocedural states: Secondary | ICD-10-CM

## 2016-07-01 DIAGNOSIS — Z48812 Encounter for surgical aftercare following surgery on the circulatory system: Secondary | ICD-10-CM | POA: Diagnosis not present

## 2016-07-01 NOTE — Progress Notes (Signed)
Daily Session Note  Patient Details  Name: Roy Palmer MRN: [id number] Date of Birth: 1930/03/26 Referring Provider:   Flowsheet Row Cardiac Rehab from 06/24/2016 in [facility] Cardiac and Pulmonary Rehab  Referring Provider  End, Harrell Gave MD      Encounter Date: 07/01/2016  Check In:     Session Check In - 07/01/16 1638      Check-In   Staff Present Heath Lark, RN, BSN, Laveda Norman, BS, ACSM CEP, Exercise Physiologist;Carroll Enterkin, RN, BSN   Supervising physician immediately available to respond to emergencies See telemetry face sheet for immediately available ER MD   Medication changes reported     No   Fall or balance concerns reported    No   Warm-up and Cool-down Performed on first and last piece of equipment   Resistance Training Performed Yes   VAD Patient? No     Pain Assessment   Currently in Pain? No/denies         Goals Met:  Personal goals reviewed No report of cardiac concerns or symptoms Strength training completed today  Goals Unmet:  Not Applicable  Comments: First full day of exercise!  Patient was oriented to gym and equipment including functions, settings, policies, and procedures.  Patient's individual exercise prescription and treatment plan were reviewed.  All starting workloads were established based on the results of the 6 minute walk test done at initial orientation visit.  The plan for exercise progression was also introduced and progression will be customized based on patient's performance and goals.    Dr. Emily Filbert is Medical Director for [place] and [facility].

## 2016-07-03 ENCOUNTER — Encounter: Payer: Self-pay | Admitting: *Deleted

## 2016-07-03 DIAGNOSIS — Z9889 Other specified postprocedural states: Secondary | ICD-10-CM

## 2016-07-03 DIAGNOSIS — Z48812 Encounter for surgical aftercare following surgery on the circulatory system: Secondary | ICD-10-CM | POA: Diagnosis not present

## 2016-07-03 NOTE — Progress Notes (Signed)
Daily Session Note  Patient Details  Name: Roy Palmer MRN: [id number] Date of Birth: 1930-04-24 Referring Provider:   Flowsheet Row Cardiac Rehab from 06/24/2016 in [facility] Cardiac and Pulmonary Rehab  Referring Provider  End, Harrell Gave MD      Encounter Date: 07/03/2016  Check In:     Session Check In - 07/03/16 1718      Check-In   Location [facility]-Cardiac & Pulmonary Rehab   Staff Present Levell July RN BSN;Carroll Enterkin, RN, Vickki Hearing, BA, ACSM CEP, Exercise Physiologist   Supervising physician immediately available to respond to emergencies See telemetry face sheet for immediately available ER MD   Medication changes reported     No   Fall or balance concerns reported    No   Warm-up and Cool-down Performed on first and last piece of equipment   Resistance Training Performed Yes   VAD Patient? No     Pain Assessment   Currently in Pain? No/denies   Multiple Pain Sites No         Goals Met:  Independence with exercise equipment Exercise tolerated well No report of cardiac concerns or symptoms Strength training completed today  Goals Unmet:  Not Applicable  Comments: Pt able to follow exercise prescription today without complaint.  Will continue to monitor for progression.    Dr. Emily Filbert is Medical Director for [place] and [facility].

## 2016-07-03 NOTE — Progress Notes (Signed)
Cardiac Individual Treatment Plan  Patient Details  Name: Roy Palmer MRN: [id number] Date of Birth: 08-20-1929 Referring Provider:   Flowsheet Row Cardiac Rehab from 06/24/2016 in [facility] Cardiac and Pulmonary Rehab  Referring Provider  End, Harrell Gave MD      Initial Encounter Date:  Flowsheet Row Cardiac Rehab from 06/24/2016 in [facility] Cardiac and Pulmonary Rehab  Date  06/24/16  Referring Provider  End, Harrell Gave MD      Visit Diagnosis: S/P mitral valve repair  Patient's Home Medications on Admission:  Current Outpatient Prescriptions:  .  aspirin EC 81 MG tablet, Take 1 tablet (81 mg total) by mouth daily., Disp: , Rfl:  .  carvedilol (COREG) 3.125 MG tablet, Take 3.125 mg by mouth 2 (two) times daily with a meal., Disp: , Rfl:  .  omeprazole (PRILOSEC) 20 MG capsule, Take 20 mg by mouth daily., Disp: , Rfl:   Past Medical History: Past Medical History:  Diagnosis Date  . AAA (abdominal aortic aneurysm) without rupture ([place]) 03/12/2016   Small - measured 3.3 x 3.5 cm by CTA  . CKD (chronic kidney disease) stage 3, GFR 30-59 ml/min   . Essential hypertension   . Hyperlipidemia   . Hypertensive kidney disease   . Incidental pulmonary nodule 03/12/2016   Vague opacity right lung base requires radiographic follow up - index of suspicion is LOW  . Near syncope    Cardiogenic, related to an SVT and severe MR  . Non-sustained ventricular tachycardia ([place])   . S/P minimally invasive mitral valve repair 03/19/2016   Complex valvuloplasty including triangular resection of flail segment of posterior leaflet, chordal transposition x1, artificial Gore-tex neochord placement x4 and 34 mm Sorin Memo 3D ring annuloplasty via right mini thoracotomy approach with clipping of LA appendage  . Severe mitral regurgitation by prior echocardiogram 02/23/2016   Confirmed by TEE  . Syncope 03/15/2016  . Thoracic aortic aneurysm ([place]) 03/12/2016   Very very mild fusiform enlargement of distal  transverse and proximal descending thoracic aorta noted on CTA  . Trigeminal neuralgia     Tobacco Use: History  Smoking Status  . Never Smoker  Smokeless Tobacco  . Never Used    Labs: Recent Review Flowsheet Data    Labs for ITP Cardiac and Pulmonary Rehab Latest Ref Rng & Units 03/19/2016 03/19/2016 03/19/2016 03/20/2016 03/22/2016   Cholestrol 0 - 200 mg/dL - - - - -   LDLCALC 0 - 99 mg/dL - - - - -   HDL >40 mg/dL - - - - -   Trlycerides <150 mg/dL - - - - -   Hemoglobin A1c 4.8 - 5.6 % - - - - -   PHART 7.350 - 7.450 7.353 - 7.416 - -   PCO2ART 32.0 - 48.0 mmHg 40.1 - 29.9(L) - -   HCO3 20.0 - 28.0 mmol/L 22.4 - 19.3(L) - -   TCO2 0 - 100 mmol/L '24 24 20 23 24   '$ ACIDBASEDEF 0.0 - 2.0 mmol/L 3.0(H) - 4.0(H) - -   O2SAT % 96.0 - 98.0 - -       Exercise Target Goals:    Exercise Program Goal: Individual exercise prescription set with THRR, safety & activity barriers. Participant demonstrates ability to understand and report RPE using BORG scale, to self-measure pulse accurately, and to acknowledge the importance of the exercise prescription.  Exercise Prescription Goal: Starting with aerobic activity 30 plus minutes a day, 3 days per week for initial exercise prescription. Provide home  exercise prescription and guidelines that participant acknowledges understanding prior to discharge.  Activity Barriers & Risk Stratification:     Activity Barriers & Cardiac Risk Stratification - 06/24/16 1226      Activity Barriers & Cardiac Risk Stratification   Activity Barriers None   Cardiac Risk Stratification High      6 Minute Walk:     6 Minute Walk    Row Name 06/24/16 1320         6 Minute Walk   Phase Initial     Distance 860 feet     Walk Time 6 minutes     # of Rest Breaks 0     MPH 1.63     METS 2.23     RPE 9     VO2 Peak 5.76     Symptoms No     Resting HR 98 bpm     Resting BP 140/60     Max Ex. HR 112 bpm     Max Ex. BP 146/64     2 Minute  Post BP 134/66        Initial Exercise Prescription:     Initial Exercise Prescription - 06/24/16 1300      Date of Initial Exercise RX and Referring Provider   Date 06/24/16   Referring Provider End, Harrell Gave MD     Treadmill   MPH 0.8   Grade 0   Minutes 15   METs 1.6     Bike   Level 0.3   Minutes 15   METs 2.2     Arm Ergometer   Level 2   Minutes 15   METs 2     Prescription Details   Frequency (times per week) 3   Duration Progress to 45 minutes of aerobic exercise without signs/symptoms of physical distress     Intensity   THRR 40-80% of Max Heartrate 113-128   Ratings of Perceived Exertion 11-13   Perceived Dyspnea 0-4     Progression   Progression Continue to progress workloads to maintain intensity without signs/symptoms of physical distress.     Resistance Training   Training Prescription Yes   Weight 3 lbs   Reps 10-15      Perform Capillary Blood Glucose checks as needed.  Exercise Prescription Changes:     Exercise Prescription Changes    Row Name 06/24/16 1300             Exercise Review   Progression -  walk test results         Response to Exercise   Blood Pressure (Admit) 140/60       Blood Pressure (Exercise) 146/64       Blood Pressure (Exit) 134/66       Heart Rate (Admit) 98 bpm       Heart Rate (Exercise) 112 bpm       Heart Rate (Exit) 93 bpm       Oxygen Saturation (Exercise) 99 %       Rating of Perceived Exertion (Exercise) 9       Symptoms none          Exercise Comments:     Exercise Comments    Row Name 07/01/16 1810           Exercise Comments  First full day of exercise!  Patient was oriented to gym and equipment including functions, settings, policies, and procedures.  Patient's individual exercise prescription and treatment plan were reviewed.  All  starting workloads were established based on the results of the 6 minute walk test done at initial orientation visit.  The plan for exercise  progression was also introduced and progression will be customized based on patient's performance and goals.          Discharge Exercise Prescription (Final Exercise Prescription Changes):     Exercise Prescription Changes - 06/24/16 1300      Exercise Review   Progression --  walk test results     Response to Exercise   Blood Pressure (Admit) 140/60   Blood Pressure (Exercise) 146/64   Blood Pressure (Exit) 134/66   Heart Rate (Admit) 98 bpm   Heart Rate (Exercise) 112 bpm   Heart Rate (Exit) 93 bpm   Oxygen Saturation (Exercise) 99 %   Rating of Perceived Exertion (Exercise) 9   Symptoms none      Nutrition:  Target Goals: Understanding of nutrition guidelines, daily intake of sodium '1500mg'$ , cholesterol '200mg'$ , calories 30% from fat and 7% or less from saturated fats, daily to have 5 or more servings of fruits and vegetables.  Biometrics:     Pre Biometrics - 06/24/16 1331      Pre Biometrics   Height 5' 9.9" (1.775 m)   Weight 190 lb 1.6 oz (86.2 kg)   Waist Circumference 39 inches   Hip Circumference 42 inches   Waist to Hip Ratio 0.93 %   BMI (Calculated) 27.4   Single Leg Stand 2.65 seconds       Nutrition Therapy Plan and Nutrition Goals:     Nutrition Therapy & Goals - 06/24/16 1224      Nutrition Therapy   Diet --  Jenny Reichmann said he knows what to eat and declined an individual appt with the [place] REgistered Dietician.    Drug/Food Interactions Coumadin/Vit K     Intervention Plan   Intervention Prescribe, educate and counsel regarding individualized specific dietary modifications aiming towards targeted core components such as weight, hypertension, lipid management, diabetes, heart failure and other comorbidities.   Expected Outcomes Short Term Goal: Understand basic principles of dietary content, such as calories, fat, sodium, cholesterol and nutrients.      Nutrition Discharge: Rate Your Plate Scores:     Nutrition Assessments - 06/24/16  1229      Rate Your Plate Scores   Pre Score --  Jenny Reichmann did not complete this form.      MEDFICTS Scores   Pre Score --  [place] did not complete this form.       Nutrition Goals Re-Evaluation:   Psychosocial: Target Goals: Acknowledge presence or absence of depression, maximize coping skills, provide positive support system. Participant is able to verbalize types and ability to use techniques and skills needed for reducing stress and depression.  Initial Review & Psychosocial Screening:     Initial Psych Review & Screening - 06/24/16 [place]? Yes     Barriers   Psychosocial barriers to participate in program There are no identifiable barriers or psychosocial needs.     Screening Interventions   Interventions Encouraged to exercise      Quality of Life Scores:     Quality of Life - 06/24/16 1230      Quality of Life Scores   Health/Function Pre --  [place] did not complete this form.       PHQ-9: Recent Review Flowsheet Data    Depression screen [facility] 2/9 06/24/2016  Decreased Interest 0   Down, Depressed, Hopeless 0   PHQ - 2 Score 0   Altered sleeping 0   Tired, decreased energy 0   Change in appetite 0   Feeling bad or failure about yourself  0   Trouble concentrating 0   Moving slowly or fidgety/restless 0   Suicidal thoughts 0   PHQ-9 Score 0      Psychosocial Evaluation and Intervention:     Psychosocial Evaluation - 07/01/16 1721      Psychosocial Evaluation & Interventions   Interventions Encouraged to exercise with the program and follow exercise prescription   Comments Counselor met with Mr. Parcel today for initial psychosocial evaluation.  He is a well-adjusted 83 year old who had mitral valve repair last October.  He has a strong support system with a spouse of 3 years and a son and daughter who live close by, as well as Mr. Alfonso Patten is actively involved in his local church.  He states he sleeps well and has a  good appetite.  He denies a history of depression or anxiety or any current symptoms.  He reports typically being in a positive mood and has minimal stress in his life.  Mr. Alfonso Patten loves to fish and hunt, so his goals are to increase his stamina and strength to be able to enjoy these aspects of life.  He is encouraged to exercise consistently.  Staff will follow with him over the course of this program.        Psychosocial Re-Evaluation:   Vocational Rehabilitation: Provide vocational rehab assistance to qualifying candidates.   Vocational Rehab Evaluation & Intervention:     Vocational Rehab - 06/24/16 1227      Initial Vocational Rehab Evaluation & Intervention   Assessment shows need for Vocational Rehabilitation No      Education: Education Goals: Education classes will be provided on a weekly basis, covering required topics. Participant will state understanding/return demonstration of topics presented.  Learning Barriers/Preferences:     Learning Barriers/Preferences - 06/24/16 1226      Learning Barriers/Preferences   Learning Barriers None   Learning Preferences None      Education Topics: General Nutrition Guidelines/Fats and Fiber: -Group instruction provided by verbal, written material, models and posters to present the general guidelines for heart healthy nutrition. Gives an explanation and review of dietary fats and fiber.   Controlling Sodium/Reading Food Labels: -Group verbal and written material supporting the discussion of sodium use in heart healthy nutrition. Review and explanation with models, verbal and written materials for utilization of the food label.   Exercise Physiology & Risk Factors: - Group verbal and written instruction with models to review the exercise physiology of the cardiovascular system and associated critical values. Details cardiovascular disease risk factors and the goals associated with each risk factor.   Aerobic Exercise &  Resistance Training: - Gives group verbal and written discussion on the health impact of inactivity. On the components of aerobic and resistive training programs and the benefits of this training and how to safely progress through these programs.   Flexibility, Balance, General Exercise Guidelines: - Provides group verbal and written instruction on the benefits of flexibility and balance training programs. Provides general exercise guidelines with specific guidelines to those with heart or lung disease. Demonstration and skill practice provided. Flowsheet Row Cardiac Rehab from 07/01/2016 in [facility] Cardiac and Pulmonary Rehab  Date  07/01/16  Educator  [facility]  Instruction Review Code  2- meets goals/outcomes  Stress Management: - Provides group verbal and written instruction about the health risks of elevated stress, cause of high stress, and healthy ways to reduce stress.   Depression: - Provides group verbal and written instruction on the correlation between heart/lung disease and depressed mood, treatment options, and the stigmas associated with seeking treatment.   Anatomy & Physiology of the Heart: - Group verbal and written instruction and models provide basic cardiac anatomy and physiology, with the coronary electrical and arterial systems. Review of: AMI, Angina, Valve disease, Heart Failure, Cardiac Arrhythmia, Pacemakers, and the ICD.   Cardiac Procedures: - Group verbal and written instruction and models to describe the testing methods done to diagnose heart disease. Reviews the outcomes of the test results. Describes the treatment choices: Medical Management, Angioplasty, or Coronary Bypass Surgery.   Cardiac Medications: - Group verbal and written instruction to review commonly prescribed medications for heart disease. Reviews the medication, class of the drug, and side effects. Includes the steps to properly store meds and maintain the prescription regimen.   Go  Sex-Intimacy & Heart Disease, Get SMART - Goal Setting: - Group verbal and written instruction through game format to discuss heart disease and the return to sexual intimacy. Provides group verbal and written material to discuss and apply goal setting through the application of the S.M.A.R.T. Method.   Other Matters of the Heart: - Provides group verbal, written materials and models to describe Heart Failure, Angina, Valve Disease, and Diabetes in the realm of heart disease. Includes description of the disease process and treatment options available to the cardiac patient.   Exercise & Equipment Safety: - Individual verbal instruction and demonstration of equipment use and safety with use of the equipment. Flowsheet Row Cardiac Rehab from 07/01/2016 in [facility] Cardiac and Pulmonary Rehab  Date  06/24/16  Educator  C. EnterkinRN  Instruction Review Code  1- partially meets, needs review/practice      Infection Prevention: - Provides verbal and written material to individual with discussion of infection control including proper hand washing and proper equipment cleaning during exercise session. Flowsheet Row Cardiac Rehab from 07/01/2016 in [facility] Cardiac and Pulmonary Rehab  Date  06/24/16  Educator  C. EnterkinRN  Instruction Review Code  2- meets goals/outcomes      Falls Prevention: - Provides verbal and written material to individual with discussion of falls prevention and safety. Flowsheet Row Cardiac Rehab from 07/01/2016 in [facility] Cardiac and Pulmonary Rehab  Date  06/24/16  Educator  C. [place]  Instruction Review Code  2- meets goals/outcomes      Diabetes: - Individual verbal and written instruction to review signs/symptoms of diabetes, desired ranges of glucose level fasting, after meals and with exercise. Advice that pre and post exercise glucose checks will be done for 3 sessions at entry of program.    Knowledge Questionnaire Score:     Knowledge Questionnaire Score -  06/24/16 1227      Knowledge Questionnaire Score   Pre Score --  Duanne declined to do this questionnaire .      Core Components/Risk Factors/Patient Goals at Admission:     Personal Goals and Risk Factors at Admission - 06/24/16 1229      Core Components/Risk Factors/Patient Goals on Admission    Weight Management Yes;Weight Loss   Intervention Weight Management: Develop a combined nutrition and exercise program designed to reach desired caloric intake, while maintaining appropriate intake of nutrient and fiber, sodium and fats, and appropriate energy expenditure required for the weight  goal.;Weight Management: Provide education and appropriate resources to help participant work on and attain dietary goals.   Admit Weight 190 lb 1.6 oz (86.2 kg)   Goal Weight: Short Term 185 lb (83.9 kg)   Goal Weight: Long Term 180 lb (81.6 kg)   Expected Outcomes Short Term: Continue to assess and modify interventions until short term weight is achieved;Long Term: Adherence to nutrition and physical activity/exercise program aimed toward attainment of established weight goal;Weight Loss: Understanding of general recommendations for a balanced deficit meal plan, which promotes 1-2 lb weight loss per week and includes a negative energy balance of [phone] kcal/d;Understanding recommendations for meals to include 15-35% energy as protein, 25-35% energy from fat, 35-60% energy from carbohydrates, less than '200mg'$  of dietary cholesterol, 20-35 gm of total fiber daily;Understanding of distribution of calorie intake throughout the day with the consumption of 4-5 meals/snacks   Sedentary Yes   Intervention Provide advice, education, support and counseling about physical activity/exercise needs.;Develop an individualized exercise prescription for aerobic and resistive training based on initial evaluation findings, risk stratification, comorbidities and participant's personal goals.   Expected Outcomes Achievement of  increased cardiorespiratory fitness and enhanced flexibility, muscular endurance and strength shown through measurements of functional capacity and personal statement of participant.   Increase Strength and Stamina Yes   Intervention Provide advice, education, support and counseling about physical activity/exercise needs.;Develop an individualized exercise prescription for aerobic and resistive training based on initial evaluation findings, risk stratification, comorbidities and participant's personal goals.   Expected Outcomes Achievement of increased cardiorespiratory fitness and enhanced flexibility, muscular endurance and strength shown through measurements of functional capacity and personal statement of participant.   Hypertension Yes   Intervention Provide education on lifestyle modifcations including regular physical activity/exercise, weight management, moderate sodium restriction and increased consumption of fresh fruit, vegetables, and low fat dairy, alcohol moderation, and smoking cessation.;Monitor prescription use compliance.   Expected Outcomes Short Term: Continued assessment and intervention until BP is < 140/70m HG in hypertensive participants. < 130/82mHG in hypertensive participants with diabetes, heart failure or chronic kidney disease.;Long Term: Maintenance of blood pressure at goal levels.   Lipids Yes   Intervention Provide education and support for participant on nutrition & aerobic/resistive exercise along with prescribed medications to achieve LDL '70mg'$ , HDL >'40mg'$ .   Expected Outcomes Short Term: Participant states understanding of desired cholesterol values and is compliant with medications prescribed. Participant is following exercise prescription and nutrition guidelines.;Long Term: Cholesterol controlled with medications as prescribed, with individualized exercise RX and with personalized nutrition plan. Value goals: LDL < '70mg'$ , HDL > 40 mg.      Core Components/Risk  Factors/Patient Goals Review:    Core Components/Risk Factors/Patient Goals at Discharge (Final Review):    ITP Comments:     ITP Comments    Row Name 06/24/16 1225 06/24/16 1232 07/03/16 0648       ITP Comments Rishon said he knows what to eat and declined an individual appt with the Cardiac Rehab REgistered DiWarrenITP created during Medical review today. Documention of Dx Discharge Summary 03/29/2016 [facility] 30 day review. Continue with ITP unless directed changes per Medical Director review.  New to program        Comments:

## 2016-07-04 ENCOUNTER — Encounter: Payer: Medicare Other | Attending: Thoracic Surgery (Cardiothoracic Vascular Surgery)

## 2016-07-04 DIAGNOSIS — N183 Chronic kidney disease, stage 3 (moderate): Secondary | ICD-10-CM | POA: Diagnosis not present

## 2016-07-04 DIAGNOSIS — I129 Hypertensive chronic kidney disease with stage 1 through stage 4 chronic kidney disease, or unspecified chronic kidney disease: Secondary | ICD-10-CM | POA: Diagnosis not present

## 2016-07-04 DIAGNOSIS — Z79899 Other long term (current) drug therapy: Secondary | ICD-10-CM | POA: Insufficient documentation

## 2016-07-04 DIAGNOSIS — Z48812 Encounter for surgical aftercare following surgery on the circulatory system: Secondary | ICD-10-CM | POA: Diagnosis present

## 2016-07-04 DIAGNOSIS — E785 Hyperlipidemia, unspecified: Secondary | ICD-10-CM | POA: Insufficient documentation

## 2016-07-04 DIAGNOSIS — I712 Thoracic aortic aneurysm, without rupture: Secondary | ICD-10-CM | POA: Insufficient documentation

## 2016-07-04 DIAGNOSIS — Z7982 Long term (current) use of aspirin: Secondary | ICD-10-CM | POA: Diagnosis not present

## 2016-07-04 DIAGNOSIS — Z9889 Other specified postprocedural states: Secondary | ICD-10-CM

## 2016-07-04 NOTE — Progress Notes (Signed)
Daily Session Note  Patient Details  Name: Roy Palmer MRN: [id number] Date of Birth: [birth year] Referring Provider:   Flowsheet Row Cardiac Rehab from 06/24/2016 in [facility] Cardiac and Pulmonary Rehab  Referring Provider  End, Harrell Gave MD      Encounter Date: 07/04/2016  Check In:     Session Check In - 07/04/16 1639      Check-In   Location [facility]-Cardiac & Pulmonary Rehab   Staff Present Gerlene Burdock, RN, Vickki Hearing, BA, ACSM CEP, Exercise Physiologist;Kelly Amedeo Plenty, BS, ACSM CEP, Exercise Physiologist   Supervising physician immediately available to respond to emergencies See telemetry face sheet for immediately available ER MD   Medication changes reported     No   Fall or balance concerns reported    No   Warm-up and Cool-down Performed on first and last piece of equipment   Resistance Training Performed Yes   VAD Patient? No     Pain Assessment   Currently in Pain? No/denies   Multiple Pain Sites No         Goals Met:  Independence with exercise equipment Exercise tolerated well No report of cardiac concerns or symptoms Strength training completed today  Goals Unmet:  Not Applicable  Comments: Pt able to follow exercise prescription today without complaint.  Will continue to monitor for progression.    Dr. Emily Filbert is Medical Director for [place] and [facility].

## 2016-07-08 ENCOUNTER — Encounter: Payer: Medicare Other | Admitting: *Deleted

## 2016-07-08 DIAGNOSIS — Z48812 Encounter for surgical aftercare following surgery on the circulatory system: Secondary | ICD-10-CM | POA: Diagnosis not present

## 2016-07-08 DIAGNOSIS — Z9889 Other specified postprocedural states: Secondary | ICD-10-CM

## 2016-07-08 NOTE — Progress Notes (Signed)
Daily Session Note  Patient Details  Name: Roy Palmer MRN: [id number] Date of Birth: 22-Jan-1930 Referring Provider:   Flowsheet Row Cardiac Rehab from 06/24/2016 in [facility] Cardiac and Pulmonary Rehab  Referring Provider  End, Harrell Gave MD      Encounter Date: 07/08/2016  Check In:     Session Check In - 07/08/16 1739      Check-In   Location [facility]-Cardiac & Pulmonary Rehab   Staff Present Nyoka Cowden, RN, BSN, [place]ita [place], BS, ACSM CEP, Exercise Physiologist;Carroll Guy Begin, RN, BSN   Supervising physician immediately available to respond to emergencies See telemetry face sheet for immediately available ER MD   Medication changes reported     No   Fall or balance concerns reported    No   Warm-up and Cool-down Performed on first and last piece of equipment   Resistance Training Performed Yes   VAD Patient? No     Pain Assessment   Currently in Pain? No/denies   Multiple Pain Sites No         Goals Met:  Independence with exercise equipment Exercise tolerated well No report of cardiac concerns or symptoms Strength training completed today  Goals Unmet:  Not Applicable  Comments: Pt able to follow exercise prescription today without complaint.  Will continue to monitor for progression.    Dr. Emily Filbert is Medical Director for [place] and [facility].

## 2016-07-10 DIAGNOSIS — Z9889 Other specified postprocedural states: Secondary | ICD-10-CM

## 2016-07-10 DIAGNOSIS — Z48812 Encounter for surgical aftercare following surgery on the circulatory system: Secondary | ICD-10-CM | POA: Diagnosis not present

## 2016-07-10 NOTE — Progress Notes (Signed)
Daily Session Note  Patient Details  Name: Roy Palmer MRN: [id number] Date of Birth: 1929/10/21 Referring Provider:   Flowsheet Row Cardiac Rehab from 06/24/2016 in [facility] Cardiac and Pulmonary Rehab  Referring Provider  End, Harrell Gave MD      Encounter Date: 07/10/2016  Check In:     Session Check In - 07/10/16 1636      Check-In   Location [facility]-Cardiac & Pulmonary Rehab   Staff Present Levell July RN BSN;Jalysa Swopes, RN, Vickki Hearing, BA, ACSM CEP, Exercise Physiologist   Supervising physician immediately available to respond to emergencies See telemetry face sheet for immediately available ER MD   Medication changes reported     No   Fall or balance concerns reported    No   Warm-up and Cool-down Performed on first and last piece of equipment   Resistance Training Performed Yes   VAD Patient? No     Pain Assessment   Currently in Pain? No/denies   Multiple Pain Sites No         Goals Met:  Proper associated with RPD/PD & O2 Sat Exercise tolerated well  Goals Unmet:  Not Applicable  Comments:     Dr. Emily Filbert is Medical Director for [place] and [facility].

## 2016-07-11 ENCOUNTER — Encounter: Payer: Medicare Other | Admitting: *Deleted

## 2016-07-11 DIAGNOSIS — Z9889 Other specified postprocedural states: Secondary | ICD-10-CM

## 2016-07-11 DIAGNOSIS — Z48812 Encounter for surgical aftercare following surgery on the circulatory system: Secondary | ICD-10-CM | POA: Diagnosis not present

## 2016-07-11 NOTE — Progress Notes (Signed)
Daily Session Note  Patient Details  Name: Roy Palmer MRN: [id number] Date of Birth: November 26, 1929 Referring Provider:   Flowsheet Row Cardiac Rehab from 06/24/2016 in [facility] Cardiac and Pulmonary Rehab  Referring Provider  End, Harrell Gave MD      Encounter Date: 07/11/2016  Check In:     Session Check In - 07/11/16 1626      Check-In   Location [facility]-Cardiac & Pulmonary Rehab   Staff Present Levell July RN Moises Blood, BS, ACSM CEP, Exercise Physiologist;Amanda Oletta Darter, [place], ACSM CEP, Exercise Physiologist;Other   Supervising physician immediately available to respond to emergencies See telemetry face sheet for immediately available ER MD   Medication changes reported     No   Fall or balance concerns reported    No   Warm-up and Cool-down Performed on first and last piece of equipment   Resistance Training Performed Yes   VAD Patient? No     Pain Assessment   Currently in Pain? No/denies   Multiple Pain Sites No           Exercise Prescription Changes - 07/11/16 1100      Response to Exercise   Blood Pressure (Admit) 120/68   Blood Pressure (Exercise) 122/64   Blood Pressure (Exit) 132/80   Heart Rate (Admit) 104 bpm   Heart Rate (Exercise) 122 bpm   Heart Rate (Exit) 91 bpm   Rating of Perceived Exertion (Exercise) 12   Duration Progress to 45 minutes of aerobic exercise without signs/symptoms of physical distress   Intensity THRR unchanged     Resistance Training   Training Prescription Yes   Weight 3   Reps 10-15     Interval Training   Interval Training No     Treadmill   MPH 0.8   Grade 0   Minutes 15   METs 1.6     NuStep   Level 2   Minutes 15   METs 2      Goals Met:  Independence with exercise equipment Exercise tolerated well No report of cardiac concerns or symptoms Strength training completed today  Goals Unmet:  Not Applicable  Comments: Pt able to follow exercise prescription today without complaint.  Will continue to  monitor for progression.    Dr. Emily Filbert is Medical Director for [place] and [facility].

## 2016-07-15 ENCOUNTER — Encounter: Payer: Medicare Other | Admitting: *Deleted

## 2016-07-15 DIAGNOSIS — Z9889 Other specified postprocedural states: Secondary | ICD-10-CM

## 2016-07-15 DIAGNOSIS — Z48812 Encounter for surgical aftercare following surgery on the circulatory system: Secondary | ICD-10-CM | POA: Diagnosis not present

## 2016-07-15 NOTE — Progress Notes (Signed)
Daily Session Note  Patient Details  Name: STEPHON WEATHERS MRN: [id number] Date of Birth: Sep 07, 1929 Referring Provider:   Flowsheet Row Cardiac Rehab from 06/24/2016 in [facility] Cardiac and Pulmonary Rehab  Referring Provider  End, Harrell Gave MD      Encounter Date: 07/15/2016  Check In:     Session Check In - 07/15/16 1653      Check-In   Staff Present Nyoka Cowden, RN, BSN, MA;Susanne Bice, RN, BSN, CCRP;Carroll Enterkin, RN, BSN   Supervising physician immediately available to respond to emergencies See telemetry face sheet for immediately available ER MD   Medication changes reported     No   Fall or balance concerns reported    No   Warm-up and Cool-down Performed on first and last piece of equipment   Resistance Training Performed Yes   VAD Patient? No     Pain Assessment   Currently in Pain? No/denies         Goals Met:  Exercise tolerated well No report of cardiac concerns or symptoms Strength training completed today  Goals Unmet:  Not Applicable  Comments: Doing well with exercise prescription progression.    Dr. Emily Filbert is Medical Director for [place] and [facility].

## 2016-07-17 ENCOUNTER — Encounter: Payer: Medicare Other | Admitting: *Deleted

## 2016-07-17 DIAGNOSIS — Z9889 Other specified postprocedural states: Secondary | ICD-10-CM

## 2016-07-17 DIAGNOSIS — Z48812 Encounter for surgical aftercare following surgery on the circulatory system: Secondary | ICD-10-CM | POA: Diagnosis not present

## 2016-07-17 NOTE — Progress Notes (Signed)
Daily Session Note  Patient Details  Name: Roy Palmer MRN: [id number] Date of Birth: 19-Jan-1930 Referring Provider:   Flowsheet Row Cardiac Rehab from 06/24/2016 in [facility] Cardiac and Pulmonary Rehab  Referring Provider  End, Harrell Gave MD      Encounter Date: 07/17/2016  Check In:     Session Check In - 07/17/16 1644      Check-In   Location [facility]-Cardiac & Pulmonary Rehab   Staff Present Gerlene Burdock, RN, Vickki Hearing, BA, ACSM CEP, Exercise Physiologist;Patricia Surles RN BSN   Supervising physician immediately available to respond to emergencies See telemetry face sheet for immediately available ER MD   Medication changes reported     No   Fall or balance concerns reported    No   Warm-up and Cool-down Performed on first and last piece of equipment   Resistance Training Performed Yes   VAD Patient? No     Pain Assessment   Currently in Pain? No/denies         Goals Met:  Proper associated with RPD/PD & O2 Sat Exercise tolerated well  Goals Unmet:  Not Applicable  Comments:     Dr. Emily Filbert is Medical Director for [place] and [facility].

## 2016-07-18 DIAGNOSIS — Z48812 Encounter for surgical aftercare following surgery on the circulatory system: Secondary | ICD-10-CM | POA: Diagnosis not present

## 2016-07-18 DIAGNOSIS — Z9889 Other specified postprocedural states: Secondary | ICD-10-CM

## 2016-07-18 NOTE — Progress Notes (Signed)
Daily Session Note  Patient Details  Name: QUY LOTTS MRN: [id number] Date of Birth: April 26, 1930 Referring Provider:   Flowsheet Row Cardiac Rehab from 06/24/2016 in [facility] Cardiac and Pulmonary Rehab  Referring Provider  End, Harrell Gave MD      Encounter Date: 07/18/2016  Check In:     Session Check In - 07/18/16 1639      Check-In   Location [facility]-Cardiac & Pulmonary Rehab   Staff Present Levell July RN Moises Blood, BS, ACSM CEP, Exercise Physiologist;Irineo Gaulin Oletta Darter, [place], ACSM CEP, Exercise Physiologist   Supervising physician immediately available to respond to emergencies See telemetry face sheet for immediately available ER MD   Medication changes reported     No   Fall or balance concerns reported    No   Warm-up and Cool-down Performed on first and last piece of equipment   Resistance Training Performed Yes   VAD Patient? No     Pain Assessment   Currently in Pain? No/denies   Multiple Pain Sites No           Exercise Prescription Changes - 07/18/16 1000      Home Exercise Plan   Plans to continue exercise at Home   Frequency Add 2 additional days to program exercise sessions.      Goals Met:  Independence with exercise equipment Exercise tolerated well No report of cardiac concerns or symptoms Strength training completed today  Goals Unmet:  Not Applicable  Comments: Pt able to follow exercise prescription today without complaint.  Will continue to monitor for progression.    Dr. Emily Filbert is Medical Director for [place] and [facility].

## 2016-07-22 ENCOUNTER — Encounter: Payer: Medicare Other | Admitting: *Deleted

## 2016-07-22 DIAGNOSIS — Z9889 Other specified postprocedural states: Secondary | ICD-10-CM

## 2016-07-22 DIAGNOSIS — Z48812 Encounter for surgical aftercare following surgery on the circulatory system: Secondary | ICD-10-CM | POA: Diagnosis not present

## 2016-07-22 NOTE — Progress Notes (Signed)
Daily Session Note  Patient Details  Name: KOKI BUXTON MRN: [id number] Date of Birth: 12-12-1929 Referring Provider:   Flowsheet Row Cardiac Rehab from 06/24/2016 in [facility] Cardiac and Pulmonary Rehab  Referring Provider  End, Harrell Gave MD      Encounter Date: 07/22/2016  Check In:     Session Check In - 07/22/16 1624      Check-In   Location [facility]-Cardiac & Pulmonary Rehab   Staff Present Gerlene Burdock, RN, Moises Blood, BS, ACSM CEP, Exercise Physiologist;Susanne Bice, RN, BSN, CCRP   Supervising physician immediately available to respond to emergencies See telemetry face sheet for immediately available ER MD   Medication changes reported     No   Fall or balance concerns reported    No   Warm-up and Cool-down Performed on first and last piece of equipment   Resistance Training Performed Yes   VAD Patient? No     Pain Assessment   Currently in Pain? No/denies   Multiple Pain Sites No         Goals Met:  No report of cardiac concerns or symptoms Strength training completed today  Goals Unmet:  Multiple PVC's today, exercise session shortened. No symptoms reported.   Comments:  See ITP Comments today.   Shortened exercise for EKG PVC's noted.    Dr. Emily Filbert is Medical Director for [place] and [facility].

## 2016-07-23 ENCOUNTER — Telehealth: Payer: Self-pay | Admitting: Internal Medicine

## 2016-07-23 ENCOUNTER — Other Ambulatory Visit: Payer: Self-pay

## 2016-07-23 DIAGNOSIS — I493 Ventricular premature depolarization: Secondary | ICD-10-CM

## 2016-07-23 NOTE — Telephone Encounter (Signed)
Pt was in heart trak this morning, and states he had "extra pulses" while he was there. They advised him to call DR. End.

## 2016-07-23 NOTE — Telephone Encounter (Signed)
Mardene Celeste, RN notified patient of Dr Serita Kyle orders. See additional telephone encounter from today.  Rhythm strips from [facility] cardiac rehab will be scanned into EPIC to Dr Lubertha Basque in-basket  for his review along with this encounter.  This message routed to Dr Ladona Ridgel and his nurse.

## 2016-07-23 NOTE — Telephone Encounter (Signed)
[facility] Cardiac Rehab February EKG readings reviewed by Dr. Okey Dupre. BMET and magnesium labs ordered and f/u w/Dr. Ladona Ridgel. Reviewed recommendations w/pt who is agreeable to labs tomorrow at [facility] outpatient lab before cardiac rehab with Feb 28, 9:45am f/u w/Dr. Ladona Ridgel.

## 2016-07-23 NOTE — Telephone Encounter (Signed)
Returned call to patient. He stated he has been having "extra beats" during exercise at cardiac rehab. Patient stated he does not feel them but the nurses see them on the monitor. He has no complaints, denies chest pain, palpitations, dizziness.  Received fax from [facility] Cardiac rehab with patient's EKG readings for February. Carvedilol was discontinued on 06/07/16 by Dr Ladona Ridgel because it was causing the patient fatigue.  EKG Readings placed in Dr Serita Kyle box for review and encounter routed to him for advice.

## 2016-07-23 NOTE — Telephone Encounter (Signed)
Got it. See Dr. Serita Kyle note. I agree. GT

## 2016-07-23 NOTE — Telephone Encounter (Signed)
I have reviewed the strips. He is having multifocal PVCs. I recommend that he come in for a BMP and Mg to ensure that his electrolytes are normal. Please forward this information and the rhythm strips to Dr. Ladona Ridgel. I will defer management of Roy Palmer' complex arrhythmias to him. Thanks.

## 2016-07-24 ENCOUNTER — Encounter: Payer: Self-pay | Admitting: *Deleted

## 2016-07-24 ENCOUNTER — Other Ambulatory Visit
Admission: RE | Admit: 2016-07-24 | Discharge: 2016-07-24 | Disposition: A | Discharge status: Home / Self Care | Payer: Medicare Other | Source: Ambulatory Visit | Attending: Internal Medicine | Admitting: Internal Medicine

## 2016-07-24 ENCOUNTER — Telehealth: Payer: Self-pay | Admitting: *Deleted

## 2016-07-24 VITALS — BP 132/70 | HR 90

## 2016-07-24 DIAGNOSIS — Z9889 Other specified postprocedural states: Secondary | ICD-10-CM

## 2016-07-24 DIAGNOSIS — Z48812 Encounter for surgical aftercare following surgery on the circulatory system: Secondary | ICD-10-CM | POA: Diagnosis not present

## 2016-07-24 DIAGNOSIS — I493 Ventricular premature depolarization: Secondary | ICD-10-CM | POA: Diagnosis present

## 2016-07-24 LAB — BASIC METABOLIC PANEL
Anion gap: 6 (ref 5–15)
BUN: 25 mg/dL — ABNORMAL HIGH (ref 6–20)
CO2: 24 mmol/L (ref 22–32)
Calcium: 8.5 mg/dL — ABNORMAL LOW (ref 8.9–10.3)
Chloride: 108 mmol/L (ref 101–111)
Creatinine, Ser: 1.59 mg/dL — ABNORMAL HIGH (ref 0.61–1.24)
GFR calc Af Amer: 44 mL/min — ABNORMAL LOW (ref >60)
GFR calc non Af Amer: 38 mL/min — ABNORMAL LOW (ref >60)
Glucose, Bld: 145 mg/dL — ABNORMAL HIGH (ref 65–99)
Potassium: 4.4 mmol/L (ref 3.5–5.1)
Sodium: 138 mmol/L (ref 135–145)

## 2016-07-24 LAB — MAGNESIUM: Magnesium: 2.5 mg/dL — ABNORMAL HIGH (ref 1.7–2.4)

## 2016-07-24 NOTE — Progress Notes (Signed)
Daily Session Note  Patient Details  Name: Roy YSAGUIRRE MRN: [id number] Date of Birth: Sep 15, 1929 Referring Provider:   Flowsheet Row Cardiac Rehab from 06/24/2016 in [facility] Cardiac and Pulmonary Rehab  Referring Provider  End, Harrell Gave MD      Encounter Date: 07/24/2016  Check In:     Session Check In - 07/24/16 1719      Check-In   Warm-up and Cool-down Performed on first and last piece of equipment   Resistance Training Performed No   VAD Patient? No     Pain Assessment   Currently in Pain? No/denies   Multiple Pain Sites No         Goals Met:   Pt noted to have multiple PVC's with minimal effort today.  No discomfort or distress noted.  Goals Unmet:  Not Applicable  Comments:  Pt saw physician earlier today and blood work drawn.  Awaiting results and will monitor closely.    Dr. Emily Filbert is Medical Director for [place] and [facility].

## 2016-07-24 NOTE — Telephone Encounter (Signed)
I left a vm to check on [employer]. He had frequent PVC's but had not c/o during his last Cardiac Rehab visit. I actually stopped him from exercising and only allowed him to do some brief stretching seated at the end of Cardiac Rehab.

## 2016-07-25 ENCOUNTER — Telehealth: Payer: Self-pay | Admitting: *Deleted

## 2016-07-25 ENCOUNTER — Other Ambulatory Visit: Payer: Self-pay | Admitting: *Deleted

## 2016-07-25 DIAGNOSIS — Z48812 Encounter for surgical aftercare following surgery on the circulatory system: Secondary | ICD-10-CM | POA: Diagnosis not present

## 2016-07-25 DIAGNOSIS — Z9889 Other specified postprocedural states: Secondary | ICD-10-CM

## 2016-07-25 NOTE — Progress Notes (Signed)
Daily Session Note  Patient Details  Name: Roy Palmer MRN: [id number] Date of Birth: 03-09-1930 Referring Provider:   Flowsheet Row Cardiac Rehab from 06/24/2016 in [facility] Cardiac and Pulmonary Rehab  Referring Provider  End, Harrell Gave MD      Encounter Date: 07/25/2016  Check In:     Session Check In - 07/25/16 1718      Check-In   Location [facility]-Cardiac & Pulmonary Rehab   Staff Present Earlean Shawl, BS, ACSM CEP, Exercise Physiologist;Carroll Enterkin, RN, Vickki Hearing, BA, ACSM CEP, Exercise Physiologist   Supervising physician immediately available to respond to emergencies See telemetry face sheet for immediately available ER MD   Medication changes reported     No   Fall or balance concerns reported    No   Warm-up and Cool-down Performed on first and last piece of equipment   Resistance Training Performed Yes   VAD Patient? No     Pain Assessment   Currently in Pain? No/denies         Goals Met:  Independence with exercise equipment Exercise tolerated well No report of cardiac concerns or symptoms Strength training completed today  Goals Unmet:  Not Applicable  Comments:Pt able to follow exercise prescription today without complaint.  Will continue to monitor for progression.     Dr. Emily Filbert is Medical Director for [place] and [facility].

## 2016-07-25 NOTE — Telephone Encounter (Signed)
Received rhythm strips from [facility] Cardiac Rehab from visit there on 07/24/16. PVC's were noted during rest and exercise. According to cardiac rehab, patient had no complaints of symptoms. Patient has OV with Dr Ladona Ridgel on 07/31/16 at 0945. Rhythm strips to be scanned directly to Dr Lewayne Bunting for his review and advice.

## 2016-07-29 ENCOUNTER — Encounter: Payer: Medicare Other | Admitting: *Deleted

## 2016-07-29 DIAGNOSIS — Z9889 Other specified postprocedural states: Secondary | ICD-10-CM

## 2016-07-29 DIAGNOSIS — Z48812 Encounter for surgical aftercare following surgery on the circulatory system: Secondary | ICD-10-CM | POA: Diagnosis not present

## 2016-07-29 NOTE — Progress Notes (Signed)
Daily Session Note  Patient Details  Name: Roy Palmer MRN: [id number] Date of Birth: 27-Mar-1930 Referring Provider:   Flowsheet Row Cardiac Rehab from 06/24/2016 in [facility] Cardiac and Pulmonary Rehab  Referring Provider  End, Harrell Gave MD      Encounter Date: 07/29/2016  Check In:     Session Check In - 07/29/16 1633      Check-In   Location [facility]-Cardiac & Pulmonary Rehab   Staff Present Earlean Shawl, BS, ACSM CEP, Exercise Physiologist;Susanne Bice, RN, BSN, CCRP;Jacynda Brunke, RN, BSN   Supervising physician immediately available to respond to emergencies See telemetry face sheet for immediately available ER MD   Medication changes reported     No   Fall or balance concerns reported    No   Warm-up and Cool-down Performed on first and last piece of equipment   Resistance Training Performed Yes   VAD Patient? No     Pain Assessment   Currently in Pain? No/denies   Multiple Pain Sites No         History  Smoking Status  . Never Smoker  Smokeless Tobacco  . Never Used    Goals Met:  Proper associated with RPD/PD & O2 Sat Exercise tolerated well  Goals Unmet:  Not Applicable  Comments:     Dr. Emily Filbert is Medical Director for [name].

## 2016-07-30 ENCOUNTER — Encounter: Payer: Self-pay | Admitting: Unknown Physician Specialty

## 2016-07-30 ENCOUNTER — Ambulatory Visit
Admission: RE | Admit: 2016-07-30 | Discharge: 2016-07-30 | Disposition: A | Discharge status: Home / Self Care | Payer: Medicare Other | Source: Ambulatory Visit | Attending: Unknown Physician Specialty | Admitting: Unknown Physician Specialty

## 2016-07-30 ENCOUNTER — Ambulatory Visit (INDEPENDENT_AMBULATORY_CARE_PROVIDER_SITE_OTHER): Payer: Medicare Other | Admitting: Unknown Physician Specialty

## 2016-07-30 VITALS — BP 126/90 | HR 68 | Temp 98.4°F | Ht 69.5 in | Wt 195.4 lb

## 2016-07-30 DIAGNOSIS — R0602 Shortness of breath: Secondary | ICD-10-CM | POA: Diagnosis not present

## 2016-07-30 DIAGNOSIS — I509 Heart failure, unspecified: Secondary | ICD-10-CM | POA: Insufficient documentation

## 2016-07-30 DIAGNOSIS — I700 Atherosclerosis of aorta: Secondary | ICD-10-CM | POA: Insufficient documentation

## 2016-07-30 DIAGNOSIS — N183 Chronic kidney disease, stage 3 unspecified: Secondary | ICD-10-CM

## 2016-07-30 DIAGNOSIS — Z952 Presence of prosthetic heart valve: Secondary | ICD-10-CM | POA: Diagnosis not present

## 2016-07-30 LAB — UA/M W/RFLX CULTURE, ROUTINE
Bilirubin, UA: NEGATIVE
Glucose, UA: NEGATIVE
Ketones, UA: NEGATIVE
Leukocytes, UA: NEGATIVE
Nitrite, UA: NEGATIVE
Protein, UA: NEGATIVE
RBC, UA: NEGATIVE
Specific Gravity, UA: 1.015 (ref 1.005–1.030)
Urobilinogen, Ur: 0.2 mg/dL (ref 0.2–1.0)
pH, UA: 5.5 (ref 5.0–7.5)

## 2016-07-30 MED ORDER — LISINOPRIL 2.5 MG PO TABS: 2.5000 mg | ORAL_TABLET | Freq: Every day | ORAL | 1 refills | Status: DC

## 2016-07-30 NOTE — Addendum Note (Signed)
Addended by: Gabriel Cirri on: 07/30/2016 11:05 AM   Modules accepted: Orders

## 2016-07-30 NOTE — Assessment & Plan Note (Signed)
Recheck GFR, UA, CBC, and Vitamin D.  Start Lisinopril 2.5 mg.

## 2016-07-30 NOTE — Progress Notes (Addendum)
BP 126/90 (BP Location: Left Arm, Cuff Size: Large)   Pulse 68   Temp 98.4 F (36.9 C)   Ht 5' 9.5" (1.765 m)   Wt 195 lb 6.4 oz (88.6 kg)   SpO2 98%   BMI 28.44 kg/m    Subjective:    Patient ID: Roy Palmer, male    DOB: 04-Aug-1929, 83 y.o.   MRN: [id number]  HPI: Roy Palmer is a 83 y.o. male  Chief Complaint  Patient presents with  . Follow-up    pt states he is here to have kidney checked per the heart rate clinic   Chronic kidney disease Pt is s/p mitral valve repair and noted to have CKD with last GFR of 44 on 07/24/2016.  He was asked to see his primary car provider for f/u.  He is not on any medications.  Reviewed hospital notes.    SOB States in the last 2 weeks wakes up in the middle of the night with some SOB.  No history of smoking.  Resolving pulmonary effusions last chest x-ray but does have a low risk pulmonary nodule noted on problem list.    Social History   Social History  . Marital status: Married    Spouse name: N/A  . Number of children: N/A  . Years of education: N/A   Occupational History  . Not on file.   Social History Main Topics  . Smoking status: Never Smoker  . Smokeless tobacco: Never Used  . Alcohol use No  . Drug use: No  . Sexual activity: Not on file   Other Topics Concern  . Not on file   Social History Narrative  . No narrative on file   Family History  Problem Relation Age of Onset  . Hypertension Mother   . Stroke Mother   . Heart disease Father   . Heart attack Father   . Dementia Sister   . Retinoblastoma Son   . COPD Neg Hx   . Cancer Neg Hx   . Diabetes Neg Hx     Relevant past medical, surgical, family and social history reviewed and updated as indicated. Interim medical history since our last visit reviewed. Allergies and medications reviewed and updated.  Review of Systems  Constitutional: Negative.   HENT: Negative.   Eyes: Negative.   Cardiovascular: Negative.   Gastrointestinal: Negative.    Endocrine: Negative.   Genitourinary: Negative.   Skin: Negative.   Allergic/Immunologic: Negative.   Neurological: Negative.   Hematological: Negative.   Psychiatric/Behavioral: Negative.     Per HPI unless specifically indicated above     Objective:    BP 126/90 (BP Location: Left Arm, Cuff Size: Large)   Pulse 68   Temp 98.4 F (36.9 C)   Ht 5' 9.5" (1.765 m)   Wt 195 lb 6.4 oz (88.6 kg)   SpO2 98%   BMI 28.44 kg/m   Wt Readings from Last 3 Encounters:  07/30/16 195 lb 6.4 oz (88.6 kg)  06/24/16 190 lb 1.6 oz (86.2 kg)  06/10/16 193 lb 4.8 oz (87.7 kg)    Physical Exam  Constitutional: He is oriented to person, place, and time. He appears well-developed and well-nourished. No distress.  HENT:  Head: Normocephalic and atraumatic.  Eyes: Conjunctivae and lids are normal. Right eye exhibits no discharge. Left eye exhibits no discharge. No scleral icterus.  Neck: Normal range of motion. Neck supple. No JVD present. Carotid bruit is not present.  Cardiovascular: Normal  rate, regular rhythm and normal heart sounds.   Pulmonary/Chest: Effort normal and breath sounds normal. No respiratory distress.  Abdominal: Normal appearance. There is no splenomegaly or hepatomegaly.  Musculoskeletal: Normal range of motion.  Neurological: He is alert and oriented to person, place, and time.  Skin: Skin is warm, dry and intact. No rash noted. No pallor.  Psychiatric: He has a normal mood and affect. His behavior is normal. Judgment and thought content normal.    Results for orders placed or performed during the hospital encounter of 07/24/16  Basic Metabolic Panel (BMET)  Result Value Ref Range   Sodium 138 135 - 145 mmol/L   Potassium 4.4 3.5 - 5.1 mmol/L   Chloride 108 101 - 111 mmol/L   CO2 24 22 - 32 mmol/L   Glucose, Bld 145 (H) 65 - 99 mg/dL   BUN 25 (H) 6 - 20 mg/dL   Creatinine, Ser 7.93 (H) 0.61 - 1.24 mg/dL   Calcium 8.5 (L) 8.9 - 10.3 mg/dL   GFR calc non Af Amer 38  (L) >60 mL/min   GFR calc Af Amer 44 (L) >60 mL/min   Anion gap 6 5 - 15  Magnesium  Result Value Ref Range   Magnesium 2.5 (H) 1.7 - 2.4 mg/dL      Assessment & Plan:   Problem List Items Addressed This Visit      Unprioritized   CKD (chronic kidney disease), stage III    Recheck GFR, UA, CBC, and Vitamin D.  Start Lisinopril 2.5 mg.        Relevant Medications   lisinopril (PRINIVIL,ZESTRIL) 2.5 MG tablet    Other Visit Diagnoses    SOB (shortness of breath)    -  Primary   Relevant Orders   DG Chest 2 View       Follow up plan: Return in about 4 weeks (around 08/27/2016).

## 2016-07-31 ENCOUNTER — Encounter: Payer: Self-pay | Admitting: *Deleted

## 2016-07-31 ENCOUNTER — Ambulatory Visit (INDEPENDENT_AMBULATORY_CARE_PROVIDER_SITE_OTHER): Payer: Medicare Other | Admitting: Internal Medicine

## 2016-07-31 ENCOUNTER — Encounter: Payer: Self-pay | Admitting: Internal Medicine

## 2016-07-31 VITALS — BP 132/88 | HR 90 | Ht 71.0 in | Wt 194.4 lb

## 2016-07-31 DIAGNOSIS — Z95810 Presence of automatic (implantable) cardiac defibrillator: Secondary | ICD-10-CM

## 2016-07-31 DIAGNOSIS — I472 Ventricular tachycardia, unspecified: Secondary | ICD-10-CM

## 2016-07-31 DIAGNOSIS — Z48812 Encounter for surgical aftercare following surgery on the circulatory system: Secondary | ICD-10-CM | POA: Diagnosis not present

## 2016-07-31 DIAGNOSIS — I428 Other cardiomyopathies: Secondary | ICD-10-CM

## 2016-07-31 DIAGNOSIS — Z9889 Other specified postprocedural states: Secondary | ICD-10-CM

## 2016-07-31 LAB — CBC WITH DIFFERENTIAL/PLATELET
Basophils Absolute: 0 10*3/uL (ref 0.0–0.2)
Basos: 0 %
EOS (ABSOLUTE): 0.2 10*3/uL (ref 0.0–0.4)
Eos: 3 %
Hematocrit: 40.5 % (ref 37.5–51.0)
Hemoglobin: 13 g/dL (ref 13.0–17.7)
Immature Grans (Abs): 0 10*3/uL (ref 0.0–0.1)
Immature Granulocytes: 0 %
Lymphocytes Absolute: 1.9 10*3/uL (ref 0.7–3.1)
Lymphs: 24 %
MCH: 28.7 pg (ref 26.6–33.0)
MCHC: 32.1 g/dL (ref 31.5–35.7)
MCV: 89 fL (ref 79–97)
Monocytes Absolute: 0.6 10*3/uL (ref 0.1–0.9)
Monocytes: 8 %
Neutrophils Absolute: 5.1 10*3/uL (ref 1.4–7.0)
Neutrophils: 65 %
Platelets: 188 10*3/uL (ref 150–379)
RBC: 4.53 x10E6/uL (ref 4.14–5.80)
RDW: 14.8 % (ref 12.3–15.4)
WBC: 7.8 10*3/uL (ref 3.4–10.8)

## 2016-07-31 LAB — CUP PACEART INCLINIC DEVICE CHECK
Battery Remaining Longevity: 131 mo
Battery Voltage: 3.09 V
Brady Statistic AP VP Percent: 0 %
Brady Statistic AP VS Percent: 0 %
Brady Statistic AS VP Percent: 0.09 %
Brady Statistic AS VS Percent: 99.9 %
Brady Statistic RA Percent Paced: 0.01 %
Brady Statistic RV Percent Paced: 0.09 %
Date Time Interrogation Session: 20180228112150
HighPow Impedance: 64 Ohm
Implantable Lead Implant Date: 20171025
Implantable Lead Implant Date: 20171025
Implantable Lead Location: 753859
Implantable Lead Location: 753860
Implantable Lead Model: 5076
Implantable Pulse Generator Implant Date: 20171025
Lead Channel Impedance Value: 361 Ohm
Lead Channel Impedance Value: 456 Ohm
Lead Channel Impedance Value: 456 Ohm
Lead Channel Pacing Threshold Amplitude: 0.625 V
Lead Channel Pacing Threshold Amplitude: 0.75 V
Lead Channel Pacing Threshold Pulse Width: 0.4 ms
Lead Channel Pacing Threshold Pulse Width: 0.4 ms
Lead Channel Sensing Intrinsic Amplitude: 2.875 mV
Lead Channel Sensing Intrinsic Amplitude: 3.375 mV
Lead Channel Sensing Intrinsic Amplitude: 4.875 mV
Lead Channel Sensing Intrinsic Amplitude: 5 mV
Lead Channel Setting Pacing Amplitude: 2 V
Lead Channel Setting Pacing Amplitude: 2.5 V
Lead Channel Setting Pacing Pulse Width: 0.4 ms
Lead Channel Setting Sensing Sensitivity: 0.3 mV

## 2016-07-31 LAB — VITAMIN D 25 HYDROXY (VIT D DEFICIENCY, FRACTURES): Vit D, 25-Hydroxy: 21.3 ng/mL — ABNORMAL LOW (ref 30.0–100.0)

## 2016-07-31 LAB — COMPREHENSIVE METABOLIC PANEL
ALT: 15 IU/L (ref 0–44)
AST: 16 IU/L (ref 0–40)
Albumin/Globulin Ratio: 1.7 (ref 1.2–2.2)
Albumin: 4 g/dL (ref 3.5–4.7)
Alkaline Phosphatase: 64 IU/L (ref 39–117)
BUN/Creatinine Ratio: 14 (ref 10–24)
BUN: 21 mg/dL (ref 8–27)
Bilirubin Total: 0.4 mg/dL (ref 0.0–1.2)
CO2: 24 mmol/L (ref 18–29)
Calcium: 8.7 mg/dL (ref 8.6–10.2)
Chloride: 104 mmol/L (ref 96–106)
Creatinine, Ser: 1.45 mg/dL — ABNORMAL HIGH (ref 0.76–1.27)
GFR calc Af Amer: 51 mL/min/{1.73_m2} — ABNORMAL LOW (ref >59)
GFR calc non Af Amer: 44 mL/min/{1.73_m2} — ABNORMAL LOW (ref >59)
Globulin, Total: 2.4 g/dL (ref 1.5–4.5)
Glucose: 115 mg/dL — ABNORMAL HIGH (ref 65–99)
Potassium: 4.7 mmol/L (ref 3.5–5.2)
Sodium: 142 mmol/L (ref 134–144)
Total Protein: 6.4 g/dL (ref 6.0–8.5)

## 2016-07-31 NOTE — Progress Notes (Signed)
HPI Roy Palmer returns today for followup. He is a pleasant 83 yo man with mitral valve surgery, VT, and sinus bradycardia who underwent ICD implant after his mitral valve surgery secondary to recurrent VT. He has done well in the interim. He denies chest pain or sob. In VT he felt weak but never had syncope. He has done very well on after load reduction altogether. He could not take metoprolol due to weaknes No chest pain or sob.  Allergies  Allergen Reactions  . Amiodarone Other (See Comments)    Terrible dreams/nightmares  . Lyrica [Pregabalin] Nausea And Vomiting     Current Outpatient Prescriptions  Medication Sig Dispense Refill  . aspirin EC 81 MG tablet Take 1 tablet (81 mg total) by mouth daily.    Marland Kitchen lisinopril (PRINIVIL,ZESTRIL) 2.5 MG tablet Take 1 tablet (2.5 mg total) by mouth daily. 90 tablet 1   No current facility-administered medications for this visit.      Past Medical History:  Diagnosis Date  . AAA (abdominal aortic aneurysm) without rupture (HCC) 03/12/2016   Small - measured 3.3 x 3.5 cm by CTA  . CKD (chronic kidney disease) stage 3, GFR 30-59 ml/min   . Essential hypertension   . Hyperlipidemia   . Hypertensive kidney disease   . Incidental pulmonary nodule 03/12/2016   Vague opacity right lung base requires radiographic follow up - index of suspicion is LOW  . Near syncope    Cardiogenic, related to an SVT and severe MR  . Non-sustained ventricular tachycardia (HCC)   . S/P minimally invasive mitral valve repair 03/19/2016   Complex valvuloplasty including triangular resection of flail segment of posterior leaflet, chordal transposition x1, artificial Gore-tex neochord placement x4 and 34 mm Sorin Memo 3D ring annuloplasty via right mini thoracotomy approach with clipping of LA appendage  . Severe mitral regurgitation by prior echocardiogram 02/23/2016   Confirmed by TEE  . Syncope 03/15/2016  . Thoracic aortic aneurysm (HCC) 03/12/2016   Very very mild fusiform enlargement of distal transverse and proximal descending thoracic aorta noted on CTA  . Trigeminal neuralgia     ROS:   All systems reviewed and negative except as noted in the HPI.   Past Surgical History:  Procedure Laterality Date  . CARDIAC CATHETERIZATION N/A 02/23/2016   Procedure: Right/Left Heart Cath and Coronary Angiography;  Surgeon: [name], MD;  Location: [facility] INVASIVE CV LAB;  Service: Cardiovascular: Angiographic minimal coronary disease. Normal left and right heart Pressures. Decreased CO/CI in settng of frequent PVCs & Severe MR.  Marland Kitchen CATARACT EXTRACTION, BILATERAL    . COLONOSCOPY  08/2005  . CYST EXCISION  10/2013   from neck  . EP IMPLANTABLE DEVICE N/A 03/27/2016   Procedure: ICD Implant Dual Chamber;  Surgeon: Marinus Maw, MD;  Location: [facility] INVASIVE CV LAB;  Service: Cardiovascular;  Laterality: N/A;  . ESOPHAGOGASTRODUODENOSCOPY  02/2010  . MITRAL VALVE REPAIR Right 03/19/2016   Procedure: MINIMALLY INVASIVE MITRAL VALVE REPAIR (MVR);  Surgeon: [name], MD;  Location: [facility] OR;  Service: Open Heart Surgery;  Laterality: Right;  . TEE WITHOUT CARDIOVERSION N/A 02/23/2016   Procedure: TRANSESOPHAGEAL ECHOCARDIOGRAM (TEE);  Surgeon: Quintella Reichert, MD;  Location: [facility] ENDOSCOPY;  Service: Cardiovascular: Normal LV size and function.  Degenerative mitral valve disease with failed posterior leaflet (P2 segment), Severe MR with pulmonary vein systolic flow reversal. Moderate-TR.    . TEE WITHOUT CARDIOVERSION N/A 03/19/2016   Procedure: TRANSESOPHAGEAL ECHOCARDIOGRAM (TEE);  Surgeon: [name], MD;  Location: [facility];  Service: Open Heart Surgery;  Laterality: N/A;  . TRANSTHORACIC ECHOCARDIOGRAM  02/14/2016   EF 50-55% with moderate LVH. Possible inferolateral hypokinesis. Moderate-severe MR with posterior leaflet prolapse, cannot rule out flail. Severe LA dilation. Moderate TR.     Family History  Problem Relation Age of Onset  .  Hypertension Mother   . Stroke Mother   . Heart disease Father   . Heart attack Father   . Dementia Sister   . Retinoblastoma Son   . COPD Neg Hx   . Cancer Neg Hx   . Diabetes Neg Hx      Social History   Social History  . Marital status: Married    Spouse name: N/A  . Number of children: N/A  . Years of education: N/A   Occupational History  . Not on file.   Social History Main Topics  . Smoking status: Never Smoker  . Smokeless tobacco: Never Used  . Alcohol use No  . Drug use: No  . Sexual activity: Not on file   Other Topics Concern  . Not on file   Social History Narrative  . No narrative on file     BP 132/88   Pulse 90   Ht 5\' 11"  (1.803 m)   Wt 194 lb 6.4 oz (88.2 kg)   SpO2 98%   BMI 27.11 kg/m   Physical Exam:  Well appearing elderly man, NAD HEENT: Unremarkable Neck:  No JVD, no thyromegally Lymphatics:  No adenopathy Back:  No CVA tenderness Lungs:  Clear HEART:  Regular rate rhythm, no murmurs, no rubs, no clicks Abd:  soft, positive bowel sounds, no organomegally, no rebound, no guarding Ext:  2 plus pulses, no edema, no cyanosis, no clubbing Skin:  No rashes no nodules Neuro:  CN II through XII intact, motor grossly intact  EKG - nsr  DEVICE  Normal device function.  See PaceArt for details.   Assess/Plan: 1. VT - he has had no recurrent symptoms. He will undergo watchful waiting.  2. Chronic systolic heart failure - he appears to be class 1. He will continue his current meds. 3. ICD - his medtronic device is working normally. Will follow.  Leonia Reeves.D.

## 2016-07-31 NOTE — Telephone Encounter (Signed)
Patient saw Dr Ladona Ridgel in [place] today, 07/31/16.

## 2016-07-31 NOTE — Progress Notes (Signed)
Cardiac Individual Treatment Plan  Patient Details  Name: Roy Palmer MRN: [id number] Date of Birth: 1929-09-24 Referring Provider:   Flowsheet Row Cardiac Rehab from 06/24/2016 in [facility] Cardiac and Pulmonary Rehab  Referring Provider  End, Harrell Gave MD      Initial Encounter Date:  Flowsheet Row Cardiac Rehab from 06/24/2016 in [facility] Cardiac and Pulmonary Rehab  Date  06/24/16  Referring Provider  End, Harrell Gave MD      Visit Diagnosis: S/P mitral valve repair  Patient's Home Medications on Admission:  Current Outpatient Prescriptions:  .  aspirin EC 81 MG tablet, Take 1 tablet (81 mg total) by mouth daily., Disp: , Rfl:  .  lisinopril (PRINIVIL,ZESTRIL) 2.5 MG tablet, Take 1 tablet (2.5 mg total) by mouth daily., Disp: 90 tablet, Rfl: 1  Past Medical History: Past Medical History:  Diagnosis Date  . AAA (abdominal aortic aneurysm) without rupture ([place]) 03/12/2016   Small - measured 3.3 x 3.5 cm by CTA  . CKD (chronic kidney disease) stage 3, GFR 30-59 ml/min   . Essential hypertension   . Hyperlipidemia   . Hypertensive kidney disease   . Incidental pulmonary nodule 03/12/2016   Vague opacity right lung base requires radiographic follow up - index of suspicion is LOW  . Near syncope    Cardiogenic, related to an SVT and severe MR  . Non-sustained ventricular tachycardia ([place])   . S/P minimally invasive mitral valve repair 03/19/2016   Complex valvuloplasty including triangular resection of flail segment of posterior leaflet, chordal transposition x1, artificial Gore-tex neochord placement x4 and 34 mm Sorin Memo 3D ring annuloplasty via right mini thoracotomy approach with clipping of LA appendage  . Severe mitral regurgitation by prior echocardiogram 02/23/2016   Confirmed by TEE  . Syncope 03/15/2016  . Thoracic aortic aneurysm ([place]) 03/12/2016   Very very mild fusiform enlargement of distal transverse and proximal descending thoracic aorta noted on CTA  .  Trigeminal neuralgia     Tobacco Use: History  Smoking Status  . Never Smoker  Smokeless Tobacco  . Never Used    Labs: Recent Review Flowsheet Data    Labs for ITP Cardiac and Pulmonary Rehab Latest Ref Rng & Units 03/19/2016 03/19/2016 03/19/2016 03/20/2016 03/22/2016   Cholestrol 0 - 200 mg/dL - - - - -   LDLCALC 0 - 99 mg/dL - - - - -   HDL >40 mg/dL - - - - -   Trlycerides <150 mg/dL - - - - -   Hemoglobin A1c 4.8 - 5.6 % - - - - -   PHART 7.350 - 7.450 7.353 - 7.416 - -   PCO2ART 32.0 - 48.0 mmHg 40.1 - 29.9(L) - -   HCO3 20.0 - 28.0 mmol/L 22.4 - 19.3(L) - -   TCO2 0 - 100 mmol/L '24 24 20 23 24   '$ ACIDBASEDEF 0.0 - 2.0 mmol/L 3.0(H) - 4.0(H) - -   O2SAT % 96.0 - 98.0 - -       Exercise Target Goals:    Exercise Program Goal: Individual exercise prescription set with THRR, safety & activity barriers. Participant demonstrates ability to understand and report RPE using BORG scale, to self-measure pulse accurately, and to acknowledge the importance of the exercise prescription.  Exercise Prescription Goal: Starting with aerobic activity 30 plus minutes a day, 3 days per week for initial exercise prescription. Provide home exercise prescription and guidelines that participant acknowledges understanding prior to discharge.  Activity Barriers & Risk Stratification:  Activity Barriers & Cardiac Risk Stratification - 06/24/16 1226      Activity Barriers & Cardiac Risk Stratification   Activity Barriers None   Cardiac Risk Stratification High      6 Minute Walk:     6 Minute Walk    Row Name 06/24/16 1320         6 Minute Walk   Phase Initial     Distance 860 feet     Walk Time 6 minutes     # of Rest Breaks 0     MPH 1.63     METS 2.23     RPE 9     VO2 Peak 5.76     Symptoms No     Resting HR 98 bpm     Resting BP 140/60     Max Ex. HR 112 bpm     Max Ex. BP 146/64     2 Minute Post BP 134/66        Oxygen Initial Assessment:   Oxygen  Re-Evaluation:   Oxygen Discharge (Final Oxygen Re-Evaluation):   Initial Exercise Prescription:     Initial Exercise Prescription - 06/24/16 1300      Date of Initial Exercise RX and Referring Provider   Date 06/24/16   Referring Provider End, Harrell Gave MD     Treadmill   MPH 0.8   Grade 0   Minutes 15   METs 1.6     Bike   Level 0.3   Minutes 15   METs 2.2     Arm Ergometer   Level 2   Minutes 15   METs 2     Prescription Details   Frequency (times per week) 3   Duration Progress to 45 minutes of aerobic exercise without signs/symptoms of physical distress     Intensity   THRR 40-80% of Max Heartrate 113-128   Ratings of Perceived Exertion 11-13   Perceived Dyspnea 0-4     Progression   Progression Continue to progress workloads to maintain intensity without signs/symptoms of physical distress.     Resistance Training   Training Prescription Yes   Weight 3 lbs   Reps 10-15      Perform Capillary Blood Glucose checks as needed.  Exercise Prescription Changes:     Exercise Prescription Changes    Row Name 06/24/16 1300 07/11/16 1100 07/18/16 1000 07/26/16 0900       Response to Exercise   Blood Pressure (Admit) 140/60 120/68  - 132/70    Blood Pressure (Exercise) 146/64 122/64  -  -    Blood Pressure (Exit) 134/66 132/80  - 128/76    Heart Rate (Admit) 98 bpm 104 bpm  - 90 bpm    Heart Rate (Exercise) 112 bpm 122 bpm  - 104 bpm    Heart Rate (Exit) 93 bpm 91 bpm  - 95 bpm    Oxygen Saturation (Exercise) 99 %  -  -  -    Rating of Perceived Exertion (Exercise) 9 12  - 12    Symptoms none  -  -  -    Duration  - Progress to 45 minutes of aerobic exercise without signs/symptoms of physical distress  - Progress to 45 minutes of aerobic exercise without signs/symptoms of physical distress    Intensity  - THRR unchanged  - THRR unchanged      Progression   Progression  -  -  - Continue to progress workloads to maintain intensity without  signs/symptoms of physical distress.      Resistance Training   Training Prescription  - Yes  - Yes    Weight  - 3  - 3    Reps  - 10-15  - 10-15      Interval Training   Interval Training  - No  - No      Treadmill   MPH  - 0.8  - 0.8    Grade  - 0  - 0    Minutes  - 15  - 15    METs  - 1.6  - 1.6      NuStep   Level  - 2  - 2    Minutes  - 15  - 15    METs  - 2  -  -      Home Exercise Plan   Plans to continue exercise at  -  - Home  -    Frequency  -  - Add 2 additional days to program exercise sessions.  -      Exercise Review   Progression -  walk test results  -  -  -       Exercise Comments:     Exercise Comments    Row Name 07/01/16 1810 07/11/16 1154 07/18/16 1047 07/24/16 1720 07/26/16 4481   Exercise Comments  First full day of exercise!  Patient was oriented to gym and equipment including functions, settings, policies, and procedures.  Patient's individual exercise prescription and treatment plan were reviewed.  All starting workloads were established based on the results of the 6 minute walk test done at initial orientation visit.  The plan for exercise progression was also introduced and progression will be customized based on patient's performance and goals. Roy Palmer has tolerated exercise well in his first weeks. Roy Palmer uses a stationary bike at home.  Safety measures as well as heart rate, RPE etc were reviewed and patient voiced understanding. Roy Palmer noted to have PVC's prior to beginning exercise.  Minimal exercise performed with continuation of PVC's with minimal effort.  No discomfort reported by pt. Roy Palmer has no symptoms with exercise but continues to have PVCs - he has seen his Dr and is following up about this issue.      Exercise Goals and Review:   Exercise Goals Re-Evaluation :   Discharge Exercise Prescription (Final Exercise Prescription Changes):     Exercise Prescription Changes - 07/26/16 0900      Response to Exercise   Blood Pressure  (Admit) 132/70   Blood Pressure (Exit) 128/76   Heart Rate (Admit) 90 bpm   Heart Rate (Exercise) 104 bpm   Heart Rate (Exit) 95 bpm   Rating of Perceived Exertion (Exercise) 12   Duration Progress to 45 minutes of aerobic exercise without signs/symptoms of physical distress   Intensity THRR unchanged     Progression   Progression Continue to progress workloads to maintain intensity without signs/symptoms of physical distress.     Resistance Training   Training Prescription Yes   Weight 3   Reps 10-15     Interval Training   Interval Training No     Treadmill   MPH 0.8   Grade 0   Minutes 15   METs 1.6     NuStep   Level 2   Minutes 15      Nutrition:  Target Goals: Understanding of nutrition guidelines, daily intake of sodium '1500mg'$ , cholesterol '200mg'$ , calories 30% from fat and 7%  or less from saturated fats, daily to have 5 or more servings of fruits and vegetables.  Biometrics:     Pre Biometrics - 06/24/16 1331      Pre Biometrics   Height 5' 9.9" (1.775 m)   Weight 190 lb 1.6 oz (86.2 kg)   Waist Circumference 39 inches   Hip Circumference 42 inches   Waist to Hip Ratio 0.93 %   BMI (Calculated) 27.4   Single Leg Stand 2.65 seconds       Nutrition Therapy Plan and Nutrition Goals:     Nutrition Therapy & Goals - 06/24/16 1224      Nutrition Therapy   Diet --  Roy Palmer said he knows what to eat and declined an individual appt with the [place] [facility]istered Dietician.    Drug/Food Interactions Coumadin/Vit K     Intervention Plan   Intervention Prescribe, educate and counsel regarding individualized specific dietary modifications aiming towards targeted core components such as weight, hypertension, lipid management, diabetes, heart failure and other comorbidities.   Expected Outcomes Short Term Goal: Understand basic principles of dietary content, such as calories, fat, sodium, cholesterol and nutrients.      Nutrition Discharge: Rate Your Plate  Scores:     Nutrition Assessments - 06/24/16 1229      Rate Your Plate Scores   Pre Score --  Roy Palmer did not complete this form.      MEDFICTS Scores   Pre Score --  Roy Palmer did not complete this form.       Nutrition Goals Re-Evaluation:     Nutrition Goals Re-Evaluation    Row Name 07/24/16 Roy Palmer said he knows what to eat and he is doing well. He prefers not to meet individually with the Cardiac REhab [facility] [place].          Personal Goal #1 Re-Evaluation   Goal Progress Seen Yes          Nutrition Goals Discharge (Final Nutrition Goals Re-Evaluation):     Nutrition Goals Re-Evaluation - 07/24/16 1505      Goals   Comment Roy Palmer said he knows what to eat and he is doing well. He prefers not to meet individually with the [facility] [place].      Personal Goal #1 Re-Evaluation   Goal Progress Seen Yes      Psychosocial: Target Goals: Acknowledge presence or absence of significant depression and/or stress, maximize coping skills, provide positive support system. Participant is able to verbalize types and ability to use techniques and skills needed for reducing stress and depression.   Initial Review & Psychosocial Screening:     Initial Psych Review & Screening - 06/24/16 [place]? Yes     Barriers   Psychosocial barriers to participate in program There are no identifiable barriers or psychosocial needs.     Screening Interventions   Interventions Encouraged to exercise      Quality of Life Scores:      Quality of Life - 06/24/16 1230      Quality of Life Scores   Health/Function Pre --  Roy Palmer did not complete this form.       PHQ-9: Recent Review Flowsheet Data    Depression screen [facility] 2/9 06/24/2016   Decreased Interest 0   Down, Depressed, Hopeless 0   PHQ - 2 Score  0   Altered sleeping 0   Tired, decreased energy 0   Change in appetite 0   Feeling  bad or failure about yourself  0   Trouble concentrating 0   Moving slowly or fidgety/restless 0   Suicidal thoughts 0   PHQ-9 Score 0     Interpretation of Total Score  Total Score Depression Severity:  1-4 = Minimal depression, 5-9 = Mild depression, 10-14 = Moderate depression, 15-19 = Moderately severe depression, 20-27 = Severe depression   Psychosocial Evaluation and Intervention:     Psychosocial Evaluation - 07/01/16 1721      Psychosocial Evaluation & Interventions   Interventions Encouraged to exercise with the program and follow exercise prescription   Comments Counselor met with Roy Palmer today for initial psychosocial evaluation.  He is a well-adjusted 83 year old who had mitral valve repair last October.  He has a strong support system with a spouse of 37 years and a son and daughter who live close by, as well as Roy Palmer is actively involved in his local church.  He states he sleeps well and has a good appetite.  He denies a history of depression or anxiety or any current symptoms.  He reports typically being in a positive mood and has minimal stress in his life.  Roy Palmer loves to fish and hunt, so his goals are to increase his stamina and strength to be able to enjoy these aspects of life.  He is encouraged to exercise consistently.  Staff will follow with him over the course of this program.        Psychosocial Re-Evaluation:     Psychosocial Re-Evaluation    [place] Name 07/24/16 1507             Psychosocial Re-Evaluation   Comments Roy Palmer said he goes fishing with his son and gets to spend time with his wife and family. I advised Roy Palmer not to go fishing at [employer] 2 days ago until he spoke with his MD since he had a lot of PVCs which Dr. Darnelle Bos office received by fax on 2/19.        Interventions Encouraged to attend Cardiac Rehabilitation for the exercise          Psychosocial Discharge (Final Psychosocial Re-Evaluation):     Psychosocial Re-Evaluation  - 07/24/16 1507      Psychosocial Re-Evaluation   Comments Roy Palmer said he goes fishing with his son and gets to spend time with his wife and family. I advised Roy Palmer not to go fishing at [employer] 2 days ago until he spoke with his MD since he had a lot of PVCs which Dr. Darnelle Bos office received by fax on 2/19.    Interventions Encouraged to attend Cardiac Rehabilitation for the exercise      Vocational Rehabilitation: Provide vocational rehab assistance to qualifying candidates.   Vocational Rehab Evaluation & Intervention:     Vocational Rehab - 06/24/16 1227      Initial Vocational Rehab Evaluation & Intervention   Assessment shows need for Vocational Rehabilitation No      Education: Education Goals: Education classes will be provided on a weekly basis, covering required topics. Participant will state understanding/return demonstration of topics presented.  Learning Barriers/Preferences:     Learning Barriers/Preferences - 06/24/16 1226      Learning Barriers/Preferences   Learning Barriers None   Learning Preferences None      Education Topics: General Nutrition Guidelines/Fats and Fiber: -Group  instruction provided by verbal, written material, models and posters to present the general guidelines for heart healthy nutrition. Gives an explanation and review of dietary fats and fiber.   Controlling Sodium/Reading Food Labels: -Group verbal and written material supporting the discussion of sodium use in heart healthy nutrition. Review and explanation with models, verbal and written materials for utilization of the food label.   Exercise Physiology & Risk Factors: - Group verbal and written instruction with models to review the exercise physiology of the cardiovascular system and associated critical values. Details cardiovascular disease risk factors and the goals associated with each risk factor.   Aerobic Exercise & Resistance Training: - Gives group verbal and  written discussion on the health impact of inactivity. On the components of aerobic and resistive training programs and the benefits of this training and how to safely progress through these programs.   Flexibility, Balance, General Exercise Guidelines: - Provides group verbal and written instruction on the benefits of flexibility and balance training programs. Provides general exercise guidelines with specific guidelines to those with heart or lung disease. Demonstration and skill practice provided. Flowsheet Row Cardiac Rehab from 07/29/2016 in [facility] Cardiac and Pulmonary Rehab  Date  07/01/16  Educator  [facility]  Instruction Review Code  2- meets goals/outcomes      Stress Management: - Provides group verbal and written instruction about the health risks of elevated stress, cause of high stress, and healthy ways to reduce stress. Flowsheet Row Cardiac Rehab from 07/29/2016 in [facility] Cardiac and Pulmonary Rehab  Date  07/10/16  Educator  [employer]  Instruction Review Code  2- meets goals/outcomes      Depression: - Provides group verbal and written instruction on the correlation between heart/lung disease and depressed mood, treatment options, and the stigmas associated with seeking treatment.   Anatomy & Physiology of the Heart: - Group verbal and written instruction and models provide basic cardiac anatomy and physiology, with the coronary electrical and arterial systems. Review of: AMI, Angina, Valve disease, Heart Failure, Cardiac Arrhythmia, Pacemakers, and the ICD. Flowsheet Row Cardiac Rehab from 07/29/2016 in [facility] Cardiac and Pulmonary Rehab  Date  07/08/16  Educator  CE  Instruction Review Code  2- meets goals/outcomes      Cardiac Procedures: - Group verbal and written instruction and models to describe the testing methods done to diagnose heart disease. Reviews the outcomes of the test results. Describes the treatment choices: Medical Management, Angioplasty, or Coronary Bypass  Surgery. Flowsheet Row Cardiac Rehab from 07/29/2016 in [facility] Cardiac and Pulmonary Rehab  Date  07/15/16  Educator  CE  Instruction Review Code  2- meets goals/outcomes      Cardiac Medications: - Group verbal and written instruction to review commonly prescribed medications for heart disease. Reviews the medication, class of the drug, and side effects. Includes the steps to properly store meds and maintain the prescription regimen. Flowsheet Row Cardiac Rehab from 07/29/2016 in [facility] Cardiac and Pulmonary Rehab  Date  07/24/16  Educator  CE  Instruction Review Code  2- meets goals/outcomes      Go Sex-Intimacy & Heart Disease, Get SMART - Goal Setting: - Group verbal and written instruction through game format to discuss heart disease and the return to sexual intimacy. Provides group verbal and written material to discuss and apply goal setting through the application of the S.M.A.R.T. Method. Flowsheet Row Cardiac Rehab from 07/29/2016 in [facility] Cardiac and Pulmonary Rehab  Date  07/15/16  Educator  CE  Instruction Review Code  2- meets goals/outcomes      Other Matters of the Heart: - Provides group verbal, written materials and models to describe Heart Failure, Angina, Valve Disease, and Diabetes in the realm of heart disease. Includes description of the disease process and treatment options available to the cardiac patient. Flowsheet Row Cardiac Rehab from 07/29/2016 in [facility] Cardiac and Pulmonary Rehab  Date  07/08/16  Educator  CE  Instruction Review Code  2- meets goals/outcomes      Exercise & Equipment Safety: - Individual verbal instruction and demonstration of equipment use and safety with use of the equipment. Flowsheet Row Cardiac Rehab from 07/29/2016 in [facility] Cardiac and Pulmonary Rehab  Date  06/24/16  Educator  C. EnterkinRN  Instruction Review Code  1- partially meets, needs review/practice      Infection Prevention: - Provides verbal and written material to  individual with discussion of infection control including proper hand washing and proper equipment cleaning during exercise session. Flowsheet Row Cardiac Rehab from 07/29/2016 in [facility] Cardiac and Pulmonary Rehab  Date  06/24/16  Educator  C. EnterkinRN  Instruction Review Code  2- meets goals/outcomes      Falls Prevention: - Provides verbal and written material to individual with discussion of falls prevention and safety. Flowsheet Row Cardiac Rehab from 07/29/2016 in [facility] Cardiac and Pulmonary Rehab  Date  06/24/16  Educator  C. [place]  Instruction Review Code  2- meets goals/outcomes      Diabetes: - Individual verbal and written instruction to review signs/symptoms of diabetes, desired ranges of glucose level fasting, after meals and with exercise. Advice that pre and post exercise glucose checks will be done for 3 sessions at entry of program.    Knowledge Questionnaire Score:     Knowledge Questionnaire Score - 06/24/16 1227      Knowledge Questionnaire Score   Pre Score --  Roy Palmer declined to do this questionnaire .      Core Components/Risk Factors/Patient Goals at Admission:     Personal Goals and Risk Factors at Admission - 06/24/16 1229      Core Components/Risk Factors/Patient Goals on Admission    Weight Management Yes;Weight Loss   Intervention Weight Management: Develop a combined nutrition and exercise program designed to reach desired caloric intake, while maintaining appropriate intake of nutrient and fiber, sodium and fats, and appropriate energy expenditure required for the weight goal.;Weight Management: Provide education and appropriate resources to help participant work on and attain dietary goals.   Admit Weight 190 lb 1.6 oz (86.2 kg)   Goal Weight: Short Term 185 lb (83.9 kg)   Goal Weight: Long Term 180 lb (81.6 kg)   Expected Outcomes Short Term: Continue to assess and modify interventions until short term weight is achieved;Long Term:  Adherence to nutrition and physical activity/exercise program aimed toward attainment of established weight goal;Weight Loss: Understanding of general recommendations for a balanced deficit meal plan, which promotes 1-2 lb weight loss per week and includes a negative energy balance of [phone] kcal/d;Understanding recommendations for meals to include 15-35% energy as protein, 25-35% energy from fat, 35-60% energy from carbohydrates, less than '200mg'$  of dietary cholesterol, 20-35 gm of total fiber daily;Understanding of distribution of calorie intake throughout the day with the consumption of 4-5 meals/snacks   Sedentary Yes   Intervention Provide advice, education, support and counseling about physical activity/exercise needs.;Develop an individualized exercise prescription for aerobic and resistive training based on initial evaluation findings, risk stratification, comorbidities and participant's personal goals.  Expected Outcomes Achievement of increased cardiorespiratory fitness and enhanced flexibility, muscular endurance and strength shown through measurements of functional capacity and personal statement of participant.   Increase Strength and Stamina Yes   Intervention Provide advice, education, support and counseling about physical activity/exercise needs.;Develop an individualized exercise prescription for aerobic and resistive training based on initial evaluation findings, risk stratification, comorbidities and participant's personal goals.   Expected Outcomes Achievement of increased cardiorespiratory fitness and enhanced flexibility, muscular endurance and strength shown through measurements of functional capacity and personal statement of participant.   Hypertension Yes   Intervention Provide education on lifestyle modifcations including regular physical activity/exercise, weight management, moderate sodium restriction and increased consumption of fresh fruit, vegetables, and low fat dairy,  alcohol moderation, and smoking cessation.;Monitor prescription use compliance.   Expected Outcomes Short Term: Continued assessment and intervention until BP is < 140/56m HG in hypertensive participants. < 130/819mHG in hypertensive participants with diabetes, heart failure or chronic kidney disease.;Long Term: Maintenance of blood pressure at goal levels.   Lipids Yes   Intervention Provide education and support for participant on nutrition & aerobic/resistive exercise along with prescribed medications to achieve LDL '70mg'$ , HDL >'40mg'$ .   Expected Outcomes Short Term: Participant states understanding of desired cholesterol values and is compliant with medications prescribed. Participant is following exercise prescription and nutrition guidelines.;Long Term: Cholesterol controlled with medications as prescribed, with individualized exercise RX and with personalized nutrition plan. Value goals: LDL < '70mg'$ , HDL > 40 mg.      Core Components/Risk Factors/Patient Goals Review:      Goals and Risk Factor Review    Row Name 07/18/16 1640 07/24/16 1409 07/24/16 1503         Core Components/Risk Factors/Patient Goals Review   Personal Goals Review Increase Strength and Stamina;Hypertension  - Weight Management/Obesity;Increase Strength and Stamina;Hypertension;Lipids     Review Roy Palmer BP has been within normal range while in HT.  He can tell a difference in ADLs since beginning exercise - they are easier.  His wife monitors what they eat - he states she has high cholesterol and "is up on all that".  He does not have other concerns at this time.   Multiple PVCS this is the note in EPIC/CHL copied; [facility]unders Revel [facility] readings reviewed by Dr. EnSaunders RevelI left a vm to check on JoSerita GritHe had frequent PVC's but had not c/o during his last Cardiac Rehab visit. Nevan has increased his strength and states he feels better. His blood  pressure is stable. Roy Palmer said he knows what to eat and knows exercise will help his lipid panel.      Expected Outcomes  -  - Heart healthy lifestyle.        Core Components/Risk Factors/Patient Goals at Discharge (Final Review):      Goals and Risk Factor Review - 07/24/16 1503      Core Components/Risk Factors/Patient Goals Review   Personal Goals Review Weight Management/Obesity;Increase Strength and Stamina;Hypertension;Lipids   Review I left a vm to check on JoWinn-DixieHe had frequent PVC's but had not c/o during his last Cardiac Rehab visit. Roy Palmer has increased his strength and states he feels better. His blood pressure is stable. Zavon said he knows what to eat and knows exercise will help his lipid panel.    Expected Outcomes Heart healthy lifestyle.      ITP Comments:     ITP Comments  [place] Name 06/24/16 1225 06/24/16 1232 07/03/16 0648 07/22/16 1811 07/22/16 1814   ITP Comments Maciej said he knows what to eat and declined an individual appt with the Cardiac Rehab REgistered [place]. ITP created during Medical review today. Documention of Dx Discharge Summary 03/29/2016 [facility] 30 day review. Continue with ITP unless directed changes per Medical Director review.  New to program Multiple PVc's seen today, some couplets. No symptoms reported. Will forward EKG strips to cardiologist. Roy Palmer has been advised to call physycian for follow up.    Raffi was given some OJ and a banana to eat this afternoon   Row Name 07/24/16 1502 07/31/16 0634         ITP Comments I left a vm to check on [employer]. He had frequent PVC's but had not c/o during his last Cardiac Rehab visit. I actually stopped him from exercising and only allowed him to do some brief stretching seated at the end of Cardiac Rehab.  30 day review. Continue with ITP unless directed changes per Medical Director review.         Comments:

## 2016-07-31 NOTE — Patient Instructions (Addendum)
Medication Instructions:  Your physician recommends that you continue on your current medications as directed. Please refer to the Current Medication list given to you today.   Labwork: None Ordered   Testing/Procedures: None Ordered   Follow-Up: Your physician wants you to follow-up in: 7 months with Dr. Ladona Ridgel. You will receive a reminder letter in the mail two months in advance. If you don't receive a letter, please call our office to schedule the follow-up appointment.  Remote monitoring is used to monitor your ICD from home. This monitoring reduces the number of office visits required to check your device to one time per year. It allows [place] to keep an eye on the functioning of your device to ensure it is working properly. You are scheduled for a device check from home on 10/30/16. You may send your transmission at any time that day. If you have a wireless device, the transmission will be sent automatically. After your physician reviews your transmission, you will receive a postcard with your next transmission date.    Any Other Special Instructions Will Be Listed Below (If Applicable).     If you need a refill on your cardiac medications before your next appointment, please call your pharmacy.

## 2016-07-31 NOTE — Progress Notes (Signed)
Daily Session Note  Patient Details  Name: Roy Palmer MRN: [id number] Date of Birth: 1929-09-29 Referring Provider:   Flowsheet Row Cardiac Rehab from 06/24/2016 in [facility] Cardiac and Pul[facility] Rehab  Referring Provider  End, Harrell Gave MD      Encounter Date: 07/31/2016  Check In:     Session Check In - 07/31/16 1703      Check-In   Location [facility]-Cardiac & Pulmonary Rehab   Staff Present Levell July RN BSN;Carroll Enterkin, RN, Vickki Hearing, BA, ACSM CEP, Exercise Physiologist   Supervising physician immediately available to respond to emergencies See telemetry face sheet for immediately available ER MD   Medication changes reported     No   Fall or balance concerns reported    No   Tobacco Cessation No Change   Warm-up and Cool-down Performed on first and last piece of equipment   Resistance Training Performed Yes   VAD Patient? No     Pain Assessment   Currently in Pain? No/denies         History  Smoking Status  . Never Smoker  Smokeless Tobacco  . Never Used    Goals Met:  Independence with exercise equipment Exercise tolerated well No report of cardiac concerns or symptoms Strength training completed today  Goals Unmet:  Not Applicable  Comments: Pt able to follow exercise prescription today without complaint.  Will continue to monitor for progression.    Dr. Emily Filbert is Medical Director for [place] and [facility].

## 2016-08-01 ENCOUNTER — Encounter: Payer: Medicare Other | Attending: Thoracic Surgery (Cardiothoracic Vascular Surgery)

## 2016-08-01 ENCOUNTER — Telehealth: Payer: Self-pay | Admitting: Unknown Physician Specialty

## 2016-08-01 DIAGNOSIS — N183 Chronic kidney disease, stage 3 (moderate): Secondary | ICD-10-CM | POA: Insufficient documentation

## 2016-08-01 DIAGNOSIS — Z7982 Long term (current) use of aspirin: Secondary | ICD-10-CM | POA: Insufficient documentation

## 2016-08-01 DIAGNOSIS — Z9889 Other specified postprocedural states: Secondary | ICD-10-CM

## 2016-08-01 DIAGNOSIS — I129 Hypertensive chronic kidney disease with stage 1 through stage 4 chronic kidney disease, or unspecified chronic kidney disease: Secondary | ICD-10-CM | POA: Diagnosis not present

## 2016-08-01 DIAGNOSIS — Z48812 Encounter for surgical aftercare following surgery on the circulatory system: Secondary | ICD-10-CM | POA: Insufficient documentation

## 2016-08-01 DIAGNOSIS — E785 Hyperlipidemia, unspecified: Secondary | ICD-10-CM | POA: Insufficient documentation

## 2016-08-01 DIAGNOSIS — Z79899 Other long term (current) drug therapy: Secondary | ICD-10-CM | POA: Insufficient documentation

## 2016-08-01 DIAGNOSIS — I712 Thoracic aortic aneurysm, without rupture: Secondary | ICD-10-CM | POA: Diagnosis not present

## 2016-08-01 MED ORDER — FUROSEMIDE 20 MG PO TABS: 20.0000 mg | ORAL_TABLET | Freq: Every day | ORAL | 0 refills | Status: DC

## 2016-08-01 NOTE — Progress Notes (Signed)
Daily Session Note  Patient Details  Name: Roy Palmer MRN: [id number] Date of Birth: Dec 25, 1929 Referring Provider:   Flowsheet Row Cardiac Rehab from 06/24/2016 in [facility] Cardiac and Pulmonary Rehab  Referring Provider  End, Harrell Gave MD      Encounter Date: 08/01/2016  Check In:     Session Check In - 08/01/16 1736      Check-In   Location [facility]-Cardiac & Pulmonary Rehab   Staff Present Levell July RN Moises Blood, BS, ACSM CEP, Exercise Physiologist;Aishani Kalis Oletta Darter, [place], ACSM CEP, Exercise Physiologist   Supervising physician immediately available to respond to emergencies See telemetry face sheet for immediately available ER MD   Medication changes reported     No   Fall or balance concerns reported    No   Tobacco Cessation No Change   Warm-up and Cool-down Performed on first and last piece of equipment   Resistance Training Performed Yes   VAD Patient? No     Pain Assessment   Currently in Pain? No/denies         History  Smoking Status  . Never Smoker  Smokeless Tobacco  . Never Used    Goals Met:  Independence with exercise equipment Exercise tolerated well No report of cardiac concerns or symptoms Strength training completed today  Goals Unmet:  Not Applicable  Comments: Pt able to follow exercise prescription today without complaint.  Will continue to monitor for progression.    Dr. Emily Filbert is Medical Director for [place] and [facility].

## 2016-08-01 NOTE — Telephone Encounter (Signed)
Discussed with pt about CHF on chest x-ray.  Will rx Lasix daily for 10 days.  Pt eats diet high in potassium

## 2016-08-01 NOTE — Telephone Encounter (Signed)
Routing to provider. Roy Palmer did you call this patient?

## 2016-08-01 NOTE — Progress Notes (Signed)
Patient notified of results by phone.  Take Lasix daily for 10 days

## 2016-08-05 DIAGNOSIS — Z48812 Encounter for surgical aftercare following surgery on the circulatory system: Secondary | ICD-10-CM | POA: Diagnosis not present

## 2016-08-05 DIAGNOSIS — Z9889 Other specified postprocedural states: Secondary | ICD-10-CM

## 2016-08-05 NOTE — Progress Notes (Signed)
Daily Session Note  Patient Details  Name: Roy Palmer MRN: [id number] Date of Birth: 07/02/1929 Referring Provider:   Flowsheet Row Cardiac Rehab from 06/24/2016 in [facility] Cardiac and Pulmonary Rehab  Referring Provider  End, Harrell Gave MD      Encounter Date: 08/05/2016  Check In:     Session Check In - 08/05/16 1817      Check-In   Warm-up and Cool-down Performed on first and last piece of equipment   Resistance Training Performed Yes   VAD Patient? No     Pain Assessment   Currently in Pain? No/denies   Multiple Pain Sites No         History  Smoking Status  . Never Smoker  Smokeless Tobacco  . Never Used    Goals Met:  Independence with exercise equipment  Goals Unmet:  Not Applicable  Comments: Patient completed exercise prescription and all exercise goals during rehab session. The exercise was tolerated well and the patient is progressing in the program.    Dr. Emily Filbert is Medical Director for [place] and [facility].

## 2016-08-07 ENCOUNTER — Ambulatory Visit (INDEPENDENT_AMBULATORY_CARE_PROVIDER_SITE_OTHER): Payer: Medicare Other | Admitting: Internal Medicine

## 2016-08-07 ENCOUNTER — Encounter: Payer: Self-pay | Admitting: Internal Medicine

## 2016-08-07 VITALS — BP 138/76 | HR 87 | Ht 71.0 in | Wt 195.5 lb

## 2016-08-07 DIAGNOSIS — R0601 Orthopnea: Secondary | ICD-10-CM | POA: Diagnosis not present

## 2016-08-07 DIAGNOSIS — Z9889 Other specified postprocedural states: Secondary | ICD-10-CM

## 2016-08-07 DIAGNOSIS — I340 Nonrheumatic mitral (valve) insufficiency: Secondary | ICD-10-CM

## 2016-08-07 DIAGNOSIS — I10 Essential (primary) hypertension: Secondary | ICD-10-CM | POA: Diagnosis not present

## 2016-08-07 DIAGNOSIS — I472 Ventricular tachycardia, unspecified: Secondary | ICD-10-CM

## 2016-08-07 DIAGNOSIS — I428 Other cardiomyopathies: Secondary | ICD-10-CM

## 2016-08-07 DIAGNOSIS — Z48812 Encounter for surgical aftercare following surgery on the circulatory system: Secondary | ICD-10-CM | POA: Diagnosis not present

## 2016-08-07 NOTE — Progress Notes (Signed)
Follow-up Outpatient Visit Date: 08/07/2016  Chief Complaint: Follow-up mitral regurgitation with moderately reduced LVEF and recurrent ventricular tachycardia  HPI:  Roy Palmer is a 83 y.o. year-old male with history of severe mitral regurgitation status post mitral valve repair (03/2016), ventricular tachycardia status post ICD (03/2016), hypertension, hyperlipidemia, and chronic kidney disease, who presents for follow-up. Roy Palmer reports feeling well. He experienced brief episode of orthopnea within the last 2 weeks, prompting evaluation by his PCP. Chest radiograph showed findings suggestive of mild CHF, for which the patient was prescribed furosemide. He took a single dose with resolution of the orthopnea. He did not wish to continue the medication, as he has experienced "jitteriness" in the past with furosemide. He has been active without any limitations. He is participating in cardiac rehabilitation without difficulty. He was noted to have several PVCs last month at cardiac rehabilitation, prompting BMP and magnesium levels as well as follow-up with Dr. Ladona Ridgel. His electrolytes were within normal limits and evaluation of his ICD was unremarkable. At this time, the patient is taking only low-dose aspirin.  Roy Palmer reports that his blood pressure overall has been well-controlled. He denies edema, palpitations, lightheadedness, and chest pain.  --------------------------------------------------------------------------------------------------  Cardiovascular History & Procedures: Cardiovascular Problems:  Severe mitral regurgitation status post repair (03/2016)  Frequent ventricular ectopy with sustained ventricular tachycardia status post ICD  Nonobstructive coronary artery disease  Risk Factors:  Age, male gender, hypertension, and hyperlipidemia  Cath/PCI:  LHC/RHC (02/23/16): Mild, nonobstructive CAD (30% ostial LMCA, 30% ostial LAD, 30% ostial ramus, and minimal luminal  irregularities of dominant LCx. Normal right heart filling pressures. Decreased Fick cardiac output (CO 3.6 L/m, CI 1.8 L/m/m) in setting of frequent PVCs and severe MR.  CV Surgery:  Minimally invasive mitral valve repair (03/19/16, Dr. Cornelius Moras)  EP Procedures and Devices:  24-hour Holter monitor (02/27/16): Predominantly sinus rhythm with average heart rate is 63 bpm (range 49-91 bpm). Longest RR interval 1.6 seconds. Frequent PVCs noted (11% burden) including frequent couplets and bigeminal cycles. 90 runs of NSVT were noted lasting up to 9 beats the maximum rate of 134 bpm. Rare SVT and supraventricular ectopy noted.  Dual chamber ICD (03/27/16, [employer])  Non-Invasive Evaluation(s):  Limited TTE (03/22/16): Normal LV size with moderate LVH and LVEF of 40-45% with possible inferior hypokinesis and incoordinate septal motion. Mitral angioplasty ring in place with trivial MR. No significant mitral stenosis. Mild left atrial enlargement. Normal RV was moderately reduced systolic function. RVSP 41 mm or mercury. Elevated central venous pressure.  TTE (02/14/16): Normal LV size with moderate LVH. EF mildly reduced at 50% with possible inferolateral hypokinesis. Moderate to severe MR noted with prolapse/flail of the posterior leaflet. Severe left atrial enlargement. Moderate TR.  TEE (02/23/16): Normal LV size and wall thickness with lower normal LVEF. Trivial AI. Severe, eccentric MR with reversal of pulmonary vein flow and flail posterior leaflet. No left atrial thrombus. Lipomatous hypertrophy of the septum. Mild to moderate TR.  Limited TTE (03/22/16): Normal LV size with moderate LVH. LVEF 40-45% with possible inferior hypokinesis. Trivial MR with mild left atrial enlargement. Normal RV size with moderately reduced systolic function. Mild TR. No pericardial effusion.  Recent CV Pertinent Labs: Lab Results  Component Value Date   CHOL 182 03/13/2016   CHOL 188 02/14/2016   HDL 33 (L)  03/13/2016   HDL 36 (L) 02/14/2016   LDLCALC 116 (H) 03/13/2016   LDLCALC 127 (H) 02/14/2016   TRIG 163 (H) 03/13/2016   CHOLHDL  5.5 03/13/2016   INR 3.8 06/05/2016   INR 2.45 03/29/2016   BNP 110.8 (H) 03/12/2016   K 4.7 07/30/2016   MG 2.5 (H) 07/24/2016   BUN 21 07/30/2016   CREATININE 1.45 (H) 07/30/2016   Past medical and surgical history were reviewed and updated in EPIC.  Outpatient Encounter Prescriptions as of 08/07/2016  Medication Sig  . aspirin EC 81 MG tablet Take 1 tablet (81 mg total) by mouth daily.  . [DISCONTINUED] furosemide (LASIX) 20 MG tablet Take 1 tablet (20 mg total) by mouth daily. (Patient not taking: Reported on 08/07/2016)  . [DISCONTINUED] lisinopril (PRINIVIL,ZESTRIL) 2.5 MG tablet Take 1 tablet (2.5 mg total) by mouth daily. (Patient not taking: Reported on 08/07/2016)   No facility-administered encounter medications on file as of 08/07/2016.     Allergies: Amiodarone and Lyrica [pregabalin]  Social History   Social History  . Marital status: Married    Spouse name: N/A  . Number of children: N/A  . Years of education: N/A   Occupational History  . Not on file.   Social History Main Topics  . Smoking status: Never Smoker  . Smokeless tobacco: Never Used  . Alcohol use No  . Drug use: No  . Sexual activity: Not on file   Other Topics Concern  . Not on file   Social History Narrative  . No narrative on file    Family History  Problem Relation Age of Onset  . Hypertension Mother   . Stroke Mother   . Heart disease Father   . Heart attack Father   . Dementia Sister   . Retinoblastoma Son   . COPD Neg Hx   . Cancer Neg Hx   . Diabetes Neg Hx     Review of Systems: A 12-system review of systems was performed and was negative except as noted in the HPI.  --------------------------------------------------------------------------------------------------  Physical Exam: BP 138/76 (BP Location: Left Arm, Patient Position: Sitting,  Cuff Size: Normal)   Pulse 87   Ht 5\' 11"  (1.803 m)   Wt 195 lb 8 oz (88.7 kg)   BMI 27.27 kg/m   General:  Well-developed, well-nourished elderly man seated comfortably in the exam room. He is accompanied by his son. HEENT: No conjunctival pallor or scleral icterus.  Moist mucous membranes.  OP clear. Neck: Supple without lymphadenopathy, thyromegaly, JVD, or HJR. Lungs: Normal work of breathing.  Clear to auscultation bilaterally without wheezes or crackles. Heart: Regular rate and rhythm with 1/6 systolic murmur loudest at the left lower sternal border.  Non-displaced PMI. Abd: Bowel sounds present.  Soft, NT/ND without hepatosplenomegaly Ext: No lower extremity edema.  Radial, PT, and DP pulses are 2+ bilaterally. Skin: warm and dry without rash.  EKG:  Normal sinus rhythm with first-degree AV block, right bundle branch block with left axis deviation, and single PVC. No significant change from prior tracing on 07/31/16 (I personally reviewed both tracings).  Lab Results  Component Value Date   WBC 7.8 07/30/2016   HGB 12.1 (L) 03/27/2016   HCT 40.5 07/30/2016   MCV 89 07/30/2016   PLT 188 07/30/2016    Lab Results  Component Value Date   NA 142 07/30/2016   K 4.7 07/30/2016   CL 104 07/30/2016   CO2 24 07/30/2016   BUN 21 07/30/2016   CREATININE 1.45 (H) 07/30/2016   GLUCOSE 115 (H) 07/30/2016   ALT 15 07/30/2016    Lab Results  Component Value Date   CHOL  182 03/13/2016   HDL 33 (L) 03/13/2016   LDLCALC 116 (H) 03/13/2016   TRIG 163 (H) 03/13/2016   CHOLHDL 5.5 03/13/2016    --------------------------------------------------------------------------------------------------  ASSESSMENT AND PLAN: Severe mitral regurgitation status post mitral valve repair  Overall, Roy Palmer is been doing well. He had one episode of orthopnea last month that resolved with a single dose of furosemide. He appears euvolemic on exam. His cardiac exam is unremarkable with very faint  systolic murmur. We have agreed to obtain a transthoracic echocardiogram to reevaluate his mitral valve as well as his LVEF, which was moderately reduced following surgery in 03/2016. No interventions at this time.  Ventricular tachycardia No lightheadedness or palpitations. Recent ICD interrogation was unrevealing. We will defer further management to Dr. Ladona Ridgel.   Hypertension Blood pressure is normal today.No further intervention at this time pending repeat echocardiogram.   Nonischemic cardiomyopathy and orthopnea The patient appears euvolemic and well compensated with NYHA class I-II symptoms. Given his recent episode of orthopnea, we have agreed to repeat an echocardiogram to reevaluate his LV function. It was 40-45% shortly after mitral valve intervention in 03/2016. He was on data blockade and lisinopril following surgery, but did not tolerate these due to hypotension and other side effects. Now that his blood pressure has normalized, it would be reasonable to consider adding back and ACE inhibitor or ARB if his LVEF remains reduced. We will discuss this further after completion of the echo.  Follow-up: Return to clinic in 3 months.  [name], MD 08/07/2016 2:33 PM

## 2016-08-07 NOTE — Progress Notes (Signed)
Daily Session Note  Patient Details  Name: Roy Palmer MRN: [id number] Date of Birth: 1929-10-25 Referring Provider:   Flowsheet Row Cardiac Rehab from 06/24/2016 in [facility] Cardiac and Pulmonary Rehab  Referring Provider  End, Harrell Gave MD      Encounter Date: 08/07/2016  Check In:     Session Check In - 08/07/16 1640      Check-In   Location [facility]-Cardiac & Pulmonary Rehab   Staff Present Levell July RN BSN;Rebecca Sickles, DPT, Burlene Arnt, BA, ACSM CEP, Exercise Physiologist   Supervising physician immediately available to respond to emergencies See telemetry face sheet for immediately available ER MD   Medication changes reported     No   Fall or balance concerns reported    No   Tobacco Cessation No Change   Warm-up and Cool-down Performed on first and last piece of equipment   Resistance Training Performed Yes   VAD Patient? No     Pain Assessment   Currently in Pain? No/denies   Multiple Pain Sites No         History  Smoking Status  . Never Smoker  Smokeless Tobacco  . Never Used    Goals Met:  Independence with exercise equipment Exercise tolerated well No report of cardiac concerns or symptoms Strength training completed today  Goals Unmet:  Not Applicable  Comments: Pt able to follow exercise prescription today without complaint.  Will continue to monitor for progression.    Dr. Emily Filbert is Medical Director for [place] and [facility].

## 2016-08-07 NOTE — Patient Instructions (Signed)
Medication Instructions:  Your physician recommends that you continue on your current medications as directed. Please refer to the Current Medication list given to you today.   Labwork: none  Testing/Procedures: Your physician has requested that you have an echocardiogram. Echocardiography is a painless test that uses sound waves to create images of your heart. It provides your doctor with information about the size and shape of your heart and how well your heart's chambers and valves are working. This procedure takes approximately one hour. There are no restrictions for this procedure.    Follow-Up: Your physician recommends that you schedule a follow-up appointment in: 3 MONTHS WITH DR END.   If you need a refill on your cardiac medications before your next appointment, please call your pharmacy.   Echocardiogram An echocardiogram, or echocardiography, uses sound waves (ultrasound) to produce an image of your heart. The echocardiogram is simple, painless, obtained within a short period of time, and offers valuable information to your health care provider. The images from an echocardiogram can provide information such as:  Evidence of coronary artery disease (CAD).  Heart size.  Heart muscle function.  Heart valve function.  Aneurysm detection.  Evidence of a past heart attack.  Fluid buildup around the heart.  Heart muscle thickening.  Assess heart valve function.  Tell a health care provider about:  Any allergies you have.  All medicines you are taking, including vitamins, herbs, eye drops, creams, and over-the-counter medicines.  Any problems you or family members have had with anesthetic medicines.  Any blood disorders you have.  Any surgeries you have had.  Any medical conditions you have.  Whether you are pregnant or may be pregnant. What happens before the procedure? No special preparation is needed. Eat and drink normally. What happens during the  procedure?  In order to produce an image of your heart, gel will be applied to your chest and a wand-like tool (transducer) will be moved over your chest. The gel will help transmit the sound waves from the transducer. The sound waves will harmlessly bounce off your heart to allow the heart images to be captured in real-time motion. These images will then be recorded.  You may need an IV to receive a medicine that improves the quality of the pictures. What happens after the procedure? You may return to your normal schedule including diet, activities, and medicines, unless your health care provider tells you otherwise. This information is not intended to replace advice given to you by your health care provider. Make sure you discuss any questions you have with your health care provider. Document Released: 05/17/2000 Document Revised: 01/06/2016 Document Reviewed: 01/25/2013 Elsevier Interactive Patient Education  2017 [employer]    

## 2016-08-08 ENCOUNTER — Encounter: Payer: Self-pay | Admitting: Internal Medicine

## 2016-08-08 DIAGNOSIS — Z48812 Encounter for surgical aftercare following surgery on the circulatory system: Secondary | ICD-10-CM | POA: Diagnosis not present

## 2016-08-08 DIAGNOSIS — Z9889 Other specified postprocedural states: Secondary | ICD-10-CM

## 2016-08-08 NOTE — Progress Notes (Signed)
Daily Session Note  Patient Details  Name: Roy Palmer MRN: [id number] Date of Birth: Oct 13, 1929 Referring Provider:   Flowsheet Row Cardiac Rehab from 06/24/2016 in [facility]  Referring Provider  End, Harrell Gave MD      Encounter Date: 08/08/2016  Check In:     Session Check In - 08/08/16 1642      Check-In   Location [facility]   Staff Present Levell July RN BSN;Elester Apodaca, DPT, Burlene Arnt, BA, ACSM CEP, Exercise Physiologist   Supervising physician immediately available to respond to emergencies See telemetry face sheet for immediately available ER MD   Medication changes reported     No   Fall or balance concerns reported    No   Tobacco Cessation No Change   Warm-up and Cool-down Performed on first and last piece of equipment   Resistance Training Performed Yes   VAD Patient? No     Pain Assessment   Currently in Pain? No/denies   Multiple Pain Sites No           Exercise Prescription Changes - 08/08/16 1200      Response to Exercise   Blood Pressure (Admit) 118/68   Blood Pressure (Exercise) 128/76   Blood Pressure (Exit) 112/64   Heart Rate (Admit) 91 bpm   Heart Rate (Exercise) 107 bpm   Heart Rate (Exit) 88 bpm   Rating of Perceived Exertion (Exercise) 13   Duration Progress to 45 minutes of aerobic exercise without signs/symptoms of physical distress   Intensity THRR unchanged     Progression   Progression Continue to progress workloads to maintain intensity without signs/symptoms of physical distress.     Resistance Training   Training Prescription Yes   Weight 3   Reps 10-15     Interval Training   Interval Training No     NuStep   Level 4   Minutes 15   METs 2.5     Arm Ergometer   Level 2   Minutes 15   METs 2.3      History  Smoking Status  . Never Smoker  Smokeless Tobacco  . Never Used    Goals Met:  Independence with exercise equipment  Goals Unmet:  Not  Applicable  Comments: Patient completed exercise prescription and all exercise goals during rehab session. The exercise was tolerated well and the patient is progressing in the program.    Dr. Emily Filbert is Medical Director for [facility].

## 2016-08-14 ENCOUNTER — Encounter: Payer: Medicare Other | Admitting: *Deleted

## 2016-08-14 DIAGNOSIS — Z9889 Other specified postprocedural states: Secondary | ICD-10-CM

## 2016-08-14 DIAGNOSIS — Z48812 Encounter for surgical aftercare following surgery on the circulatory system: Secondary | ICD-10-CM | POA: Diagnosis not present

## 2016-08-14 NOTE — Progress Notes (Signed)
Cardiac Individual Treatment Plan  Patient Details  Name: Roy Palmer MRN: [id number] Date of Birth: 1929-07-07 Referring Provider:   Flowsheet Row Cardiac Rehab from 06/24/2016 in [facility] Cardiac and Pulmonary Rehab  Referring Provider  End, Harrell Gave MD      Initial Encounter Date:  Flowsheet Row Cardiac Rehab from 06/24/2016 in [facility] Cardiac and Pulmonary Rehab  Date  06/24/16  Referring Provider  End, Harrell Gave MD      Visit Diagnosis: S/P mitral valve repair  Patient's Home Medications on Admission:  Current Outpatient Prescriptions:  .  aspirin EC 81 MG tablet, Take 1 tablet (81 mg total) by mouth daily., Disp: , Rfl:   Past Medical History: Past Medical History:  Diagnosis Date  . AAA (abdominal aortic aneurysm) without rupture ([place]) 03/12/2016   Small - measured 3.3 x 3.5 cm by CTA  . CKD (chronic kidney disease) stage 3, GFR 30-59 ml/min   . Essential hypertension   . Hyperlipidemia   . Hypertensive kidney disease   . Incidental pulmonary nodule 03/12/2016   Vague opacity right lung base requires radiographic follow up - index of suspicion is LOW  . Near syncope    Cardiogenic, related to an SVT and severe MR  . Non-sustained ventricular tachycardia ([place])   . S/P minimally invasive mitral valve repair 03/19/2016   Complex valvuloplasty including triangular resection of flail segment of posterior leaflet, chordal transposition x1, artificial Gore-tex neochord placement x4 and 34 mm Sorin Memo 3D ring annuloplasty via right mini thoracotomy approach with clipping of LA appendage  . Severe mitral regurgitation by prior echocardiogram 02/23/2016   Confirmed by TEE  . Syncope 03/15/2016  . Thoracic aortic aneurysm ([place]) 03/12/2016   Very very mild fusiform enlargement of distal transverse and proximal descending thoracic aorta noted on CTA  . Trigeminal neuralgia     Tobacco Use: History  Smoking Status  . Never Smoker  Smokeless Tobacco  . Never Used     Labs: Recent Review Flowsheet Data    Labs for ITP Cardiac and Pulmonary Rehab Latest Ref Rng & Units 03/19/2016 03/19/2016 03/19/2016 03/20/2016 03/22/2016   Cholestrol 0 - 200 mg/dL - - - - -   LDLCALC 0 - 99 mg/dL - - - - -   HDL >40 mg/dL - - - - -   Trlycerides <150 mg/dL - - - - -   Hemoglobin A1c 4.8 - 5.6 % - - - - -   PHART 7.350 - 7.450 7.353 - 7.416 - -   PCO2ART 32.0 - 48.0 mmHg 40.1 - 29.9(L) - -   HCO3 20.0 - 28.0 mmol/L 22.4 - 19.3(L) - -   TCO2 0 - 100 mmol/L '24 24 20 23 24   '$ ACIDBASEDEF 0.0 - 2.0 mmol/L 3.0(H) - 4.0(H) - -   O2SAT % 96.0 - 98.0 - -       Exercise Target Goals:    Exercise Program Goal: Individual exercise prescription set with THRR, safety & activity barriers. Participant demonstrates ability to understand and report RPE using BORG scale, to self-measure pulse accurately, and to acknowledge the importance of the exercise prescription.  Exercise Prescription Goal: Starting with aerobic activity 30 plus minutes a day, 3 days per week for initial exercise prescription. Provide home exercise prescription and guidelines that participant acknowledges understanding prior to discharge.  Activity Barriers & Risk Stratification:     Activity Barriers & Cardiac Risk Stratification - 06/24/16 1226      Activity Barriers & Cardiac Risk  Stratification   Activity Barriers None   Cardiac Risk Stratification High      6 Minute Walk:     6 Minute Walk    Row Name 06/24/16 1320         6 Minute Walk   Phase Initial     Distance 860 feet     Walk Time 6 minutes     # of Rest Breaks 0     MPH 1.63     METS 2.23     RPE 9     VO2 Peak 5.76     Symptoms No     Resting HR 98 bpm     Resting BP 140/60     Max Ex. HR 112 bpm     Max Ex. BP 146/64     2 Minute Post BP 134/66        Oxygen Initial Assessment:   Oxygen Re-Evaluation:   Oxygen Discharge (Final Oxygen Re-Evaluation):   Initial Exercise Prescription:     Initial  Exercise Prescription - 06/24/16 1300      Date of Initial Exercise RX and Referring Provider   Date 06/24/16   Referring Provider End, Harrell Gave MD     Treadmill   MPH 0.8   Grade 0   Minutes 15   METs 1.6     Bike   Level 0.3   Minutes 15   METs 2.2     Arm Ergometer   Level 2   Minutes 15   METs 2     Prescription Details   Frequency (times per week) 3   Duration Progress to 45 minutes of aerobic exercise without signs/symptoms of physical distress     Intensity   THRR 40-80% of Max Heartrate 113-128   Ratings of Perceived Exertion 11-13   Perceived Dyspnea 0-4     Progression   Progression Continue to progress workloads to maintain intensity without signs/symptoms of physical distress.     Resistance Training   Training Prescription Yes   Weight 3 lbs   Reps 10-15      Perform Capillary Blood Glucose checks as needed.  Exercise Prescription Changes:     Exercise Prescription Changes    Row Name 06/24/16 1300 07/11/16 1100 07/18/16 1000 07/26/16 0900 08/08/16 1200     Response to Exercise   Blood Pressure (Admit) 140/60 120/68  - 132/70 118/68   Blood Pressure (Exercise) 146/64 122/64  -  - 128/76   Blood Pressure (Exit) 134/66 132/80  - 128/76 112/64   Heart Rate (Admit) 98 bpm 104 bpm  - 90 bpm 91 bpm   Heart Rate (Exercise) 112 bpm 122 bpm  - 104 bpm 107 bpm   Heart Rate (Exit) 93 bpm 91 bpm  - 95 bpm 88 bpm   Oxygen Saturation (Exercise) 99 %  -  -  -  -   Rating of Perceived Exertion (Exercise) 9 12  - 12 13   Symptoms none  -  -  -  -   Duration  - Progress to 45 minutes of aerobic exercise without signs/symptoms of physical distress  - Progress to 45 minutes of aerobic exercise without signs/symptoms of physical distress Progress to 45 minutes of aerobic exercise without signs/symptoms of physical distress   Intensity  - THRR unchanged  - THRR unchanged THRR unchanged     Progression   Progression  -  -  - Continue to progress workloads to  maintain intensity without signs/symptoms of  physical distress. Continue to progress workloads to maintain intensity without signs/symptoms of physical distress.     Resistance Training   Training Prescription  - Yes  - Yes Yes   Weight  - 3  - 3 3   Reps  - 10-15  - 10-15 10-15     Interval Training   Interval Training  - No  - No No     Treadmill   MPH  - 0.8  - 0.8  -   Grade  - 0  - 0  -   Minutes  - 15  - 15  -   METs  - 1.6  - 1.6  -     NuStep   Level  - 2  - 2 4   Minutes  - 15  - 15 15   METs  - 2  -  - 2.5     Arm Ergometer   Level  -  -  -  - 2   Minutes  -  -  -  - 15   METs  -  -  -  - 2.3     Home Exercise Plan   Plans to continue exercise at  -  - Home  -  -   Frequency  -  - Add 2 additional days to program exercise sessions.  -  -     Exercise Review   Progression -  walk test results  -  -  -  -      Exercise Comments:     Exercise Comments    Row Name 07/01/16 1810 07/11/16 1154 07/18/16 1047 07/24/16 1720 07/26/16 0932   Exercise Comments  First full day of exercise!  Patient was oriented to gym and equipment including functions, settings, policies, and procedures.  Patient's individual exercise prescription and treatment plan were reviewed.  All starting workloads were established based on the results of the 6 minute walk test done at initial orientation visit.  The plan for exercise progression was also introduced and progression will be customized based on patient's performance and goals. Karver has tolerated exercise well in his first weeks. Jordyn uses a stationary bike at home.  Safety measures as well as heart rate, RPE etc were reviewed and patient voiced understanding. Zed noted to have PVC's prior to beginning exercise.  Minimal exercise performed with continuation of PVC's with minimal effort.  No discomfort reported by pt. Django has no symptoms with exercise but continues to have PVCs - he has seen his Dr and is following up about this issue.    Row Name 08/08/16 1255           Exercise Comments Kal has had very few PVCs in last exercise sessions.          Exercise Goals and Review:   Exercise Goals Re-Evaluation :   Discharge Exercise Prescription (Final Exercise Prescription Changes):     Exercise Prescription Changes - 08/08/16 1200      Response to Exercise   Blood Pressure (Admit) 118/68   Blood Pressure (Exercise) 128/76   Blood Pressure (Exit) 112/64   Heart Rate (Admit) 91 bpm   Heart Rate (Exercise) 107 bpm   Heart Rate (Exit) 88 bpm   Rating of Perceived Exertion (Exercise) 13   Duration Progress to 45 minutes of aerobic exercise without signs/symptoms of physical distress   Intensity THRR unchanged     Progression   Progression Continue to progress workloads to maintain  intensity without signs/symptoms of physical distress.     Resistance Training   Training Prescription Yes   Weight 3   Reps 10-15     Interval Training   Interval Training No     NuStep   Level 4   Minutes 15   METs 2.5     Arm Ergometer   Level 2   Minutes 15   METs 2.3      Nutrition:  Target Goals: Understanding of nutrition guidelines, daily intake of sodium '1500mg'$ , cholesterol '200mg'$ , calories 30% from fat and 7% or less from saturated fats, daily to have 5 or more servings of fruits and vegetables.  Biometrics:     Pre Biometrics - 06/24/16 1331      Pre Biometrics   Height 5' 9.9" (1.775 m)   Weight 190 lb 1.6 oz (86.2 kg)   Waist Circumference 39 inches   Hip Circumference 42 inches   Waist to Hip Ratio 0.93 %   BMI (Calculated) 27.4   Single Leg Stand 2.65 seconds       Nutrition Therapy Plan and Nutrition Goals:     Nutrition Therapy & Goals - 08/14/16 1644      Nutrition Therapy   RD appointment defered Yes      Nutrition Discharge: Rate Your Plate Scores:     Nutrition Assessments - 06/24/16 1229      Rate Your Plate Scores   Pre Score --  Jenny Reichmann did not complete this form.       MEDFICTS Scores   Pre Score --  Traci did not complete this form.       Nutrition Goals Re-Evaluation:     Nutrition Goals Re-Evaluation    Row Name 07/24/16 1505 08/14/16 [employer] said he knows what to eat and he is doing well. He prefers not to meet individually with the Cardiac REhab Registerd Valeria.  Koua still eats heart healthy.       Expected Outcome  - Cont eating patterns        Personal Goal #1 Re-Evaluation   Goal Progress Seen Yes  -         Nutrition Goals Discharge (Final Nutrition Goals Re-Evaluation):     Nutrition Goals Re-Evaluation - 08/14/16 1645      Goals   Comment Cully still eats heart healthy.    Expected Outcome Cont eating patterns      Psychosocial: Target Goals: Acknowledge presence or absence of significant depression and/or stress, maximize coping skills, provide positive support system. Participant is able to verbalize types and ability to use techniques and skills needed for reducing stress and depression.   Initial Review & Psychosocial Screening:     Initial Psych Review & Screening - 06/24/16 [place]? Yes     Barriers   Psychosocial barriers to participate in program There are no identifiable barriers or psychosocial needs.     Screening Interventions   Interventions Encouraged to exercise      Quality of Life Scores:      Quality of Life - 06/24/16 1230      Quality of Life Scores   Health/Function Pre --  Tyshon did not complete this form.       PHQ-9: Recent Review Flowsheet Data    Depression screen [facility] 2/9 06/24/2016   Decreased Interest 0   Down,  Depressed, Hopeless 0   PHQ - 2 Score 0   Altered sleeping 0   Tired, decreased energy 0   Change in appetite 0   Feeling bad or failure about yourself  0   Trouble concentrating 0   Moving slowly or fidgety/restless 0   Suicidal thoughts 0   PHQ-9 Score 0     Interpretation of Total  Score  Total Score Depression Severity:  1-4 = Minimal depression, 5-9 = Mild depression, 10-14 = Moderate depression, 15-19 = Moderately severe depression, 20-27 = Severe depression   Psychosocial Evaluation and Intervention:     Psychosocial Evaluation - 07/01/16 1721      Psychosocial Evaluation & Interventions   Interventions Encouraged to exercise with the program and follow exercise prescription   Comments Counselor met with Mr. Nakamura today for initial psychosocial evaluation.  He is a well-adjusted 83 year old who had mitral valve repair last October.  He has a strong support system with a spouse of 27 years and a son and daughter who live close by, as well as Mr. Alfonso Patten is actively involved in his local church.  He states he sleeps well and has a good appetite.  He denies a history of depression or anxiety or any current symptoms.  He reports typically being in a positive mood and has minimal stress in his life.  Mr. Alfonso Patten loves to fish and hunt, so his goals are to increase his stamina and strength to be able to enjoy these aspects of life.  He is encouraged to exercise consistently.  Staff will follow with him over the course of this program.        Psychosocial Re-Evaluation:     Psychosocial Re-Evaluation    Juda Name 07/24/16 1507 08/14/16 1646           Psychosocial Re-Evaluation   Comments Hayato said he goes fishing with his son and gets to spend time with his wife and family. I advised Toni not to go fishing at [employer] 2 days ago until he spoke with his MD since he had a lot of PVCs which Dr. Darnelle Bos office received by fax on 2/19.  Hildreth has a supportive family. Vonn reports he has done more fishing and gardening since he feels better.       Interventions Encouraged to attend Cardiac Rehabilitation for the exercise  -         Psychosocial Discharge (Final Psychosocial Re-Evaluation):     Psychosocial Re-Evaluation - 08/14/16 1646      Psychosocial Re-Evaluation    Comments Yarden has a supportive family. Rayquan reports he has done more fishing and gardening since he feels better.       Vocational Rehabilitation: Provide vocational rehab assistance to qualifying candidates.   Vocational Rehab Evaluation & Intervention:     Vocational Rehab - 06/24/16 1227      Initial Vocational Rehab Evaluation & Intervention   Assessment shows need for Vocational Rehabilitation No      Education: Education Goals: Education classes will be provided on a weekly basis, covering required topics. Participant will state understanding/return demonstration of topics presented.  Learning Barriers/Preferences:     Learning Barriers/Preferences - 06/24/16 1226      Learning Barriers/Preferences   Learning Barriers None   Learning Preferences None      Education Topics: General Nutrition Guidelines/Fats and Fiber: -Group instruction provided by verbal, written material, models and posters to present the general guidelines for heart healthy nutrition. Gives  an explanation and review of dietary fats and fiber. Flowsheet Row Cardiac Rehab from 08/07/2016 in [facility] Cardiac and Pulmonary Rehab  Date  08/05/16  Educator  PI  Instruction Review Code  2- meets goals/outcomes      Controlling Sodium/Reading Food Labels: -Group verbal and written material supporting the discussion of sodium use in heart healthy nutrition. Review and explanation with models, verbal and written materials for utilization of the food label.   Exercise Physiology & Risk Factors: - Group verbal and written instruction with models to review the exercise physiology of the cardiovascular system and associated critical values. Details cardiovascular disease risk factors and the goals associated with each risk factor.   Aerobic Exercise & Resistance Training: - Gives group verbal and written discussion on the health impact of inactivity. On the components of aerobic and resistive training programs  and the benefits of this training and how to safely progress through these programs.   Flexibility, Balance, General Exercise Guidelines: - Provides group verbal and written instruction on the benefits of flexibility and balance training programs. Provides general exercise guidelines with specific guidelines to those with heart or lung disease. Demonstration and skill practice provided. Flowsheet Row Cardiac Rehab from 08/07/2016 in [facility] Cardiac and Pulmonary Rehab  Date  07/01/16  Educator  [facility]  Instruction Review Code  2- meets goals/outcomes      Stress Management: - Provides group verbal and written instruction about the health risks of elevated stress, cause of high stress, and healthy ways to reduce stress. Flowsheet Row Cardiac Rehab from 08/07/2016 in [facility] Cardiac and Pulmonary Rehab  Date  07/10/16  Educator  [facility]  Instruction Review Code  2- meets goals/outcomes      Depression: - Provides group verbal and written instruction on the correlation between heart/lung disease and depressed mood, treatment options, and the stigmas associated with seeking treatment. Flowsheet Row Cardiac Rehab from 08/07/2016 in [facility] Cardiac and Pulmonary Rehab  Date  08/07/16  Educator  [facility]  Instruction Review Code  2- meets goals/outcomes      Anatomy & Physiology of the Heart: - Group verbal and written instruction and models provide basic cardiac anatomy and physiology, with the coronary electrical and arterial systems. Review of: AMI, Angina, Valve disease, Heart Failure, Cardiac Arrhythmia, Pacemakers, and the ICD. Flowsheet Row Cardiac Rehab from 08/07/2016 in [facility] Cardiac and Pulmonary Rehab  Date  07/08/16  Educator  CE  Instruction Review Code  2- meets goals/outcomes      Cardiac Procedures: - Group verbal and written instruction and models to describe the testing methods done to diagnose heart disease. Reviews the outcomes of the test results. Describes the treatment choices: Medical  Management, Angioplasty, or Coronary Bypass Surgery. Flowsheet Row Cardiac Rehab from 08/07/2016 in [facility] Cardiac and Pulmonary Rehab  Date  07/15/16  Educator  CE  Instruction Review Code  2- meets goals/outcomes      Cardiac Medications: - Group verbal and written instruction to review commonly prescribed medications for heart disease. Reviews the medication, class of the drug, and side effects. Includes the steps to properly store meds and maintain the prescription regimen. Flowsheet Row Cardiac Rehab from 08/07/2016 in [facility] Cardiac and Pulmonary Rehab  Date  07/24/16  Educator  CE  Instruction Review Code  2- meets goals/outcomes      Go Sex-Intimacy & Heart Disease, Get SMART - Goal Setting: - Group verbal and written instruction through game format to discuss heart disease and the return to sexual intimacy.  Provides group verbal and written material to discuss and apply goal setting through the application of the S.M.A.R.T. Method. Flowsheet Row Cardiac Rehab from 08/07/2016 in [facility] Cardiac and Pulmonary Rehab  Date  07/15/16  Educator  CE  Instruction Review Code  2- meets goals/outcomes      Other Matters of the Heart: - Provides group verbal, written materials and models to describe Heart Failure, Angina, Valve Disease, and Diabetes in the realm of heart disease. Includes description of the disease process and treatment options available to the cardiac patient. Flowsheet Row Cardiac Rehab from 08/07/2016 in [facility] Cardiac and Pulmonary Rehab  Date  07/08/16  Educator  CE  Instruction Review Code  2- meets goals/outcomes      Exercise & Equipment Safety: - Individual verbal instruction and demonstration of equipment use and safety with use of the equipment. Flowsheet Row Cardiac Rehab from 08/07/2016 in [facility] Cardiac and Pulmonary Rehab  Date  06/24/16  Educator  C. EnterkinRN  Instruction Review Code  1- partially meets, needs review/practice      Infection Prevention: -  Provides verbal and written material to individual with discussion of infection control including proper hand washing and proper equipment cleaning during exercise session. Flowsheet Row Cardiac Rehab from 08/07/2016 in [facility] Cardiac and Pulmonary Rehab  Date  06/24/16  Educator  C. EnterkinRN  Instruction Review Code  2- meets goals/outcomes      Falls Prevention: - Provides verbal and written material to individual with discussion of falls prevention and safety. Flowsheet Row Cardiac Rehab from 08/07/2016 in [facility] Cardiac and Pulmonary Rehab  Date  06/24/16  Educator  C. Dunnavant  Instruction Review Code  2- meets goals/outcomes      Diabetes: - Individual verbal and written instruction to review signs/symptoms of diabetes, desired ranges of glucose level fasting, after meals and with exercise. Advice that pre and post exercise glucose checks will be done for 3 sessions at entry of program.    Knowledge Questionnaire Score:     Knowledge Questionnaire Score - 06/24/16 1227      Knowledge Questionnaire Score   Pre Score --  Edouard declined to do this questionnaire .      Core Components/Risk Factors/Patient Goals at Admission:     Personal Goals and Risk Factors at Admission - 06/24/16 1229      Core Components/Risk Factors/Patient Goals on Admission    Weight Management Yes;Weight Loss   Intervention Weight Management: Develop a combined nutrition and exercise program designed to reach desired caloric intake, while maintaining appropriate intake of nutrient and fiber, sodium and fats, and appropriate energy expenditure required for the weight goal.;Weight Management: Provide education and appropriate resources to help participant work on and attain dietary goals.   Admit Weight 190 lb 1.6 oz (86.2 kg)   Goal Weight: Short Term 185 lb (83.9 kg)   Goal Weight: Long Term 180 lb (81.6 kg)   Expected Outcomes Short Term: Continue to assess and modify interventions until short term  weight is achieved;Long Term: Adherence to nutrition and physical activity/exercise program aimed toward attainment of established weight goal;Weight Loss: Understanding of general recommendations for a balanced deficit meal plan, which promotes 1-2 lb weight loss per week and includes a negative energy balance of [phone] kcal/d;Understanding recommendations for meals to include 15-35% energy as protein, 25-35% energy from fat, 35-60% energy from carbohydrates, less than '200mg'$  of dietary cholesterol, 20-35 gm of total fiber daily;Understanding of distribution of calorie intake throughout the day with the  consumption of 4-5 meals/snacks   Sedentary Yes   Intervention Provide advice, education, support and counseling about physical activity/exercise needs.;Develop an individualized exercise prescription for aerobic and resistive training based on initial evaluation findings, risk stratification, comorbidities and participant's personal goals.   Expected Outcomes Achievement of increased cardiorespiratory fitness and enhanced flexibility, muscular endurance and strength shown through measurements of functional capacity and personal statement of participant.   Increase Strength and Stamina Yes   Intervention Provide advice, education, support and counseling about physical activity/exercise needs.;Develop an individualized exercise prescription for aerobic and resistive training based on initial evaluation findings, risk stratification, comorbidities and participant's personal goals.   Expected Outcomes Achievement of increased cardiorespiratory fitness and enhanced flexibility, muscular endurance and strength shown through measurements of functional capacity and personal statement of participant.   Hypertension Yes   Intervention Provide education on lifestyle modifcations including regular physical activity/exercise, weight management, moderate sodium restriction and increased consumption of fresh fruit,  vegetables, and low fat dairy, alcohol moderation, and smoking cessation.;Monitor prescription use compliance.   Expected Outcomes Short Term: Continued assessment and intervention until BP is < 140/82m HG in hypertensive participants. < 130/865mHG in hypertensive participants with diabetes, heart failure or chronic kidney disease.;Long Term: Maintenance of blood pressure at goal levels.   Lipids Yes   Intervention Provide education and support for participant on nutrition & aerobic/resistive exercise along with prescribed medications to achieve LDL '70mg'$ , HDL >'40mg'$ .   Expected Outcomes Short Term: Participant states understanding of desired cholesterol values and is compliant with medications prescribed. Participant is following exercise prescription and nutrition guidelines.;Long Term: Cholesterol controlled with medications as prescribed, with individualized exercise RX and with personalized nutrition plan. Value goals: LDL < '70mg'$ , HDL > 40 mg.      Core Components/Risk Factors/Patient Goals Review:      Goals and Risk Factor Review    Row Name 07/18/16 1640 07/24/16 1409 07/24/16 1503 08/14/16 1645       Core Components/Risk Factors/Patient Goals Review   Personal Goals Review Increase Strength and Stamina;Hypertension  - Weight Management/Obesity;Increase Strength and Stamina;Hypertension;Lipids Hypertension    Review Johns BP has been within normal range while in HT.  He can tell a difference in ADLs since beginning exercise - they are easier.  His wife monitors what they eat - he states she has high cholesterol and "is up on all that".  He does not have other concerns at this time.   Multiple PVCS this is the note in EPIC/CHL copied; [facility] reviewed by Dr. EnSaunders RevelI left a vm to check on JoSerita GritHe had frequent PVC's but had not c/o during his last Cardiac Rehab visit. Jash has increased  his strength and states he feels better. His blood pressure is stable. Thamas said he knows what to eat and knows exercise will help his lipid panel.  Avin reports his blood pressure readings have been good. Trellis reports he is able to do more things like gardening and fishing. He says he is getting an EKG tomorrow or something to check the fluid around his heart.     Expected Outcomes  -  - Heart healthy lifestyle.  -       Core Components/Risk Factors/Patient Goals at Discharge (Final Review):      Goals and Risk Factor Review - 08/14/16 1645      Core Components/Risk Factors/Patient Goals Review   Personal  Goals Review Hypertension   Review Kolsen reports his blood pressure readings have been good. Emerson reports he is able to do more things like gardening and fishing. He says he is getting an EKG tomorrow or something to check the fluid around his heart.       ITP Comments:     ITP Comments    Row Name 06/24/16 1225 06/24/16 1232 07/03/16 0648 07/22/16 1811 07/22/16 1814   ITP Comments Hussien said he knows what to eat and declined an individual appt with the Cardiac Rehab REgistered [place]. ITP created during Medical review today. Documention of Dx Discharge Summary 03/29/2016 [facility] 30 day review. Continue with ITP unless directed changes per Medical Director review.  New to program Multiple PVc's seen today, some couplets. No symptoms reported. Will forward EKG strips to cardiologist. Jenny Reichmann has been advised to call physycian for follow up.    Dequavius was given some OJ and a banana to eat this afternoon   Row Name 07/24/16 1502 07/31/16 0634         ITP Comments I left a vm to check on [employer]. He had frequent PVC's but had not c/o during his last Cardiac Rehab visit. I actually stopped him from exercising and only allowed him to do some brief stretching seated at the end of Cardiac Rehab.  30 day review. Continue with ITP unless directed changes per Medical Director  review.         Comments:

## 2016-08-14 NOTE — Progress Notes (Signed)
Daily Session Note  Patient Details  Name: Roy Palmer MRN: [id number] Date of Birth: August 12, 1929 Referring Provider:   Flowsheet Row Cardiac Rehab from 06/24/2016 in [facility] Cardiac and Pulmonary Rehab  Referring Provider  End, Harrell Gave MD      Encounter Date: 08/14/2016  Check In:     Session Check In - 08/14/16 1644      Check-In   Location [facility]-Cardiac & Pulmonary Rehab   Staff Present Gerlene Burdock, RN, Vickki Hearing, BA, ACSM CEP, Exercise Physiologist;Patricia Surles RN BSN   Supervising physician immediately available to respond to emergencies See telemetry face sheet for immediately available ER MD   Medication changes reported     No   Fall or balance concerns reported    No   Warm-up and Cool-down Performed on first and last piece of equipment   Resistance Training Performed Yes   VAD Patient? No     Pain Assessment   Currently in Pain? No/denies         History  Smoking Status  . Never Smoker  Smokeless Tobacco  . Never Used    Goals Met:  Proper associated with RPD/PD & O2 Sat Exercise tolerated well  Goals Unmet:  Not Applicable  Comments:     Dr. Emily Filbert is Medical Director for [place] and [facility].

## 2016-08-15 DIAGNOSIS — Z48812 Encounter for surgical aftercare following surgery on the circulatory system: Secondary | ICD-10-CM | POA: Diagnosis not present

## 2016-08-15 DIAGNOSIS — Z9889 Other specified postprocedural states: Secondary | ICD-10-CM

## 2016-08-15 NOTE — Progress Notes (Signed)
Daily Session Note  Patient Details  Name: Roy Palmer MRN: [id number] Date of Birth: [birth year] Referring Provider:     Cardiac Rehab from 06/24/2016 in [facility] Cardiac and Pulmonary Rehab  Referring Provider  End, Harrell Gave MD      Encounter Date: 08/15/2016  Check In:     Session Check In - 08/15/16 1709      Check-In   Location [facility]-Cardiac & Pulmonary Rehab   Staff Present Levell July RN Moises Blood, BS, ACSM CEP, Exercise Physiologist;Cadence Minton Oletta Darter, [place], ACSM CEP, Exercise Physiologist   Supervising physician immediately available to respond to emergencies See telemetry face sheet for immediately available ER MD   Medication changes reported     No   Fall or balance concerns reported    No   Warm-up and Cool-down Performed on first and last piece of equipment   Resistance Training Performed Yes   VAD Patient? No     Pain Assessment   Currently in Pain? No/denies         History  Smoking Status  . Never Smoker  Smokeless Tobacco  . Never Used    Goals Met:  Independence with exercise equipment Exercise tolerated well No report of cardiac concerns or symptoms Strength training completed today  Goals Unmet:  Not Applicable  Comments: Pt able to follow exercise prescription today without complaint.  Will continue to monitor for progression.   Dr. Emily Filbert is Medical Director for [place] and [facility].

## 2016-08-19 ENCOUNTER — Encounter: Payer: Medicare Other | Admitting: *Deleted

## 2016-08-19 DIAGNOSIS — Z9889 Other specified postprocedural states: Secondary | ICD-10-CM

## 2016-08-19 DIAGNOSIS — Z48812 Encounter for surgical aftercare following surgery on the circulatory system: Secondary | ICD-10-CM | POA: Diagnosis not present

## 2016-08-19 NOTE — Progress Notes (Signed)
Daily Session Note  Patient Details  Name: EGOR FULLILOVE MRN: [id number] Date of Birth: 12/25/1929 Referring Provider:     Cardiac Rehab from 06/24/2016 in [facility] Cardiac and Pulmonary Rehab  Referring Provider  End, Harrell Gave MD      Encounter Date: 08/19/2016  Check In:     Session Check In - 08/19/16 1633      Check-In   Location [facility]-Cardiac & Pulmonary Rehab   Staff Present Gerlene Burdock, RN, BSN;Susanne Bice, RN, BSN, Laveda Norman, BS, ACSM CEP, Exercise Physiologist   Supervising physician immediately available to respond to emergencies See telemetry face sheet for immediately available ER MD   Medication changes reported     No   Fall or balance concerns reported    No   Warm-up and Cool-down Performed on first and last piece of equipment   Resistance Training Performed Yes   VAD Patient? No     Pain Assessment   Currently in Pain? No/denies         History  Smoking Status  . Never Smoker  Smokeless Tobacco  . Never Used    Goals Met:  Proper associated with RPD/PD & O2 Sat Exercise tolerated well  Goals Unmet:  Not Applicable  Comments:     Dr. Emily Filbert is Medical Director for [place] and [facility].

## 2016-08-21 DIAGNOSIS — Z48812 Encounter for surgical aftercare following surgery on the circulatory system: Secondary | ICD-10-CM | POA: Diagnosis not present

## 2016-08-21 DIAGNOSIS — Z9889 Other specified postprocedural states: Secondary | ICD-10-CM

## 2016-08-21 NOTE — Progress Notes (Signed)
Daily Session Note  Patient Details  Name: Roy Palmer MRN: [id number] Date of Birth: December 03, 1929 Referring Provider:     Cardiac Rehab from 06/24/2016 in [facility] Cardiac and Pulmonary Rehab  Referring Provider  End, Harrell Gave MD      Encounter Date: 08/21/2016  Check In:     Session Check In - 08/21/16 1621      Check-In   Location [facility]-Cardiac & Pulmonary Rehab   Staff Present Gerlene Burdock, RN, BSN;Leaf [employer], DPT, Burlene Arnt, BA, ACSM CEP, Exercise Physiologist   Supervising physician immediately available to respond to emergencies See telemetry face sheet for immediately available ER MD   Medication changes reported     No   Fall or balance concerns reported    No   Tobacco Cessation No Change   Warm-up and Cool-down Performed on first and last piece of equipment   Resistance Training Performed Yes   VAD Patient? No     Pain Assessment   Currently in Pain? No/denies   Multiple Pain Sites No         History  Smoking Status  . Never Smoker  Smokeless Tobacco  . Never Used    Goals Met:  Independence with exercise equipment  Goals Unmet:  Not Applicable  Comments: Patient completed exercise prescription and all exercise goals during rehab session. The exercise was tolerated well and the patient is progressing in the program.    Dr. Emily Filbert is Medical Director for [place] and [facility].

## 2016-08-22 VITALS — Ht 71.0 in | Wt 195.0 lb

## 2016-08-22 DIAGNOSIS — Z9889 Other specified postprocedural states: Secondary | ICD-10-CM

## 2016-08-22 DIAGNOSIS — Z48812 Encounter for surgical aftercare following surgery on the circulatory system: Secondary | ICD-10-CM | POA: Diagnosis not present

## 2016-08-22 NOTE — Progress Notes (Signed)
Daily Session Note  Patient Details  Name: DEMIR TITSWORTH MRN: [id number] Date of Birth: [birth year] Referring Provider:     Cardiac Rehab from 06/24/2016 in [facility] Cardiac and Pulmonary Rehab  Referring Provider  End, Harrell Gave MD      Encounter Date: 08/22/2016  Check In:     Session Check In - 08/22/16 1714      Check-In   Location [facility]-Cardiac & Pulmonary Rehab   Staff Present Heath Lark, RN, BSN, Laveda Norman, BS, ACSM CEP, Exercise Physiologist;Shenay Torti Brayton El, DPT, CEEA   Supervising physician immediately available to respond to emergencies See telemetry face sheet for immediately available ER MD   Medication changes reported     No   Fall or balance concerns reported    No   Tobacco Cessation No Change   Warm-up and Cool-down Performed on first and last piece of equipment   Resistance Training Performed Yes   VAD Patient? No     Pain Assessment   Currently in Pain? No/denies   Multiple Pain Sites No           Exercise Prescription Changes - 08/22/16 1300      Response to Exercise   Blood Pressure (Admit) 138/70   Blood Pressure (Exercise) 132/70   Blood Pressure (Exit) 126/66   Heart Rate (Admit) 53 bpm   Heart Rate (Exercise) 120 bpm   Heart Rate (Exit) 97 bpm   Rating of Perceived Exertion (Exercise) 12   Duration Progress to 45 minutes of aerobic exercise without signs/symptoms of physical distress   Intensity THRR unchanged     Resistance Training   Training Prescription Yes   Weight 3   Reps 10-15     Interval Training   Interval Training No     Treadmill   MPH 1.4   Grade 0   Minutes 15   METs 2.07     NuStep   Level 4   Minutes 15      History  Smoking Status  . Never Smoker  Smokeless Tobacco  . Never Used    Goals Met:  Independence with exercise equipment  Goals Unmet:  Not Applicable  Comments: Patient completed exercise prescription and all exercise goals during rehab session. The exercise was tolerated well and  the patient is progressing in the program.    Dr. Emily Filbert is Medical Director for [place] and [facility].

## 2016-08-26 ENCOUNTER — Encounter: Payer: Medicare Other | Admitting: *Deleted

## 2016-08-26 DIAGNOSIS — Z48812 Encounter for surgical aftercare following surgery on the circulatory system: Secondary | ICD-10-CM | POA: Diagnosis not present

## 2016-08-26 DIAGNOSIS — Z9889 Other specified postprocedural states: Secondary | ICD-10-CM

## 2016-08-26 NOTE — Progress Notes (Signed)
Discharge Summary  Patient Details  Name: Roy Palmer MRN: [id number] Date of Birth: 01-20-1930 Referring Provider:     Cardiac Rehab from 06/24/2016 in [facility] Cardiac and Pulmonary Rehab  Referring Provider  End, Cristal Deer MD       Number of Visits: 36/36  Reason for Discharge:  Patient reached a stable level of exercise. Patient independent in their exercise.  Smoking History:  History  Smoking Status  . Never Smoker  Smokeless Tobacco  . Never Used    Diagnosis:  S/P mitral valve repair  ADL UCSD:   Initial Exercise Prescription:     Initial Exercise Prescription - 06/24/16 1300      Date of Initial Exercise RX and Referring Provider   Date 06/24/16   Referring Provider End, Cristal Deer MD     Treadmill   MPH 0.8   Grade 0   Minutes 15   METs 1.6     Bike   Level 0.3   Minutes 15   METs 2.2     Arm Ergometer   Level 2   Minutes 15   METs 2     Prescription Details   Frequency (times per week) 3   Duration Progress to 45 minutes of aerobic exercise without signs/symptoms of physical distress     Intensity   THRR 40-80% of Max Heartrate 113-128   Ratings of Perceived Exertion 11-13   Perceived Dyspnea 0-4     Progression   Progression Continue to progress workloads to maintain intensity without signs/symptoms of physical distress.     Resistance Training   Training Prescription Yes   Weight 3 lbs   Reps 10-15      Discharge Exercise Prescription (Final Exercise Prescription Changes):     Exercise Prescription Changes - 08/22/16 1300      Response to Exercise   Blood Pressure (Admit) 138/70   Blood Pressure (Exercise) 132/70   Blood Pressure (Exit) 126/66   Heart Rate (Admit) 53 bpm   Heart Rate (Exercise) 120 bpm   Heart Rate (Exit) 97 bpm   Rating of Perceived Exertion (Exercise) 12   Duration Progress to 45 minutes of aerobic exercise without signs/symptoms of physical distress   Intensity THRR unchanged     Resistance  Training   Training Prescription Yes   Weight 3   Reps 10-15     Interval Training   Interval Training No     Treadmill   MPH 1.4   Grade 0   Minutes 15   METs 2.07     NuStep   Level 4   Minutes 15      Functional Capacity:     6 Minute Walk    Row Name 06/24/16 1320 08/22/16 1715       6 Minute Walk   Phase Initial Discharge    Distance 860 feet 960 feet    Distance % Change  - 12 %    Walk Time 6 minutes 6 minutes    # of Rest Breaks 0  -    MPH 1.63 1.8    METS 2.23  -    RPE 9 13    VO2 Peak 5.76  -    Symptoms No No    Resting HR 98 bpm 84 bpm    Resting BP 140/60 118/68    Max Ex. HR 112 bpm 102 bpm    Max Ex. BP 146/64 152/78    2 Minute Post BP 134/66  -  Psychological, QOL, Others - Outcomes: PHQ 2/9: Depression screen PHQ 2/9 06/24/2016  Decreased Interest 0  Down, Depressed, Hopeless 0  PHQ - 2 Score 0  Altered sleeping 0  Tired, decreased energy 0  Change in appetite 0  Feeling bad or failure about yourself  0  Trouble concentrating 0  Moving slowly or fidgety/restless 0  Suicidal thoughts 0  PHQ-9 Score 0    Quality of Life:     Quality of Life - 06/24/16 1230      Quality of Life Scores   Health/Function Pre --  Roy Palmer did not complete this form.       Personal Goals: Goals established at orientation with interventions provided to work toward goal.     Personal Goals and Risk Factors at Admission - 06/24/16 1229      Core Components/Risk Factors/Patient Goals on Admission    Weight Management Yes;Weight Loss   Intervention Weight Management: Develop a combined nutrition and exercise program designed to reach desired caloric intake, while maintaining appropriate intake of nutrient and fiber, sodium and fats, and appropriate energy expenditure required for the weight goal.;Weight Management: Provide education and appropriate resources to help participant work on and attain dietary goals.   Admit Weight 190 lb 1.6 oz  (86.2 kg)   Goal Weight: Short Term 185 lb (83.9 kg)   Goal Weight: Long Term 180 lb (81.6 kg)   Expected Outcomes Short Term: Continue to assess and modify interventions until short term weight is achieved;Long Term: Adherence to nutrition and physical activity/exercise program aimed toward attainment of established weight goal;Weight Loss: Understanding of general recommendations for a balanced deficit meal plan, which promotes 1-2 lb weight loss per week and includes a negative energy balance of [phone] kcal/d;Understanding recommendations for meals to include 15-35% energy as protein, 25-35% energy from fat, 35-60% energy from carbohydrates, less than 200mg  of dietary cholesterol, 20-35 gm of total fiber daily;Understanding of distribution of calorie intake throughout the day with the consumption of 4-5 meals/snacks   Sedentary Yes   Intervention Provide advice, education, support and counseling about physical activity/exercise needs.;Develop an individualized exercise prescription for aerobic and resistive training based on initial evaluation findings, risk stratification, comorbidities and participant's personal goals.   Expected Outcomes Achievement of increased cardiorespiratory fitness and enhanced flexibility, muscular endurance and strength shown through measurements of functional capacity and personal statement of participant.   Increase Strength and Stamina Yes   Intervention Provide advice, education, support and counseling about physical activity/exercise needs.;Develop an individualized exercise prescription for aerobic and resistive training based on initial evaluation findings, risk stratification, comorbidities and participant's personal goals.   Expected Outcomes Achievement of increased cardiorespiratory fitness and enhanced flexibility, muscular endurance and strength shown through measurements of functional capacity and personal statement of participant.   Hypertension Yes    Intervention Provide education on lifestyle modifcations including regular physical activity/exercise, weight management, moderate sodium restriction and increased consumption of fresh fruit, vegetables, and low fat dairy, alcohol moderation, and smoking cessation.;Monitor prescription use compliance.   Expected Outcomes Short Term: Continued assessment and intervention until BP is < 140/44mm HG in hypertensive participants. < 130/28mm HG in hypertensive participants with diabetes, heart failure or chronic kidney disease.;Long Term: Maintenance of blood pressure at goal levels.   Lipids Yes   Intervention Provide education and support for participant on nutrition & aerobic/resistive exercise along with prescribed medications to achieve LDL 70mg , HDL >40mg .   Expected Outcomes Short Term: Participant states understanding of desired cholesterol values  and is compliant with medications prescribed. Participant is following exercise prescription and nutrition guidelines.;Long Term: Cholesterol controlled with medications as prescribed, with individualized exercise RX and with personalized nutrition plan. Value goals: LDL < 70mg , HDL > 40 mg.       Personal Goals Discharge:     Goals and Risk Factor Review    Row Name 07/18/16 1640 07/24/16 1409 07/24/16 1503 08/14/16 1645       Core Components/Risk Factors/Patient Goals Review   Personal Goals Review Increase Strength and Stamina;Hypertension  - Weight Management/Obesity;Increase Strength and Stamina;Hypertension;Lipids Hypertension    Review Roy Palmer BP has been within normal range while in HT.  He can tell a difference in ADLs since beginning exercise - they are easier.  His wife monitors what they eat - he states she has high cholesterol and "is up on all that".  He does not have other concerns at this time.   Multiple PVCS this is the note in EPIC/CHL copied; [facility] Cardiac Rehab February EKG readings reviewed by Dr. Okey Dupre.  [facility] Cardiac Rehab February  EKG readings reviewed by Dr. Okey Dupre. I left a vm to check on Roy Palmer. He had frequent PVC's but had not c/o during his last Cardiac Rehab visit. Roy Palmer has increased his strength and states he feels better. His blood pressure is stable. Roy Palmer said he knows what to eat and knows exercise will help his lipid panel.  Roy Palmer reports his blood pressure readings have been good. Roy Palmer reports he is able to do more things like gardening and fishing. He says he is getting an EKG tomorrow or something to check the fluid around his heart.     Expected Outcomes  -  - Heart healthy lifestyle.  -       Nutrition & Weight - Outcomes:     Pre Biometrics - 06/24/16 1331      Pre Biometrics   Height 5' 9.9" (1.775 m)   Weight 190 lb 1.6 oz (86.2 kg)   Waist Circumference 39 inches   Hip Circumference 42 inches   Waist to Hip Ratio 0.93 %   BMI (Calculated) 27.4   Single Leg Stand 2.65 seconds         Post Biometrics - 08/22/16 1720       Post  Biometrics   Height 5\' 11"  (1.803 m)   Weight 195 lb (88.5 kg)   Waist Circumference 41 inches   Hip Circumference 41.5 inches   Waist to Hip Ratio 0.99 %   BMI (Calculated) 27.3      Nutrition:     Nutrition Therapy & Goals - 08/14/16 1644      Nutrition Therapy   RD appointment defered Yes      Nutrition Discharge:     Nutrition Assessments - 06/24/16 1229      Rate Your Plate Scores   Pre Score --  Roy Palmer did not complete this form.      MEDFICTS Scores   Pre Score --  Roy Palmer did not complete this form.       Education Questionnaire Score:     Knowledge Questionnaire Score - 06/24/16 1227      Knowledge Questionnaire Score   Pre Score --  Roy Palmer declined to do this questionnaire .      Goals reviewed with patient; copy given to patient.

## 2016-08-26 NOTE — Progress Notes (Signed)
Daily Session Note  Patient Details  Name: Roy Palmer MRN: [id number] Date of Birth: 02-Feb-1930 Referring Provider:     Cardiac Rehab from 06/24/2016 in [facility] Cardiac and Pulmonary Rehab  Referring Provider  End, Harrell Gave MD      Encounter Date: 08/26/2016  Check In:     Session Check In - 08/26/16 1631      Check-In   Location [facility]-Cardiac & Pulmonary Rehab   Staff Present Heath Lark, RN, BSN, Laveda Norman, BS, ACSM CEP, Exercise Physiologist;Carroll Enterkin, RN, BSN   Supervising physician immediately available to respond to emergencies See telemetry face sheet for immediately available ER MD   Medication changes reported     No   Fall or balance concerns reported    No   Warm-up and Cool-down Performed on first and last piece of equipment   Resistance Training Performed Yes   VAD Patient? No     Pain Assessment   Currently in Pain? No/denies         History  Smoking Status  . Never Smoker  Smokeless Tobacco  . Never Used    Goals Met:  Exercise tolerated well No report of cardiac concerns or symptoms Strength training completed today  Goals Unmet:  Not Applicable  Comments: Doing well with exercise prescription progression.    Dr. Emily Filbert is Medical Director for [place] and [facility].

## 2016-08-26 NOTE — Progress Notes (Signed)
Cardiac Individual Treatment Plan  Patient Details  Name: Roy Palmer MRN: [id number] Date of Birth: [birth year] Referring Provider:     Cardiac Rehab from 06/24/2016 in [facility] Cardiac and Pulmonary Rehab  Referring Provider  End, Harrell Gave MD      Initial Encounter Date:    Cardiac Rehab from 06/24/2016 in [facility] Cardiac and Pulmonary Rehab  Date  06/24/16  Referring Provider  End, Harrell Gave MD      Visit Diagnosis: S/P mitral valve repair  Patient's Home Medications on Admission:  Current Outpatient Prescriptions:  .  aspirin EC 81 MG tablet, Take 1 tablet (81 mg total) by mouth daily., Disp: , Rfl:   Past Medical History: Past Medical History:  Diagnosis Date  . AAA (abdominal aortic aneurysm) without rupture ([place]) 03/12/2016   Small - measured 3.3 x 3.5 cm by CTA  . CKD (chronic kidney disease) stage 3, GFR 30-59 ml/min   . Essential hypertension   . Hyperlipidemia   . Hypertensive kidney disease   . Incidental pulmonary nodule 03/12/2016   Vague opacity right lung base requires radiographic follow up - index of suspicion is LOW  . Near syncope    Cardiogenic, related to an SVT and severe MR  . Non-sustained ventricular tachycardia ([place])   . S/P minimally invasive mitral valve repair 03/19/2016   Complex valvuloplasty including triangular resection of flail segment of posterior leaflet, chordal transposition x1, artificial Gore-tex neochord placement x4 and 34 mm Sorin Memo 3D ring annuloplasty via right mini thoracotomy approach with clipping of LA appendage  . Severe mitral regurgitation by prior echocardiogram 02/23/2016   Confirmed by TEE  . Syncope 03/15/2016  . Thoracic aortic aneurysm ([place]) 03/12/2016   Very very mild fusiform enlargement of distal transverse and proximal descending thoracic aorta noted on CTA  . Trigeminal neuralgia     Tobacco Use: History  Smoking Status  . Never Smoker  Smokeless Tobacco  . Never Used    Labs: Recent Review  Flowsheet Data    Labs for ITP Cardiac and Pulmonary Rehab Latest Ref Rng & Units 03/19/2016 03/19/2016 03/19/2016 03/20/2016 03/22/2016   Cholestrol 0 - 200 mg/dL - - - - -   LDLCALC 0 - 99 mg/dL - - - - -   HDL >40 mg/dL - - - - -   Trlycerides <150 mg/dL - - - - -   Hemoglobin A1c 4.8 - 5.6 % - - - - -   PHART 7.350 - 7.450 7.353 - 7.416 - -   PCO2ART 32.0 - 48.0 mmHg 40.1 - 29.9(L) - -   HCO3 20.0 - 28.0 mmol/L 22.4 - 19.3(L) - -   TCO2 0 - 100 mmol/L '24 24 20 23 24   '$ ACIDBASEDEF 0.0 - 2.0 mmol/L 3.0(H) - 4.0(H) - -   O2SAT % 96.0 - 98.0 - -       Exercise Target Goals:    Exercise Program Goal: Individual exercise prescription set with THRR, safety & activity barriers. Participant demonstrates ability to understand and report RPE using BORG scale, to self-measure pulse accurately, and to acknowledge the importance of the exercise prescription.  Exercise Prescription Goal: Starting with aerobic activity 30 plus minutes a day, 3 days per week for initial exercise prescription. Provide home exercise prescription and guidelines that participant acknowledges understanding prior to discharge.  Activity Barriers & Risk Stratification:     Activity Barriers & Cardiac Risk Stratification - 06/24/16 1226      Activity Barriers & Cardiac Risk  Stratification   Activity Barriers None   Cardiac Risk Stratification High      6 Minute Walk:     6 Minute Walk    Row Name 06/24/16 1320 08/22/16 1715       6 Minute Walk   Phase Initial Discharge    Distance 860 feet 960 feet    Distance % Change  - 12 %    Walk Time 6 minutes 6 minutes    # of Rest Breaks 0  -    MPH 1.63 1.8    METS 2.23  -    RPE 9 13    VO2 Peak 5.76  -    Symptoms No No    Resting HR 98 bpm 84 bpm    Resting BP 140/60 118/68    Max Ex. HR 112 bpm 102 bpm    Max Ex. BP 146/64 152/78    2 Minute Post BP 134/66  -       Oxygen Initial Assessment:   Oxygen Re-Evaluation:   Oxygen Discharge  (Final Oxygen Re-Evaluation):   Initial Exercise Prescription:     Initial Exercise Prescription - 06/24/16 1300      Date of Initial Exercise RX and Referring Provider   Date 06/24/16   Referring Provider End, Harrell Gave MD     Treadmill   MPH 0.8   Grade 0   Minutes 15   METs 1.6     Bike   Level 0.3   Minutes 15   METs 2.2     Arm Ergometer   Level 2   Minutes 15   METs 2     Prescription Details   Frequency (times per week) 3   Duration Progress to 45 minutes of aerobic exercise without signs/symptoms of physical distress     Intensity   THRR 40-80% of Max Heartrate 113-128   Ratings of Perceived Exertion 11-13   Perceived Dyspnea 0-4     Progression   Progression Continue to progress workloads to maintain intensity without signs/symptoms of physical distress.     Resistance Training   Training Prescription Yes   Weight 3 lbs   Reps 10-15      Perform Capillary Blood Glucose checks as needed.  Exercise Prescription Changes:     Exercise Prescription Changes    Row Name 06/24/16 1300 07/11/16 1100 07/18/16 1000 07/26/16 0900 08/08/16 1200     Response to Exercise   Blood Pressure (Admit) 140/60 120/68  - 132/70 118/68   Blood Pressure (Exercise) 146/64 122/64  -  - 128/76   Blood Pressure (Exit) 134/66 132/80  - 128/76 112/64   Heart Rate (Admit) 98 bpm 104 bpm  - 90 bpm 91 bpm   Heart Rate (Exercise) 112 bpm 122 bpm  - 104 bpm 107 bpm   Heart Rate (Exit) 93 bpm 91 bpm  - 95 bpm 88 bpm   Oxygen Saturation (Exercise) 99 %  -  -  -  -   Rating of Perceived Exertion (Exercise) 9 12  - 12 13   Symptoms none  -  -  -  -   Duration  - Progress to 45 minutes of aerobic exercise without signs/symptoms of physical distress  - Progress to 45 minutes of aerobic exercise without signs/symptoms of physical distress Progress to 45 minutes of aerobic exercise without signs/symptoms of physical distress   Intensity  - THRR unchanged  - THRR unchanged THRR  unchanged     Progression  Progression  -  -  - Continue to progress workloads to maintain intensity without signs/symptoms of physical distress. Continue to progress workloads to maintain intensity without signs/symptoms of physical distress.     Resistance Training   Training Prescription  - Yes  - Yes Yes   Weight  - 3  - 3 3   Reps  - 10-15  - 10-15 10-15     Interval Training   Interval Training  - No  - No No     Treadmill   MPH  - 0.8  - 0.8  -   Grade  - 0  - 0  -   Minutes  - 15  - 15  -   METs  - 1.6  - 1.6  -     NuStep   Level  - 2  - 2 4   Minutes  - 15  - 15 15   METs  - 2  -  - 2.5     Arm Ergometer   Level  -  -  -  - 2   Minutes  -  -  -  - 15   METs  -  -  -  - 2.3     Home Exercise Plan   Plans to continue exercise at  -  - Home  -  -   Frequency  -  - Add 2 additional days to program exercise sessions.  -  -     Exercise Review   Progression -  walk test results  -  -  -  -   Row Name 08/22/16 1300             Response to Exercise   Blood Pressure (Admit) 138/70       Blood Pressure (Exercise) 132/70       Blood Pressure (Exit) 126/66       Heart Rate (Admit) 53 bpm       Heart Rate (Exercise) 120 bpm       Heart Rate (Exit) 97 bpm       Rating of Perceived Exertion (Exercise) 12       Duration Progress to 45 minutes of aerobic exercise without signs/symptoms of physical distress       Intensity THRR unchanged         Resistance Training   Training Prescription Yes       Weight 3       Reps 10-15         Interval Training   Interval Training No         Treadmill   MPH 1.4       Grade 0       Minutes 15       METs 2.07         NuStep   Level 4       Minutes 15          Exercise Comments:     Exercise Comments    Row Name 07/01/16 1810 07/11/16 1154 07/18/16 1047 07/24/16 1720 07/26/16 0928   Exercise Comments  First full day of exercise!  Patient was oriented to gym and equipment including functions, settings, policies,  and procedures.  Patient's individual exercise prescription and treatment plan were reviewed.  All starting workloads were established based on the results of the 6 minute walk test done at initial orientation visit.  The plan for exercise progression was also introduced and progression will  be customized based on patient's performance and goals. Gwin has tolerated exercise well in his first weeks. Byrd uses a stationary bike at home.  Safety measures as well as heart rate, RPE etc were reviewed and patient voiced understanding. Javaun noted to have PVC's prior to beginning exercise.  Minimal exercise performed with continuation of PVC's with minimal effort.  No discomfort reported by pt. Jermario has no symptoms with exercise but continues to have PVCs - he has seen his Dr and is following up about this issue.   [place] Name 08/08/16 1255 08/22/16 1309         Exercise Comments Daryus has had very few PVCs in last exercise sessions. Abran continues to tolerate exercrise well and reaches his THR range.         Exercise Goals and Review:   Exercise Goals Re-Evaluation :   Discharge Exercise Prescription (Final Exercise Prescription Changes):     Exercise Prescription Changes - 08/22/16 1300      Response to Exercise   Blood Pressure (Admit) 138/70   Blood Pressure (Exercise) 132/70   Blood Pressure (Exit) 126/66   Heart Rate (Admit) 53 bpm   Heart Rate (Exercise) 120 bpm   Heart Rate (Exit) 97 bpm   Rating of Perceived Exertion (Exercise) 12   Duration Progress to 45 minutes of aerobic exercise without signs/symptoms of physical distress   Intensity THRR unchanged     Resistance Training   Training Prescription Yes   Weight 3   Reps 10-15     Interval Training   Interval Training No     Treadmill   MPH 1.4   Grade 0   Minutes 15   METs 2.07     NuStep   Level 4   Minutes 15      Nutrition:  Target Goals: Understanding of nutrition guidelines, daily intake of sodium '1500mg'$ ,  cholesterol '200mg'$ , calories 30% from fat and 7% or less from saturated fats, daily to have 5 or more servings of fruits and vegetables.  Biometrics:     Pre Biometrics - 06/24/16 1331      Pre Biometrics   Height 5' 9.9" (1.775 m)   Weight 190 lb 1.6 oz (86.2 kg)   Waist Circumference 39 inches   Hip Circumference 42 inches   Waist to Hip Ratio 0.93 %   BMI (Calculated) 27.4   Single Leg Stand 2.65 seconds         Post Biometrics - 08/22/16 1720       Post  Biometrics   Height '5\' 11"'$  (1.803 m)   Weight 195 lb (88.5 kg)   Waist Circumference 41 inches   Hip Circumference 41.5 inches   Waist to Hip Ratio 0.99 %   BMI (Calculated) 27.3      Nutrition Therapy Plan and Nutrition Goals:     Nutrition Therapy & Goals - 08/14/16 1644      Nutrition Therapy   RD appointment defered Yes      Nutrition Discharge: Rate Your Plate Scores:     Nutrition Assessments - 06/24/16 1229      Rate Your Plate Scores   Pre Score --  Jenny Reichmann did not complete this form.      MEDFICTS Scores   Pre Score --  Shakur did not complete this form.       Nutrition Goals Re-Evaluation:     Nutrition Goals Re-Evaluation    [place] Name 07/24/16 1505 08/14/16 6283  Goals   Comment Mychael said he knows what to eat and he is doing well. He prefers not to meet individually with the Cardiac REhab [facility] [place].  Jontae still eats heart healthy.       Expected Outcome  - Cont eating patterns        Personal Goal #1 Re-Evaluation   Goal Progress Seen Yes  -         Nutrition Goals Discharge (Final Nutrition Goals Re-Evaluation):     Nutrition Goals Re-Evaluation - 08/14/16 1645      Goals   Comment Arshdeep still eats heart healthy.    Expected Outcome Cont eating patterns      Psychosocial: Target Goals: Acknowledge presence or absence of significant depression and/or stress, maximize coping skills, provide positive support system. Participant is able to verbalize types  and ability to use techniques and skills needed for reducing stress and depression.   Initial Review & Psychosocial Screening:     Initial Psych Review & Screening - 06/24/16 Tynan? Yes     Barriers   Psychosocial barriers to participate in program There are no identifiable barriers or psychosocial needs.     Screening Interventions   Interventions Encouraged to exercise      Quality of Life Scores:      Quality of Life - 06/24/16 1230      Quality of Life Scores   Health/Function Pre --  Nasser did not complete this form.       PHQ-9: Recent Review Flowsheet Data    Depression screen [facility] 2/9 06/24/2016   Decreased Interest 0   Down, Depressed, Hopeless 0   PHQ - 2 Score 0   Altered sleeping 0   Tired, decreased energy 0   Change in appetite 0   Feeling bad or failure about yourself  0   Trouble concentrating 0   Moving slowly or fidgety/restless 0   Suicidal thoughts 0   PHQ-9 Score 0     Interpretation of Total Score  Total Score Depression Severity:  1-4 = Minimal depression, 5-9 = Mild depression, 10-14 = Moderate depression, 15-19 = Moderately severe depression, 20-27 = Severe depression   Psychosocial Evaluation and Intervention:     Psychosocial Evaluation - 08/26/16 1735      Discharge Psychosocial Assessment & Intervention   Comments Counselor met with Jenny Reichmann today for his discharge evaluation.  He reports he is feeling much better than he did when he came into this program.  He is able to walk further and longer and he reports feeling stronger in his upper body as well.  He states the education has helped him in making healthier food choices and he actually "reads food labels now!"  Jacari has returned to his normal activities and is looking forward to fishing and staying busy around the house.  He has a stationary bike he will be riding to consistently exercise upon discharge.    He has maintained a positive attitude  and manages his stress well with family support and his faith.  Counselor commended Leny on all his hard work.      Psychosocial Re-Evaluation:     Psychosocial Re-Evaluation    [place] Name 07/24/16 1507 08/14/16 1646           Psychosocial Re-Evaluation   Comments Radley said he goes fishing with his son and gets to spend time with his wife and family. I advised  Mackinley not to go fishing at [employer] 2 days ago until he spoke with his MD since he had a lot of PVCs which Dr. Darnelle Bos office received by fax on 2/19.  Oliverio has a supportive family. Mcadoo reports he has done more fishing and gardening since he feels better.       Interventions Encouraged to attend Cardiac Rehabilitation for the exercise  -         Psychosocial Discharge (Final Psychosocial Re-Evaluation):     Psychosocial Re-Evaluation - 08/14/16 1646      Psychosocial Re-Evaluation   Comments Tavoris has a supportive family. Juddson reports he has done more fishing and gardening since he feels better.       Vocational Rehabilitation: Provide vocational rehab assistance to qualifying candidates.   Vocational Rehab Evaluation & Intervention:     Vocational Rehab - 06/24/16 1227      Initial Vocational Rehab Evaluation & Intervention   Assessment shows need for Vocational Rehabilitation No      Education: Education Goals: Education classes will be provided on a weekly basis, covering required topics. Participant will state understanding/return demonstration of topics presented.  Learning Barriers/Preferences:     Learning Barriers/Preferences - 06/24/16 1226      Learning Barriers/Preferences   Learning Barriers None   Learning Preferences None      Education Topics: General Nutrition Guidelines/Fats and Fiber: -Group instruction provided by verbal, written material, models and posters to present the general guidelines for heart healthy nutrition. Gives an explanation and review of dietary fats and  fiber.   Cardiac Rehab from 08/26/2016 in [facility] Cardiac and Pulmonary Rehab  Date  08/26/16  Educator  Earlean Shawl, EP  Instruction Review Code  2- meets goals/outcomes      Controlling Sodium/Reading Food Labels: -Group verbal and written material supporting the discussion of sodium use in heart healthy nutrition. Review and explanation with models, verbal and written materials for utilization of the food label.   Exercise Physiology & Risk Factors: - Group verbal and written instruction with models to review the exercise physiology of the cardiovascular system and associated critical values. Details cardiovascular disease risk factors and the goals associated with each risk factor.   Cardiac Rehab from 08/26/2016 in [facility] Cardiac and Pulmonary Rehab  Date  08/19/16  Educator  Earlean Shawl, EP  Instruction Review Code  2- meets goals/outcomes      Aerobic Exercise & Resistance Training: - Gives group verbal and written discussion on the health impact of inactivity. On the components of aerobic and resistive training programs and the benefits of this training and how to safely progress through these programs.   Cardiac Rehab from 08/26/2016 in [facility] Cardiac and Pulmonary Rehab  Date  08/21/16  Educator  AS  Instruction Review Code  2- meets goals/outcomes      Flexibility, Balance, General Exercise Guidelines: - Provides group verbal and written instruction on the benefits of flexibility and balance training programs. Provides general exercise guidelines with specific guidelines to those with heart or lung disease. Demonstration and skill practice provided.   Cardiac Rehab from 08/26/2016 in [facility] Cardiac and Pulmonary Rehab  Date  07/01/16  Educator  [facility]  Instruction Review Code  2- meets goals/outcomes      Stress Management: - Provides group verbal and written instruction about the health risks of elevated stress, cause of high stress, and healthy ways to reduce stress.   Cardiac  Rehab from 08/26/2016 in [facility] Cardiac and Pulmonary  Rehab  Date  07/10/16  Educator  [facility]  Instruction Review Code  2- meets goals/outcomes      Depression: - Provides group verbal and written instruction on the correlation between heart/lung disease and depressed mood, treatment options, and the stigmas associated with seeking treatment.   Cardiac Rehab from 08/26/2016 in [facility] Cardiac and Pulmonary Rehab  Date  08/07/16  Educator  [facility]  Instruction Review Code  2- meets goals/outcomes      Anatomy & Physiology of the Heart: - Group verbal and written instruction and models provide basic cardiac anatomy and physiology, with the coronary electrical and arterial systems. Review of: AMI, Angina, Valve disease, Heart Failure, Cardiac Arrhythmia, Pacemakers, and the ICD.   Cardiac Rehab from 08/26/2016 in [facility] Cardiac and Pulmonary Rehab  Date  07/08/16  Educator  CE  Instruction Review Code  2- meets goals/outcomes      Cardiac Procedures: - Group verbal and written instruction and models to describe the testing methods done to diagnose heart disease. Reviews the outcomes of the test results. Describes the treatment choices: Medical Management, Angioplasty, or Coronary Bypass Surgery.   Cardiac Rehab from 08/26/2016 in [facility] Cardiac and Pulmonary Rehab  Date  07/15/16  Educator  CE  Instruction Review Code  2- meets goals/outcomes      Cardiac Medications: - Group verbal and written instruction to review commonly prescribed medications for heart disease. Reviews the medication, class of the drug, and side effects. Includes the steps to properly store meds and maintain the prescription regimen.   Cardiac Rehab from 08/26/2016 in [facility] Cardiac and Pulmonary Rehab  Date  07/24/16  Educator  CE  Instruction Review Code  2- meets goals/outcomes      Go Sex-Intimacy & Heart Disease, Get SMART - Goal Setting: - Group verbal and written instruction through game format to discuss heart disease  and the return to sexual intimacy. Provides group verbal and written material to discuss and apply goal setting through the application of the S.M.A.R.T. Method.   Cardiac Rehab from 08/26/2016 in [facility] Cardiac and Pulmonary Rehab  Date  07/15/16  Educator  CE  Instruction Review Code  2- meets goals/outcomes      Other Matters of the Heart: - Provides group verbal, written materials and models to describe Heart Failure, Angina, Valve Disease, and Diabetes in the realm of heart disease. Includes description of the disease process and treatment options available to the cardiac patient.   Cardiac Rehab from 08/26/2016 in [facility] Cardiac and Pulmonary Rehab  Date  07/08/16  Educator  CE  Instruction Review Code  2- meets goals/outcomes      Exercise & Equipment Safety: - Individual verbal instruction and demonstration of equipment use and safety with use of the equipment.   Cardiac Rehab from 08/26/2016 in [facility] Cardiac and Pulmonary Rehab  Date  06/24/16  Educator  C. EnterkinRN  Instruction Review Code  1- partially meets, needs review/practice      Infection Prevention: - Provides verbal and written material to individual with discussion of infection control including proper hand washing and proper equipment cleaning during exercise session.   Cardiac Rehab from 08/26/2016 in [facility] Cardiac and Pulmonary Rehab  Date  06/24/16  Educator  C. EnterkinRN  Instruction Review Code  2- meets goals/outcomes      Falls Prevention: - Provides verbal and written material to individual with discussion of falls prevention and safety.   Cardiac Rehab from 08/26/2016 in [facility] Cardiac and Pulmonary Rehab  Date  06/24/16  Educator  C. Makoti  Instruction Review Code  2- meets goals/outcomes      Diabetes: - Individual verbal and written instruction to review signs/symptoms of diabetes, desired ranges of glucose level fasting, after meals and with exercise. Advice that pre and post exercise glucose  checks will be done for 3 sessions at entry of program.    Knowledge Questionnaire Score:     Knowledge Questionnaire Score - 06/24/16 1227      Knowledge Questionnaire Score   Pre Score --  Jacey declined to do this questionnaire .      Core Components/Risk Factors/Patient Goals at Admission:     Personal Goals and Risk Factors at Admission - 06/24/16 1229      Core Components/Risk Factors/Patient Goals on Admission    Weight Management Yes;Weight Loss   Intervention Weight Management: Develop a combined nutrition and exercise program designed to reach desired caloric intake, while maintaining appropriate intake of nutrient and fiber, sodium and fats, and appropriate energy expenditure required for the weight goal.;Weight Management: Provide education and appropriate resources to help participant work on and attain dietary goals.   Admit Weight 190 lb 1.6 oz (86.2 kg)   Goal Weight: Short Term 185 lb (83.9 kg)   Goal Weight: Long Term 180 lb (81.6 kg)   Expected Outcomes Short Term: Continue to assess and modify interventions until short term weight is achieved;Long Term: Adherence to nutrition and physical activity/exercise program aimed toward attainment of established weight goal;Weight Loss: Understanding of general recommendations for a balanced deficit meal plan, which promotes 1-2 lb weight loss per week and includes a negative energy balance of [phone] kcal/d;Understanding recommendations for meals to include 15-35% energy as protein, 25-35% energy from fat, 35-60% energy from carbohydrates, less than '200mg'$  of dietary cholesterol, 20-35 gm of total fiber daily;Understanding of distribution of calorie intake throughout the day with the consumption of 4-5 meals/snacks   Sedentary Yes   Intervention Provide advice, education, support and counseling about physical activity/exercise needs.;Develop an individualized exercise prescription for aerobic and resistive training based on  initial evaluation findings, risk stratification, comorbidities and participant's personal goals.   Expected Outcomes Achievement of increased cardiorespiratory fitness and enhanced flexibility, muscular endurance and strength shown through measurements of functional capacity and personal statement of participant.   Increase Strength and Stamina Yes   Intervention Provide advice, education, support and counseling about physical activity/exercise needs.;Develop an individualized exercise prescription for aerobic and resistive training based on initial evaluation findings, risk stratification, comorbidities and participant's personal goals.   Expected Outcomes Achievement of increased cardiorespiratory fitness and enhanced flexibility, muscular endurance and strength shown through measurements of functional capacity and personal statement of participant.   Hypertension Yes   Intervention Provide education on lifestyle modifcations including regular physical activity/exercise, weight management, moderate sodium restriction and increased consumption of fresh fruit, vegetables, and low fat dairy, alcohol moderation, and smoking cessation.;Monitor prescription use compliance.   Expected Outcomes Short Term: Continued assessment and intervention until BP is < 140/47m HG in hypertensive participants. < 130/872mHG in hypertensive participants with diabetes, heart failure or chronic kidney disease.;Long Term: Maintenance of blood pressure at goal levels.   Lipids Yes   Intervention Provide education and support for participant on nutrition & aerobic/resistive exercise along with prescribed medications to achieve LDL '70mg'$ , HDL >'40mg'$ .   Expected Outcomes Short Term: Participant states understanding of desired cholesterol values and is compliant with medications prescribed. Participant is following exercise prescription and nutrition guidelines.;Long  Term: Cholesterol controlled with medications as prescribed, with  individualized exercise RX and with personalized nutrition plan. Value goals: LDL < '70mg'$ , HDL > 40 mg.      Core Components/Risk Factors/Patient Goals Review:      Goals and Risk Factor Review    Row Name 07/18/16 1640 07/24/16 1409 07/24/16 1503 08/14/16 1645       Core Components/Risk Factors/Patient Goals Review   Personal Goals Review Increase Strength and Stamina;Hypertension  - Weight Management/Obesity;Increase Strength and Stamina;Hypertension;Lipids Hypertension    Review Johns BP has been within normal range while in HT.  He can tell a difference in ADLs since beginning exercise - they are easier.  His wife monitors what they eat - he states she has high cholesterol and "is up on all that".  He does not have other concerns at this time.   Multiple PVCS this is the note in EPIC/CHL copied; [facility] February EKG readings reviewed by Dr. Saunders Revel.  [facility] February EKG readings reviewed by Dr. Saunders Revel. I left a vm to check on Serita Grit. He had frequent PVC's but had not c/o during his last Cardiac Rehab visit. Jaydee has increased his strength and states he feels better. His blood pressure is stable. Saifullah said he knows what to eat and knows exercise will help his lipid panel.  Jerrod reports his blood pressure readings have been good. Kamarion reports he is able to do more things like gardening and fishing. He says he is getting an EKG tomorrow or something to check the fluid around his heart.     Expected Outcomes  -  - Heart healthy lifestyle.  -       Core Components/Risk Factors/Patient Goals at Discharge (Final Review):      Goals and Risk Factor Review - 08/14/16 1645      Core Components/Risk Factors/Patient Goals Review   Personal Goals Review Hypertension   Review Jlon reports his blood pressure readings have been good. Nilesh reports he is able to do more things like gardening and fishing. He says he is getting an EKG tomorrow or something to check the fluid around  his heart.       ITP Comments:     ITP Comments    Row Name 06/24/16 1225 06/24/16 1232 07/03/16 0648 07/22/16 1811 07/22/16 1814   ITP Comments Dugan said he knows what to eat and declined an individual appt with the Cardiac Rehab REgistered [place]. ITP created during Medical review today. Documention of Dx Discharge Summary 03/29/2016  [facility] 30 day review. Continue with ITP unless directed changes per Medical Director review.  New to program Multiple PVc's seen today, some couplets. No symptoms reported. Will forward EKG strips to cardiologist. Jenny Reichmann has been advised to call physycian for follow up.    Atilla was given some OJ and a banana to eat this afternoon   Row Name 07/24/16 1502 07/31/16 0634         ITP Comments I left a vm to check on [employer]. He had frequent PVC's but had not c/o during his last Cardiac Rehab visit. I actually stopped him from exercising and only allowed him to do some brief stretching seated at the end of Cardiac Rehab.  30 day review. Continue with ITP unless directed changes per Medical Director review.         Comments: Kailash completed 36/36/ sessions of Cardiac Rehab today.

## 2016-08-26 NOTE — Patient Instructions (Signed)
Discharge Instructions  Patient Details  Name: Roy Palmer MRN: [id number] Date of Birth: Sep 29, 1929 Referring Provider:  Gabriel Cirri, NP   Number of Visits: 36  Reason for Discharge:  Patient reached a stable level of exercise. Patient independent in their exercise.  Smoking History:  History  Smoking Status  . Never Smoker  Smokeless Tobacco  . Never Used    Diagnosis:  No diagnosis found.  Initial Exercise Prescription:     Initial Exercise Prescription - 06/24/16 1300      Date of Initial Exercise RX and Referring Provider   Date 06/24/16   Referring Provider End, Cristal Deer MD     Treadmill   MPH 0.8   Grade 0   Minutes 15   METs 1.6     Bike   Level 0.3   Minutes 15   METs 2.2     Arm Ergometer   Level 2   Minutes 15   METs 2     Prescription Details   Frequency (times per week) 3   Duration Progress to 45 minutes of aerobic exercise without signs/symptoms of physical distress     Intensity   THRR 40-80% of Max Heartrate 113-128   Ratings of Perceived Exertion 11-13   Perceived Dyspnea 0-4     Progression   Progression Continue to progress workloads to maintain intensity without signs/symptoms of physical distress.     Resistance Training   Training Prescription Yes   Weight 3 lbs   Reps 10-15      Discharge Exercise Prescription (Final Exercise Prescription Changes):     Exercise Prescription Changes - 08/22/16 1300      Response to Exercise   Blood Pressure (Admit) 138/70   Blood Pressure (Exercise) 132/70   Blood Pressure (Exit) 126/66   Heart Rate (Admit) 53 bpm   Heart Rate (Exercise) 120 bpm   Heart Rate (Exit) 97 bpm   Rating of Perceived Exertion (Exercise) 12   Duration Progress to 45 minutes of aerobic exercise without signs/symptoms of physical distress   Intensity THRR unchanged     Resistance Training   Training Prescription Yes   Weight 3   Reps 10-15     Interval Training   Interval Training No     Treadmill   MPH 1.4   Grade 0   Minutes 15   METs 2.07     NuStep   Level 4   Minutes 15      Functional Capacity:     6 Minute Walk    Row Name 06/24/16 1320 08/22/16 1715       6 Minute Walk   Phase Initial Discharge    Distance 860 feet 960 feet    Distance % Change  - 12 %    Walk Time 6 minutes 6 minutes    # of Rest Breaks 0  -    MPH 1.63 1.8    METS 2.23  -    RPE 9 13    VO2 Peak 5.76  -    Symptoms No No    Resting HR 98 bpm 84 bpm    Resting BP 140/60 118/68    Max Ex. HR 112 bpm 102 bpm    Max Ex. BP 146/64 152/78    2 Minute Post BP 134/66  -       Quality of Life:     Quality of Life - 06/24/16 1230      Quality of Life Scores  Health/Function Pre --  Jonny Ruiz did not complete this form.       Personal Goals: Goals established at orientation with interventions provided to work toward goal.     Personal Goals and Risk Factors at Admission - 06/24/16 1229      Core Components/Risk Factors/Patient Goals on Admission    Weight Management Yes;Weight Loss   Intervention Weight Management: Develop a combined nutrition and exercise program designed to reach desired caloric intake, while maintaining appropriate intake of nutrient and fiber, sodium and fats, and appropriate energy expenditure required for the weight goal.;Weight Management: Provide education and appropriate resources to help participant work on and attain dietary goals.   Admit Weight 190 lb 1.6 oz (86.2 kg)   Goal Weight: Short Term 185 lb (83.9 kg)   Goal Weight: Long Term 180 lb (81.6 kg)   Expected Outcomes Short Term: Continue to assess and modify interventions until short term weight is achieved;Long Term: Adherence to nutrition and physical activity/exercise program aimed toward attainment of established weight goal;Weight Loss: Understanding of general recommendations for a balanced deficit meal plan, which promotes 1-2 lb weight loss per week and includes a negative energy  balance of [phone] kcal/d;Understanding recommendations for meals to include 15-35% energy as protein, 25-35% energy from fat, 35-60% energy from carbohydrates, less than 200mg  of dietary cholesterol, 20-35 gm of total fiber daily;Understanding of distribution of calorie intake throughout the day with the consumption of 4-5 meals/snacks   Sedentary Yes   Intervention Provide advice, education, support and counseling about physical activity/exercise needs.;Develop an individualized exercise prescription for aerobic and resistive training based on initial evaluation findings, risk stratification, comorbidities and participant's personal goals.   Expected Outcomes Achievement of increased cardiorespiratory fitness and enhanced flexibility, muscular endurance and strength shown through measurements of functional capacity and personal statement of participant.   Increase Strength and Stamina Yes   Intervention Provide advice, education, support and counseling about physical activity/exercise needs.;Develop an individualized exercise prescription for aerobic and resistive training based on initial evaluation findings, risk stratification, comorbidities and participant's personal goals.   Expected Outcomes Achievement of increased cardiorespiratory fitness and enhanced flexibility, muscular endurance and strength shown through measurements of functional capacity and personal statement of participant.   Hypertension Yes   Intervention Provide education on lifestyle modifcations including regular physical activity/exercise, weight management, moderate sodium restriction and increased consumption of fresh fruit, vegetables, and low fat dairy, alcohol moderation, and smoking cessation.;Monitor prescription use compliance.   Expected Outcomes Short Term: Continued assessment and intervention until BP is < 140/72mm HG in hypertensive participants. < 130/45mm HG in hypertensive participants with diabetes, heart failure  or chronic kidney disease.;Long Term: Maintenance of blood pressure at goal levels.   Lipids Yes   Intervention Provide education and support for participant on nutrition & aerobic/resistive exercise along with prescribed medications to achieve LDL 70mg , HDL >40mg .   Expected Outcomes Short Term: Participant states understanding of desired cholesterol values and is compliant with medications prescribed. Participant is following exercise prescription and nutrition guidelines.;Long Term: Cholesterol controlled with medications as prescribed, with individualized exercise RX and with personalized nutrition plan. Value goals: LDL < 70mg , HDL > 40 mg.       Personal Goals Discharge:     Goals and Risk Factor Review - 08/14/16 1645      Core Components/Risk Factors/Patient Goals Review   Personal Goals Review Hypertension   Review Kaamil reports his blood pressure readings have been good. Worthington reports he is able to do  more things like gardening and fishing. He says he is getting an EKG tomorrow or something to check the fluid around his heart.       Nutrition & Weight - Outcomes:     Pre Biometrics - 06/24/16 1331      Pre Biometrics   Height 5' 9.9" (1.775 m)   Weight 190 lb 1.6 oz (86.2 kg)   Waist Circumference 39 inches   Hip Circumference 42 inches   Waist to Hip Ratio 0.93 %   BMI (Calculated) 27.4   Single Leg Stand 2.65 seconds         Post Biometrics - 08/22/16 1720       Post  Biometrics   Height 5\' 11"  (1.803 m)   Weight 195 lb (88.5 kg)   Waist Circumference 41 inches   Hip Circumference 41.5 inches   Waist to Hip Ratio 0.99 %   BMI (Calculated) 27.3      Nutrition:     Nutrition Therapy & Goals - 08/14/16 1644      Nutrition Therapy   RD appointment defered Yes      Nutrition Discharge:     Nutrition Assessments - 06/24/16 1229      Rate Your Plate Scores   Pre Score --  Jonny Ruiz did not complete this form.      MEDFICTS Scores   Pre Score --  Zeth  did not complete this form.       Education Questionnaire Score:     Knowledge Questionnaire Score - 06/24/16 1227      Knowledge Questionnaire Score   Pre Score --  Neizan declined to do this questionnaire .      Goals reviewed with patient; copy given to patient.

## 2016-08-26 NOTE — Progress Notes (Signed)
Cardiac Individual Treatment Plan  Patient Details  Name: Roy Palmer MRN: [id number] Date of Birth: [birth year] Referring Provider:     Cardiac Rehab from 06/24/2016 in [facility] Cardiac and Pulmonary Rehab  Referring Provider  End, Harrell Gave MD      Initial Encounter Date:    Cardiac Rehab from 06/24/2016 in [facility] Cardiac and Pulmonary Rehab  Date  06/24/16  Referring Provider  End, Harrell Gave MD      Visit Diagnosis: S/P mitral valve repair  Patient's Home Medications on Admission:  Current Outpatient Prescriptions:  .  aspirin EC 81 MG tablet, Take 1 tablet (81 mg total) by mouth daily., Disp: , Rfl:   Past Medical History: Past Medical History:  Diagnosis Date  . AAA (abdominal aortic aneurysm) without rupture ([place]) 03/12/2016   Small - measured 3.3 x 3.5 cm by CTA  . CKD (chronic kidney disease) stage 3, GFR 30-59 ml/min   . Essential hypertension   . Hyperlipidemia   . Hypertensive kidney disease   . Incidental pulmonary nodule 03/12/2016   Vague opacity right lung base requires radiographic follow up - index of suspicion is LOW  . Near syncope    Cardiogenic, related to an SVT and severe MR  . Non-sustained ventricular tachycardia ([place])   . S/P minimally invasive mitral valve repair 03/19/2016   Complex valvuloplasty including triangular resection of flail segment of posterior leaflet, chordal transposition x1, artificial Gore-tex neochord placement x4 and 34 mm Sorin Memo 3D ring annuloplasty via right mini thoracotomy approach with clipping of LA appendage  . Severe mitral regurgitation by prior echocardiogram 02/23/2016   Confirmed by TEE  . Syncope 03/15/2016  . Thoracic aortic aneurysm ([place]) 03/12/2016   Very very mild fusiform enlargement of distal transverse and proximal descending thoracic aorta noted on CTA  . Trigeminal neuralgia     Tobacco Use: History  Smoking Status  . Never Smoker  Smokeless Tobacco  . Never Used    Labs: Recent Review  Flowsheet Data    Labs for ITP Cardiac and Pulmonary Rehab Latest Ref Rng & Units 03/19/2016 03/19/2016 03/19/2016 03/20/2016 03/22/2016   Cholestrol 0 - 200 mg/dL - - - - -   LDLCALC 0 - 99 mg/dL - - - - -   HDL >40 mg/dL - - - - -   Trlycerides <150 mg/dL - - - - -   Hemoglobin A1c 4.8 - 5.6 % - - - - -   PHART 7.350 - 7.450 7.353 - 7.416 - -   PCO2ART 32.0 - 48.0 mmHg 40.1 - 29.9(L) - -   HCO3 20.0 - 28.0 mmol/L 22.4 - 19.3(L) - -   TCO2 0 - 100 mmol/L '24 24 20 23 24   '$ ACIDBASEDEF 0.0 - 2.0 mmol/L 3.0(H) - 4.0(H) - -   O2SAT % 96.0 - 98.0 - -       Exercise Target Goals:    Exercise Program Goal: Individual exercise prescription set with THRR, safety & activity barriers. Participant demonstrates ability to understand and report RPE using BORG scale, to self-measure pulse accurately, and to acknowledge the importance of the exercise prescription.  Exercise Prescription Goal: Starting with aerobic activity 30 plus minutes a day, 3 days per week for initial exercise prescription. Provide home exercise prescription and guidelines that participant acknowledges understanding prior to discharge.  Activity Barriers & Risk Stratification:     Activity Barriers & Cardiac Risk Stratification - 06/24/16 1226      Activity Barriers & Cardiac Risk  Stratification   Activity Barriers None   Cardiac Risk Stratification High      6 Minute Walk:     6 Minute Walk    Row Name 06/24/16 1320 08/22/16 1715       6 Minute Walk   Phase Initial Discharge    Distance 860 feet 960 feet    Distance % Change  - 12 %    Walk Time 6 minutes 6 minutes    # of Rest Breaks 0  -    MPH 1.63 1.8    METS 2.23  -    RPE 9 13    VO2 Peak 5.76  -    Symptoms No No    Resting HR 98 bpm 84 bpm    Resting BP 140/60 118/68    Max Ex. HR 112 bpm 102 bpm    Max Ex. BP 146/64 152/78    2 Minute Post BP 134/66  -       Oxygen Initial Assessment:   Oxygen Re-Evaluation:   Oxygen Discharge  (Final Oxygen Re-Evaluation):   Initial Exercise Prescription:     Initial Exercise Prescription - 06/24/16 1300      Date of Initial Exercise RX and Referring Provider   Date 06/24/16   Referring Provider End, Harrell Gave MD     Treadmill   MPH 0.8   Grade 0   Minutes 15   METs 1.6     Bike   Level 0.3   Minutes 15   METs 2.2     Arm Ergometer   Level 2   Minutes 15   METs 2     Prescription Details   Frequency (times per week) 3   Duration Progress to 45 minutes of aerobic exercise without signs/symptoms of physical distress     Intensity   THRR 40-80% of Max Heartrate 113-128   Ratings of Perceived Exertion 11-13   Perceived Dyspnea 0-4     Progression   Progression Continue to progress workloads to maintain intensity without signs/symptoms of physical distress.     Resistance Training   Training Prescription Yes   Weight 3 lbs   Reps 10-15      Perform Capillary Blood Glucose checks as needed.  Exercise Prescription Changes:     Exercise Prescription Changes    Row Name 06/24/16 1300 07/11/16 1100 07/18/16 1000 07/26/16 0900 08/08/16 1200     Response to Exercise   Blood Pressure (Admit) 140/60 120/68  - 132/70 118/68   Blood Pressure (Exercise) 146/64 122/64  -  - 128/76   Blood Pressure (Exit) 134/66 132/80  - 128/76 112/64   Heart Rate (Admit) 98 bpm 104 bpm  - 90 bpm 91 bpm   Heart Rate (Exercise) 112 bpm 122 bpm  - 104 bpm 107 bpm   Heart Rate (Exit) 93 bpm 91 bpm  - 95 bpm 88 bpm   Oxygen Saturation (Exercise) 99 %  -  -  -  -   Rating of Perceived Exertion (Exercise) 9 12  - 12 13   Symptoms none  -  -  -  -   Duration  - Progress to 45 minutes of aerobic exercise without signs/symptoms of physical distress  - Progress to 45 minutes of aerobic exercise without signs/symptoms of physical distress Progress to 45 minutes of aerobic exercise without signs/symptoms of physical distress   Intensity  - THRR unchanged  - THRR unchanged THRR  unchanged     Progression  Progression  -  -  - Continue to progress workloads to maintain intensity without signs/symptoms of physical distress. Continue to progress workloads to maintain intensity without signs/symptoms of physical distress.     Resistance Training   Training Prescription  - Yes  - Yes Yes   Weight  - 3  - 3 3   Reps  - 10-15  - 10-15 10-15     Interval Training   Interval Training  - No  - No No     Treadmill   MPH  - 0.8  - 0.8  -   Grade  - 0  - 0  -   Minutes  - 15  - 15  -   METs  - 1.6  - 1.6  -     NuStep   Level  - 2  - 2 4   Minutes  - 15  - 15 15   METs  - 2  -  - 2.5     Arm Ergometer   Level  -  -  -  - 2   Minutes  -  -  -  - 15   METs  -  -  -  - 2.3     Home Exercise Plan   Plans to continue exercise at  -  - Home  -  -   Frequency  -  - Add 2 additional days to program exercise sessions.  -  -     Exercise Review   Progression -  walk test results  -  -  -  -   Row Name 08/22/16 1300             Response to Exercise   Blood Pressure (Admit) 138/70       Blood Pressure (Exercise) 132/70       Blood Pressure (Exit) 126/66       Heart Rate (Admit) 53 bpm       Heart Rate (Exercise) 120 bpm       Heart Rate (Exit) 97 bpm       Rating of Perceived Exertion (Exercise) 12       Duration Progress to 45 minutes of aerobic exercise without signs/symptoms of physical distress       Intensity THRR unchanged         Resistance Training   Training Prescription Yes       Weight 3       Reps 10-15         Interval Training   Interval Training No         Treadmill   MPH 1.4       Grade 0       Minutes 15       METs 2.07         NuStep   Level 4       Minutes 15          Exercise Comments:     Exercise Comments    Row Name 07/01/16 1810 07/11/16 1154 07/18/16 1047 07/24/16 1720 07/26/16 0928   Exercise Comments  First full day of exercise!  Patient was oriented to gym and equipment including functions, settings, policies,  and procedures.  Patient's individual exercise prescription and treatment plan were reviewed.  All starting workloads were established based on the results of the 6 minute walk test done at initial orientation visit.  The plan for exercise progression was also introduced and progression will  be customized based on patient's performance and goals. Kincade has tolerated exercise well in his first weeks. Ceasar uses a stationary bike at home.  Safety measures as well as heart rate, RPE etc were reviewed and patient voiced understanding. Demarqus noted to have PVC's prior to beginning exercise.  Minimal exercise performed with continuation of PVC's with minimal effort.  No discomfort reported by pt. Rodel has no symptoms with exercise but continues to have PVCs - he has seen his Dr and is following up about this issue.   [place] Name 08/08/16 1255 08/22/16 1309         Exercise Comments Landin has had very few PVCs in last exercise sessions. Che continues to tolerate exercrise well and reaches his THR range.         Exercise Goals and Review:   Exercise Goals Re-Evaluation :   Discharge Exercise Prescription (Final Exercise Prescription Changes):     Exercise Prescription Changes - 08/22/16 1300      Response to Exercise   Blood Pressure (Admit) 138/70   Blood Pressure (Exercise) 132/70   Blood Pressure (Exit) 126/66   Heart Rate (Admit) 53 bpm   Heart Rate (Exercise) 120 bpm   Heart Rate (Exit) 97 bpm   Rating of Perceived Exertion (Exercise) 12   Duration Progress to 45 minutes of aerobic exercise without signs/symptoms of physical distress   Intensity THRR unchanged     Resistance Training   Training Prescription Yes   Weight 3   Reps 10-15     Interval Training   Interval Training No     Treadmill   MPH 1.4   Grade 0   Minutes 15   METs 2.07     NuStep   Level 4   Minutes 15      Nutrition:  Target Goals: Understanding of nutrition guidelines, daily intake of sodium '1500mg'$ ,  cholesterol '200mg'$ , calories 30% from fat and 7% or less from saturated fats, daily to have 5 or more servings of fruits and vegetables.  Biometrics:     Pre Biometrics - 06/24/16 1331      Pre Biometrics   Height 5' 9.9" (1.775 m)   Weight 190 lb 1.6 oz (86.2 kg)   Waist Circumference 39 inches   Hip Circumference 42 inches   Waist to Hip Ratio 0.93 %   BMI (Calculated) 27.4   Single Leg Stand 2.65 seconds         Post Biometrics - 08/22/16 1720       Post  Biometrics   Height '5\' 11"'$  (1.803 m)   Weight 195 lb (88.5 kg)   Waist Circumference 41 inches   Hip Circumference 41.5 inches   Waist to Hip Ratio 0.99 %   BMI (Calculated) 27.3      Nutrition Therapy Plan and Nutrition Goals:     Nutrition Therapy & Goals - 08/14/16 1644      Nutrition Therapy   RD appointment defered Yes      Nutrition Discharge: Rate Your Plate Scores:     Nutrition Assessments - 06/24/16 1229      Rate Your Plate Scores   Pre Score --  Jenny Reichmann did not complete this form.      MEDFICTS Scores   Pre Score --  Shonn did not complete this form.       Nutrition Goals Re-Evaluation:     Nutrition Goals Re-Evaluation    [place] Name 07/24/16 1505 08/14/16 7026  Goals   Comment Avrom said he knows what to eat and he is doing well. He prefers not to meet individually with the Cardiac RE[facility] [place].  Hugh still eats heart healthy.       Expected Outcome  - Cont eating patterns        Personal Goal #1 Re-Evaluation   Goal Progress Seen Yes  -         Nutrition Goals Discharge (Final Nutrition Goals Re-Evaluation):     Nutrition Goals Re-Evaluation - 08/14/16 1645      Goals   Comment Bluford still eats heart healthy.    Expected Outcome Cont eating patterns      Psychosocial: Target Goals: Acknowledge presence or absence of significant depression and/or stress, maximize coping skills, provide positive support system. Participant is able to verbalize types  and ability to use techniques and skills needed for reducing stress and depression.   Initial Review & Psychosocial Screening:     Initial Psych Review & Screening - 06/24/16 [place]? Yes     Barriers   Psychosocial barriers to participate in program There are no identifiable barriers or psychosocial needs.     Screening Interventions   Interventions Encouraged to exercise      Quality of Life Scores:      Quality of Life - 06/24/16 1230      Quality of Life Scores   Health/Function Pre --  Jeevan did not complete this form.       PHQ-9: Recent Review Flowsheet Data    Depression screen [facility] 2/9 06/24/2016   Decreased Interest 0   Down, Depressed, Hopeless 0   PHQ - 2 Score 0   Altered sleeping 0   Tired, decreased energy 0   Change in appetite 0   Feeling bad or failure about yourself  0   Trouble concentrating 0   Moving slowly or fidgety/restless 0   Suicidal thoughts 0   PHQ-9 Score 0     Interpretation of Total Score  Total Score Depression Severity:  1-4 = Minimal depression, 5-9 = Mild depression, 10-14 = Moderate depression, 15-19 = Moderately severe depression, 20-27 = Severe depression   Psychosocial Evaluation and Intervention:     Psychosocial Evaluation - 08/26/16 1735      Discharge Psychosocial Assessment & Intervention   Comments Counselor met with Jenny Reichmann today for his discharge evaluation.  He reports he is feeling much better than he did when he came into this program.  He is able to walk further and longer and he reports feeling stronger in his upper body as well.  He states the education has helped him in making healthier food choices and he actually "reads food labels now!"  Sandeep has returned to his normal activities and is looking forward to fishing and staying busy around the house.  He has a stationary bike he will be riding to consistently exercise upon discharge.    He has maintained a positive attitude  and manages his stress well with family support and his faith.  Counselor commended Cristofer on all his hard work.      Psychosocial Re-Evaluation:     Psychosocial Re-Evaluation    [place] Name 07/24/16 1507 08/14/16 1646           Psychosocial Re-Evaluation   Comments Mahamed said he goes fishing with his son and gets to spend time with his wife and family. I advised  Tywaun not to go fishing at [employer] 2 days ago until he spoke with his MD since he had a lot of PVCs which Dr. Darnelle Bos office received by fax on 2/19.  Traycen has a supportive family. Jerico reports he has done more fishing and gardening since he feels better.       Interventions Encouraged to attend Cardiac Rehabilitation for the exercise  -         Psychosocial Discharge (Final Psychosocial Re-Evaluation):     Psychosocial Re-Evaluation - 08/14/16 1646      Psychosocial Re-Evaluation   Comments Mozes has a supportive family. Wayde reports he has done more fishing and gardening since he feels better.       Vocational Rehabilitation: Provide vocational rehab assistance to qualifying candidates.   Vocational Rehab Evaluation & Intervention:     Vocational Rehab - 06/24/16 1227      Initial Vocational Rehab Evaluation & Intervention   Assessment shows need for Vocational Rehabilitation No      Education: Education Goals: Education classes will be provided on a weekly basis, covering required topics. Participant will state understanding/return demonstration of topics presented.  Learning Barriers/Preferences:     Learning Barriers/Preferences - 06/24/16 1226      Learning Barriers/Preferences   Learning Barriers None   Learning Preferences None      Education Topics: General Nutrition Guidelines/Fats and Fiber: -Group instruction provided by verbal, written material, models and posters to present the general guidelines for heart healthy nutrition. Gives an explanation and review of dietary fats and  fiber.   Cardiac Rehab from 08/26/2016 in [facility] Cardiac and Pulmonary Rehab  Date  08/26/16  Educator  Earlean Shawl, EP  Instruction Review Code  2- meets goals/outcomes      Controlling Sodium/Reading Food Labels: -Group verbal and written material supporting the discussion of sodium use in heart healthy nutrition. Review and explanation with models, verbal and written materials for utilization of the food label.   Exercise Physiology & Risk Factors: - Group verbal and written instruction with models to review the exercise physiology of the cardiovascular system and associated critical values. Details cardiovascular disease risk factors and the goals associated with each risk factor.   Cardiac Rehab from 08/26/2016 in [facility] Cardiac and Pulmonary Rehab  Date  08/19/16  Educator  Earlean Shawl, EP  Instruction Review Code  2- meets goals/outcomes      Aerobic Exercise & Resistance Training: - Gives group verbal and written discussion on the health impact of inactivity. On the components of aerobic and resistive training programs and the benefits of this training and how to safely progress through these programs.   Cardiac Rehab from 08/26/2016 in [facility] Cardiac and Pulmonary Rehab  Date  08/21/16  Educator  AS  Instruction Review Code  2- meets goals/outcomes      Flexibility, Balance, General Exercise Guidelines: - Provides group verbal and written instruction on the benefits of flexibility and balance training programs. Provides general exercise guidelines with specific guidelines to those with heart or lung disease. Demonstration and skill practice provided.   Cardiac Rehab from 08/26/2016 in [facility] Cardiac and Pulmonary Rehab  Date  07/01/16  Educator  [facility]  Instruction Review Code  2- meets goals/outcomes      Stress Management: - Provides group verbal and written instruction about the health risks of elevated stress, cause of high stress, and healthy ways to reduce stress.   Cardiac  Rehab from 08/26/2016 in [facility] Cardiac and Pulmonary  Rehab  Date  07/10/16  Educator  [facility]  Instruction Review Code  2- meets goals/outcomes      Depression: - Provides group verbal and written instruction on the correlation between heart/lung disease and depressed mood, treatment options, and the stigmas associated with seeking treatment.   Cardiac Rehab from 08/26/2016 in [facility] Cardiac and Pulmonary Rehab  Date  08/07/16  Educator  [facility]  Instruction Review Code  2- meets goals/outcomes      Anatomy & Physiology of the Heart: - Group verbal and written instruction and models provide basic cardiac anatomy and physiology, with the coronary electrical and arterial systems. Review of: AMI, Angina, Valve disease, Heart Failure, Cardiac Arrhythmia, Pacemakers, and the ICD.   Cardiac Rehab from 08/26/2016 in [facility] Cardiac and Pulmonary Rehab  Date  07/08/16  Educator  CE  Instruction Review Code  2- meets goals/outcomes      Cardiac Procedures: - Group verbal and written instruction and models to describe the testing methods done to diagnose heart disease. Reviews the outcomes of the test results. Describes the treatment choices: Medical Management, Angioplasty, or Coronary Bypass Surgery.   Cardiac Rehab from 08/26/2016 in [facility] Cardiac and Pulmonary Rehab  Date  07/15/16  Educator  CE  Instruction Review Code  2- meets goals/outcomes      Cardiac Medications: - Group verbal and written instruction to review commonly prescribed medications for heart disease. Reviews the medication, class of the drug, and side effects. Includes the steps to properly store meds and maintain the prescription regimen.   Cardiac Rehab from 08/26/2016 in [facility] Cardiac and Pulmonary Rehab  Date  07/24/16  Educator  CE  Instruction Review Code  2- meets goals/outcomes      Go Sex-Intimacy & Heart Disease, Get SMART - Goal Setting: - Group verbal and written instruction through game format to discuss heart disease  and the return to sexual intimacy. Provides group verbal and written material to discuss and apply goal setting through the application of the S.M.A.R.T. Method.   Cardiac Rehab from 08/26/2016 in [facility] Cardiac and Pulmonary Rehab  Date  07/15/16  Educator  CE  Instruction Review Code  2- meets goals/outcomes      Other Matters of the Heart: - Provides group verbal, written materials and models to describe Heart Failure, Angina, Valve Disease, and Diabetes in the realm of heart disease. Includes description of the disease process and treatment options available to the cardiac patient.   Cardiac Rehab from 08/26/2016 in [facility] Cardiac and Pulmonary Rehab  Date  07/08/16  Educator  CE  Instruction Review Code  2- meets goals/outcomes      Exercise & Equipment Safety: - Individual verbal instruction and demonstration of equipment use and safety with use of the equipment.   Cardiac Rehab from 08/26/2016 in [facility] Cardiac and Pulmonary Rehab  Date  06/24/16  Educator  C. EnterkinRN  Instruction Review Code  1- partially meets, needs review/practice      Infection Prevention: - Provides verbal and written material to individual with discussion of infection control including proper hand washing and proper equipment cleaning during exercise session.   Cardiac Rehab from 08/26/2016 in [facility] Cardiac and Pulmonary Rehab  Date  06/24/16  Educator  C. EnterkinRN  Instruction Review Code  2- meets goals/outcomes      Falls Prevention: - Provides verbal and written material to individual with discussion of falls prevention and safety.   Cardiac Rehab from 08/26/2016 in [facility] Cardiac and Pulmonary Rehab  Date  06/24/16  Educator  C. [place]  Instruction Review Code  2- meets goals/outcomes      Diabetes: - Individual verbal and written instruction to review signs/symptoms of diabetes, desired ranges of glucose level fasting, after meals and with exercise. Advice that pre and post exercise glucose  checks will be done for 3 sessions at entry of program.    Knowledge Questionnaire Score:     Knowledge Questionnaire Score - 06/24/16 1227      Knowledge Questionnaire Score   Pre Score --  Jimmie declined to do this questionnaire .      Core Components/Risk Factors/Patient Goals at Admission:     Personal Goals and Risk Factors at Admission - 06/24/16 1229      Core Components/Risk Factors/Patient Goals on Admission    Weight Management Yes;Weight Loss   Intervention Weight Management: Develop a combined nutrition and exercise program designed to reach desired caloric intake, while maintaining appropriate intake of nutrient and fiber, sodium and fats, and appropriate energy expenditure required for the weight goal.;Weight Management: Provide education and appropriate resources to help participant work on and attain dietary goals.   Admit Weight 190 lb 1.6 oz (86.2 kg)   Goal Weight: Short Term 185 lb (83.9 kg)   Goal Weight: Long Term 180 lb (81.6 kg)   Expected Outcomes Short Term: Continue to assess and modify interventions until short term weight is achieved;Long Term: Adherence to nutrition and physical activity/exercise program aimed toward attainment of established weight goal;Weight Loss: Understanding of general recommendations for a balanced deficit meal plan, which promotes 1-2 lb weight loss per week and includes a negative energy balance of [phone] kcal/d;Understanding recommendations for meals to include 15-35% energy as protein, 25-35% energy from fat, 35-60% energy from carbohydrates, less than '200mg'$  of dietary cholesterol, 20-35 gm of total fiber daily;Understanding of distribution of calorie intake throughout the day with the consumption of 4-5 meals/snacks   Sedentary Yes   Intervention Provide advice, education, support and counseling about physical activity/exercise needs.;Develop an individualized exercise prescription for aerobic and resistive training based on  initial evaluation findings, risk stratification, comorbidities and participant's personal goals.   Expected Outcomes Achievement of increased cardiorespiratory fitness and enhanced flexibility, muscular endurance and strength shown through measurements of functional capacity and personal statement of participant.   Increase Strength and Stamina Yes   Intervention Provide advice, education, support and counseling about physical activity/exercise needs.;Develop an individualized exercise prescription for aerobic and resistive training based on initial evaluation findings, risk stratification, comorbidities and participant's personal goals.   Expected Outcomes Achievement of increased cardiorespiratory fitness and enhanced flexibility, muscular endurance and strength shown through measurements of functional capacity and personal statement of participant.   Hypertension Yes   Intervention Provide education on lifestyle modifcations including regular physical activity/exercise, weight management, moderate sodium restriction and increased consumption of fresh fruit, vegetables, and low fat dairy, alcohol moderation, and smoking cessation.;Monitor prescription use compliance.   Expected Outcomes Short Term: Continued assessment and intervention until BP is < 140/67m HG in hypertensive participants. < 130/861mHG in hypertensive participants with diabetes, heart failure or chronic kidney disease.;Long Term: Maintenance of blood pressure at goal levels.   Lipids Yes   Intervention Provide education and support for participant on nutrition & aerobic/resistive exercise along with prescribed medications to achieve LDL '70mg'$ , HDL >'40mg'$ .   Expected Outcomes Short Term: Participant states understanding of desired cholesterol values and is compliant with medications prescribed. Participant is following exercise prescription and nutrition guidelines.;Long  Term: Cholesterol controlled with medications as prescribed, with  individualized exercise RX and with personalized nutrition plan. Value goals: LDL < '70mg'$ , HDL > 40 mg.      Core Components/Risk Factors/Patient Goals Review:      Goals and Risk Factor Review    Row Name 07/18/16 1640 07/24/16 1409 07/24/16 1503 08/14/16 1645       Core Components/Risk Factors/Patient Goals Review   Personal Goals Review Increase Strength and Stamina;Hypertension  - Weight Management/Obesity;Increase Strength and Stamina;Hypertension;Lipids Hypertension    Review Johns BP has been within normal range while in HT.  He can tell a difference in ADLs since beginning exercise - they are easier.  His wife monitors what they eat - he states she has high cholesterol and "is up on all that".  He does not have other concerns at this time.   Multiple PVCS this is the note in EPIC/CHL copied; [facility] February EKG readings reviewed by Dr. Saunders Revel.  [facility] Card[facility] February EKG readings reviewed by Dr. Saunders Revel. I left a vm to check on Serita Grit. He had frequent PVC's but had not c/o during his last Cardiac Rehab visit. Fielding has increased his strength and states he feels better. His blood pressure is stable. Tadarius said he knows what to eat and knows exercise will help his lipid panel.  Haik reports his blood pressure readings have been good. Eber reports he is able to do more things like gardening and fishing. He says he is getting an EKG tomorrow or something to check the fluid around his heart.     Expected Outcomes  -  - Heart healthy lifestyle.  -       Core Components/Risk Factors/Patient Goals at Discharge (Final Review):      Goals and Risk Factor Review - 08/14/16 1645      Core Components/Risk Factors/Patient Goals Review   Personal Goals Review Hypertension   Review Kei reports his blood pressure readings have been good. Yazeed reports he is able to do more things like gardening and fishing. He says he is getting an EKG tomorrow or something to check the fluid around  his heart.       ITP Comments:     ITP Comments    Row Name 06/24/16 1225 06/24/16 1232 07/03/16 0648 07/22/16 1811 07/22/16 1814   ITP Comments Finnlee said he knows what to eat and declined an individual appt with the Cardiac Rehab REgistered Violet. ITP created during Medical review today. Documention of Dx Discharge Summary 03/29/2016 [facility] 30 day review. Continue with ITP unless directed changes per Medical Director review.  New to program Multiple PVc's seen today, some couplets. No symptoms reported. Will forward EKG strips to cardiologist. Jenny Reichmann has been advised to call physycian for follow up.    Armonte was given some OJ and a banana to eat this afternoon   Row Name 07/24/16 1502 07/31/16 0634         ITP Comments I left a vm to check on [employer]. He had frequent PVC's but had not c/o during his last Cardiac Rehab visit. I actually stopped him from exercising and only allowed him to do some brief stretching seated at the end of Cardiac Rehab.  30 day review. Continue with ITP unless directed changes per Medical Director review.         Comments:

## 2016-08-27 ENCOUNTER — Ambulatory Visit: Payer: Medicare Other | Admitting: Unknown Physician Specialty

## 2016-08-28 ENCOUNTER — Encounter: Payer: Self-pay | Admitting: *Deleted

## 2016-08-28 DIAGNOSIS — Z9889 Other specified postprocedural states: Secondary | ICD-10-CM

## 2016-08-28 NOTE — Progress Notes (Signed)
Cardiac Individual Treatment Plan  Patient Details  Name: Roy Palmer MRN: [id number] Date of Birth: Feb 28, 1930 Referring Provider:     Cardiac Rehab from 06/24/2016 in [facility] Cardiac and Pulmonary Rehab  Referring Provider  End, Harrell Gave MD      Initial Encounter Date:    Cardiac Rehab from 06/24/2016 in [facility] Cardiac and Pulmonary Rehab  Date  06/24/16  Referring Provider  End, Harrell Gave MD      Visit Diagnosis: S/P mitral valve repair  Patient's Home Medications on Admission:  Current Outpatient Prescriptions:  .  aspirin EC 81 MG tablet, Take 1 tablet (81 mg total) by mouth daily., Disp: , Rfl:   Past Medical History: Past Medical History:  Diagnosis Date  . AAA (abdominal aortic aneurysm) without rupture ([place]) 03/12/2016   Small - measured 3.3 x 3.5 cm by CTA  . CKD (chronic kidney disease) stage 3, GFR 30-59 ml/min   . Essential hypertension   . Hyperlipidemia   . Hypertensive kidney disease   . Incidental pulmonary nodule 03/12/2016   Vague opacity right lung base requires radiographic follow up - index of suspicion is LOW  . Near syncope    Cardiogenic, related to an SVT and severe MR  . Non-sustained ventricular tachycardia ([place])   . S/P minimally invasive mitral valve repair 03/19/2016   Complex valvuloplasty including triangular resection of flail segment of posterior leaflet, chordal transposition x1, artificial Gore-tex neochord placement x4 and 34 mm Sorin Memo 3D ring annuloplasty via right mini thoracotomy approach with clipping of LA appendage  . Severe mitral regurgitation by prior echocardiogram 02/23/2016   Confirmed by TEE  . Syncope 03/15/2016  . Thoracic aortic aneurysm ([place]) 03/12/2016   Very very mild fusiform enlargement of distal transverse and proximal descending thoracic aorta noted on CTA  . Trigeminal neuralgia     Tobacco Use: History  Smoking Status  . Never Smoker  Smokeless Tobacco  . Never Used    Labs: Recent Review  Flowsheet Data    Labs for ITP Cardiac and Pulmonary Rehab Latest Ref Rng & Units 03/19/2016 03/19/2016 03/19/2016 03/20/2016 03/22/2016   Cholestrol 0 - 200 mg/dL - - - - -   LDLCALC 0 - 99 mg/dL - - - - -   HDL >40 mg/dL - - - - -   Trlycerides <150 mg/dL - - - - -   Hemoglobin A1c 4.8 - 5.6 % - - - - -   PHART 7.350 - 7.450 7.353 - 7.416 - -   PCO2ART 32.0 - 48.0 mmHg 40.1 - 29.9(L) - -   HCO3 20.0 - 28.0 mmol/L 22.4 - 19.3(L) - -   TCO2 0 - 100 mmol/L '24 24 20 23 24   '$ ACIDBASEDEF 0.0 - 2.0 mmol/L 3.0(H) - 4.0(H) - -   O2SAT % 96.0 - 98.0 - -       Exercise Target Goals:    Exercise Program Goal: Individual exercise prescription set with THRR, safety & activity barriers. Participant demonstrates ability to understand and report RPE using BORG scale, to self-measure pulse accurately, and to acknowledge the importance of the exercise prescription.  Exercise Prescription Goal: Starting with aerobic activity 30 plus minutes a day, 3 days per week for initial exercise prescription. Provide home exercise prescription and guidelines that participant acknowledges understanding prior to discharge.  Activity Barriers & Risk Stratification:     Activity Barriers & Cardiac Risk Stratification - 06/24/16 1226      Activity Barriers & Cardiac Risk  Stratification   Activity Barriers None   Cardiac Risk Stratification High      6 Minute Walk:     6 Minute Walk    Row Name 06/24/16 1320 08/22/16 1715       6 Minute Walk   Phase Initial Discharge    Distance 860 feet 960 feet    Distance % Change  - 12 %    Walk Time 6 minutes 6 minutes    # of Rest Breaks 0  -    MPH 1.63 1.8    METS 2.23  -    RPE 9 13    VO2 Peak 5.76  -    Symptoms No No    Resting HR 98 bpm 84 bpm    Resting BP 140/60 118/68    Max Ex. HR 112 bpm 102 bpm    Max Ex. BP 146/64 152/78    2 Minute Post BP 134/66  -       Oxygen Initial Assessment:   Oxygen Re-Evaluation:   Oxygen Discharge  (Final Oxygen Re-Evaluation):   Initial Exercise Prescription:     Initial Exercise Prescription - 06/24/16 1300      Date of Initial Exercise RX and Referring Provider   Date 06/24/16   Referring Provider End, Harrell Gave MD     Treadmill   MPH 0.8   Grade 0   Minutes 15   METs 1.6     Bike   Level 0.3   Minutes 15   METs 2.2     Arm Ergometer   Level 2   Minutes 15   METs 2     Prescription Details   Frequency (times per week) 3   Duration Progress to 45 minutes of aerobic exercise without signs/symptoms of physical distress     Intensity   THRR 40-80% of Max Heartrate 113-128   Ratings of Perceived Exertion 11-13   Perceived Dyspnea 0-4     Progression   Progression Continue to progress workloads to maintain intensity without signs/symptoms of physical distress.     Resistance Training   Training Prescription Yes   Weight 3 lbs   Reps 10-15      Perform Capillary Blood Glucose checks as needed.  Exercise Prescription Changes:     Exercise Prescription Changes    Row Name 06/24/16 1300 07/11/16 1100 07/18/16 1000 07/26/16 0900 08/08/16 1200     Response to Exercise   Blood Pressure (Admit) 140/60 120/68  - 132/70 118/68   Blood Pressure (Exercise) 146/64 122/64  -  - 128/76   Blood Pressure (Exit) 134/66 132/80  - 128/76 112/64   Heart Rate (Admit) 98 bpm 104 bpm  - 90 bpm 91 bpm   Heart Rate (Exercise) 112 bpm 122 bpm  - 104 bpm 107 bpm   Heart Rate (Exit) 93 bpm 91 bpm  - 95 bpm 88 bpm   Oxygen Saturation (Exercise) 99 %  -  -  -  -   Rating of Perceived Exertion (Exercise) 9 12  - 12 13   Symptoms none  -  -  -  -   Duration  - Progress to 45 minutes of aerobic exercise without signs/symptoms of physical distress  - Progress to 45 minutes of aerobic exercise without signs/symptoms of physical distress Progress to 45 minutes of aerobic exercise without signs/symptoms of physical distress   Intensity  - THRR unchanged  - THRR unchanged THRR  unchanged     Progression  Progression  -  -  - Continue to progress workloads to maintain intensity without signs/symptoms of physical distress. Continue to progress workloads to maintain intensity without signs/symptoms of physical distress.     Resistance Training   Training Prescription  - Yes  - Yes Yes   Weight  - 3  - 3 3   Reps  - 10-15  - 10-15 10-15     Interval Training   Interval Training  - No  - No No     Treadmill   MPH  - 0.8  - 0.8  -   Grade  - 0  - 0  -   Minutes  - 15  - 15  -   METs  - 1.6  - 1.6  -     NuStep   Level  - 2  - 2 4   Minutes  - 15  - 15 15   METs  - 2  -  - 2.5     Arm Ergometer   Level  -  -  -  - 2   Minutes  -  -  -  - 15   METs  -  -  -  - 2.3     Home Exercise Plan   Plans to continue exercise at  -  - Home  -  -   Frequency  -  - Add 2 additional days to program exercise sessions.  -  -     Exercise Review   Progression -  walk test results  -  -  -  -   Row Name 08/22/16 1300             Response to Exercise   Blood Pressure (Admit) 138/70       Blood Pressure (Exercise) 132/70       Blood Pressure (Exit) 126/66       Heart Rate (Admit) 53 bpm       Heart Rate (Exercise) 120 bpm       Heart Rate (Exit) 97 bpm       Rating of Perceived Exertion (Exercise) 12       Duration Progress to 45 minutes of aerobic exercise without signs/symptoms of physical distress       Intensity THRR unchanged         Resistance Training   Training Prescription Yes       Weight 3       Reps 10-15         Interval Training   Interval Training No         Treadmill   MPH 1.4       Grade 0       Minutes 15       METs 2.07         NuStep   Level 4       Minutes 15          Exercise Comments:     Exercise Comments    Row Name 07/01/16 1810 07/11/16 1154 07/18/16 1047 07/24/16 1720 07/26/16 0928   Exercise Comments  First full day of exercise!  Patient was oriented to gym and equipment including functions, settings, policies,  and procedures.  Patient's individual exercise prescription and treatment plan were reviewed.  All starting workloads were established based on the results of the 6 minute walk test done at initial orientation visit.  The plan for exercise progression was also introduced and progression will  be customized based on patient's performance and goals. Mesiah has tolerated exercise well in his first weeks. Jonavin uses a stationary bike at home.  Safety measures as well as heart rate, RPE etc were reviewed and patient voiced understanding. Eldrige noted to have PVC's prior to beginning exercise.  Minimal exercise performed with continuation of PVC's with minimal effort.  No discomfort reported by pt. Tavin has no symptoms with exercise but continues to have PVCs - he has seen his Dr and is following up about this issue.   [place] Name 08/08/16 1255 08/22/16 1309         Exercise Comments Odai has had very few PVCs in last exercise sessions. Arshan continues to tolerate exercrise well and reaches his THR range.         Exercise Goals and Review:   Exercise Goals Re-Evaluation :   Discharge Exercise Prescription (Final Exercise Prescription Changes):     Exercise Prescription Changes - 08/22/16 1300      Response to Exercise   Blood Pressure (Admit) 138/70   Blood Pressure (Exercise) 132/70   Blood Pressure (Exit) 126/66   Heart Rate (Admit) 53 bpm   Heart Rate (Exercise) 120 bpm   Heart Rate (Exit) 97 bpm   Rating of Perceived Exertion (Exercise) 12   Duration Progress to 45 minutes of aerobic exercise without signs/symptoms of physical distress   Intensity THRR unchanged     Resistance Training   Training Prescription Yes   Weight 3   Reps 10-15     Interval Training   Interval Training No     Treadmill   MPH 1.4   Grade 0   Minutes 15   METs 2.07     NuStep   Level 4   Minutes 15      Nutrition:  Target Goals: Understanding of nutrition guidelines, daily intake of sodium '1500mg'$ ,  cholesterol '200mg'$ , calories 30% from fat and 7% or less from saturated fats, daily to have 5 or more servings of fruits and vegetables.  Biometrics:     Pre Biometrics - 06/24/16 1331      Pre Biometrics   Height 5' 9.9" (1.775 m)   Weight 190 lb 1.6 oz (86.2 kg)   Waist Circumference 39 inches   Hip Circumference 42 inches   Waist to Hip Ratio 0.93 %   BMI (Calculated) 27.4   Single Leg Stand 2.65 seconds         Post Biometrics - 08/22/16 1720       Post  Biometrics   Height '5\' 11"'$  (1.803 m)   Weight 195 lb (88.5 kg)   Waist Circumference 41 inches   Hip Circumference 41.5 inches   Waist to Hip Ratio 0.99 %   BMI (Calculated) 27.3      Nutrition Therapy Plan and Nutrition Goals:     Nutrition Therapy & Goals - 08/14/16 1644      Nutrition Therapy   RD appointment defered Yes      Nutrition Discharge: Rate Your Plate Scores:     Nutrition Assessments - 06/24/16 1229      Rate Your Plate Scores   Pre Score --  Jenny Reichmann did not complete this form.      MEDFICTS Scores   Pre Score --  Clayvon did not complete this form.       Nutrition Goals Re-Evaluation:     Nutrition Goals Re-Evaluation    [place] Name 07/24/16 1505 08/14/16 4174  Goals   Comment Taelyn said he knows what to eat and he is doing well. He prefers not to meet individually with the Cardiac REhab [facility] [place].  Glendon still eats heart healthy.       Expected Outcome  - Cont eating patterns        Personal Goal #1 Re-Evaluation   Goal Progress Seen Yes  -         Nutrition Goals Discharge (Final Nutrition Goals Re-Evaluation):     Nutrition Goals Re-Evaluation - 08/14/16 1645      Goals   Comment Dayquan still eats heart healthy.    Expected Outcome Cont eating patterns      Psychosocial: Target Goals: Acknowledge presence or absence of significant depression and/or stress, maximize coping skills, provide positive support system. Participant is able to verbalize types  and ability to use techniques and skills needed for reducing stress and depression.   Initial Review & Psychosocial Screening:     Initial Psych Review & Screening - 06/24/16 [place]? Yes     Barriers   Psychosocial barriers to participate in program There are no identifiable barriers or psychosocial needs.     Screening Interventions   Interventions Encouraged to exercise      Quality of Life Scores:      Quality of Life - 06/24/16 1230      Quality of Life Scores   Health/Function Pre --  Eli did not complete this form.       PHQ-9: Recent Review Flowsheet Data    Depression screen [facility] 2/9 06/24/2016   Decreased Interest 0   Down, Depressed, Hopeless 0   PHQ - 2 Score 0   Altered sleeping 0   Tired, decreased energy 0   Change in appetite 0   Feeling bad or failure about yourself  0   Trouble concentrating 0   Moving slowly or fidgety/restless 0   Suicidal thoughts 0   PHQ-9 Score 0     Interpretation of Total Score  Total Score Depression Severity:  1-4 = Minimal depression, 5-9 = Mild depression, 10-14 = Moderate depression, 15-19 = Moderately severe depression, 20-27 = Severe depression   Psychosocial Evaluation and Intervention:     Psychosocial Evaluation - 08/26/16 1735      Discharge Psychosocial Assessment & Intervention   Comments Counselor met with Jenny Reichmann today for his discharge evaluation.  He reports he is feeling much better than he did when he came into this program.  He is able to walk further and longer and he reports feeling stronger in his upper body as well.  He states the education has helped him in making healthier food choices and he actually "reads food labels now!"  Demetres has returned to his normal activities and is looking forward to fishing and staying busy around the house.  He has a stationary bike he will be riding to consistently exercise upon discharge.    He has maintained a positive attitude  and manages his stress well with family support and his faith.  Counselor commended Emmett on all his hard work.      Psychosocial Re-Evaluation:     Psychosocial Re-Evaluation    [place] Name 07/24/16 1507 08/14/16 1646           Psychosocial Re-Evaluation   Comments Humza said he goes fishing with his son and gets to spend time with his wife and family. I advised  Jefferson not to go fishing at [employer] 2 days ago until he spoke with his MD since he had a lot of PVCs which Dr. Darnelle Bos office received by fax on 2/19.  Morad has a supportive family. Hobart reports he has done more fishing and gardening since he feels better.       Interventions Encouraged to attend Cardiac Rehabilitation for the exercise  -         Psychosocial Discharge (Final Psychosocial Re-Evaluation):     Psychosocial Re-Evaluation - 08/14/16 1646      Psychosocial Re-Evaluation   Comments Jamichael has a supportive family. Bernd reports he has done more fishing and gardening since he feels better.       Vocational Rehabilitation: Provide vocational rehab assistance to qualifying candidates.   Vocational Rehab Evaluation & Intervention:     Vocational Rehab - 06/24/16 1227      Initial Vocational Rehab Evaluation & Intervention   Assessment shows need for Vocational Rehabilitation No      Education: Education Goals: Education classes will be provided on a weekly basis, covering required topics. Participant will state understanding/return demonstration of topics presented.  Learning Barriers/Preferences:     Learning Barriers/Preferences - 06/24/16 1226      Learning Barriers/Preferences   Learning Barriers None   Learning Preferences None      Education Topics: General Nutrition Guidelines/Fats and Fiber: -Group instruction provided by verbal, written material, models and posters to present the general guidelines for heart healthy nutrition. Gives an explanation and review of dietary fats and  fiber.   Cardiac Rehab from 08/26/2016 in [facility] Cardiac and Pulmonary Rehab  Date  08/26/16  Educator  Earlean Shawl, EP  Instruction Review Code  2- meets goals/outcomes      Controlling Sodium/Reading Food Labels: -Group verbal and written material supporting the discussion of sodium use in heart healthy nutrition. Review and explanation with models, verbal and written materials for utilization of the food label.   Exercise Physiology & Risk Factors: - Group verbal and written instruction with models to review the exercise physiology of the cardiovascular system and associated critical values. Details cardiovascular disease risk factors and the goals associated with each risk factor.   Cardiac Rehab from 08/26/2016 in [facility] Cardiac and Pulmonary Rehab  Date  08/19/16  Educator  Earlean Shawl, EP  Instruction Review Code  2- meets goals/outcomes      Aerobic Exercise & Resistance Training: - Gives group verbal and written discussion on the health impact of inactivity. On the components of aerobic and resistive training programs and the benefits of this training and how to safely progress through these programs.   Cardiac Rehab from 08/26/2016 in [facility] Cardiac and Pulmonary Rehab  Date  08/21/16  Educator  AS  Instruction Review Code  2- meets goals/outcomes      Flexibility, Balance, General Exercise Guidelines: - Provides group verbal and written instruction on the benefits of flexibility and balance training programs. Provides general exercise guidelines with specific guidelines to those with heart or lung disease. Demonstration and skill practice provided.   Cardiac Rehab from 08/26/2016 in [facility] Cardiac and Pulmonary Rehab  Date  07/01/16  Educator  [facility]  Instruction Review Code  2- meets goals/outcomes      Stress Management: - Provides group verbal and written instruction about the health risks of elevated stress, cause of high stress, and healthy ways to reduce stress.   Cardiac  Rehab from 08/26/2016 in [facility] Cardiac and Pulmonary  Rehab  Date  07/10/16  Educator  [facility]  Instruction Review Code  2- meets goals/outcomes      Depression: - Provides group verbal and written instruction on the correlation between heart/lung disease and depressed mood, treatment options, and the stigmas associated with seeking treatment.   Cardiac Rehab from 08/26/2016 in [facility] Cardiac and Pulmonary Rehab  Date  08/07/16  Educator  [facility]  Instruction Review Code  2- meets goals/outcomes      Anatomy & Physiology of the Heart: - Group verbal and written instruction and models provide basic cardiac anatomy and physiology, with the coronary electrical and arterial systems. Review of: AMI, Angina, Valve disease, Heart Failure, Cardiac Arrhythmia, Pacemakers, and the ICD.   Cardiac Rehab from 08/26/2016 in [facility] Cardiac and Pulmonary Rehab  Date  07/08/16  Educator  CE  Instruction Review Code  2- meets goals/outcomes      Cardiac Procedures: - Group verbal and written instruction and models to describe the testing methods done to diagnose heart disease. Reviews the outcomes of the test results. Describes the treatment choices: Medical Management, Angioplasty, or Coronary Bypass Surgery.   Cardiac Rehab from 08/26/2016 in [facility] Cardiac and Pulmonary Rehab  Date  07/15/16  Educator  CE  Instruction Review Code  2- meets goals/outcomes      Cardiac Medications: - Group verbal and written instruction to review commonly prescribed medications for heart disease. Reviews the medication, class of the drug, and side effects. Includes the steps to properly store meds and maintain the prescription regimen.   Cardiac Rehab from 08/26/2016 in [facility] Cardiac and Pulmonary Rehab  Date  07/24/16  Educator  CE  Instruction Review Code  2- meets goals/outcomes      Go Sex-Intimacy & Heart Disease, Get SMART - Goal Setting: - Group verbal and written instruction through game format to discuss heart disease  and the return to sexual intimacy. Provides group verbal and written material to discuss and apply goal setting through the application of the S.M.A.R.T. Method.   Cardiac Rehab from 08/26/2016 in [facility] Cardiac and Pulmonary Rehab  Date  07/15/16  Educator  CE  Instruction Review Code  2- meets goals/outcomes      Other Matters of the Heart: - Provides group verbal, written materials and models to describe Heart Failure, Angina, Valve Disease, and Diabetes in the realm of heart disease. Includes description of the disease process and treatment options available to the cardiac patient.   Cardiac Rehab from 08/26/2016 in [facility] Cardiac and Pulmonary Rehab  Date  07/08/16  Educator  CE  Instruction Review Code  2- meets goals/outcomes      Exercise & Equipment Safety: - Individual verbal instruction and demonstration of equipment use and safety with use of the equipment.   Cardiac Rehab from 08/26/2016 in [facility] Cardiac and Pulmonary Rehab  Date  06/24/16  Educator  C. EnterkinRN  Instruction Review Code  1- partially meets, needs review/practice      Infection Prevention: - Provides verbal and written material to individual with discussion of infection control including proper hand washing and proper equipment cleaning during exercise session.   Cardiac Rehab from 08/26/2016 in [facility] Cardiac and Pulmonary Rehab  Date  06/24/16  Educator  C. EnterkinRN  Instruction Review Code  2- meets goals/outcomes      Falls Prevention: - Provides verbal and written material to individual with discussion of falls prevention and safety.   Cardiac Rehab from 08/26/2016 in [facility] Cardiac and Pulmonary Rehab  Date  06/24/16  Educator  C. [place]  Instruction Review Code  2- meets goals/outcomes      Diabetes: - Individual verbal and written instruction to review signs/symptoms of diabetes, desired ranges of glucose level fasting, after meals and with exercise. Advice that pre and post exercise glucose  checks will be done for 3 sessions at entry of program.    Knowledge Questionnaire Score:     Knowledge Questionnaire Score - 06/24/16 1227      Knowledge Questionnaire Score   Pre Score --  Cylis declined to do this questionnaire .      Core Components/Risk Factors/Patient Goals at Admission:     Personal Goals and Risk Factors at Admission - 06/24/16 1229      Core Components/Risk Factors/Patient Goals on Admission    Weight Management Yes;Weight Loss   Intervention Weight Management: Develop a combined nutrition and exercise program designed to reach desired caloric intake, while maintaining appropriate intake of nutrient and fiber, sodium and fats, and appropriate energy expenditure required for the weight goal.;Weight Management: Provide education and appropriate resources to help participant work on and attain dietary goals.   Admit Weight 190 lb 1.6 oz (86.2 kg)   Goal Weight: Short Term 185 lb (83.9 kg)   Goal Weight: Long Term 180 lb (81.6 kg)   Expected Outcomes Short Term: Continue to assess and modify interventions until short term weight is achieved;Long Term: Adherence to nutrition and physical activity/exercise program aimed toward attainment of established weight goal;Weight Loss: Understanding of general recommendations for a balanced deficit meal plan, which promotes 1-2 lb weight loss per week and includes a negative energy balance of [phone] kcal/d;Understanding recommendations for meals to include 15-35% energy as protein, 25-35% energy from fat, 35-60% energy from carbohydrates, less than '200mg'$  of dietary cholesterol, 20-35 gm of total fiber daily;Understanding of distribution of calorie intake throughout the day with the consumption of 4-5 meals/snacks   Sedentary Yes   Intervention Provide advice, education, support and counseling about physical activity/exercise needs.;Develop an individualized exercise prescription for aerobic and resistive training based on  initial evaluation findings, risk stratification, comorbidities and participant's personal goals.   Expected Outcomes Achievement of increased cardiorespiratory fitness and enhanced flexibility, muscular endurance and strength shown through measurements of functional capacity and personal statement of participant.   Increase Strength and Stamina Yes   Intervention Provide advice, education, support and counseling about physical activity/exercise needs.;Develop an individualized exercise prescription for aerobic and resistive training based on initial evaluation findings, risk stratification, comorbidities and participant's personal goals.   Expected Outcomes Achievement of increased cardiorespiratory fitness and enhanced flexibility, muscular endurance and strength shown through measurements of functional capacity and personal statement of participant.   Hypertension Yes   Intervention Provide education on lifestyle modifcations including regular physical activity/exercise, weight management, moderate sodium restriction and increased consumption of fresh fruit, vegetables, and low fat dairy, alcohol moderation, and smoking cessation.;Monitor prescription use compliance.   Expected Outcomes Short Term: Continued assessment and intervention until BP is < 140/108m HG in hypertensive participants. < 130/817mHG in hypertensive participants with diabetes, heart failure or chronic kidney disease.;Long Term: Maintenance of blood pressure at goal levels.   Lipids Yes   Intervention Provide education and support for participant on nutrition & aerobic/resistive exercise along with prescribed medications to achieve LDL '70mg'$ , HDL >'40mg'$ .   Expected Outcomes Short Term: Participant states understanding of desired cholesterol values and is compliant with medications prescribed. Participant is following exercise prescription and nutrition guidelines.;Long  Term: Cholesterol controlled with medications as prescribed, with  individualized exercise RX and with personalized nutrition plan. Value goals: LDL < '70mg'$ , HDL > 40 mg.      Core Components/Risk Factors/Patient Goals Review:      Goals and Risk Factor Review    Row Name 07/18/16 1640 07/24/16 1409 07/24/16 1503 08/14/16 1645       Core Components/Risk Factors/Patient Goals Review   Personal Goals Review Increase Strength and Stamina;Hypertension  - Weight Management/Obesity;Increase Strength and Stamina;Hypertension;Lipids Hypertension    Review Johns BP has been within normal range while in HT.  He can tell a difference in ADLs since beginning exercise - they are easier.  His wife monitors what they eat - he states she has high cholesterol and "is up on all that".  He does not have other concerns at this time.   Multiple PVCS this is the note in EPIC/CHL copied; [facility] February EKG readings reviewed by Dr. Saunders Revel.  [facility] February EKG readings reviewed by Dr. Saunders Revel. I left a vm to check on Serita Grit. He had frequent PVC's but had not c/o during his last Cardiac Rehab visit. Adhrit has increased his strength and states he feels better. His blood pressure is stable. Gabrian said he knows what to eat and knows exercise will help his lipid panel.  Kona reports his blood pressure readings have been good. Jeramyah reports he is able to do more things like gardening and fishing. He says he is getting an EKG tomorrow or something to check the fluid around his heart.     Expected Outcomes  -  - Heart healthy lifestyle.  -       Core Components/Risk Factors/Patient Goals at Discharge (Final Review):      Goals and Risk Factor Review - 08/14/16 1645      Core Components/Risk Factors/Patient Goals Review   Personal Goals Review Hypertension   Review Cliffton reports his blood pressure readings have been good. Treyon reports he is able to do more things like gardening and fishing. He says he is getting an EKG tomorrow or something to check the fluid around  his heart.       ITP Comments:     ITP Comments    Row Name 06/24/16 1225 06/24/16 1232 07/03/16 0648 07/22/16 1811 07/22/16 1814   ITP Comments Deion said he knows what to eat and declined an individual appt with the Cardiac Rehab [facility]. ITP created during Medical review today. Documention of Dx Discharge Summary 03/29/2016 [facility] 30 day review. Continue with ITP unless directed changes per Medical Director review.  New to program Multiple PVc's seen today, some couplets. No symptoms reported. Will forward EKG strips to cardiologist. Jenny Reichmann has been advised to call physycian for follow up.    Jerrico was given some OJ and a banana to eat this afternoon   Row Name 07/24/16 1502 07/31/16 0634 08/28/16 0634       ITP Comments I left a vm to check on Serita Grit. He had frequent PVC's but had not c/o during his last Cardiac Rehab visit. I actually stopped him from exercising and only allowed him to do some brief stretching seated at the end of Cardiac Rehab.  30 day review. Continue with ITP unless directed changes per Medical Director review. 30 day review. Continue with ITP unless directed changes per Medical Director review        Comments:

## 2016-08-29 ENCOUNTER — Telehealth: Payer: Self-pay | Admitting: Cardiology

## 2016-08-29 ENCOUNTER — Encounter: Payer: Medicare Other | Admitting: *Deleted

## 2016-08-29 NOTE — Telephone Encounter (Signed)
Spoke with pt and reminded pt of remote transmission that is due today. Pt verbalized understanding.   

## 2016-08-30 ENCOUNTER — Encounter: Payer: Self-pay | Admitting: Cardiology

## 2016-09-03 ENCOUNTER — Other Ambulatory Visit: Payer: Self-pay

## 2016-09-03 ENCOUNTER — Ambulatory Visit (INDEPENDENT_AMBULATORY_CARE_PROVIDER_SITE_OTHER): Payer: Medicare Other

## 2016-09-03 DIAGNOSIS — R0601 Orthopnea: Secondary | ICD-10-CM | POA: Diagnosis not present

## 2016-09-03 DIAGNOSIS — I428 Other cardiomyopathies: Secondary | ICD-10-CM

## 2016-09-04 ENCOUNTER — Telehealth: Payer: Self-pay | Admitting: *Deleted

## 2016-09-04 ENCOUNTER — Telehealth: Payer: Self-pay | Admitting: Internal Medicine

## 2016-09-04 MED ORDER — LOSARTAN POTASSIUM 25 MG PO TABS: 25.0000 mg | ORAL_TABLET | Freq: Every day | ORAL | 5 refills | Status: DC

## 2016-09-04 MED ORDER — CARVEDILOL 3.125 MG PO TABS: 3.1250 mg | ORAL_TABLET | Freq: Two times a day (BID) | ORAL | 5 refills | Status: DC

## 2016-09-04 NOTE — Telephone Encounter (Signed)
-----   Message from [name], MD sent at 09/04/2016  8:49 AM EDT ----- Results discussed with patient by phone. Will start carvedilol 3.125 mg twice a day and losartan 25 mg daily. Can we have Roy Palmer return to the clinic in 2 weeks for a basic metabolic panel and blood pressure check? He should see me in the office in about a month. Thanks.

## 2016-09-04 NOTE — Telephone Encounter (Signed)
I spoke with Roy Palmer regarding result of his echocardiogram demonstrating reduced LV function (now approximately 20%). He also has moderate MR status post mitral valve repair last fall. We have agreed to reinitiate medical therapy, including carvedilol 3.125 mg twice a day and losartan 25 mg twice a day. The patient has noted some shortness of breath but otherwise feels well. I will plan to have him return to the office in about 2 weeks for a basic metabolic panel. He will see me back in the office in a month.

## 2016-09-04 NOTE — Telephone Encounter (Signed)
Notes recorded by Stann Mainland, RN on 09/04/2016 at 1:22 PM EDT No answer. Left message to call back to schedule appts.  Message sent to scheduling. ------

## 2016-09-05 NOTE — Telephone Encounter (Signed)
Called patient and scheduled appointments.

## 2016-09-11 ENCOUNTER — Telehealth: Payer: Self-pay | Admitting: Internal Medicine

## 2016-09-11 DIAGNOSIS — Z79899 Other long term (current) drug therapy: Secondary | ICD-10-CM

## 2016-09-11 DIAGNOSIS — I428 Other cardiomyopathies: Secondary | ICD-10-CM

## 2016-09-11 DIAGNOSIS — I10 Essential (primary) hypertension: Secondary | ICD-10-CM

## 2016-09-11 MED ORDER — FUROSEMIDE 40 MG PO TABS
40.0000 mg | ORAL_TABLET | Freq: Every day | ORAL | 5 refills | Status: DC
Start: 2016-09-11 — End: 2017-04-07

## 2016-09-11 NOTE — Telephone Encounter (Signed)
Called and spoke with patient. He verbalized understanding and he will go to the [facility] tomorrow morning for the labs.

## 2016-09-11 NOTE — Telephone Encounter (Signed)
Returned call to patient. He said while sleeping he wakes up at night and its "very hard to find air." He finds relief when he lays on his right side. He also experiences some SOB with exertion while up and around the house. Denies chest pain, dizziness, nausea, vomiting, or swelling. Patient is taking the new medications Dr End prescribed during phone call on 09/04/16 as prescribed: carvedilol and losartan. Patient has not been taking his BP at home. We discussed his reduced LV function and how sometime that can contribute to his shortness of breath. Patient scheduled for labs/BP check on 09/20/16 and next appt with Dr End on 10/02/16.  Will route to Dr End for advice.

## 2016-09-11 NOTE — Telephone Encounter (Signed)
I spoke with mr. Haldeman, who reports worsening DOE and orthopnea. Breathing was particularly bad last night, making it difficult for him to sleep. He denies swelling, lightheadedness and chest pain, though he states that his lungs feel "sore." He has not been weighing himself. I advised him that his symptoms are concerning for heart failure. We will start furosemide 40 mg PO daily today and have him come in for labs tomorrow (BMP and BNP). I advised him to come to the ED or call 911 if his breathing worsens.  [name], MD [facility] Pager: [phone]

## 2016-09-11 NOTE — Telephone Encounter (Signed)
Pt calling stating when he lays down at night  He is having issues breathing Going on a while PCP did xray and they did not find anything Would like to know what is next on our agenda to do, to maybe help with this Please advise

## 2016-09-12 ENCOUNTER — Other Ambulatory Visit
Admission: RE | Admit: 2016-09-12 | Discharge: 2016-09-12 | Disposition: A | Discharge status: Home / Self Care | Payer: Medicare Other | Source: Ambulatory Visit | Attending: Internal Medicine | Admitting: Internal Medicine

## 2016-09-12 DIAGNOSIS — I10 Essential (primary) hypertension: Secondary | ICD-10-CM | POA: Diagnosis present

## 2016-09-12 DIAGNOSIS — Z79899 Other long term (current) drug therapy: Secondary | ICD-10-CM | POA: Diagnosis present

## 2016-09-12 DIAGNOSIS — I428 Other cardiomyopathies: Secondary | ICD-10-CM | POA: Diagnosis present

## 2016-09-12 LAB — BASIC METABOLIC PANEL
Anion gap: 4 — ABNORMAL LOW (ref 5–15)
BUN: 22 mg/dL — ABNORMAL HIGH (ref 6–20)
CO2: 28 mmol/L (ref 22–32)
Calcium: 8.8 mg/dL — ABNORMAL LOW (ref 8.9–10.3)
Chloride: 107 mmol/L (ref 101–111)
Creatinine, Ser: 1.58 mg/dL — ABNORMAL HIGH (ref 0.61–1.24)
GFR calc Af Amer: 45 mL/min — ABNORMAL LOW (ref >60)
GFR calc non Af Amer: 38 mL/min — ABNORMAL LOW (ref >60)
Glucose, Bld: 136 mg/dL — ABNORMAL HIGH (ref 65–99)
Potassium: 4.2 mmol/L (ref 3.5–5.1)
Sodium: 139 mmol/L (ref 135–145)

## 2016-09-12 LAB — BRAIN NATRIURETIC PEPTIDE: B Natriuretic Peptide: 805 pg/mL — ABNORMAL HIGH (ref 0.0–100.0)

## 2016-09-16 ENCOUNTER — Ambulatory Visit (INDEPENDENT_AMBULATORY_CARE_PROVIDER_SITE_OTHER): Payer: Medicare Other | Admitting: *Deleted

## 2016-09-16 DIAGNOSIS — I428 Other cardiomyopathies: Secondary | ICD-10-CM

## 2016-09-16 DIAGNOSIS — I509 Heart failure, unspecified: Secondary | ICD-10-CM

## 2016-09-17 LAB — CUP PACEART REMOTE DEVICE CHECK
Battery Remaining Longevity: 130 mo
Battery Voltage: 3.07 V
Brady Statistic AP VP Percent: 0 %
Brady Statistic AP VS Percent: 0.16 %
Brady Statistic AS VP Percent: 0.08 %
Brady Statistic AS VS Percent: 99.76 %
Brady Statistic RA Percent Paced: 0.17 %
Brady Statistic RV Percent Paced: 0.08 %
Date Time Interrogation Session: 20180416172853
HighPow Impedance: 67 Ohm
Implantable Lead Implant Date: 20171025
Implantable Lead Implant Date: 20171025
Implantable Lead Location: 753859
Implantable Lead Location: 753860
Implantable Lead Model: 5076
Implantable Pulse Generator Implant Date: 20171025
Lead Channel Impedance Value: 361 Ohm
Lead Channel Impedance Value: 418 Ohm
Lead Channel Impedance Value: 456 Ohm
Lead Channel Pacing Threshold Amplitude: 0.75 V
Lead Channel Pacing Threshold Amplitude: 0.75 V
Lead Channel Pacing Threshold Pulse Width: 0.4 ms
Lead Channel Pacing Threshold Pulse Width: 0.4 ms
Lead Channel Sensing Intrinsic Amplitude: 2.875 mV
Lead Channel Sensing Intrinsic Amplitude: 2.875 mV
Lead Channel Sensing Intrinsic Amplitude: 3.625 mV
Lead Channel Sensing Intrinsic Amplitude: 3.625 mV
Lead Channel Setting Pacing Amplitude: 2 V
Lead Channel Setting Pacing Amplitude: 2.5 V
Lead Channel Setting Pacing Pulse Width: 0.4 ms
Lead Channel Setting Sensing Sensitivity: 0.3 mV

## 2016-09-17 NOTE — Progress Notes (Signed)
Remote ICD transmission.   

## 2016-09-18 ENCOUNTER — Ambulatory Visit (INDEPENDENT_AMBULATORY_CARE_PROVIDER_SITE_OTHER): Payer: Medicare Other | Admitting: Internal Medicine

## 2016-09-18 ENCOUNTER — Encounter: Payer: Self-pay | Admitting: Internal Medicine

## 2016-09-18 ENCOUNTER — Encounter: Payer: Self-pay | Admitting: Cardiology

## 2016-09-18 VITALS — BP 106/76 | HR 79 | Ht 71.0 in | Wt 195.0 lb

## 2016-09-18 DIAGNOSIS — I472 Ventricular tachycardia, unspecified: Secondary | ICD-10-CM

## 2016-09-18 DIAGNOSIS — I10 Essential (primary) hypertension: Secondary | ICD-10-CM | POA: Diagnosis not present

## 2016-09-18 DIAGNOSIS — I340 Nonrheumatic mitral (valve) insufficiency: Secondary | ICD-10-CM

## 2016-09-18 DIAGNOSIS — I5023 Acute on chronic systolic (congestive) heart failure: Secondary | ICD-10-CM | POA: Diagnosis not present

## 2016-09-18 DIAGNOSIS — I428 Other cardiomyopathies: Secondary | ICD-10-CM | POA: Diagnosis not present

## 2016-09-18 NOTE — Progress Notes (Signed)
Follow-up Outpatient Visit Date: 09/18/2016  Chief Complaint: Follow-up mitral regurgitation with chronic systolic heart failure and recurrent ventricular tachycardia  HPI:  Roy Palmer is a 83 y.o. year-old male with history of severe mitral regurgitation status post mitral valve repair (03/2016), ventricular tachycardia status post ICD (03/2016), hypertension, hyperlipidemia, and chronic kidney disease, who presents for follow-up. I last saw him on 08/07/16, which time Roy Palmer reported an episode of orthopnea resolved after brief treatment with furosemide prescribed by his primary care provider. At the time, he was taking only low-dose aspirin, as he had experienced side effects in the past with ace inhibitors and beta blockers. We agreed to perform a transthoracic echocardiogram, which showed interval decline in LV systolic function as well as worsening mitral regurgitation. I spoke with Roy Palmer on the phone, and we agreed to initiate low-dose losartan and carvedilol. He subsequently noted progressive shortness of breath, prompting [place] to add standing furosemide.  Today, Roy Palmer happily reports that his dyspnea has completely resolved. He no longer has orthopnea. He also denies chest pain, palpitations, lightheadedness, and edema. He is tolerating carvedilol, losartan, and furosemide well. Roy Palmer has continued to work in his garden without any limitations. He has not had any ICD shocks. Remote interrogation yesterday showed 8 episodes of brief nonsustained VT but no other significant abnormalities.  --------------------------------------------------------------------------------------------------  Cardiovascular History & Procedures: Cardiovascular Problems:  Severe mitral regurgitation status post repair (03/2016)  Chronic systolic heart failure secondary to nonischemic cardiomyopathy  Frequent ventricular ectopy with sustained ventricular tachycardia status post  ICD  Nonobstructive coronary artery disease  Risk Factors:  Age, male gender, hypertension, and hyperlipidemia  Cath/PCI:  LHC/RHC (02/23/16): Mild, nonobstructive CAD (30% ostial LMCA, 30% ostial LAD, 30% ostial ramus, and minimal luminal irregularities of dominant LCx. Normal right heart filling pressures. Decreased Fick cardiac output (CO 3.6 L/m, CI 1.8 L/m/m) in setting of frequent PVCs and severe MR.  CV Surgery:  Minimally invasive mitral valve repair (03/19/16, Dr. Cornelius Moras)  EP Procedures and Devices:  24-hour Holter monitor (02/27/16): Predominantly sinus rhythm with average heart rate is 63 bpm (range 49-91 bpm). Longest RR interval 1.6 seconds. Frequent PVCs noted (11% burden) including frequent couplets and bigeminal cycles. 90 runs of NSVT were noted lasting up to 9 beats the maximum rate of 134 bpm. Rare SVT and supraventricular ectopy noted.  Dual chamber ICD (03/27/16, [employer])  Non-Invasive Evaluation(s):  TTE (09/03/16): Moderately dilated left ventricle with LVEF less than 20% and global hypokinesis. Prior surgical repair of mitral valve is evident with moderate regurgitation. Left atrium is moderately dilated. RV size and function are normal. Moderate pulmonary hypertension noted with RVSP of 54 mmHg.  Limited TTE (03/22/16): Normal LV size with moderate LVH and LVEF of 40-45% with possible inferior hypokinesis and incoordinate septal motion. Mitral angioplasty ring in place with trivial MR. No significant mitral stenosis. Mild left atrial enlargement. Normal RV was moderately reduced systolic function. RVSP 41 mm or mercury. Elevated central venous pressure.  TTE (02/14/16): Normal LV size with moderate LVH. EF mildly reduced at 50% with possible inferolateral hypokinesis. Moderate to severe MR noted with prolapse/flail of the posterior leaflet. Severe left atrial enlargement. Moderate TR.  TEE (02/23/16): Normal LV size and wall thickness with lower normal LVEF. Trivial  AI. Severe, eccentric MR with reversal of pulmonary vein flow and flail posterior leaflet. No left atrial thrombus. Lipomatous hypertrophy of the septum. Mild to moderate TR.  Limited TTE (03/22/16): Normal LV size with moderate LVH.  LVEF 40-45% with possible inferior hypokinesis. Trivial MR with mild left atrial enlargement. Normal RV size with moderately reduced systolic function. Mild TR. No pericardial effusion.  Recent CV Pertinent Labs: Lab Results  Component Value Date   CHOL 182 03/13/2016   CHOL 188 02/14/2016   HDL 33 (L) 03/13/2016   HDL 36 (L) 02/14/2016   LDLCALC 116 (H) 03/13/2016   LDLCALC 127 (H) 02/14/2016   TRIG 163 (H) 03/13/2016   CHOLHDL 5.5 03/13/2016   INR 3.8 06/05/2016   INR 2.45 03/29/2016   BNP 805.0 (H) 09/12/2016   K 4.2 09/12/2016   MG 2.5 (H) 07/24/2016   BUN 22 (H) 09/12/2016   BUN 21 07/30/2016   CREATININE 1.58 (H) 09/12/2016   Past medical and surgical history were reviewed and updated in EPIC.  Outpatient Encounter Prescriptions as of 09/18/2016  Medication Sig  . aspirin EC 81 MG tablet Take 1 tablet (81 mg total) by mouth daily.  . carvedilol (COREG) 3.125 MG tablet Take 1 tablet (3.125 mg total) by mouth 2 (two) times daily.  . furosemide (LASIX) 40 MG tablet Take 1 tablet (40 mg total) by mouth daily.  Marland Kitchen losartan (COZAAR) 25 MG tablet Take 1 tablet (25 mg total) by mouth daily.   No facility-administered encounter medications on file as of 09/18/2016.     Allergies: Amiodarone and Lyrica [pregabalin]  Social History   Social History  . Marital status: Married    Spouse name: N/A  . Number of children: N/A  . Years of education: N/A   Occupational History  . Not on file.   Social History Main Topics  . Smoking status: Never Smoker  . Smokeless tobacco: Never Used  . Alcohol use No  . Drug use: No  . Sexual activity: Not on file   Other Topics Concern  . Not on file   Social History Narrative  . No narrative on file     Family History  Problem Relation Age of Onset  . Hypertension Mother   . Stroke Mother   . Heart disease Father   . Heart attack Father   . Dementia Sister   . Retinoblastoma Son   . COPD Neg Hx   . Cancer Neg Hx   . Diabetes Neg Hx     Review of Systems: A 12-system review of systems was performed and was negative except as noted in the HPI.  --------------------------------------------------------------------------------------------------  Physical Exam: There were no vitals taken for this visit.  General:  Well-developed, well-nourished elderly man seated comfortably in the exam room. He is accompanied by his son. HEENT: No conjunctival pallor or scleral icterus.  Moist mucous membranes.  OP clear. Neck: Supple without lymphadenopathy, thyromegaly, JVD, or HJR. Lungs: Normal work of breathing.  Clear to auscultation bilaterally without wheezes or crackles. Heart: Regular rate and rhythm with 2/6 systolic murmur loudest at the left lower sternal border.  Non-displaced PMI. Abd: Bowel sounds present.  Soft, NT/ND without hepatosplenomegaly Ext: No lower extremity edema.  Radial, PT, and DP pulses are 2+ bilaterally. Skin: warm and dry without rash.  EKG:  Normal sinus rhythm with first-degree AV block, right bundle branch block with left axis deviation, borderline LVH, and single PVC. No significant change from prior tracing on 08/07/16 (I personally reviewed both tracings).  Lab Results  Component Value Date   WBC 7.8 07/30/2016   HGB 12.1 (L) 03/27/2016   HCT 40.5 07/30/2016   MCV 89 07/30/2016   PLT 188 07/30/2016  Lab Results  Component Value Date   NA 139 09/12/2016   K 4.2 09/12/2016   CL 107 09/12/2016   CO2 28 09/12/2016   BUN 22 (H) 09/12/2016   CREATININE 1.58 (H) 09/12/2016   GLUCOSE 136 (H) 09/12/2016   ALT 15 07/30/2016    Lab Results  Component Value Date   CHOL 182 03/13/2016   HDL 33 (L) 03/13/2016   LDLCALC 116 (H) 03/13/2016   TRIG  163 (H) 03/13/2016   CHOLHDL 5.5 03/13/2016   BNP (2/58/52): 805 (previously 111 on 03/12/16)  --------------------------------------------------------------------------------------------------  ASSESSMENT AND PLAN: Acute on chronic systolic heart failure secondary to nonischemic cardiomyopathy Roy Palmer appears euvolemic and well compensated with NYHA class I-II heart failure symptoms. Echocardiogram since her last visit was notable for severe LV dysfunction, which has declined since his hospitalization in October. Recent BNP was also quite elevated. He is tolerating low-dose carvedilol and losartan well. Due to borderline low blood pressures and history of lightheadedness with ACE inhibitors and beta blockers, we will not escalate these medications further today. We will continue with standing furosemide and check a basic metabolic panel to reevaluate his renal function and potassium. Based on these results, we could consider decreasing furosemide to 20 mg daily or 40 mg every other day.  Severe mitral regurgitation status post mitral valve repair  Patient recovered well from surgery. Recent echo was notable for moderate mitral regurgitation, which is likely functional secondary to worsening cardiomyopathy. As above, he appears euvolemic and well compensated. A 2/6 holosystolic murmur is appreciated on exam today. We will continue with medical therapy, as described above, including low-dose aspirin.  Ventricular tachycardia No lightheadedness or palpitations. ICD interrogation 2 days ago was notable for a brief episodes of nonsustained VT. Hopefully, addition of beta blocker will help with this. Patient should continue to follow with Dr. Ladona Ridgel, as previously arranged.  Hypertension Low-normal blood pressure today without symptoms. We will continue current medications.  Follow-up: Return to clinic in 1 month.  [name], MD 09/18/2016 8:41 AM

## 2016-09-18 NOTE — Patient Instructions (Signed)
Medication Instructions:  Your physician recommends that you continue on your current medications as directed. Please refer to the Current Medication list given to you today.   Labwork: Your physician recommends that you return for lab work in: TODAY ( BMP).   Testing/Procedures: none  Follow-Up: Your physician recommends that you schedule a follow-up appointment in: 1 MONTH WITH DR END.   If you need a refill on your cardiac medications before your next appointment, please call your pharmacy.

## 2016-09-19 LAB — BASIC METABOLIC PANEL
BUN/Creatinine Ratio: 20 (ref 10–24)
BUN: 27 mg/dL (ref 8–27)
CO2: 24 mmol/L (ref 18–29)
Calcium: 8.8 mg/dL (ref 8.6–10.2)
Chloride: 100 mmol/L (ref 96–106)
Creatinine, Ser: 1.38 mg/dL — ABNORMAL HIGH (ref 0.76–1.27)
GFR calc Af Amer: 54 mL/min/{1.73_m2} — ABNORMAL LOW (ref >59)
GFR calc non Af Amer: 47 mL/min/{1.73_m2} — ABNORMAL LOW (ref >59)
Glucose: 103 mg/dL — ABNORMAL HIGH (ref 65–99)
Potassium: 4.4 mmol/L (ref 3.5–5.2)
Sodium: 140 mmol/L (ref 134–144)

## 2016-09-20 ENCOUNTER — Other Ambulatory Visit: Payer: Medicare Other

## 2016-10-02 ENCOUNTER — Encounter: Payer: Self-pay | Admitting: Cardiology

## 2016-10-02 ENCOUNTER — Ambulatory Visit: Payer: Medicare Other | Admitting: Internal Medicine

## 2016-10-22 ENCOUNTER — Encounter: Payer: Self-pay | Admitting: Internal Medicine

## 2016-10-22 ENCOUNTER — Ambulatory Visit (INDEPENDENT_AMBULATORY_CARE_PROVIDER_SITE_OTHER): Payer: Medicare Other | Admitting: Internal Medicine

## 2016-10-22 VITALS — BP 118/64 | HR 75 | Ht 71.0 in | Wt 194.5 lb

## 2016-10-22 DIAGNOSIS — I472 Ventricular tachycardia, unspecified: Secondary | ICD-10-CM

## 2016-10-22 DIAGNOSIS — I5022 Chronic systolic (congestive) heart failure: Secondary | ICD-10-CM | POA: Diagnosis not present

## 2016-10-22 DIAGNOSIS — I428 Other cardiomyopathies: Secondary | ICD-10-CM

## 2016-10-22 DIAGNOSIS — I38 Endocarditis, valve unspecified: Secondary | ICD-10-CM | POA: Insufficient documentation

## 2016-10-22 DIAGNOSIS — I10 Essential (primary) hypertension: Secondary | ICD-10-CM

## 2016-10-22 MED ORDER — CARVEDILOL 6.25 MG PO TABS: 6.2500 mg | ORAL_TABLET | Freq: Two times a day (BID) | ORAL | 3 refills | Status: DC

## 2016-10-22 NOTE — Patient Instructions (Signed)
Medication Instructions:  Your physician has recommended you make the following change in your medication:  1- INCREASE Carvedilol to 6.25 mg by mouth twice a day.   Labwork: NONE  Testing/Procedures: NONE  Follow-Up: Your physician recommends that you schedule a follow-up appointment in: 3 MONTHS WITH DR END.  If you need a refill on your cardiac medications before your next appointment, please call your pharmacy.

## 2016-10-22 NOTE — Progress Notes (Signed)
Follow-up Outpatient Visit Date: 10/22/2016  Chief Complaint: Follow-up edema and shortness of breath in the setting of mitral regurgitation and nonischemic cardiomyopathy  HPI:  Roy Palmer is a 83 y.o. year-old male with history of severe mitral regurgitation status post mitral valve repair (03/2016), ventricular tachycardia status post ICD (03/2016), nonischemic cardiomyopathy, hypertension, hyperlipidemia, and chronic kidney disease, who presents for follow-up. I last saw him on 09/18/16, at which time he was gradually improving from acute on chronic systolic heart failure. This winter and early spring, he experienced progressive orthopnea and edema, with subsequent echocardiogram revealing decline in LV function to 20-25%. We added low-dose carvedilol and losartan, which Roy Palmer has tolerated relatively well. He initially noted some fatigue but states that this has since resolved. Today, he reports feeling quite well. His breathing is back to normal. He is able to work in his garden without limitations. He no longer has shortness of breath at rest or with exertion. He also denies orthopnea and PND. He has not had any chest pain, palpitations, or lightheadedness. He denies ICD shocks. His weight has been stable. He remains on furosemide 40 mg daily as well as carvedilol 3.125 mg twice a day and losartan 25 mg daily.  --------------------------------------------------------------------------------------------------  Cardiovascular History & Procedures: Cardiovascular Problems:  Severe mitral regurgitation status post repair (03/2016)  Chronic systolic heart failure secondary to nonischemic cardiomyopathy  Frequent ventricular ectopy with sustained ventricular tachycardia status post ICD  Nonobstructive coronary artery disease  Risk Factors:  Age, male gender, hypertension, and hyperlipidemia  Cath/PCI:  LHC/RHC (02/23/16): Mild, nonobstructive CAD (30% ostial LMCA, 30% ostial LAD,  30% ostial ramus, and minimal luminal irregularities of dominant LCx. Normal right heart filling pressures. Decreased Fick cardiac output (CO 3.6 L/m, CI 1.8 L/m/m) in setting of frequent PVCs and severe MR.  CV Surgery:  Minimally invasive mitral valve repair (03/19/16, Dr. Cornelius Moras)  EP Procedures and Devices:  24-hour Holter monitor (02/27/16): Predominantly sinus rhythm with average heart rate is 63 bpm (range 49-91 bpm). Longest RR interval 1.6 seconds. Frequent PVCs noted (11% burden) including frequent couplets and bigeminal cycles. 90 runs of NSVT were noted lasting up to 9 beats the maximum rate of 134 bpm. Rare SVT and supraventricular ectopy noted.  Dual chamber ICD (03/27/16, [employer])  Non-Invasive Evaluation(s):  TTE (09/03/16): Moderately dilated left ventricle with LVEF less than 20% and global hypokinesis. Prior surgical repair of mitral valve is evident with moderate regurgitation. Left atrium is moderately dilated. RV size and function are normal. Moderate pulmonary hypertension noted with RVSP of 54 mmHg.  Limited TTE (03/22/16): Normal LV size with moderate LVH and LVEF of 40-45% with possible inferior hypokinesis and incoordinate septal motion. Mitral angioplasty ring in place with trivial MR. No significant mitral stenosis. Mild left atrial enlargement. Normal RV was moderately reduced systolic function. RVSP 41 mm or mercury. Elevated central venous pressure.  TTE (02/14/16): Normal LV size with moderate LVH. EF mildly reduced at 50% with possible inferolateral hypokinesis. Moderate to severe MR noted with prolapse/flail of the posterior leaflet. Severe left atrial enlargement. Moderate TR.  TEE (02/23/16): Normal LV size and wall thickness with lower normal LVEF. Trivial AI. Severe, eccentric MR with reversal of pulmonary vein flow and flail posterior leaflet. No left atrial thrombus. Lipomatous hypertrophy of the septum. Mild to moderate TR.  Limited TTE (03/22/16): Normal LV  size with moderate LVH. LVEF 40-45% with possible inferior hypokinesis. Trivial MR with mild left atrial enlargement. Normal RV size with moderately reduced  systolic function. Mild TR. No pericardial effusion.  Recent CV Pertinent Labs: Lab Results  Component Value Date   CHOL 182 03/13/2016   CHOL 188 02/14/2016   HDL 33 (L) 03/13/2016   HDL 36 (L) 02/14/2016   LDLCALC 116 (H) 03/13/2016   LDLCALC 127 (H) 02/14/2016   TRIG 163 (H) 03/13/2016   CHOLHDL 5.5 03/13/2016   INR 3.8 06/05/2016   INR 2.45 03/29/2016   BNP 805.0 (H) 09/12/2016   K 4.4 09/18/2016   MG 2.5 (H) 07/24/2016   BUN 27 09/18/2016   CREATININE 1.38 (H) 09/18/2016   Past medical and surgical history were reviewed and updated in EPIC.  Outpatient Encounter Prescriptions as of 10/22/2016  Medication Sig  . aspirin EC 81 MG tablet Take 1 tablet (81 mg total) by mouth daily.  . carvedilol (COREG) 3.125 MG tablet Take 1 tablet (3.125 mg total) by mouth 2 (two) times daily.  . furosemide (LASIX) 40 MG tablet Take 1 tablet (40 mg total) by mouth daily. (Patient taking differently: Take 20 mg by mouth daily. )  . losartan (COZAAR) 25 MG tablet Take 1 tablet (25 mg total) by mouth daily.   No facility-administered encounter medications on file as of 10/22/2016.     Allergies: Amiodarone and Lyrica [pregabalin]  Social History   Social History  . Marital status: Married    Spouse name: N/A  . Number of children: N/A  . Years of education: N/A   Occupational History  . Not on file.   Social History Main Topics  . Smoking status: Never Smoker  . Smokeless tobacco: Never Used  . Alcohol use No  . Drug use: No  . Sexual activity: Not on file   Other Topics Concern  . Not on file   Social History Narrative  . No narrative on file    Family History  Problem Relation Age of Onset  . Hypertension Mother   . Stroke Mother   . Heart disease Father   . Heart attack Father   . Dementia Sister   .  Retinoblastoma Son   . COPD Neg Hx   . Cancer Neg Hx   . Diabetes Neg Hx     Review of Systems: A 12-system review of systems was performed and was negative except as noted in the HPI.  --------------------------------------------------------------------------------------------------  Physical Exam: BP 118/64 (BP Location: Left Arm, Patient Position: Sitting, Cuff Size: Normal)   Pulse 75   Ht 5\' 11"  (1.803 m)   Wt 194 lb 8 oz (88.2 kg)   BMI 27.13 kg/m   General:  Overweight, elderly man, seated comfortably in the exam room. He is alone today. HEENT: No conjunctival pallor or scleral icterus. Oropharynx is clear with moist mucous membranes. Neck: No lymphadenopathy, thyromegaly, JVD, or HJR. Lungs: Clear bilaterally without wheezes or crackles. Normal work of breathing. Heart: Regular rate and rhythm with rare extrasystoles. 1/6 systolic murmur loudest at the left lower sternal border. Non-displaced PMI. Abd: Soft, nontender, nondistended without HSM. Bowel sounds present. Ext: Trace pretibial edema bilaterally. 2+ radial and pedal pulses bilaterally. Skin: Warm and dry without rash.  EKG:  Normal sinus rhythm with first-degree AV block and isolated PVC. Left axis deviation and right bundle branch block.  Lab Results  Component Value Date   WBC 7.8 07/30/2016   HGB 12.1 (L) 03/27/2016   HCT 40.5 07/30/2016   MCV 89 07/30/2016   PLT 188 07/30/2016    Lab Results  Component Value Date  NA 140 09/18/2016   K 4.4 09/18/2016   CL 100 09/18/2016   CO2 24 09/18/2016   BUN 27 09/18/2016   CREATININE 1.38 (H) 09/18/2016   GLUCOSE 103 (H) 09/18/2016   ALT 15 07/30/2016    Lab Results  Component Value Date   CHOL 182 03/13/2016   HDL 33 (L) 03/13/2016   LDLCALC 116 (H) 03/13/2016   TRIG 163 (H) 03/13/2016   CHOLHDL 5.5 03/13/2016   BNP (09/10/79): 805 (previously 111 on  03/12/16)  --------------------------------------------------------------------------------------------------  ASSESSMENT AND PLAN: Chronic systolic heart failure secondary to nonischemic cardiomyopathy related to valvular heart disease Roy Palmer reports that he is back to his baseline in regard to breathing and energy, consistent with NYHA class I-II heart failure. He has trace edema bilaterally to the mid calves but otherwise appears euvolemic. He is tolerating low-dose carvedilol and losartan well. We have discussed the importance of titration of evidence-based heart failure therapies and have agreed to increase carvedilol to 6.25 mg twice a day. He will contact us if he experiences increasing fatigue or other symptoms.  Ventricular tachycardia The patient has been without palpitations, lightheadedness, or ICD shocks. He should continue routine monitoring through the device clinic. As above, we will escalate carvedilol today.  Hypertension Blood pressure is normal. We will increase carvedilol and continue current doses of losartan and furosemide.  Follow-up: Return to clinic in 3 months.  [name], MD 10/22/2016 7:40 PM

## 2016-11-12 ENCOUNTER — Ambulatory Visit: Payer: Medicare Other | Admitting: Internal Medicine

## 2016-11-14 ENCOUNTER — Telehealth: Payer: Self-pay | Admitting: Internal Medicine

## 2016-11-14 DIAGNOSIS — Z79899 Other long term (current) drug therapy: Secondary | ICD-10-CM

## 2016-11-14 DIAGNOSIS — R42 Dizziness and giddiness: Secondary | ICD-10-CM

## 2016-11-14 NOTE — Telephone Encounter (Signed)
Pt c/o medication issue:  1. Name of Medication: pt is on three medication and he don't know which one is giving him the issue  2. How are you currently taking this medication (dosage and times per day)? Different times a day   3. Are you having a reaction (difficulty breathing--STAT)? no  4. What is your medication issue? Hot flashes and his vision

## 2016-11-14 NOTE — Telephone Encounter (Signed)
Transmission received at 1702.  Presenting rhythm is sinus rhythm at ~85bpm.  4 monitored NSVT episodes on 4/25, 5/5, 5/15, and 10/24/16--longest is ~15 beats.  No treated episodes.  No AT/AF episodes.  No alerts or abnormalities noted, ICD functioning appropriately.  Thoracic impedance stable at this time.

## 2016-11-14 NOTE — Telephone Encounter (Signed)
Returned call to patient. Patient has been experiencing dizziness and hot flashes increasing over the past 2 weeks. Its worse in the morning and by lunch time it seems better but he still has the episodes. On 10/22/16, Dr End increased carvedilol to 6.25 mg BID and patient took it for 2 days only. After 2 days, he was feeling too tired and weak and decreased carvedilol to 3.125 mg BID himself. Patient is taking other medications as prescribed. BP this morning was 117/50, HR 61. This is the only day he has taken his blood pressure in over a month at home. Denies chest pain, SOB, palpitations. Had patient send in remote transmission to device clinic.  Attempted to call device clinic. Unable to reach at this time. Will route to device clinic to review transmission. Talked over with Dr Mariah Milling, no changes with medications at this time and continue to monitor blood pressure, HR, and stay hydrated. Advised patient to send in the remote transmission and to stay hydrated, and continue to monitor symptoms and blood pressure, heart rate. Will route to Dr End for review and route to device clinic to review transmission. Patient will call back tomorrow with an update.

## 2016-11-15 ENCOUNTER — Ambulatory Visit: Payer: Medicare Other | Admitting: *Deleted

## 2016-11-15 ENCOUNTER — Ambulatory Visit (INDEPENDENT_AMBULATORY_CARE_PROVIDER_SITE_OTHER): Payer: Medicare Other | Admitting: Physician Assistant

## 2016-11-15 ENCOUNTER — Other Ambulatory Visit
Admission: RE | Admit: 2016-11-15 | Discharge: 2016-11-15 | Disposition: A | Discharge status: Home / Self Care | Payer: Medicare Other | Source: Ambulatory Visit | Attending: Internal Medicine | Admitting: Internal Medicine

## 2016-11-15 ENCOUNTER — Telehealth: Payer: Self-pay | Admitting: Internal Medicine

## 2016-11-15 ENCOUNTER — Encounter: Payer: Self-pay | Admitting: Physician Assistant

## 2016-11-15 VITALS — BP 121/64 | HR 41 | Ht 71.0 in | Wt 191.0 lb

## 2016-11-15 VITALS — BP 110/60 | HR 79 | Ht 71.0 in | Wt 194.5 lb

## 2016-11-15 DIAGNOSIS — R42 Dizziness and giddiness: Secondary | ICD-10-CM | POA: Diagnosis not present

## 2016-11-15 DIAGNOSIS — I5022 Chronic systolic (congestive) heart failure: Secondary | ICD-10-CM

## 2016-11-15 DIAGNOSIS — Z9889 Other specified postprocedural states: Secondary | ICD-10-CM

## 2016-11-15 DIAGNOSIS — I428 Other cardiomyopathies: Secondary | ICD-10-CM | POA: Diagnosis not present

## 2016-11-15 DIAGNOSIS — I951 Orthostatic hypotension: Secondary | ICD-10-CM | POA: Diagnosis not present

## 2016-11-15 DIAGNOSIS — Z79899 Other long term (current) drug therapy: Secondary | ICD-10-CM

## 2016-11-15 DIAGNOSIS — R531 Weakness: Secondary | ICD-10-CM

## 2016-11-15 DIAGNOSIS — R001 Bradycardia, unspecified: Secondary | ICD-10-CM | POA: Diagnosis not present

## 2016-11-15 LAB — BASIC METABOLIC PANEL
Anion gap: 6 (ref 5–15)
BUN: 26 mg/dL — ABNORMAL HIGH (ref 6–20)
CO2: 26 mmol/L (ref 22–32)
Calcium: 8.7 mg/dL — ABNORMAL LOW (ref 8.9–10.3)
Chloride: 108 mmol/L (ref 101–111)
Creatinine, Ser: 1.54 mg/dL — ABNORMAL HIGH (ref 0.61–1.24)
GFR calc Af Amer: 46 mL/min — ABNORMAL LOW (ref >60)
GFR calc non Af Amer: 40 mL/min — ABNORMAL LOW (ref >60)
Glucose, Bld: 97 mg/dL (ref 65–99)
Potassium: 4.5 mmol/L (ref 3.5–5.1)
Sodium: 140 mmol/L (ref 135–145)

## 2016-11-15 LAB — MAGNESIUM: Magnesium: 2.5 mg/dL — ABNORMAL HIGH (ref 1.7–2.4)

## 2016-11-15 LAB — CBC WITH DIFFERENTIAL/PLATELET
Basophils Absolute: 0.1 10*3/uL (ref 0–0.1)
Basophils Relative: 1 %
Eosinophils Absolute: 0.3 10*3/uL (ref 0–0.7)
Eosinophils Relative: 5 %
HCT: 43.7 % (ref 40.0–52.0)
Hemoglobin: 14.7 g/dL (ref 13.0–18.0)
Lymphocytes Relative: 23 %
Lymphs Abs: 1.6 10*3/uL (ref 1.0–3.6)
MCH: 30.4 pg (ref 26.0–34.0)
MCHC: 33.6 g/dL (ref 32.0–36.0)
MCV: 90.5 fL (ref 80.0–100.0)
Monocytes Absolute: 0.6 10*3/uL (ref 0.2–1.0)
Monocytes Relative: 9 %
Neutro Abs: 4.2 10*3/uL (ref 1.4–6.5)
Neutrophils Relative %: 62 %
Platelets: 149 10*3/uL — ABNORMAL LOW (ref 150–440)
RBC: 4.82 MIL/uL (ref 4.40–5.90)
RDW: 15.9 % — ABNORMAL HIGH (ref 11.5–14.5)
WBC: 6.7 10*3/uL (ref 3.8–10.6)

## 2016-11-15 LAB — TSH: TSH: 1.055 u[IU]/mL (ref 0.350–4.500)

## 2016-11-15 MED ORDER — LOSARTAN POTASSIUM 25 MG PO TABS: 12.5000 mg | ORAL_TABLET | Freq: Every day | ORAL | 5 refills | Status: DC

## 2016-11-15 MED ORDER — CARVEDILOL 6.25 MG PO TABS: 3.1250 mg | ORAL_TABLET | Freq: Two times a day (BID) | ORAL | 3 refills | Status: DC

## 2016-11-15 NOTE — Telephone Encounter (Signed)
Patient also said last night his BP was 109/68, HR 72. This morning he is feeling good, no dizziness or hot flashes. He is sitting down to eat his breakfast now.

## 2016-11-15 NOTE — Telephone Encounter (Signed)
Spoke with Dr End concerning patient's BP check visit today. He would like patient to be seen in the office today if possible or first thing next week.   Called patient and he agreed to come in today at 1pm to see Eula Listen, PA.

## 2016-11-15 NOTE — Telephone Encounter (Signed)
Called and spoke with patient. Patient verbalized understanding of remote transmission results as well as Dr Serita Kyle advice. Patient will come into office today at 9:50 am for BP check and then [facility] work. Orders for labs entered.

## 2016-11-15 NOTE — Telephone Encounter (Signed)
That appt date and time is fine. No need to change. Thank you!

## 2016-11-15 NOTE — Progress Notes (Signed)
Cardiology Office Note Date:  11/15/2016  Patient ID:  Roy Palmer, DOB August 31, 1929, MRN [id number] PCP:  Gabriel Cirri, NP  Cardiologist:  Dr. Okey Dupre, MD    Chief Complaint: Fatigue  History of Present Illness: Roy Palmer is a 83 y.o. male with history of severe mitral regurgitation status post minimally invasive mitral valve repair and 03/19/2016 with left atrial appendage clipping, syncope with sustained ventricular tachycardia status post Medtronic dual chamber ICD on 03/27/2016, nonischemic cardiomyopathy with most recent EF of 20-25%, history of frequent PVCs/prior NSVT intolerant to amiodarone secondary to hallucinations, prior baseline sinus bradycardia in the 50s, hypertension, hyperlipidemia, and CKD stage II-III who presents for evaluation of fatigue with possible bradycardic heart rates.  Over the winter in early spring patient experience progressive orthopnea and edema with subsequent echocardiogram revealing decline in LV systolic function to 20-25%. At that time low-dose carvedilol and losartan were added which he tolerated relatively well. He initially noted some fatigue but states that did improve. He was last seen by Dr. end on 10/22/2016 and reported feeling quite well. His breathing was back to normal at that time and he was able to work in his garden without limitations. He denied any ICD shocks. His weight remained stable. His regimen included Lasix 40 mg daily, carvedilol 3.125 mg twice a day, and losartan 25 mg daily. He was felt to be back at his baseline in regards to breathing and energy, consistent with NYHA class I-II heart failure. His carvedilol was increased to 6.25 mg twice a day. Patient called the office on 6/14 noting he had been experiencing dizziness along with hot flashes that were increasing in frequency over the prior 2 weeks. The symptoms were worse in the morning and by lunchtime seemed to have improved but still noted intermittent episodes. Patient  initially took the increased dose of carvedilol for 2 days then self decreased back to 3.125 twice a day himself as he noted increased fatigue. BP on the day of 6/14 was noted to be 117/50 with a heart rate of 61 BPM. There were no prior readings to compare to. He performed a self transmission of his device that showed a predominant rhythm of sinus rhythm at 85 BPM. 4 monitored NSVT episodes on 4/25, 5/5, 5/15, and 5/24 with the longest being 15 beats. There were no treated episodes. No AT/AF. No alerts or abnormalities noted. ICD was functioning appropriately. Thoracic impedance stable at that time. He was advised to come in for a BP check as well as bmet, magnesium, and CBC today. This morning, patient's blood pressure was self reported at 109/68 with a heart rate of 72 BPM. He was feeling good without dizziness or hot flashes. Upon patient's arrival to the office today for BP check automated blood pressure cuff reported BP of 121/64 and pulse oximetry with a heart rate ranging between 36-45 BPM. Patient noted he felt tired and had increased fatigue. He denied chest pain, shortness of breath. He was ambulated around the office which demonstrated chronotropic competence with heart rate into the 60s bpm via pulse oximetry. His carvedilol was advised to be held. Patient was worked into the clinic this afternoon to be evaluated.  As the day has progressed he notes improvement in his symptoms of dizziness and fatigue. Patient has noted for about the prior two weeks he feels at his baseline when he gets up. Shortly after taking his medications he develops positional dizziness and increased fatigue. As the day progresses these symptoms improve  and he returns back to his baseline. He has not had any symptoms of chest pain increased SOB, palpitations, diaphoresis, nausea, vomiting, presyncope, or syncope. No ICD shocks. He denies any abdominal distension, LE swelling, orthopnea, or early satiety.   See detailed  cardiovascular history and procedures outlined below.   Past Medical History:  Diagnosis Date  . AAA (abdominal aortic aneurysm) without rupture (HCC) 03/12/2016   Small - measured 3.3 x 3.5 cm by CTA  . CKD (chronic kidney disease) stage 3, GFR 30-59 ml/min   . Essential hypertension   . Hyperlipidemia   . Hypertensive kidney disease   . Incidental pulmonary nodule 03/12/2016   Vague opacity right lung base requires radiographic follow up - index of suspicion is LOW  . Near syncope    Cardiogenic, related to an SVT and severe MR  . Non-sustained ventricular tachycardia (HCC)   . S/P minimally invasive mitral valve repair 03/19/2016   Complex valvuloplasty including triangular resection of flail segment of posterior leaflet, chordal transposition x1, artificial Gore-tex neochord placement x4 and 34 mm Sorin Memo 3D ring annuloplasty via right mini thoracotomy approach with clipping of LA appendage  . Severe mitral regurgitation by prior echocardiogram 02/23/2016   Confirmed by TEE  . Syncope 03/15/2016  . Thoracic aortic aneurysm (HCC) 03/12/2016   Very very mild fusiform enlargement of distal transverse and proximal descending thoracic aorta noted on CTA  . Trigeminal neuralgia     Past Surgical History:  Procedure Laterality Date  . CARDIAC CATHETERIZATION N/A 02/23/2016   Procedure: Right/Left Heart Cath and Coronary Angiography;  Surgeon: [name], MD;  Location: [facility] INVASIVE CV LAB;  Service: Cardiovascular: Angiographic minimal coronary disease. Normal left and right heart Pressures. Decreased CO/CI in settng of frequent PVCs & Severe MR.  Marland Kitchen CATARACT EXTRACTION, BILATERAL    . COLONOSCOPY  08/2005  . CYST EXCISION  10/2013   from neck  . EP IMPLANTABLE DEVICE N/A 03/27/2016   Procedure: ICD Implant Dual Chamber;  Surgeon: Marinus Maw, MD;  Location: [facility] INVASIVE CV LAB;  Service: Cardiovascular;  Laterality: N/A;  . ESOPHAGOGASTRODUODENOSCOPY  02/2010  . MITRAL VALVE  REPAIR Right 03/19/2016   Procedure: MINIMALLY INVASIVE MITRAL VALVE REPAIR (MVR);  Surgeon: [name], MD;  Location: [facility] OR;  Service: Open Heart Surgery;  Laterality: Right;  . TEE WITHOUT CARDIOVERSION N/A 02/23/2016   Procedure: TRANSESOPHAGEAL ECHOCARDIOGRAM (TEE);  Surgeon: Quintella Reichert, MD;  Location: [facility] ENDOSCOPY;  Service: Cardiovascular: Normal LV size and function.  Degenerative mitral valve disease with failed posterior leaflet (P2 segment), Severe MR with pulmonary vein systolic flow reversal. Moderate-TR.    . TEE WITHOUT CARDIOVERSION N/A 03/19/2016   Procedure: TRANSESOPHAGEAL ECHOCARDIOGRAM (TEE);  Surgeon: [name], MD;  Location: [facility] OR;  Service: Open Heart Surgery;  Laterality: N/A;  . TRANSTHORACIC ECHOCARDIOGRAM  02/14/2016   EF 50-55% with moderate LVH. Possible inferolateral hypokinesis. Moderate-severe MR with posterior leaflet prolapse, cannot rule out flail. Severe LA dilation. Moderate TR.    Current Outpatient Prescriptions  Medication Sig Dispense Refill  . aspirin EC 81 MG tablet Take 1 tablet (81 mg total) by mouth daily.    . furosemide (LASIX) 40 MG tablet Take 1 tablet (40 mg total) by mouth daily. (Patient taking differently: Take 20 mg by mouth daily. ) 30 tablet 5  . losartan (COZAAR) 25 MG tablet Take 1 tablet (25 mg total) by mouth daily. 30 tablet 5  . carvedilol (COREG) 6.25 MG tablet Take  1 tablet (6.25 mg total) by mouth 2 (two) times daily. (Patient not taking: Reported on 11/15/2016) 180 tablet 3   No current facility-administered medications for this visit.     Allergies:   Amiodarone and Lyrica [pregabalin]   Social History:  The patient  reports that he has never smoked. He has never used smokeless tobacco. He reports that he does not drink alcohol or use drugs.   Family History:  The patient's family history includes Dementia in his sister; Heart attack in his father; Heart disease in his father; Hypertension in his mother;  Retinoblastoma in his son; Stroke in his mother.  ROS:   Review of Systems  Constitutional: Positive for malaise/fatigue. Negative for chills, diaphoresis, fever and weight loss.  HENT: Negative for congestion.   Eyes: Negative for discharge and redness.  Respiratory: Positive for shortness of breath. Negative for cough, hemoptysis, sputum production and wheezing.   Cardiovascular: Negative for chest pain, palpitations, orthopnea, claudication, leg swelling and PND.  Gastrointestinal: Negative for abdominal pain, blood in stool, heartburn, melena, nausea and vomiting.  Genitourinary: Negative for hematuria.  Musculoskeletal: Negative for falls and myalgias.  Skin: Negative for rash.  Neurological: Positive for weakness. Negative for dizziness, tingling, tremors, sensory change, speech change, focal weakness and loss of consciousness.  Endo/Heme/Allergies: Does not bruise/bleed easily.  Psychiatric/Behavioral: Negative for substance abuse. The patient is not nervous/anxious.   All other systems reviewed and are negative.    PHYSICAL EXAM:  VS:  BP 110/60 (BP Location: Left Arm, Patient Position: Sitting, Cuff Size: Normal)   Pulse 79   Ht 5\' 11"  (1.803 m)   Wt 194 lb 8 oz (88.2 kg)   BMI 27.13 kg/m  BMI: Body mass index is 27.13 kg/m.  Physical Exam  Constitutional: He is oriented to person, place, and time. He appears well-developed and well-nourished.  HENT:  Head: Normocephalic and atraumatic.  Eyes: Right eye exhibits no discharge. Left eye exhibits no discharge.  Neck: Normal range of motion. No JVD present.  Cardiovascular: Normal rate, regular rhythm, S1 normal, S2 normal and normal heart sounds.  Exam reveals no distant heart sounds, no friction rub, no midsystolic click and no opening snap.   No murmur heard. Pulses:      Posterior tibial pulses are 2+ on the right side, and 2+ on the left side.  No abdominal distension.  Pulmonary/Chest: Effort normal and breath sounds  normal. No respiratory distress. He has no decreased breath sounds. He has no wheezes. He has no rales. He exhibits no tenderness.  Abdominal: Soft. He exhibits no distension. There is no tenderness.  Musculoskeletal: He exhibits no edema.  Neurological: He is alert and oriented to person, place, and time.  Skin: Skin is warm and dry. No cyanosis. Nails show no clubbing.  Psychiatric: He has a normal mood and affect. His speech is normal and behavior is normal. Judgment and thought content normal.     EKG:  Was ordered and interpreted by me today. Shows NSR with occasional PVCs, 76 bpm, left axis deviation, 1st degree AV block, RBBB, LVH with early repolarization (unchnaged from prior)  Recent Labs: 07/30/2016: ALT 15 09/12/2016: B Natriuretic Peptide 805.0 11/15/2016: BUN 26; Creatinine, Ser 1.54; Hemoglobin 14.7; Magnesium 2.5; Platelets 149; Potassium 4.5; Sodium 140; TSH 1.055  03/13/2016: Cholesterol 182; HDL 33; LDL Cholesterol 116; Total CHOL/HDL Ratio 5.5; Triglycerides 163; VLDL 33   Estimated Creatinine Clearance: 38 mL/min (A) (by C-G formula based on SCr of 1.54 mg/dL (H)).  Wt Readings from Last 3 Encounters:  11/15/16 194 lb 8 oz (88.2 kg)  11/15/16 191 lb (86.6 kg)  10/22/16 194 lb 8 oz (88.2 kg)     Cardiovascular History & Procedures: Cardiovascular Problems:  Severe mitral regurgitation status post repair (03/2016)  Chronic systolic heart failure secondary to nonischemic cardiomyopathy  Frequent ventricular ectopy with sustained ventricular tachycardia status post ICD  Nonobstructive coronary artery disease  Risk Factors:  Age, male gender, hypertension, and hyperlipidemia  Cath/PCI:  LHC/RHC (02/23/16): Mild, nonobstructive CAD (30% ostial LMCA, 30% ostial LAD, 30% ostial ramus, and minimal luminal irregularities of dominant LCx. Normal right heart filling pressures. Decreased Fick cardiac output (CO 3.6 L/m, CI 1.8 L/m/m) in setting of frequent PVCs and  severe MR.  CV Surgery:  Minimally invasive mitral valve repair (03/19/16, Dr. Cornelius Moras)  EP Procedures and Devices:  24-hour Holter monitor (02/27/16): Predominantly sinus rhythm with average heart rate is 63 bpm (range 49-91 bpm). Longest RR interval 1.6 seconds. Frequent PVCs noted (11% burden) including frequent couplets and bigeminal cycles. 90 runs of NSVT were noted lasting up to 9 beats the maximum rate of 134 bpm. Rare SVT and supraventricular ectopy noted.  Dual chamber ICD (03/27/16, Medtronic)  Non-Invasive Evaluation(s):  TTE (09/03/16): Moderately dilated left ventricle with LVEF less than 20% and global hypokinesis. Prior surgical repair of mitral valve is evident with moderate regurgitation. Left atrium is moderately dilated. RV size and function are normal. Moderate pulmonary hypertension noted with RVSP of 54 mmHg.  Limited TTE (03/22/16): Normal LV size with moderate LVH and LVEF of 40-45% with possible inferior hypokinesis and incoordinate septal motion. Mitral angioplasty ring in place with trivial MR. No significant mitral stenosis. Mild left atrial enlargement. Normal RV was moderately reduced systolic function. RVSP 41 mm or mercury. Elevated central venous pressure.  TTE (02/14/16): Normal LV size with moderate LVH. EF mildly reduced at 50% with possible inferolateral hypokinesis. Moderate to severe MR noted with prolapse/flail of the posterior leaflet. Severe left atrial enlargement. Moderate TR.  TEE (02/23/16): Normal LV size and wall thickness with lower normal LVEF. Trivial AI. Severe, eccentric MR with reversal of pulmonary vein flow and flail posterior leaflet. No left atrial thrombus. Lipomatous hypertrophy of the septum. Mild to moderate TR.  Limited TTE (03/22/16): Normal LV size with moderate LVH. LVEF 40-45% with possible inferior hypokinesis. Trivial MR with mild left atrial enlargement. Normal RV size with moderately reduced systolic function. Mild TR. No  pericardial effusion.   Orthostatic Vital Signs: Lying: 110/68, HR 78 bpm Sitting: 100/60, HR 54 bpm (pulse ox) Standing: 100/64, HR 76 bpm Standing x 3 min: 90/64, HR 80 bpm  Other studies reviewed: Additional studies/records reviewed today include: summarized above  ASSESSMENT AND PLAN:  1. Fatigue/dizziness: Likely in the setting of orthostasis with dropping of his SBP to 90 mmHg when standing. He has not been checking his BP at home until the past couple of days with home readings in the low 100s several hours after taking his Coreg and losartan. He typically notes his fatigue and dizziness after taking his morning medications which improves later on in the day as his medications begin to wane. Recent device interrogation from 11/14/16 unrevealing of possible etiology with normal capture. Discussed with Dr. Okey Dupre. Will decrease losartan to 12.5 mg daily. Continue Coreg 3/125 mg bid and Lasix 20 mg daily. Slightly increase fluid consumption. I suspect the heart rate readings in the office in the 30s to 40s bpm were erroneous in the  setting of using a pulse ox with occasional PVCs vs just not capturing well. Heart rate is adequate on recent device interrogation as well as on 12-lead EKG and rhythm strip in the office this afternoon.   2. Chronic systolic CHF/NICM secondary mitral valve disease: He does not appear to be volume overloaded at this time. Unable to titrate medications given the above. Continue losartan, Coreg, and Lasix as above.   3. Status post mitral valve repair: Follow via routine echo periodically.   4. Orthostatic hypotension: As above.   Disposition: F/u with End in 1 week and Dr. Ladona Ridgel in 1 month.   Current medicines are reviewed at length with the patient today.  The patient did not have any concerns regarding medicines.  Elinor Dodge PA-C 11/15/2016 1:13 PM     [facility] [address] [place], [place] [address] [phone]

## 2016-11-15 NOTE — Telephone Encounter (Signed)
Patient needs fu in 2 weeks scheduled a few days sooner on 6/27 next appt with End after this is mid July. If not ok to see early please advise will call patient .

## 2016-11-15 NOTE — Progress Notes (Signed)
  1.) Reason for visit: BP check  2.) Name of MD requesting visit: Dr End  3.) H&P: Chronic systolic heart failure secondary to nonischemic cardiomyopathy, CAD, ICD placement.  4.) ROS related to problem: Patient here today for BP check per Dr End. Patient has been experiencing dizziness, hot flashes, weakness and fatigue. This morning at home patient's BP was 106/69, HR 75 at breakfast prior to taking medications. Patient took meds prior to coming to office this morning.  In office, BP 121/64, HR 36-45. Patient complains of feeling tired and "just can't do what he needs to do." Denies chest pain, SOB, CP.   5.) Assessment and plan per MD: Asked Eula Listen, PA to review findings and patient record. He advised to have patient ambulate to assess for chronotropic competence, hold Coreg and check BMET and TSH. Dr End had also ordered labs for patient for CBC, BMP, Mg.   Patient was ambulated around the office circle 2 times. During ambulation patient's HR started in the mid-40's and then gradually increased to the 60's during the walk. Ryan notified of findings.  Ryan advised for patient to hold Coreg this weekend and allow Dr End to review findings and advise further.  Patient instructed on advice and he verbalized understanding and will go directly to [facility] [place] for lab work.

## 2016-11-15 NOTE — Patient Instructions (Addendum)
Medication Instructions:  Your physician has recommended you make the following change in your medication:  1- DECREASE Losartan to 12.5 mg (0.5 tablet) by mouth once a day. 2- TAKE Carvedilol 3.125 mg by mouth twice a day.   Labwork: none  Testing/Procedures: none  Follow-Up: Your physician recommends that you schedule a follow-up appointment in: 2 WEEKS WITH DR END.   If you need a refill on your cardiac medications before your next appointment, please call your pharmacy.

## 2016-11-15 NOTE — Patient Instructions (Signed)
Medication Instructions:  Your physician has recommended you make the following change in your medication:  1- STOP taking the Carvedilol.   Labwork: Your physician recommends that you return for lab work in: TODAY AT [facility] (CBC, BMP, Mg, TSH). - Please go to the [facility]. You will check in at the front desk to the right as you walk into the atrium. Valet Parking is offered if needed.   Testing/Procedures: none  Follow-Up: Your physician recommends that you schedule a follow-up appointment in: TO BE DETERMINED.  Please monitor your blood pressure and heart rate.   If you need a refill on your cardiac medications before your next appointment, please call your pharmacy.

## 2016-11-15 NOTE — Telephone Encounter (Signed)
Please have Roy Palmer come in for a BMP, Mg, and CBC as well as a BP check today or early next week. He should continue taking his current medications in the meantime. Thanks.

## 2016-11-27 ENCOUNTER — Encounter: Payer: Self-pay | Admitting: Internal Medicine

## 2016-11-27 ENCOUNTER — Ambulatory Visit (INDEPENDENT_AMBULATORY_CARE_PROVIDER_SITE_OTHER): Payer: Medicare Other | Admitting: Internal Medicine

## 2016-11-27 VITALS — BP 90/58 | HR 72 | Temp 98.0°F | Ht 71.0 in | Wt 192.0 lb

## 2016-11-27 DIAGNOSIS — R61 Generalized hyperhidrosis: Secondary | ICD-10-CM | POA: Diagnosis not present

## 2016-11-27 DIAGNOSIS — I5022 Chronic systolic (congestive) heart failure: Secondary | ICD-10-CM | POA: Diagnosis not present

## 2016-11-27 DIAGNOSIS — R509 Fever, unspecified: Secondary | ICD-10-CM | POA: Diagnosis not present

## 2016-11-27 DIAGNOSIS — I38 Endocarditis, valve unspecified: Secondary | ICD-10-CM | POA: Diagnosis not present

## 2016-11-27 DIAGNOSIS — I472 Ventricular tachycardia, unspecified: Secondary | ICD-10-CM

## 2016-11-27 DIAGNOSIS — I10 Essential (primary) hypertension: Secondary | ICD-10-CM | POA: Diagnosis not present

## 2016-11-27 NOTE — Progress Notes (Signed)
Follow-up Outpatient Visit Date: 11/27/2016  Chief Complaint: Follow-up edema and shortness of breath in the setting of mitral regurgitation and nonischemic cardiomyopathy  HPI:  Roy Palmer is a 83 y.o. year-old male with history of severe mitral regurgitation status post mitral valve repair (03/2016), ventricular tachycardia status post ICD (03/2016), nonischemic cardiomyopathy, hypertension, hyperlipidemia, and chronic kidney disease, who presents for follow-up. I last saw him on 10/22/16, which time we agreed to increase carvedilol to 6.25 mg twice a day. Unfortunately, he began noticing fatigue and lightheadedness, prompting urgent visit with Eula Listen on 11/15/16. Labs were unrevealing. However, due to his symptoms, both carvedilol and losartan were decreased to 6.25 mg twice a day and 12.5 mg daily, respectively.  Today, Roy Palmer reports feeling well. His lightheadedness and fatigue have resolved. He has not had any chest pain, shortness of breath, palpitations, lightheadedness, orthopnea, PND, or edema. He is currently taking furosemide 20 mg daily. His weight has been stable. His only complaint today is of intermittent episodes of feeling hot and sweaty. They happen several times throughout the day for no reason. He has not taken his temperature. The episodes began about 3 weeks ago, even before he sought [employer]. However, he did not mention at that time. He has not had any rash or skin lesions.  --------------------------------------------------------------------------------------------------  Cardiovascular History & Procedures: Cardiovascular Problems:  Severe mitral regurgitation status post repair (03/2016)  Chronic systolic heart failure secondary to nonischemic cardiomyopathy  Frequent ventricular ectopy with sustained ventricular tachycardia status post ICD  Nonobstructive coronary artery disease  Risk Factors:  Age, male gender, hypertension, and  hyperlipidemia  Cath/PCI:  LHC/RHC (02/23/16): Mild, nonobstructive CAD (30% ostial LMCA, 30% ostial LAD, 30% ostial ramus, and minimal luminal irregularities of dominant LCx. Normal right heart filling pressures. Decreased Fick cardiac output (CO 3.6 L/m, CI 1.8 L/m/m) in setting of frequent PVCs and severe MR.  CV Surgery:  Minimally invasive mitral valve repair (03/19/16, Dr. Cornelius Moras)  EP Procedures and Devices:  24-hour Holter monitor (02/27/16): Predominantly sinus rhythm with average heart rate is 63 bpm (range 49-91 bpm). Longest RR interval 1.6 seconds. Frequent PVCs noted (11% burden) including frequent couplets and bigeminal cycles. 90 runs of NSVT were noted lasting up to 9 beats the maximum rate of 134 bpm. Rare SVT and supraventricular ectopy noted.  Dual chamber ICD (03/27/16, Medtronic)  Non-Invasive Evaluation(s):  TTE (09/03/16): Moderately dilated left ventricle with LVEF less than 20% and global hypokinesis. Prior surgical repair of mitral valve is evident with moderate regurgitation. Left atrium is moderately dilated. RV size and function are normal. Moderate pulmonary hypertension noted with RVSP of 54 mmHg.  Limited TTE (03/22/16): Normal LV size with moderate LVH and LVEF of 40-45% with possible inferior hypokinesis and incoordinate septal motion. Mitral angioplasty ring in place with trivial MR. No significant mitral stenosis. Mild left atrial enlargement. Normal RV was moderately reduced systolic function. RVSP 41 mm or mercury. Elevated central venous pressure.  TTE (02/14/16): Normal LV size with moderate LVH. EF mildly reduced at 50% with possible inferolateral hypokinesis. Moderate to severe MR noted with prolapse/flail of the posterior leaflet. Severe left atrial enlargement. Moderate TR.  TEE (02/23/16): Normal LV size and wall thickness with lower normal LVEF. Trivial AI. Severe, eccentric MR with reversal of pulmonary vein flow and flail posterior leaflet. No left  atrial thrombus. Lipomatous hypertrophy of the septum. Mild to moderate TR.  Limited TTE (03/22/16): Normal LV size with moderate LVH. LVEF 40-45% with possible inferior  hypokinesis. Trivial MR with mild left atrial enlargement. Normal RV size with moderately reduced systolic function. Mild TR. No pericardial effusion.  Recent CV Pertinent Labs: Lab Results  Component Value Date   CHOL 182 03/13/2016   CHOL 188 02/14/2016   HDL 33 (L) 03/13/2016   HDL 36 (L) 02/14/2016   LDLCALC 116 (H) 03/13/2016   LDLCALC 127 (H) 02/14/2016   TRIG 163 (H) 03/13/2016   CHOLHDL 5.5 03/13/2016   INR 3.8 06/05/2016   INR 2.45 03/29/2016   BNP 805.0 (H) 09/12/2016   K 4.5 11/15/2016   MG 2.5 (H) 11/15/2016   BUN 26 (H) 11/15/2016   BUN 27 09/18/2016   CREATININE 1.54 (H) 11/15/2016   Past medical and surgical history were reviewed and updated in EPIC.  Outpatient Encounter Prescriptions as of 11/27/2016  Medication Sig  . aspirin EC 81 MG tablet Take 1 tablet (81 mg total) by mouth daily.  . carvedilol (COREG) 6.25 MG tablet Take 0.5 tablets (3.125 mg total) by mouth 2 (two) times daily.  . furosemide (LASIX) 40 MG tablet Take 1 tablet (40 mg total) by mouth daily. (Patient taking differently: Take 20 mg by mouth daily. )  . losartan (COZAAR) 25 MG tablet Take 0.5 tablets (12.5 mg total) by mouth daily.   No facility-administered encounter medications on file as of 11/27/2016.     Allergies: Amiodarone and Lyrica [pregabalin]  Social History   Social History  . Marital status: Married    Spouse name: N/A  . Number of children: N/A  . Years of education: N/A   Occupational History  . Not on file.   Social History Main Topics  . Smoking status: Never Smoker  . Smokeless tobacco: Never Used  . Alcohol use No  . Drug use: No  . Sexual activity: Not on file   Other Topics Concern  . Not on file   Social History Narrative  . No narrative on file    Family History  Problem Relation  Age of Onset  . Hypertension Mother   . Stroke Mother   . Heart disease Father   . Heart attack Father   . Dementia Sister   . Retinoblastoma Son   . COPD Neg Hx   . Cancer Neg Hx   . Diabetes Neg Hx     Review of Systems: A 12-system review of systems was performed and was negative except as noted in the HPI.  --------------------------------------------------------------------------------------------------  Physical Exam: BP (!) 90/58 (BP Location: Left Arm, Patient Position: Sitting, Cuff Size: Normal)   Pulse 72   Temp 98 F (36.7 C)   Ht 5\' 11"  (1.803 m)   Wt 192 lb (87.1 kg)   BMI 26.78 kg/m   General:  Overweight, elderly man, seated comfortably in the exam room. HEENT: No conjunctival pallor or scleral icterus.  Moist mucous membranes.  OP clear. Neck: Supple without lymphadenopathy, thyromegaly, JVD, or HJR.  No carotid bruit. Lungs: Normal work of breathing.  Clear to auscultation bilaterally without wheezes or crackles. Heart: Regular rate and rhythm with occasional extrasystoles. 1/6 systolic murmur noted at the left lower sternal border. No rubs or gallops. Non-displaced PMI. Abd: Bowel sounds present.  Soft, NT/ND without hepatosplenomegaly Ext: No lower extremity edema.  Radial, PT, and DP pulses are 2+ bilaterally. Skin: Warm and dry without rash. Left chest ICD incision is well-healed.  EKG: Normal sinus rhythm with single PVC and occasional atrially paced beats. First degree AV block. Left axis deviation  and right bundle branch block.  Lab Results  Component Value Date   WBC 6.7 11/15/2016   HGB 14.7 11/15/2016   HCT 43.7 11/15/2016   MCV 90.5 11/15/2016   PLT 149 (L) 11/15/2016    Lab Results  Component Value Date   NA 140 11/15/2016   K 4.5 11/15/2016   CL 108 11/15/2016   CO2 26 11/15/2016   BUN 26 (H) 11/15/2016   CREATININE 1.54 (H) 11/15/2016   GLUCOSE 97 11/15/2016   ALT 15 07/30/2016    Lab Results  Component Value Date   CHOL 182  03/13/2016   HDL 33 (L) 03/13/2016   LDLCALC 116 (H) 03/13/2016   TRIG 163 (H) 03/13/2016   CHOLHDL 5.5 03/13/2016   BNP (1/61/09): 805 (previously 111 on 03/12/16)  --------------------------------------------------------------------------------------------------  ASSESSMENT AND PLAN: Chronic systolic heart failure secondary to nonischemic cardiomyopathy related to valvular heart disease Mr. Kovalcik appears euvolemic and well compensated with NYHA class II heart failure symptoms. His previously noted lightheadedness and fatigue have resolved with de-escalation of his heart failure therapy. Unfortunately, it appears that he is only able to tolerate very low doses of beta blockade and ARB. We will continue his current medications, including his current dose of furosemide. We will discuss repeating a limited echo at our next visit. If his EF remains severely reduced and he develops symptoms of decompensated heart failure again in the future, we will need to consider referral to the advanced heart failure clinic.  Ventricular tachycardia A single PVC is noted on today's EKG. Mr. Paolo has not had any symptoms suggestive stated arrhythmias. He denies ICD shocks. He should continue scheduled follow-up with Dr. Ladona Ridgel and the device clinic.  Hypertension Blood pressure is low normal, though Mr. Herberger is asymptomatic. I will not make any medication changes today.  Diaphoresis and subjective fevers Patient is afebrile today. No obvious infection. Labs obtained less than 2 weeks ago (one symptoms had already started) were unrevealing, including normal CBC with differential and TSH. I asked Mr. Mabile to purchase a thermometer and check his temperature when he has subjective fevers and sweats. He should contact us immediately if he develops a fever or has any other constitutional symptoms.  Follow-up: Return to clinic on 12/17/16.  [name], MD 11/27/2016 11:46 AM

## 2016-11-27 NOTE — Patient Instructions (Signed)
Medication Instructions:  Your physician recommends that you continue on your current medications as directed. Please refer to the Current Medication list given to you today.   Labwork: none  Testing/Procedures: none  Follow-Up: Your physician recommends that you schedule a follow-up appointment in: with DR END ON 12/17/16 IN THE AFTERNOON PER DR END.   Any Other Special Instructions Will Be Listed Below (If Applicable).  Dr End recommends you get a thermometer for home use. Please take your temperature whenever you have a "hot spell." If it is great than 100.5, then please give our office a call, [phone].   If you need a refill on your cardiac medications before your next appointment, please call your pharmacy.

## 2016-12-16 ENCOUNTER — Ambulatory Visit (INDEPENDENT_AMBULATORY_CARE_PROVIDER_SITE_OTHER): Payer: Medicare Other | Admitting: *Deleted

## 2016-12-16 DIAGNOSIS — I428 Other cardiomyopathies: Secondary | ICD-10-CM

## 2016-12-16 DIAGNOSIS — I5022 Chronic systolic (congestive) heart failure: Secondary | ICD-10-CM

## 2016-12-16 DIAGNOSIS — I472 Ventricular tachycardia, unspecified: Secondary | ICD-10-CM

## 2016-12-16 NOTE — Progress Notes (Signed)
Remote ICD transmission.   

## 2016-12-17 ENCOUNTER — Encounter: Payer: Self-pay | Admitting: Internal Medicine

## 2016-12-17 ENCOUNTER — Ambulatory Visit (INDEPENDENT_AMBULATORY_CARE_PROVIDER_SITE_OTHER): Payer: Medicare Other | Admitting: Internal Medicine

## 2016-12-17 VITALS — BP 126/62 | HR 73 | Ht 71.0 in | Wt 193.5 lb

## 2016-12-17 DIAGNOSIS — I472 Ventricular tachycardia, unspecified: Secondary | ICD-10-CM

## 2016-12-17 DIAGNOSIS — I714 Abdominal aortic aneurysm, without rupture, unspecified: Secondary | ICD-10-CM

## 2016-12-17 DIAGNOSIS — I428 Other cardiomyopathies: Secondary | ICD-10-CM

## 2016-12-17 DIAGNOSIS — I712 Thoracic aortic aneurysm, without rupture, unspecified: Secondary | ICD-10-CM

## 2016-12-17 DIAGNOSIS — I5022 Chronic systolic (congestive) heart failure: Secondary | ICD-10-CM | POA: Diagnosis not present

## 2016-12-17 DIAGNOSIS — I340 Nonrheumatic mitral (valve) insufficiency: Secondary | ICD-10-CM

## 2016-12-17 DIAGNOSIS — Z9889 Other specified postprocedural states: Secondary | ICD-10-CM | POA: Diagnosis not present

## 2016-12-17 LAB — CUP PACEART REMOTE DEVICE CHECK
Battery Remaining Longevity: 128 mo
Battery Voltage: 3.05 V
Brady Statistic AP VP Percent: 0.01 %
Brady Statistic AP VS Percent: 1.69 %
Brady Statistic AS VP Percent: 0.06 %
Brady Statistic AS VS Percent: 98.24 %
Brady Statistic RA Percent Paced: 1.68 %
Brady Statistic RV Percent Paced: 0.07 %
Date Time Interrogation Session: 20180716052206
HighPow Impedance: 70 Ohm
Implantable Lead Implant Date: 20171025
Implantable Lead Implant Date: 20171025
Implantable Lead Location: 753859
Implantable Lead Location: 753860
Implantable Lead Model: 5076
Implantable Pulse Generator Implant Date: 20171025
Lead Channel Impedance Value: 399 Ohm
Lead Channel Impedance Value: 456 Ohm
Lead Channel Impedance Value: 475 Ohm
Lead Channel Pacing Threshold Amplitude: 0.625 V
Lead Channel Pacing Threshold Amplitude: 0.75 V
Lead Channel Pacing Threshold Pulse Width: 0.4 ms
Lead Channel Pacing Threshold Pulse Width: 0.4 ms
Lead Channel Sensing Intrinsic Amplitude: 3.125 mV
Lead Channel Sensing Intrinsic Amplitude: 3.125 mV
Lead Channel Sensing Intrinsic Amplitude: 4.625 mV
Lead Channel Sensing Intrinsic Amplitude: 4.625 mV
Lead Channel Setting Pacing Amplitude: 2 V
Lead Channel Setting Pacing Amplitude: 2.5 V
Lead Channel Setting Pacing Pulse Width: 0.4 ms
Lead Channel Setting Sensing Sensitivity: 0.3 mV

## 2016-12-17 NOTE — Patient Instructions (Signed)
Medication Instructions:  Your physician recommends that you continue on your current medications as directed. Please refer to the Current Medication list given to you today.   Labwork: none  Testing/Procedures: Your physician has requested that you have an LIMITED echocardiogram. Echocardiography is a painless test that uses sound waves to create images of your heart. It provides your doctor with information about the size and shape of your heart and how well your heart's chambers and valves are working. This procedure takes approximately one hour. There are no restrictions for this procedure. - SCHEDULED TO BE DONE IN 6-7 WEEKS.   Follow-Up: Your physician recommends that you schedule a follow-up appointment in: 6-8 WEEKS WITH DR END AFTER LIMITED ECHO HAS BEEN DONE.  If you need a refill on your cardiac medications before your next appointment, please call your pharmacy.   Echocardiogram An echocardiogram, or echocardiography, uses sound waves (ultrasound) to produce an image of your heart. The echocardiogram is simple, painless, obtained within a short period of time, and offers valuable information to your health care provider. The images from an echocardiogram can provide information such as:  Evidence of coronary artery disease (CAD).  Heart size.  Heart muscle function.  Heart valve function.  Aneurysm detection.  Evidence of a past heart attack.  Fluid buildup around the heart.  Heart muscle thickening.  Assess heart valve function.  Tell a health care provider about:  Any allergies you have.  All medicines you are taking, including vitamins, herbs, eye drops, creams, and over-the-counter medicines.  Any problems you or family members have had with anesthetic medicines.  Any blood disorders you have.  Any surgeries you have had.  Any medical conditions you have.  Whether you are pregnant or may be pregnant. What happens before the procedure? No special  preparation is needed. Eat and drink normally. What happens during the procedure?  In order to produce an image of your heart, gel will be applied to your chest and a wand-like tool (transducer) will be moved over your chest. The gel will help transmit the sound waves from the transducer. The sound waves will harmlessly bounce off your heart to allow the heart images to be captured in real-time motion. These images will then be recorded.  You may need an IV to receive a medicine that improves the quality of the pictures. What happens after the procedure? You may return to your normal schedule including diet, activities, and medicines, unless your health care provider tells you otherwise. This information is not intended to replace advice given to you by your health care provider. Make sure you discuss any questions you have with your health care provider. Document Released: 05/17/2000 Document Revised: 01/06/2016 Document Reviewed: 01/25/2013 Elsevier Interactive Patient Education  2017 [employer].

## 2016-12-17 NOTE — Progress Notes (Signed)
Follow-up Outpatient Visit Date: 12/17/2016  Primary Care Provider: Gabriel Cirri, NP [address] [place] [address]  Chief Complaint: Follow-up chronic systolic heart failure and mitral regurgitation  HPI:  Roy Palmer is a 83 y.o. year-old male with history of severe mitral regurgitation status post mitral valve repair (03/2016), jugular tachycardia status post ICD (03/2016), nonischemic cardiomyopathy, nonobstructive CAD, hypertension, hyperlipidemia, chronic kidney disease, who presents for follow-up of chronic systolic heart failure. I last saw Mr. Colden 3 weeks ago, which time he was doing well. Prior to this visit, he had developed lightheadedness prompting de-escalation of his evidence-based heart failure therapy. Today, Roy Palmer reports feeling well. He denies shortness of breath, edema, orthopnea, chest pain, and palpitations. He has not had any ICD shocks. Device interrogation yesterday showed 6 beats of NSVT.  --------------------------------------------------------------------------------------------------  Cardiovascular History & Procedures: Cardiovascular Problems:  Severe mitral regurgitation status post repair (03/2016)  Chronic systolic heart failure secondary to nonischemic cardiomyopathy  Frequent ventricular ectopy with sustained ventricular tachycardia status post ICD  Nonobstructive coronary artery disease  Risk Factors:  Age, male gender, hypertension, and hyperlipidemia  Cath/PCI:  LHC/RHC (02/23/16): Mild, nonobstructive CAD (30% ostial LMCA, 30% ostial LAD, 30% ostial ramus, and minimal luminal irregularities of dominant LCx. Normal right heart filling pressures. Decreased Fick cardiac output (CO 3.6 L/m, CI 1.8 L/m/m) in setting of frequent PVCs and severe MR.  CV Surgery:  Minimally invasive mitral valve repair (03/19/16, Dr. Cornelius Moras)  EP Procedures and Devices:  24-hour Holter monitor (02/27/16): Predominantly sinus rhythm with average  heart rate is 63 bpm (range 49-91 bpm). Longest RR interval 1.6 seconds. Frequent PVCs noted (11% burden) including frequent couplets and bigeminal cycles. 90 runs of NSVT were noted lasting up to 9 beats the maximum rate of 134 bpm. Rare SVT and supraventricular ectopy noted.  Dual chamber ICD (03/27/16, [employer]tronic)  Non-Invasive Evaluation(s):  TTE (09/03/16): Moderately dilated left ventricle with LVEF less than 20% and global hypokinesis. Prior surgical repair of mitral valve is evident with moderate regurgitation. Left atrium is moderately dilated. RV size and function are normal. Moderate pulmonary hypertension noted with RVSP of 54 mmHg.  Limited TTE (03/22/16): Normal LV size with moderate LVH and LVEF of 40-45% with possible inferior hypokinesis and incoordinate septal motion. Mitral angioplasty ring in place with trivial MR. No significant mitral stenosis. Mild left atrial enlargement. Normal RV was moderately reduced systolic function. RVSP 41 mm or mercury. Elevated central venous pressure.  TTE (02/14/16): Normal LV size with moderate LVH. EF mildly reduced at 50% with possible inferolateral hypokinesis. Moderate to severe MR noted with prolapse/flail of the posterior leaflet. Severe left atrial enlargement. Moderate TR.  TEE (02/23/16): Normal LV size and wall thickness with lower normal LVEF. Trivial AI. Severe, eccentric MR with reversal of pulmonary vein flow and flail posterior leaflet. No left atrial thrombus. Lipomatous hypertrophy of the septum. Mild to moderate TR.  Limited TTE (03/22/16): Normal LV size with moderate LVH. LVEF 40-45% with possible inferior hypokinesis. Trivial MR with mild left atrial enlargement. Normal RV size with moderately reduced systolic function. Mild TR. No pericardial effusion.   Recent CV Pertinent Labs: Lab Results  Component Value Date   CHOL 182 03/13/2016   CHOL 188 02/14/2016   HDL 33 (L) 03/13/2016   HDL 36 (L) 02/14/2016   LDLCALC 116 (H)  03/13/2016   LDLCALC 127 (H) 02/14/2016   TRIG 163 (H) 03/13/2016   CHOLHDL 5.5 03/13/2016   INR 3.8 06/05/2016   INR 2.45  03/29/2016   BNP 805.0 (H) 09/12/2016   K 4.5 11/15/2016   MG 2.5 (H) 11/15/2016   BUN 26 (H) 11/15/2016   BUN 27 09/18/2016   CREATININE 1.54 (H) 11/15/2016    Past medical and surgical history were reviewed and updated in EPIC.  Current Meds  Medication Sig  . aspirin EC 81 MG tablet Take 1 tablet (81 mg total) by mouth daily.  . carvedilol (COREG) 6.25 MG tablet Take 0.5 tablets (3.125 mg total) by mouth 2 (two) times daily.  . furosemide (LASIX) 40 MG tablet Take 1 tablet (40 mg total) by mouth daily. (Patient taking differently: Take 20 mg by mouth daily. )  . losartan (COZAAR) 25 MG tablet Take 0.5 tablets (12.5 mg total) by mouth daily.    Allergies: Amiodarone and Lyrica [pregabalin]  Social History   Social History  . Marital status: Married    Spouse name: N/A  . Number of children: N/A  . Years of education: N/A   Occupational History  . Not on file.   Social History Main Topics  . Smoking status: Never Smoker  . Smokeless tobacco: Never Used  . Alcohol use No  . Drug use: No  . Sexual activity: Not on file   Other Topics Concern  . Not on file   Social History Narrative  . No narrative on file    Family History  Problem Relation Age of Onset  . Hypertension Mother   . Stroke Mother   . Heart disease Father   . Heart attack Father   . Dementia Sister   . Retinoblastoma Son   . COPD Neg Hx   . Cancer Neg Hx   . Diabetes Neg Hx     Review of Systems: A 12-system review of systems was performed and was negative except as noted in the HPI.  --------------------------------------------------------------------------------------------------  Physical Exam: BP 126/62 (BP Location: Left Arm, Patient Position: Sitting, Cuff Size: Normal)   Pulse 73   Ht 5\' 11"  (1.803 m)   Wt 87.8 kg (193 lb 8 oz)   BMI 26.99 kg/m    General:  Overweight, elderly man, seated comfortably in the exam room. HEENT: No conjunctival pallor or scleral icterus. Moist mucous membranes.  OP clear. Neck: Supple without lymphadenopathy, thyromegaly, JVD, or HJR. No carotid bruit. Lungs: Normal work of breathing. Clear to auscultation bilaterally without wheezes or crackles. Heart: Regular rate and rhythm with occasional extrasystoles and 1/6 systolic murmur. No rubs or gallops. Non-displaced PMI. Abd: Bowel sounds present. Soft, NT/ND without hepatosplenomegaly Ext: No lower extremity edema. Radial, PT, and DP pulses are 2+ bilaterally. Skin: Warm and dry without rash.  EKG:  Normal sinus rhythm with occasional PACs and PVCs. Left axis deviation. Right bundle branch block.  Lab Results  Component Value Date   WBC 6.7 11/15/2016   HGB 14.7 11/15/2016   HCT 43.7 11/15/2016   MCV 90.5 11/15/2016   PLT 149 (L) 11/15/2016    Lab Results  Component Value Date   NA 140 11/15/2016   K 4.5 11/15/2016   CL 108 11/15/2016   CO2 26 11/15/2016   BUN 26 (H) 11/15/2016   CREATININE 1.54 (H) 11/15/2016   GLUCOSE 97 11/15/2016   ALT 15 07/30/2016    Lab Results  Component Value Date   CHOL 182 03/13/2016   HDL 33 (L) 03/13/2016   LDLCALC 116 (H) 03/13/2016   TRIG 163 (H) 03/13/2016   CHOLHDL 5.5 03/13/2016    --------------------------------------------------------------------------------------------------  ASSESSMENT AND PLAN: Chronic systolic heart failure secondary to nonischemic cardiomyopathy Mr. Giza appears euvolemic and well compensated with NYHA class I-II symptoms. Unfortunately, he has been intolerant of uptitration of his evidence-based heart failure therapy in the past. We discussed adding spironolactone, but given that he feels so well at this time, Mr. Kitz would like to defer this. We have agreed to repeat a limited echocardiogram in 6-8 weeks to reassess his LV function, which had become severely  reduced this spring. We will follow-up shortly thereafter.  Ventricular tachycardia No ICD shocks or palpitations. Recent and SVT noted on recent device interrogation. We will continue current dose of carvedilol. Mr. Janiga should continue follow up with Dr. Ladona Ridgel in the device clinic, as previously arranged.  Mitral regurgitation status post mitral valve repair As above, no symptoms of decompensated heart failure reported. Continue current medications, including low-dose aspirin.  Thoracic and abdominal aortic aneurysms Modest dilation of distal aortic arch and infrarenal abdominal aorta (both measuring up to 3.5 cm) noted by CT in October. We will readdress annual follow-up one Mr. Gilhooley returns to see me.  Follow-up: Return to clinic in 2 months (after completion of echo).  [name], MD 12/17/2016 3:02 PM

## 2016-12-18 ENCOUNTER — Encounter: Payer: Self-pay | Admitting: Cardiology

## 2017-01-01 ENCOUNTER — Encounter: Payer: Self-pay | Admitting: Cardiology

## 2017-01-16 ENCOUNTER — Telehealth: Payer: Self-pay | Admitting: Cardiology

## 2017-01-16 NOTE — Telephone Encounter (Signed)
Spoke with patient's wife.  She requests that we reschedule patient's remote transmission as they will be away on vacation through 10/20.  Patient's wife is agreeable to rescheduling to 10/22.  She is appreciative of assistance and denies additional questions or concerns at this time.

## 2017-01-16 NOTE — Telephone Encounter (Signed)
New Message:   Pt wants to change his remote appointment please.

## 2017-01-22 ENCOUNTER — Ambulatory Visit: Payer: Medicare Other | Admitting: Internal Medicine

## 2017-02-11 ENCOUNTER — Ambulatory Visit (INDEPENDENT_AMBULATORY_CARE_PROVIDER_SITE_OTHER): Payer: Medicare Other

## 2017-02-11 ENCOUNTER — Other Ambulatory Visit: Payer: Self-pay

## 2017-02-11 DIAGNOSIS — I5022 Chronic systolic (congestive) heart failure: Secondary | ICD-10-CM | POA: Diagnosis not present

## 2017-02-11 DIAGNOSIS — I340 Nonrheumatic mitral (valve) insufficiency: Secondary | ICD-10-CM | POA: Diagnosis not present

## 2017-02-19 ENCOUNTER — Ambulatory Visit (INDEPENDENT_AMBULATORY_CARE_PROVIDER_SITE_OTHER): Payer: Medicare Other | Admitting: Internal Medicine

## 2017-02-19 ENCOUNTER — Encounter: Payer: Self-pay | Admitting: Internal Medicine

## 2017-02-19 VITALS — BP 122/64 | HR 68 | Ht 71.0 in | Wt 196.5 lb

## 2017-02-19 DIAGNOSIS — I38 Endocarditis, valve unspecified: Secondary | ICD-10-CM | POA: Diagnosis not present

## 2017-02-19 DIAGNOSIS — I340 Nonrheumatic mitral (valve) insufficiency: Secondary | ICD-10-CM

## 2017-02-19 DIAGNOSIS — Z79899 Other long term (current) drug therapy: Secondary | ICD-10-CM | POA: Diagnosis not present

## 2017-02-19 DIAGNOSIS — I2510 Atherosclerotic heart disease of native coronary artery without angina pectoris: Secondary | ICD-10-CM

## 2017-02-19 DIAGNOSIS — I5022 Chronic systolic (congestive) heart failure: Secondary | ICD-10-CM

## 2017-02-19 DIAGNOSIS — Z9889 Other specified postprocedural states: Secondary | ICD-10-CM

## 2017-02-19 DIAGNOSIS — Z23 Encounter for immunization: Secondary | ICD-10-CM | POA: Diagnosis not present

## 2017-02-19 NOTE — Progress Notes (Signed)
Follow-up Outpatient Visit Date: 02/19/2017  Primary Care Provider: Gabriel Cirri, NP [address] [place] [address]  Chief Complaint: Follow-up chronic systolic heart failure and mitral regurgitation  HPI:  Roy Palmer is a 83 y.o. year-old male with history of severe mitral regurgitation status post mitral valve repair (03/2016), jugular tachycardia status post ICD (03/2016), nonischemic cardiomyopathy, nonobstructive CAD, hypertension, hyperlipidemia, chronic kidney disease, who presents for follow-up of heart failure. I last saw Roy Palmer in July, at which time he was doing well. We subsequently obtained a limited echo which showed continued severe LV dysfunction.  Today, Roy Palmer reports that he has been feeling well. He remains active around the house and in his garden. He denies chest pain, shortness of breath, palpitations, orthopnea, and PND. He notes one brief episode of orthostatic lightheadedness. Home blood pressures have typically been in the one teens or higher. He is tolerating low-dose losartan and carvedilol well. He denies ICD shocks.  --------------------------------------------------------------------------------------------------  Cardiovascular History & Procedures: Cardiovascular Problems:  Severe mitral regurgitation status post repair (03/2016)  Chronic systolic heart failure secondary to nonischemic cardiomyopathy  Frequent ventricular ectopy with sustained ventricular tachycardia status post ICD  Nonobstructive coronary artery disease  Risk Factors:  Age, male gender, hypertension, and hyperlipidemia  Cath/PCI:  LHC/RHC (02/23/16): Mild, nonobstructive CAD (30% ostial LMCA, 30% ostial LAD, 30% ostial ramus, and minimal luminal irregularities of dominant LCx. Normal right heart filling pressures. Decreased Fick cardiac output (CO 3.6 L/m, CI 1.8 L/m/m) in setting of frequent PVCs and severe Roy.  CV Surgery:  Minimally invasive mitral valve  repair (03/19/16, Dr. Cornelius Moras)  EP Procedures and Devices:  24-hour Holter monitor (02/27/16): Predominantly sinus rhythm with average heart rate is 63 bpm (range 49-91 bpm). Longest RR interval 1.6 seconds. Frequent PVCs noted (11% burden) including frequent couplets and bigeminal cycles. 90 runs of NSVT were noted lasting up to 9 beats the maximum rate of 134 bpm. Rare SVT and supraventricular ectopy noted.  Dual chamber ICD (03/27/16, [employer])  Non-Invasive Evaluation(s):  TTE (09/03/16): Moderately dilated left ventricle with LVEF less than 20% and global hypokinesis. Prior surgical repair of mitral valve is evident with moderate regurgitation. Left atrium is moderately dilated. RV size and function are normal. Moderate pulmonary hypertension noted with RVSP of 54 mmHg.  Limited TTE (03/22/16): Normal LV size with moderate LVH and LVEF of 40-45% with possible inferior hypokinesis and incoordinate septal motion. Mitral angioplasty ring in place with trivial Roy. No significant mitral stenosis. Mild left atrial enlargement. Normal RV was moderately reduced systolic function. RVSP 41 mm or mercury. Elevated central venous pressure.  TTE (02/14/16): Normal LV size with moderate LVH. EF mildly reduced at 50% with possible inferolateral hypokinesis. Moderate to severe Roy noted with prolapse/flail of the posterior leaflet. Severe left atrial enlargement. Moderate TR.  TEE (02/23/16): Normal LV size and wall thickness with lower normal LVEF. Trivial AI. Severe, eccentric Roy with reversal of pulmonary vein flow and flail posterior leaflet. No left atrial thrombus. Lipomatous hypertrophy of the septum. Mild to moderate TR.  Limited TTE (03/22/16): Normal LV size with moderate LVH. LVEF 40-45% with possible inferior hypokinesis. Trivial Roy with mild left atrial enlargement. Normal RV size with moderately reduced systolic function. Mild TR. No pericardial effusion.  Recent CV Pertinent Labs: Lab Results    Component Value Date   CHOL 182 03/13/2016   CHOL 188 02/14/2016   HDL 33 (L) 03/13/2016   HDL 36 (L) 02/14/2016   LDLCALC 116 (H) 03/13/2016  LDLCALC 127 (H) 02/14/2016   TRIG 163 (H) 03/13/2016   CHOLHDL 5.5 03/13/2016   INR 3.8 06/05/2016   INR 2.45 03/29/2016   BNP 805.0 (H) 09/12/2016   K 4.5 11/15/2016   MG 2.5 (H) 11/15/2016   BUN 26 (H) 11/15/2016   BUN 27 09/18/2016   CREATININE 1.54 (H) 11/15/2016    Past medical and surgical history were reviewed and updated in EPIC.  Current Meds  Medication Sig  . aspirin EC 81 MG tablet Take 1 tablet (81 mg total) by mouth daily.  . carvedilol (COREG) 6.25 MG tablet Take 0.5 tablets (3.125 mg total) by mouth 2 (two) times daily.  . furosemide (LASIX) 40 MG tablet Take 1 tablet (40 mg total) by mouth daily. (Patient taking differently: Take 20 mg by mouth daily. )  . losartan (COZAAR) 25 MG tablet Take 0.5 tablets (12.5 mg total) by mouth daily.    Allergies: Amiodarone and Lyrica [pregabalin]  Social History   Social History  . Marital status: Married    Spouse name: N/A  . Number of children: N/A  . Years of education: N/A   Occupational History  . Not on file.   Social History Main Topics  . Smoking status: Never Smoker  . Smokeless tobacco: Never Used  . Alcohol use No  . Drug use: No  . Sexual activity: Not on file   Other Topics Concern  . Not on file   Social History Narrative  . No narrative on file    Family History  Problem Relation Age of Onset  . Hypertension Mother   . Stroke Mother   . Heart disease Father   . Heart attack Father   . Dementia Sister   . Retinoblastoma Son   . COPD Neg Hx   . Cancer Neg Hx   . Diabetes Neg Hx     Review of Systems: A 12-system review of systems was performed and was negative except as noted in the HPI.  --------------------------------------------------------------------------------------------------  Physical Exam: BP 122/64 (BP Location: Left  Arm, Patient Position: Sitting, Cuff Size: Normal)   Pulse 68   Ht  (1.803 m)   Wt 196 lb 8 oz (89.1 kg)   BMI 27.41 kg/m   General:  Overweight, elderly man, seated comfortably in the exam room. HEENT: No conjunctival pallor or scleral icterus. Moist mucous membranes.  OP clear. Neck: Supple without lymphadenopathy, thyromegaly, JVD, or HJR. Lungs: Normal work of breathing. Clear to auscultation bilaterally without wheezes or crackles. Heart: Regular rate and rhythm with a 1/6 holosystolic murmur loudest at the apex. No rubs or gallops. Non-displaced PMI. Abd: Bowel sounds present. Soft, NT/ND without hepatosplenomegaly Ext: No lower extremity edema. Radial, PT, and DP pulses are 2+ bilaterally. Skin: Warm and dry without rash.  EKG:  Normal sinus rhythm with left axis deviation, right bundle branch block, and LVH.  Lab Results  Component Value Date   WBC 6.7 11/15/2016   HGB 14.7 11/15/2016   HCT 43.7 11/15/2016   MCV 90.5 11/15/2016   PLT 149 (L) 11/15/2016    Lab Results  Component Value Date   NA 140 11/15/2016   K 4.5 11/15/2016   CL 108 11/15/2016   CO2 26 11/15/2016   BUN 26 (H) 11/15/2016   CREATININE 1.54 (H) 11/15/2016   GLUCOSE 97 11/15/2016   ALT 15 07/30/2016    Lab Results  Component Value Date   CHOL 182 03/13/2016   HDL 33 (L) 03/13/2016  LDLCALC 116 (H) 03/13/2016   TRIG 163 (H) 03/13/2016   CHOLHDL 5.5 03/13/2016    --------------------------------------------------------------------------------------------------  ASSESSMENT AND PLAN: Chronic systolic heart failure secondary to nonischemic cardiomyopathy Roy Palmer appears euvolemic today. He remains active with minimal symptoms, consistent with NYHA class II. We again discussed the importance of optimization of evidence-based heart failure, particularly given continued severe LV dysfunction. Though he has been intolerant of even small medication changes in the past, we have agreed to  increase losartan to 25 mg daily. He would like to make this change when he returns from his vacation early next month. We will have him come in for a basic metabolic panel before making that change and again 2 weeks after escalating losartan. I will not change carvedilol at this time, as I suspect that excess beta blockade may have contributed to his prior symptoms. If he tolerates up titration of losartan, we may consider switching to Entresto at some point, as well as adding spironolactone.  Mitral regurgitation status post repair Roy Palmer appears euvolemic. We will continue with medical therapy, as outlined above.  Nonobstructive coronary artery disease No symptoms to suggest worsening coronary insufficiency. Given multiple medication intolerances in the past, I will defer adding a statin at this time. We will continue with indefinite low-dose aspirin.  Influenza immunization Seasonal flu vaccine administered at Roy Palmer' request today.  Follow-up: Return to clinic in 3 months.  [name], MD 02/19/2017 3:34 PM

## 2017-02-19 NOTE — Patient Instructions (Signed)
Medication Instructions:  Your physician recommends that you continue on your current medications as directed. Please refer to the Current Medication list given to you today.   Labwork: Your physician recommends that you return for lab work in: THE WEEK AFTER YOU RETURN FROM [place]. ([place]) - Please go to the [facility]. You will check in at the front desk to the right as you walk into the atrium. Valet Parking is offered if needed.   Testing/Procedures: NONE  Follow-Up: Your physician recommends that you schedule a follow-up appointment in: 3 MONTHS WITH DR END.   If you need a refill on your cardiac medications before your next appointment, please call your pharmacy.

## 2017-03-10 ENCOUNTER — Encounter: Payer: Self-pay | Admitting: Thoracic Surgery (Cardiothoracic Vascular Surgery)

## 2017-03-10 ENCOUNTER — Ambulatory Visit (INDEPENDENT_AMBULATORY_CARE_PROVIDER_SITE_OTHER): Payer: Medicare Other | Admitting: Thoracic Surgery (Cardiothoracic Vascular Surgery)

## 2017-03-10 VITALS — BP 132/75 | HR 78 | Ht 69.0 in | Wt 192.0 lb

## 2017-03-10 DIAGNOSIS — Z9889 Other specified postprocedural states: Secondary | ICD-10-CM | POA: Diagnosis not present

## 2017-03-10 NOTE — Progress Notes (Signed)
[address]       Roy Palmer [address]             [phone]     CARDIOTHORACIC SURGERY OFFICE NOTE  Referring Provider is End, Cristal Deer, MD PCP is Roy Cirri, NP   HPI:  Patient is an 83 year old male with history of mitral valve prolapse and chronic combined systolic and diastolic congestive heart failure who returns to the office today for routine follow-up approximately one year status post minimally invasive mitral valve repair on 03/19/2016. During his early postoperative recovery he suffered multiple recurrent episodes of sustained ventricular tachycardia requiring cardioversion. He ultimately underwent implantation of an ICD on 03/27/2016 by Dr. Ladona Palmer. He gradually recovered and was last seen here in our office on 06/10/2016 at which time he was doing well. Since then he has been followed carefully by Dr. Okey Palmer who he saw her recently on 02/19/2017. He completed the outpatient cardiac rehabilitation program and has clinically been doing well.  He states that he still gets short of breath and tired with moderate level activity but he claims that this only slightly limited his ability to do things and he is fine with ordinary light activities around the house. He has not had any resting shortness of breath, PND, orthopnea, or lower extremity edema. He has not had any palpitations and his defibrillator has not fired. He denies any history of chest pain or chest tightness. He has not had any other significant medical problems. He states that recently lay over the last week or 2 he developed some swelling in his right forearm. He has not had swelling in his left arm nor in either lower leg.   Current Outpatient Prescriptions  Medication Sig Dispense Refill  . aspirin EC 81 MG tablet Take 1 tablet (81 mg total) by mouth daily.    . carvedilol (COREG) 6.25 MG tablet Take 0.5 tablets (3.125 mg total) by mouth 2 (two) times daily. 180 tablet 3  . furosemide (LASIX)  40 MG tablet Take 1 tablet (40 mg total) by mouth daily. (Patient taking differently: Take 20 mg by mouth daily. ) 30 tablet 5  . losartan (COZAAR) 25 MG tablet Take 0.5 tablets (12.5 mg total) by mouth daily. 30 tablet 5   No current facility-administered medications for this visit.       Physical Exam:   BP 132/75   Pulse 78   Ht  (1.753 m)   Wt 192 lb (87.1 kg)   SpO2 92%   BMI 28.35 kg/m   General:  Well appearing  Chest:   Clear  CV:   Regular rate and rhythm without murmur  Incisions:  Completely healed  Abdomen:  Soft nontender  Extremities:  Warm and well-perfused, no lower extremity edema, moderate swelling along the lateral aspect of the right forearm  Diagnostic Tests:  Transthoracic Echocardiography  Patient:    Roy Palmer MR #:       [id number] Study Date: 09/03/2016 Gender:     M Age:        37 Height:     180.3 cm Weight:     88.5 kg BSA:        2.12 m^2 Pt. Status: Room:   Roy Palmer    Default, Provider [phone]  ORDERING     Roy Kendall, MD  REFERRING    Roy Kendall, MD  PERFORMING   Roy Palmer, Roy Palmer  SONOGRAPHER  Roy Palmer, RVT, RDCS, RDMS  cc:  -------------------------------------------------------------------  LV EF: 20% -   25%  ------------------------------------------------------------------- History:   PMH:  MV repair in 10/17.  Coronary artery disease. Congestive heart failure.  Mitral valve disease.  Primary pulmonary hypertension.  Risk factors:  Hypertension. Dyslipidemia.  ------------------------------------------------------------------- Study Conclusions  - Left ventricle: The cavity size was moderately dilated. Systolic   function was severely reduced. The estimated ejection fraction   was < 20%. Diffuse hypokinesis. Regional wall motion   abnormalities cannot be excluded. The study is not technically   sufficient to allow evaluation of LV diastolic function. - Mitral valve: Prior procedures  included surgical repair.   Transvalvular velocity was within the normal range. There was no   evidence for stenosis. There was moderate regurgitation. Peak   gradient (D): 5 mm Hg. Valve area by pressure half-time: 2.42   cm^2. - Left atrium: The atrium was moderately dilated. - Right ventricle: Pacer wire or catheter noted in right ventricle.   Systolic function was normal. - Pulmonary arteries: Systolic pressure was moderately elevated. PA   peak pressure: 54 mm Hg (S).  ------------------------------------------------------------------- Study data:  The previous study was not available, so comparison was made to the report of October 2017.  Study status:  Routine. Procedure:  Transthoracic echocardiography. Image quality was good.  Study completion:  There were no complications. Transthoracic echocardiography.  M-mode, complete 2D, spectral Doppler, and color Doppler.  Birthdate:  Patient birthdate: 1930-05-02.  Age:  Patient is 83 yr old.  Sex:  Gender: male. BMI: 27.2 kg/m^2.  Blood pressure:     130/80  Patient status: Outpatient.  Study date:  Study date: 09/03/2016. Study time: 12:56 PM.  -------------------------------------------------------------------  ------------------------------------------------------------------- Left ventricle:  The cavity size was moderately dilated. Systolic function was severely reduced. The estimated ejection fraction was < 20%. Diffuse hypokinesis. Regional wall motion abnormalities cannot be excluded. The study is not technically sufficient to allow evaluation of LV diastolic function.  ------------------------------------------------------------------- Aortic valve:   Trileaflet; normal thickness leaflets. Mobility was not restricted.  Doppler:  Transvalvular velocity was within the normal range. There was no stenosis. There was no regurgitation. VTI ratio of LVOT to aortic valve: 0.79. Valve area (VTI): 3 cm^2. Indexed valve area  (VTI): 1.41 cm^2/m^2. Mean velocity ratio of LVOT to aortic valve: 0.74. Valve area (Vmean): 2.81 cm^2. Indexed valve area (Vmean): 1.33 cm^2/m^2.    Mean gradient (S): 3 mm Hg.   ------------------------------------------------------------------- Aorta:  Aortic root: The aortic root was mildly dilated 3.4 cm  ------------------------------------------------------------------- Mitral valve:  Prior procedures included surgical repair. Mobility was not restricted.  Doppler:  Transvalvular velocity was within the normal range. There was no evidence for stenosis. There was moderate regurgitation.    Valve area by pressure half-time: 2.42 cm^2. Indexed valve area by pressure half-time: 1.14 cm^2/m^2. Valve area by continuity equation (using LVOT flow): 5.02 cm^2. Indexed valve area by continuity equation (using LVOT flow): 2.37 cm^2/m^2.    Mean gradient (D): 3 mm Hg. Peak gradient (D): 5 mm Hg.  ------------------------------------------------------------------- Left atrium:  The atrium was moderately dilated.  ------------------------------------------------------------------- Right ventricle:  The cavity size was normal. Wall thickness was normal. Pacer wire or catheter noted in right ventricle. Systolic function was normal.  ------------------------------------------------------------------- Pulmonic valve:    Doppler:  Transvalvular velocity was within the normal range. There was no evidence for stenosis.  ------------------------------------------------------------------- Tricuspid valve:   Structurally normal valve.    Doppler: Transvalvular velocity was within the normal range. There was mild regurgitation.  ------------------------------------------------------------------- Pulmonary artery:  The main pulmonary artery was normal-sized. Systolic pressure was moderately elevated.  ------------------------------------------------------------------- Right atrium:  The  atrium was normal in size.  ------------------------------------------------------------------- Pericardium:  There was no pericardial effusion.  ------------------------------------------------------------------- Systemic veins: Inferior vena cava: The vessel was normal in size.  ------------------------------------------------------------------- Measurements   Left ventricle                            Value          Reference  LV ID, ED, PLAX chordal           (H)     66    mm       43 - 52  LV ID, ES, PLAX chordal           (H)     60    mm       23 - 38  LV fx shortening, PLAX chordal    (L)     9     %        >=29  LV PW thickness, ED                       12    mm       ---------  IVS/LV PW ratio, ED                       1.08           <=1.3  Stroke volume, 2D                         173   ml       ---------  Stroke volume/bsa, 2D                     82    ml/m^2   ---------  LV ejection fraction, 1-p A4C             19    %        ---------  LV e&', lateral                            4.24  cm/s     ---------  LV E/e&', lateral                          26.42          ---------  LV e&', medial                             3.92  cm/s     ---------  LV E/e&', medial                           28.57          ---------  LV e&', average                            4.08  cm/s     ---------  LV E/e&', average                          27.45          ---------  Ventricular septum                        Value          Reference  IVS thickness, ED                         13    mm       ---------    LVOT                                      Value          Reference  LVOT ID, S                                22    mm       ---------  LVOT area                                 3.8   cm^2     ---------  LVOT ID                                   22    mm       ---------  LVOT mean velocity, S                     55.5  cm/s     ---------  LVOT VTI, S                               16    cm        ---------  Stroke volume (SV), LVOT DP               60.8  ml       ---------  Stroke index (SV/bsa), LVOT DP            28.7  ml/m^2   ---------    Aortic valve                              Value          Reference  Aortic valve mean velocity, S             75    cm/s     ---------  Aortic valve VTI, S                       20.3  cm       ---------  Aortic mean gradient, S                   3     mm Hg    ---------  VTI ratio, LVOT/AV                        0.79           ---------  Aortic valve area, VTI  3     cm^2     ---------  Aortic valve area/bsa, VTI                1.41  cm^2/m^2 ---------  Velocity ratio, mean, LVOT/AV             0.74           ---------  Aortic valve area, mean velocity          2.81  cm^2     ---------  Aortic valve area/bsa, mean               1.33  cm^2/m^2 ---------  velocity    Aorta                                     Value          Reference  Aortic root ID, ED                        33    mm       ---------  Ascending aorta ID, A-P, S                33    mm       ---------    Left atrium                               Value          Reference  LA ID, A-P, ES                            52    mm       ---------  LA ID/bsa, A-P                    (H)     2.45  cm/m^2   <=2.2  LA volume, S                              97    ml       ---------  LA volume/bsa, S                          45.8  ml/m^2   ---------  LA volume, ES, 1-p A4C                    85.2  ml       ---------  LA volume/bsa, ES, 1-p A4C                40.2  ml/m^2   ---------  LA volume, ES, 1-p A2C                    108   ml       ---------  LA volume/bsa, ES, 1-p A2C                51    ml/m^2   ---------    Mitral valve  Value          Reference  Mitral E-wave peak velocity               112   cm/s     ---------  Mitral A-wave peak velocity               93.6  cm/s     ---------  Mitral mean velocity, D                   80.5   cm/s     ---------  Mitral deceleration time          (H)     269   ms       150 - 230  Mitral pressure half-time                 79    ms       ---------  Mitral mean gradient, D                   3     mm Hg    ---------  Mitral peak gradient, D                   5     mm Hg    ---------  Mitral E/A ratio, peak                    1.2            ---------  Mitral valve area, PHT, DP                2.42  cm^2     ---------  Mitral valve area/bsa, PHT, DP            1.14  cm^2/m^2 ---------  Mitral valve area, LVOT                   5.02  cm^2     ---------  continuity  Mitral valve area/bsa, LVOT               2.37  cm^2/m^2 ---------  continuity  Mitral annulus VTI, D                     34.4  cm       ---------    Pulmonary arteries                        Value          Reference  PA pressure, S, DP                (H)     54    mm Hg    <=30    Tricuspid valve                           Value          Reference  Tricuspid regurg peak velocity            296   cm/s     ---------  Tricuspid peak RV-RA gradient             35    mm Hg    ---------    Right ventricle  Value          Reference  RV ID, minor axis, ED, A4C base           36    mm       ---------  TAPSE                                     17.3  mm       ---------  RV s&', lateral, S                         11.2  cm/s     ---------    Pulmonic valve                            Value          Reference  Pulmonic valve peak velocity, S           77.1  cm/s     ---------  Legend: (L)  and  (H)  mark values outside specified reference range.  ------------------------------------------------------------------- Prepared and Electronically Authenticated by  Dossie Arbour, MD, [facility] 2018-04-03T14:56:23       Transthoracic Echocardiography  Patient:    Roy Palmer, Roy Palmer MR #:       [id number] Study Date: 02/11/2017 Gender:     M Age:        87 Height:     180.3 cm Weight:     87.8 kg BSA:         2.11 m^2 Pt. Status: Room:   Roy Palmer    Roy Kendall, MD  ORDERING     Roy Kendall, MD  REFERRING    Roy Kendall, MD  PERFORMING   [place], [place]  SONOGRAPHER  Roy Palmer, RVT, RDCS, RDMS  cc:  ------------------------------------------------------------------- LV EF: 25% -   30%  ------------------------------------------------------------------- History:   PMH:   Coronary artery disease.  Congestive heart failure.  Mitral valve disease.  Primary pulmonary hypertension. Risk factors:  Hypertension. Dyslipidemia.  ------------------------------------------------------------------- Study Conclusions  - Procedure narrative: LIMITED ECHO to assess LVF. - Left ventricle: The cavity size was moderately dilated. There was   mild concentric hypertrophy. Systolic function was severely   reduced. The estimated ejection fraction was in the range of 25%   to 30%. Probable akinesis of the inferolateral and inferior   myocardium. Features are consistent with a pseudonormal left   ventricular filling pattern, with concomitant abnormal relaxation   and increased filling pressure (grade 2 diastolic dysfunction). - Mitral valve: Prior procedures included surgical repair. - Left atrium: The atrium was mildly dilated.  ------------------------------------------------------------------- Study data:  The previous study was not available, so comparison was made to the report of 09/03/2016.  Study status:  Routine. Procedure:  LIMITED ECHO to assess LVF. Transthoracic echocardiography. Image quality was good.  Study completion:  There were no complications.          Transthoracic echocardiography. M-mode, complete 2D, spectral Doppler, and color Doppler. Birthdate:  Patient birthdate: Oct 18, 1929.  Age:  Patient is 83 yr old.  Sex:  Gender: male.    BMI: 27 kg/m^2.  Blood pressure: 122/60  Patient status:  Outpatient.  Study date:  Study date: 02/11/2017. Study time: 10:25  AM.  -------------------------------------------------------------------  ------------------------------------------------------------------- Left ventricle:  The cavity size was moderately dilated. There was mild concentric hypertrophy.  Systolic function was severely reduced. The estimated ejection fraction was in the range of 25% to 30%.  Regional wall motion abnormalities:   Probable akinesis of the inferolateral and inferior myocardium. Features are consistent with a pseudonormal left ventricular filling pattern, with concomitant abnormal relaxation and increased filling pressure (grade 2 diastolic dysfunction).  ------------------------------------------------------------------- Aortic valve:   Trileaflet; normal thickness, mildly calcified leaflets. Mobility was not restricted.  ------------------------------------------------------------------- Aorta:  Aortic root: The aortic root was normal in size.  ------------------------------------------------------------------- Mitral valve:  Prior procedures included surgical repair. Mobility was not restricted.  Doppler:     Valve area by pressure half-time: 2.29 cm^2. Indexed valve area by pressure half-time: 1.09 cm^2/m^2.    Peak gradient (D): 6 mm Hg.  ------------------------------------------------------------------- Left atrium:  The atrium was mildly dilated.  ------------------------------------------------------------------- Right ventricle:  The cavity size was normal. Wall thickness was normal. Systolic function was normal.  ------------------------------------------------------------------- Pulmonic valve:    Doppler:  Transvalvular velocity was within the normal range.  ------------------------------------------------------------------- Tricuspid valve:   Structurally normal valve.  ------------------------------------------------------------------- Pulmonary artery:   The main pulmonary artery was  normal-sized.  ------------------------------------------------------------------- Right atrium:  Not visualized.  ------------------------------------------------------------------- Pericardium:  There was no pericardial effusion.  ------------------------------------------------------------------- Measurements   Left ventricle                            Value          Reference  LV ID, ED, PLAX chordal         (H)       62    mm       43 - 52  LV ID, ES, PLAX chordal         (H)       51    mm       23 - 38  LV fx shortening, PLAX chordal  (L)       18    %        >=29  LV PW thickness, ED                       14    mm       ---------  IVS/LV PW ratio, ED                       1              <=1.3  LV ejection fraction, 1-p A4C             30    %        ---------  LV end-diastolic volume, 2-p              187   ml       ---------  LV end-systolic volume, 2-p               135   ml       ---------  LV ejection fraction, 2-p                 28    %        ---------  Stroke volume, 2-p                        52    ml       ---------  LV end-diastolic volume/bsa,  89    ml/m^2   ---------  2-p  LV end-systolic volume/bsa, 2-p           64    ml/m^2   ---------  Stroke volume/bsa, 2-p                    24.6  ml/m^2   ---------  LV e&', lateral                            6.15  cm/s     ---------  LV E/e&', lateral                          20.65          ---------  LV e&', medial                             3.15  cm/s     ---------  LV E/e&', medial                           40.32          ---------  LV e&', average                            4.65  cm/s     ---------  LV E/e&', average                          27.31          ---------    Ventricular septum                        Value          Reference  IVS thickness, ED                         14    mm       ---------    Mitral valve                              Value          Reference  Mitral E-wave peak  velocity               127   cm/s     ---------  Mitral A-wave peak velocity               117   cm/s     ---------  Mitral deceleration time        (H)       327   ms       150 - 230  Mitral pressure half-time                 96    ms       ---------  Mitral peak gradient, D                   6     mm Hg    ---------  Mitral E/A ratio, peak  1.1            ---------  Mitral valve area, PHT, DP                2.29  cm^2     ---------  Mitral valve area/bsa, PHT, DP            1.09  cm^2/m^2 ---------    Right ventricle                           Value          Reference  TAPSE                                     18.9  mm       ---------  RV s&', lateral, S                         12.4  cm/s     ---------  Legend: (L)  and  (H)  mark values outside specified reference range.  ------------------------------------------------------------------- Prepared and Electronically Authenticated by  Lorine Bears, MD 2018-09-12T12:35:34   Impression:  Patient appears clinically stable approximately 1 year status post minimally invasive mitral valve repair. He describes stable symptoms of exertional shortness of breath and fatigue consistent with chronic combined systolic and diastolic congestive heart failure, [place] Heart Association functional class II. He has some unusual swelling in his right forearm that he states developed just recently over the last week or so. I'm not sure what to make of this. His lungs are clear and he has no lower extremity edema. Recent follow-up limited echocardiogram suggests some improvement in left ventricular systolic function, but overall systolic function remains moderate to severely reduced.  Plan:  We have not recommended any changes to the patient's current medications. I suggested that the patient keep his right arm propped up on pillows when he sleeps at night and to discuss further with Dr. Okey Palmer at his next office visit if the swelling  does not resolve. Upper extremity duplex scan could be considered.  The patient has been reminded regarding the importance of dental hygiene and the lifelong need for antibiotic prophylaxis for all dental cleanings and other related invasive procedures.  In the future he will call and return to see [place] only should specific problems or questions arise.  I spent in excess of 15 minutes during the conduct of this office consultation and >50% of this time involved direct face-to-face encounter with the patient for counseling and/or coordination of their care.   Salvatore Decent. Cornelius Moras, MD 03/10/2017 10:51 AM

## 2017-03-10 NOTE — Patient Instructions (Signed)

## 2017-03-24 ENCOUNTER — Ambulatory Visit (INDEPENDENT_AMBULATORY_CARE_PROVIDER_SITE_OTHER): Payer: Medicare Other | Admitting: *Deleted

## 2017-03-24 DIAGNOSIS — I5022 Chronic systolic (congestive) heart failure: Secondary | ICD-10-CM

## 2017-03-24 DIAGNOSIS — I428 Other cardiomyopathies: Secondary | ICD-10-CM

## 2017-03-24 NOTE — Progress Notes (Signed)
Remote ICD transmission.   

## 2017-03-28 ENCOUNTER — Ambulatory Visit (INDEPENDENT_AMBULATORY_CARE_PROVIDER_SITE_OTHER): Payer: Medicare Other | Admitting: Unknown Physician Specialty

## 2017-03-28 ENCOUNTER — Encounter: Payer: Self-pay | Admitting: Cardiology

## 2017-03-28 ENCOUNTER — Encounter: Payer: Self-pay | Admitting: Unknown Physician Specialty

## 2017-03-28 VITALS — BP 122/69 | HR 80 | Temp 97.6°F | Wt 199.0 lb

## 2017-03-28 DIAGNOSIS — M7021 Olecranon bursitis, right elbow: Secondary | ICD-10-CM | POA: Diagnosis not present

## 2017-03-28 MED ORDER — TRIAMCINOLONE ACETONIDE 40 MG/ML IJ SUSP
40.0000 mg | Freq: Once | INTRAMUSCULAR | Status: AC
  Administered 2017-03-28: 40 mg via INTRAMUSCULAR

## 2017-03-28 NOTE — Progress Notes (Signed)
   BP 122/69   Pulse 80   Temp 97.6 F (36.4 C)   Wt 199 lb (90.3 kg)   SpO2 97%   BMI 29.39 kg/m    Subjective:    Patient ID: Roy Palmer, male    DOB: [birth year], 83 y.o.   MRN: [id number]  HPI: Roy Palmer is a 83 y.o. male  Chief Complaint  Patient presents with  . Joint Swelling    pt states that his right elbow has been swollen for about 2 weeks   Bursitis Pt is here with right elbow bursal swelling.  States it has been there for 2 weeks. No pain.  Does not remember an injury.    Relevant past medical, surgical, family and social history reviewed and updated as indicated. Interim medical history since our last visit reviewed. Allergies and medications reviewed and updated.  Review of Systems  Per HPI unless specifically indicated above     Objective:    BP 122/69   Pulse 80   Temp 97.6 F (36.4 C)   Wt 199 lb (90.3 kg)   SpO2 97%   BMI 29.39 kg/m   Wt Readings from Last 3 Encounters:  03/28/17 199 lb (90.3 kg)  03/10/17 192 lb (87.1 kg)  02/19/17 196 lb 8 oz (89.1 kg)    Physical Exam  Constitutional: He is oriented to person, place, and time. He appears well-developed and well-nourished. No distress.  HENT:  Head: Normocephalic and atraumatic.  Eyes: Conjunctivae and lids are normal. Right eye exhibits no discharge. Left eye exhibits no discharge. No scleral icterus.  Cardiovascular: Normal rate.   Pulmonary/Chest: Effort normal.  Abdominal: Normal appearance. There is no splenomegaly or hepatomegaly.  Musculoskeletal:       Left elbow: He exhibits swelling.  Swelling in bursae.  After sterile prep. Using 18 gague needle, able to draw 28 cc's of straw colored fluid off of elbow.  Injected 1 cc of Kenalog.  Placed compression bandage over elbow  Neurological: He is alert and oriented to person, place, and time.  Skin: Skin is intact. No rash noted. No pallor.  Psychiatric: He has a normal mood and affect. His behavior is normal. Judgment and  thought content normal.     Assessment & Plan:   Problem List Items Addressed This Visit    None    Visit Diagnoses    Olecranon bursitis of right elbow    -  Primary   discussed with pt common recurrence.  Tolerated procedure well       Follow up plan: Return for needs PE.

## 2017-03-28 NOTE — Addendum Note (Signed)
Addended by: Pablo Ledger on: 03/28/2017 04:48 PM   Modules accepted: Orders

## 2017-04-01 ENCOUNTER — Telehealth: Payer: Self-pay

## 2017-04-01 LAB — CUP PACEART REMOTE DEVICE CHECK
Battery Remaining Longevity: 125 mo
Battery Voltage: 3.02 V
Brady Statistic AP VP Percent: 2.87 %
Brady Statistic AP VS Percent: 3.04 %
Brady Statistic AS VP Percent: 19.73 %
Brady Statistic AS VS Percent: 74.37 %
Brady Statistic RA Percent Paced: 5.78 %
Brady Statistic RV Percent Paced: 21.9 %
Date Time Interrogation Session: 20181022052404
HighPow Impedance: 66 Ohm
Implantable Lead Implant Date: 20171025
Implantable Lead Implant Date: 20171025
Implantable Lead Location: 753859
Implantable Lead Location: 753860
Implantable Lead Model: 5076
Implantable Pulse Generator Implant Date: 20171025
Lead Channel Impedance Value: 399 Ohm
Lead Channel Impedance Value: 456 Ohm
Lead Channel Impedance Value: 456 Ohm
Lead Channel Pacing Threshold Amplitude: 0.75 V
Lead Channel Pacing Threshold Amplitude: 0.75 V
Lead Channel Pacing Threshold Pulse Width: 0.4 ms
Lead Channel Pacing Threshold Pulse Width: 0.4 ms
Lead Channel Sensing Intrinsic Amplitude: 2.5 mV
Lead Channel Sensing Intrinsic Amplitude: 2.5 mV
Lead Channel Sensing Intrinsic Amplitude: 30 mV
Lead Channel Sensing Intrinsic Amplitude: 30 mV
Lead Channel Setting Pacing Amplitude: 2 V
Lead Channel Setting Pacing Amplitude: 2.5 V
Lead Channel Setting Pacing Pulse Width: 0.4 ms
Lead Channel Setting Sensing Sensitivity: 0.3 mV

## 2017-04-01 NOTE — Telephone Encounter (Signed)
Spoke with pt regarding ATP episodes from 10/19 and 10/15~ pt did not recall any symptoms from these dates, pt aware of driving restrictions x 6 months. Pt aware of upcoming apt with GT on 04/16/17 at 9:45am.

## 2017-04-02 LAB — BODY FLUID CULTURE

## 2017-04-04 ENCOUNTER — Other Ambulatory Visit
Admission: RE | Admit: 2017-04-04 | Discharge: 2017-04-04 | Disposition: A | Discharge status: Home / Self Care | Payer: Medicare Other | Source: Ambulatory Visit | Attending: Internal Medicine | Admitting: Internal Medicine

## 2017-04-04 DIAGNOSIS — I5022 Chronic systolic (congestive) heart failure: Secondary | ICD-10-CM | POA: Diagnosis present

## 2017-04-04 DIAGNOSIS — Z79899 Other long term (current) drug therapy: Secondary | ICD-10-CM

## 2017-04-04 DIAGNOSIS — I38 Endocarditis, valve unspecified: Secondary | ICD-10-CM | POA: Diagnosis present

## 2017-04-04 LAB — BASIC METABOLIC PANEL
Anion gap: 6 (ref 5–15)
BUN: 42 mg/dL — ABNORMAL HIGH (ref 6–20)
CO2: 23 mmol/L (ref 22–32)
Calcium: 8.7 mg/dL — ABNORMAL LOW (ref 8.9–10.3)
Chloride: 106 mmol/L (ref 101–111)
Creatinine, Ser: 2.16 mg/dL — ABNORMAL HIGH (ref 0.61–1.24)
GFR calc Af Amer: 30 mL/min — ABNORMAL LOW (ref >60)
GFR calc non Af Amer: 26 mL/min — ABNORMAL LOW (ref >60)
Glucose, Bld: 137 mg/dL — ABNORMAL HIGH (ref 65–99)
Potassium: 4.2 mmol/L (ref 3.5–5.1)
Sodium: 135 mmol/L (ref 135–145)

## 2017-04-07 ENCOUNTER — Telehealth: Payer: Self-pay | Admitting: *Deleted

## 2017-04-07 DIAGNOSIS — Z79899 Other long term (current) drug therapy: Secondary | ICD-10-CM

## 2017-04-07 DIAGNOSIS — I10 Essential (primary) hypertension: Secondary | ICD-10-CM

## 2017-04-07 NOTE — Telephone Encounter (Signed)
-----   Message from [name], MD sent at 04/06/2017  4:13 PM EST ----- Please let Roy Palmer know that his creatinine has increased since losartan was increased. I recommend that he cut losartan back to 12.5 mg daily and hold his  Furosemide. We should repeat a BMP in ~1 week.

## 2017-04-07 NOTE — Telephone Encounter (Signed)
Patient verbalized understanding of results and medication changes and plan to go [facility] 1 week for repeat BMP.

## 2017-04-14 ENCOUNTER — Other Ambulatory Visit
Admission: RE | Admit: 2017-04-14 | Discharge: 2017-04-14 | Disposition: A | Discharge status: Home / Self Care | Payer: Medicare Other | Source: Ambulatory Visit | Attending: Internal Medicine | Admitting: Internal Medicine

## 2017-04-14 DIAGNOSIS — Z79899 Other long term (current) drug therapy: Secondary | ICD-10-CM | POA: Diagnosis present

## 2017-04-14 DIAGNOSIS — I10 Essential (primary) hypertension: Secondary | ICD-10-CM | POA: Diagnosis present

## 2017-04-14 LAB — BASIC METABOLIC PANEL
Anion gap: 9 (ref 5–15)
BUN: 28 mg/dL — ABNORMAL HIGH (ref 6–20)
CO2: 24 mmol/L (ref 22–32)
Calcium: 8.6 mg/dL — ABNORMAL LOW (ref 8.9–10.3)
Chloride: 105 mmol/L (ref 101–111)
Creatinine, Ser: 1.51 mg/dL — ABNORMAL HIGH (ref 0.61–1.24)
GFR calc Af Amer: 47 mL/min — ABNORMAL LOW (ref >60)
GFR calc non Af Amer: 40 mL/min — ABNORMAL LOW (ref >60)
Glucose, Bld: 107 mg/dL — ABNORMAL HIGH (ref 65–99)
Potassium: 4.5 mmol/L (ref 3.5–5.1)
Sodium: 138 mmol/L (ref 135–145)

## 2017-04-16 ENCOUNTER — Encounter: Payer: Self-pay | Admitting: Internal Medicine

## 2017-04-16 ENCOUNTER — Ambulatory Visit (INDEPENDENT_AMBULATORY_CARE_PROVIDER_SITE_OTHER): Payer: Medicare Other | Admitting: Internal Medicine

## 2017-04-16 VITALS — BP 126/82 | HR 51 | Ht 69.0 in | Wt 195.0 lb

## 2017-04-16 DIAGNOSIS — Z9889 Other specified postprocedural states: Secondary | ICD-10-CM | POA: Diagnosis not present

## 2017-04-16 DIAGNOSIS — I472 Ventricular tachycardia, unspecified: Secondary | ICD-10-CM

## 2017-04-16 DIAGNOSIS — I5022 Chronic systolic (congestive) heart failure: Secondary | ICD-10-CM

## 2017-04-16 DIAGNOSIS — Z95810 Presence of automatic (implantable) cardiac defibrillator: Secondary | ICD-10-CM

## 2017-04-16 NOTE — Patient Instructions (Addendum)
Medication Instructions:  Your physician recommends that you continue on your current medications as directed. Please refer to the Current Medication list given to you today.  Labwork: None ordered.  Testing/Procedures: None ordered.  Follow-Up: Your physician wants you to follow-up in: 6 months with Dr. Ladona Ridgel.   You will receive a reminder letter in the mail two months in advance. If you don't receive a letter, please call our office to schedule the follow-up appointment.  Remote monitoring is used to monitor your ICD from home. This monitoring reduces the number of office visits required to check your device to one time per year. It allows [place] to keep an eye on the functioning of your device to ensure it is working properly. You are scheduled for a device check from home on 06/23/2017. You may send your transmission at any time that day. If you have a wireless device, the transmission will be sent automatically. After your physician reviews your transmission, you will receive a postcard with your next transmission date.    Any Other Special Instructions Will Be Listed Below (If Applicable).     If you need a refill on your cardiac medications before your next appointment, please call your pharmacy.

## 2017-04-16 NOTE — Progress Notes (Signed)
HPI Mr. Pande returns today for ongoing evaluation and management of his ICD in the setting of VT and sinus node dysfunction. In the interim, he has done well. He went fishing last month and after he had some VT with successful ATP. He was asymptomatic. Allergies  Allergen Reactions  . Amiodarone Other (See Comments)    Terrible dreams/nightmares  . Lyrica [Pregabalin] Nausea And Vomiting     Current Outpatient Medications  Medication Sig Dispense Refill  . aspirin EC 81 MG tablet Take 1 tablet (81 mg total) by mouth daily.    . carvedilol (COREG) 6.25 MG tablet Take 0.5 tablets (3.125 mg total) by mouth 2 (two) times daily. 180 tablet 3  . losartan (COZAAR) 25 MG tablet Take 0.5 tablets (12.5 mg total) by mouth daily. 30 tablet 5  . omeprazole (PRILOSEC) 20 MG capsule Take 20 mg daily by mouth.     No current facility-administered medications for this visit.      Past Medical History:  Diagnosis Date  . AAA (abdominal aortic aneurysm) without rupture (HCC) 03/12/2016   Small - measured 3.3 x 3.5 cm by CTA  . CKD (chronic kidney disease) stage 3, GFR 30-59 ml/min (HCC)   . Essential hypertension   . Hyperlipidemia   . Hypertensive kidney disease   . Incidental pulmonary nodule 03/12/2016   Vague opacity right lung base requires radiographic follow up - index of suspicion is LOW  . Near syncope    Cardiogenic, related to an SVT and severe MR  . Non-sustained ventricular tachycardia (HCC)   . S/P minimally invasive mitral valve repair 03/19/2016   Complex valvuloplasty including triangular resection of flail segment of posterior leaflet, chordal transposition x1, artificial Gore-tex neochord placement x4 and 34 mm Sorin Memo 3D ring annuloplasty via right mini thoracotomy approach with clipping of LA appendage  . Severe mitral regurgitation by prior echocardiogram 02/23/2016   Confirmed by TEE  . Syncope 03/15/2016  . Thoracic aortic aneurysm (HCC) 03/12/2016   Very  very mild fusiform enlargement of distal transverse and proximal descending thoracic aorta noted on CTA  . Trigeminal neuralgia     ROS:   All systems reviewed and negative except as noted in the HPI.   Past Surgical History:  Procedure Laterality Date  . CATARACT EXTRACTION, BILATERAL    . COLONOSCOPY  08/2005  . CYST EXCISION  10/2013   from neck  . ESOPHAGOGASTRODUODENOSCOPY  02/2010  . TRANSTHORACIC ECHOCARDIOGRAM  02/14/2016   EF 50-55% with moderate LVH. Possible inferolateral hypokinesis. Moderate-severe MR with posterior leaflet prolapse, cannot rule out flail. Severe LA dilation. Moderate TR.     Family History  Problem Relation Age of Onset  . Hypertension Mother   . Stroke Mother   . Heart disease Father   . Heart attack Father   . Dementia Sister   . Retinoblastoma Son   . COPD Neg Hx   . Cancer Neg Hx   . Diabetes Neg Hx      Social History   Socioeconomic History  . Marital status: Married    Spouse name: Not on file  . Number of children: Not on file  . Years of education: Not on file  . Highest education level: Not on file  Social Needs  . Financial resource strain: Not on file  . Food insecurity - worry: Not on file  . Food insecurity - inability: Not on file  . Transportation needs - medical: Not on file  .  Transportation needs - non-medical: Not on file  Occupational History  . Not on file  Tobacco Use  . Smoking status: Never Smoker  . Smokeless tobacco: Never Used  Substance and Sexual Activity  . Alcohol use: No  . Drug use: No  . Sexual activity: Not on file  Other Topics Concern  . Not on file  Social History Narrative  . Not on file     BP 126/82   Pulse (!) 51   Ht 5\' 9"  (1.753 m)   Wt 195 lb (88.5 kg)   SpO2 98%   BMI 28.80 kg/m   Physical Exam:  Well appearing NAD HEENT: Unremarkable Neck:  No JVD, no thyromegally Lymphatics:  No adenopathy Back:  No CVA tenderness Lungs:  Clear with no wheezes HEART:   Regular rate rhythm, no murmurs, no rubs, no clicks Abd:  soft, positive bowel sounds, no organomegally, no rebound, no guarding Ext:  2 plus pulses, no edema, no cyanosis, no clubbing Skin:  No rashes no nodules Neuro:  CN II through XII intact, motor grossly intact   DEVICE  Normal device function.  See PaceArt for details.   Assess/Plan: 1. VT - after almost a year, he has begun to have more VT. I have cautioned him that he is not supposed to drive. I will hold on AA drug therapy for now but if he continues to have VT, will plan to restart either mexitil or sotalol. He cannot tolerate amio. 2. Chronic systolic heart failure - his symptoms are class 1. He will continue his current meds. 3. ICD - his medtronic device is working normally. Will recheck in several months.  Leonia ReevesGregg Taylor,M.D.

## 2017-04-22 ENCOUNTER — Encounter: Payer: Self-pay | Admitting: Unknown Physician Specialty

## 2017-04-22 ENCOUNTER — Ambulatory Visit: Payer: Medicare Other | Admitting: Unknown Physician Specialty

## 2017-04-22 VITALS — BP 125/78 | HR 72 | Temp 97.5°F | Wt 198.6 lb

## 2017-04-22 DIAGNOSIS — M7021 Olecranon bursitis, right elbow: Secondary | ICD-10-CM

## 2017-04-22 NOTE — Progress Notes (Signed)
BP 125/78   Pulse 72   Temp (!) 97.5 F (36.4 C) (Oral)   Wt 198 lb 9.6 oz (90.1 kg)   SpO2 96%   BMI 29.33 kg/m    Subjective:    Patient ID: Roy Palmer, male    DOB: May 23, 1930, 83 y.o.   MRN: [id number]  HPI: Roy Palmer is a 83 y.o. male  Chief Complaint  Patient presents with  . Edema    right elbow    Olecranon bursitis Pt with right olecranon swelling.  Last visit on 10/26 drained 28 cc's and injected 1 cc of Kenalog.  Elbow has continued swelling.  Denies pain or injury   Relevant past medical, surgical, family and social history reviewed and updated as indicated. Interim medical history since our last visit reviewed. Allergies and medications reviewed and updated.  Review of Systems  Per HPI unless specifically indicated above     Objective:    BP 125/78   Pulse 72   Temp (!) 97.5 F (36.4 C) (Oral)   Wt 198 lb 9.6 oz (90.1 kg)   SpO2 96%   BMI 29.33 kg/m   Wt Readings from Last 3 Encounters:  04/22/17 198 lb 9.6 oz (90.1 kg)  04/16/17 195 lb (88.5 kg)  03/28/17 199 lb (90.3 kg)    Physical Exam  Constitutional: He is oriented to person, place, and time. He appears well-developed and well-nourished. No distress.  HENT:  Head: Normocephalic and atraumatic.  Eyes: Conjunctivae and lids are normal. Right eye exhibits no discharge. Left eye exhibits no discharge. No scleral icterus.  Cardiovascular: Normal rate.  Pulmonary/Chest: Effort normal.  Abdominal: Normal appearance. There is no splenomegaly or hepatomegaly.  Musculoskeletal: Normal range of motion.  Swelling of right olecranon.  No redness  Neurological: He is alert and oriented to person, place, and time.  Skin: Skin is intact. No rash noted. No pallor.  Psychiatric: He has a normal mood and affect. His behavior is normal. Judgment and thought content normal.   Swelling in bursae.  After sterile prep. Using 18 gague needle, able to draw 65 cc's of straw colored fluid off of elbow.   Injected 1 cc of Kenalog.  Placed compression bandage over elbow     Results for orders placed or performed during the hospital encounter of 04/14/17  Basic metabolic panel  Result Value Ref Range   Sodium 138 135 - 145 mmol/L   Potassium 4.5 3.5 - 5.1 mmol/L   Chloride 105 101 - 111 mmol/L   CO2 24 22 - 32 mmol/L   Glucose, Bld 107 (H) 65 - 99 mg/dL   BUN 28 (H) 6 - 20 mg/dL   Creatinine, Ser 0.03 (H) 0.61 - 1.24 mg/dL   Calcium 8.6 (L) 8.9 - 10.3 mg/dL   GFR calc non Af Amer 40 (L) >60 mL/min   GFR calc Af Amer 47 (L) >60 mL/min   Anion gap 9 5 - 15      Assessment & Plan:   Problem List Items Addressed This Visit    None    Visit Diagnoses    Olecranon bursitis, right elbow    -  Primary   Today drained 65 cc's of clear serous fluid.  Tolerated procedure well.  With Kenalog of no effect, will refer to orthopedic surgery   Relevant Orders   Body Fluid Culture       Follow up plan: Return if symptoms worsen or fail to improve.

## 2017-04-24 ENCOUNTER — Other Ambulatory Visit: Payer: Self-pay

## 2017-04-24 ENCOUNTER — Encounter: Payer: Self-pay | Admitting: *Deleted

## 2017-04-24 ENCOUNTER — Emergency Department: Payer: Medicare Other

## 2017-04-24 ENCOUNTER — Inpatient Hospital Stay
Admission: EM | Admit: 2017-04-24 | Discharge: 2017-04-27 | DRG: 309 | Disposition: A | Discharge status: Home / Self Care | Payer: Medicare Other | Attending: Internal Medicine | Admitting: Internal Medicine

## 2017-04-24 DIAGNOSIS — I2510 Atherosclerotic heart disease of native coronary artery without angina pectoris: Secondary | ICD-10-CM | POA: Diagnosis present

## 2017-04-24 DIAGNOSIS — Z888 Allergy status to other drugs, medicaments and biological substances status: Secondary | ICD-10-CM

## 2017-04-24 DIAGNOSIS — I248 Other forms of acute ischemic heart disease: Secondary | ICD-10-CM | POA: Diagnosis present

## 2017-04-24 DIAGNOSIS — I472 Ventricular tachycardia, unspecified: Secondary | ICD-10-CM

## 2017-04-24 DIAGNOSIS — Z4502 Encounter for adjustment and management of automatic implantable cardiac defibrillator: Secondary | ICD-10-CM

## 2017-04-24 DIAGNOSIS — E785 Hyperlipidemia, unspecified: Secondary | ICD-10-CM | POA: Diagnosis present

## 2017-04-24 DIAGNOSIS — I130 Hypertensive heart and chronic kidney disease with heart failure and stage 1 through stage 4 chronic kidney disease, or unspecified chronic kidney disease: Secondary | ICD-10-CM | POA: Diagnosis present

## 2017-04-24 DIAGNOSIS — R42 Dizziness and giddiness: Secondary | ICD-10-CM | POA: Diagnosis not present

## 2017-04-24 DIAGNOSIS — Z7982 Long term (current) use of aspirin: Secondary | ICD-10-CM

## 2017-04-24 DIAGNOSIS — I499 Cardiac arrhythmia, unspecified: Secondary | ICD-10-CM | POA: Diagnosis present

## 2017-04-24 DIAGNOSIS — Z79899 Other long term (current) drug therapy: Secondary | ICD-10-CM

## 2017-04-24 DIAGNOSIS — Z8249 Family history of ischemic heart disease and other diseases of the circulatory system: Secondary | ICD-10-CM

## 2017-04-24 DIAGNOSIS — I428 Other cardiomyopathies: Secondary | ICD-10-CM | POA: Diagnosis present

## 2017-04-24 DIAGNOSIS — N183 Chronic kidney disease, stage 3 (moderate): Secondary | ICD-10-CM | POA: Diagnosis present

## 2017-04-24 DIAGNOSIS — I5022 Chronic systolic (congestive) heart failure: Secondary | ICD-10-CM | POA: Diagnosis present

## 2017-04-24 LAB — BASIC METABOLIC PANEL
Anion gap: 9 (ref 5–15)
BUN: 24 mg/dL — ABNORMAL HIGH (ref 6–20)
CO2: 22 mmol/L (ref 22–32)
Calcium: 8.1 mg/dL — ABNORMAL LOW (ref 8.9–10.3)
Chloride: 105 mmol/L (ref 101–111)
Creatinine, Ser: 1.56 mg/dL — ABNORMAL HIGH (ref 0.61–1.24)
GFR calc Af Amer: 45 mL/min — ABNORMAL LOW (ref >60)
GFR calc non Af Amer: 39 mL/min — ABNORMAL LOW (ref >60)
Glucose, Bld: 150 mg/dL — ABNORMAL HIGH (ref 65–99)
Potassium: 4 mmol/L (ref 3.5–5.1)
Sodium: 136 mmol/L (ref 135–145)

## 2017-04-24 LAB — TROPONIN I: Troponin I: <0.03 ng/mL (ref <0.03)

## 2017-04-24 LAB — MAGNESIUM: Magnesium: 2.2 mg/dL (ref 1.7–2.4)

## 2017-04-24 NOTE — ED Triage Notes (Signed)
Pt was shocked by his defibrillator/pacer tonight. Pt was laying on his sofa. EMS reports highly variable pulse between [address]. Pt had pacer since 03/2016. Pt Pt Z & O x 4.

## 2017-04-25 ENCOUNTER — Observation Stay (HOSPITAL_COMMUNITY)
Admit: 2017-04-25 | Discharge: 2017-04-25 | Disposition: A | Discharge status: Home / Self Care | Payer: Medicare Other | Attending: Internal Medicine | Admitting: Internal Medicine

## 2017-04-25 ENCOUNTER — Other Ambulatory Visit: Payer: Self-pay

## 2017-04-25 ENCOUNTER — Encounter: Payer: Self-pay | Admitting: Internal Medicine

## 2017-04-25 DIAGNOSIS — R748 Abnormal levels of other serum enzymes: Secondary | ICD-10-CM

## 2017-04-25 DIAGNOSIS — I472 Ventricular tachycardia: Secondary | ICD-10-CM | POA: Diagnosis not present

## 2017-04-25 DIAGNOSIS — Z4502 Encounter for adjustment and management of automatic implantable cardiac defibrillator: Secondary | ICD-10-CM | POA: Diagnosis not present

## 2017-04-25 DIAGNOSIS — R55 Syncope and collapse: Secondary | ICD-10-CM

## 2017-04-25 DIAGNOSIS — I428 Other cardiomyopathies: Secondary | ICD-10-CM | POA: Diagnosis not present

## 2017-04-25 DIAGNOSIS — I340 Nonrheumatic mitral (valve) insufficiency: Secondary | ICD-10-CM | POA: Diagnosis not present

## 2017-04-25 DIAGNOSIS — I499 Cardiac arrhythmia, unspecified: Secondary | ICD-10-CM | POA: Diagnosis present

## 2017-04-25 LAB — TSH: TSH: 0.718 u[IU]/mL (ref 0.350–4.500)

## 2017-04-25 LAB — CBC
HCT: 38.5 % — ABNORMAL LOW (ref 40.0–52.0)
HCT: 37.9 % — ABNORMAL LOW (ref 40.0–52.0)
Hemoglobin: 13 g/dL (ref 13.0–18.0)
Hemoglobin: 12.9 g/dL — ABNORMAL LOW (ref 13.0–18.0)
MCH: 32.1 pg (ref 26.0–34.0)
MCH: 32.3 pg (ref 26.0–34.0)
MCHC: 33.7 g/dL (ref 32.0–36.0)
MCHC: 34.2 g/dL (ref 32.0–36.0)
MCV: 95.4 fL (ref 80.0–100.0)
MCV: 94.5 fL (ref 80.0–100.0)
Platelets: 176 10*3/uL (ref 150–440)
Platelets: 176 10*3/uL (ref 150–440)
RBC: 4.04 MIL/uL — ABNORMAL LOW (ref 4.40–5.90)
RBC: 4.01 MIL/uL — ABNORMAL LOW (ref 4.40–5.90)
RDW: 14 % (ref 11.5–14.5)
RDW: 13.9 % (ref 11.5–14.5)
WBC: 9.2 10*3/uL (ref 3.8–10.6)
WBC: 7.6 10*3/uL (ref 3.8–10.6)

## 2017-04-25 LAB — BASIC METABOLIC PANEL
Anion gap: 8 (ref 5–15)
BUN: 20 mg/dL (ref 6–20)
CO2: 23 mmol/L (ref 22–32)
Calcium: 8.5 mg/dL — ABNORMAL LOW (ref 8.9–10.3)
Chloride: 107 mmol/L (ref 101–111)
Creatinine, Ser: 1.29 mg/dL — ABNORMAL HIGH (ref 0.61–1.24)
GFR calc Af Amer: 57 mL/min — ABNORMAL LOW (ref >60)
GFR calc non Af Amer: 49 mL/min — ABNORMAL LOW (ref >60)
Glucose, Bld: 101 mg/dL — ABNORMAL HIGH (ref 65–99)
Potassium: 4.3 mmol/L (ref 3.5–5.1)
Sodium: 138 mmol/L (ref 135–145)

## 2017-04-25 LAB — CREATININE, SERUM
Creatinine, Ser: 1.36 mg/dL — ABNORMAL HIGH (ref 0.61–1.24)
GFR calc Af Amer: 53 mL/min — ABNORMAL LOW (ref >60)
GFR calc non Af Amer: 46 mL/min — ABNORMAL LOW (ref >60)

## 2017-04-25 LAB — PROTIME-INR
INR: 1.11
Prothrombin Time: 14.2 seconds (ref 11.4–15.2)

## 2017-04-25 LAB — HEPARIN LEVEL (UNFRACTIONATED)
Heparin Unfractionated: 0.23 IU/mL — ABNORMAL LOW (ref 0.30–0.70)
Heparin Unfractionated: 0.42 IU/mL (ref 0.30–0.70)

## 2017-04-25 LAB — TROPONIN I
Troponin I: 0.46 ng/mL (ref <0.03)
Troponin I: 0.55 ng/mL (ref <0.03)
Troponin I: 0.42 ng/mL (ref <0.03)

## 2017-04-25 LAB — APTT: aPTT: >160 seconds (ref 24–36)

## 2017-04-25 MED ORDER — ASPIRIN EC 81 MG PO TBEC
81.0000 mg | DELAYED_RELEASE_TABLET | Freq: Every day | ORAL | Status: DC
  Administered 2017-04-25 – 2017-04-27 (×3): 81 mg via ORAL
  Filled 2017-04-25 (×3): qty 1

## 2017-04-25 MED ORDER — SODIUM CHLORIDE 0.9 % IV SOLN: 250.0000 mL | INTRAVENOUS | Status: DC | PRN

## 2017-04-25 MED ORDER — ENOXAPARIN SODIUM 40 MG/0.4ML ~~LOC~~ SOLN: 40.0000 mg | SUBCUTANEOUS | Status: DC

## 2017-04-25 MED ORDER — CARVEDILOL 6.25 MG PO TABS
3.1250 mg | ORAL_TABLET | Freq: Two times a day (BID) | ORAL | Status: DC
  Administered 2017-04-25: 3.125 mg via ORAL
  Filled 2017-04-25: qty 1

## 2017-04-25 MED ORDER — SODIUM CHLORIDE 0.9% FLUSH: 3.0000 mL | INTRAVENOUS | Status: DC | PRN

## 2017-04-25 MED ORDER — ACETAMINOPHEN 650 MG RE SUPP: 650.0000 mg | Freq: Four times a day (QID) | RECTAL | Status: DC | PRN

## 2017-04-25 MED ORDER — SOTALOL HCL 80 MG PO TABS
80.0000 mg | ORAL_TABLET | Freq: Two times a day (BID) | ORAL | Status: DC
  Administered 2017-04-25 – 2017-04-26 (×2): 80 mg via ORAL
  Filled 2017-04-25 (×3): qty 1

## 2017-04-25 MED ORDER — HEPARIN (PORCINE) IN NACL 100-0.45 UNIT/ML-% IJ SOLN
1200.0000 [IU]/h | INTRAMUSCULAR | Status: DC
  Administered 2017-04-25: 1200 [IU]/h via INTRAVENOUS
  Administered 2017-04-25: 1050 [IU]/h via INTRAVENOUS
  Administered 2017-04-26: 1200 [IU]/h via INTRAVENOUS
  Filled 2017-04-25 (×3): qty 250

## 2017-04-25 MED ORDER — SENNOSIDES-DOCUSATE SODIUM 8.6-50 MG PO TABS: 1.0000 | ORAL_TABLET | Freq: Every evening | ORAL | Status: DC | PRN

## 2017-04-25 MED ORDER — ACETAMINOPHEN 325 MG PO TABS: 650.0000 mg | ORAL_TABLET | Freq: Four times a day (QID) | ORAL | Status: DC | PRN

## 2017-04-25 MED ORDER — ONDANSETRON HCL 4 MG/2ML IJ SOLN: 4.0000 mg | Freq: Four times a day (QID) | INTRAMUSCULAR | Status: DC | PRN

## 2017-04-25 MED ORDER — LOSARTAN POTASSIUM 25 MG PO TABS
12.5000 mg | ORAL_TABLET | Freq: Every day | ORAL | Status: DC
  Administered 2017-04-25: 12.5 mg via ORAL
  Filled 2017-04-25: qty 1

## 2017-04-25 MED ORDER — ONDANSETRON HCL 4 MG PO TABS: 4.0000 mg | ORAL_TABLET | Freq: Four times a day (QID) | ORAL | Status: DC | PRN

## 2017-04-25 MED ORDER — SODIUM CHLORIDE 0.9% FLUSH
3.0000 mL | Freq: Two times a day (BID) | INTRAVENOUS | Status: DC
  Administered 2017-04-25 – 2017-04-26 (×3): 3 mL via INTRAVENOUS

## 2017-04-25 MED ORDER — HEPARIN BOLUS VIA INFUSION
4000.0000 [IU] | Freq: Once | INTRAVENOUS | Status: AC
  Administered 2017-04-25: 4000 [IU] via INTRAVENOUS
  Filled 2017-04-25: qty 4000

## 2017-04-25 MED ORDER — PANTOPRAZOLE SODIUM 40 MG PO TBEC
40.0000 mg | DELAYED_RELEASE_TABLET | Freq: Every day | ORAL | Status: DC
  Administered 2017-04-25 – 2017-04-27 (×3): 40 mg via ORAL
  Filled 2017-04-25 (×3): qty 1

## 2017-04-25 MED ORDER — MEXILETINE HCL 200 MG PO CAPS
200.0000 mg | ORAL_CAPSULE | Freq: Three times a day (TID) | ORAL | Status: DC
  Filled 2017-04-25 (×2): qty 1

## 2017-04-25 MED ORDER — HEPARIN BOLUS VIA INFUSION
1300.0000 [IU] | Freq: Once | INTRAVENOUS | Status: AC
  Administered 2017-04-25: 1300 [IU] via INTRAVENOUS
  Filled 2017-04-25: qty 1300

## 2017-04-25 NOTE — Progress Notes (Signed)
CRITICAL VALUE ALERT  Critical Value: PTT > 160.  Date & Time Notied:  04/25/17 0737 Provider Notified: Dr. Amado Coe  Orders Received/Actions taken: Contact pharmacy regarding the value.  Patient is on a heparin drip and they will review his dosing.

## 2017-04-25 NOTE — Progress Notes (Signed)
Patient had previously been on mexiletine 10/17-12/17. This was stopped 2/2 "I couldn't move my legs." We will stop mexiletine at this time (he did not receive any medication). We have a call out to his EP for further recommendations.

## 2017-04-25 NOTE — ED Provider Notes (Signed)
[facility] Emergency Department Provider Note   ____________________________________________   First MD Initiated Contact with Patient 04/24/17 2310     (approximate)  I have reviewed the triage vital signs and the nursing notes.   HISTORY  Chief Complaint Pacemaker Problem    HPI Roy Palmer is a 83 y.o. male who comes into the hospital today because his defibrillator shocked him.  The patient states that he was watching television and he started feeling a little dizzy like he was going to pass out.  He then states that his defibrillator shocked him.  He states that it is tied into [place].  It occurred around 9-930.  The patient's family states that his pacemaker has gone off about 5 times in the last 6 months but he is never been shocked.  The patient denies any nausea or vomiting and he denies any chest pain.  He denies having a big dinner and reports that he was just resting comfortably.  He has seen his cardiologist Dr. Ladona Ridgel recently and was told that if this occurred again he might need to be started on some new medication.  The patient states though that he does not tolerate medicines well.  He is here today for evaluation.  Denies any shortness of breath or leg swelling.  Patient denies any pain at this time.   Past Medical History:  Diagnosis Date  . AAA (abdominal aortic aneurysm) without rupture (HCC) 03/12/2016   Small - measured 3.3 x 3.5 cm by CTA  . CKD (chronic kidney disease) stage 3, GFR 30-59 ml/min (HCC)   . Essential hypertension   . Hyperlipidemia   . Hypertensive kidney disease   . Incidental pulmonary nodule 03/12/2016   Vague opacity right lung base requires radiographic follow up - index of suspicion is LOW  . Near syncope    Cardiogenic, related to an SVT and severe MR  . Non-sustained ventricular tachycardia (HCC)   . S/P minimally invasive mitral valve repair 03/19/2016   Complex valvuloplasty including triangular  resection of flail segment of posterior leaflet, chordal transposition x1, artificial Gore-tex neochord placement x4 and 34 mm Sorin Memo 3D ring annuloplasty via right mini thoracotomy approach with clipping of LA appendage  . Severe mitral regurgitation by prior echocardiogram 02/23/2016   Confirmed by TEE  . Syncope 03/15/2016  . Thoracic aortic aneurysm (HCC) 03/12/2016   Very very mild fusiform enlargement of distal transverse and proximal descending thoracic aorta noted on CTA  . Trigeminal neuralgia     Patient Active Problem List   Diagnosis Date Noted  . Chronic systolic heart failure due to valvular disease (HCC) 10/22/2016  . Non-ischemic cardiomyopathy (HCC) 05/07/2016  . Encounter for therapeutic drug monitoring 04/01/2016  . S/P minimally invasive mitral valve repair 03/19/2016  . Mild CAD 03/18/2016  . Essential hypertension 03/18/2016  . CKD (chronic kidney disease), stage III (HCC) 03/18/2016  . Hypertensive heart and kidney disease 03/18/2016  . Hyperlipidemia 03/18/2016  . Incidental pulmonary nodule 03/12/2016  . AAA (abdominal aortic aneurysm) without rupture (HCC) 03/12/2016  . Thoracic aortic aneurysm (HCC) 03/12/2016  . Ventricular tachycardia (HCC)   . Symptomatic PVCs 02/24/2016  . Severe mitral regurgitation 02/23/2016  . Near syncope -- cardiogenic 02/23/2016    Past Surgical History:  Procedure Laterality Date  . CARDIAC CATHETERIZATION N/A 02/23/2016   Procedure: Right/Left Heart Cath and Coronary Angiography;  Surgeon: [name], MD;  Location: [facility] INVASIVE CV LAB;  Service: Cardiovascular: Angiographic minimal coronary  disease. Normal left and right heart Pressures. Decreased CO/CI in settng of frequent PVCs & Severe MR.  Marland Kitchen CATARACT EXTRACTION, BILATERAL    . COLONOSCOPY  08/2005  . CYST EXCISION  10/2013   from neck  . EP IMPLANTABLE DEVICE N/A 03/27/2016   Procedure: ICD Implant Dual Chamber;  Surgeon: Marinus Maw, MD;  Location: [facility]  INVASIVE CV LAB;  Service: Cardiovascular;  Laterality: N/A;  . ESOPHAGOGASTRODUODENOSCOPY  02/2010  . MITRAL VALVE REPAIR Right 03/19/2016   Procedure: MINIMALLY INVASIVE MITRAL VALVE REPAIR (MVR);  Surgeon: [name], MD;  Location: [facility] OR;  Service: Open Heart Surgery;  Laterality: Right;  . TEE WITHOUT CARDIOVERSION N/A 02/23/2016   Procedure: TRANSESOPHAGEAL ECHOCARDIOGRAM (TEE);  Surgeon: Quintella Reichert, MD;  Location: [facility] ENDOSCOPY;  Service: Cardiovascular: Normal LV size and function.  Degenerative mitral valve disease with failed posterior leaflet (P2 segment), Severe MR with pulmonary vein systolic flow reversal. Moderate-TR.    . TEE WITHOUT CARDIOVERSION N/A 03/19/2016   Procedure: TRANSESOPHAGEAL ECHOCARDIOGRAM (TEE);  Surgeon: [name], MD;  Location: [facility] OR;  Service: Open Heart Surgery;  Laterality: N/A;  . TRANSTHORACIC ECHOCARDIOGRAM  02/14/2016   EF 50-55% with moderate LVH. Possible inferolateral hypokinesis. Moderate-severe MR with posterior leaflet prolapse, cannot rule out flail. Severe LA dilation. Moderate TR.    Prior to Admission medications   Medication Sig Start Date End Date Taking? Authorizing Provider  aspirin EC 81 MG tablet Take 1 tablet (81 mg total) by mouth daily. 02/09/16  Yes End, Cristal Deer, MD  carvedilol (COREG) 6.25 MG tablet Take 0.5 tablets (3.125 mg total) by mouth 2 (two) times daily. 11/15/16  Yes Dunn, Raymon Mutton, PA-C  losartan (COZAAR) 25 MG tablet Take 0.5 tablets (12.5 mg total) by mouth daily. 11/15/16  Yes Dunn, Raymon Mutton, PA-C  omeprazole (PRILOSEC) 20 MG capsule Take 20 mg daily by mouth.   Yes [provider]    Allergies Amiodarone and Lyrica [pregabalin]  Family History  Problem Relation Age of Onset  . Hypertension Mother   . Stroke Mother   . Heart disease Father   . Heart attack Father   . Dementia Sister   . Retinoblastoma Son   . COPD Neg Hx   . Cancer Neg Hx   . Diabetes Neg Hx     Social History Social  History   Tobacco Use  . Smoking status: Never Smoker  . Smokeless tobacco: Never Used  Substance Use Topics  . Alcohol use: No  . Drug use: No    Review of Systems  Constitutional: No fever/chills Eyes: No visual changes. ENT: No sore throat. Cardiovascular: Denies chest pain. Respiratory: Denies shortness of breath. Gastrointestinal: No abdominal pain.  No nausea, no vomiting.  No diarrhea.  No constipation. Genitourinary: Negative for dysuria. Musculoskeletal: Negative for back pain. Skin: Negative for rash. Neurological: Dizziness, lightheadedness, presyncope   ____________________________________________   PHYSICAL EXAM:  VITAL SIGNS: ED Triage Vitals  Enc Vitals Group     BP 04/24/17 2230 107/70     Pulse Rate 04/24/17 2209 89     Resp 04/24/17 2209 20     Temp 04/24/17 2209 98.2 F (36.8 C)     Temp src --      SpO2 04/24/17 2204 97 %     Weight 04/24/17 2212 198 lb (89.8 kg)     Height 04/24/17 2212 5\' 9"  (1.753 m)     Head Circumference --      Peak Flow --  Pain Score --      Pain Loc --      Pain Edu? --      Excl. in GC? --     Constitutional: Alert and oriented. Well appearing and in no acute distress. Eyes: Conjunctivae are normal. PERRL. EOMI. Head: Atraumatic. Nose: No congestion/rhinnorhea. Mouth/Throat: Mucous membranes are moist.  Oropharynx non-erythematous. Cardiovascular: Normal rate, regular rhythm. Grossly normal heart sounds.  Good peripheral circulation. Respiratory: Normal respiratory effort.  No retractions. Lungs CTAB. Gastrointestinal: Soft and nontender. No distention.  Positive bowel sounds Musculoskeletal: No lower extremity tenderness nor edema.  No joint effusions. Neurologic:  Normal speech and language. No gross focal neurologic deficits are appreciated. No gait instability. Skin:  Skin is warm, dry and intact. No rash noted. Psychiatric: Mood and affect are normal. Speech and behavior are  normal.  ____________________________________________   LABS (all labs ordered are listed, but only abnormal results are displayed)  Labs Reviewed  BASIC METABOLIC PANEL - Abnormal; Notable for the following components:      Result Value   Glucose, Bld 150 (*)    BUN 24 (*)    Creatinine, Ser 1.56 (*)    Calcium 8.1 (*)    GFR calc non Af Amer 39 (*)    GFR calc Af Amer 45 (*)    All other components within normal limits  CBC - Abnormal; Notable for the following components:   RBC 4.04 (*)    HCT 38.5 (*)    All other components within normal limits  TROPONIN I  MAGNESIUM   ____________________________________________  EKG  ED ECG REPORT I, Rebecka Apley, the attending physician, personally viewed and interpreted this ECG.   Date: 04/24/2017  EKG Time: 2210  Rate: 97  Rhythm: ventricular paced rhythym  Axis: left axis deviation  Intervals:wide complex ventricular conduction  ST&T Change: wide complex ventricular conduction  ____________________________________________  RADIOLOGY  Dg Chest 2 View  Result Date: 04/24/2017 CLINICAL DATA:  Chest pain after defibrillator deployed. EXAM: CHEST  2 VIEW COMPARISON:  None. FINDINGS: The heart size and mediastinal contours are within normal limits. Mild aortic atherosclerosis without aneurysm. Right atrial pacer and right ventricular defibrillator leads are noted with left-sided ICD device in place. Left atrial appendage clipping is noted. Mitral valvular repair is faintly visualized. Both lungs are clear. The visualized skeletal structures are unremarkable. IMPRESSION: No active cardiopulmonary disease. Electronically Signed   By: Tollie Eth M.D.   On: 04/24/2017 22:59    ____________________________________________   PROCEDURES  Procedure(s) performed: None  Procedures  Critical Care performed: No  ____________________________________________   INITIAL IMPRESSION / ASSESSMENT AND PLAN / ED COURSE  As  part of my medical decision making, I reviewed the following data within the electronic MEDICAL RECORD NUMBER Notes from prior ED visits and [place] Controlled Substance Database   This is an 83 year old man who comes into the hospital today after his defibrillator shocked him.  Patient has a history of ventricular tachycardia which is why he has this pacemaker and defibrillator.  We did interrogate the patient's Medtronic pacemaker and it shows that he did have one event of ventricular tachycardia/ventricular fibrillation and there was an intervention released and a shock released.  The event lasted more than 30 seconds and was more than 4 beats of greater than 150 bpm.  The shock was able to terminate the rhythm.  I checked the patient's blood work and it was unremarkable.  His creatinine is 1.56 but that is  within normal limits for the patient.  His troponin is 0.03 and his magnesium is 2.2.  I did contact cardiology and spoke to Dr. Duke Salviaandolph who felt that the patient should be admitted to the hospitalist service and restarted on his medication and monitored in the hospital as it can prolong her QTC and cause other complications.  I do agree with that plan.  I will admit the patient to the hospitalist service for defibrillator discharge and ventricular tachycardia.  The patient has no other concerns or complaints at this time.      ____________________________________________   FINAL CLINICAL IMPRESSION(S) / ED DIAGNOSES  Final diagnoses:  Ventricular tachycardia [facility](HCC)  Defibrillator discharge     ED Discharge Orders    None       Note:  This document was prepared using Dragon voice recognition software and may include unintentional dictation errors.    Rebecka ApleyWebster, Jeovanni Heuring P, MD 04/25/17 [phone]

## 2017-04-25 NOTE — Progress Notes (Signed)
[facility] - [place] at [facility]   PATIENT NAME: Roy Palmer    MR#:  [id number]  DATE OF BIRTH:  12/14/1929  SUBJECTIVE:  CHIEF COMPLAINT: Patient is resting comfortably.  Denies any palpitations.  Denies any chest pain or shortness of breath.  Denies any dizziness either  REVIEW OF SYSTEMS:  CONSTITUTIONAL: No fever, fatigue or weakness.  EYES: No blurred or double vision.  EARS, NOSE, AND THROAT: No tinnitus or ear pain.  RESPIRATORY: No cough, shortness of breath, wheezing or hemoptysis.  CARDIOVASCULAR: No chest pain, orthopnea, edema.  GASTROINTESTINAL: No nausea, vomiting, diarrhea or abdominal pain.  GENITOURINARY: No dysuria, hematuria.  ENDOCRINE: No polyuria, nocturia,  HEMATOLOGY: No anemia, easy bruising or bleeding SKIN: No rash or lesion. MUSCULOSKELETAL: No joint pain or arthritis.   NEUROLOGIC: No tingling, numbness, weakness.  PSYCHIATRY: No anxiety or depression.   DRUG ALLERGIES:   Allergies  Allergen Reactions  . Amiodarone Other (See Comments)    Terrible dreams/nightmares  . Lyrica [Pregabalin] Nausea And Vomiting    VITALS:  Blood pressure 135/75, pulse 74, temperature 98.3 F (36.8 C), temperature source Oral, resp. rate 16, height 5\' 11"  (1.803 m), weight 89.1 kg (196 lb 6.4 oz), SpO2 97 %.  PHYSICAL EXAMINATION:  GENERAL:  83 y.o.-year-old patient lying in the bed with no acute distress.  EYES: Pupils equal, round, reactive to light and accommodation. No scleral icterus. Extraocular muscles intact.  HEENT: Head atraumatic, normocephalic. Oropharynx and nasopharynx clear.  NECK:  Supple, no jugular venous distention. No thyroid enlargement, no tenderness.  LUNGS: Normal breath sounds bilaterally, no wheezing, rales,rhonchi or crepitation. No use of accessory muscles of respiration.  CARDIOVASCULAR: S1, S2 normal. No murmurs, rubs, or gallops.  ABDOMEN: Soft, nontender, nondistended. Bowel sounds present. No  organomegaly or mass.  EXTREMITIES: No pedal edema, cyanosis, or clubbing.  NEUROLOGIC: Cranial nerves II through XII are intact. Muscle strength 5/5 in all extremities. Sensation intact. Gait not checked.  PSYCHIATRIC: The patient is alert and oriented x 3.  SKIN: No obvious rash, lesion, or ulcer.    LABORATORY PANEL:   CBC Recent Labs  Lab 04/25/17 0420  WBC 7.6  HGB 12.9*  HCT 37.9*  PLT 176   ------------------------------------------------------------------------------------------------------------------  Chemistries  Recent Labs  Lab 04/24/17 2227 04/25/17 0420  NA 136 138  K 4.0 4.3  CL 105 107  CO2 22 23  GLUCOSE 150* 101*  BUN 24* 20  CREATININE 1.56* 1.29*  1.36*  CALCIUM 8.1* 8.5*  MG 2.2  --    ------------------------------------------------------------------------------------------------------------------  Cardiac Enzymes Recent Labs  Lab 04/25/17 1408  TROPONINI 0.42*   ------------------------------------------------------------------------------------------------------------------  RADIOLOGY:  Dg Chest 2 View  Result Date: 04/24/2017 CLINICAL DATA:  Chest pain after defibrillator deployed. EXAM: CHEST  2 VIEW COMPARISON:  None. FINDINGS: The heart size and mediastinal contours are within normal limits. Mild aortic atherosclerosis without aneurysm. Right atrial pacer and right ventricular defibrillator leads are noted with left-sided ICD device in place. Left atrial appendage clipping is noted. Mitral valvular repair is faintly visualized. Both lungs are clear. The visualized skeletal structures are unremarkable. IMPRESSION: No active cardiopulmonary disease. Electronically Signed   By: Tollie Ethavid  Kwon M.D.   On: 04/24/2017 22:59    EKG:   Orders placed or performed during the hospital encounter of 04/24/17  . EKG 12-Lead  . EKG 12-Lead  . ED EKG within 10 minutes  . ED EKG within 10 minutes    ASSESSMENT AND PLAN:  83 year old elderly  male patient with history of hypertension, hyperlipidemia, chronic kidney disease stage III, abdominal aortic aneurysm presented to the emergency room for dizziness after his defibrillator shocked him.   1. Ventricular arrhythmia Patient is admitted for sustained VT status post ICD shock on 04/24/2017 Get echocardiogram Cardiology is recommending EP eval which is pending at this time Continue aspirin, Coreg.  Continue Cozaar heparin drip discontinued  2. Hypertension Continue Coreg, Cozaar  3. Hyperlipidemia Check lipid panel  4. Chronic kidney disease stage III Monitor renal function, avoid nephrotoxins  DVT prophylaxis subcutaneous Lovenox 40 MG daily      All the records are reviewed and case discussed with Care Management/Social Workerr. Management plans discussed with the patient, family and they are in agreement.  CODE STATUS: fc   TOTAL TIME TAKING CARE OF THIS PATIENT: 35  minutes.   POSSIBLE D/C IN 1-2 DAYS, DEPENDING ON CLINICAL CONDITION.  Note: This dictation was prepared with Dragon dictation along with smaller phrase technology. Any transcriptional errors that result from this process are unintentional.   Ramonita Lab M.D on 04/25/2017 at 3:43 PM  Between 7am to 6pm - Pager - [phone] After 6pm go to [email or web address] - password [email or web address] [facility]  [place] [facility]  Office  [phone]  CC: Primary care physician; Gabriel Cirri, NP

## 2017-04-25 NOTE — Progress Notes (Signed)
ANTICOAGULATION CONSULT NOTE - Initial Consult  Pharmacy Consult for heparin Indication: chest pain/ACS  Allergies  Allergen Reactions  . Amiodarone Other (See Comments)    Terrible dreams/nightmares  . Lyrica [Pregabalin] Nausea And Vomiting    Patient Measurements: Height: 5\' 11"  (180.3 cm) Weight: 196 lb 6.4 oz (89.1 kg) IBW/kg (Calculated) : 75.3 Heparin Dosing Weight: 89.1 kg  Vital Signs: Temp: 98.3 F (36.8 C) (11/23 0323) Temp Source: Oral (11/23 0323) BP: 135/75 (11/23 0807) Pulse Rate: 74 (11/23 0807)  Labs: Recent Labs    04/24/17 2227 04/25/17 0420 04/25/17 0626 04/25/17 0854 04/25/17 1408  HGB 13.0 12.9*  --   --   --   HCT 38.5* 37.9*  --   --   --   PLT 176 176  --   --   --   APTT  --   --  >160*  --   --   LABPROT  --   --  14.2  --   --   INR  --   --  1.11  --   --   HEPARINUNFRC  --   --   --   --  0.23*  CREATININE 1.56* 1.29*  1.36*  --   --   --   TROPONINI <0.03 0.46*  --  0.55* 0.42*    Estimated Creatinine Clearance: 42.3 mL/min (A) (by C-G formula based on SCr of 1.36 mg/dL (H)).   Medical History: Past Medical History:  Diagnosis Date  . AAA (abdominal aortic aneurysm) without rupture (HCC) 03/12/2016   Small - measured 3.3 x 3.5 cm by CTA  . CKD (chronic kidney disease) stage 3, GFR 30-59 ml/min (HCC)   . Essential hypertension   . Hyperlipidemia   . Hypertensive kidney disease   . Incidental pulmonary nodule 03/12/2016   Vague opacity right lung base requires radiographic follow up - index of suspicion is LOW  . Near syncope    Cardiogenic, related to an SVT and severe MR  . Non-sustained ventricular tachycardia (HCC)   . S/P minimally invasive mitral valve repair 03/19/2016   Complex valvuloplasty including triangular resection of flail segment of posterior leaflet, chordal transposition x1, artificial Gore-tex neochord placement x4 and 34 mm Sorin Memo 3D ring annuloplasty via right mini thoracotomy approach with clipping  of LA appendage  . Severe mitral regurgitation by prior echocardiogram 02/23/2016   Confirmed by TEE  . Syncope 03/15/2016  . Thoracic aortic aneurysm (HCC) 03/12/2016   Very very mild fusiform enlargement of distal transverse and proximal descending thoracic aorta noted on CTA  . Trigeminal neuralgia     Medications:  Scheduled:  . aspirin EC  81 mg Oral Daily  . carvedilol  3.125 mg Oral BID  . heparin  1,300 Units Intravenous Once  . losartan  12.5 mg Oral Daily  . mexiletine  200 mg Oral Q8H  . pantoprazole  40 mg Oral Daily  . sodium chloride flush  3 mL Intravenous Q12H    Assessment: Patient admitted for pacemaker issue, pt has h/o vtach and also has a defibrillator, patient states he was shocked by pacemaker while watching tv and had a few runs of vtach. Trops were found to be elevated up to 0.46. Patient is now being started on a heparin drip  11/23 1400 Heparin level resulted at 0.23  Goal of Therapy:  Heparin level 0.3-0.7 units/ml Monitor platelets by anticoagulation protocol: Yes   Plan:  Will order Heparin bolus of 1300 units IV  and increase rate to 1200 units/hr. Will check next Heparin level in 8 hours. Daily CBC while on Heparin infusion.  Clovia CuffLisa Jamauri Kruzel, PharmD, BCPS 04/25/2017 3:24 PM

## 2017-04-25 NOTE — Progress Notes (Signed)
Troponin increased to 0.46. MD Pyreddy made aware. Heparin drip started at 10.5 ml/h with a 4000 ml bolus. Pt has no c/o chest pain.

## 2017-04-25 NOTE — H&P (Addendum)
[facility] - [place] at [facility]   PATIENT NAME: Roy Palmer    MR#:  [id number]  DATE OF BIRTH:  1930-05-20  DATE OF ADMISSION:  04/24/2017  PRIMARY CARE PHYSICIAN: Gabriel Cirri, NP   REQUESTING/REFERRING PHYSICIAN:   CHIEF COMPLAINT:   Chief Complaint  Patient presents with  . Pacemaker Problem    HISTORY OF PRESENT ILLNESS: Roy Palmer  is a 83 y.o. male with a known history of abdominal aortic aneurysm, chronic kidney disease stage III, hypertension, hyperlipidemia, nonsustained ventricular tachycardia, complex valvuloplasty presented to the emergency room after his defibrillator shocked him. Patient felt dizzy while watching television and felt as if he was passing out. Patient says his defibrillator shocked him. Patient has a pacemaker and defibrillator. Pacemaker was interrogated in the emergency room and he had an event of ventricular tachycardia and there was any intervention released and shock released. The shock was able to terminate the rhythm. Case was discussed by ER physician with cardiology on-call who recommended the patient for observation. No complaints of any chest pain. Patient has no palpitations and feels better now.   PAST MEDICAL HISTORY:   Past Medical History:  Diagnosis Date  . AAA (abdominal aortic aneurysm) without rupture (HCC) 03/12/2016   Small - measured 3.3 x 3.5 cm by CTA  . CKD (chronic kidney disease) stage 3, GFR 30-59 ml/min (HCC)   . Essential hypertension   . Hyperlipidemia   . Hypertensive kidney disease   . Incidental pulmonary nodule 03/12/2016   Vague opacity right lung base requires radiographic follow up - index of suspicion is LOW  . Near syncope    Cardiogenic, related to an SVT and severe MR  . Non-sustained ventricular tachycardia (HCC)   . S/P minimally invasive mitral valve repair 03/19/2016   Complex valvuloplasty including triangular resection of flail segment of posterior leaflet, chordal  transposition x1, artificial Gore-tex neochord placement x4 and 34 mm Sorin Memo 3D ring annuloplasty via right mini thoracotomy approach with clipping of LA appendage  . Severe mitral regurgitation by prior echocardiogram 02/23/2016   Confirmed by TEE  . Syncope 03/15/2016  . Thoracic aortic aneurysm (HCC) 03/12/2016   Very very mild fusiform enlargement of distal transverse and proximal descending thoracic aorta noted on CTA  . Trigeminal neuralgia     PAST SURGICAL HISTORY:  Past Surgical History:  Procedure Laterality Date  . CARDIAC CATHETERIZATION N/A 02/23/2016   Procedure: Right/Left Heart Cath and Coronary Angiography;  Surgeon: [name], MD;  Location: [facility] INVASIVE CV LAB;  Service: Cardiovascular: Angiographic minimal coronary disease. Normal left and right heart Pressures. Decreased CO/CI in settng of frequent PVCs & Severe MR.  Marland Kitchen CATARACT EXTRACTION, BILATERAL    . COLONOSCOPY  08/2005  . CYST EXCISION  10/2013   from neck  . EP IMPLANTABLE DEVICE N/A 03/27/2016   Procedure: ICD Implant Dual Chamber;  Surgeon: Marinus Maw, MD;  Location: [facility] INVASIVE CV LAB;  Service: Cardiovascular;  Laterality: N/A;  . ESOPHAGOGASTRODUODENOSCOPY  02/2010  . MITRAL VALVE REPAIR Right 03/19/2016   Procedure: MINIMALLY INVASIVE MITRAL VALVE REPAIR (MVR);  Surgeon: [name], MD;  Location: [facility] OR;  Service: Open Heart Surgery;  Laterality: Right;  . TEE WITHOUT CARDIOVERSION N/A 02/23/2016   Procedure: TRANSESOPHAGEAL ECHOCARDIOGRAM (TEE);  Surgeon: Quintella Reichert, MD;  Location: [facility] ENDOSCOPY;  Service: Cardiovascular: Normal LV size and function.  Degenerative mitral valve disease with failed posterior leaflet (P2 segment), Severe MR with pulmonary vein systolic  flow reversal. Moderate-TR.    . TEE WITHOUT CARDIOVERSION N/A 03/19/2016   Procedure: TRANSESOPHAGEAL ECHOCARDIOGRAM (TEE);  Surgeon: Purcell Nailslarence H Owen, MD;  [facility];  Service: Open Heart Surgery;  Laterality: N/A;  .  TRANSTHORACIC ECHOCARDIOGRAM  02/14/2016   EF 50-55% with moderate LVH. Possible inferolateral hypokinesis. Moderate-severe MR with posterior leaflet prolapse, cannot rule out flail. Severe LA dilation. Moderate TR.    SOCIAL HISTORY:  Social History   Tobacco Use  . Smoking status: Never Smoker  . Smokeless tobacco: Never Used  Substance Use Topics  . Alcohol use: No    FAMILY HISTORY:  Family History  Problem Relation Age of Onset  . Hypertension Mother   . Stroke Mother   . Heart disease Father   . Heart attack Father   . Dementia Sister   . Retinoblastoma Son   . COPD Neg Hx   . Cancer Neg Hx   . Diabetes Neg Hx     DRUG ALLERGIES:  Allergies  Allergen Reactions  . Amiodarone Other (See Comments)    Terrible dreams/nightmares  . Lyrica [Pregabalin] Nausea And Vomiting    REVIEW OF SYSTEMS:   CONSTITUTIONAL: No fever, fatigue or weakness.  EYES: No blurred or double vision.  EARS, NOSE, AND THROAT: No tinnitus or ear pain.  RESPIRATORY: No cough, shortness of breath, wheezing or hemoptysis.  CARDIOVASCULAR: No chest pain, orthopnea, edema.  GASTROINTESTINAL: No nausea, vomiting, diarrhea or abdominal pain.  GENITOURINARY: No dysuria, hematuria.  ENDOCRINE: No polyuria, nocturia,  HEMATOLOGY: No anemia, easy bruising or bleeding SKIN: No rash or lesion. MUSCULOSKELETAL: No joint pain or arthritis.   NEUROLOGIC: No tingling, numbness, weakness.  Had dizziness PSYCHIATRY: No anxiety or depression.   MEDICATIONS AT HOME:  Prior to Admission medications   Medication Sig Start Date End Date Taking? Authorizing Provider  aspirin EC 81 MG tablet Take 1 tablet (81 mg total) by mouth daily. 02/09/16  Yes End, Cristal Deerhristopher, MD  carvedilol (COREG) 6.25 MG tablet Take 0.5 tablets (3.125 mg total) by mouth 2 (two) times daily. 11/15/16  Yes Dunn, Raymon Muttonyan M, PA-C  losartan (COZAAR) 25 MG tablet Take 0.5 tablets (12.5 mg total) by mouth daily. 11/15/16  Yes Dunn, Raymon Muttonyan M, PA-C   omeprazole (PRILOSEC) 20 MG capsule Take 20 mg daily by mouth.   Yes [provider]      PHYSICAL EXAMINATION:   VITAL SIGNS: Blood pressure 128/74, pulse 76, temperature 98.2 F (36.8 C), resp. rate (!) 23, height 5\' 9"  (1.753 m), weight 89.8 kg (198 lb), SpO2 95 %.  GENERAL:  83 y.o.-year-old elderly male patient lying in the bed with no acute distress.  EYES: Pupils equal, round, reactive to light and accommodation. No scleral icterus. Extraocular muscles intact.  HEENT: Head atraumatic, normocephalic. Oropharynx and nasopharynx clear.  NECK:  Supple, no jugular venous distention. No thyroid enlargement, no tenderness.  LUNGS: Normal breath sounds bilaterally, no wheezing, rales,rhonchi or crepitation. No use of accessory muscles of respiration.  CARDIOVASCULAR: S1, S2 normal. No murmurs, rubs, or gallops.  ABDOMEN: Soft, nontender, nondistended. Bowel sounds present. No organomegaly or mass.  EXTREMITIES: No pedal edema, cyanosis, or clubbing.  NEUROLOGIC: Cranial nerves II through XII are intact. Muscle strength 5/5 in all extremities. Sensation intact. Gait not checked.  PSYCHIATRIC: The patient is alert and oriented x 3.  SKIN: No obvious rash, lesion, or ulcer.   LABORATORY PANEL:   CBC Recent Labs  Lab 04/24/17 2227  WBC 9.2  HGB 13.0  HCT 38.5*  PLT 176  MCV 95.4  MCH 32.1  MCHC 33.7  RDW 14.0   ------------------------------------------------------------------------------------------------------------------  Chemistries  Recent Labs  Lab 04/24/17 2227  NA 136  K 4.0  CL 105  CO2 22  GLUCOSE 150*  BUN 24*  CREATININE 1.56*  CALCIUM 8.1*  MG 2.2   ------------------------------------------------------------------------------------------------------------------ estimated creatinine clearance is 38.3 mL/min (A) (by C-G formula based on SCr of 1.56 mg/dL  (H)). ------------------------------------------------------------------------------------------------------------------ No results for input(s): TSH, T4TOTAL, T3FREE, THYROIDAB in the last 72 hours.  Invalid input(s): FREET3   Coagulation profile No results for input(s): INR, PROTIME in the last 168 hours. ------------------------------------------------------------------------------------------------------------------- No results for input(s): DDIMER in the last 72 hours. -------------------------------------------------------------------------------------------------------------------  Cardiac Enzymes Recent Labs  Lab 04/24/17 2227  TROPONINI <0.03   ------------------------------------------------------------------------------------------------------------------ Invalid input(s): POCBNP  ---------------------------------------------------------------------------------------------------------------  Urinalysis    Component Value Date/Time   COLORURINE YELLOW 03/14/2016 1710   APPEARANCEUR Clear 07/30/2016 1106   LABSPEC 1.019 03/14/2016 1710   PHURINE 5.5 03/14/2016 1710   GLUCOSEU Negative 07/30/2016 1106   HGBUR NEGATIVE 03/14/2016 1710   BILIRUBINUR Negative 07/30/2016 1106   KETONESUR NEGATIVE 03/14/2016 1710   PROTEINUR Negative 07/30/2016 1106   PROTEINUR NEGATIVE 03/14/2016 1710   NITRITE Negative 07/30/2016 1106   NITRITE NEGATIVE 03/14/2016 1710   LEUKOCYTESUR Negative 07/30/2016 1106     RADIOLOGY: Dg Chest 2 View  Result Date: 04/24/2017 CLINICAL DATA:  Chest pain after defibrillator deployed. EXAM: CHEST  2 VIEW COMPARISON:  None. FINDINGS: The heart size and mediastinal contours are within normal limits. Mild aortic atherosclerosis without aneurysm. Right atrial pacer and right ventricular defibrillator leads are noted with left-sided ICD device in place. Left atrial appendage clipping is noted. Mitral valvular repair is faintly visualized. Both lungs  are clear. The visualized skeletal structures are unremarkable. IMPRESSION: No active cardiopulmonary disease. Electronically Signed   By: Tollie Eth M.D.   On: 04/24/2017 22:59    EKG: Orders placed or performed during the hospital encounter of 04/24/17  . EKG 12-Lead  . EKG 12-Lead  . ED EKG within 10 minutes  . ED EKG within 10 minutes    IMPRESSION AND PLAN: 83 year old elderly male patient with history of hypertension, hyperlipidemia, chronic kidney disease stage III, abdominal aortic aneurysm presented to the emergency room for dizziness after his defibrillator shocked him.  Admitting diagnosis 1. Ventricular arrhythmia 2. Hypertension 3. Hyperlipidemia 4. Chronic kidney disease stage III Treatment plan Admit patient to telemetry observation bed Monitor for any new ventricular tachycardia Resume aspirin and beta blocker Cardiology consultation DVT prophylaxis subcutaneous Lovenox 40 MG daily  All the records are reviewed and case discussed with ED provider. Management plans discussed with the patient, family and they are in agreement.  CODE STATUS:FULL CODE    Code Status Orders  (From admission, onward)        Start     Ordered   04/25/17 0315  Full code  Continuous     04/25/17 0314    Code Status History    Date Active Date Inactive Code Status Order ID Comments User Context   03/12/2016 21:01 03/29/2016 16:46 Full Code [id number]  Janetta Hora, PA-C Inpatient    Advance Directive Documentation     Most Recent Value  Type of Advance Directive  Living will  Pre-existing out of facility DNR order (yellow form or pink MOST form)  No data  "MOST" Form in Place?  No data       TOTAL TIME  TAKING CARE OF THIS PATIENT: 50 minutes.    Ihor Austin M.D on 04/25/2017 at 3:21 AM  Between 7am to 6pm - Pager - [phone]  After 6pm go to [email or web address] - password [email or web address] [facility]  [place] [facility]  Office  [phone]  CC: Primary care  physician; Gabriel Cirri, NP

## 2017-04-25 NOTE — Progress Notes (Signed)
ANTICOAGULATION CONSULT NOTE - Initial Consult  Pharmacy Consult for heparin Indication: chest pain/ACS  Allergies  Allergen Reactions  . Amiodarone Other (See Comments)    Terrible dreams/nightmares  . Lyrica [Pregabalin] Nausea And Vomiting    Patient Measurements: Height: 5\' 11"  (180.3 cm) Weight: 196 lb 6.4 oz (89.1 kg) IBW/kg (Calculated) : 75.3 Heparin Dosing Weight: 89.1 kg  Vital Signs: Temp: 98.3 F (36.8 C) (11/23 0323) Temp Source: Oral (11/23 0323) BP: 145/98 (11/23 0323) Pulse Rate: 85 (11/23 0323)  Labs: Recent Labs    04/24/17 2227 04/25/17 0420  HGB 13.0 12.9*  HCT 38.5* 37.9*  PLT 176 176  CREATININE 1.56* 1.36*  TROPONINI <0.03 0.46*    Estimated Creatinine Clearance: 42.3 mL/min (A) (by C-G formula based on SCr of 1.36 mg/dL (H)).   Medical History: Past Medical History:  Diagnosis Date  . AAA (abdominal aortic aneurysm) without rupture (HCC) 03/12/2016   Small - measured 3.3 x 3.5 cm by CTA  . CKD (chronic kidney disease) stage 3, GFR 30-59 ml/min (HCC)   . Essential hypertension   . Hyperlipidemia   . Hypertensive kidney disease   . Incidental pulmonary nodule 03/12/2016   Vague opacity right lung base requires radiographic follow up - index of suspicion is LOW  . Near syncope    Cardiogenic, related to an SVT and severe MR  . Non-sustained ventricular tachycardia (HCC)   . S/P minimally invasive mitral valve repair 03/19/2016   Complex valvuloplasty including triangular resection of flail segment of posterior leaflet, chordal transposition x1, artificial Gore-tex neochord placement x4 and 34 mm Sorin Memo 3D ring annuloplasty via right mini thoracotomy approach with clipping of LA appendage  . Severe mitral regurgitation by prior echocardiogram 02/23/2016   Confirmed by TEE  . Syncope 03/15/2016  . Thoracic aortic aneurysm (HCC) 03/12/2016   Very very mild fusiform enlargement of distal transverse and proximal descending thoracic aorta  noted on CTA  . Trigeminal neuralgia     Medications:  Scheduled:  . aspirin EC  81 mg Oral Daily  . carvedilol  3.125 mg Oral BID  . heparin  4,000 Units Intravenous Once  . losartan  12.5 mg Oral Daily  . pantoprazole  40 mg Oral Daily  . sodium chloride flush  3 mL Intravenous Q12H    Assessment: Patient admitted for pacemaker issue, pt has h/o vtach and also has a defibrillator, patient states he was shocked by pacemaker while watching tv and had a few runs of vtach. Trops were found to be elevated up to 0.46. Patient is now being started on a heparin drip  Goal of Therapy:  Heparin level 0.3-0.7 units/ml Monitor platelets by anticoagulation protocol: Yes   Plan:  Will bolus w/ heparin 4000 units IV x 1 Will start drip @ 1050 units/hr  Will check an anti-Xa level @ 1400 Baseline labs drawn Will monitor daily CBC and adjust based on anti-Xa's  Thomasene Ripple, PharmD, BCPS Clinical Pharmacist 04/25/2017

## 2017-04-25 NOTE — Care Management Obs Status (Signed)
MEDICARE OBSERVATION STATUS NOTIFICATION   Patient Details  Name: Roy Palmer MRN: [id number] Date of Birth: 01/28/1930   Medicare Observation Status Notification Given:  Yes Notice signed, one given to patient and the other to HIM for scanning   Eber Hong, RN 04/25/2017, 11:46 AM

## 2017-04-25 NOTE — Consult Note (Signed)
Cardiology Consultation:   Patient ID: Roy Palmer; [id number]; 06-Oct-1929   Admit date: 04/24/2017 Date of Consult: 04/25/2017  Primary Care Provider: Gabriel Cirri, NP Primary Cardiologist: End Primary Electrophysiologist:  Ladona Ridgel   Patient Profile:   Roy Palmer is a 83 y.o. male with a hx of severe mitral regurgitation status post minimally invasive mitral valve repair and 03/19/2016 with left atrial appendage clipping, syncope with sustained ventricular tachycardia status post Medtronic dual chamber ICD on 03/27/2016, nonobstructive CAD by [facility] 02/2016, nonischemic cardiomyopathy with most recent EF of 25-30% by echo in 02/2017, history of frequent PVCs/prior NSVT intolerant to amiodarone secondary to hallucinations, prior baseline sinus bradycardia in the 50s, hypertension, hyperlipidemia, orthostatic hypotension, and CKD stage II-III who is being seen today for the evaluation of ICD shock at the request of Dr. Tobi Bastos.  History of Present Illness:   Roy Palmer was seen by Dr. Ladona Ridgel on 04/16/17 and was noted to have increased VT burden that had been successfully treated with ATP (12 separate episodes in October, 2018). It was mentioned he may need mexiletine or sotalol if his ventricular ectopy burden persisted.   Patient reports being in his usual state of health until the evening of 11/22. He had just sat down on the sofa to watch football when he suddenly felt dizzy, like he was going to pass out. This lasted for a few seconds and was followed by ICD shock delivery. Following this, the patient felt back to his baseline, though with a sore chest. No preceding chest pain, palpitations, diaphoresis, nausea, or vomiting. Because of the ICD shock delivery, he presented to [facility].   Upon the patient's arrival to [facility] they were found to have BP 107/70, HR 95 bpm, temp 98.2, oxygen saturation 97% on room air, weight 198 pounds. EKG showed V-paced rhythm, 97 bpm, CXR showed no  active cardiopulmonary disease. Labs showed magnesium 2.2, K+ 4.0-->4.3, SCr 1.56-->1.29,  Troponin <0.03-->0.46-->0.55, TSH 0.718, WBC 9.2, HGB 13.0, PLT 176. Review of device interrogation in the ED on 04/25/17 showed 2 episodes of VT treated since 04/16/17 (one successfully treated with ATP on 04/21/17, lasting 11 seconds and another treated with defibrillation on 04/24/17, lasting 44 seconds) also 1 episode of NSVT monitored on 04/20/17. He was started on a heparin gtt upon admission. Currently, feels at his baseline. No further ICD shocks since admission.    Past Medical History:  Diagnosis Date  . AAA (abdominal aortic aneurysm) without rupture (HCC) 03/12/2016   Small - measured 3.3 x 3.5 cm by CTA  . CKD (chronic kidney disease) stage 3, GFR 30-59 ml/min (HCC)   . Essential hypertension   . Hyperlipidemia   . Hypertensive kidney disease   . Incidental pulmonary nodule 03/12/2016   Vague opacity right lung base requires radiographic follow up - index of suspicion is LOW  . Near syncope    Cardiogenic, related to an SVT and severe MR  . Non-sustained ventricular tachycardia (HCC)   . S/P minimally invasive mitral valve repair 03/19/2016   Complex valvuloplasty including triangular resection of flail segment of posterior leaflet, chordal transposition x1, artificial Gore-tex neochord placement x4 and 34 mm Sorin Memo 3D ring annuloplasty via right mini thoracotomy approach with clipping of LA appendage  . Severe mitral regurgitation by prior echocardiogram 02/23/2016   Confirmed by TEE  . Syncope 03/15/2016  . Thoracic aortic aneurysm (HCC) 03/12/2016   Very very mild fusiform enlargement of distal transverse and proximal descending thoracic aorta noted  on CTA  . Trigeminal neuralgia     Past Surgical History:  Procedure Laterality Date  . CARDIAC CATHETERIZATION N/A 02/23/2016   Procedure: Right/Left Heart Cath and Coronary Angiography;  Surgeon: Yvonne Kendallhristopher End, MD;  [facility];  Service: Cardiovascular: Angiographic minimal coronary disease. Normal left and right heart Pressures. Decreased CO/CI in settng of frequent PVCs & Severe MR.  Marland Kitchen. CATARACT EXTRACTION, BILATERAL    . COLONOSCOPY  08/2005  . CYST EXCISION  10/2013   from neck  . EP IMPLANTABLE DEVICE N/A 03/27/2016   Procedure: ICD Implant Dual Chamber;  Surgeon: Marinus MawGregg W Taylor, MD;  [facility];  Service: Cardiovascular;  Laterality: N/A;  . ESOPHAGOGASTRODUODENOSCOPY  02/2010  . MITRAL VALVE REPAIR Right 03/19/2016   Procedure: MINIMALLY INVASIVE MITRAL VALVE REPAIR (MVR);  Surgeon: Purcell Nailslarence H Owen, MD;  [facility];  Service: Open Heart Surgery;  Laterality: Right;  . TEE WITHOUT CARDIOVERSION N/A 02/23/2016   Procedure: TRANSESOPHAGEAL ECHOCARDIOGRAM (TEE);  Surgeon: Quintella Reichertraci R Turner, MD;  [facility];  Service: Cardiovascular: Normal LV size and function.  Degenerative mitral valve disease with failed posterior leaflet (P2 segment), Severe MR with pulmonary vein systolic flow reversal. Moderate-TR.    . TEE WITHOUT CARDIOVERSION N/A 03/19/2016   Procedure: TRANSESOPHAGEAL ECHOCARDIOGRAM (TEE);  Surgeon: Purcell Nailslarence H Owen, MD;  [facility];  Service: Open Heart Surgery;  Laterality: N/A;  . TRANSTHORACIC ECHOCARDIOGRAM  02/14/2016   EF 50-55% with moderate LVH. Possible inferolateral hypokinesis. Moderate-severe MR with posterior leaflet prolapse, cannot rule out flail. Severe LA dilation. Moderate TR.     Home Meds: Prior to Admission medications   Medication Sig Start Date End Date Taking? Authorizing Provider  aspirin EC 81 MG tablet Take 1 tablet (81 mg total) by mouth daily. 02/09/16  Yes End, Cristal Deerhristopher, MD  carvedilol (COREG) 6.25 MG tablet Take 0.5 tablets (3.125 mg total) by mouth 2 (two) times daily. 11/15/16  Yes Willy Vorce, Raymon Muttonyan M, PA-C  losartan (COZAAR) 25 MG tablet Take 0.5 tablets (12.5 mg total) by mouth daily. 11/15/16  Yes Jaylin Benzel, Raymon Muttonyan M, PA-C    omeprazole (PRILOSEC) 20 MG capsule Take 20 mg daily by mouth.   Yes [provider]    Inpatient Medications: Scheduled Meds: . aspirin EC  81 mg Oral Daily  . carvedilol  3.125 mg Oral BID  . losartan  12.5 mg Oral Daily  . pantoprazole  40 mg Oral Daily  . sodium chloride flush  3 mL Intravenous Q12H   Continuous Infusions: . sodium chloride    . heparin 1,050 Units/hr (04/25/17 0618)   PRN Meds: sodium chloride, acetaminophen **OR** acetaminophen, ondansetron **OR** ondansetron (ZOFRAN) IV, senna-docusate, sodium chloride flush  Allergies:   Allergies  Allergen Reactions  . Amiodarone Other (See Comments)    Terrible dreams/nightmares  . Lyrica [Pregabalin] Nausea And Vomiting    Social History:   Social History   Socioeconomic History  . Marital status: Married    Spouse name: Not on file  . Number of children: Not on file  . Years of education: Not on file  . Highest education level: Not on file  Social Needs  . Financial resource strain: Not on file  . Food insecurity - worry: Not on file  . Food insecurity - inability: Not on file  . Transportation needs - medical: Not on file  . Transportation needs - non-medical: Not on file  Occupational History  . Occupation: retired  Tobacco Use  .  Smoking status: Never Smoker  . Smokeless tobacco: Never Used  Substance and Sexual Activity  . Alcohol use: No  . Drug use: No  . Sexual activity: No  Other Topics Concern  . Not on file  Social History Narrative  . Not on file     Family History:   Family History  Problem Relation Age of Onset  . Hypertension Mother   . Stroke Mother   . Heart disease Father   . Heart attack Father   . Dementia Sister   . Retinoblastoma Son   . COPD Neg Hx   . Cancer Neg Hx   . Diabetes Neg Hx     ROS:  Review of Systems  Constitutional: Positive for malaise/fatigue. Negative for chills, diaphoresis, fever and weight loss.  HENT: Negative for congestion.    Eyes: Negative for discharge and redness.  Respiratory: Negative for cough, hemoptysis, sputum production, shortness of breath and wheezing.   Cardiovascular: Positive for palpitations. Negative for chest pain, orthopnea, claudication, leg swelling and PND.  Gastrointestinal: Negative for abdominal pain, blood in stool, constipation, diarrhea, heartburn, melena, nausea and vomiting.  Genitourinary: Negative for hematuria.  Musculoskeletal: Negative for falls and myalgias.  Skin: Negative for rash.  Neurological: Positive for dizziness and weakness. Negative for tingling, tremors, sensory change, speech change, focal weakness, seizures, loss of consciousness and headaches.  Endo/Heme/Allergies: Does not bruise/bleed easily.  Psychiatric/Behavioral: Negative for substance abuse. The patient is not nervous/anxious.   All other systems reviewed and are negative.     Physical Exam/Data:   Vitals:   04/25/17 0230 04/25/17 0300 04/25/17 0323 04/25/17 0807  BP: 140/71 128/74 (!) 145/98 135/75  Pulse: 74 76 85 74  Resp: 20 (!) 23 17 16   Temp:   98.3 F (36.8 C)   TempSrc:   Oral   SpO2: 95% 95% 99% 97%  Weight:   196 lb 6.4 oz (89.1 kg)   Height:   5\' 11"  (1.803 m)     Intake/Output Summary (Last 24 hours) at 04/25/2017 0836 Last data filed at 04/25/2017 0700 Gross per 24 hour  Intake 7.35 ml  Output -  Net 7.35 ml   Filed Weights   04/24/17 2212 04/25/17 0323  Weight: 198 lb (89.8 kg) 196 lb 6.4 oz (89.1 kg)   Body mass index is 27.39 kg/m.   Physical Exam: General: Well developed, well nourished, in no acute distress. Head: Normocephalic, atraumatic, sclera non-icteric, no xanthomas, nares without discharge.  Neck: Negative for carotid bruits. JVD not elevated. Lungs: Clear bilaterally to auscultation without wheezes, rales, or rhonchi. Breathing is unlabored. Heart: RRR with S1 S2. No murmurs, rubs, or gallops appreciated. Abdomen: Soft, non-tender, non-distended with  normoactive bowel sounds. No hepatomegaly. No rebound/guarding. No obvious abdominal masses. Msk:  Strength and tone appear normal for age. Extremities: No clubbing or cyanosis. No edema. Distal pedal pulses are 2+ and equal bilaterally. Neuro: Alert and oriented X 3. No facial asymmetry. No focal deficit. Moves all extremities spontaneously. Psych:  Responds to questions appropriately with a normal affect.   EKG:  The EKG was personally reviewed and demonstrates: V-paced rhythm, 97 bpm Telemetry:  Telemetry was personally reviewed and demonstrates: V-paced, occasional PVCs  Weights: [facility] Weights   04/24/17 2212 04/25/17 0323  Weight: 198 lb (89.8 kg) 196 lb 6.4 oz (89.1 kg)    Relevant CV Studies: ICD interrogation 04/25/2017:  -2 episodes of VT treated since 04/16/17 (one successfully treated with ATP on 04/21/17, lasting 11 seconds  and another treated with defibrillation on 04/24/17, lasting 44 seconds) also 1 episode of NSVT monitored on 04/20/17.  Cath/PCI:  LHC/RHC (02/23/16): Mild, nonobstructive CAD (30% ostial LMCA, 30% ostial LAD, 30% ostial ramus, and minimal luminal irregularities of dominant LCx. Normal right heart filling pressures. Decreased Fick cardiac output (CO 3.6 L/m, CI 1.8 L/m/m) in setting of frequent PVCs and severe MR.  CV Surgery:  Minimally invasive mitral valve repair (03/19/16, Dr. Cornelius Moras)  EP Procedures and Devices:  24-hour Holter monitor (02/27/16): Predominantly sinus rhythm with average heart rate is 63 bpm (range 49-91 bpm). Longest RR interval 1.6 seconds. Frequent PVCs noted (11% burden) including frequent couplets and bigeminal cycles. 90 runs of NSVT were noted lasting up to 9 beats the maximum rate of 134 bpm. Rare SVT and supraventricular ectopy noted.  Dual chamber ICD (03/27/16, Medtronic)  Non-Invasive Evaluation(s):  TTE (09/03/16): Moderately dilated left ventricle with LVEF less than 20% and global hypokinesis. Prior surgical repair  of mitral valve is evident with moderate regurgitation. Left atrium is moderately dilated. RV size and function are normal. Moderate pulmonary hypertension noted with RVSP of 54 mmHg.  Limited TTE (03/22/16): Normal LV size with moderate LVH and LVEF of 40-45% with possible inferior hypokinesis and incoordinate septal motion. Mitral angioplasty ring in place with trivial MR. No significant mitral stenosis. Mild left atrial enlargement. Normal RV was moderately reduced systolic function. RVSP 41 mm or mercury. Elevated central venous pressure.  TTE (02/14/16): Normal LV size with moderate LVH. EF mildly reduced at 50% with possible inferolateral hypokinesis. Moderate to severe MR noted with prolapse/flail of the posterior leaflet. Severe left atrial enlargement. Moderate TR.  TEE (02/23/16): Normal LV size and wall thickness with lower normal LVEF. Trivial AI. Severe, eccentric MR with reversal of pulmonary vein flow and flail posterior leaflet. No left atrial thrombus. Lipomatous hypertrophy of the septum. Mild to moderate TR.  Limited TTE (03/22/16): Normal LV size with moderate LVH. LVEF 40-45% with possible inferior hypokinesis. Trivial MR with mild left atrial enlargement. Normal RV size with moderately reduced systolic function. Mild TR. No pericardial effusion.   Laboratory Data:  Chemistry Recent Labs  Lab 04/24/17 2227 04/25/17 0420  NA 136 138  K 4.0 4.3  CL 105 107  CO2 22 23  GLUCOSE 150* 101*  BUN 24* 20  CREATININE 1.56* 1.29*  1.36*  CALCIUM 8.1* 8.5*  GFRNONAA 39* 49*  46*  GFRAA 45* 57*  53*  ANIONGAP 9 8    No results for input(s): PROT, ALBUMIN, AST, ALT, ALKPHOS, BILITOT in the last 168 hours. Hematology Recent Labs  Lab 04/24/17 2227 04/25/17 0420  WBC 9.2 7.6  RBC 4.04* 4.01*  HGB 13.0 12.9*  HCT 38.5* 37.9*  MCV 95.4 94.5  MCH 32.1 32.3  MCHC 33.7 34.2  RDW 14.0 13.9  PLT 176 176   Cardiac Enzymes Recent Labs  Lab 04/24/17 2227 04/25/17 0420    TROPONINI <0.03 0.46*   No results for input(s): TROPIPOC in the last 168 hours.  BNPNo results for input(s): BNP, PROBNP in the last 168 hours.  DDimer No results for input(s): DDIMER in the last 168 hours.  Radiology/Studies:  Dg Chest 2 View  Result Date: 04/24/2017 IMPRESSION: No active cardiopulmonary disease. Electronically Signed   By: Tollie Eth M.D.   On: 04/24/2017 22:59    Assessment and Plan:   1. Sustained VT s/p out of hospital ICD shock on 04/24/2017: -Successful appropriate ICD shock on 11/22 for VT -Successful  ATP 11/19 -Per EP note on 04/16/17 consider mexiletine vs sotalol -We contacted the patient's primary EP MD who recommends starting mexiletine 200 mg q 8 hours and monitoring on telemetry for another 24 hours -Continue Coreg 3.125 mg bid (hesistant to titrate given prior issues with orthostasis) -Echo pending -No driving at this time  -Potassium and magnesium at goal -Check TSH -Monitor on telemetry   2. Elevated troponin: -No chest pain -Most recent value trending up to 0.55, continue to cycle until peak -Heparin gtt -Likely in the setting of ICD shock as above -May need ischemic evaluation with LHC prior to discharge given increased ventricular ectopy and elevated troponin  -Echo pending  3. NICM: -He does not appear volume overloaded -Echo pending -Coreg and losartan  -Has been unable to transition from [facility] to [facility] given issues with hypotension -Not on spironolactone given hypotension and CKD -CHF education -Daily weights/strict Is and Os  4. Status post mitral valve repair: -Outpatient follow up  5. CKD stage II-III: -Stable -Monitor    For questions or updates, please contact [facility] Please consult [email or web address] for contact info under Cardiology/STEMI.   Signed, Eula Listen, PA-C [facility] Pager: [phone] 04/25/2017, 8:36 AM

## 2017-04-26 DIAGNOSIS — I472 Ventricular tachycardia, unspecified: Secondary | ICD-10-CM

## 2017-04-26 DIAGNOSIS — I5022 Chronic systolic (congestive) heart failure: Secondary | ICD-10-CM

## 2017-04-26 DIAGNOSIS — N183 Chronic kidney disease, stage 3 (moderate): Secondary | ICD-10-CM | POA: Diagnosis present

## 2017-04-26 DIAGNOSIS — Z7982 Long term (current) use of aspirin: Secondary | ICD-10-CM | POA: Diagnosis not present

## 2017-04-26 DIAGNOSIS — E785 Hyperlipidemia, unspecified: Secondary | ICD-10-CM | POA: Diagnosis present

## 2017-04-26 DIAGNOSIS — Z8249 Family history of ischemic heart disease and other diseases of the circulatory system: Secondary | ICD-10-CM | POA: Diagnosis not present

## 2017-04-26 DIAGNOSIS — I428 Other cardiomyopathies: Secondary | ICD-10-CM | POA: Diagnosis present

## 2017-04-26 DIAGNOSIS — Z79899 Other long term (current) drug therapy: Secondary | ICD-10-CM | POA: Diagnosis not present

## 2017-04-26 DIAGNOSIS — Z4502 Encounter for adjustment and management of automatic implantable cardiac defibrillator: Secondary | ICD-10-CM | POA: Diagnosis not present

## 2017-04-26 DIAGNOSIS — R42 Dizziness and giddiness: Secondary | ICD-10-CM | POA: Diagnosis present

## 2017-04-26 DIAGNOSIS — I248 Other forms of acute ischemic heart disease: Secondary | ICD-10-CM | POA: Diagnosis present

## 2017-04-26 DIAGNOSIS — I130 Hypertensive heart and chronic kidney disease with heart failure and stage 1 through stage 4 chronic kidney disease, or unspecified chronic kidney disease: Secondary | ICD-10-CM | POA: Diagnosis present

## 2017-04-26 DIAGNOSIS — Z888 Allergy status to other drugs, medicaments and biological substances status: Secondary | ICD-10-CM | POA: Diagnosis not present

## 2017-04-26 DIAGNOSIS — I2510 Atherosclerotic heart disease of native coronary artery without angina pectoris: Secondary | ICD-10-CM | POA: Diagnosis present

## 2017-04-26 LAB — LIPID PANEL
Cholesterol: 145 mg/dL (ref 0–200)
HDL: 35 mg/dL — ABNORMAL LOW (ref >40)
LDL Cholesterol: 94 mg/dL (ref 0–99)
Total CHOL/HDL Ratio: 4.1 RATIO
Triglycerides: 81 mg/dL (ref <150)
VLDL: 16 mg/dL (ref 0–40)

## 2017-04-26 LAB — BASIC METABOLIC PANEL
Anion gap: 7 (ref 5–15)
BUN: 18 mg/dL (ref 6–20)
CO2: 23 mmol/L (ref 22–32)
Calcium: 8.3 mg/dL — ABNORMAL LOW (ref 8.9–10.3)
Chloride: 107 mmol/L (ref 101–111)
Creatinine, Ser: 1.29 mg/dL — ABNORMAL HIGH (ref 0.61–1.24)
GFR calc Af Amer: 57 mL/min — ABNORMAL LOW (ref >60)
GFR calc non Af Amer: 49 mL/min — ABNORMAL LOW (ref >60)
Glucose, Bld: 101 mg/dL — ABNORMAL HIGH (ref 65–99)
Potassium: 4.5 mmol/L (ref 3.5–5.1)
Sodium: 137 mmol/L (ref 135–145)

## 2017-04-26 LAB — CBC
HCT: 38.1 % — ABNORMAL LOW (ref 40.0–52.0)
Hemoglobin: 13.2 g/dL (ref 13.0–18.0)
MCH: 32.9 pg (ref 26.0–34.0)
MCHC: 34.7 g/dL (ref 32.0–36.0)
MCV: 94.9 fL (ref 80.0–100.0)
Platelets: 190 10*3/uL (ref 150–440)
RBC: 4.01 MIL/uL — ABNORMAL LOW (ref 4.40–5.90)
RDW: 13.6 % (ref 11.5–14.5)
WBC: 7.7 10*3/uL (ref 3.8–10.6)

## 2017-04-26 LAB — BODY FLUID CULTURE

## 2017-04-26 LAB — HEPARIN LEVEL (UNFRACTIONATED): Heparin Unfractionated: 0.48 IU/mL (ref 0.30–0.70)

## 2017-04-26 MED ORDER — SOTALOL HCL 80 MG PO TABS
120.0000 mg | ORAL_TABLET | Freq: Two times a day (BID) | ORAL | Status: DC
  Administered 2017-04-26 – 2017-04-27 (×2): 120 mg via ORAL
  Filled 2017-04-26 (×2): qty 1
  Filled 2017-04-26: qty 1.5

## 2017-04-26 NOTE — Progress Notes (Signed)
Progress Note  Patient Name: Roy Palmer Date of Encounter: 04/26/2017  Primary Cardiologist: Ladona Ridgelaylor, End    Subjective   No Dizziness  No CP  No SOB    Inpatient Medications    Scheduled Meds: . aspirin EC  81 mg Oral Daily  . pantoprazole  40 mg Oral Daily  . sodium chloride flush  3 mL Intravenous Q12H  . sotalol  80 mg Oral Q12H   Continuous Infusions: . sodium chloride    . heparin 1,200 Units/hr (04/25/17 2101)   PRN Meds: sodium chloride, acetaminophen **OR** acetaminophen, ondansetron **OR** ondansetron (ZOFRAN) IV, senna-docusate, sodium chloride flush   Vital Signs    Vitals:   04/25/17 0807 04/25/17 1930 04/26/17 0342 04/26/17 0755  BP: 135/75 126/61 129/64 (!) 145/71  Pulse: 74 71 73 64  Resp: 16 18    Temp:   98.2 F (36.8 C)   TempSrc:   Oral   SpO2: 97% 97% 94% 96%  Weight:      Height:        Intake/Output Summary (Last 24 hours) at 04/26/2017 0933 Last data filed at 04/26/2017 [id number] Gross per 24 hour  Intake 868.43 ml  Output 1700 ml  Net -831.57 ml   Filed Weights   04/24/17 2212 04/25/17 0323  Weight: 198 lb (89.8 kg) 196 lb 6.4 oz (89.1 kg)    Telemetry    Paced  - Personally Reviewed  ECG      Physical Exam   GEN: No acute distress.   Neck: No JVD Cardiac: RRR, no murmurs, rubs, or gallops.  Respiratory: Clear to auscultation bilaterally. GI: Soft, nontender, non-distended  MS: No edema; No deformity. Neuro:  Nonfocal  Psych: Normal affect   Labs    Chemistry Recent Labs  Lab 04/24/17 2227 04/25/17 0420 04/26/17 0529  NA 136 138 137  K 4.0 4.3 4.5  CL 105 107 107  CO2 22 23 23   GLUCOSE 150* 101* 101*  BUN 24* 20 18  CREATININE 1.56* 1.29*  1.36* 1.29*  CALCIUM 8.1* 8.5* 8.3*  GFRNONAA 39* 49*  46* 49*  GFRAA 45* 57*  53* 57*  ANIONGAP 9 8 7      Hematology Recent Labs  Lab 04/24/17 2227 04/25/17 0420 04/26/17 0529  WBC 9.2 7.6 7.7  RBC 4.04* 4.01* 4.01*  HGB 13.0 12.9* 13.2  HCT 38.5*  37.9* 38.1*  MCV 95.4 94.5 94.9  MCH 32.1 32.3 32.9  MCHC 33.7 34.2 34.7  RDW 14.0 13.9 13.6  PLT 176 176 190    Cardiac Enzymes Recent Labs  Lab 04/24/17 2227 04/25/17 0420 04/25/17 0854 04/25/17 1408  TROPONINI <0.03 0.46* 0.55* 0.42*   No results for input(s): TROPIPOC in the last 168 hours.   BNPNo results for input(s): BNP, PROBNP in the last 168 hours.   DDimer No results for input(s): DDIMER in the last 168 hours.   Radiology    Dg Chest 2 View  Result Date: 04/24/2017 CLINICAL DATA:  Chest pain after defibrillator deployed. EXAM: CHEST  2 VIEW COMPARISON:  None. FINDINGS: The heart size and mediastinal contours are within normal limits. Mild aortic atherosclerosis without aneurysm. Right atrial pacer and right ventricular defibrillator leads are noted with left-sided ICD device in place. Left atrial appendage clipping is noted. Mitral valvular repair is faintly visualized. Both lungs are clear. The visualized skeletal structures are unremarkable. IMPRESSION: No active cardiopulmonary disease. Electronically Signed   By: Tollie Ethavid  Kwon M.D.   On: 04/24/2017 22:59  Cardiac Studies     Patient Profile         Roy Palmer is a 83 y.o. male with a hx of severe mitral regurgitation status post minimally invasive mitral valve repair and 03/19/2016 with left atrial appendage clipping, syncope with sustained ventricular tachycardia status post Medtronic dual chamber ICD on 03/27/2016, nonobstructive CAD by [facility] 02/2016, nonischemic cardiomyopathy with most recent EF of 25-30% by echo in 02/2017, history of frequent PVCs/prior NSVT intolerant to amiodarone secondary to hallucinations, prior baseline sinus bradycardia in the 50s, hypertension, hyperlipidemia, orthostatic hypotension, and CKD stage II-III who is being seen today for the evaluation of ICD shock at the request of Dr. Tobi Bastos.     Assessment & Plan    1  VT  Intolerant to mexilitene and amiodarone   Now on  sotalol 80 bid  Plan to increase to 120 mg bid    FOllow on tele and get EKG after AM dose tomorrow  Probable d/c tomorrow    2  Chronic systolic CHF   LVEF 25 to 30%  Nonischemic  VOlume OK    3  HTN  BP OK      S/p repair  5  HL     6 Hx orthostatic hypotension  Denies dizziness  For questions or updates, please contact [facility] Please consult [email or web address] for contact info under Cardiology/STEMI.      Signed, Dietrich Pates, MD  04/26/2017, 9:33 AM

## 2017-04-26 NOTE — Progress Notes (Signed)
ANTICOAGULATION CONSULT NOTE - FOLLOW UP Consult  Pharmacy Consult for heparin Indication: chest pain/ACS  Allergies  Allergen Reactions  . Amiodarone Other (See Comments)    Terrible dreams/nightmares  . Lyrica [Pregabalin] Nausea And Vomiting    Patient Measurements: Height: 5\' 11"  (180.3 cm) Weight: 196 lb 6.4 oz (89.1 kg) IBW/kg (Calculated) : 75.3 Heparin Dosing Weight: 89.1 kg  Vital Signs: Temp: 98.2 F (36.8 C) (11/24 0342) Temp Source: Oral (11/24 0342) BP: 145/71 (11/24 0755) Pulse Rate: 64 (11/24 0755)  Labs: Recent Labs    04/24/17 2227 04/25/17 0420 04/25/17 0626 04/25/17 0854 04/25/17 1408 04/25/17 2325 04/26/17 0529 04/26/17 0726  HGB 13.0 12.9*  --   --   --   --  13.2  --   HCT 38.5* 37.9*  --   --   --   --  38.1*  --   PLT 176 176  --   --   --   --  190  --   APTT  --   --  >160*  --   --   --   --   --   LABPROT  --   --  14.2  --   --   --   --   --   INR  --   --  1.11  --   --   --   --   --   HEPARINUNFRC  --   --   --   --  0.23* 0.42  --  0.48  CREATININE 1.56* 1.29*  1.36*  --   --   --   --  1.29*  --   TROPONINI <0.03 0.46*  --  0.55* 0.42*  --   --   --     Estimated Creatinine Clearance: 44.6 mL/min (A) (by C-G formula based on SCr of 1.29 mg/dL (H)).   Medical History: Past Medical History:  Diagnosis Date  . AAA (abdominal aortic aneurysm) without rupture (HCC) 03/12/2016   Small - measured 3.3 x 3.5 cm by CTA  . CKD (chronic kidney disease) stage 3, GFR 30-59 ml/min (HCC)   . Essential hypertension   . Hyperlipidemia   . Hypertensive kidney disease   . Incidental pulmonary nodule 03/12/2016   Vague opacity right lung base requires radiographic follow up - index of suspicion is LOW  . Near syncope    Cardiogenic, related to an SVT and severe MR  . Non-sustained ventricular tachycardia (HCC)   . S/P minimally invasive mitral valve repair 03/19/2016   Complex valvuloplasty including triangular resection of flail segment  of posterior leaflet, chordal transposition x1, artificial Gore-tex neochord placement x4 and 34 mm Sorin Memo 3D ring annuloplasty via right mini thoracotomy approach with clipping of LA appendage  . Severe mitral regurgitation by prior echocardiogram 02/23/2016   Confirmed by TEE  . Syncope 03/15/2016  . Thoracic aortic aneurysm (HCC) 03/12/2016   Very very mild fusiform enlargement of distal transverse and proximal descending thoracic aorta noted on CTA  . Trigeminal neuralgia     Medications:  Scheduled:  . aspirin EC  81 mg Oral Daily  . pantoprazole  40 mg Oral Daily  . sodium chloride flush  3 mL Intravenous Q12H  . sotalol  80 mg Oral Q12H    Assessment: Patient admitted for pacemaker issue, pt has h/o vtach and also has a defibrillator, patient states he was shocked by pacemaker while watching tv and had a few runs of vtach. Trops  were found to be elevated up to 0.46. Patient is now being started on a heparin drip  11/23 1400 Heparin level resulted at 0.23  Goal of Therapy:  Heparin level 0.3-0.7 units/ml Monitor platelets by anticoagulation protocol: Yes   Plan:  Will order Heparin bolus of 1300 units IV and increase rate to 1200 units/hr. Will check next Heparin level in 8 hours. Daily CBC while on Heparin infusion.  11/23 @ 2330 HL 0.42 therapeutic. Will continue current rate and will recheck anti-Xa @ 0730. Will check CBC w/ am labs.  11/24: Heparin level therapeutic. Will recheck Heparin level in am.  Demetrius Charityeldrin D. Kito Cuffe, PharmD  Clinical Pharmacist 04/26/2017

## 2017-04-26 NOTE — Progress Notes (Signed)
ANTICOAGULATION CONSULT NOTE - Initial Consult  Pharmacy Consult for heparin Indication: chest pain/ACS  Allergies  Allergen Reactions  . Amiodarone Other (See Comments)    Terrible dreams/nightmares  . Lyrica [Pregabalin] Nausea And Vomiting    Patient Measurements: Height: 5\' 11"  (180.3 cm) Weight: 196 lb 6.4 oz (89.1 kg) IBW/kg (Calculated) : 75.3 Heparin Dosing Weight: 89.1 kg  Vital Signs: BP: 126/61 (11/23 1930) Pulse Rate: 71 (11/23 1930)  Labs: Recent Labs    04/24/17 2227 04/25/17 0420 04/25/17 0626 04/25/17 0854 04/25/17 1408 04/25/17 2325  HGB 13.0 12.9*  --   --   --   --   HCT 38.5* 37.9*  --   --   --   --   PLT 176 176  --   --   --   --   APTT  --   --  >160*  --   --   --   LABPROT  --   --  14.2  --   --   --   INR  --   --  1.11  --   --   --   HEPARINUNFRC  --   --   --   --  0.23* 0.42  CREATININE 1.56* 1.29*  1.36*  --   --   --   --   TROPONINI <0.03 0.46*  --  0.55* 0.42*  --     Estimated Creatinine Clearance: 42.3 mL/min (A) (by C-G formula based on SCr of 1.36 mg/dL (H)).   Medical History: Past Medical History:  Diagnosis Date  . AAA (abdominal aortic aneurysm) without rupture (HCC) 03/12/2016   Small - measured 3.3 x 3.5 cm by CTA  . CKD (chronic kidney disease) stage 3, GFR 30-59 ml/min (HCC)   . Essential hypertension   . Hyperlipidemia   . Hypertensive kidney disease   . Incidental pulmonary nodule 03/12/2016   Vague opacity right lung base requires radiographic follow up - index of suspicion is LOW  . Near syncope    Cardiogenic, related to an SVT and severe MR  . Non-sustained ventricular tachycardia (HCC)   . S/P minimally invasive mitral valve repair 03/19/2016   Complex valvuloplasty including triangular resection of flail segment of posterior leaflet, chordal transposition x1, artificial Gore-tex neochord placement x4 and 34 mm Sorin Memo 3D ring annuloplasty via right mini thoracotomy approach with clipping of LA  appendage  . Severe mitral regurgitation by prior echocardiogram 02/23/2016   Confirmed by TEE  . Syncope 03/15/2016  . Thoracic aortic aneurysm (HCC) 03/12/2016   Very very mild fusiform enlargement of distal transverse and proximal descending thoracic aorta noted on CTA  . Trigeminal neuralgia     Medications:  Scheduled:  . aspirin EC  81 mg Oral Daily  . pantoprazole  40 mg Oral Daily  . sodium chloride flush  3 mL Intravenous Q12H  . sotalol  80 mg Oral Q12H    Assessment: Patient admitted for pacemaker issue, pt has h/o vtach and also has a defibrillator, patient states he was shocked by pacemaker while watching tv and had a few runs of vtach. Trops were found to be elevated up to 0.46. Patient is now being started on a heparin drip  11/23 1400 Heparin level resulted at 0.23  Goal of Therapy:  Heparin level 0.3-0.7 units/ml Monitor platelets by anticoagulation protocol: Yes   Plan:  Will order Heparin bolus of 1300 units IV and increase rate to 1200 units/hr. Will check next  Heparin level in 8 hours. Daily CBC while on Heparin infusion.  11/23 @ 2330 HL 0.42 therapeutic. Will continue current rate and will recheck anti-Xa @ 0730. Will check CBC w/ am labs.  Thomasene Ripple, PharmD, BCPS Clinical Pharmacist 04/26/2017

## 2017-04-26 NOTE — Progress Notes (Signed)
[facility] [place] [facility]   PATIENT NAME: Roy Palmer    MR#:  [id number]  DATE OF BIRTH:  08/12/1929  SUBJECTIVE:  CHIEF COMPLAINT: Patient is resting comfortably.  Denies any palpitations.  Denies any chest pain or shortness of breath.  No new complaints  REVIEW OF SYSTEMS:  CONSTITUTIONAL: No fever, fatigue or weakness.  EYES: No blurred or double vision.  EARS, NOSE, AND THROAT: No tinnitus or ear pain.  RESPIRATORY: No cough, shortness of breath, wheezing or hemoptysis.  CARDIOVASCULAR: No chest pain, orthopnea, edema.  GASTROINTESTINAL: No nausea, vomiting, diarrhea or abdominal pain.  GENITOURINARY: No dysuria, hematuria.  ENDOCRINE: No polyuria, nocturia,  HEMATOLOGY: No anemia, easy bruising or bleeding SKIN: No rash or lesion. MUSCULOSKELETAL: No joint pain or arthritis.   NEUROLOGIC: No tingling, numbness, weakness.  PSYCHIATRY: No anxiety or depression.   DRUG ALLERGIES:   Allergies  Allergen Reactions  . Amiodarone Other (See Comments)    Terrible dreams/nightmares  . Lyrica [Pregabalin] Nausea And Vomiting    VITALS:  Blood pressure (!) 145/71, pulse 64, temperature 98.2 F (36.8 C), temperature source Oral, resp. rate 18, height 5\' 11"  (1.803 m), weight 89.1 kg (196 lb 6.4 oz), SpO2 96 %.  PHYSICAL EXAMINATION:  GENERAL:  83 y.o.-year-old patient lying in the bed with no acute distress.  EYES: Pupils equal, round, reactive to light and accommodation. No scleral icterus. Extraocular muscles intact.  HEENT: Head atraumatic, normocephalic. Oropharynx and nasopharynx clear.  NECK:  Supple, no jugular venous distention. No thyroid enlargement, no tenderness.  LUNGS: Normal breath sounds bilaterally, no wheezing, rales,rhonchi or crepitation. No use of accessory muscles of respiration.  CARDIOVASCULAR: S1, S2 normal. No murmurs, rubs, or gallops.  ABDOMEN: Soft, nontender, nondistended. Bowel sounds present. No organomegaly or  mass.  EXTREMITIES: No pedal edema, cyanosis, or clubbing.  NEUROLOGIC: Cranial nerves II through XII are intact. Muscle strength 5/5 in all extremities. Sensation intact. Gait not checked.  PSYCHIATRIC: The patient is alert and oriented x 3.  SKIN: No obvious rash, lesion, or ulcer.    LABORATORY PANEL:   CBC Recent Labs  Lab 04/26/17 0529  WBC 7.7  HGB 13.2  HCT 38.1*  PLT 190   ------------------------------------------------------------------------------------------------------------------  Chemistries  Recent Labs  Lab 04/24/17 2227  04/26/17 0529  NA 136   < > 137  K 4.0   < > 4.5  CL 105   < > 107  CO2 22   < > 23  GLUCOSE 150*   < > 101*  BUN 24*   < > 18  CREATININE 1.56*   < > 1.29*  CALCIUM 8.1*   < > 8.3*  MG 2.2  --   --    < > = values in this interval not displayed.   ------------------------------------------------------------------------------------------------------------------  Cardiac Enzymes Recent Labs  Lab 04/25/17 1408  TROPONINI 0.42*   ------------------------------------------------------------------------------------------------------------------  RADIOLOGY:  Dg Chest 2 View  Result Date: 04/24/2017 CLINICAL DATA:  Chest pain after defibrillator deployed. EXAM: CHEST  2 VIEW COMPARISON:  None. FINDINGS: The heart size and mediastinal contours are within normal limits. Mild aortic atherosclerosis without aneurysm. Right atrial pacer and right ventricular defibrillator leads are noted with left-sided ICD device in place. Left atrial appendage clipping is noted. Mitral valvular repair is faintly visualized. Both lungs are clear. The visualized skeletal structures are unremarkable. IMPRESSION: No active cardiopulmonary disease. Electronically Signed   By: Tollie Ethavid  Kwon M.D.   On: 04/24/2017 22:59  EKG:   Orders placed or performed during the hospital encounter of 04/24/17  . EKG 12-Lead  . EKG 12-Lead  . ED EKG within 10 minutes  . ED  EKG within 10 minutes  . EKG 12-Lead    ASSESSMENT AND PLAN:    83 year old elderly male patient with history of hypertension, hyperlipidemia, chronic kidney disease stage III, abdominal aortic aneurysm presented to the emergency room for dizziness after his defibrillator shocked him.   1. Ventricular arrhythmia Patient is admitted for sustained VT status post ICD shock on 04/24/2017  echocardiogram results pending Cardiology is recommending EP eval which is pending at this time Continue aspirin, Coreg.  Continue Cozaar heparin drip  Sotalol dosing to 120 mg p.o. twice daily  2. Hypertension Continue Coreg, Cozaar  3. Hyperlipidemia Check lipid panel  4. Chronic kidney disease stage III Monitor renal function, avoid nephrotoxins  DVT prophylaxis subcutaneous Lovenox 40 MG daily   Discussed with cardiac Dr. Tenny Craw   All the records are reviewed and case discussed with Care Management/Social Workerr. Management plans discussed with the patient, family and they are in agreement.  CODE STATUS: fc   TOTAL TIME TAKING CARE OF THIS PATIENT: 35  minutes.   POSSIBLE D/C IN 1-2 DAYS, DEPENDING ON CLINICAL CONDITION.  Note: This dictation was prepared with Dragon dictation along with smaller phrase technology. Any transcriptional errors that result from this process are unintentional.   Ramonita Lab M.D on 04/26/2017 at 1:40 PM  Between 7am to 6pm - Pager - [phone] After 6pm go to [email or web address] - password [email or web address] [facility]  [facility]  Office  [phone]  CC: Primary care physician; Gabriel Cirri, NP

## 2017-04-27 DIAGNOSIS — I472 Ventricular tachycardia: Secondary | ICD-10-CM

## 2017-04-27 LAB — CBC
HCT: 39.9 % — ABNORMAL LOW (ref 40.0–52.0)
Hemoglobin: 13.6 g/dL (ref 13.0–18.0)
MCH: 32.1 pg (ref 26.0–34.0)
MCHC: 34 g/dL (ref 32.0–36.0)
MCV: 94.4 fL (ref 80.0–100.0)
Platelets: 211 10*3/uL (ref 150–440)
RBC: 4.22 MIL/uL — ABNORMAL LOW (ref 4.40–5.90)
RDW: 14.1 % (ref 11.5–14.5)
WBC: 8.6 10*3/uL (ref 3.8–10.6)

## 2017-04-27 LAB — HEPARIN LEVEL (UNFRACTIONATED): Heparin Unfractionated: 0.49 IU/mL (ref 0.30–0.70)

## 2017-04-27 LAB — ECHOCARDIOGRAM COMPLETE
Height: 71 in
Weight: 3142.4 oz

## 2017-04-27 MED ORDER — SOTALOL HCL 120 MG PO TABS: 120.0000 mg | ORAL_TABLET | Freq: Two times a day (BID) | ORAL | 0 refills | Status: DC

## 2017-04-27 NOTE — Discharge Summary (Addendum)
[facility] [place] [facility]   PATIENT NAME: Roy BellingJohn Palmer    MR#:  [id number]  DATE OF BIRTH:  08/02/1929  DATE OF ADMISSION:  04/24/2017 ADMITTING PHYSICIAN: Ihor AustinPavan Pyreddy, MD  DATE OF DISCHARGE: 04/27/17  PRIMARY CARE PHYSICIAN: Gabriel CirriWicker, Cheryl, NP    ADMISSION DIAGNOSIS:  Ventricular tachycardia (HCC) [I47.2] Defibrillator discharge [Z45.02]  DISCHARGE DIAGNOSIS:  Active Problems:   Ventricular arrhythmia   V-tach [facility](HCC)   SECONDARY DIAGNOSIS:   Past Medical History:  Diagnosis Date  . AAA (abdominal aortic aneurysm) without rupture (HCC) 03/12/2016   Small - measured 3.3 x 3.5 cm by CTA  . CKD (chronic kidney disease) stage 3, GFR 30-59 ml/min (HCC)   . Essential hypertension   . Hyperlipidemia   . Hypertensive kidney disease   . Incidental pulmonary nodule 03/12/2016   Vague opacity right lung base requires radiographic follow up - index of suspicion is LOW  . Near syncope    Cardiogenic, related to an SVT and severe MR  . Non-sustained ventricular tachycardia (HCC)   . S/P minimally invasive mitral valve repair 03/19/2016   Complex valvuloplasty including triangular resection of flail segment of posterior leaflet, chordal transposition x1, artificial Gore-tex neochord placement x4 and 34 mm Sorin Memo 3D ring annuloplasty via right mini thoracotomy approach with clipping of LA appendage  . Severe mitral regurgitation by prior echocardiogram 02/23/2016   Confirmed by TEE  . Syncope 03/15/2016  . Thoracic aortic aneurysm (HCC) 03/12/2016   Very very mild fusiform enlargement of distal transverse and proximal descending thoracic aorta noted on CTA  . Trigeminal neuralgia     HOSPITAL COURSE:  HISTORY OF PRESENT ILLNESS: Roy Palmer  is a 83 y.o. male with a known history of abdominal aortic aneurysm, chronic kidney disease stage III, hypertension, hyperlipidemia, nonsustained ventricular tachycardia, complex valvuloplasty presented  to the emergency room after his defibrillator shocked him. Patient felt dizzy while watching television and felt as if he was passing out. Patient says his defibrillator shocked him. Patient has a pacemaker and defibrillator. Pacemaker was interrogated in the emergency room and he had an event of ventricular tachycardia and there was any intervention released and shock released. The shock was able to terminate the rhythm. Case was discussed by ER physician with cardiology on-call who recommended the patient for observation. No complaints of any chest pain. Patient has no palpitations and feels better now.    1.Ventricular arrhythmia Patient is admitted for sustained VT status post ICD shock on 04/24/2017  echocardiogram  25 to 30% ejection fraction , moderate LVH, moderate mitral regurgitation Cardiology is recommending EP eval outpatient with Dr. Ladona Ridgelaylor ASAP for revising the device  continue aspirin, d/cCoreg.  Continue Cozaar ; discontinued heparin drip  Sotalol dose increased to 120 mg p.o. twice daily, no other episodes of arrythemia  noticed  2.Hypertension Continue Coreg, Cozaar  3.Hyperlipidemia Check lipid panel  4.Chronic kidney disease stage III Monitor renal function, avoid nephrotoxins Creatinine at 1.29, PCP to monitor  DVT prophylaxis on heparin drip during the hospital course    DISCHARGE CONDITIONS:   stable  CONSULTS OBTAINED:     PROCEDURES  None   DRUG ALLERGIES:   Allergies  Allergen Reactions  . Amiodarone Other (See Comments)    Terrible dreams/nightmares  . Lyrica [Pregabalin] Nausea And Vomiting    DISCHARGE MEDICATIONS:   Current Discharge Medication List    START taking these medications   Details  sotalol (BETAPACE) 120 MG tablet Take 1  tablet (120 mg total) by mouth every 12 (twelve) hours. Qty: 60 tablet, Refills: 0      CONTINUE these medications which have NOT CHANGED   Details  aspirin EC 81 MG tablet Take 1 tablet (81 mg  total) by mouth daily.    losartan (COZAAR) 25 MG tablet Take 0.5 tablets (12.5 mg total) by mouth daily. Qty: 30 tablet, Refills: 5    omeprazole (PRILOSEC) 20 MG capsule Take 20 mg daily by mouth.      STOP taking these medications     carvedilol (COREG) 6.25 MG tablet          DISCHARGE INSTRUCTIONS:   Follow-up with primary physician in 1 week Follow-up with cardiology Dr. Ladona Ridgel as soon as possible in 2-3 days or sooner as needed  DIET:  Cardiac diet  DISCHARGE CONDITION:  Stable  ACTIVITY:  Activity as tolerated  OXYGEN:  Home Oxygen: No.   Oxygen Delivery: room air  DISCHARGE LOCATION:  home   If you experience worsening of your admission symptoms, develop shortness of breath, life threatening emergency, suicidal or homicidal thoughts you must seek medical attention immediately by calling 911 or calling your MD immediately  if symptoms less severe.  You Must read complete instructions/literature along with all the possible adverse reactions/side effects for all the Medicines you take and that have been prescribed to you. Take any new Medicines after you have completely understood and accpet all the possible adverse reactions/side effects.   Please note  You were cared for by a hospitalist during your hospital stay. If you have any questions about your discharge medications or the care you received while you were in the hospital after you are discharged, you can call the unit and asked to speak with the hospitalist on call if the hospitalist that took care of you is not available. Once you are discharged, your primary care physician will handle any further medical issues. Please note that NO REFILLS for any discharge medications will be authorized once you are discharged, as it is imperative that you return to your primary care physician (or establish a relationship with a primary care physician if you do not have one) for your aftercare needs so that they can  reassess your need for medications and monitor your lab values.     Today  Chief Complaint  Patient presents with  . Pacemaker Problem   Patient denies any dizzy spells or palpitations.  Wants to go home.  Daughter at bedside.  Okay to discharge patient from cardiology standpoint.  ROS:  CONSTITUTIONAL: Denies fevers, chills. Denies any fatigue, weakness.  EYES: Denies blurry vision, double vision, eye pain. EARS, NOSE, THROAT: Denies tinnitus, ear pain, hearing loss. RESPIRATORY: Denies cough, wheeze, shortness of breath.  CARDIOVASCULAR: Denies chest pain, palpitations, edema.  GASTROINTESTINAL: Denies nausea, vomiting, diarrhea, abdominal pain. Denies bright red blood per rectum. GENITOURINARY: Denies dysuria, hematuria. ENDOCRINE: Denies nocturia or thyroid problems. HEMATOLOGIC AND LYMPHATIC: Denies easy bruising or bleeding. SKIN: Denies rash or lesion. MUSCULOSKELETAL: Denies pain in neck, back, shoulder, knees, hips or arthritic symptoms.  NEUROLOGIC: Denies paralysis, paresthesias.  PSYCHIATRIC: Denies anxiety or depressive symptoms.   VITAL SIGNS:  Blood pressure 124/66, pulse (!) 59, temperature 98.4 F (36.9 C), temperature source Oral, resp. rate 18, height 5\' 11"  (1.803 m), weight 89.1 kg (196 lb 6.4 oz), SpO2 95 %.  I/O:    Intake/Output Summary (Last 24 hours) at 04/27/2017 1228 Last data filed at 04/27/2017 1026 Gross per 24 hour  Intake 1126.6 ml  Output 1275 ml  Net -148.4 ml    PHYSICAL EXAMINATION:  GENERAL:  83 y.o.-year-old patient lying in the bed with no acute distress.  EYES: Pupils equal, round, reactive to light and accommodation. No scleral icterus. Extraocular muscles intact.  HEENT: Head atraumatic, normocephalic. Oropharynx and nasopharynx clear.  NECK:  Supple, no jugular venous distention. No thyroid enlargement, no tenderness.  LUNGS: Normal breath sounds bilaterally, no wheezing, rales,rhonchi or crepitation. No use of accessory  muscles of respiration.  CARDIOVASCULAR: S1, S2 normal. No murmurs, rubs, or gallops.  ABDOMEN: Soft, non-tender, non-distended. Bowel sounds present. No organomegaly or mass.  EXTREMITIES: No pedal edema, cyanosis, or clubbing.  NEUROLOGIC: Cranial nerves II through XII are intact. Muscle strength 5/5 in all extremities. Sensation intact. Gait not checked.  PSYCHIATRIC: The patient is alert and oriented x 3.  SKIN: No obvious rash, lesion, or ulcer.   DATA REVIEW:   CBC Recent Labs  Lab 04/27/17 0437  WBC 8.6  HGB 13.6  HCT 39.9*  PLT 211    Chemistries  Recent Labs  Lab 04/24/17 2227  04/26/17 0529  NA 136   < > 137  K 4.0   < > 4.5  CL 105   < > 107  CO2 22   < > 23  GLUCOSE 150*   < > 101*  BUN 24*   < > 18  CREATININE 1.56*   < > 1.29*  CALCIUM 8.1*   < > 8.3*  MG 2.2  --   --    < > = values in this interval not displayed.    Cardiac Enzymes Recent Labs  Lab 04/25/17 1408  TROPONINI 0.42*    Microbiology Results  Results for orders placed or performed in visit on 04/22/17  Body Fluid Culture     Status: None   Collection Time: 04/22/17  3:27 PM  Result Value Ref Range Status   Body Fluid Culture, Sterile Final report  Final   Organism ID, Bacteria Comment  Final    Comment: No growth in 56 - 72 hours.    RADIOLOGY:  Dg Chest 2 View  Result Date: 04/24/2017 CLINICAL DATA:  Chest pain after defibrillator deployed. EXAM: CHEST  2 VIEW COMPARISON:  None. FINDINGS: The heart size and mediastinal contours are within normal limits. Mild aortic atherosclerosis without aneurysm. Right atrial pacer and right ventricular defibrillator leads are noted with left-sided ICD device in place. Left atrial appendage clipping is noted. Mitral valvular repair is faintly visualized. Both lungs are clear. The visualized skeletal structures are unremarkable. IMPRESSION: No active cardiopulmonary disease. Electronically Signed   By: Tollie Eth M.D.   On: 04/24/2017 22:59     EKG:   Orders placed or performed during the hospital encounter of 04/24/17  . EKG 12-Lead  . EKG 12-Lead  . ED EKG within 10 minutes  . ED EKG within 10 minutes  . EKG 12-Lead  . EKG 12-Lead      Management plans discussed with the patient, family and they are in agreement.  CODE STATUS:     Code Status Orders  (From admission, onward)        Start     Ordered   04/25/17 0315  Full code  Continuous     04/25/17 0314    Code Status History    Date Active Date Inactive Code Status Order ID Comments User Context   03/12/2016 21:01 03/29/2016 16:46 Full Code [id number]  Janee Morn,  Wille Celeste, PA-C Inpatient    Advance Directive Documentation     Most Recent Value  Type of Advance Directive  Living will  Pre-existing out of facility DNR order (yellow form or pink MOST form)  No data  "MOST" Form in Place?  No data      TOTAL TIME TAKING CARE OF THIS PATIENT: 45  minutes.   Note: This dictation was prepared with Dragon dictation along with smaller phrase technology. Any transcriptional errors that result from this process are unintentional.   @MEC @  on 04/27/2017 at 12:28 PM  Between 7am to 6pm - Pager - [phone]  After 6pm go to [email or web address] - password [email or web address] [facility]  [facility]  Office  [phone]  CC: Primary care physician; Gabriel Cirri, NP

## 2017-04-27 NOTE — Discharge Instructions (Signed)
Follow-up with primary physician in 1 week Follow-up with cardiology Dr. Ladona Ridgel as soon as possible in 2-3 days or sooner as needed

## 2017-04-27 NOTE — Progress Notes (Signed)
ANTICOAGULATION CONSULT NOTE - FOLLOW UP Consult  Pharmacy Consult for heparin Indication: chest pain/ACS  Allergies  Allergen Reactions  . Amiodarone Other (See Comments)    Terrible dreams/nightmares  . Lyrica [Pregabalin] Nausea And Vomiting    Patient Measurements: Height: 5\' 11"  (180.3 cm) Weight: 196 lb 6.4 oz (89.1 kg) IBW/kg (Calculated) : 75.3 Heparin Dosing Weight: 89.1 kg  Vital Signs: Temp: 98.4 F (36.9 C) (11/25 0420) Temp Source: Oral (11/25 0420) BP: 120/73 (11/25 0420) Pulse Rate: 60 (11/25 0420)  Labs: Recent Labs    04/24/17 2227 04/25/17 0420 04/25/17 0626 04/25/17 0854  04/25/17 1408 04/25/17 2325 04/26/17 0529 04/26/17 0726 04/27/17 0437  HGB 13.0 12.9*  --   --   --   --   --  13.2  --  13.6  HCT 38.5* 37.9*  --   --   --   --   --  38.1*  --  39.9*  PLT 176 176  --   --   --   --   --  190  --  211  APTT  --   --  >160*  --   --   --   --   --   --   --   LABPROT  --   --  14.2  --   --   --   --   --   --   --   INR  --   --  1.11  --   --   --   --   --   --   --   HEPARINUNFRC  --   --   --   --    < > 0.23* 0.42  --  0.48 0.49  CREATININE 1.56* 1.29*  1.36*  --   --   --   --   --  1.29*  --   --   TROPONINI <0.03 0.46*  --  0.55*  --  0.42*  --   --   --   --    < > = values in this interval not displayed.    Estimated Creatinine Clearance: 44.6 mL/min (A) (by C-G formula based on SCr of 1.29 mg/dL (H)).   Medical History: Past Medical History:  Diagnosis Date  . AAA (abdominal aortic aneurysm) without rupture (HCC) 03/12/2016   Small - measured 3.3 x 3.5 cm by CTA  . CKD (chronic kidney disease) stage 3, GFR 30-59 ml/min (HCC)   . Essential hypertension   . Hyperlipidemia   . Hypertensive kidney disease   . Incidental pulmonary nodule 03/12/2016   Vague opacity right lung base requires radiographic follow up - index of suspicion is LOW  . Near syncope    Cardiogenic, related to an SVT and severe MR  . Non-sustained  ventricular tachycardia (HCC)   . S/P minimally invasive mitral valve repair 03/19/2016   Complex valvuloplasty including triangular resection of flail segment of posterior leaflet, chordal transposition x1, artificial Gore-tex neochord placement x4 and 34 mm Sorin Memo 3D ring annuloplasty via right mini thoracotomy approach with clipping of LA appendage  . Severe mitral regurgitation by prior echocardiogram 02/23/2016   Confirmed by TEE  . Syncope 03/15/2016  . Thoracic aortic aneurysm (HCC) 03/12/2016   Very very mild fusiform enlargement of distal transverse and proximal descending thoracic aorta noted on CTA  . Trigeminal neuralgia     Medications:  Scheduled:  . aspirin EC  81 mg Oral Daily  .  pantoprazole  40 mg Oral Daily  . sodium chloride flush  3 mL Intravenous Q12H  . sotalol  120 mg Oral Q12H    Assessment: Patient admitted for pacemaker issue, pt has h/o vtach and also has a defibrillator, patient states he was shocked by pacemaker while watching tv and had a few runs of vtach. Trops were found to be elevated up to 0.46. Patient is now being started on a heparin drip  11/23 1400 Heparin level resulted at 0.23  Goal of Therapy:  Heparin level 0.3-0.7 units/ml Monitor platelets by anticoagulation protocol: Yes   Plan:  Will order Heparin bolus of 1300 units IV and increase rate to 1200 units/hr. Will check next Heparin level in 8 hours. Daily CBC while on Heparin infusion.  11/23 @ 2330 HL 0.42 therapeutic. Will continue current rate and will recheck anti-Xa @ 0730. Will check CBC w/ am labs.  11/24: Heparin level therapeutic. Will recheck Heparin level in am.  11/25 @ 0430 HL 0.49 therapeutic. Will continue current rate and will recheck next anti-Xa w/ am labs.  Thomasene Rippleavid Santhiago Collingsworth, PharmD, BCPS Clinical Pharmacist 04/27/2017

## 2017-04-27 NOTE — Progress Notes (Signed)
Progress Note  Patient Name: Roy Palmer Date of Encounter: 04/27/2017  Primary Cardiologist: Ladona Ridgel, End    Subjective   No Dizziness  No CP  No SOB    Inpatient Medications    Scheduled Meds: . aspirin EC  81 mg Oral Daily  . pantoprazole  40 mg Oral Daily  . sodium chloride flush  3 mL Intravenous Q12H  . sotalol  120 mg Oral Q12H   Continuous Infusions: . sodium chloride    . heparin 1,200 Units/hr (04/26/17 1923)   PRN Meds: sodium chloride, acetaminophen **OR** acetaminophen, ondansetron **OR** ondansetron (ZOFRAN) IV, senna-docusate, sodium chloride flush   Vital Signs    Vitals:   04/26/17 1724 04/26/17 1932 04/27/17 0420 04/27/17 0800  BP: 128/68 116/73 120/73 124/66  Pulse: 63 67 60 (!) 59  Resp:      Temp: 98.3 F (36.8 C) (!) 97.5 F (36.4 C) 98.4 F (36.9 C)   TempSrc: Oral Oral Oral   SpO2: 97% 97% 94% 95%  Weight:      Height:        Intake/Output Summary (Last 24 hours) at 04/27/2017 1019 Last data filed at 04/27/2017 0620 Gross per 24 hour  Intake 886.6 ml  Output 1275 ml  Net -388.4 ml   Filed Weights   04/24/17 2212 04/25/17 0323  Weight: 198 lb (89.8 kg) 196 lb 6.4 oz (89.1 kg)    Telemetry    Paced  - Personally Reviewed  No VT  ECG      Physical Exam   GEN: No acute distress.   Neck: JVP normal   Cardiac: RRR, no murmurs, rubs, or gallops.  Respiratory: Clear to auscultation bilaterally. GI: Soft, nontender, non-distended  MS: No edema; No deformity. Neuro:  Nonfocal  Psych: Normal affect   Labs    Chemistry Recent Labs  Lab 04/24/17 2227 04/25/17 0420 04/26/17 0529  NA 136 138 137  K 4.0 4.3 4.5  CL 105 107 107  CO2 22 23 23   GLUCOSE 150* 101* 101*  BUN 24* 20 18  CREATININE 1.56* 1.29*  1.36* 1.29*  CALCIUM 8.1* 8.5* 8.3*  GFRNONAA 39* 49*  46* 49*  GFRAA 45* 57*  53* 57*  ANIONGAP 9 8 7      Hematology Recent Labs  Lab 04/25/17 0420 04/26/17 0529 04/27/17 0437  WBC 7.6 7.7 8.6  RBC  4.01* 4.01* 4.22*  HGB 12.9* 13.2 13.6  HCT 37.9* 38.1* 39.9*  MCV 94.5 94.9 94.4  MCH 32.3 32.9 32.1  MCHC 34.2 34.7 34.0  RDW 13.9 13.6 14.1  PLT 176 190 211    Cardiac Enzymes Recent Labs  Lab 04/24/17 2227 04/25/17 0420 04/25/17 0854 04/25/17 1408  TROPONINI <0.03 0.46* 0.55* 0.42*   No results for input(s): TROPIPOC in the last 168 hours.   BNPNo results for input(s): BNP, PROBNP in the last 168 hours.   DDimer No results for input(s): DDIMER in the last 168 hours.   Radiology    No results found.  Cardiac Studies     Patient Profile         Roy Palmer is a 83 y.o. male with a hx of severe mitral regurgitation status post minimally invasive mitral valve repair and 03/19/2016 with left atrial appendage clipping, syncope with sustained ventricular tachycardia status post Medtronic dual chamber ICD on 03/27/2016, nonobstructive CAD by [facility] 02/2016, nonischemic cardiomyopathy with most recent EF of 25-30% by echo in 02/2017, history of frequent PVCs/prior NSVT intolerant  to amiodarone secondary to hallucinations, prior baseline sinus bradycardia in the 50s, hypertension, hyperlipidemia, orthostatic hypotension, and CKD stage II-III who is being seen today for the evaluation of ICD shock at the request of Dr. Tobi BastosPyreddy.     Assessment & Plan    1  VT  Intolerant to mexilitene and amiodarone   Now on sotalol 120 bid   EKG with AV pacing   Discussed with Rosette RevealG Taylor  He wlll arrange for pt to be seen back in EP clinc for possible reprogramming of pacing intervals  2  Chronic systolic CHF   LVEF 25 to 30%  Nonischemic  VOlume OK  WIll need to be followed in cardiology clinic for medication adjustment  On sotalol only now   3  HTN  BP OK    4MR   S/p repair  5  HL     6 Hx orthostatic hypotension  Denies dizziness  OK to d/c  F/u as noted    For questions or updates, please contact [facility] Please consult [email or web address] for contact info under  Cardiology/STEMI.      Signed, Dietrich PatesPaula Arieanna Pressey, MD  04/27/2017, 10:19 AM

## 2017-04-27 NOTE — Progress Notes (Signed)
IV and tele removed from patient. Discharge instructions given to patient along with hard copy prescription. No distress at this time. Daughter at bedside and will transport patient home.

## 2017-04-29 ENCOUNTER — Telehealth: Payer: Self-pay | Admitting: Unknown Physician Specialty

## 2017-04-29 NOTE — Telephone Encounter (Signed)
-----   Message from Pablo Ledger, CMA sent at 04/28/2017  4:40 PM EST ----- Patient recently discharged from [facility]. Needs hospital follow up scheduled please.  Thanks.

## 2017-04-29 NOTE — Telephone Encounter (Signed)
Routing to provider, just an FYI

## 2017-04-29 NOTE — Telephone Encounter (Signed)
Spoke with pt and he has refused to come in for an appt. After asking again the pt explained that he would call us when he hears from cardiology. He asked that we not call him back as he would call us after he spoke with cardiology.

## 2017-04-30 ENCOUNTER — Telehealth: Payer: Self-pay | Admitting: Cardiology

## 2017-04-30 NOTE — Telephone Encounter (Signed)
Spoke w/ pt and he informed me that he believes that he received therapies. Patient stated that he feels weak, and sort of dizzy, no shortness of breath, and no chest pain. Instructed pt to send a manual transmission w/ his home monitor. Once the transmission is received a Armed forces logistics/support/administrative officer will review and call him back. Pt verbalized understanding.

## 2017-04-30 NOTE — Telephone Encounter (Signed)
Late Entry: Spoke with pt, pt was calling regarding shock from 04/24/17, transmission received pt had VT episode that was successfully terminated after two bursts of ATP. Pt stated that he felt like he was going to faint but did not pass out. Pt reported compliance with  Sotalol 120mg  twice daily. Pt to f/u with GT on 11/30.

## 2017-04-30 NOTE — Telephone Encounter (Signed)
Message received from Dr. Ladona Ridgel requesting Pt be seen asap for recurrent VT.  Scheduled Pt for this Friday at 11 am with Dr. Ladona Ridgel.  Call made to Pt.  Notified Pt of appt time.  Per Pt that time is good.  Pt states he will go to [facility] after his appt.  Message sent to device and scheduling to notify of appt.  No further action needed at this time.

## 2017-05-02 ENCOUNTER — Encounter: Payer: Self-pay | Admitting: Internal Medicine

## 2017-05-02 ENCOUNTER — Ambulatory Visit (INDEPENDENT_AMBULATORY_CARE_PROVIDER_SITE_OTHER): Payer: Medicare Other | Admitting: Internal Medicine

## 2017-05-02 VITALS — BP 110/80 | HR 67 | Ht 71.0 in | Wt 195.0 lb

## 2017-05-02 DIAGNOSIS — I472 Ventricular tachycardia, unspecified: Secondary | ICD-10-CM

## 2017-05-02 DIAGNOSIS — I5022 Chronic systolic (congestive) heart failure: Secondary | ICD-10-CM

## 2017-05-02 DIAGNOSIS — Z95810 Presence of automatic (implantable) cardiac defibrillator: Secondary | ICD-10-CM | POA: Diagnosis not present

## 2017-05-02 DIAGNOSIS — I38 Endocarditis, valve unspecified: Secondary | ICD-10-CM

## 2017-05-02 LAB — CUP PACEART INCLINIC DEVICE CHECK
Battery Remaining Longevity: 120 mo
Battery Voltage: 3.01 V
Brady Statistic AP VP Percent: 36.79 %
Brady Statistic AP VS Percent: 22.43 %
Brady Statistic AS VP Percent: 30.34 %
Brady Statistic AS VS Percent: 10.45 %
Brady Statistic RA Percent Paced: 58.17 %
Brady Statistic RV Percent Paced: 65.79 %
Date Time Interrogation Session: 20181130123121
HighPow Impedance: 65 Ohm
Implantable Lead Implant Date: 20171025
Implantable Lead Implant Date: 20171025
Implantable Lead Location: 753859
Implantable Lead Location: 753860
Implantable Lead Model: 5076
Implantable Pulse Generator Implant Date: 20171025
Lead Channel Impedance Value: 361 Ohm
Lead Channel Impedance Value: 456 Ohm
Lead Channel Impedance Value: 475 Ohm
Lead Channel Pacing Threshold Amplitude: 0.625 V
Lead Channel Pacing Threshold Amplitude: 0.625 V
Lead Channel Pacing Threshold Pulse Width: 0.4 ms
Lead Channel Pacing Threshold Pulse Width: 0.4 ms
Lead Channel Sensing Intrinsic Amplitude: 3.125 mV
Lead Channel Sensing Intrinsic Amplitude: 3.5 mV
Lead Channel Sensing Intrinsic Amplitude: 4.875 mV
Lead Channel Sensing Intrinsic Amplitude: 5.5 mV
Lead Channel Setting Pacing Amplitude: 2 V
Lead Channel Setting Pacing Amplitude: 2.5 V
Lead Channel Setting Pacing Pulse Width: 0.4 ms
Lead Channel Setting Sensing Sensitivity: 0.3 mV

## 2017-05-02 MED ORDER — SOTALOL HCL 120 MG PO TABS: 120.0000 mg | ORAL_TABLET | Freq: Every day | ORAL | 3 refills | Status: DC

## 2017-05-02 NOTE — Patient Instructions (Addendum)
Medication Instructions:  Your physician has recommended you make the following change in your medication:  1.  Reduce your sotalol to 120 mg one tablet by mouth daily.    Labwork: None ordered.  Testing/Procedures:  No driving for 6 months.  Follow-Up: Your physician wants you to follow-up in: 3 months with Dr. Ladona Ridgel.  Remote monitoring is used to monitor your ICD from home. This monitoring reduces the number of office visits required to check your device to one time per year. It allows [place] to keep an eye on the functioning of your device to ensure it is working properly. You are scheduled for a device check from home on 06/23/2017. You may send your transmission at any time that day. If you have a wireless device, the transmission will be sent automatically. After your physician reviews your transmission, you will receive a postcard with your next transmission date.    Any Other Special Instructions Will Be Listed Below (If Applicable).     If you need a refill on your cardiac medications before your next appointment, please call your pharmacy.

## 2017-05-02 NOTE — Progress Notes (Signed)
HPI [name] returns today for ongoing evaluation and management of ventricular tachycardia status post ICD implantation with recent ICD therapies for rapid VT. The patient was placed on sotalol therapy last week after the above presentation. In the interim, he has had no recurrent VT but does note fatigue and weakness since starting the new medication. He has not had syncope. He denies chest pain. Allergies  Allergen Reactions  . Amiodarone Other (See Comments)    Terrible dreams/nightmares  . Lyrica [Pregabalin] Nausea And Vomiting     Current Outpatient Medications  Medication Sig Dispense Refill  . aspirin EC 81 MG tablet Take 1 tablet (81 mg total) by mouth daily.    Marland Kitchen. losartan (COZAAR) 25 MG tablet Take 0.5 tablets (12.5 mg total) by mouth daily. 30 tablet 5  . omeprazole (PRILOSEC) 20 MG capsule Take 20 mg daily by mouth.    . sotalol (BETAPACE) 120 MG tablet Take 1 tablet (120 mg total) by mouth daily. 90 tablet 3   No current facility-administered medications for this visit.      Past Medical History:  Diagnosis Date  . AAA (abdominal aortic aneurysm) without rupture (HCC) 03/12/2016   Small - measured 3.3 x 3.5 cm by CTA  . CKD (chronic kidney disease) stage 3, GFR 30-59 ml/min (HCC)   . Essential hypertension   . Hyperlipidemia   . Hypertensive kidney disease   . Incidental pulmonary nodule 03/12/2016   Vague opacity right lung base requires radiographic follow up - index of suspicion is LOW  . Near syncope    Cardiogenic, related to an SVT and severe MR  . Non-sustained ventricular tachycardia (HCC)   . S/P minimally invasive mitral valve repair 03/19/2016   Complex valvuloplasty including triangular resection of flail segment of posterior leaflet, chordal transposition x1, artificial Gore-tex neochord placement x4 and 34 mm Sorin Memo 3D ring annuloplasty via right mini thoracotomy approach with clipping of LA appendage  . Severe mitral regurgitation by  prior echocardiogram 02/23/2016   Confirmed by TEE  . Syncope 03/15/2016  . Thoracic aortic aneurysm (HCC) 03/12/2016   Very very mild fusiform enlargement of distal transverse and proximal descending thoracic aorta noted on CTA  . Trigeminal neuralgia     ROS:   All systems reviewed and negative except as noted in the HPI.   Past Surgical History:  Procedure Laterality Date  . CARDIAC CATHETERIZATION N/A 02/23/2016   Procedure: Right/Left Heart Cath and Coronary Angiography;  Surgeon: Yvonne Kendallhristopher End, MD;  [facility];  Service: Cardiovascular: Angiographic minimal coronary disease. Normal left and right heart Pressures. Decreased CO/CI in settng of frequent PVCs & Severe MR.  Marland Kitchen. CATARACT EXTRACTION, BILATERAL    . COLONOSCOPY  08/2005  . CYST EXCISION  10/2013   from neck  . EP IMPLANTABLE DEVICE N/A 03/27/2016   Procedure: ICD Implant Dual Chamber;  Surgeon: Marinus MawGregg W Taylor, MD;  [facility];  Service: Cardiovascular;  Laterality: N/A;  . ESOPHAGOGASTRODUODENOSCOPY  02/2010  . MITRAL VALVE REPAIR Right 03/19/2016   Procedure: MINIMALLY INVASIVE MITRAL VALVE REPAIR (MVR);  Surgeon: Purcell Nailslarence H Owen, MD;  [facility];  Service: Open Heart Surgery;  Laterality: Right;  . TEE WITHOUT CARDIOVERSION N/A 02/23/2016   Procedure: TRANSESOPHAGEAL ECHOCARDIOGRAM (TEE);  Surgeon: Quintella Reichertraci R Turner, MD;  [facility];  Service: Cardiovascular: Normal LV size and function.  Degenerative mitral valve disease with failed posterior leaflet (P2 segment), Severe MR with pulmonary  vein systolic flow reversal. Moderate-TR.    . TEE WITHOUT CARDIOVERSION N/A 03/19/2016   Procedure: TRANSESOPHAGEAL ECHOCARDIOGRAM (TEE);  Surgeon: [name], MD;  Location[facility];  Service: Open Heart Surgery;  Laterality: N/A;  . TRANSTHORACIC ECHOCARDIOGRAM  02/14/2016   EF 50-55% with moderate LVH. Possible inferolateral hypokinesis. Moderate-severe MR with posterior leaflet  prolapse, cannot rule out flail. Severe LA dilation. Moderate TR.     Family History  Problem Relation Age of Onset  . Hypertension Mother   . Stroke Mother   . Heart disease Father   . Heart attack Father   . Dementia Sister   . Retinoblastoma Son   . COPD Neg Hx   . Cancer Neg Hx   . Diabetes Neg Hx      Social History   Socioeconomic History  . Marital status: Married    Spouse name: Not on file  . Number of children: Not on file  . Years of education: Not on file  . Highest education level: Not on file  Social Needs  . Financial resource strain: Not on file  . Food insecurity - worry: Not on file  . Food insecurity - inability: Not on file  . Transportation needs - medical: Not on file  . Transportation needs - non-medical: Not on file  Occupational History  . Occupation: retired  Tobacco Use  . Smoking status: Never Smoker  . Smokeless tobacco: Never Used  Substance and Sexual Activity  . Alcohol use: No  . Drug use: No  . Sexual activity: No  Other Topics Concern  . Not on file  Social History Narrative  . Not on file     BP 110/80   Pulse 67   Ht 5\' 11"  (1.803 m)   Wt 195 lb (88.5 kg)   SpO2 98%   BMI 27.20 kg/m   Physical Exam:  Well appearing 83 year old man, NAD HEENT: Unremarkable Neck:  6 cm JVD,  no thyromegally Lymphatics:  No adenopathy Back:  No CVA tenderness Lungs:  Clear, with no wheezes, rales, or rhonchi HEART:  Regular rate rhythm, no murmurs, no rubs, no clicks Abd:  soft, positive bowel sounds, no organomegally, no rebound, no guarding Ext:  2 plus pulses, no edema, no cyanosis, no clubbing Skin:  No rashes no nodules Neuro:  CN II through XII intact, motor grossly intact  ICD interrogation - normal device function  Assessment and plan  1. Ventricular tachycardia - I have discussed the treatment options with the patient. We will reduce his dose of sotalol to 120 mg daily.   Additional rec's will depend on how he does.    2. Chronic systolic and diastolic heart failure - his symptoms have worsened on the sotalol. Hopefully reducing his dose will prevent him from pacing 3. ICD - we tried to prevent RV pacing today but he has underlying heart block on this dose of sotalol. His sotalol has been reduced.  4. MR - he is s/p valve surgery and has no obvious MR on exam today.  Lewayne Bunting, M.D.

## 2017-05-16 ENCOUNTER — Encounter: Payer: Self-pay | Admitting: *Deleted

## 2017-05-16 ENCOUNTER — Telehealth: Payer: Self-pay | Admitting: Unknown Physician Specialty

## 2017-05-16 LAB — CUP PACEART INCLINIC DEVICE CHECK
Battery Remaining Longevity: 122 mo
Battery Voltage: 3.01 V
Brady Statistic AP VP Percent: 1.78 %
Brady Statistic AP VS Percent: 1.54 %
Brady Statistic AS VP Percent: 15.71 %
Brady Statistic AS VS Percent: 80.97 %
Brady Statistic RA Percent Paced: 3.27 %
Brady Statistic RV Percent Paced: 17.08 %
Date Time Interrogation Session: 20181114155234
HighPow Impedance: 66 Ohm
Implantable Lead Implant Date: 20171025
Implantable Lead Implant Date: 20171025
Implantable Lead Location: 753859
Implantable Lead Location: 753860
Implantable Lead Model: 5076
Implantable Pulse Generator Implant Date: 20171025
Lead Channel Impedance Value: 418 Ohm
Lead Channel Impedance Value: 456 Ohm
Lead Channel Impedance Value: 513 Ohm
Lead Channel Pacing Threshold Amplitude: 0.625 V
Lead Channel Pacing Threshold Amplitude: 0.75 V
Lead Channel Pacing Threshold Pulse Width: 0.4 ms
Lead Channel Pacing Threshold Pulse Width: 0.4 ms
Lead Channel Sensing Intrinsic Amplitude: 3.25 mV
Lead Channel Sensing Intrinsic Amplitude: 4.75 mV
Lead Channel Setting Pacing Amplitude: 2 V
Lead Channel Setting Pacing Amplitude: 2.5 V
Lead Channel Setting Pacing Pulse Width: 0.4 ms
Lead Channel Setting Sensing Sensitivity: 0.3 mV

## 2017-05-16 NOTE — Telephone Encounter (Signed)
Debbie, RN from [facility] stating that after talking to the pt he voiced thatt he was having frequency and urgency, foul smelling urine and is experiencing some confusion. Notified Rn that triage nurse will call the pt to check on pt with his current symptoms.

## 2017-05-16 NOTE — Telephone Encounter (Signed)
Called pt after hearing from [place] health nurse about pt experiencing possible symptoms of a UTI. Pt states he is not currently experiencing any symptoms other than normal and states that he does not feel confused. Pt states he is on Sotalol and thinks that is what is causing his urine to smell. Pt does not want to make appt and states he will call back he starts to experience symptoms.

## 2017-05-16 NOTE — Telephone Encounter (Signed)
This encounter was created in error - please disregard.

## 2017-05-20 NOTE — Progress Notes (Signed)
Follow-up Outpatient Visit Date: 05/21/2017  Primary Care Provider: Gabriel Cirri, NP [address] [place] [address]  Chief Complaint: Fatigue  HPI:  [name] is a 83 y.o. year-old male with history of severe mitral regurgitation status post mitral valve repair (03/2016), jugular tachycardia status post ICD (03/2016), nonischemic cardiomyopathy, nonobstructive CAD, hypertension, hyperlipidemia, and chronic kidney disease, who presents for follow-up of mitral regurgitation and chronic systolic heart failure. He was hospitalized last month with an appropriate ICD shock for VT/VF. He was started on sotalol and has not had any recurrent shocks. However, he reported increased fatigue and was found to be ventricularly paced when he followed up with Dr. Ladona Ridgel on 11/30. Sotalol was therefore decreased down to 120 mg daily from BID.  Today, Roy Palmer states that he still feels "draggy," albeit less so than when he was taking sotalol twice daily. He denies chest pain, shortness of breath, palpitations, and lightheadedness. He has not had any further ICD shocks since his hospitalization last month. He has been less active due to the weather and decreased energy. He has tried taking sotalol in the early afternoon and reports feeling better in the morning. However, by mid afternoon, he is quite fatigued again.  --------------------------------------------------------------------------------------------------  Cardiovascular History & Procedures: Cardiovascular Problems:  Severe mitral regurgitation status post repair (03/2016)  Chronic systolic heart failure secondary to nonischemic cardiomyopathy  Frequent ventricular ectopy with sustained ventricular tachycardia status post ICD  Nonobstructive coronary artery disease  Risk Factors:  Age, male gender, hypertension, and hyperlipidemia  Cath/PCI:  LHC/RHC (02/23/16): Mild, nonobstructive CAD (30% ostial LMCA, 30% ostial LAD, 30% ostial  ramus, and minimal luminal irregularities of dominant LCx. Normal right heart filling pressures. Decreased Fick cardiac output (CO 3.6 L/m, CI 1.8 L/m/m) in setting of frequent PVCs and severe MR.  CV Surgery:  Minimally invasive mitral valve repair (03/19/16, Dr. Cornelius Moras)  EP Procedures and Devices:  24-hour Holter monitor (02/27/16): Predominantly sinus rhythm with average heart rate is 63 bpm (range 49-91 bpm). Longest RR interval 1.6 seconds. Frequent PVCs noted (11% burden) including frequent couplets and bigeminal cycles. 90 runs of NSVT were noted lasting up to 9 beats the maximum rate of 134 bpm. Rare SVT and supraventricular ectopy noted.  Dual chamber ICD (03/27/16, Medtronic)  Non-Invasive Evaluation(s):  TTE (04/25/17): Mildly dilated LV with moderate LVH. LVEF 25-30% with diffuse hypokinesis.Aortic sclerosis. Moderately elevated transmitral gradient status post repair. Moderate mitral regurgitation. Mild to moderate left atrial enlargement. Mildly reduced RV contraction.  Limited TTE (02/11/17): LVEF 25/30% with probable akinesis of the inferolateral and inferior myocardium. Grade 2 diastolic dysfunction. Mild LA enlargement.  TTE (09/03/16): Moderately dilated left ventricle with LVEF less than 20% and global hypokinesis. Prior surgical repair of mitral valve is evident with moderate regurgitation. Left atrium is moderately dilated. RV size and function are normal. Moderate pulmonary hypertension noted with RVSP of 54 mmHg.  Limited TTE (03/22/16): Normal LV size with moderate LVH and LVEF of 40-45% with possible inferior hypokinesis and incoordinate septal motion. Mitral angioplasty ring in place with trivial MR. No significant mitral stenosis. Mild left atrial enlargement. Normal RV was moderately reduced systolic function. RVSP 41 mm or mercury. Elevated central venous pressure.  TTE (02/14/16): Normal LV size with moderate LVH. EF mildly reduced at 50% with possible inferolateral  hypokinesis. Moderate to severe MR noted with prolapse/flail of the posterior leaflet. Severe left atrial enlargement. Moderate TR.  TEE (02/23/16): Normal LV size and wall thickness with lower normal LVEF. Trivial  AI. Severe, eccentric MR with reversal of pulmonary vein flow and flail posterior leaflet. No left atrial thrombus. Lipomatous hypertrophy of the septum. Mild to moderate TR.  Limited TTE (03/22/16): Normal LV size with moderate LVH. LVEF 40-45% with possible inferior hypokinesis. Trivial MR with mild left atrial enlargement. Normal RV size with moderately reduced systolic function. Mild TR. No pericardial effusion.  Recent CV Pertinent Labs: Lab Results  Component Value Date   CHOL 145 04/26/2017   CHOL 188 02/14/2016   HDL 35 (L) 04/26/2017   HDL 36 (L) 02/14/2016   LDLCALC 94 04/26/2017   LDLCALC 127 (H) 02/14/2016   TRIG 81 04/26/2017   CHOLHDL 4.1 04/26/2017   INR 1.11 04/25/2017   BNP 805.0 (H) 09/12/2016   K 4.5 04/26/2017   MG 2.2 04/24/2017   BUN 18 04/26/2017   BUN 27 09/18/2016   CREATININE 1.29 (H) 04/26/2017    Past medical and surgical history were reviewed and updated in EPIC.  Current Meds  Medication Sig  . aspirin EC 81 MG tablet Take 1 tablet (81 mg total) by mouth daily.  Marland Kitchen. losartan (COZAAR) 25 MG tablet Take 0.5 tablets (12.5 mg total) by mouth daily.  Marland Kitchen. omeprazole (PRILOSEC) 20 MG capsule Take 20 mg daily by mouth.  . sotalol (BETAPACE) 120 MG tablet Take 1 tablet (120 mg total) by mouth daily.    Allergies: Amiodarone and Lyrica [pregabalin]  Social History   Socioeconomic History  . Marital status: Married    Spouse name: Not on file  . Number of children: Not on file  . Years of education: Not on file  . Highest education level: Not on file  Social Needs  . Financial resource strain: Not on file  . Food insecurity - worry: Not on file  . Food insecurity - inability: Not on file  . Transportation needs - medical: Not on file  .  Transportation needs - non-medical: Not on file  Occupational History  . Occupation: retired  Tobacco Use  . Smoking status: Never Smoker  . Smokeless tobacco: Never Used  Substance and Sexual Activity  . Alcohol use: No  . Drug use: No  . Sexual activity: No  Other Topics Concern  . Not on file  Social History Narrative  . Not on file    Family History  Problem Relation Age of Onset  . Hypertension Mother   . Stroke Mother   . Heart disease Father   . Heart attack Father   . Dementia Sister   . Retinoblastoma Son   . COPD Neg Hx   . Cancer Neg Hx   . Diabetes Neg Hx     Review of Systems: A 12-system review of systems was performed and was negative except as noted in the HPI.  --------------------------------------------------------------------------------------------------  Physical Exam: BP 118/70 (BP Location: Left Arm, Patient Position: Sitting, Cuff Size: Normal)   Pulse 66   Ht 5\' 11"  (1.803 m)   Wt 198 lb (89.8 kg)   BMI 27.62 kg/m   General:  Overweight elderly man, seated comfortably in the exam room. He is accompanied by his son. HEENT: No conjunctival pallor or scleral icterus. Moist mucous membranes.  OP clear. Neck: Supple without lymphadenopathy, thyromegaly, JVD, or HJR.  Lungs: Normal work of breathing. Clear to auscultation bilaterally without wheezes or crackles. Heart: Regular rate and rhythm with 1/6 systolic murmur loudest at the apex. Abd: Bowel sounds present. Soft, NT/ND without hepatosplenomegaly Ext: No lower extremity edema. Radial, PT,  and DP pulses are 2+ bilaterally. Skin: Warm and dry without rash.  EKG:  AV paced (HR 66 bpm) with PR interval of 348 ms.  Lab Results  Component Value Date   WBC 8.6 04/27/2017   HGB 13.6 04/27/2017   HCT 39.9 (L) 04/27/2017   MCV 94.4 04/27/2017   PLT 211 04/27/2017    Lab Results  Component Value Date   NA 137 04/26/2017   K 4.5 04/26/2017   CL 107 04/26/2017   CO2 23 04/26/2017   BUN  18 04/26/2017   CREATININE 1.29 (H) 04/26/2017   GLUCOSE 101 (H) 04/26/2017   ALT 15 07/30/2016    Lab Results  Component Value Date   CHOL 145 04/26/2017   HDL 35 (L) 04/26/2017   LDLCALC 94 04/26/2017   TRIG 81 04/26/2017   CHOLHDL 4.1 04/26/2017    --------------------------------------------------------------------------------------------------  ASSESSMENT AND PLAN: Chronic systolic heart failure due to nonischemic cardiomyopathy Roy Palmer appears euvolemic but unfortunately has been more fatigued over the last month. His LVEF remains severely reduced, complicated by moderate MR. He has been intolerant of escalation of evidence based heart failure therapy due to fatigue, hypotension, and worsening renal insufficiency. This has included uptitration of ARB and addition of spironolactone. He is no longer on carvedilol, as sotalol was started during his recent hospitalization for VT with ICD shock.  Mitral regurgitation s/p mitral valve repair He is s/p mitral valve repair last year. Most recent echo showed moderate MR with severely reduced LVEF. He has been intolerant of more aggressive afterload reduction. No medication changes today.  Ventricular tachycardia Roy Palmer was hospitalized for appropriate shock for VT last month. He was started on sotalol 120 mg BID at the instruction of Dr. Ladona Ridgel. Due to considerable fatigue and RV pacing, this was cut down to 120 mg daily at Roy Palmer' follow-up visit with Dr. Ladona Ridgel. Despite this, Roy Palmer is still fatigued and appears to be AV pacing today. I will touch base with Dr. Ladona Ridgel regarding other options for Roy Palmer. He has been intolerant of amiodarone and mexiletine in the past.  Follow-up: Return to clinic in 3 months.  [name], MD 05/21/2017 2:35 PM

## 2017-05-21 ENCOUNTER — Ambulatory Visit (INDEPENDENT_AMBULATORY_CARE_PROVIDER_SITE_OTHER): Payer: Medicare Other | Admitting: Internal Medicine

## 2017-05-21 VITALS — BP 118/70 | HR 66 | Ht 71.0 in | Wt 198.0 lb

## 2017-05-21 DIAGNOSIS — Z9889 Other specified postprocedural states: Secondary | ICD-10-CM | POA: Diagnosis not present

## 2017-05-21 DIAGNOSIS — I428 Other cardiomyopathies: Secondary | ICD-10-CM

## 2017-05-21 DIAGNOSIS — I5022 Chronic systolic (congestive) heart failure: Secondary | ICD-10-CM

## 2017-05-21 DIAGNOSIS — I340 Nonrheumatic mitral (valve) insufficiency: Secondary | ICD-10-CM

## 2017-05-21 DIAGNOSIS — I472 Ventricular tachycardia, unspecified: Secondary | ICD-10-CM

## 2017-05-21 DIAGNOSIS — I38 Endocarditis, valve unspecified: Secondary | ICD-10-CM | POA: Diagnosis not present

## 2017-05-21 NOTE — Patient Instructions (Signed)
Medication Instructions:  Your physician recommends that you continue on your current medications as directed. Please refer to the Current Medication list given to you today.   Labwork: none  Testing/Procedures: none  Follow-Up: Your physician recommends that you schedule a follow-up appointment in: 3 MONTHS WITH DR END.   If you need a refill on your cardiac medications before your next appointment, please call your pharmacy.   

## 2017-05-22 ENCOUNTER — Encounter: Payer: Self-pay | Admitting: Internal Medicine

## 2017-05-23 IMAGING — CR DG CHEST 2V
2 series · 2 of 2 positions shown · non-contrast
Comparison: Chest x-ray of February 29, 2016

CLINICAL DATA: CHF. Preoperative examination for mitral valve
replacement. History of thoracic aortic aneurysm.

EXAM:
CHEST  2 VIEW

[chest pa]
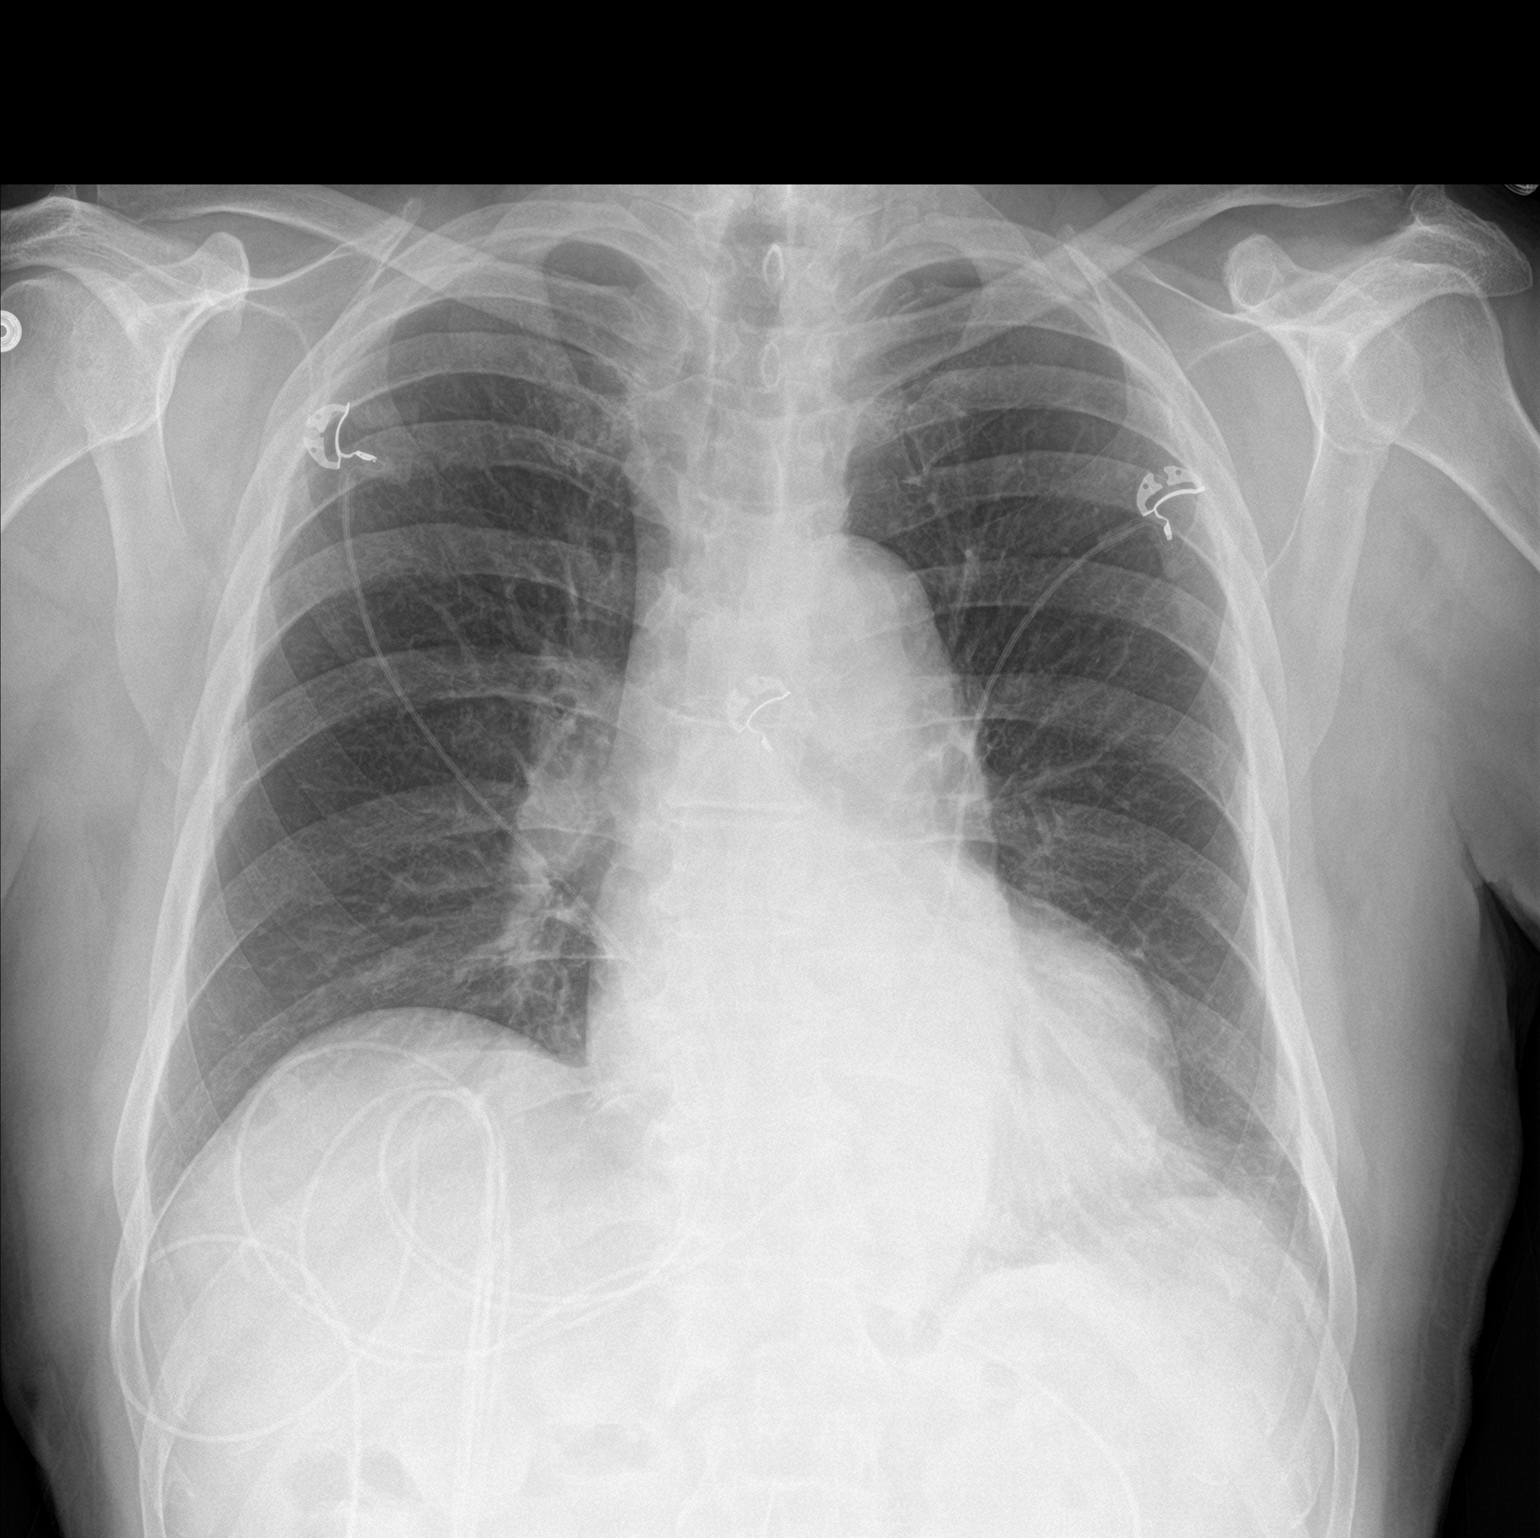

[chest lat]
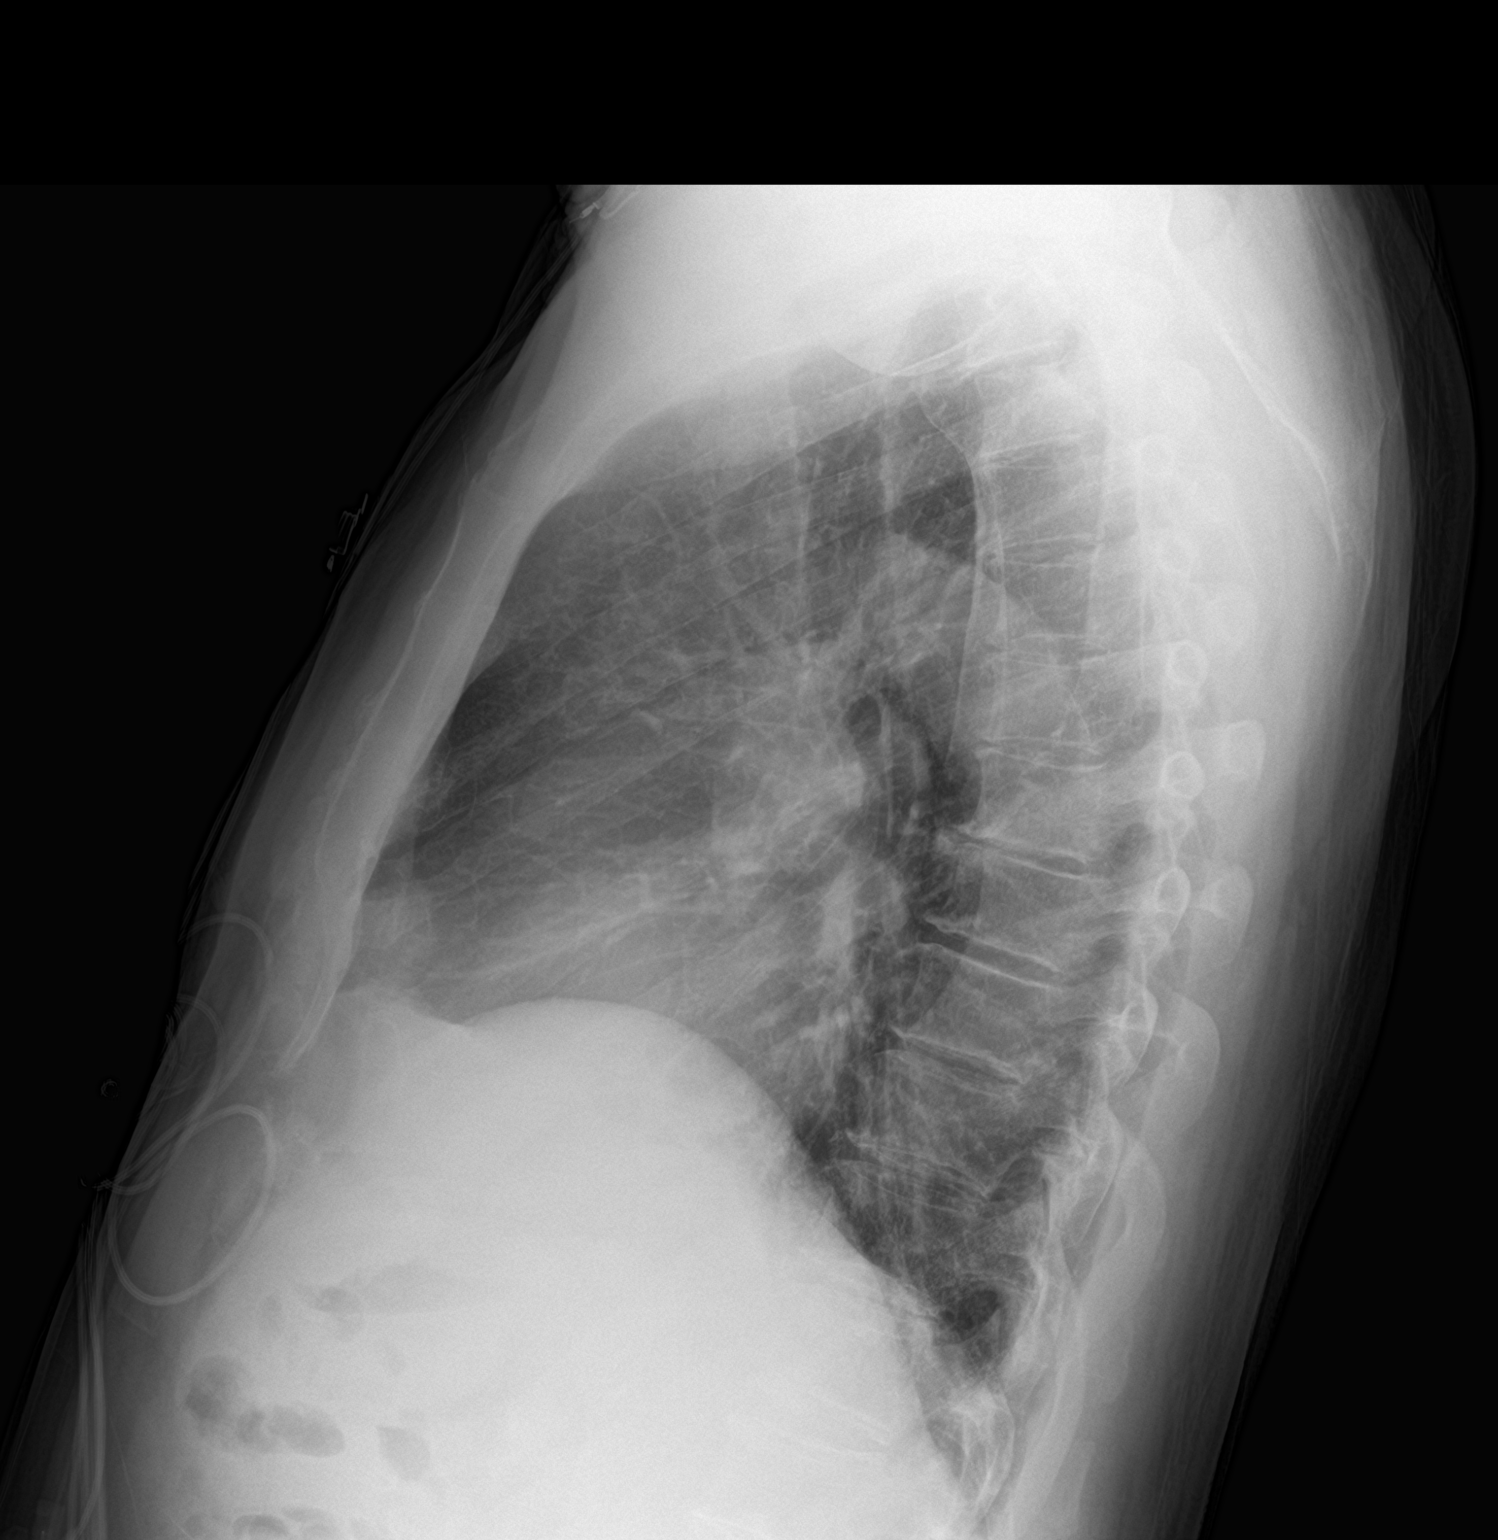

[2 of 2 positions shown; findings below may reference images not displayed]

FINDINGS: The lungs are adequately inflated and clear. The heart and pulmonary
vascularity are normal. There is faint calcification in the wall of
the aortic arch. There is mild tortuosity of the descending thoracic
aorta. There is no pleural effusion. The bony thorax exhibits no
acute abnormality.
IMPRESSION: There is no active cardiopulmonary disease.

Aortic atherosclerosis.

## 2017-05-29 IMAGING — CR DG CHEST 1V PORT
1 series · 1 of 1 positions shown · non-contrast
Comparison: 03/20/16

CLINICAL DATA: Atelectasis and shortness of breath

EXAM:
PORTABLE CHEST 1 VIEW

[AP]
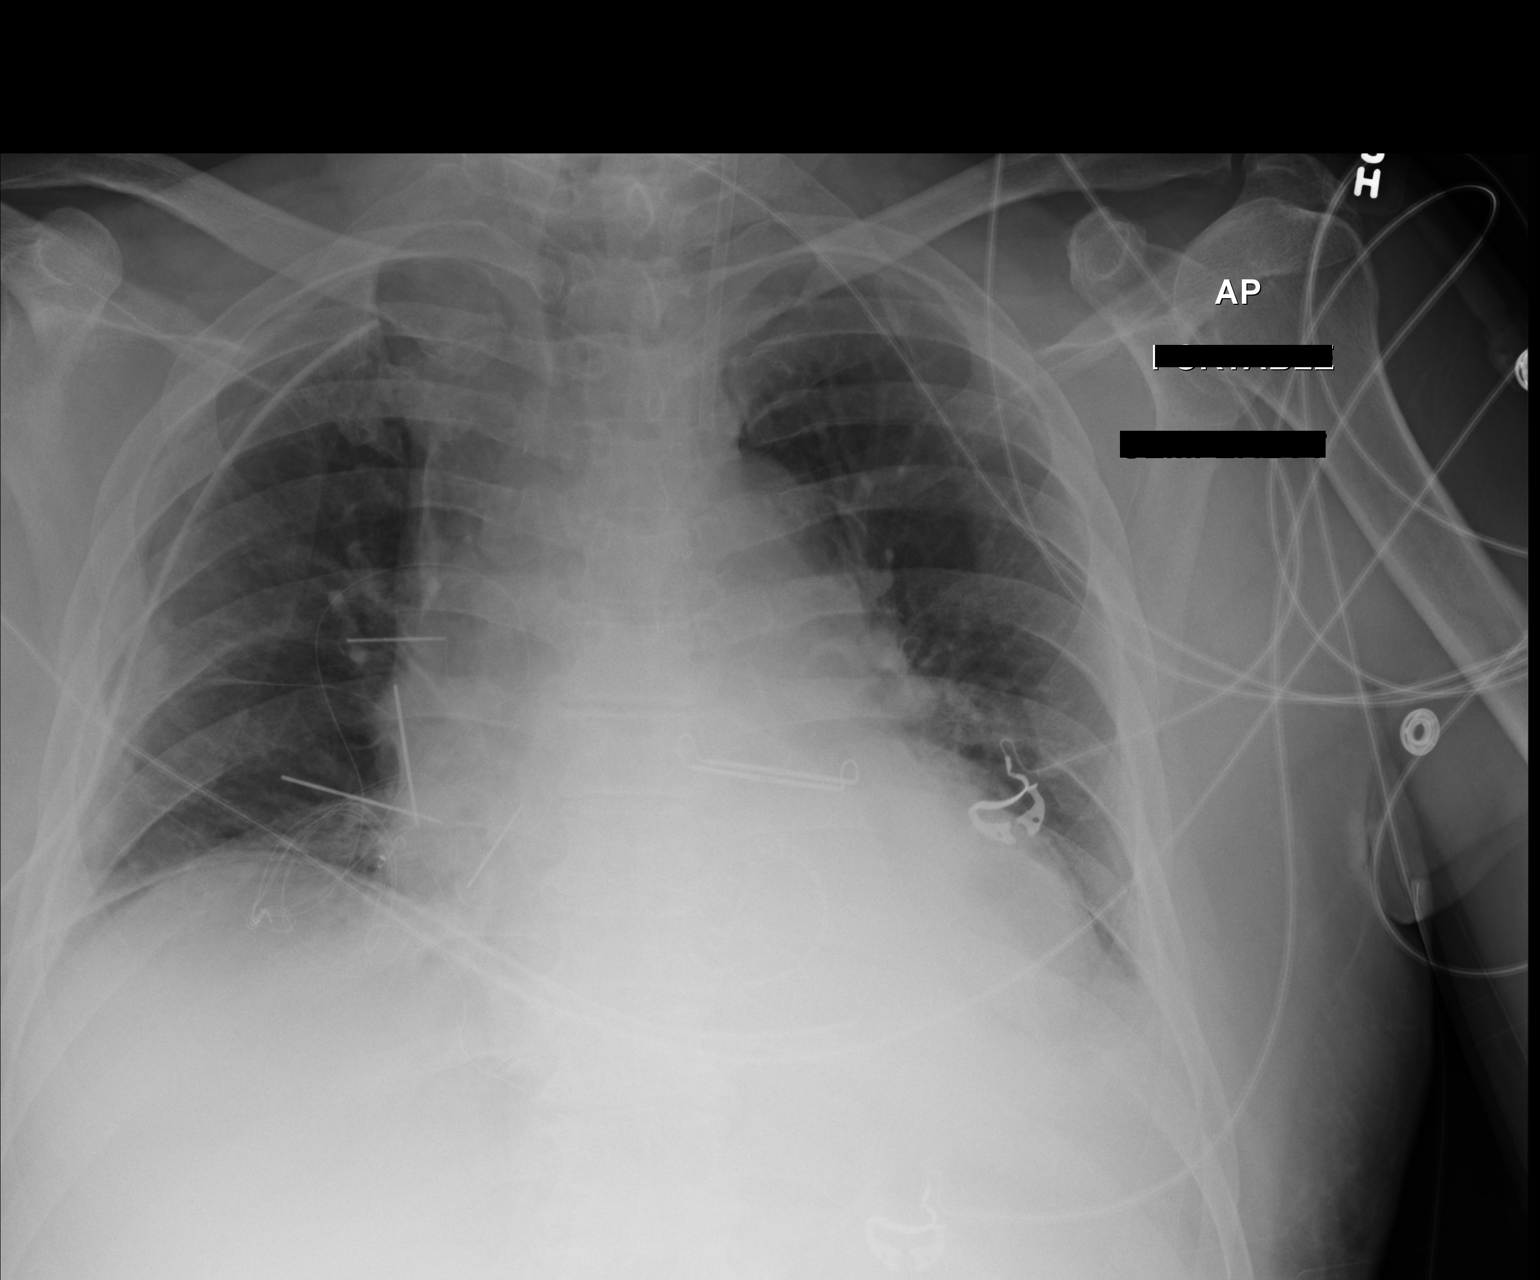

[1 of 1 positions shown; findings below may reference images not displayed]

FINDINGS: Cardiac shadow is enlarged but stable. Postoperative changes are
again seen. The previously noted Swan-Ganz catheter has been
removed. Right-sided thoracostomy catheter remains in place. No
pneumothorax is seen. No significant infiltrate is noted. A
pericardial drain remains in place. Left jugular central venous
sheath is again noted and kinked at skin surface. Minimal right
basilar atelectasis is noted.
IMPRESSION: Postsurgical changes with tubes and lines as described above.
Minimal right basilar atelectasis remains.

## 2017-06-11 ENCOUNTER — Encounter: Payer: Self-pay | Admitting: Unknown Physician Specialty

## 2017-06-11 ENCOUNTER — Ambulatory Visit: Payer: Medicare Other | Admitting: Unknown Physician Specialty

## 2017-06-11 ENCOUNTER — Ambulatory Visit (INDEPENDENT_AMBULATORY_CARE_PROVIDER_SITE_OTHER): Payer: Medicare Other

## 2017-06-11 VITALS — BP 122/72 | HR 72 | Temp 98.0°F | Resp 16 | Ht 70.0 in | Wt 195.2 lb

## 2017-06-11 DIAGNOSIS — I130 Hypertensive heart and chronic kidney disease with heart failure and stage 1 through stage 4 chronic kidney disease, or unspecified chronic kidney disease: Secondary | ICD-10-CM

## 2017-06-11 DIAGNOSIS — I10 Essential (primary) hypertension: Secondary | ICD-10-CM | POA: Diagnosis not present

## 2017-06-11 DIAGNOSIS — Z7189 Other specified counseling: Secondary | ICD-10-CM

## 2017-06-11 DIAGNOSIS — Z0000 Encounter for general adult medical examination without abnormal findings: Secondary | ICD-10-CM

## 2017-06-11 NOTE — Patient Instructions (Signed)
Roy Palmer , Thank you for taking time to come for your Medicare Wellness Visit. I appreciate your ongoing commitment to your health goals. Please review the following plan we discussed and let me know if I can assist you in the future.   Screening recommendations/referrals: Colonoscopy: no longer required Recommended yearly ophthalmology/optometry visit for glaucoma screening and checkup Recommended yearly dental visit for hygiene and checkup  Vaccinations: Influenza vaccine: up to date  Pneumococcal vaccine: up to date Tdap vaccine: up to date  Shingles vaccine: up to date  Advanced directives: Please bring a copy of your health care power of attorney and living will to the office at your convenience.  Conditions/risks identified: Recommend drinking at least 6-8 glasses of water a day   Next appointment: Follow up in one year for your annual wellness exam.   Preventive Care 65 Years and Older, Male Preventive care refers to lifestyle choices and visits with your health care provider that can promote health and wellness. What does preventive care include?  A yearly physical exam. This is also called an annual well check.  Dental exams once or twice a year.  Routine eye exams. Ask your health care provider how often you should have your eyes checked.  Personal lifestyle choices, including:  Daily care of your teeth and gums.  Regular physical activity.  Eating a healthy diet.  Avoiding tobacco and drug use.  Limiting alcohol use.  Practicing safe sex.  Taking low doses of aspirin every day.  Taking vitamin and mineral supplements as recommended by your health care provider. What happens during an annual well check? The services and screenings done by your health care provider during your annual well check will depend on your age, overall health, lifestyle risk factors, and family history of disease. Counseling  Your health care provider may ask you questions about  your:  Alcohol use.  Tobacco use.  Drug use.  Emotional well-being.  Home and relationship well-being.  Sexual activity.  Eating habits.  History of falls.  Memory and ability to understand (cognition).  Work and work Astronomer. Screening  You may have the following tests or measurements:  Height, weight, and BMI.  Blood pressure.  Lipid and cholesterol levels. These may be checked every 5 years, or more frequently if you are over 81 years old.  Skin check.  Lung cancer screening. You may have this screening every year starting at age 37 if you have a 30-pack-year history of smoking and currently smoke or have quit within the past 15 years.  Fecal occult blood test (FOBT) of the stool. You may have this test every year starting at age 47.  Flexible sigmoidoscopy or colonoscopy. You may have a sigmoidoscopy every 5 years or a colonoscopy every 10 years starting at age 39.  Prostate cancer screening. Recommendations will vary depending on your family history and other risks.  Hepatitis C blood test.  Hepatitis B blood test.  Sexually transmitted disease (STD) testing.  Diabetes screening. This is done by checking your blood sugar (glucose) after you have not eaten for a while (fasting). You may have this done every 1-3 years.  Abdominal aortic aneurysm (AAA) screening. You may need this if you are a current or former smoker.  Osteoporosis. You may be screened starting at age 62 if you are at high risk. Talk with your health care provider about your test results, treatment options, and if necessary, the need for more tests. Vaccines  Your health care provider may  recommend certain vaccines, such as:  Influenza vaccine. This is recommended every year.  Tetanus, diphtheria, and acellular pertussis (Tdap, Td) vaccine. You may need a Td booster every 10 years.  Zoster vaccine. You may need this after age 10.  Pneumococcal 13-valent conjugate (PCV13) vaccine.  One dose is recommended after age 61.  Pneumococcal polysaccharide (PPSV23) vaccine. One dose is recommended after age 72. Talk to your health care provider about which screenings and vaccines you need and how often you need them. This information is not intended to replace advice given to you by your health care provider. Make sure you discuss any questions you have with your health care provider. Document Released: 06/16/2015 Document Revised: 02/07/2016 Document Reviewed: 03/21/2015 Elsevier Interactive Patient Education  2017 [place] Prevention in the Home Falls can cause injuries. They can happen to people of all ages. There are many things you can do to make your home safe and to help prevent falls. What can I do on the outside of my home?  Regularly fix the edges of walkways and driveways and fix any cracks.  Remove anything that might make you trip as you walk through a door, such as a raised step or threshold.  Trim any bushes or trees on the path to your home.  Use bright outdoor lighting.  Clear any walking paths of anything that might make someone trip, such as rocks or tools.  Regularly check to see if handrails are loose or broken. Make sure that both sides of any steps have handrails.  Any raised decks and porches should have guardrails on the edges.  Have any leaves, snow, or ice cleared regularly.  Use sand or salt on walking paths during winter.  Clean up any spills in your garage right away. This includes oil or grease spills. What can I do in the bathroom?  Use night lights.  Install grab bars by the toilet and in the tub and shower. Do not use towel bars as grab bars.  Use non-skid mats or decals in the tub or shower.  If you need to sit down in the shower, use a plastic, non-slip stool.  Keep the floor dry. Clean up any water that spills on the floor as soon as it happens.  Remove soap buildup in the tub or shower regularly.  Attach bath  mats securely with double-sided non-slip rug tape.  Do not have throw rugs and other things on the floor that can make you trip. What can I do in the bedroom?  Use night lights.  Make sure that you have a light by your bed that is easy to reach.  Do not use any sheets or blankets that are too big for your bed. They should not hang down onto the floor.  Have a firm chair that has side arms. You can use this for support while you get dressed.  Do not have throw rugs and other things on the floor that can make you trip. What can I do in the kitchen?  Clean up any spills right away.  Avoid walking on wet floors.  Keep items that you use a lot in easy-to-reach places.  If you need to reach something above you, use a strong step stool that has a grab bar.  Keep electrical cords out of the way.  Do not use floor polish or wax that makes floors slippery. If you must use wax, use non-skid floor wax.  Do not have throw rugs and  other things on the floor that can make you trip. What can I do with my stairs?  Do not leave any items on the stairs.  Make sure that there are handrails on both sides of the stairs and use them. Fix handrails that are broken or loose. Make sure that handrails are as long as the stairways.  Check any carpeting to make sure that it is firmly attached to the stairs. Fix any carpet that is loose or worn.  Avoid having throw rugs at the top or bottom of the stairs. If you do have throw rugs, attach them to the floor with carpet tape.  Make sure that you have a light switch at the top of the stairs and the bottom of the stairs. If you do not have them, ask someone to add them for you. What else can I do to help prevent falls?  Wear shoes that:  Do not have high heels.  Have rubber bottoms.  Are comfortable and fit you well.  Are closed at the toe. Do not wear sandals.  If you use a stepladder:  Make sure that it is fully opened. Do not climb a closed  stepladder.  Make sure that both sides of the stepladder are locked into place.  Ask someone to hold it for you, if possible.  Clearly mark and make sure that you can see:  Any grab bars or handrails.  First and last steps.  Where the edge of each step is.  Use tools that help you move around (mobility aids) if they are needed. These include:  Canes.  Walkers.  Scooters.  Crutches.  Turn on the lights when you go into a dark area. Replace any light bulbs as soon as they burn out.  Set up your furniture so you have a clear path. Avoid moving your furniture around.  If any of your floors are uneven, fix them.  If there are any pets around you, be aware of where they are.  Review your medicines with your doctor. Some medicines can make you feel dizzy. This can increase your chance of falling. Ask your doctor what other things that you can do to help prevent falls. This information is not intended to replace advice given to you by your health care provider. Make sure you discuss any questions you have with your health care provider. Document Released: 03/16/2009 Document Revised: 10/26/2015 Document Reviewed: 06/24/2014 Elsevier Interactive Patient Education  2017 [employer].

## 2017-06-11 NOTE — Assessment & Plan Note (Signed)
A voluntary discussion about advance care planning including the explanation and discussion of advance directives was extensively discussed  with the patient.  Explanation about the health care proxy and Living will was reviewed and packet with forms with explanation of how to fill them out was given.  Pt has filled out documents but wants to change the power of attorney for son as primary to daughter.   Patient was offered a separate Advance Care Planning visit for further assistance with forms.

## 2017-06-11 NOTE — Assessment & Plan Note (Signed)
Stable, continue present medications.   

## 2017-06-11 NOTE — Progress Notes (Signed)
Subjective:   Roy Palmer is a 84 y.o. male who presents for Medicare Annual/Subsequent preventive examination.  Review of Systems:   Cardiac Risk Factors include: male gender;hypertension;advanced age (>28men, >37 women);dyslipidemia     Objective:    Vitals: BP 122/72 (BP Location: Left Arm, Patient Position: Sitting)   Pulse 72   Temp 98 F (36.7 C) (Oral)   Resp 16   Ht 5\' 10"  (1.778 m)   Wt 195 lb 3.2 oz (88.5 kg)   BMI 28.01 kg/m   Body mass index is 28.01 kg/m.  Advanced Directives 06/11/2017 04/25/2017 04/24/2017 06/24/2016 03/14/2016  Does Patient Have a Medical Advance Directive? Yes Yes Yes Yes No  Type of Advance Directive Living will;Healthcare Power of Attorney Living will Living will - -  Does patient want to make changes to medical advance directive? - No - Patient declined - - -  Copy of Healthcare Power of Attorney in Chart? No - copy requested - - - -  Would patient like information on creating a medical advance directive? - - - - No - patient declined information    Tobacco Social History   Tobacco Use  Smoking Status Never Smoker  Smokeless Tobacco Never Used     Counseling given: Not Answered   Clinical Intake:  Pre-visit preparation completed: Yes  Pain : No/denies pain     Nutritional Status: BMI 25 -29 Overweight Nutritional Risks: None Diabetes: No  How often do you need to have someone help you when you read instructions, pamphlets, or other written materials from your doctor or pharmacy?: 1 - Never What is the last grade level you completed in school?: 2 years college   Interpreter Needed?: No  Information entered by :: Hayzel Ruberg,LPN   Past Medical History:  Diagnosis Date  . AAA (abdominal aortic aneurysm) without rupture (HCC) 03/12/2016   Small - measured 3.3 x 3.5 cm by CTA  . CKD (chronic kidney disease) stage 3, GFR 30-59 ml/min (HCC)   . Essential hypertension   . Hyperlipidemia   . Hypertensive kidney disease    . Incidental pulmonary nodule 03/12/2016   Vague opacity right lung base requires radiographic follow up - index of suspicion is LOW  . Near syncope    Cardiogenic, related to an SVT and severe MR  . Non-sustained ventricular tachycardia (HCC)   . S/P minimally invasive mitral valve repair 03/19/2016   Complex valvuloplasty including triangular resection of flail segment of posterior leaflet, chordal transposition x1, artificial Gore-tex neochord placement x4 and 34 mm Sorin Memo 3D ring annuloplasty via right mini thoracotomy approach with clipping of LA appendage  . Severe mitral regurgitation by prior echocardiogram 02/23/2016   Confirmed by TEE  . Syncope 03/15/2016  . Thoracic aortic aneurysm (HCC) 03/12/2016   Very very mild fusiform enlargement of distal transverse and proximal descending thoracic aorta noted on CTA  . Trigeminal neuralgia    Past Surgical History:  Procedure Laterality Date  . CARDIAC CATHETERIZATION N/A 02/23/2016   Procedure: Right/Left Heart Cath and Coronary Angiography;  Surgeon: [name], MD;  Location: [facility] INVASIVE CV LAB;  Service: Cardiovascular: Angiographic minimal coronary disease. Normal left and right heart Pressures. Decreased CO/CI in settng of frequent PVCs & Severe MR.  Marland Kitchen CATARACT EXTRACTION, BILATERAL    . COLONOSCOPY  08/2005  . CYST EXCISION  10/2013   from neck  . EP IMPLANTABLE DEVICE N/A 03/27/2016   Procedure: ICD Implant Dual Chamber;  Surgeon: Marinus Maw, MD;  Location: [facility] INVASIVE CV LAB;  Service: Cardiovascular;  Laterality: N/A;  . ESOPHAGOGASTRODUODENOSCOPY  02/2010  . MITRAL VALVE REPAIR Right 03/19/2016   Procedure: MINIMALLY INVASIVE MITRAL VALVE REPAIR (MVR);  Surgeon: [name], MD;  Location: [facility] OR;  Service: Open Heart Surgery;  Laterality: Right;  . TEE WITHOUT CARDIOVERSION N/A 02/23/2016   Procedure: TRANSESOPHAGEAL ECHOCARDIOGRAM (TEE);  Surgeon: Quintella Reichert, MD;  Location: [facility] ENDOSCOPY;  Service:  Cardiovascular: Normal LV size and function.  Degenerative mitral valve disease with failed posterior leaflet (P2 segment), Severe MR with pulmonary vein systolic flow reversal. Moderate-TR.    . TEE WITHOUT CARDIOVERSION N/A 03/19/2016   Procedure: TRANSESOPHAGEAL ECHOCARDIOGRAM (TEE);  Surgeon: [name], MD;  Location: [facility] OR;  Service: Open Heart Surgery;  Laterality: N/A;  . TRANSTHORACIC ECHOCARDIOGRAM  02/14/2016   EF 50-55% with moderate LVH. Possible inferolateral hypokinesis. Moderate-severe MR with posterior leaflet prolapse, cannot rule out flail. Severe LA dilation. Moderate TR.   Family History  Problem Relation Age of Onset  . Hypertension Mother   . Stroke Mother   . Heart disease Father   . Heart attack Father   . Dementia Sister   . Retinoblastoma Son   . COPD Neg Hx   . Cancer Neg Hx   . Diabetes Neg Hx    Social History   Socioeconomic History  . Marital status: Married    Spouse name: None  . Number of children: None  . Years of education: None  . Highest education level: None  Social Needs  . Financial resource strain: Not hard at all  . Food insecurity - worry: Never true  . Food insecurity - inability: Never true  . Transportation needs - medical: No  . Transportation needs - non-medical: No  Occupational History  . Occupation: retired  Tobacco Use  . Smoking status: Never Smoker  . Smokeless tobacco: Never Used  Substance and Sexual Activity  . Alcohol use: No  . Drug use: No  . Sexual activity: No  Other Topics Concern  . None  Social History Narrative  . None    Outpatient Encounter Medications as of 06/11/2017  Medication Sig  . aspirin EC 81 MG tablet Take 1 tablet (81 mg total) by mouth daily.  Marland Kitchen losartan (COZAAR) 25 MG tablet Take 0.5 tablets (12.5 mg total) by mouth daily.  Marland Kitchen omeprazole (PRILOSEC) 20 MG capsule Take 20 mg daily by mouth.  . sotalol (BETAPACE) 120 MG tablet Take 1 tablet (120 mg total) by mouth daily.   No  facility-administered encounter medications on file as of 06/11/2017.     Activities of Daily Living In your present state of health, do you have any difficulty performing the following activities: 06/11/2017 04/25/2017  Hearing? N N  Vision? N N  Difficulty concentrating or making decisions? N N  Walking or climbing stairs? N N  Dressing or bathing? N N  Doing errands, shopping? N N  Preparing Food and eating ? N -  Using the Toilet? N -  In the past six months, have you accidently leaked urine? N -  Do you have problems with loss of bowel control? N -  Managing your Medications? N -  Managing your Finances? N -  Housekeeping or managing your Housekeeping? N -  Some recent data might be hidden    Patient Care Team: Gabriel Cirri, NP as PCP - General (Nurse Practitioner) End, Cristal Deer, MD as Consulting Physician (Cardiology)   Assessment:   This  is a routine wellness examination for [place].  Exercise Activities and Dietary recommendations Current Exercise Habits: Home exercise routine, Time (Minutes): 15, Frequency (Times/Week): 7, Weekly Exercise (Minutes/Week): 105, Intensity: Mild, Exercise limited by: None identified  Goals    . DIET - INCREASE WATER INTAKE     Recommend drinking at least 6-8 glasses of water a day        Fall Risk Fall Risk  06/11/2017 06/24/2016 02/08/2016  Falls in the past year? No No No   Is the patient's home free of loose throw rugs in walkways, pet beds, electrical cords, etc?   yes      Grab bars in the bathroom? yes      Handrails on the stairs?   yes      Adequate lighting?   yes  Timed Get Up and Go Performed: completed in 8 seconds with no use of assistive devices. Steady gait. No intervention needed at this time.   Depression Screen PHQ 2/9 Scores 06/11/2017 06/24/2016  PHQ - 2 Score 0 0  PHQ- 9 Score - 0    Cognitive Function     6CIT Screen 06/11/2017  What Year? 0 points  What month? 0 points  What time? 0 points  Count back from  20 0 points  Months in reverse 0 points  Repeat phrase 2 points  Total Score 2    Immunization History  Administered Date(s) Administered  . Influenza,inj,Quad PF,6+ Mos 05/07/2016, 02/19/2017  . Influenza-Unspecified 04/19/2015  . Pneumococcal Conjugate-13 10/29/2013  . Pneumococcal Polysaccharide-23 07/14/2008  . Td 07/14/2008  . Zoster 06/03/2010    Qualifies for Shingles Vaccine? Up to date, discussed shingrix vaccine  Screening Tests Health Maintenance  Topic Date Due  . TETANUS/TDAP  07/14/2018  . INFLUENZA VACCINE  Completed  . PNA vac Low Risk Adult  Completed   Cancer Screenings: Lung: Low Dose CT Chest recommended if Age 13-80 years, 30 pack-year currently smoking OR have quit w/in 15years. Patient does not qualify. Colorectal: no longer required  Additional Screenings:  Hepatitis B/HIV/Syphillis: not indicated Hepatitis C Screening: not indicated    Plan:    I have personally reviewed and addressed the Medicare Annual Wellness questionnaire and have noted the following in the patient's chart:  A. Medical and social history B. Use of alcohol, tobacco or illicit drugs  C. Current medications and supplements D. Functional ability and status E.  Nutritional status F.  Physical activity G. Advance directives H. List of other physicians I.  Hospitalizations, surgeries, and ER visits in previous 12 months J.  Vitals K. Screenings such as hearing and vision if needed, cognitive and depression L. Referrals and appointments   In addition, I have reviewed and discussed with patient certain preventive protocols, quality metrics, and best practice recommendations. A written personalized care plan for preventive services as well as general preventive health recommendations were provided to patient.   Signed,  Marin Roberts, LPN Nurse Health Advisor   Nurse Notes: none

## 2017-06-11 NOTE — Progress Notes (Signed)
BP 122/72   Pulse 72   Temp 98 F (36.7 C) (Oral)   Ht 5\' 10"  (1.778 m)   Wt 195 lb 3.2 oz (88.5 kg)   BMI 28.01 kg/m    Subjective:    Patient ID: Roy Palmer, male    DOB: 1930/02/22, 84 y.o.   MRN: [id number]  HPI: Roy Palmer is a 84 y.o. male  Chief Complaint  Patient presents with  . Annual Exam    pt had wellness exam with [facility] today   Systolic Heart Failure Seeing Dr. Okey Dupre and Dr Ladona Ridgel at [place].  Notes reviewed. Has a pacemaker and getting close f/u.  Complaints of fatigue  Hypertension Using medications without difficulty Average home BPs   No problems or lightheadedness No chest pain with exertion or shortness of breath No Edema   Hyperlipidemia Using medications without problems: No Muscle aches  Diet compliance: Exercise:    Relevant past medical, surgical, family and social history reviewed and updated as indicated. Interim medical history since our last visit reviewed. Allergies and medications reviewed and updated.  Review of Systems  Constitutional: Positive for fatigue.  HENT: Negative.   Eyes: Negative.   Respiratory: Negative.   Cardiovascular: Negative for chest pain, palpitations and leg swelling.  Gastrointestinal: Negative.   Genitourinary: Negative.   Psychiatric/Behavioral: Negative.     Per HPI unless specifically indicated above     Objective:    BP 122/72   Pulse 72   Temp 98 F (36.7 C) (Oral)   Ht 5\' 10"  (1.778 m)   Wt 195 lb 3.2 oz (88.5 kg)   BMI 28.01 kg/m   Wt Readings from Last 3 Encounters:  06/11/17 195 lb 3.2 oz (88.5 kg)  06/11/17 195 lb 3.2 oz (88.5 kg)  05/21/17 198 lb (89.8 kg)    Physical Exam  Constitutional: He is oriented to person, place, and time. He appears well-developed and well-nourished.  HENT:  Head: Normocephalic.  Right Ear: Tympanic membrane, external ear and ear canal normal.  Left Ear: Tympanic membrane, external ear and ear canal normal.  Mouth/Throat: Uvula is  midline, oropharynx is clear and moist and mucous membranes are normal.  Eyes: Pupils are equal, round, and reactive to light.  Cardiovascular: Normal rate, regular rhythm and normal heart sounds. Exam reveals no gallop and no friction rub.  No murmur heard. Pulmonary/Chest: Effort normal and breath sounds normal. No respiratory distress.  Abdominal: Soft. Bowel sounds are normal. He exhibits no distension. There is no tenderness.  Musculoskeletal: Normal range of motion.  Neurological: He is alert and oriented to person, place, and time. He has normal reflexes.  Skin: Skin is warm and dry.  Psychiatric: He has a normal mood and affect. His behavior is normal. Judgment and thought content normal.     Assessment & Plan:   Problem List Items Addressed This Visit      Unprioritized   Advanced care planning/counseling discussion    A voluntary discussion about advance care planning including the explanation and discussion of advance directives was extensively discussed  with the patient.  Explanation about the health care proxy and Living will was reviewed and packet with forms with explanation of how to fill them out was given.  Pt has filled out documents but wants to change the power of attorney for son as primary to daughter.   Patient was offered a separate Advance Care Planning visit for further assistance with forms.  Essential hypertension    Stable, continue present medications.        Hypertensive heart and kidney disease    Stable, continue present medications.            Follow up plan: Return in about 6 months (around 12/09/2017).

## 2017-06-23 ENCOUNTER — Ambulatory Visit (INDEPENDENT_AMBULATORY_CARE_PROVIDER_SITE_OTHER): Payer: Medicare Other | Admitting: *Deleted

## 2017-06-23 DIAGNOSIS — I38 Endocarditis, valve unspecified: Secondary | ICD-10-CM

## 2017-06-23 DIAGNOSIS — I5022 Chronic systolic (congestive) heart failure: Secondary | ICD-10-CM

## 2017-06-23 DIAGNOSIS — I428 Other cardiomyopathies: Secondary | ICD-10-CM

## 2017-06-23 LAB — CUP PACEART REMOTE DEVICE CHECK
Battery Remaining Longevity: 115 mo
Battery Voltage: 3.01 V
Brady Statistic AP VP Percent: 55.96 %
Brady Statistic AP VS Percent: 6.81 %
Brady Statistic AS VP Percent: 34.5 %
Brady Statistic AS VS Percent: 2.72 %
Brady Statistic RA Percent Paced: 62.23 %
Brady Statistic RV Percent Paced: 90.53 %
Date Time Interrogation Session: 20190121083625
HighPow Impedance: 67 Ohm
Implantable Lead Implant Date: 20171025
Implantable Lead Implant Date: 20171025
Implantable Lead Location: 753859
Implantable Lead Location: 753860
Implantable Lead Model: 5076
Implantable Pulse Generator Implant Date: 20171025
Lead Channel Impedance Value: 361 Ohm
Lead Channel Impedance Value: 456 Ohm
Lead Channel Impedance Value: 456 Ohm
Lead Channel Pacing Threshold Amplitude: 0.625 V
Lead Channel Pacing Threshold Amplitude: 0.625 V
Lead Channel Pacing Threshold Pulse Width: 0.4 ms
Lead Channel Pacing Threshold Pulse Width: 0.4 ms
Lead Channel Sensing Intrinsic Amplitude: 2.875 mV
Lead Channel Sensing Intrinsic Amplitude: 2.875 mV
Lead Channel Sensing Intrinsic Amplitude: 5.375 mV
Lead Channel Sensing Intrinsic Amplitude: 5.375 mV
Lead Channel Setting Pacing Amplitude: 2 V
Lead Channel Setting Pacing Amplitude: 2.5 V
Lead Channel Setting Pacing Pulse Width: 0.4 ms
Lead Channel Setting Sensing Sensitivity: 0.3 mV

## 2017-06-23 IMAGING — CR DG CHEST 2V
2 series · 2 of 2 positions shown · non-contrast
Comparison: 03/28/2016 and 03/25/2016 CXR

CLINICAL DATA: Mitral valve replacement 03/19/2016.

EXAM:
CHEST  2 VIEW

[w chest pa]
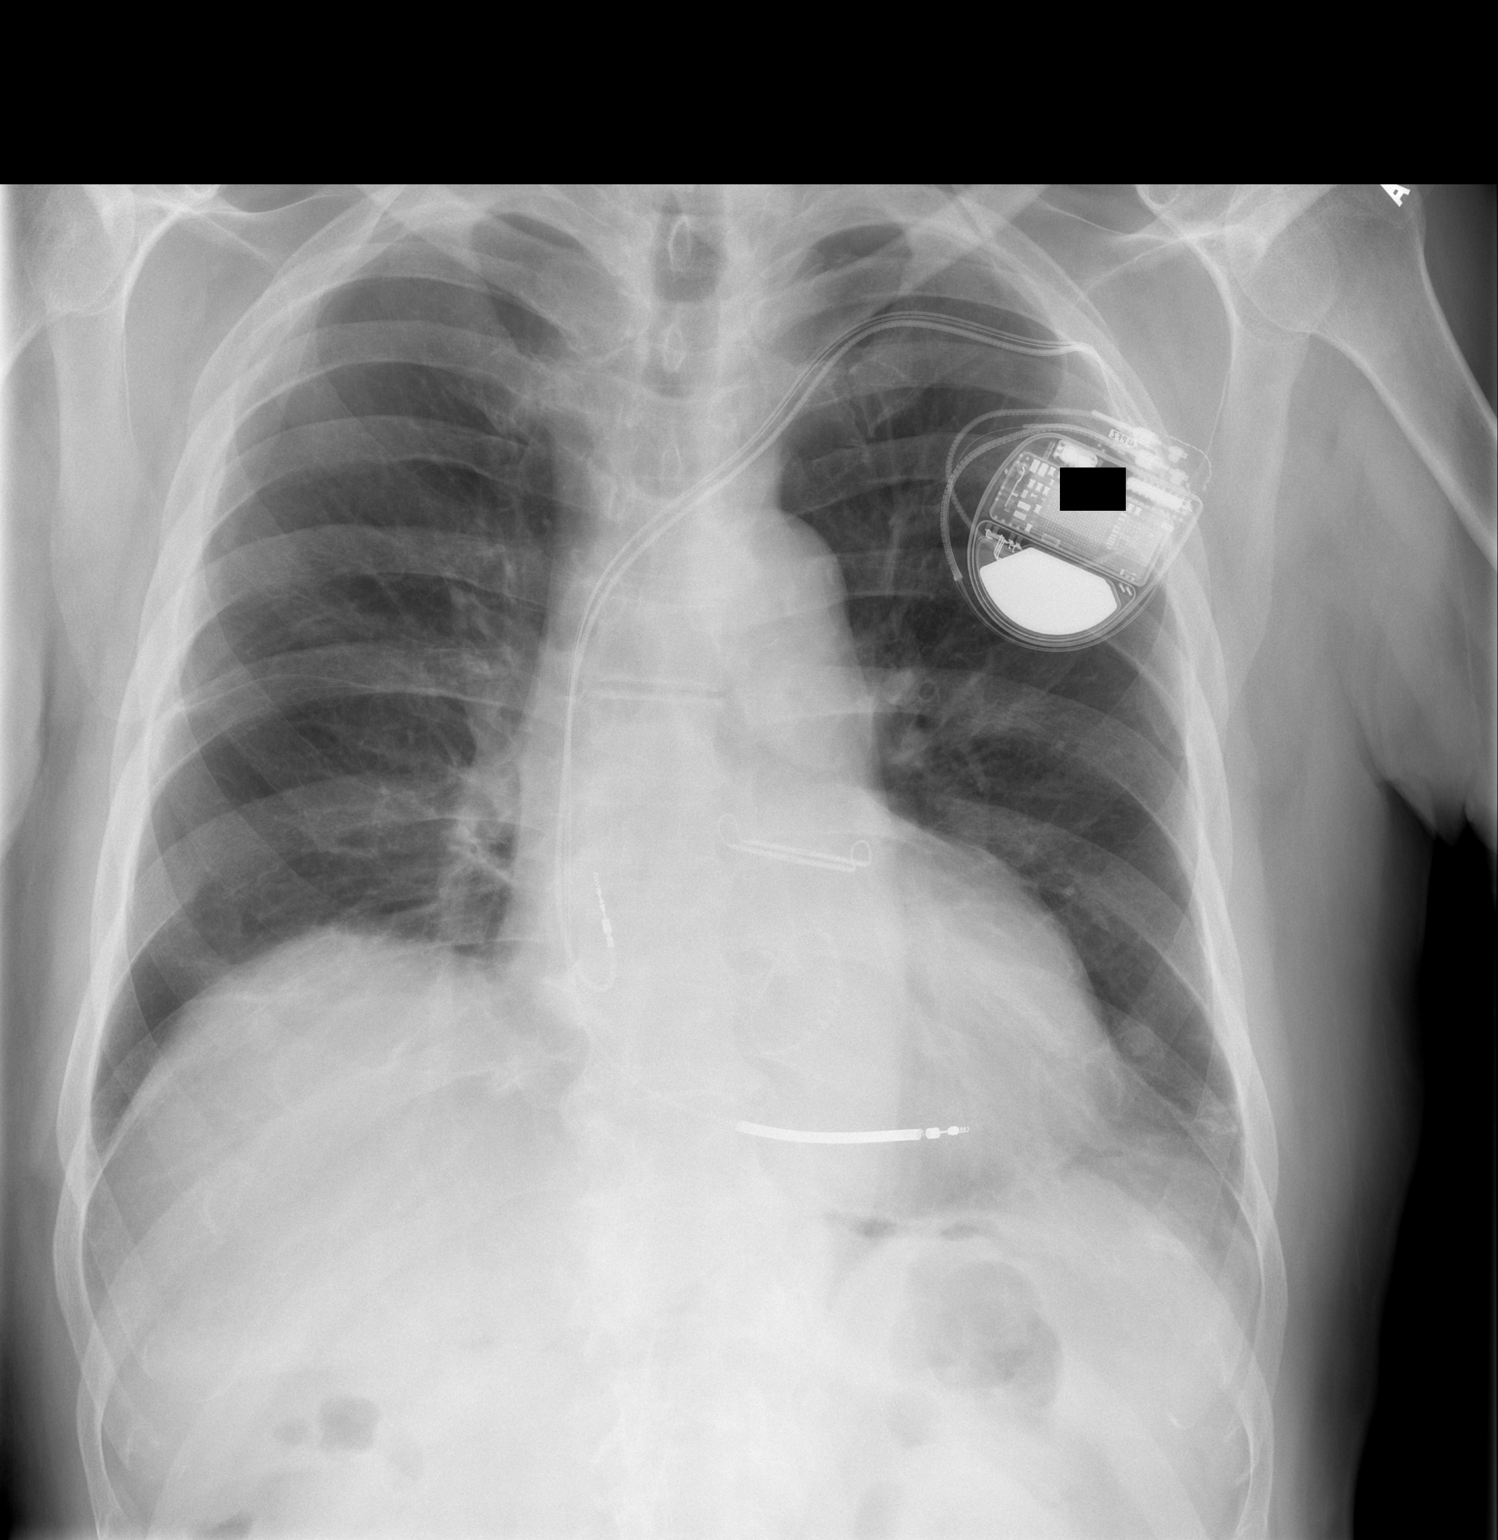

[w chest lat]
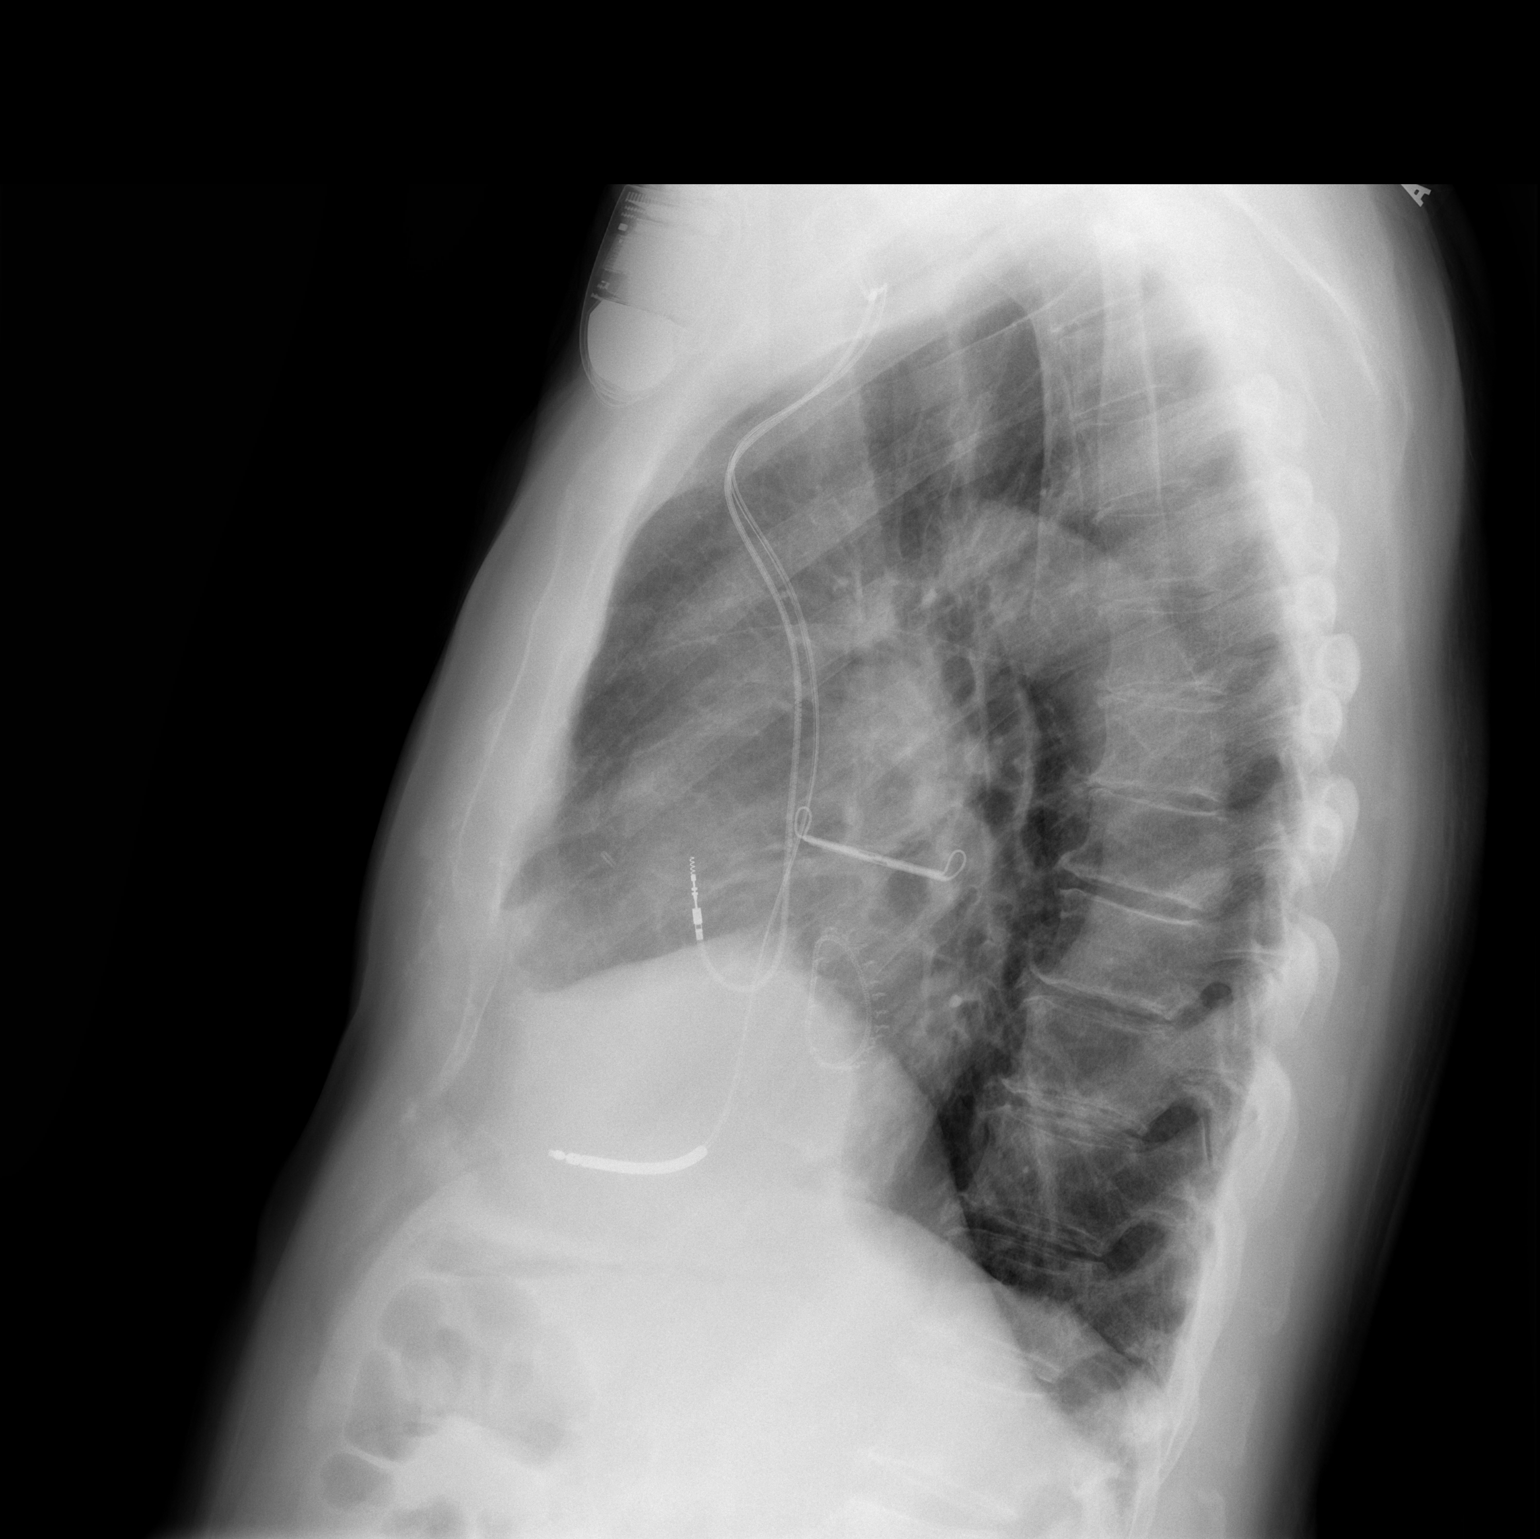

[2 of 2 positions shown; findings below may reference images not displayed]

FINDINGS: Heart is top-normal in size. There has been clearing of the small
bilateral pleural effusions. Mitral valvular replacement is noted
with atrial appendage clip, right atrial pacer and right ventricular
ICD leads noted. AICD device projects over the left upper hemi
thorax. The lungs are slightly hyperinflated without pneumonic
consolidation. There is minimal bibasilar atelectasis. Nodular
densities in each lung base consistent bilateral nipple shadows. The
aorta is slightly uncoiled in appearance. No acute suspicious
osseous abnormalities.
IMPRESSION: Status post mitral valvular replacement. Left AICD device with
stable lead position. Left atrial appendage occlusive device is
stable position. Clearing of bilateral small pleural effusions. No
acute pulmonary disease.

## 2017-06-23 NOTE — Progress Notes (Signed)
Remote ICD transmission.   

## 2017-06-24 ENCOUNTER — Encounter: Payer: Self-pay | Admitting: Cardiology

## 2017-06-26 ENCOUNTER — Telehealth: Payer: Self-pay | Admitting: Internal Medicine

## 2017-06-26 DIAGNOSIS — I10 Essential (primary) hypertension: Secondary | ICD-10-CM

## 2017-06-26 DIAGNOSIS — I5022 Chronic systolic (congestive) heart failure: Secondary | ICD-10-CM

## 2017-06-26 DIAGNOSIS — I38 Endocarditis, valve unspecified: Secondary | ICD-10-CM

## 2017-06-26 DIAGNOSIS — Z79899 Other long term (current) drug therapy: Secondary | ICD-10-CM

## 2017-06-26 NOTE — Telephone Encounter (Signed)
Unfortunately, Roy Palmer has been intolerant of multiple cardiac medications that are important for management of his chronic systolic heart failure. Let's try ramipril 1.25 mg daily instead of losartan and have him come to the office in 1-2 weeks for a BMP and BP check. Thanks.  [name], MD [facility] Pager: [phone]

## 2017-06-26 NOTE — Telephone Encounter (Signed)
I called and spoke with the patient. He states he has had no energy for almost a year and just feeling really bad- he could not report any additional side effects. He stopped his losartan 4 days ago and feels much better. His SBP has been running 133-136 since he stopped this.  I advised him to continue to monitor his BP and that we will forward to Dr. Okey Dupre for further review. He is aware we will call him back with Dr. Serita Kyle recommendations and is agreeable.

## 2017-06-26 NOTE — Telephone Encounter (Signed)
Pt wold like to discuss a "substitute" for losartan. States it is making him feel like he has no energy. Please call. Pt states he has stopped this medication and he feels great.

## 2017-06-27 MED ORDER — RAMIPRIL 1.25 MG PO CAPS: 1.2500 mg | ORAL_CAPSULE | Freq: Every day | ORAL | 3 refills | Status: DC

## 2017-06-27 NOTE — Telephone Encounter (Signed)
S/w patient. He verbalized understand to stop losartan and start ramipril 1.25mg  by mouth daily. He will come on 07/04/17 at 1030 to our office for BP check and lab work. He was very Adult nurse.

## 2017-06-30 ENCOUNTER — Telehealth: Payer: Self-pay | Admitting: Internal Medicine

## 2017-06-30 NOTE — Telephone Encounter (Signed)
Pt calling stating the ramipril is also giving him some issues  He states he is having really bad night sweats along with BP being low  He states its about 69/80 He is not sure what else he can take but wants to try something else   Please advise

## 2017-06-30 NOTE — Telephone Encounter (Signed)
S/w patient. He started ramipril on Friday 06/27/17 in place of losartan he was not tolerating. Patient reports cold sweats at night around 0300-0500 in the morning. He feels like he has no energy and is weaker on this medication. Denies dizziness, chest pain, shortness of breath or swelling. Blood pressure have been lower per patient. Last night, 106/78. Today 100/69. The rest of the weekend he reports it being 100's/60-70's. Heart rate in the 80's.  Advised I will route to Dr End for advice.

## 2017-06-30 NOTE — Telephone Encounter (Signed)
Roy Palmer has been intolerant of ACEI/ARB and aldosterone antagonists. He should stop ramipril and continue his other medications.  [name], MD [facility] Pager: [phone]

## 2017-07-01 NOTE — Telephone Encounter (Signed)
Called and s/w patient. He verbalized understanding to stop ramipril and continue other medications. He will call us if BP is consistently greater than 140/90.

## 2017-07-04 ENCOUNTER — Ambulatory Visit (INDEPENDENT_AMBULATORY_CARE_PROVIDER_SITE_OTHER): Payer: Medicare Other

## 2017-07-04 ENCOUNTER — Other Ambulatory Visit (INDEPENDENT_AMBULATORY_CARE_PROVIDER_SITE_OTHER): Payer: Medicare Other | Admitting: *Deleted

## 2017-07-04 VITALS — BP 124/68 | HR 55 | Resp 16

## 2017-07-04 DIAGNOSIS — I38 Endocarditis, valve unspecified: Secondary | ICD-10-CM

## 2017-07-04 DIAGNOSIS — I10 Essential (primary) hypertension: Secondary | ICD-10-CM | POA: Diagnosis not present

## 2017-07-04 DIAGNOSIS — Z79899 Other long term (current) drug therapy: Secondary | ICD-10-CM

## 2017-07-04 DIAGNOSIS — I5022 Chronic systolic (congestive) heart failure: Secondary | ICD-10-CM

## 2017-07-04 NOTE — Patient Instructions (Addendum)
1.) Reason for visit: BP check/labs  2.) Name of MD requesting visit: Dr. Okey Dupre  3.) H&P: Pt with history of HTN with multiple medication intolerances.  4.) ROS related to problem: Losartan switched to ramipril January 25 due to losartan intolerance. Pt then reported symptoms while taking ramipril thus this medication was stopped January 28. He continues to take sotalol 120mg  qd at 6pm.   5.) Assessment and plan per MD: BP 124/68 HR 55 today. Pt reports BP has been controlled and he is feeling well. Based on today's readings and home readings, he will continue current medications as prescribed. Will route to Dr. Okey Dupre for review and advise if any changes are needed in POC.  Pt's next f/u visit with Dr. Okey Dupre, March 20.   Pt's BP/HR home log as follows since discontinuing ramipril:  130/62   75 120/71  76 120/69  81 117/71  72

## 2017-07-05 LAB — BASIC METABOLIC PANEL WITH GFR
BUN/Creatinine Ratio: 16 (ref 10–24)
BUN: 24 mg/dL (ref 8–27)
CO2: 23 mmol/L (ref 20–29)
Calcium: 9.4 mg/dL (ref 8.6–10.2)
Chloride: 102 mmol/L (ref 96–106)
Creatinine, Ser: 1.49 mg/dL — ABNORMAL HIGH (ref 0.76–1.27)
GFR calc Af Amer: 49 mL/min/{1.73_m2} — ABNORMAL LOW
GFR calc non Af Amer: 42 mL/min/{1.73_m2} — ABNORMAL LOW
Glucose: 110 mg/dL — ABNORMAL HIGH (ref 65–99)
Potassium: 5.2 mmol/L (ref 3.5–5.2)
Sodium: 141 mmol/L (ref 134–144)

## 2017-07-06 NOTE — Progress Notes (Signed)
Blood pressure is well-controlled. Creatinine is slightly above baseline. Given intolerance to ramipril (and multiple other ARB/ACEI agents), we will not make any medication changes at this time.

## 2017-07-16 ENCOUNTER — Telehealth: Payer: Self-pay | Admitting: Internal Medicine

## 2017-07-16 MED ORDER — AMLODIPINE BESYLATE 2.5 MG PO TABS: 2.5000 mg | ORAL_TABLET | Freq: Every day | ORAL | 2 refills | Status: DC

## 2017-07-16 NOTE — Telephone Encounter (Signed)
S/w patient and he verbalized understanding to start amlodipine 2.5 mg (1 tablet) by mouth once a day. Rx sent to pharmacy.

## 2017-07-16 NOTE — Telephone Encounter (Signed)
Patient is having issues with blood pressure Day before 153/82 156/80 He was told if it went over 140 to give Victorino Dike a call Please call to discuss

## 2017-07-16 NOTE — Telephone Encounter (Signed)
BP is elevated, particularly given his h/o cardiomyopathy and MR. Unfortunately, he has been intolerant of numerous evidence-based HF medications and is only on ASA and sotalol at this time. I recommend that we try amlodipine 2.5 mg daily. He should continue to monitor his blood pressure and let us know if he feels worse.  [name], MD [facility] Pager: [phone]

## 2017-07-16 NOTE — Telephone Encounter (Signed)
S/w patient. He noticed his BP started to increase on Sunday. Sunday 150/80 Monday 153/82 Tuesday 156/80 Did not record HR. Decided to take a losartan 25 mg yesterday and today BP is 130/72. He states he does think he does not feel good since taking that dose of losartan like it did last time. Denies chest pain, shortness of breath or dizziness. Only complaint is blurred vision 2 different times which lasted a few moments on Sunday evening and this morning. He is supposed to leave in the morning to go to [place] on a fishing trip. Advised I will route to Dr End for advice.

## 2017-07-31 ENCOUNTER — Encounter: Payer: Self-pay | Admitting: Internal Medicine

## 2017-07-31 ENCOUNTER — Ambulatory Visit (INDEPENDENT_AMBULATORY_CARE_PROVIDER_SITE_OTHER): Payer: Medicare Other | Admitting: Internal Medicine

## 2017-07-31 VITALS — BP 132/70 | HR 68 | Ht 70.0 in | Wt 194.0 lb

## 2017-07-31 DIAGNOSIS — I428 Other cardiomyopathies: Secondary | ICD-10-CM

## 2017-07-31 DIAGNOSIS — I5022 Chronic systolic (congestive) heart failure: Secondary | ICD-10-CM | POA: Diagnosis not present

## 2017-07-31 DIAGNOSIS — I340 Nonrheumatic mitral (valve) insufficiency: Secondary | ICD-10-CM

## 2017-07-31 DIAGNOSIS — I472 Ventricular tachycardia, unspecified: Secondary | ICD-10-CM

## 2017-07-31 DIAGNOSIS — Z95810 Presence of automatic (implantable) cardiac defibrillator: Secondary | ICD-10-CM

## 2017-07-31 LAB — CUP PACEART INCLINIC DEVICE CHECK
Battery Remaining Longevity: 111 mo
Battery Voltage: 2.99 V
Brady Statistic AP VP Percent: 55.41 %
Brady Statistic AP VS Percent: 9.84 %
Brady Statistic AS VP Percent: 31.05 %
Brady Statistic AS VS Percent: 3.7 %
Brady Statistic RA Percent Paced: 64.51 %
Brady Statistic RV Percent Paced: 86.33 %
Date Time Interrogation Session: 20190228164251
HighPow Impedance: 72 Ohm
Implantable Lead Implant Date: 20171025
Implantable Lead Implant Date: 20171025
Implantable Lead Location: 753859
Implantable Lead Location: 753860
Implantable Lead Model: 5076
Implantable Pulse Generator Implant Date: 20171025
Lead Channel Impedance Value: 418 Ohm
Lead Channel Impedance Value: 475 Ohm
Lead Channel Impedance Value: 475 Ohm
Lead Channel Pacing Threshold Amplitude: 0.75 V
Lead Channel Pacing Threshold Amplitude: 0.75 V
Lead Channel Pacing Threshold Pulse Width: 0.4 ms
Lead Channel Pacing Threshold Pulse Width: 0.4 ms
Lead Channel Sensing Intrinsic Amplitude: 3.375 mV
Lead Channel Sensing Intrinsic Amplitude: 5.125 mV
Lead Channel Setting Pacing Amplitude: 2 V
Lead Channel Setting Pacing Amplitude: 2.5 V
Lead Channel Setting Pacing Pulse Width: 0.4 ms
Lead Channel Setting Sensing Sensitivity: 0.3 mV

## 2017-07-31 NOTE — Patient Instructions (Addendum)
Medication Instructions:  Your physician recommends that you continue on your current medications as directed. Please refer to the Current Medication list given to you today.  Labwork: None ordered.  Testing/Procedures: None ordered.  Follow-Up: Your physician wants you to follow-up in: 6 months with Dr. Ladona Ridgel.   You will receive a reminder letter in the mail two months in advance. If you don't receive a letter, please call our office to schedule the follow-up appointment.  Remote monitoring is used to monitor your ICD from home. This monitoring reduces the number of office visits required to check your device to one time per year. It allows [place] to keep an eye on the functioning of your device to ensure it is working properly. You are scheduled for a device check from home on 09/22/2017. You may send your transmission at any time that day. If you have a wireless device, the transmission will be sent automatically. After your physician reviews your transmission, you will receive a postcard with your next transmission date.  Any Other Special Instructions Will Be Listed Below (If Applicable).  If you need a refill on your cardiac medications before your next appointment, please call your pharmacy.

## 2017-07-31 NOTE — Progress Notes (Signed)
HPI [name] returns today for ongoing evaluation and management of VT and chronic systolic and diastolic heart failure. He has done well in the interim. He has had no recurrent VT. He has tolerated taking sotalol 120 mg daily. He remains active despite his advanced age [place] No edema. No angina. Allergies  Allergen Reactions  . Amiodarone Other (See Comments)    Terrible dreams/nightmares  . Lyrica [Pregabalin] Nausea And Vomiting     Current Outpatient Medications  Medication Sig Dispense Refill  . amLODipine (NORVASC) 2.5 MG tablet Take 1 tablet (2.5 mg total) by mouth daily. 90 tablet 2  . aspirin EC 81 MG tablet Take 1 tablet (81 mg total) by mouth daily.    . sotalol (BETAPACE) 120 MG tablet Take 1 tablet (120 mg total) by mouth daily. 90 tablet 3   No current facility-administered medications for this visit.      Past Medical History:  Diagnosis Date  . AAA (abdominal aortic aneurysm) without rupture (HCC) 03/12/2016   Small - measured 3.3 x 3.5 cm by CTA  . CKD (chronic kidney disease) stage 3, GFR 30-59 ml/min (HCC)   . Essential hypertension   . Hyperlipidemia   . Hypertensive kidney disease   . Incidental pulmonary nodule 03/12/2016   Vague opacity right lung base requires radiographic follow up - index of suspicion is LOW  . Near syncope    Cardiogenic, related to an SVT and severe MR  . Non-sustained ventricular tachycardia (HCC)   . S/P minimally invasive mitral valve repair 03/19/2016   Complex valvuloplasty including triangular resection of flail segment of posterior leaflet, chordal transposition x1, artificial Gore-tex neochord placement x4 and 34 mm Sorin Memo 3D ring annuloplasty via right mini thoracotomy approach with clipping of LA appendage  . Severe mitral regurgitation by prior echocardiogram 02/23/2016   Confirmed by TEE  . Syncope 03/15/2016  . Thoracic aortic aneurysm (HCC) 03/12/2016   Very very mild fusiform  enlargement of distal transverse and proximal descending thoracic aorta noted on CTA  . Trigeminal neuralgia     ROS:   All systems reviewed and negative except as noted in the HPI.   Past Surgical History:  Procedure Laterality Date  . CARDIAC CATHETERIZATION N/A 02/23/2016   Procedure: Right/Left Heart Cath and Coronary Angiography;  Surgeon: [name], MD;  Location: [facility] INVASIVE CV LAB;  Service: Cardiovascular: Angiographic minimal coronary disease. Normal left and right heart Pressures. Decreased CO/CI in settng of frequent PVCs & Severe MR.  Marland Kitchen CATARACT EXTRACTION, BILATERAL    . COLONOSCOPY  08/2005  . CYST EXCISION  10/2013   from neck  . EP IMPLANTABLE DEVICE N/A 03/27/2016   Procedure: ICD Implant Dual Chamber;  Surgeon: Marinus Maw, MD;  Location: [facility] INVASIVE CV LAB;  Service: Cardiovascular;  Laterality: N/A;  . ESOPHAGOGASTRODUODENOSCOPY  02/2010  . MITRAL VALVE REPAIR Right 03/19/2016   Procedure: MINIMALLY INVASIVE MITRAL VALVE REPAIR (MVR);  Surgeon: [name], MD;  Location: [facility] OR;  Service: Open Heart Surgery;  Laterality: Right;  . TEE WITHOUT CARDIOVERSION N/A 02/23/2016   Procedure: TRANSESOPHAGEAL ECHOCARDIOGRAM (TEE);  Surgeon: Quintella Reichert, MD;  Location: [facility] ENDOSCOPY;  Service: Cardiovascular: Normal LV size and function.  Degenerative mitral valve disease with failed posterior leaflet (P2 segment), Severe MR with pulmonary vein systolic flow reversal. Moderate-TR.    . TEE WITHOUT CARDIOVERSION N/A 03/19/2016   Procedure: TRANSESOPHAGEAL ECHOCARDIOGRAM (TEE);  Surgeon: Salvatore Decent  Cornelius Moras, MD;  Location: [facility] OR;  Service: Open Heart Surgery;  Laterality: N/A;  . TRANSTHORACIC ECHOCARDIOGRAM  02/14/2016   EF 50-55% with moderate LVH. Possible inferolateral hypokinesis. Moderate-severe MR with posterior leaflet prolapse, cannot rule out flail. Severe LA dilation. Moderate TR.     Family History  Problem Relation Age of Onset  . Hypertension Mother     . Stroke Mother   . Heart disease Father   . Heart attack Father   . Dementia Sister   . Retinoblastoma Son   . COPD Neg Hx   . Cancer Neg Hx   . Diabetes Neg Hx      Social History   Socioeconomic History  . Marital status: Married    Spouse name: Not on file  . Number of children: Not on file  . Years of education: Not on file  . Highest education level: Not on file  Social Needs  . Financial resource strain: Not hard at all  . Food insecurity - worry: Never true  . Food insecurity - inability: Never true  . Transportation needs - medical: No  . Transportation needs - non-medical: No  Occupational History  . Occupation: retired  Tobacco Use  . Smoking status: Never Smoker  . Smokeless tobacco: Never Used  Substance and Sexual Activity  . Alcohol use: No  . Drug use: No  . Sexual activity: No  Other Topics Concern  . Not on file  Social History Narrative  . Not on file     BP 132/70   Pulse 68   Ht 5\' 10"  (1.778 m)   Wt 194 lb (88 kg)   BMI 27.84 kg/m   Physical Exam:  Well appearing 84 yo man, NAD HEENT: Unremarkable Neck:  6 cm JVD, no thyromegally Lymphatics:  No adenopathy Back:  No CVA tenderness Lungs:  Clear with no wheezes HEART:  Regular rate rhythm, no murmurs, no rubs, no clicks Abd:  soft, positive bowel sounds, no organomegally, no rebound, no guarding Ext:  2 plus pulses, no edema, no cyanosis, no clubbing Skin:  No rashes no nodules Neuro:  CN II through XII intact, motor grossly intact  DEVICE  Normal device function.  See PaceArt for details.   Assess/Plan: 1. VT - I have recommended her continue taking sotalol. No change in meds.  2. ICD - his medtronic ICD is working normally. Will follow. 3. Chronic systolic heart failure - he has mild-moderate LV dysfunction by echo. He will continue his current meds. Not on an ACE/ARB due to severe fatigue and worsening renal insufficiency.  Leonia Reeves.D

## 2017-08-20 ENCOUNTER — Encounter: Payer: Self-pay | Admitting: Internal Medicine

## 2017-08-20 ENCOUNTER — Ambulatory Visit (INDEPENDENT_AMBULATORY_CARE_PROVIDER_SITE_OTHER): Payer: Medicare Other | Admitting: Internal Medicine

## 2017-08-20 VITALS — BP 120/66 | HR 66 | Ht 71.0 in | Wt 192.0 lb

## 2017-08-20 DIAGNOSIS — I5022 Chronic systolic (congestive) heart failure: Secondary | ICD-10-CM | POA: Diagnosis not present

## 2017-08-20 DIAGNOSIS — I428 Other cardiomyopathies: Secondary | ICD-10-CM

## 2017-08-20 DIAGNOSIS — I10 Essential (primary) hypertension: Secondary | ICD-10-CM

## 2017-08-20 DIAGNOSIS — I38 Endocarditis, valve unspecified: Secondary | ICD-10-CM | POA: Diagnosis not present

## 2017-08-20 NOTE — Patient Instructions (Signed)
Medication Instructions:  Your physician recommends that you continue on your current medications as directed. Please refer to the Current Medication list given to you today.   Labwork: none  Testing/Procedures: none  Follow-Up: Your physician recommends that you schedule a follow-up appointment in: 3 MONTHS WITH DR END.   If you need a refill on your cardiac medications before your next appointment, please call your pharmacy.   

## 2017-08-21 NOTE — Progress Notes (Signed)
Follow-up Outpatient Visit Date: 08/20/2017  Primary Care Provider: Gabriel Cirri, NP [address] [place] [address]  Chief Complaint: Follow-up nonischemic cardiomyopathy and valvular heart disease  HPI:  Roy Palmer is a 84 y.o. year-old male with history of severe mitral regurgitation status post mitral valve repair (03/2016), circular tachycardia status post ICD (03/2016), nonischemic cardiomyopathy, nonobstructive CAD, hypertension, hyperlipidemia, and chronic kidney disease III, who presents for follow-up of chronic systolic heart failure and valvular heart disease.  I last saw Roy Palmer in December.  Since then, he has been doing well.  He denies chest pain, shortness of breath, palpitations, lightheadedness, orthopnea, PND, and edema.  He recently saw Dr. Ladona Ridgel in EP clinic and was noted to have some VT that was treated with ATP.  He was maintained on sotalol.  --------------------------------------------------------------------------------------------------  Cardiovascular History & Procedures: Cardiovascular Problems:  Severe mitral regurgitation status post repair (03/2016)  Chronic systolic heart failure secondary to nonischemic cardiomyopathy  Frequent ventricular ectopy with sustained ventricular tachycardia status post ICD  Nonobstructive coronary artery disease  Risk Factors:  Age, male gender, hypertension, and hyperlipidemia  Cath/PCI:  LHC/RHC (02/23/16): Mild, nonobstructive CAD (30% ostial LMCA, 30% ostial LAD, 30% ostial ramus, and minimal luminal irregularities of dominant LCx. Normal right heart filling pressures. Decreased Fick cardiac output (CO 3.6 L/m, CI 1.8 L/m/m) in setting of frequent PVCs and severe MR.  CV Surgery:  Minimally invasive mitral valve repair (03/19/16, Dr. Cornelius Moras)  EP Procedures and Devices:  24-hour Holter monitor (02/27/16): Predominantly sinus rhythm with average heart rate is 63 bpm (range 49-91 bpm). Longest RR interval  1.6 seconds. Frequent PVCs noted (11% burden) including frequent couplets and bigeminal cycles. 90 runs of NSVT were noted lasting up to 9 beats the maximum rate of 134 bpm. Rare SVT and supraventricular ectopy noted.  Dual chamber ICD (03/27/16, [employer])  Non-Invasive Evaluation(s):  TTE (04/25/17): Mildly dilated LV with moderate LVH. LVEF 25-30% with diffuse hypokinesis.Aortic sclerosis. Moderately elevated transmitral gradient status post repair. Moderate mitral regurgitation. Mild to moderate left atrial enlargement. Mildly reduced RV contraction.  Limited TTE (02/11/17): LVEF 25/30% with probable akinesis of the inferolateral and inferior myocardium. Grade 2 diastolic dysfunction. Mild LA enlargement.  TTE (09/03/16): Moderately dilated left ventricle with LVEF less than 20% and global hypokinesis. Prior surgical repair of mitral valve is evident with moderate regurgitation. Left atrium is moderately dilated. RV size and function are normal. Moderate pulmonary hypertension noted with RVSP of 54 mmHg.  Limited TTE (03/22/16): Normal LV size with moderate LVH and LVEF of 40-45% with possible inferior hypokinesis and incoordinate septal motion. Mitral angioplasty ring in place with trivial MR. No significant mitral stenosis. Mild left atrial enlargement. Normal RV was moderately reduced systolic function. RVSP 41 mm or mercury. Elevated central venous pressure.  TTE (02/14/16): Normal LV size with moderate LVH. EF mildly reduced at 50% with possible inferolateral hypokinesis. Moderate to severe MR noted with prolapse/flail of the posterior leaflet. Severe left atrial enlargement. Moderate TR.  TEE (02/23/16): Normal LV size and wall thickness with lower normal LVEF. Trivial AI. Severe, eccentric MR with reversal of pulmonary vein flow and flail posterior leaflet. No left atrial thrombus. Lipomatous hypertrophy of the septum. Mild to moderate TR.  Limited TTE (03/22/16): Normal LV size with  moderate LVH. LVEF 40-45% with possible inferior hypokinesis. Trivial MR with mild left atrial enlargement. Normal RV size with moderately reduced systolic function. Mild TR. No pericardial effusion.   Recent CV Pertinent Labs: Lab Results  Component Value Date   CHOL 145 04/26/2017   CHOL 188 02/14/2016   HDL 35 (L) 04/26/2017   HDL 36 (L) 02/14/2016   LDLCALC 94 04/26/2017   LDLCALC 127 (H) 02/14/2016   TRIG 81 04/26/2017   CHOLHDL 4.1 04/26/2017   INR 1.11 04/25/2017   BNP 805.0 (H) 09/12/2016   K 5.2 07/04/2017   MG 2.2 04/24/2017   BUN 24 07/04/2017   CREATININE 1.49 (H) 07/04/2017    Past medical and surgical history were reviewed and updated in EPIC.  Current Meds  Medication Sig  . amLODipine (NORVASC) 2.5 MG tablet Take 1 tablet (2.5 mg total) by mouth daily.  Marland Kitchen aspirin EC 81 MG tablet Take 1 tablet (81 mg total) by mouth daily.  . sotalol (BETAPACE) 120 MG tablet Take 1 tablet (120 mg total) by mouth daily.    Allergies: Amiodarone and Lyrica [pregabalin]  Social History   Socioeconomic History  . Marital status: Married    Spouse name: Not on file  . Number of children: Not on file  . Years of education: Not on file  . Highest education level: Not on file  Occupational History  . Occupation: retired  Engineer, production  . Financial resource strain: Not hard at all  . Food insecurity:    Worry: Never true    Inability: Never true  . Transportation needs:    Medical: No    Non-medical: No  Tobacco Use  . Smoking status: Never Smoker  . Smokeless tobacco: Never Used  Substance and Sexual Activity  . Alcohol use: No  . Drug use: No  . Sexual activity: Never  Lifestyle  . Physical activity:    Days per week: 0 days    Minutes per session: 0 min  . Stress: Not at all  Relationships  . Social connections:    Talks on phone: More than three times a week    Gets together: More than three times a week    Attends religious service: More than 4 times per  year    Active member of club or organization: No    Attends meetings of clubs or organizations: Never    Relationship status: Married  . Intimate partner violence:    Fear of current or ex partner: No    Emotionally abused: No    Physically abused: No    Forced sexual activity: No  Other Topics Concern  . Not on file  Social History Narrative  . Not on file    Family History  Problem Relation Age of Onset  . Hypertension Mother   . Stroke Mother   . Heart disease Father   . Heart attack Father   . Dementia Sister   . Retinoblastoma Son   . COPD Neg Hx   . Cancer Neg Hx   . Diabetes Neg Hx     Review of Systems: A 12-system review of systems was performed and was negative except as noted in the HPI.  --------------------------------------------------------------------------------------------------  Physical Exam: BP 120/66 (BP Location: Left Arm, Patient Position: Sitting, Cuff Size: Normal)   Pulse 66   Ht 5\' 11"  (1.803 m)   Wt 192 lb (87.1 kg)   BMI 26.78 kg/m   General: No acute distress HEENT: No conjunctival pallor or scleral icterus. Moist mucous membranes.  OP clear. Neck: Supple without lymphadenopathy, thyromegaly, JVD, or HJR.  Lungs: Normal work of breathing. Clear to auscultation bilaterally without wheezes or crackles. Heart: Regular rate and rhythm with  1/6 systolic murmur.  No rubs or gallops. Non-displaced PMI. Abd: Bowel sounds present. Soft, NT/ND without hepatosplenomegaly Ext: No lower extremity edema. Radial, PT, and DP pulses are 2+ bilaterally. Skin: Warm and dry without rash.  EKG: AV paced with occasional PACs.  Lab Results  Component Value Date   WBC 8.6 04/27/2017   HGB 13.6 04/27/2017   HCT 39.9 (L) 04/27/2017   MCV 94.4 04/27/2017   PLT 211 04/27/2017    Lab Results  Component Value Date   NA 141 07/04/2017   K 5.2 07/04/2017   CL 102 07/04/2017   CO2 23 07/04/2017   BUN 24 07/04/2017   CREATININE 1.49 (H) 07/04/2017    GLUCOSE 110 (H) 07/04/2017   ALT 15 07/30/2016    Lab Results  Component Value Date   CHOL 145 04/26/2017   HDL 35 (L) 04/26/2017   LDLCALC 94 04/26/2017   TRIG 81 04/26/2017   CHOLHDL 4.1 04/26/2017    --------------------------------------------------------------------------------------------------  ASSESSMENT AND PLAN: Chronic systolic heart failure due to nonischemic cardiomyopathy and valvular heart disease Roy Palmer appears euvolemic and well compensated with NYHA class II heart failure.  Medical therapy is quite limited at this time due to intolerance to numerous medications including ACE inhibitors, angiotensin receptor blockers, Entresto, and spironolactone.  He is on sotalol for antiarrhythmic therapy with history of sustained VT.  I do not wish to challenge him with a beta-blocker on top of this.  We have agreed to not make any medication changes at this time given that he has been doing well clinically.  I will touch base with Dr. Ladona Ridgel, as it appears that Roy Palmer is RV paced greater than 80% of the time.  Given his severely reduced LVEF (25-30% by echo in 04/2017), I wonder if lengthening his AV delay or considering upgrade to biventricular device may be beneficial.  Ventricular tachycardia No symptoms, though for episodes of VT noted on most recent device interrogation.  These were successfully treated with ATP; he has not required any ICD shocks recently.  He should continue with sotalol under the direction of Dr. Ladona Ridgel.  Hypertension Blood pressure adequately controlled.  Continue current dose of amlodipine, as Roy Palmer has been intolerant of numerous other agents.  Follow-up: Return to clinic in 3 months.  [name], MD 08/21/2017 7:42 PM

## 2017-09-09 ENCOUNTER — Ambulatory Visit (INDEPENDENT_AMBULATORY_CARE_PROVIDER_SITE_OTHER): Payer: Medicare Other | Admitting: Internal Medicine

## 2017-09-09 ENCOUNTER — Encounter: Payer: Self-pay | Admitting: Internal Medicine

## 2017-09-09 VITALS — BP 134/76 | HR 81 | Ht 71.0 in | Wt 195.6 lb

## 2017-09-09 DIAGNOSIS — I5022 Chronic systolic (congestive) heart failure: Secondary | ICD-10-CM | POA: Diagnosis not present

## 2017-09-09 DIAGNOSIS — Z95810 Presence of automatic (implantable) cardiac defibrillator: Secondary | ICD-10-CM

## 2017-09-09 DIAGNOSIS — I472 Ventricular tachycardia, unspecified: Secondary | ICD-10-CM

## 2017-09-09 NOTE — Progress Notes (Signed)
HPI Mr. Nurse returns today to discuss upgrade to a BiV ICD. He is a pleasant 84 yo man who has undergone mitral valve surgery complicated by the development of VT. He has been treated with sotalol with mostly good results. He still has episodes of dizziness which could be PVC's but not obviously any sustained arrhythmias. He has developed CHB since his heart surgery. His CHF symptoms are class 2 at most. He denies chest pain or sob. No edema.  No ICD shock since his last visit.  Allergies  Allergen Reactions  . Amiodarone Other (See Comments)    Terrible dreams/nightmares  . Lyrica [Pregabalin] Nausea And Vomiting     Current Outpatient Medications  Medication Sig Dispense Refill  . amLODipine (NORVASC) 2.5 MG tablet Take 1 tablet (2.5 mg total) by mouth daily. 90 tablet 2  . aspirin EC 81 MG tablet Take 1 tablet (81 mg total) by mouth daily.    . sotalol (BETAPACE) 120 MG tablet Take 1 tablet (120 mg total) by mouth daily. 90 tablet 3   No current facility-administered medications for this visit.      Past Medical History:  Diagnosis Date  . AAA (abdominal aortic aneurysm) without rupture (HCC) 03/12/2016   Small - measured 3.3 x 3.5 cm by CTA  . CKD (chronic kidney disease) stage 3, GFR 30-59 ml/min (HCC)   . Essential hypertension   . Hyperlipidemia   . Hypertensive kidney disease   . Incidental pulmonary nodule 03/12/2016   Vague opacity right lung base requires radiographic follow up - index of suspicion is LOW  . Near syncope    Cardiogenic, related to an SVT and severe MR  . Non-sustained ventricular tachycardia (HCC)   . S/P minimally invasive mitral valve repair 03/19/2016   Complex valvuloplasty including triangular resection of flail segment of posterior leaflet, chordal transposition x1, artificial Gore-tex neochord placement x4 and 34 mm Sorin Memo 3D ring annuloplasty via right mini thoracotomy approach with clipping of LA appendage  . Severe mitral  regurgitation by prior echocardiogram 02/23/2016   Confirmed by TEE  . Syncope 03/15/2016  . Thoracic aortic aneurysm (HCC) 03/12/2016   Very very mild fusiform enlargement of distal transverse and proximal descending thoracic aorta noted on CTA  . Trigeminal neuralgia     ROS:   All systems reviewed and negative except as noted in the HPI.   Past Surgical History:  Procedure Laterality Date  . CARDIAC CATHETERIZATION N/A 02/23/2016   Procedure: Right/Left Heart Cath and Coronary Angiography;  Surgeon: [name], MD;  Location: [facility] INVASIVE CV LAB;  Service: Cardiovascular: Angiographic minimal coronary disease. Normal left and right heart Pressures. Decreased CO/CI in settng of frequent PVCs & Severe MR.  Marland Kitchen CATARACT EXTRACTION, BILATERAL    . COLONOSCOPY  08/2005  . CYST EXCISION  10/2013   from neck  . EP IMPLANTABLE DEVICE N/A 03/27/2016   Procedure: ICD Implant Dual Chamber;  Surgeon: Marinus Maw, MD;  Location: [facility] INVASIVE CV LAB;  Service: Cardiovascular;  Laterality: N/A;  . ESOPHAGOGASTRODUODENOSCOPY  02/2010  . MITRAL VALVE REPAIR Right 03/19/2016   Procedure: MINIMALLY INVASIVE MITRAL VALVE REPAIR (MVR);  Surgeon: [name], MD;  Location: [facility] OR;  Service: Open Heart Surgery;  Laterality: Right;  . TEE WITHOUT CARDIOVERSION N/A 02/23/2016   Procedure: TRANSESOPHAGEAL ECHOCARDIOGRAM (TEE);  Surgeon: Quintella Reichert, MD;  Location: [facility] ENDOSCOPY;  Service: Cardiovascular: Normal LV size and function.  Degenerative mitral valve disease with  failed posterior leaflet (P2 segment), Severe MR with pulmonary vein systolic flow reversal. Moderate-TR.    . TEE WITHOUT CARDIOVERSION N/A 03/19/2016   Procedure: TRANSESOPHAGEAL ECHOCARDIOGRAM (TEE);  Surgeon: [name], MD;  Location: [facility] OR;  Service: Open Heart Surgery;  Laterality: N/A;  . TRANSTHORACIC ECHOCARDIOGRAM  02/14/2016   EF 50-55% with moderate LVH. Possible inferolateral hypokinesis. Moderate-severe MR with  posterior leaflet prolapse, cannot rule out flail. Severe LA dilation. Moderate TR.     Family History  Problem Relation Age of Onset  . Hypertension Mother   . Stroke Mother   . Heart disease Father   . Heart attack Father   . Dementia Sister   . Retinoblastoma Son   . COPD Neg Hx   . Cancer Neg Hx   . Diabetes Neg Hx      Social History   Socioeconomic History  . Marital status: Married    Spouse name: Not on file  . Number of children: Not on file  . Years of education: Not on file  . Highest education level: Not on file  Occupational History  . Occupation: retired  Engineer, production  . Financial resource strain: Not hard at all  . Food insecurity:    Worry: Never true    Inability: Never true  . Transportation needs:    Medical: No    Non-medical: No  Tobacco Use  . Smoking status: Never Smoker  . Smokeless tobacco: Never Used  Substance and Sexual Activity  . Alcohol use: No  . Drug use: No  . Sexual activity: Never  Lifestyle  . Physical activity:    Days per week: 0 days    Minutes per session: 0 min  . Stress: Not at all  Relationships  . Social connections:    Talks on phone: More than three times a week    Gets together: More than three times a week    Attends religious service: More than 4 times per year    Active member of club or organization: No    Attends meetings of clubs or organizations: Never    Relationship status: Married  . Intimate partner violence:    Fear of current or ex partner: No    Emotionally abused: No    Physically abused: No    Forced sexual activity: No  Other Topics Concern  . Not on file  Social History Narrative  . Not on file     BP 134/76   Pulse 81   Ht 5\' 11"  (1.803 m)   Wt 195 lb 9.6 oz (88.7 kg)   SpO2 92%   BMI 27.28 kg/m   Physical Exam:  Well appearing elderly man, NAD HEENT: Unremarkable Neck:  6 cm JVD, no thyromegally Lymphatics:  No adenopathy Back:  No CVA tenderness Lungs:  Clear with no  wheezes HEART:  Regular rate rhythm, no murmurs, no rubs, no clicks Abd:  soft, positive bowel sounds, no organomegally, no rebound, no guarding Ext:  2 plus pulses, no edema, no cyanosis, no clubbing Skin:  No rashes no nodules Neuro:  CN II through XII intact, motor grossly intact  EKG - NSR with AV pacing  DEVICE  Not interrogated today  Assess/Plan: 1. VT - he is mostly well controlled on sotalol.  2. Dizziness - the etiology is unclear. He is likely having PVC's. We cannot uptitrate his sotalol. 3. ICD - his device is working normally 4. CHB - unfortunately he has no conduction on his  own. He will require ongoing ventricular pacing or upgrade to a biv device requiring another surgery. Because he plays down his symptoms of CHF at least to me, I would recommend watchful waiting. He will not undergo a revision unless his CHF worsens.  Leonia Reeves.D.

## 2017-09-09 NOTE — Patient Instructions (Addendum)
Medication Instructions:  Your physician recommends that you continue on your current medications as directed. Please refer to the Current Medication list given to you today.  Labwork: None ordered.  Testing/Procedures: None ordered.  Follow-Up: Your physician wants you to follow-up in: January 14, 2018 @ 2:15 pm with Dr. Ladona Ridgel.     Remote monitoring is used to monitor your ICD from home. This monitoring reduces the number of office visits required to check your device to one time per year. It allows [place] to keep an eye on the functioning of your device to ensure it is working properly. You are scheduled for a device check from home on 09/22/2017. You may send your transmission at any time that day. If you have a wireless device, the transmission will be sent automatically. After your physician reviews your transmission, you will receive a postcard with your next transmission date.  Any Other Special Instructions Will Be Listed Below (If Applicable).  If you need a refill on your cardiac medications before your next appointment, please call your pharmacy.

## 2017-09-09 NOTE — Progress Notes (Signed)
ekg 

## 2017-09-15 ENCOUNTER — Telehealth: Payer: Self-pay | Admitting: Cardiology

## 2017-09-15 NOTE — Telephone Encounter (Signed)
New message  Pt verbalzied that he is calling for RN  He wants to try a new medication

## 2017-09-18 NOTE — Telephone Encounter (Signed)
Returned call to Pt wife.  Per wife she had appt with Dr. Okey Dupre yesterday and brought her husband with her.  Rediscussed with Dr. Okey Dupre that Pt still feels bad, dizzy.   Discussed with Dr. Okey Dupre.  Dr. Okey Dupre recommends readdressing with Dr. Ladona Ridgel. Pt has f/u with App next week, will discuss with Dr. Ladona Ridgel and follow up. Will cont to monitor.

## 2017-09-22 ENCOUNTER — Ambulatory Visit (INDEPENDENT_AMBULATORY_CARE_PROVIDER_SITE_OTHER): Payer: Medicare Other | Admitting: *Deleted

## 2017-09-22 ENCOUNTER — Telehealth: Payer: Self-pay

## 2017-09-22 DIAGNOSIS — I5022 Chronic systolic (congestive) heart failure: Secondary | ICD-10-CM

## 2017-09-22 DIAGNOSIS — I428 Other cardiomyopathies: Secondary | ICD-10-CM

## 2017-09-22 NOTE — Progress Notes (Signed)
Remote ICD transmission.   

## 2017-09-22 NOTE — Telephone Encounter (Signed)
LVM for pt to call device clinic back regarding ATP episodes over the weekend.

## 2017-09-22 NOTE — Telephone Encounter (Signed)
Spoke with pt regarding ATP episodes pt stated that he starts feeling dizzy like he is going to pass out, but doesn't and he knows the device is treating the VT. Pt reports compliance with Sotalol 120mg  daily. Roy Palmer wants to know if there is anything else he can try medication wise to treat this because he is dizzy more of the day than not with these episodes.  Informed pt that I would send this information over to Dr. Ladona Ridgel for further recommendations.    Roy Palmer has had 238 episodes of VT treated with ATP since 07/31/17, v-rates are in the 170's-180's

## 2017-09-23 ENCOUNTER — Encounter: Payer: Self-pay | Admitting: Cardiology

## 2017-09-23 ENCOUNTER — Telehealth: Payer: Self-pay

## 2017-09-23 LAB — CUP PACEART REMOTE DEVICE CHECK
Battery Remaining Longevity: 107 mo
Battery Voltage: 3.01 V
Brady Statistic AP VP Percent: 64.13 %
Brady Statistic AP VS Percent: 0.11 %
Brady Statistic AS VP Percent: 35.41 %
Brady Statistic AS VS Percent: 0.35 %
Brady Statistic RA Percent Paced: 63.2 %
Brady Statistic RV Percent Paced: 98.13 %
Date Time Interrogation Session: 20190422083623
HighPow Impedance: 69 Ohm
Implantable Lead Implant Date: 20171025
Implantable Lead Implant Date: 20171025
Implantable Lead Location: 753859
Implantable Lead Location: 753860
Implantable Lead Model: 5076
Implantable Pulse Generator Implant Date: 20171025
Lead Channel Impedance Value: 418 Ohm
Lead Channel Impedance Value: 475 Ohm
Lead Channel Impedance Value: 475 Ohm
Lead Channel Pacing Threshold Amplitude: 0.5 V
Lead Channel Pacing Threshold Amplitude: 0.75 V
Lead Channel Pacing Threshold Pulse Width: 0.4 ms
Lead Channel Pacing Threshold Pulse Width: 0.4 ms
Lead Channel Sensing Intrinsic Amplitude: 28.25 mV
Lead Channel Sensing Intrinsic Amplitude: 28.25 mV
Lead Channel Sensing Intrinsic Amplitude: 3.375 mV
Lead Channel Sensing Intrinsic Amplitude: 3.375 mV
Lead Channel Setting Pacing Amplitude: 2 V
Lead Channel Setting Pacing Amplitude: 2.5 V
Lead Channel Setting Pacing Pulse Width: 0.4 ms
Lead Channel Setting Sensing Sensitivity: 0.3 mV

## 2017-09-23 NOTE — Telephone Encounter (Signed)
Making appt d/t Pt with frequent episodes of VT treated with ATP per device.

## 2017-09-23 NOTE — Telephone Encounter (Signed)
Call placed to Pt.  Pt notified of appt to see Dr. Ladona Ridgel September 26, 2017 @ 3:00 pm.  Pt indicates understanding. Cancelled appt with Eula Listen on September 25, 2017 d/t Pt with VT.

## 2017-09-25 ENCOUNTER — Ambulatory Visit: Payer: Medicare Other | Admitting: Physician Assistant

## 2017-09-26 ENCOUNTER — Encounter: Payer: Self-pay | Admitting: Internal Medicine

## 2017-09-26 ENCOUNTER — Ambulatory Visit (INDEPENDENT_AMBULATORY_CARE_PROVIDER_SITE_OTHER): Payer: Medicare Other | Admitting: Internal Medicine

## 2017-09-26 VITALS — BP 142/86 | HR 74 | Ht 71.0 in | Wt 193.0 lb

## 2017-09-26 DIAGNOSIS — I472 Ventricular tachycardia, unspecified: Secondary | ICD-10-CM

## 2017-09-26 DIAGNOSIS — I428 Other cardiomyopathies: Secondary | ICD-10-CM | POA: Diagnosis not present

## 2017-09-26 DIAGNOSIS — I5022 Chronic systolic (congestive) heart failure: Secondary | ICD-10-CM | POA: Diagnosis not present

## 2017-09-26 DIAGNOSIS — Z95810 Presence of automatic (implantable) cardiac defibrillator: Secondary | ICD-10-CM | POA: Diagnosis not present

## 2017-09-26 MED ORDER — AMIODARONE HCL 200 MG PO TABS: 400.0000 mg | ORAL_TABLET | Freq: Every day | ORAL | 0 refills | Status: DC

## 2017-09-26 NOTE — Patient Instructions (Addendum)
Medication Instructions:  Your physician has recommended you make the following change in your medication:  1.  Stop taking sotalol today 2.  Tomorrow 09/27/2017 start taking amiodarone 200 mg two tablets by mouth daily.  Labwork: None ordered.  Testing/Procedures: None ordered.  Follow-Up: Your physician wants you to follow-up in:  6 weeks with Dr. Ladona Ridgel.    Remote monitoring is used to monitor your ICD from home. This monitoring reduces the number of office visits required to check your device to one time per year. It allows [place] to keep an eye on the functioning of your device to ensure it is working properly. You are scheduled for a device check from home on 12/22/2017. You may send your transmission at any time that day. If you have a wireless device, the transmission will be sent automatically. After your physician reviews your transmission, you will receive a postcard with your next transmission date.  Any Other Special Instructions Will Be Listed Below (If Applicable).  If you need a refill on your cardiac medications before your next appointment, please call your pharmacy.   Amiodarone tablets What is this medicine? AMIODARONE (a MEE oh da rone) is an antiarrhythmic drug. It helps make your heart beat regularly. Because of the side effects caused by this medicine, it is only used when other medicines have not worked. It is usually used for heartbeat problems that may be life threatening. This medicine may be used for other purposes; ask your health care provider or pharmacist if you have questions. COMMON BRAND NAME(S): Cordarone, Pacerone What should I tell my health care provider before I take this medicine? They need to know if you have any of these conditions: -liver disease -lung disease -other heart problems -thyroid disease -an unusual or allergic reaction to amiodarone, iodine, other medicines, foods, dyes, or preservatives -pregnant or trying to get  pregnant -breast-feeding How should I use this medicine? Take this medicine by mouth with a glass of water. Follow the directions on the prescription label. You can take this medicine with or without food. However, you should always take it the same way each time. Take your doses at regular intervals. Do not take your medicine more often than directed. Do not stop taking except on the advice of your doctor or health care professional. A special MedGuide will be given to you by the pharmacist with each prescription and refill. Be sure to read this information carefully each time. Talk to your pediatrician regarding the use of this medicine in children. Special care may be needed. Overdosage: If you think you have taken too much of this medicine contact a poison control center or emergency room at once. NOTE: This medicine is only for you. Do not share this medicine with others. What if I miss a dose? If you miss a dose, take it as soon as you can. If it is almost time for your next dose, take only that dose. Do not take double or extra doses. What may interact with this medicine? Do not take this medicine with any of the following medications: -abarelix -apomorphine -arsenic trioxide -certain antibiotics like erythromycin, gemifloxacin, levofloxacin, pentamidine -certain medicines for depression like amoxapine, tricyclic antidepressants -certain medicines for fungal infections like fluconazole, itraconazole, ketoconazole, posaconazole, voriconazole -certain medicines for irregular heart beat like disopyramide, dofetilide, dronedarone, ibutilide, propafenone, sotalol -certain medicines for malaria like chloroquine, halofantrine -cisapride -droperidol -haloperidol -hawthorn -maprotiline -methadone -phenothiazines like chlorpromazine, mesoridazine, thioridazine -pimozide -ranolazine -red yeast rice -vardenafil -ziprasidone This medicine may also  interact with the following  medications: -antiviral medicines for HIV or AIDS -certain medicines for blood pressure, heart disease, irregular heart beat -certain medicines for cholesterol like atorvastatin, cerivastatin, lovastatin, simvastatin -certain medicines for hepatitis C like sofosbuvir and ledipasvir; sofosbuvir -certain medicines for seizures like phenytoin -certain medicines for thyroid problems -certain medicines that treat or prevent blood clots like warfarin -cholestyramine -cimetidine -clopidogrel -cyclosporine -dextromethorphan -diuretics -fentanyl -general anesthetics -grapefruit juice -lidocaine -loratadine -methotrexate -other medicines that prolong the QT interval (cause an abnormal heart rhythm) -procainamide -quinidine -rifabutin, rifampin, or rifapentine -St. Lovis's Wort -trazodone This list may not describe all possible interactions. Give your health care provider a list of all the medicines, herbs, non-prescription drugs, or dietary supplements you use. Also tell them if you smoke, drink alcohol, or use illegal drugs. Some items may interact with your medicine. What should I watch for while using this medicine? Your condition will be monitored closely when you first begin therapy. Often, this drug is first started in a hospital or other monitored health care setting. Once you are on maintenance therapy, visit your doctor or health care professional for regular checks on your progress. Because your condition and use of this medicine carry some risk, it is a good idea to carry an identification card, necklace or bracelet with details of your condition, medications, and doctor or health care professional. Bonita Quin may get drowsy or dizzy. Do not drive, use machinery, or do anything that needs mental alertness until you know how this medicine affects you. Do not stand or sit up quickly, especially if you are an older patient. This reduces the risk of dizzy or fainting spells. This medicine can make  you more sensitive to the sun. Keep out of the sun. If you cannot avoid being in the sun, wear protective clothing and use sunscreen. Do not use sun lamps or tanning beds/booths. You should have regular eye exams before and during treatment. Call your doctor if you have blurred vision, see halos, or your eyes become sensitive to light. Your eyes may get dry. It may be helpful to use a lubricating eye solution or artificial tears solution. If you are going to have surgery or a procedure that requires contrast dyes, tell your doctor or health care professional that you are taking this medicine. What side effects may I notice from receiving this medicine? Side effects that you should report to your doctor or health care professional as soon as possible: -allergic reactions like skin rash, itching or hives, swelling of the face, lips, or tongue -blue-gray coloring of the skin -blurred vision, seeing blue green halos, increased sensitivity of the eyes to light -breathing problems -chest pain -dark urine -fast, irregular heartbeat -feeling faint or light-headed -intolerance to heat or cold -nausea or vomiting -pain and swelling of the scrotum -pain, tingling, numbness in feet, hands -redness, blistering, peeling or loosening of the skin, including inside the mouth -spitting up blood -stomach pain -sweating -unusual or uncontrolled movements of body -unusually weak or tired -weight gain or loss -yellowing of the eyes or skin Side effects that usually do not require medical attention (report to your doctor or health care professional if they continue or are bothersome): -change in sex drive or performance -constipation -dizziness -headache -loss of appetite -trouble sleeping This list may not describe all possible side effects. Call your doctor for medical advice about side effects. You may report side effects to FDA at 1-800-FDA-1088. Where should I keep my medicine? Keep out of the reach  of children. Store at room temperature between 20 and 25 degrees C (68 and 77 degrees F). Protect from light. Keep container tightly closed. Throw away any unused medicine after the expiration date. NOTE: This sheet is a summary. It may not cover all possible information. If you have questions about this medicine, talk to your doctor, pharmacist, or health care provider.  2018 Elsevier/Gold Standard (2013-08-23 19:48:11)

## 2017-09-26 NOTE — Progress Notes (Signed)
HPI  Mr. Goetzke returns today to discuss upgrade to a BiV ICD. He is a pleasant 84 yo man who has undergone mitral valve surgery complicated by the development of VT. He has been treated with sotalol with mostly good results. He still has episodes of dizziness which could be PVC's but not obviously any sustained arrhythmias. He has developed CHB since his heart surgery. His CHF symptoms are class 2 at most. He denies chest pain or sob. No edema.  No ICD shock since his last visit.  Review of his last cardiac monitor demonstrates over 200 episodes of ventricular tachycardia treated with antitachycardia pacing therapy despite sotalol 120 mg daily.  His sotalol dose was adjusted for renal insufficiency.  The patient has been symptomatic with these episodes and wonders about switching to amiodarone.  Allergies  Allergen Reactions  . Amiodarone Other (See Comments)    Terrible dreams/nightmares  . Lyrica [Pregabalin] Nausea And Vomiting     Current Outpatient Medications  Medication Sig Dispense Refill  . amLODipine (NORVASC) 2.5 MG tablet Take 1 tablet (2.5 mg total) by mouth daily. 90 tablet 2  . aspirin EC 81 MG tablet Take 1 tablet (81 mg total) by mouth daily.    . sotalol (BETAPACE) 120 MG tablet Take 1 tablet (120 mg total) by mouth daily. 90 tablet 3   No current facility-administered medications for this visit.      Past Medical History:  Diagnosis Date  . AAA (abdominal aortic aneurysm) without rupture (HCC) 03/12/2016   Small - measured 3.3 x 3.5 cm by CTA  . CKD (chronic kidney disease) stage 3, GFR 30-59 ml/min (HCC)   . Essential hypertension   . Hyperlipidemia   . Hypertensive kidney disease   . Incidental pulmonary nodule 03/12/2016   Vague opacity right lung base requires radiographic follow up - index of suspicion is LOW  . Near syncope    Cardiogenic, related to an SVT and severe MR  . Non-sustained ventricular tachycardia (HCC)   . S/P minimally invasive  mitral valve repair 03/19/2016   Complex valvuloplasty including triangular resection of flail segment of posterior leaflet, chordal transposition x1, artificial Gore-tex neochord placement x4 and 34 mm Sorin Memo 3D ring annuloplasty via right mini thoracotomy approach with clipping of LA appendage  . Severe mitral regurgitation by prior echocardiogram 02/23/2016   Confirmed by TEE  . Syncope 03/15/2016  . Thoracic aortic aneurysm (HCC) 03/12/2016   Very very mild fusiform enlargement of distal transverse and proximal descending thoracic aorta noted on CTA  . Trigeminal neuralgia     ROS:   All systems reviewed and negative except as noted in the HPI.   Past Surgical History:  Procedure Laterality Date  . CARDIAC CATHETERIZATION N/A 02/23/2016   Procedure: Right/Left Heart Cath and Coronary Angiography;  Surgeon: [name], MD;  Location: [facility] INVASIVE CV LAB;  Service: Cardiovascular: Angiographic minimal coronary disease. Normal left and right heart Pressures. Decreased CO/CI in settng of frequent PVCs & Severe MR.  Marland Kitchen CATARACT EXTRACTION, BILATERAL    . COLONOSCOPY  08/2005  . CYST EXCISION  10/2013   from neck  . EP IMPLANTABLE DEVICE N/A 03/27/2016   Procedure: ICD Implant Dual Chamber;  Surgeon: Marinus Maw, MD;  Location: [facility] INVASIVE CV LAB;  Service: Cardiovascular;  Laterality: N/A;  . ESOPHAGOGASTRODUODENOSCOPY  02/2010  . MITRAL VALVE REPAIR Right 03/19/2016   Procedure: MINIMALLY INVASIVE MITRAL VALVE REPAIR (MVR);  Surgeon: [name], MD;  Location: [facility] OR;  Service: Open Heart Surgery;  Laterality: Right;  . TEE WITHOUT CARDIOVERSION N/A 02/23/2016   Procedure: TRANSESOPHAGEAL ECHOCARDIOGRAM (TEE);  Surgeon: Quintella Reichert, MD;  Location: [facility] ENDOSCOPY;  Service: Cardiovascular: Normal LV size and function.  Degenerative mitral valve disease with failed posterior leaflet (P2 segment), Severe MR with pulmonary vein systolic flow reversal. Moderate-TR.    . TEE  WITHOUT CARDIOVERSION N/A 03/19/2016   Procedure: TRANSESOPHAGEAL ECHOCARDIOGRAM (TEE);  Surgeon: [name], MD;  Location: [facility] OR;  Service: Open Heart Surgery;  Laterality: N/A;  . TRANSTHORACIC ECHOCARDIOGRAM  02/14/2016   EF 50-55% with moderate LVH. Possible inferolateral hypokinesis. Moderate-severe MR with posterior leaflet prolapse, cannot rule out flail. Severe LA dilation. Moderate TR.     Family History  Problem Relation Age of Onset  . Hypertension Mother   . Stroke Mother   . Heart disease Father   . Heart attack Father   . Dementia Sister   . Retinoblastoma Son   . COPD Neg Hx   . Cancer Neg Hx   . Diabetes Neg Hx      Social History   Socioeconomic History  . Marital status: Married    Spouse name: Not on file  . Number of children: Not on file  . Years of education: Not on file  . Highest education level: Not on file  Occupational History  . Occupation: retired  Engineer, production  . Financial resource strain: Not hard at all  . Food insecurity:    Worry: Never true    Inability: Never true  . Transportation needs:    Medical: No    Non-medical: No  Tobacco Use  . Smoking status: Never Smoker  . Smokeless tobacco: Never Used  Substance and Sexual Activity  . Alcohol use: No  . Drug use: No  . Sexual activity: Never  Lifestyle  . Physical activity:    Days per week: 0 days    Minutes per session: 0 min  . Stress: Not at all  Relationships  . Social connections:    Talks on phone: More than three times a week    Gets together: More than three times a week    Attends religious service: More than 4 times per year    Active member of club or organization: No    Attends meetings of clubs or organizations: Never    Relationship status: Married  . Intimate partner violence:    Fear of current or ex partner: No    Emotionally abused: No    Physically abused: No    Forced sexual activity: No  Other Topics Concern  . Not on file  Social History  Narrative  . Not on file     BP (!) 142/86   Pulse 74   Ht 5\' 11"  (1.803 m)   Wt 193 lb (87.5 kg)   SpO2 97%   BMI 26.92 kg/m   Physical Exam:  Well appearing elderly man, NAD HEENT: Unremarkable Neck: 6 cm JVD, no thyromegally Lymphatics:  No adenopathy Back:  No CVA tenderness Lungs:  Clear, with no wheezes, rales, or rhonchi HEART:  Regular rate rhythm, no murmurs, no rubs, no clicks Abd:  soft, positive bowel sounds, no organomegally, no rebound, no guarding Ext:  2 plus pulses, no edema, no cyanosis, no clubbing Skin:  No rashes no nodules Neuro:  CN II through XII intact, motor grossly intact  EKG -normal sinus rhythm with P synchronous ventricular pacing and frequent  PVCs  DEVICE  Normal device function.  See PaceArt for details.   Assess/Plan: 1.  Recurrent ventricular tachycardia -his sotalol is not preventing him from having more VT.  Fortunately he has been successfully paced terminated.  We discussed the treatment options in detail, and the patient prefers to switch to amiodarone, despite some difficulty taking this medication in the hospital.  The patient thinks that he was on multiple other medicines and that amiodarone was actually not the cause of his symptoms.  He will undergo careful initiation of this medication and watchful waiting. 2.  ICD -he has Medtronic dual-chamber ICD is working normally.  We will recheck in several months. 3.  Chronic systolic heart failure -the patient is class II to class III.  I suspect his ventricular tachycardia is contributing to his heart failure symptoms.  We discussed upgrade from a dual-chamber ICD to a biventricular ICD.  We will hold off on this for now, while we are trying to control the patient's ventricular arrhythmias by treating him with amiodarone instead of sotalol. 4.  Complete heart block -since his ICD was placed, the patient has developed complete heart block.  He has had worsening heart failure symptoms.  He may  require upgrade to a biventricular ICD as he currently has left bundle-branch induced right ventricular apical pacing.  Lewayne Bunting, MD

## 2017-10-08 ENCOUNTER — Telehealth: Payer: Self-pay | Admitting: Internal Medicine

## 2017-10-08 DIAGNOSIS — I472 Ventricular tachycardia, unspecified: Secondary | ICD-10-CM

## 2017-10-08 MED ORDER — AMIODARONE HCL 200 MG PO TABS: 200.0000 mg | ORAL_TABLET | Freq: Every day | ORAL | 3 refills | Status: DC

## 2017-10-08 NOTE — Telephone Encounter (Signed)
S/w patient. Saw Dr Ladona Ridgel on 4/26 and started Amiodarone 400 mg once a day. Says it has helped him tremendously. He does not have any problems with his heart. No palpitations or heart going fast. However, he's having issues at night sleeping. He will wake up sweating, jittery, and confused.  During the day he does not have any problems, it only occurs at night. Last BP was 144/76. He can't remember what his heart rate has been running. He thinks something similar happened to him when he took the amiodarone when in the hospital in the past. Thinks he would like to try taking a lower dose. Advised I will route to Dr Ladona Ridgel for advice.

## 2017-10-08 NOTE — Telephone Encounter (Signed)
Pt c/o medication issue:  1. Name of Medication: Amiodarone   2. How are you currently taking this medication (dosage and times per day)?  400 mg po q day   3. Are you having a reaction (difficulty breathing--STAT)? No   4. What is your medication issue?  Patient c/o not being able to sleep sweating and disoriented at night

## 2017-10-08 NOTE — Telephone Encounter (Signed)
Discussed with Dr. Ladona Ridgel. Advised Pt he could reduce dose to 1 tablet daily of amiodarone 200 mg. Advised Pt it might take a few days for dose change to effect s/s.  Advised Pt to call nurse next week if he is not feeling better. Pt indicates understanding.

## 2017-11-04 ENCOUNTER — Encounter: Payer: Self-pay | Admitting: Internal Medicine

## 2017-11-04 ENCOUNTER — Ambulatory Visit (INDEPENDENT_AMBULATORY_CARE_PROVIDER_SITE_OTHER): Payer: Medicare Other | Admitting: Internal Medicine

## 2017-11-04 VITALS — BP 136/72 | HR 81 | Ht 71.0 in | Wt 195.0 lb

## 2017-11-04 DIAGNOSIS — Z95810 Presence of automatic (implantable) cardiac defibrillator: Secondary | ICD-10-CM | POA: Diagnosis not present

## 2017-11-04 DIAGNOSIS — I5022 Chronic systolic (congestive) heart failure: Secondary | ICD-10-CM

## 2017-11-04 DIAGNOSIS — I472 Ventricular tachycardia, unspecified: Secondary | ICD-10-CM

## 2017-11-04 NOTE — Progress Notes (Signed)
HPI Mr. Hailes returns today for followup of VT and CHB, chronic systolic heart failure, and prior valve repair. He had nearly incessant VT which was minimally improved with sotalol. We switched him to amiodarone and he is better. He is down to 200 mg daily. His heart failure symptoms are class 2A. He denies chest pain, sob, or edema. No syncope or ICD shock.  Allergies  Allergen Reactions  . Amiodarone Other (See Comments)    Terrible dreams/nightmares in high dosing  . Lyrica [Pregabalin] Nausea And Vomiting     Current Outpatient Medications  Medication Sig Dispense Refill  . amiodarone (PACERONE) 200 MG tablet Take 1 tablet (200 mg total) by mouth daily. 90 tablet 3  . amLODipine (NORVASC) 2.5 MG tablet Take 1 tablet (2.5 mg total) by mouth daily. 90 tablet 2  . aspirin EC 81 MG tablet Take 1 tablet (81 mg total) by mouth daily.     No current facility-administered medications for this visit.      Past Medical History:  Diagnosis Date  . AAA (abdominal aortic aneurysm) without rupture (HCC) 03/12/2016   Small - measured 3.3 x 3.5 cm by CTA  . CKD (chronic kidney disease) stage 3, GFR 30-59 ml/min (HCC)   . Essential hypertension   . Hyperlipidemia   . Hypertensive kidney disease   . Incidental pulmonary nodule 03/12/2016   Vague opacity right lung base requires radiographic follow up - index of suspicion is LOW  . Near syncope    Cardiogenic, related to an SVT and severe MR  . Non-sustained ventricular tachycardia (HCC)   . S/P minimally invasive mitral valve repair 03/19/2016   Complex valvuloplasty including triangular resection of flail segment of posterior leaflet, chordal transposition x1, artificial Gore-tex neochord placement x4 and 34 mm Sorin Memo 3D ring annuloplasty via right mini thoracotomy approach with clipping of LA appendage  . Severe mitral regurgitation by prior echocardiogram 02/23/2016   Confirmed by TEE  . Syncope 03/15/2016  . Thoracic  aortic aneurysm (HCC) 03/12/2016   Very very mild fusiform enlargement of distal transverse and proximal descending thoracic aorta noted on CTA  . Trigeminal neuralgia     ROS:   All systems reviewed and negative except as noted in the HPI.   Past Surgical History:  Procedure Laterality Date  . CARDIAC CATHETERIZATION N/A 02/23/2016   Procedure: Right/Left Heart Cath and Coronary Angiography;  Surgeon: [name], MD;  Location: [facility] INVASIVE CV LAB;  Service: Cardiovascular: Angiographic minimal coronary disease. Normal left and right heart Pressures. Decreased CO/CI in settng of frequent PVCs & Severe MR.  Marland Kitchen CATARACT EXTRACTION, BILATERAL    . COLONOSCOPY  08/2005  . CYST EXCISION  10/2013   from neck  . EP IMPLANTABLE DEVICE N/A 03/27/2016   Procedure: ICD Implant Dual Chamber;  Surgeon: Marinus Maw, MD;  Location: [facility] INVASIVE CV LAB;  Service: Cardiovascular;  Laterality: N/A;  . ESOPHAGOGASTRODUODENOSCOPY  02/2010  . MITRAL VALVE REPAIR Right 03/19/2016   Procedure: MINIMALLY INVASIVE MITRAL VALVE REPAIR (MVR);  Surgeon: [name], MD;  Location: [facility] OR;  Service: Open Heart Surgery;  Laterality: Right;  . TEE WITHOUT CARDIOVERSION N/A 02/23/2016   Procedure: TRANSESOPHAGEAL ECHOCARDIOGRAM (TEE);  Surgeon: Quintella Reichert, MD;  Location: [facility] ENDOSCOPY;  Service: Cardiovascular: Normal LV size and function.  Degenerative mitral valve disease with failed posterior leaflet (P2 segment), Severe MR with pulmonary vein systolic flow reversal. Moderate-TR.    . TEE WITHOUT CARDIOVERSION  N/A 03/19/2016   Procedure: TRANSESOPHAGEAL ECHOCARDIOGRAM (TEE);  Surgeon: [name], MD;  Location: [facility] OR;  Service: Open Heart Surgery;  Laterality: N/A;  . TRANSTHORACIC ECHOCARDIOGRAM  02/14/2016   EF 50-55% with moderate LVH. Possible inferolateral hypokinesis. Moderate-severe MR with posterior leaflet prolapse, cannot rule out flail. Severe LA dilation. Moderate TR.     Family History   Problem Relation Age of Onset  . Hypertension Mother   . Stroke Mother   . Heart disease Father   . Heart attack Father   . Dementia Sister   . Retinoblastoma Son   . COPD Neg Hx   . Cancer Neg Hx   . Diabetes Neg Hx      Social History   Socioeconomic History  . Marital status: Married    Spouse name: Not on file  . Number of children: Not on file  . Years of education: Not on file  . Highest education level: Not on file  Occupational History  . Occupation: retired  Engineer, production  . Financial resource strain: Not hard at all  . Food insecurity:    Worry: Never true    Inability: Never true  . Transportation needs:    Medical: No    Non-medical: No  Tobacco Use  . Smoking status: Never Smoker  . Smokeless tobacco: Never Used  Substance and Sexual Activity  . Alcohol use: No  . Drug use: No  . Sexual activity: Never  Lifestyle  . Physical activity:    Days per week: 0 days    Minutes per session: 0 min  . Stress: Not at all  Relationships  . Social connections:    Talks on phone: More than three times a week    Gets together: More than three times a week    Attends religious service: More than 4 times per year    Active member of club or organization: No    Attends meetings of clubs or organizations: Never    Relationship status: Married  . Intimate partner violence:    Fear of current or ex partner: No    Emotionally abused: No    Physically abused: No    Forced sexual activity: No  Other Topics Concern  . Not on file  Social History Narrative  . Not on file     BP 136/72   Pulse 81   Ht 5\' 11"  (1.803 m)   Wt 195 lb (88.5 kg)   BMI 27.20 kg/m   Physical Exam:  Well appearing 84 yo man, NAD HEENT: Unremarkable Neck:  6 cm JVD, no thyromegally Lymphatics:  No adenopathy Back:  No CVA tenderness Lungs:  Clear with no wheezes HEART:  Regular rate rhythm, no murmurs, no rubs, no clicks Abd:  soft, positive bowel sounds, no organomegally, no  rebound, no guarding Ext:  2 plus pulses, no edema, no cyanosis, no clubbing Skin:  No rashes no nodules Neuro:  CN II through XII intact, motor grossly intact  EKG -  None today   DEVICE  Normal device function.  See PaceArt for details.   Assess/Plan: 1. VT - he is much improved on amiodarone. He will continue 200 mg daily. I might reduce his dose when I see him back in 4 months. 2. Chronic systolic heart failure - his symptoms are improved off of sotalol and without VT. He admits to dietary indiscretion and I have asked him to reduce his salt intake. 3. ICD - his device interrogation demonstrates  normal device function.  4. CHB - he is unfortunately RV pacing. However, with his advanced age and lack of symptoms, I will hold off on recommending a BiV upgrade at this point in time.  Leonia Reeves.D.

## 2017-11-04 NOTE — Patient Instructions (Addendum)
Medication Instructions:  Your physician recommends that you continue on your current medications as directed. Please refer to the Current Medication list given to you today.  Labwork: None ordered.  Testing/Procedures: None ordered.  Follow-Up: Your physician wants you to follow-up in: 4 months with Dr. Ladona Ridgel (October) Please cancel August appointment and reschedule for October.  Remote monitoring is used to monitor your ICD from home. This monitoring reduces the number of office visits required to check your device to one time per year. It allows [place] to keep an eye on the functioning of your device to ensure it is working properly. You are scheduled for a device check from home on 12/22/2017. You may send your transmission at any time that day. If you have a wireless device, the transmission will be sent automatically. After your physician reviews your transmission, you will receive a postcard with your next transmission date.  Any Other Special Instructions Will Be Listed Below (If Applicable).  If you need a refill on your cardiac medications before your next appointment, please call your pharmacy.

## 2017-11-05 LAB — CUP PACEART INCLINIC DEVICE CHECK
Battery Remaining Longevity: 105 mo
Battery Voltage: 2.99 V
Brady Statistic AP VP Percent: 46.06 %
Brady Statistic AP VS Percent: 0.08 %
Brady Statistic AS VP Percent: 53.44 %
Brady Statistic AS VS Percent: 0.42 %
Brady Statistic RA Percent Paced: 45.61 %
Brady Statistic RV Percent Paced: 98.52 %
Date Time Interrogation Session: 20190604175247
HighPow Impedance: 73 Ohm
Implantable Lead Implant Date: 20171025
Implantable Lead Implant Date: 20171025
Implantable Lead Location: 753859
Implantable Lead Location: 753860
Implantable Lead Model: 5076
Implantable Pulse Generator Implant Date: 20171025
Lead Channel Impedance Value: 399 Ohm
Lead Channel Impedance Value: 475 Ohm
Lead Channel Impedance Value: 513 Ohm
Lead Channel Pacing Threshold Amplitude: 0.75 V
Lead Channel Pacing Threshold Amplitude: 0.875 V
Lead Channel Pacing Threshold Pulse Width: 0.4 ms
Lead Channel Pacing Threshold Pulse Width: 0.4 ms
Lead Channel Sensing Intrinsic Amplitude: 2.875 mV
Lead Channel Sensing Intrinsic Amplitude: 3.375 mV
Lead Channel Sensing Intrinsic Amplitude: 5.5 mV
Lead Channel Sensing Intrinsic Amplitude: 5.5 mV
Lead Channel Setting Pacing Amplitude: 2 V
Lead Channel Setting Pacing Amplitude: 2.5 V
Lead Channel Setting Pacing Pulse Width: 0.4 ms
Lead Channel Setting Sensing Sensitivity: 0.3 mV

## 2017-11-26 ENCOUNTER — Encounter: Payer: Self-pay | Admitting: Internal Medicine

## 2017-11-26 ENCOUNTER — Ambulatory Visit (INDEPENDENT_AMBULATORY_CARE_PROVIDER_SITE_OTHER): Payer: Medicare Other | Admitting: Internal Medicine

## 2017-11-26 VITALS — BP 110/64 | HR 75 | Ht 71.0 in | Wt 197.5 lb

## 2017-11-26 DIAGNOSIS — Z9889 Other specified postprocedural states: Secondary | ICD-10-CM | POA: Diagnosis not present

## 2017-11-26 DIAGNOSIS — I472 Ventricular tachycardia, unspecified: Secondary | ICD-10-CM

## 2017-11-26 DIAGNOSIS — I10 Essential (primary) hypertension: Secondary | ICD-10-CM | POA: Diagnosis not present

## 2017-11-26 DIAGNOSIS — I5022 Chronic systolic (congestive) heart failure: Secondary | ICD-10-CM | POA: Diagnosis not present

## 2017-11-26 DIAGNOSIS — I428 Other cardiomyopathies: Secondary | ICD-10-CM

## 2017-11-26 DIAGNOSIS — I340 Nonrheumatic mitral (valve) insufficiency: Secondary | ICD-10-CM | POA: Diagnosis not present

## 2017-11-26 NOTE — Patient Instructions (Signed)
Medication Instructions:  Your physician recommends that you continue on your current medications as directed. Please refer to the Current Medication list given to you today.   Labwork: none  Testing/Procedures: none  Follow-Up: Your physician recommends that you schedule a follow-up appointment in: 2 MONTHS WITH DR END.  If you need a refill on your cardiac medications before your next appointment, please call your pharmacy.   

## 2017-11-26 NOTE — Progress Notes (Signed)
Follow-up Outpatient Visit Date: 11/26/2017  Primary Care Provider: Gabriel Cirri, NP [address] [place] [address]  Chief Complaint: Follow-up nonischemic cardiomyopathy and valvular heart disease  HPI:  Roy Palmer is a 84 y.o. year-old male with history of severe mitral regurgitation status post mitral valve repair (03/2016), ventricular tachycardia status post ICD (03/2016), nonischemic cardiomyopathy with chronic systolic heart failure, nonobstructive CAD, hypertension, hyperlipidemia,andchronic kidney disease III, who presents for follow-up of an ICM and valvular heart disease.  Today, Roy Palmer reports that he is feeling the best he has felt in months.  Since our last visit, he was seen by Dr. Ladona Ridgel due to frequent ventricular tachycardia noted on his device.  He was started on amiodarone, which he is tolerating well.  He denies chest pain, shortness of breath, palpitations, lightheadedness, orthopnea, PND, and edema.  He has not had any ICD shocks.  He notes that his weight is up about 5 pounds but attributes this to his diet.  He remains active at home, working frequently in his garden.  --------------------------------------------------------------------------------------------------  Cardiovascular History & Procedures: Cardiovascular Problems:  Severe mitral regurgitation status post repair (03/2016)  Chronic systolic heart failure secondary to nonischemic cardiomyopathy  Frequent ventricular ectopy with sustained ventricular tachycardia status post ICD  Nonobstructive coronary artery disease  Risk Factors:  Age, male gender, hypertension, and hyperlipidemia  Cath/PCI:  LHC/RHC (02/23/16): Mild, nonobstructive CAD (30% ostial LMCA, 30% ostial LAD, 30% ostial ramus, and minimal luminal irregularities of dominant LCx. Normal right heart filling pressures. Decreased Fick cardiac output (CO 3.6 L/m, CI 1.8 L/m/m) in setting of frequent PVCs and severe MR.  CV  Surgery:  Minimally invasive mitral valve repair (03/19/16, Dr. Cornelius Moras)  EP Procedures and Devices:  24-hour Holter monitor (02/27/16): Predominantly sinus rhythm with average heart rate is 63 bpm (range 49-91 bpm). Longest RR interval 1.6 seconds. Frequent PVCs noted (11% burden) including frequent couplets and bigeminal cycles. 90 runs of NSVT were noted lasting up to 9 beats the maximum rate of 134 bpm. Rare SVT and supraventricular ectopy noted.  Dual chamber ICD (03/27/16, [employer])  Non-Invasive Evaluation(s):  TTE (04/25/17): Mildly dilated LV with moderate LVH. LVEF 25-30% with diffuse hypokinesis.Aortic sclerosis. Moderately elevated transmitral gradient status post repair. Moderate mitral regurgitation. Mild to moderate left atrial enlargement. Mildly reduced RV contraction.  Limited TTE (02/11/17): LVEF 25/30% with probable akinesis of the inferolateral and inferior myocardium. Grade 2 diastolic dysfunction. Mild LA enlargement.  TTE (09/03/16): Moderately dilated left ventricle with LVEF less than 20% and global hypokinesis. Prior surgical repair of mitral valve is evident with moderate regurgitation. Left atrium is moderately dilated. RV size and function are normal. Moderate pulmonary hypertension noted with RVSP of 54 mmHg.  Limited TTE (03/22/16): Normal LV size with moderate LVH and LVEF of 40-45% with possible inferior hypokinesis and incoordinate septal motion. Mitral angioplasty ring in place with trivial MR. No significant mitral stenosis. Mild left atrial enlargement. Normal RV was moderately reduced systolic function. RVSP 41 mm or mercury. Elevated central venous pressure.  TTE (02/14/16): Normal LV size with moderate LVH. EF mildly reduced at 50% with possible inferolateral hypokinesis. Moderate to severe MR noted with prolapse/flail of the posterior leaflet. Severe left atrial enlargement. Moderate TR.  TEE (02/23/16): Normal LV size and wall thickness with lower normal  LVEF. Trivial AI. Severe, eccentric MR with reversal of pulmonary vein flow and flail posterior leaflet. No left atrial thrombus. Lipomatous hypertrophy of the septum. Mild to moderate TR.  Limited TTE (03/22/16):  Normal LV size with moderate LVH. LVEF 40-45% with possible inferior hypokinesis. Trivial MR with mild left atrial enlargement. Normal RV size with moderately reduced systolic function. Mild TR. No pericardial effusion.   Recent CV Pertinent Labs: Lab Results  Component Value Date   CHOL 145 04/26/2017   CHOL 188 02/14/2016   HDL 35 (L) 04/26/2017   HDL 36 (L) 02/14/2016   LDLCALC 94 04/26/2017   LDLCALC 127 (H) 02/14/2016   TRIG 81 04/26/2017   CHOLHDL 4.1 04/26/2017   INR 1.11 04/25/2017   BNP 805.0 (H) 09/12/2016   K 5.2 07/04/2017   MG 2.2 04/24/2017   BUN 24 07/04/2017   CREATININE 1.49 (H) 07/04/2017    Past medical and surgical history were reviewed and updated in EPIC.  Current Meds  Medication Sig  . amiodarone (PACERONE) 200 MG tablet Take 1 tablet (200 mg total) by mouth daily.  Marland Kitchen amLODipine (NORVASC) 2.5 MG tablet Take 1 tablet (2.5 mg total) by mouth daily.  Marland Kitchen aspirin EC 81 MG tablet Take 1 tablet (81 mg total) by mouth daily.    Allergies: Amiodarone and Lyrica [pregabalin]  Social History   Tobacco Use  . Smoking status: Never Smoker  . Smokeless tobacco: Never Used  Substance Use Topics  . Alcohol use: No  . Drug use: No    Family History  Problem Relation Age of Onset  . Hypertension Mother   . Stroke Mother   . Heart disease Father   . Heart attack Father   . Dementia Sister   . Retinoblastoma Son   . COPD Neg Hx   . Cancer Neg Hx   . Diabetes Neg Hx     Review of Systems: A 12-system review of systems was performed and was negative except as noted in the HPI.  --------------------------------------------------------------------------------------------------  Physical Exam: BP 110/64 (BP Location: Left Arm, Patient Position:  Sitting, Cuff Size: Normal)   Pulse 75   Ht 5\' 11"  (1.803 m)   Wt 197 lb 8 oz (89.6 kg)   BMI 27.55 kg/m   General: NAD. HEENT: No conjunctival pallor or scleral icterus. Moist mucous membranes.  OP clear. Neck: Supple without lymphadenopathy, thyromegaly, JVD, or HJR. Lungs: Normal work of breathing. Clear to auscultation bilaterally without wheezes or crackles. Heart: Regular rate and rhythm with 1/6 systolic murmur loudest at the left lower sternal border.  No rubs or gallops.  Nondisplaced PMI. Abd: Bowel sounds present. Soft, NT/ND without hepatosplenomegaly Ext: No lower extremity edema. Radial, PT, and DP pulses are 2+ bilaterally. Skin: Warm and dry without rash.  EKG (11/04/2017-personally reviewed): AV paced rhythm.  Lab Results  Component Value Date   WBC 8.6 04/27/2017   HGB 13.6 04/27/2017   HCT 39.9 (L) 04/27/2017   MCV 94.4 04/27/2017   PLT 211 04/27/2017    Lab Results  Component Value Date   NA 141 07/04/2017   K 5.2 07/04/2017   CL 102 07/04/2017   CO2 23 07/04/2017   BUN 24 07/04/2017   CREATININE 1.49 (H) 07/04/2017   GLUCOSE 110 (H) 07/04/2017   ALT 15 07/30/2016    Lab Results  Component Value Date   CHOL 145 04/26/2017   HDL 35 (L) 04/26/2017   LDLCALC 94 04/26/2017   TRIG 81 04/26/2017   CHOLHDL 4.1 04/26/2017    --------------------------------------------------------------------------------------------------  ASSESSMENT AND PLAN: Chronic systolic heart failure secondary to nonischemic cardiomyopathy Mr. Lusty appears euvolemic and well compensated with NYHA class I-II heart failure symptoms spite  his LVEF of 25 to 30%.  He has improved clinically with treatment of his frequent ventricular tachycardia by Dr. Ladona Ridgel.  Unfortunately, Mr. Barbato has been intolerant of multiple evidence-based heart failure medications, including beta blockers, ACE inhibitors, ARB's, and aldosterone antagonist in the past.  We discussed retrial of low-dose  bisoprolol, but Mr. Canedo would like to defer this for the time being.  We will readdress adding this in 2 months when he returns for follow-up.  Mitral regurgitation status post repair Mr. Vastine appears euvolemic on exam.  Continue low-dose aspirin and SBE prophylaxis for dental procedures.  He is scheduled for follow-up with Dr. Cornelius Moras this fall.  Hypertension Blood pressure well controlled today.  He is tolerating amlodipine 2.5 mg daily well.  No medication changes at this time.  Ventricular tachycardia No symptoms reported by patient.  He is tolerating amiodarone well under the direction of Dr. Ladona Ridgel.  Continue follow-up with Dr. Ladona Ridgel and the device clinic.  Follow-up: Return to clinic in 2 months.  [name], MD 11/26/2017 2:07 PM

## 2017-11-27 ENCOUNTER — Encounter: Payer: Self-pay | Admitting: Internal Medicine

## 2017-12-10 ENCOUNTER — Ambulatory Visit: Payer: Medicare Other | Admitting: Unknown Physician Specialty

## 2017-12-22 ENCOUNTER — Ambulatory Visit (INDEPENDENT_AMBULATORY_CARE_PROVIDER_SITE_OTHER): Payer: Medicare Other | Admitting: *Deleted

## 2017-12-22 DIAGNOSIS — I5022 Chronic systolic (congestive) heart failure: Secondary | ICD-10-CM

## 2017-12-22 DIAGNOSIS — I472 Ventricular tachycardia, unspecified: Secondary | ICD-10-CM

## 2017-12-22 DIAGNOSIS — I428 Other cardiomyopathies: Secondary | ICD-10-CM

## 2017-12-22 NOTE — Progress Notes (Signed)
Remote ICD transmission.   

## 2017-12-23 ENCOUNTER — Encounter: Payer: Self-pay | Admitting: Cardiology

## 2018-01-08 ENCOUNTER — Telehealth: Payer: Self-pay | Admitting: Internal Medicine

## 2018-01-08 ENCOUNTER — Other Ambulatory Visit: Payer: Self-pay

## 2018-01-08 DIAGNOSIS — I472 Ventricular tachycardia, unspecified: Secondary | ICD-10-CM

## 2018-01-08 MED ORDER — AMLODIPINE BESYLATE 2.5 MG PO TABS: 2.5000 mg | ORAL_TABLET | Freq: Every day | ORAL | 0 refills | Status: DC

## 2018-01-08 MED ORDER — AMIODARONE HCL 200 MG PO TABS: 200.0000 mg | ORAL_TABLET | Freq: Every day | ORAL | 0 refills | Status: DC

## 2018-01-08 NOTE — Telephone Encounter (Signed)
°*  STAT* If patient is at the pharmacy, call can be transferred to refill team.   1. Which medications need to be refilled? (please list name of each medication and dose if known) Amiodarone (PACERONE) 200 MG - 1 tablet daily amLODipine (NORVA [place]) 2.5 MG - 1 tablet daily  2. Which pharmacy/location (including street and city if local pharmacy) is medication to be sent to? [facility] in [place]  3. Do they need a 30 day or 90 day supply? 30 day

## 2018-01-08 NOTE — Telephone Encounter (Signed)
Requested Prescriptions   Signed Prescriptions Disp Refills  . amiodarone (PACERONE) 200 MG tablet 30 tablet 0    Sig: Take 1 tablet (200 mg total) by mouth daily.    Authorizing Provider: END, CHRISTOPHER    Ordering User: Margrett Rud amLODipine (NORVASC) 2.5 MG tablet 30 tablet 0    Sig: Take 1 tablet (2.5 mg total) by mouth daily.    Authorizing Provider: END, CHRISTOPHER    Ordering User: Margrett Rud

## 2018-01-08 NOTE — Telephone Encounter (Signed)
Requested Prescriptions   Signed Prescriptions Disp Refills  . amiodarone (PACERONE) 200 MG tablet 30 tablet 0    Sig: Take 1 tablet (200 mg total) by mouth daily.    Authorizing Provider: END, CHRISTOPHER    Ordering User: SLAYTON, BRITTANY N  . amLODipine (NORVASC) 2.5 MG tablet 30 tablet 0    Sig: Take 1 tablet (2.5 mg total) by mouth daily.    Authorizing Provider: END, CHRISTOPHER    Ordering User: SLAYTON, BRITTANY N    

## 2018-01-14 ENCOUNTER — Encounter: Payer: Medicare Other | Admitting: Internal Medicine

## 2018-01-17 LAB — CUP PACEART REMOTE DEVICE CHECK
Battery Remaining Longevity: 101 mo
Battery Voltage: 3 V
Brady Statistic AP VP Percent: 41 %
Brady Statistic AP VS Percent: 0.02 %
Brady Statistic AS VP Percent: 58.86 %
Brady Statistic AS VS Percent: 0.12 %
Brady Statistic RA Percent Paced: 40.93 %
Brady Statistic RV Percent Paced: 99.71 %
Date Time Interrogation Session: 20190722041804
HighPow Impedance: 75 Ohm
Implantable Lead Implant Date: 20171025
Implantable Lead Implant Date: 20171025
Implantable Lead Location: 753859
Implantable Lead Location: 753860
Implantable Lead Model: 5076
Implantable Pulse Generator Implant Date: 20171025
Lead Channel Impedance Value: 361 Ohm
Lead Channel Impedance Value: 456 Ohm
Lead Channel Impedance Value: 475 Ohm
Lead Channel Pacing Threshold Amplitude: 0.625 V
Lead Channel Pacing Threshold Amplitude: 0.75 V
Lead Channel Pacing Threshold Pulse Width: 0.4 ms
Lead Channel Pacing Threshold Pulse Width: 0.4 ms
Lead Channel Sensing Intrinsic Amplitude: 2.875 mV
Lead Channel Sensing Intrinsic Amplitude: 2.875 mV
Lead Channel Sensing Intrinsic Amplitude: 5.5 mV
Lead Channel Sensing Intrinsic Amplitude: 5.5 mV
Lead Channel Setting Pacing Amplitude: 2 V
Lead Channel Setting Pacing Amplitude: 2.5 V
Lead Channel Setting Pacing Pulse Width: 0.4 ms
Lead Channel Setting Sensing Sensitivity: 0.3 mV

## 2018-01-28 ENCOUNTER — Encounter: Payer: Self-pay | Admitting: Internal Medicine

## 2018-01-28 ENCOUNTER — Ambulatory Visit (INDEPENDENT_AMBULATORY_CARE_PROVIDER_SITE_OTHER): Payer: Medicare Other | Admitting: Internal Medicine

## 2018-01-28 VITALS — BP 130/74 | HR 63 | Ht 71.0 in | Wt 196.5 lb

## 2018-01-28 DIAGNOSIS — Z9889 Other specified postprocedural states: Secondary | ICD-10-CM

## 2018-01-28 DIAGNOSIS — I428 Other cardiomyopathies: Secondary | ICD-10-CM

## 2018-01-28 DIAGNOSIS — I472 Ventricular tachycardia, unspecified: Secondary | ICD-10-CM

## 2018-01-28 DIAGNOSIS — I340 Nonrheumatic mitral (valve) insufficiency: Secondary | ICD-10-CM | POA: Diagnosis not present

## 2018-01-28 DIAGNOSIS — I5022 Chronic systolic (congestive) heart failure: Secondary | ICD-10-CM | POA: Diagnosis not present

## 2018-01-28 DIAGNOSIS — I10 Essential (primary) hypertension: Secondary | ICD-10-CM

## 2018-01-28 NOTE — Patient Instructions (Signed)
Medication Instructions:  Your physician recommends that you continue on your current medications as directed. Please refer to the Current Medication list given to you today.   Labwork: none  Testing/Procedures: none  Follow-Up: Your physician recommends that you schedule a follow-up appointment in: December WITH DR END.  If you need a refill on your cardiac medications before your next appointment, please call your pharmacy.

## 2018-01-28 NOTE — Progress Notes (Signed)
Follow-up Outpatient Visit Date: 01/28/2018  Primary Care Provider: Gabriel Cirri, NP [address] [place] [address]  Chief Complaint: Follow-up cardiomyopathy and valvular heart disease  HPI:  Roy Palmer is a 84 y.o. year-old male with history of severe mitral regurgitation status post mitral valve repair (03/2016),ventricular tachycardia status post ICD (03/2016), nonischemic cardiomyopathy with chronic systolic heart failure, nonobstructive CAD, hypertension, hyperlipidemia,andchronic kidney diseaseIII, who presents for follow-up of heart failure and valvular heart disease.  I last saw Roy Palmer in late June, at which time he reported feeling very well after starting low-dose amiodarone to suppress frequent runs of ventricular tachycardia.  We discussed a trial of low-dose bisoprolol, but Roy Palmer wished to defer at that time.  He was not taking any evidence-based heart failure therapy secondary to intolerances to beta-blockers, ACE inhibitors, ARB's, and spironolactone.  Today, Roy Palmer reports feeling well.  He denies chest pain, shortness of breath, palpitations, lightheadedness, orthopnea, PND, and edema.  He is tolerating his current medications well.  He denies ICD shocks.  He remains active on his farm.  --------------------------------------------------------------------------------------------------  Cardiovascular History & Procedures: Cardiovascular Problems:  Severe mitral regurgitation status post repair (03/2016)  Chronic systolic heart failure secondary to nonischemic cardiomyopathy  Frequent ventricular ectopy with sustained ventricular tachycardia status post ICD  Nonobstructive coronary artery disease  Risk Factors:  Age, male gender, hypertension, and hyperlipidemia  Cath/PCI:  LHC/RHC (02/23/16): Mild, nonobstructive CAD (30% ostial LMCA, 30% ostial LAD, 30% ostial ramus, and minimal luminal irregularities of dominant LCx. Normal right heart  filling pressures. Decreased Fick cardiac output (CO 3.6 L/m, CI 1.8 L/m/m) in setting of frequent PVCs and severe MR.  CV Surgery:  Minimally invasive mitral valve repair (03/19/16, Dr. Cornelius Moras)  EP Procedures and Devices:  24-hour Holter monitor (02/27/16): Predominantly sinus rhythm with average heart rate is 63 bpm (range 49-91 bpm). Longest RR interval 1.6 seconds. Frequent PVCs noted (11% burden) including frequent couplets and bigeminal cycles. 90 runs of NSVT were noted lasting up to 9 beats the maximum rate of 134 bpm. Rare SVT and supraventricular ectopy noted.  Dual chamber ICD (03/27/16, [employer])  Non-Invasive Evaluation(s):  TTE (04/25/17): Mildly dilated LV with moderate LVH. LVEF 25-30% with diffuse hypokinesis.Aortic sclerosis. Moderately elevated transmitral gradient status post repair. Moderate mitral regurgitation. Mild to moderate left atrial enlargement. Mildly reduced RV contraction.  Limited TTE (02/11/17): LVEF 25/30% with probable akinesis of the inferolateral and inferior myocardium. Grade 2 diastolic dysfunction. Mild LA enlargement.  TTE (09/03/16): Moderately dilated left ventricle with LVEF less than 20% and global hypokinesis. Prior surgical repair of mitral valve is evident with moderate regurgitation. Left atrium is moderately dilated. RV size and function are normal. Moderate pulmonary hypertension noted with RVSP of 54 mmHg.  Limited TTE (03/22/16): Normal LV size with moderate LVH and LVEF of 40-45% with possible inferior hypokinesis and incoordinate septal motion. Mitral angioplasty ring in place with trivial MR. No significant mitral stenosis. Mild left atrial enlargement. Normal RV was moderately reduced systolic function. RVSP 41 mm or mercury. Elevated central venous pressure.  TTE (02/14/16): Normal LV size with moderate LVH. EF mildly reduced at 50% with possible inferolateral hypokinesis. Moderate to severe MR noted with prolapse/flail of the posterior  leaflet. Severe left atrial enlargement. Moderate TR.  TEE (02/23/16): Normal LV size and wall thickness with lower normal LVEF. Trivial AI. Severe, eccentric MR with reversal of pulmonary vein flow and flail posterior leaflet. No left atrial thrombus. Lipomatous hypertrophy of the septum. Mild to  moderate TR.  Limited TTE (03/22/16): Normal LV size with moderate LVH. LVEF 40-45% with possible inferior hypokinesis. Trivial MR with mild left atrial enlargement. Normal RV size with moderately reduced systolic function. Mild TR. No pericardial effusion.  Recent CV Pertinent Labs: Lab Results  Component Value Date   CHOL 145 04/26/2017   CHOL 188 02/14/2016   HDL 35 (L) 04/26/2017   HDL 36 (L) 02/14/2016   LDLCALC 94 04/26/2017   LDLCALC 127 (H) 02/14/2016   TRIG 81 04/26/2017   CHOLHDL 4.1 04/26/2017   INR 1.11 04/25/2017   BNP 805.0 (H) 09/12/2016   K 5.2 07/04/2017   MG 2.2 04/24/2017   BUN 24 07/04/2017   CREATININE 1.49 (H) 07/04/2017    Past medical and surgical history were reviewed and updated in EPIC.  Current Meds  Medication Sig  . amiodarone (PACERONE) 200 MG tablet Take 1 tablet (200 mg total) by mouth daily.  Marland Kitchen amLODipine (NORVASC) 2.5 MG tablet Take 1 tablet (2.5 mg total) by mouth daily.  Marland Kitchen aspirin EC 81 MG tablet Take 1 tablet (81 mg total) by mouth daily.    Allergies: Amiodarone and Lyrica [pregabalin]  Social History   Tobacco Use  . Smoking status: Never Smoker  . Smokeless tobacco: Never Used  Substance Use Topics  . Alcohol use: No  . Drug use: No    Family History  Problem Relation Age of Onset  . Hypertension Mother   . Stroke Mother   . Heart disease Father   . Heart attack Father   . Dementia Sister   . Retinoblastoma Son   . COPD Neg Hx   . Cancer Neg Hx   . Diabetes Neg Hx     Review of Systems: A 12-system review of systems was performed and was negative except as noted in the  HPI.  --------------------------------------------------------------------------------------------------  Physical Exam: BP 130/74 (BP Location: Left Arm, Patient Position: Sitting, Cuff Size: Normal)   Pulse 63   Ht 5\' 11"  (1.803 m)   Wt 196 lb 8 oz (89.1 kg)   BMI 27.41 kg/m   General:  NAD. HEENT: No conjunctival pallor or scleral icterus. Moist mucous membranes.  OP clear. Neck: Supple without lymphadenopathy, thyromegaly, JVD, or HJR. No carotid bruit. Lungs: Normal work of breathing. Clear to auscultation bilaterally without wheezes or crackles. Heart: Regular rate and rhythm with 1/6 systolic murmur at the LLSB.  No rubs or gallops. Non-displaced PMI. Abd: Bowel sounds present. Soft, NT/ND without hepatosplenomegaly Ext: No lower extremity edema. Radial, PT, and DP pulses are 2+ bilaterally. Skin: Warm and dry without rash.  EKG:  AV paced.  Lab Results  Component Value Date   WBC 8.6 04/27/2017   HGB 13.6 04/27/2017   HCT 39.9 (L) 04/27/2017   MCV 94.4 04/27/2017   PLT 211 04/27/2017    Lab Results  Component Value Date   NA 141 07/04/2017   K 5.2 07/04/2017   CL 102 07/04/2017   CO2 23 07/04/2017   BUN 24 07/04/2017   CREATININE 1.49 (H) 07/04/2017   GLUCOSE 110 (H) 07/04/2017   ALT 15 07/30/2016    Lab Results  Component Value Date   CHOL 145 04/26/2017   HDL 35 (L) 04/26/2017   LDLCALC 94 04/26/2017   TRIG 81 04/26/2017   CHOLHDL 4.1 04/26/2017    --------------------------------------------------------------------------------------------------  ASSESSMENT AND PLAN: Chronic systolic heart failure due to NICM Roy Palmer appears euvolemic and well-compensated with NYHA class II symptoms.  Given that  he continues to feel well, we will defer any medication changes today; he has been intolerant of all classes of evidence-based heart failure medications in the past.  Mitral regurgitation status post repair No worsening heart failure symptoms.   Continue ASA 81 mg daily and SBE prophylaxis before dental procedures.  Ventricular tachycardia Tolerating amiodarone well.  No syncope or device shocks since our last visit.  Continue current medications, including close follow-up with Dr. Ladona Ridgel (EP) and the device clinic.  Hypertension BP upper normal.  Continue current dose of amlodipine.  Follow-up: Return to clinic in 05/2018.  [name], MD 01/29/2018 8:55 PM

## 2018-02-04 ENCOUNTER — Encounter: Payer: Medicare Other | Admitting: Internal Medicine

## 2018-02-09 ENCOUNTER — Other Ambulatory Visit: Payer: Self-pay

## 2018-02-09 ENCOUNTER — Telehealth: Payer: Self-pay | Admitting: Internal Medicine

## 2018-02-09 DIAGNOSIS — I472 Ventricular tachycardia, unspecified: Secondary | ICD-10-CM

## 2018-02-09 MED ORDER — AMIODARONE HCL 200 MG PO TABS: 200.0000 mg | ORAL_TABLET | Freq: Every day | ORAL | 2 refills | Status: DC

## 2018-02-09 NOTE — Telephone Encounter (Signed)
°*  STAT* If patient is at the pharmacy, call can be transferred to refill team.   1. Which medications need to be refilled? (please list name of each medication and dose if known) Amiodarone 200 mg po q day   2. Which pharmacy/location (including street and city if local pharmacy) is medication to be sent to? [facility] [address]   3. Do they need a 30 day or 90 day supply? 90

## 2018-03-23 ENCOUNTER — Ambulatory Visit (INDEPENDENT_AMBULATORY_CARE_PROVIDER_SITE_OTHER): Payer: Medicare Other | Admitting: *Deleted

## 2018-03-23 DIAGNOSIS — I5022 Chronic systolic (congestive) heart failure: Secondary | ICD-10-CM | POA: Diagnosis not present

## 2018-03-23 DIAGNOSIS — I428 Other cardiomyopathies: Secondary | ICD-10-CM

## 2018-03-23 NOTE — Progress Notes (Signed)
Remote ICD transmission.   

## 2018-04-03 ENCOUNTER — Encounter: Payer: Self-pay | Admitting: Internal Medicine

## 2018-04-03 ENCOUNTER — Ambulatory Visit (INDEPENDENT_AMBULATORY_CARE_PROVIDER_SITE_OTHER): Payer: Medicare Other | Admitting: Internal Medicine

## 2018-04-03 VITALS — BP 132/64 | HR 78 | Ht 71.0 in | Wt 195.0 lb

## 2018-04-03 DIAGNOSIS — Z95810 Presence of automatic (implantable) cardiac defibrillator: Secondary | ICD-10-CM | POA: Diagnosis not present

## 2018-04-03 DIAGNOSIS — I472 Ventricular tachycardia, unspecified: Secondary | ICD-10-CM

## 2018-04-03 DIAGNOSIS — I5022 Chronic systolic (congestive) heart failure: Secondary | ICD-10-CM | POA: Diagnosis not present

## 2018-04-03 DIAGNOSIS — I428 Other cardiomyopathies: Secondary | ICD-10-CM

## 2018-04-03 LAB — CUP PACEART INCLINIC DEVICE CHECK
Battery Remaining Longevity: 95 mo
Battery Voltage: 2.99 V
Brady Statistic AP VP Percent: 53.16 %
Brady Statistic AP VS Percent: 0.02 %
Brady Statistic AS VP Percent: 46.75 %
Brady Statistic AS VS Percent: 0.07 %
Brady Statistic RA Percent Paced: 53.08 %
Brady Statistic RV Percent Paced: 99.8 %
Date Time Interrogation Session: 20191101175555
HighPow Impedance: 69 Ohm
Implantable Lead Implant Date: 20171025
Implantable Lead Implant Date: 20171025
Implantable Lead Location: 753859
Implantable Lead Location: 753860
Implantable Lead Model: 5076
Implantable Pulse Generator Implant Date: 20171025
Lead Channel Impedance Value: 361 Ohm
Lead Channel Impedance Value: 456 Ohm
Lead Channel Impedance Value: 456 Ohm
Lead Channel Pacing Threshold Amplitude: 0.75 V
Lead Channel Pacing Threshold Amplitude: 0.75 V
Lead Channel Pacing Threshold Pulse Width: 0.4 ms
Lead Channel Pacing Threshold Pulse Width: 0.4 ms
Lead Channel Sensing Intrinsic Amplitude: 2.875 mV
Lead Channel Setting Pacing Amplitude: 2 V
Lead Channel Setting Pacing Amplitude: 2.5 V
Lead Channel Setting Pacing Pulse Width: 0.4 ms
Lead Channel Setting Sensing Sensitivity: 0.3 mV

## 2018-04-03 MED ORDER — AMIODARONE HCL 200 MG PO TABS: ORAL_TABLET | ORAL | 3 refills | Status: DC

## 2018-04-03 NOTE — Progress Notes (Signed)
HPI Mr. Wecker returns today for followup of VT and CHB, chronic systolic heart failure, and prior valve repair. He had nearly incessant VT which was minimally improved with sotalol. We switched him to amiodarone and he is better. He is down to 200 mg daily. His heart failure symptoms are class 2A. Since I saw him last, He denies chest pain, sob, or edema. No syncope or ICD shock. He is fishing and has had no additional VT.  Allergies  Allergen Reactions  . Amiodarone Other (See Comments)    Terrible dreams/nightmares in high dosing  . Lyrica [Pregabalin] Nausea And Vomiting     Current Outpatient Medications  Medication Sig Dispense Refill  . amiodarone (PACERONE) 200 MG tablet Take 1 tablet (200 mg total) by mouth daily. 30 tablet 2  . amLODipine (NORVASC) 2.5 MG tablet Take 1 tablet (2.5 mg total) by mouth daily. 30 tablet 0  . aspirin EC 81 MG tablet Take 1 tablet (81 mg total) by mouth daily.     No current facility-administered medications for this visit.      Past Medical History:  Diagnosis Date  . AAA (abdominal aortic aneurysm) without rupture (HCC) 03/12/2016   Small - measured 3.3 x 3.5 cm by CTA  . CKD (chronic kidney disease) stage 3, GFR 30-59 ml/min (HCC)   . Essential hypertension   . Hyperlipidemia   . Hypertensive kidney disease   . Incidental pulmonary nodule 03/12/2016   Vague opacity right lung base requires radiographic follow up - index of suspicion is LOW  . Near syncope    Cardiogenic, related to an SVT and severe MR  . Non-sustained ventricular tachycardia (HCC)   . S/P minimally invasive mitral valve repair 03/19/2016   Complex valvuloplasty including triangular resection of flail segment of posterior leaflet, chordal transposition x1, artificial Gore-tex neochord placement x4 and 34 mm Sorin Memo 3D ring annuloplasty via right mini thoracotomy approach with clipping of LA appendage  . Severe mitral regurgitation by prior echocardiogram  02/23/2016   Confirmed by TEE  . Syncope 03/15/2016  . Thoracic aortic aneurysm (HCC) 03/12/2016   Very very mild fusiform enlargement of distal transverse and proximal descending thoracic aorta noted on CTA  . Trigeminal neuralgia     ROS:   All systems reviewed and negative except as noted in the HPI.   Past Surgical History:  Procedure Laterality Date  . CARDIAC CATHETERIZATION N/A 02/23/2016   Procedure: Right/Left Heart Cath and Coronary Angiography;  Surgeon: [name], MD;  Location: [facility] INVASIVE CV LAB;  Service: Cardiovascular: Angiographic minimal coronary disease. Normal left and right heart Pressures. Decreased CO/CI in settng of frequent PVCs & Severe MR.  Marland Kitchen CATARACT EXTRACTION, BILATERAL    . COLONOSCOPY  08/2005  . CYST EXCISION  10/2013   from neck  . EP IMPLANTABLE DEVICE N/A 03/27/2016   Procedure: ICD Implant Dual Chamber;  Surgeon: Marinus Maw, MD;  Location: [facility] INVASIVE CV LAB;  Service: Cardiovascular;  Laterality: N/A;  . ESOPHAGOGASTRODUODENOSCOPY  02/2010  . MITRAL VALVE REPAIR Right 03/19/2016   Procedure: MINIMALLY INVASIVE MITRAL VALVE REPAIR (MVR);  Surgeon: [name], MD;  Location: [facility] OR;  Service: Open Heart Surgery;  Laterality: Right;  . TEE WITHOUT CARDIOVERSION N/A 02/23/2016   Procedure: TRANSESOPHAGEAL ECHOCARDIOGRAM (TEE);  Surgeon: Quintella Reichert, MD;  Location: [facility] ENDOSCOPY;  Service: Cardiovascular: Normal LV size and function.  Degenerative mitral valve disease with failed posterior leaflet (P2 segment), Severe MR  with pulmonary vein systolic flow reversal. Moderate-TR.    . TEE WITHOUT CARDIOVERSION N/A 03/19/2016   Procedure: TRANSESOPHAGEAL ECHOCARDIOGRAM (TEE);  Surgeon: [name], MD;  Location: [facility] OR;  Service: Open Heart Surgery;  Laterality: N/A;  . TRANSTHORACIC ECHOCARDIOGRAM  02/14/2016   EF 50-55% with moderate LVH. Possible inferolateral hypokinesis. Moderate-severe MR with posterior leaflet prolapse, cannot rule  out flail. Severe LA dilation. Moderate TR.     Family History  Problem Relation Age of Onset  . Hypertension Mother   . Stroke Mother   . Heart disease Father   . Heart attack Father   . Dementia Sister   . Retinoblastoma Son   . COPD Neg Hx   . Cancer Neg Hx   . Diabetes Neg Hx      Social History   Socioeconomic History  . Marital status: Married    Spouse name: Not on file  . Number of children: Not on file  . Years of education: Not on file  . Highest education level: Not on file  Occupational History  . Occupation: retired  Engineer, production  . Financial resource strain: Not hard at all  . Food insecurity:    Worry: Never true    Inability: Never true  . Transportation needs:    Medical: No    Non-medical: No  Tobacco Use  . Smoking status: Never Smoker  . Smokeless tobacco: Never Used  Substance and Sexual Activity  . Alcohol use: No  . Drug use: No  . Sexual activity: Never  Lifestyle  . Physical activity:    Days per week: 0 days    Minutes per session: 0 min  . Stress: Not at all  Relationships  . Social connections:    Talks on phone: More than three times a week    Gets together: More than three times a week    Attends religious service: More than 4 times per year    Active member of club or organization: No    Attends meetings of clubs or organizations: Never    Relationship status: Married  . Intimate partner violence:    Fear of current or ex partner: No    Emotionally abused: No    Physically abused: No    Forced sexual activity: No  Other Topics Concern  . Not on file  Social History Narrative  . Not on file     BP 132/64   Pulse 78   Ht 5\' 11"  (1.803 m)   Wt 195 lb (88.5 kg)   BMI 27.20 kg/m   Physical Exam:  Well appearing NAD HEENT: Unremarkable Neck:  No JVD, no thyromegally Lymphatics:  No adenopathy Back:  No CVA tenderness Lungs:  Clear HEART:  Regular rate rhythm, no murmurs, no rubs, no clicks Abd:  soft,  positive bowel sounds, no organomegally, no rebound, no guarding Ext:  2 plus pulses, no edema, no cyanosis, no clubbing Skin:  No rashes no nodules Neuro:  CN II through XII intact, motor grossly intact  EKG - nsr with AV pacing  DEVICE  Normal device function.  See PaceArt for details.   Assess/Plan: 1. VT - he is maintaining NSR. He will continue oral amio. He will reduce his dose and take none on Sunday. 2. Chronic systolic heart failure - despite RV pacing, his symptoms are class 2. He remains active. He is going deer Engineer, site. 3. CHB - he is pacing almost 100% of the time in the ventricle.  Mikle Bosworth.D.

## 2018-04-03 NOTE — Patient Instructions (Addendum)
Medication Instructions:  Your physician has recommended you make the following change in your medication:   1.  Reduce your amiodarone 200 mg tablets--Take one tablet by mouth Monday through Saturday.  Do NOT TAKE amiodarone on Sunday.  If you need a refill on your cardiac medications before your next appointment, please call your pharmacy.   Lab work: None ordered.  If you have labs (blood work) drawn today and your tests are completely normal, you will receive your results only by: Marland Kitchen MyChart Message (if you have MyChart) OR . A paper copy in the mail If you have any lab test that is abnormal or we need to change your treatment, we will call you to review the results.  Testing/Procedures: None ordered.  Follow-Up:  Your physician wants you to follow-up in: 6 months with Dr. Ladona Ridgel.    You will receive a reminder letter in the mail two months in advance. If you don't receive a letter, please call our office to schedule the follow-up appointment.  Remote monitoring is used to monitor your ICD from home. This monitoring reduces the number of office visits required to check your device to one time per year. It allows [place] to keep an eye on the functioning of your device to ensure it is working properly. You are scheduled for a device check from home on 06/22/2018. You may send your transmission at any time that day. If you have a wireless device, the transmission will be sent automatically. After your physician reviews your transmission, you will receive a postcard with your next transmission date.  At [facility], you and your health needs are our priority.  As part of our continuing mission to provide you with exceptional heart care, we have created designated Provider Care Teams.  These Care Teams include your primary Cardiologist (physician) and Advanced Practice Providers (APPs -  Physician Assistants and Nurse Practitioners) who all work together to provide you with the care you need,  when you need it. Bonita Quin may see Lewayne Bunting, MD or one of the following Advanced Practice Providers on your designated Care Team:   . Gypsy Balsam, NP . Francis Dowse, PA-C   Any Other Special Instructions Will Be Listed Below (If Applicable).

## 2018-04-06 ENCOUNTER — Telehealth: Payer: Self-pay

## 2018-04-06 MED ORDER — AMLODIPINE BESYLATE 2.5 MG PO TABS: 2.5000 mg | ORAL_TABLET | Freq: Every day | ORAL | 1 refills | Status: DC

## 2018-04-06 NOTE — Telephone Encounter (Signed)
Refill for Amlodipine 2.5 mg 30 day supply sent to [facility].

## 2018-04-14 LAB — CUP PACEART REMOTE DEVICE CHECK
Battery Remaining Longevity: 94 mo
Battery Voltage: 2.99 V
Brady Statistic AP VP Percent: 66.65 %
Brady Statistic AP VS Percent: 0.01 %
Brady Statistic AS VP Percent: 33.31 %
Brady Statistic AS VS Percent: 0.03 %
Brady Statistic RA Percent Paced: 66.59 %
Brady Statistic RV Percent Paced: 99.89 %
Date Time Interrogation Session: 20191021073425
HighPow Impedance: 66 Ohm
Implantable Lead Implant Date: 20171025
Implantable Lead Implant Date: 20171025
Implantable Lead Location: 753859
Implantable Lead Location: 753860
Implantable Lead Model: 5076
Implantable Pulse Generator Implant Date: 20171025
Lead Channel Impedance Value: 342 Ohm
Lead Channel Impedance Value: 399 Ohm
Lead Channel Impedance Value: 456 Ohm
Lead Channel Pacing Threshold Amplitude: 0.75 V
Lead Channel Pacing Threshold Amplitude: 0.875 V
Lead Channel Pacing Threshold Pulse Width: 0.4 ms
Lead Channel Pacing Threshold Pulse Width: 0.4 ms
Lead Channel Sensing Intrinsic Amplitude: 2.875 mV
Lead Channel Sensing Intrinsic Amplitude: 2.875 mV
Lead Channel Sensing Intrinsic Amplitude: 5.5 mV
Lead Channel Sensing Intrinsic Amplitude: 5.5 mV
Lead Channel Setting Pacing Amplitude: 2 V
Lead Channel Setting Pacing Amplitude: 2.5 V
Lead Channel Setting Pacing Pulse Width: 0.4 ms
Lead Channel Setting Sensing Sensitivity: 0.3 mV

## 2018-04-18 ENCOUNTER — Other Ambulatory Visit: Payer: Self-pay | Admitting: Internal Medicine

## 2018-04-21 ENCOUNTER — Other Ambulatory Visit: Payer: Self-pay | Admitting: *Deleted

## 2018-04-21 DIAGNOSIS — I5022 Chronic systolic (congestive) heart failure: Secondary | ICD-10-CM

## 2018-04-21 DIAGNOSIS — I472 Ventricular tachycardia, unspecified: Secondary | ICD-10-CM

## 2018-04-21 DIAGNOSIS — Z95810 Presence of automatic (implantable) cardiac defibrillator: Secondary | ICD-10-CM

## 2018-04-21 DIAGNOSIS — I428 Other cardiomyopathies: Secondary | ICD-10-CM

## 2018-04-21 MED ORDER — AMIODARONE HCL 200 MG PO TABS: ORAL_TABLET | ORAL | 3 refills | Status: DC

## 2018-05-06 ENCOUNTER — Ambulatory Visit: Payer: Medicare Other | Admitting: Internal Medicine

## 2018-05-14 ENCOUNTER — Other Ambulatory Visit: Payer: Self-pay | Admitting: Internal Medicine

## 2018-05-14 ENCOUNTER — Other Ambulatory Visit: Payer: Self-pay | Admitting: *Deleted

## 2018-05-14 DIAGNOSIS — Z95810 Presence of automatic (implantable) cardiac defibrillator: Secondary | ICD-10-CM

## 2018-05-14 DIAGNOSIS — I5022 Chronic systolic (congestive) heart failure: Secondary | ICD-10-CM

## 2018-05-14 DIAGNOSIS — I428 Other cardiomyopathies: Secondary | ICD-10-CM

## 2018-05-14 DIAGNOSIS — I472 Ventricular tachycardia, unspecified: Secondary | ICD-10-CM

## 2018-05-14 MED ORDER — AMLODIPINE BESYLATE 2.5 MG PO TABS: 2.5000 mg | ORAL_TABLET | Freq: Every day | ORAL | 1 refills | Status: DC

## 2018-05-14 NOTE — Telephone Encounter (Signed)
[address] patient.  Dr. Ladona Ridgel Prescribed

## 2018-05-22 ENCOUNTER — Telehealth: Payer: Self-pay | Admitting: Internal Medicine

## 2018-05-22 NOTE — Telephone Encounter (Signed)
Patient should still have refills. Last order was 90 day supply with 3 refills 11/19

## 2018-05-22 NOTE — Telephone Encounter (Signed)
°*  STAT* If patient is at the pharmacy, call can be transferred to refill team.   1. Which medications need to be refilled? (please list name of each medication and dose if known)  Amiodarone 200 MG 1 tablet daily, do not take on Sunday  2. Which pharmacy/location (including street and city if local pharmacy) is medication to be sent to? [facility] in [place]  3. Do they need a 30 day or 90 day supply? 90 day

## 2018-05-25 ENCOUNTER — Other Ambulatory Visit: Payer: Self-pay

## 2018-05-25 DIAGNOSIS — Z95810 Presence of automatic (implantable) cardiac defibrillator: Secondary | ICD-10-CM

## 2018-05-25 DIAGNOSIS — I472 Ventricular tachycardia, unspecified: Secondary | ICD-10-CM

## 2018-05-25 DIAGNOSIS — I428 Other cardiomyopathies: Secondary | ICD-10-CM

## 2018-05-25 DIAGNOSIS — I5022 Chronic systolic (congestive) heart failure: Secondary | ICD-10-CM

## 2018-05-25 MED ORDER — AMIODARONE HCL 200 MG PO TABS: ORAL_TABLET | ORAL | 0 refills | Status: DC

## 2018-05-25 NOTE — Telephone Encounter (Signed)
Requested Prescriptions   Signed Prescriptions Disp Refills  . amiodarone (PACERONE) 200 MG tablet 90 tablet 0    Sig: Take one tablet by mouth daily Monday through Saturday.  DO NOT take on Sunday.    Authorizing Provider: END, CHRISTOPHER    Ordering User: Tresten Pantoja N    

## 2018-05-25 NOTE — Telephone Encounter (Signed)
°*  STAT* If patient is at the pharmacy, call can be transferred to refill team.   1. Which medications need to be refilled? (please list name of each medication and dose if known)  Amiodarone 200 MG - 1 tablet daily, not taken on Sunday  2. Which pharmacy/location (including street and city if local pharmacy) is medication to be sent to? [facility] in [place]   3. Do they need a 30 day or 90 day supply? 90 day   Patient calling, states that he has never received a 90 day supply of the medication only 30 day so he is currently out. Please refill.

## 2018-05-25 NOTE — Telephone Encounter (Signed)
Requested Prescriptions   Signed Prescriptions Disp Refills  . amiodarone (PACERONE) 200 MG tablet 90 tablet 0    Sig: Take one tablet by mouth daily Monday through Saturday.  DO NOT take on Sunday.    Authorizing Provider: END, CHRISTOPHER    Ordering User: Margrett Rud

## 2018-06-12 ENCOUNTER — Ambulatory Visit (INDEPENDENT_AMBULATORY_CARE_PROVIDER_SITE_OTHER): Payer: Medicare Other | Admitting: Physician Assistant

## 2018-06-12 ENCOUNTER — Encounter: Payer: Self-pay | Admitting: Internal Medicine

## 2018-06-12 VITALS — BP 110/70 | HR 68 | Ht 71.0 in | Wt 192.0 lb

## 2018-06-12 DIAGNOSIS — I340 Nonrheumatic mitral (valve) insufficiency: Secondary | ICD-10-CM

## 2018-06-12 DIAGNOSIS — Z95810 Presence of automatic (implantable) cardiac defibrillator: Secondary | ICD-10-CM

## 2018-06-12 DIAGNOSIS — I428 Other cardiomyopathies: Secondary | ICD-10-CM | POA: Diagnosis not present

## 2018-06-12 DIAGNOSIS — N183 Chronic kidney disease, stage 3 unspecified: Secondary | ICD-10-CM

## 2018-06-12 DIAGNOSIS — Z79899 Other long term (current) drug therapy: Secondary | ICD-10-CM

## 2018-06-12 DIAGNOSIS — I10 Essential (primary) hypertension: Secondary | ICD-10-CM

## 2018-06-12 DIAGNOSIS — I5022 Chronic systolic (congestive) heart failure: Secondary | ICD-10-CM | POA: Diagnosis not present

## 2018-06-12 DIAGNOSIS — I472 Ventricular tachycardia, unspecified: Secondary | ICD-10-CM

## 2018-06-12 DIAGNOSIS — Z9889 Other specified postprocedural states: Secondary | ICD-10-CM

## 2018-06-12 NOTE — Patient Instructions (Signed)
Medication Instructions:  - Your physician recommends that you continue on your current medications as directed. Please refer to the Current Medication list given to you today.  If you need a refill on your cardiac medications before your next appointment, please call your pharmacy.   Lab work: - Your physician recommends that you have lab work today: CMET/ TSH   If you have labs (blood work) drawn today and your tests are completely normal, you will receive your results only by: Marland Kitchen MyChart Message (if you have MyChart) OR . A paper copy in the mail If you have any lab test that is abnormal or we need to change your treatment, we will call you to review the results.  Testing/Procedures: - none ordered  Follow-Up: At [facility], you and your health needs are our priority.  As part of our continuing mission to provide you with exceptional heart care, we have created designated Provider Care Teams.  These Care Teams include your primary Cardiologist (physician) and Advanced Practice Providers (APPs -  Physician Assistants and Nurse Practitioners) who all work together to provide you with the care you need, when you need it. You will need a follow up appointment in 6 months.  Please call our office 2 months in advance to schedule this appointment.  You may see Dr. Cristal Deer End or one of the following Advanced Practice Providers on your designated Care Team:   Nicolasa Ducking, NP Eula Listen, PA-C . Marisue Ivan, PA-C  . Please schedule a follow up appointment with Dr. Ladona Ridgel in [place] next available [phone]   Any Other Special Instructions Will Be Listed Below (If Applicable). - N/A

## 2018-06-12 NOTE — Progress Notes (Signed)
Cardiology Office Note Date:  06/12/2018  Patient ID:  Roy Palmer, DOB [birth year], MRN [id number] PCP:  Marjie Skiff, NP  Cardiologist:  Dr. Okey Dupre, MD    Chief Complaint: Follow up  History of Present Illness: Roy Palmer is a 85 y.o. male with history of nonobstructive CAD by cardiac cath as below, chronic systolic CHF secondary to nonischemic cardiomyopathy, ventricular tachycardia status post ICD in 03/2016, severe mitral regurgitation status post mitral valve repair in 03/2016, CKD stage III, hypertension, and hyperlipidemia who presents for follow-up of his nonischemic cardiomyopathy.  Patient underwent TTE in 03/2016 that showed an EF of 50% with possible inferolateral hypokinesis, moderate to severe mitral regurgitation noted with prolapse/flail of the posterior leaflet, severe left atrial enlargement, and moderate tricuspid regurgitation.  Follow-up TEE in 02/2016 showed low normal LVEF, trace AI, severe, eccentric mitral regurgitation with reversal of pulmonary vein flow and flail posterior leaflet, no left atrial thrombus, lipomatous hypertrophy of the septum, and mild to moderate tricuspid regurgitation.  Right and left cardiac cath on 02/23/2016 showed mild nonobstructive CAD with 30% ostial left main stenosis, 30% ostial LAD stenosis, 30% ostial ramus intermedius stenosis, and minimal luminal irregularities of a dominant LCx.  Normal right heart filling pressures, decreased cardiac output of 3.6 and a cardiac index of 1.8 in the setting of frequent PVCs and severe MR.  Follow-up 24-hour Holter monitor showed predominant sinus rhythm with an average heart rate of 63 bpm with a range of 49 to 91 bpm, longest R-R interval was 1.6 seconds, frequent PVCs noted with a burden of 11% including frequent couplets and bigeminal cycles.  90 runs of NSVT were noted lasting up to 9 beats with a maximum rate of 134 bpm, rare SVT and supraventricular ectopy was noted.  The patient was started on  evidence-based heart failure therapy and antiarrhythmic therapy though he did not tolerate these secondary to hallucinations, nightmares, and prior baseline sinus bradycardia.  The patient underwent dual-chamber ICD implantation on 03/27/2016.  He has subsequently been placed back on amiodarone and is tolerating this without issues.  He has not been maintained on evidence-based heart failure therapy secondary to intolerance.  Most recent echocardiogram from 04/25/2017 showed an EF of 25 to 30%, mildly dilated LV with moderate LVH, diffuse hypokinesis, aortic sclerosis, moderately elevated transmitral gradient status post repair with moderate mitral regurgitation, mild to moderate left atrial enlargement, mildly reduced RV systolic function.  He was last seen in our office on 01/28/2018 and was doing well.  Weight was stable at 196 pounds.  He was last seen by electrophysiology on 04/03/2018 and doing well.  At that time, he was advised to not take amiodarone on Sundays.  He comes in doing well today.  He denies any chest pain, shortness of breath, DOE, palpitations, lightheadedness, dizziness, presyncope, syncope, lower extremity swelling, abdominal distention, orthopnea, PND, early satiety.  He is compliant with amiodarone, Norvasc, and aspirin.  He continues to live an active lifestyle and regularly hunt and fish.  He denies any ICD shocks.  He plans to contact EP for follow-up in the near future.  He does not have any issues or concerns at this time.   Past Medical History:  Diagnosis Date  . AAA (abdominal aortic aneurysm) without rupture (HCC) 03/12/2016   Small - measured 3.3 x 3.5 cm by CTA  . CKD (chronic kidney disease) stage 3, GFR 30-59 ml/min (HCC)   . Essential hypertension   . Hyperlipidemia   .  Hypertensive kidney disease   . Incidental pulmonary nodule 03/12/2016   Vague opacity right lung base requires radiographic follow up - index of suspicion is LOW  . Near syncope    Cardiogenic,  related to an SVT and severe MR  . Non-sustained ventricular tachycardia (HCC)   . S/P minimally invasive mitral valve repair 03/19/2016   Complex valvuloplasty including triangular resection of flail segment of posterior leaflet, chordal transposition x1, artificial Gore-tex neochord placement x4 and 34 mm Sorin Memo 3D ring annuloplasty via right mini thoracotomy approach with clipping of LA appendage  . Severe mitral regurgitation by prior echocardiogram 02/23/2016   Confirmed by TEE  . Syncope 03/15/2016  . Thoracic aortic aneurysm (HCC) 03/12/2016   Very very mild fusiform enlargement of distal transverse and proximal descending thoracic aorta noted on CTA  . Trigeminal neuralgia     Past Surgical History:  Procedure Laterality Date  . CARDIAC CATHETERIZATION N/A 02/23/2016   Procedure: Right/Left Heart Cath and Coronary Angiography;  Surgeon: [name], MD;  Location: [facility] INVASIVE CV LAB;  Service: Cardiovascular: Angiographic minimal coronary disease. Normal left and right heart Pressures. Decreased CO/CI in settng of frequent PVCs & Severe MR.  Marland Kitchen CATARACT EXTRACTION, BILATERAL    . COLONOSCOPY  08/2005  . CYST EXCISION  10/2013   from neck  . EP IMPLANTABLE DEVICE N/A 03/27/2016   Procedure: ICD Implant Dual Chamber;  Surgeon: Marinus Maw, MD;  Location: [facility] INVASIVE CV LAB;  Service: Cardiovascular;  Laterality: N/A;  . ESOPHAGOGASTRODUODENOSCOPY  02/2010  . MITRAL VALVE REPAIR Right 03/19/2016   Procedure: MINIMALLY INVASIVE MITRAL VALVE REPAIR (MVR);  Surgeon: [name], MD;  Location: [facility] OR;  Service: Open Heart Surgery;  Laterality: Right;  . TEE WITHOUT CARDIOVERSION N/A 02/23/2016   Procedure: TRANSESOPHAGEAL ECHOCARDIOGRAM (TEE);  Surgeon: Quintella Reichert, MD;  Location: [facility] ENDOSCOPY;  Service: Cardiovascular: Normal LV size and function.  Degenerative mitral valve disease with failed posterior leaflet (P2 segment), Severe MR with pulmonary vein systolic flow  reversal. Moderate-TR.    . TEE WITHOUT CARDIOVERSION N/A 03/19/2016   Procedure: TRANSESOPHAGEAL ECHOCARDIOGRAM (TEE);  Surgeon: [name], MD;  Location: [facility] OR;  Service: Open Heart Surgery;  Laterality: N/A;  . TRANSTHORACIC ECHOCARDIOGRAM  02/14/2016   EF 50-55% with moderate LVH. Possible inferolateral hypokinesis. Moderate-severe MR with posterior leaflet prolapse, cannot rule out flail. Severe LA dilation. Moderate TR.    Current Meds  Medication Sig  . amiodarone (PACERONE) 200 MG tablet Take one tablet by mouth daily Monday through Saturday.  DO NOT take on Sunday.  Marland Kitchen amLODipine (NORVASC) 2.5 MG tablet Take 1 tablet (2.5 mg total) by mouth daily.  Marland Kitchen aspirin EC 81 MG tablet Take 1 tablet (81 mg total) by mouth daily.    Allergies:   Amiodarone and Lyrica [pregabalin]   Social History:  The patient  reports that he has never smoked. He has never used smokeless tobacco. He reports that he does not drink alcohol or use drugs.   Family History:  The patient's family history includes Dementia in his sister; Heart attack in his father; Heart disease in his father; Hypertension in his mother; Retinoblastoma in his son; Stroke in his mother.  ROS:   Review of Systems  Constitutional: Negative for chills, diaphoresis, fever, malaise/fatigue and weight loss.  HENT: Negative for congestion.   Eyes: Negative for discharge and redness.  Respiratory: Negative for cough, hemoptysis, sputum production, shortness of breath and wheezing.   Cardiovascular:  Negative for chest pain, palpitations, orthopnea, claudication, leg swelling and PND.  Gastrointestinal: Negative for abdominal pain, blood in stool, heartburn, melena, nausea and vomiting.  Genitourinary: Negative for hematuria.  Musculoskeletal: Negative for falls and myalgias.  Skin: Negative for rash.  Neurological: Negative for dizziness, tingling, tremors, sensory change, speech change, focal weakness, loss of consciousness and  weakness.  Endo/Heme/Allergies: Does not bruise/bleed easily.  Psychiatric/Behavioral: Negative for substance abuse. The patient is not nervous/anxious.   All other systems reviewed and are negative.    PHYSICAL EXAM:  VS:  BP 110/70 (BP Location: Left Arm, Patient Position: Sitting, Cuff Size: Normal)   Pulse 68   Ht 5\' 11"  (1.803 m)   Wt 192 lb (87.1 kg)   BMI 26.78 kg/m  BMI: Body mass index is 26.78 kg/m.  Physical Exam  Constitutional: He is oriented to person, place, and time. He appears well-developed and well-nourished.  HENT:  Head: Normocephalic and atraumatic.  Eyes: Right eye exhibits no discharge. Left eye exhibits no discharge.  Neck: Normal range of motion. No JVD present.  Cardiovascular: Normal rate, regular rhythm, S1 normal, S2 normal and normal heart sounds. Exam reveals no distant heart sounds, no friction rub, no midsystolic click and no opening snap.  No murmur heard. Pulses:      Posterior tibial pulses are 2+ on the right side and 2+ on the left side.  Pulmonary/Chest: Effort normal and breath sounds normal. No respiratory distress. He has no decreased breath sounds. He has no wheezes. He has no rales. He exhibits no tenderness.  Abdominal: Soft. He exhibits no distension. There is no abdominal tenderness.  Musculoskeletal:        General: No edema.  Neurological: He is alert and oriented to person, place, and time.  Skin: Skin is warm and dry. No cyanosis. Nails show no clubbing.  Psychiatric: He has a normal mood and affect. His speech is normal and behavior is normal. Judgment and thought content normal.     EKG:  Was ordered and interpreted by me today. Shows paced rhythm, 60 bpm  Recent Labs: 07/04/2017: BUN 24; Creatinine, Ser 1.49; Potassium 5.2; Sodium 141  No results found for requested labs within last 8760 hours.   CrCl cannot be calculated (Patient's most recent lab result is older than the maximum 21 days allowed.).   Wt Readings from  Last 3 Encounters:  06/12/18 192 lb (87.1 kg)  04/03/18 195 lb (88.5 kg)  01/28/18 196 lb 8 oz (89.1 kg)     Other studies reviewed: Additional studies/records reviewed today include: summarized above  ASSESSMENT AND PLAN:  1. Chronic systolic CHF secondary to nonischemic cardiomyopathy: The patient is doing well, appears well compensated and euvolemic with NYHA class II symptoms.  He remains off all classes of evidence-based heart failure therapy given intolerance and is not interested in rechallenge at this time.  CHF education was provided.  2. VT: No symptoms concerning for recurrent arrhythmia.  No syncope or device shocks since his last visit.  He remains well controlled on amiodarone 6 days/week.  I have advised him to contact our [facility] office for follow-up with Dr. Ladona Ridgel and the device clinic.  We are checking a TSH and CMP today given his amiodarone therapy.  Follow-up with ophthalmology for yearly eye exam given amiodarone therapy.  3. Mitral valve regurgitation status post repair: Asymptomatic.  Continue aspirin 81 mg daily.  He will need SBE prophylaxis before all dental procedures.  4. CKD stage III:  Check CMP as above.  5. Hypertension: Blood pressure is well controlled.  Continue low-dose amlodipine.  Disposition: F/u with Dr. Okey DupreEnd in 6 months, sooner if needed.  Patient preferred 6 months follow-up.  Advised patient to [facility] our [address] [facility] for follow-up with Dr. Ladona Ridgelaylor.  Current medicines are reviewed at length with the patient today.  The patient did not have any concerns regarding medicines.  Elinor DodgeSigned, Om Lizotte PA-C 06/12/2018 3:24 PM     [facility] [address] [place], [place] [address] [phone]

## 2018-06-13 LAB — COMPREHENSIVE METABOLIC PANEL
ALT: 20 IU/L (ref 0–44)
AST: 16 IU/L (ref 0–40)
Albumin/Globulin Ratio: 1.7 (ref 1.2–2.2)
Albumin: 4.2 g/dL (ref 3.5–4.7)
Alkaline Phosphatase: 69 IU/L (ref 39–117)
BUN/Creatinine Ratio: 13 (ref 10–24)
BUN: 22 mg/dL (ref 8–27)
Bilirubin Total: 0.4 mg/dL (ref 0.0–1.2)
CO2: 20 mmol/L (ref 20–29)
Calcium: 8.7 mg/dL (ref 8.6–10.2)
Chloride: 99 mmol/L (ref 96–106)
Creatinine, Ser: 1.74 mg/dL — ABNORMAL HIGH (ref 0.76–1.27)
GFR calc Af Amer: 40 mL/min/{1.73_m2} — ABNORMAL LOW (ref >59)
GFR calc non Af Amer: 35 mL/min/{1.73_m2} — ABNORMAL LOW (ref >59)
Globulin, Total: 2.5 g/dL (ref 1.5–4.5)
Glucose: 134 mg/dL — ABNORMAL HIGH (ref 65–99)
Potassium: 4.6 mmol/L (ref 3.5–5.2)
Sodium: 137 mmol/L (ref 134–144)
Total Protein: 6.7 g/dL (ref 6.0–8.5)

## 2018-06-13 LAB — TSH: TSH: 1.33 u[IU]/mL (ref 0.450–4.500)

## 2018-06-15 ENCOUNTER — Telehealth: Payer: Self-pay | Admitting: *Deleted

## 2018-06-15 DIAGNOSIS — I10 Essential (primary) hypertension: Secondary | ICD-10-CM

## 2018-06-15 DIAGNOSIS — I5022 Chronic systolic (congestive) heart failure: Secondary | ICD-10-CM

## 2018-06-15 NOTE — Telephone Encounter (Signed)
No answer. Left message to call back.   

## 2018-06-15 NOTE — Telephone Encounter (Signed)
-----   Message from Sondra Barges, PA-C sent at 06/14/2018  7:26 AM EST ----- Renal function is above baseline.  Potassium ok.  Random glucose is a little high.  Liver function is normal.  Thyroid function is normal.   Please increase water intake some.  Needs repeat bmet in 1 week.

## 2018-06-16 NOTE — Telephone Encounter (Signed)
Results called to pt. Pt verbalized understanding. Repeat bmet order entered. [facility] verbal[facility]

## 2018-06-17 ENCOUNTER — Ambulatory Visit: Payer: Self-pay

## 2018-06-18 ENCOUNTER — Ambulatory Visit: Payer: Self-pay

## 2018-06-19 ENCOUNTER — Other Ambulatory Visit
Admission: RE | Admit: 2018-06-19 | Discharge: 2018-06-19 | Disposition: A | Discharge status: Home / Self Care | Payer: Medicare Other | Source: Ambulatory Visit | Attending: Physician Assistant | Admitting: Physician Assistant

## 2018-06-19 ENCOUNTER — Telehealth: Payer: Self-pay | Admitting: *Deleted

## 2018-06-19 DIAGNOSIS — I10 Essential (primary) hypertension: Secondary | ICD-10-CM | POA: Diagnosis present

## 2018-06-19 DIAGNOSIS — I5022 Chronic systolic (congestive) heart failure: Secondary | ICD-10-CM | POA: Diagnosis present

## 2018-06-19 DIAGNOSIS — N183 Chronic kidney disease, stage 3 unspecified: Secondary | ICD-10-CM

## 2018-06-19 LAB — BASIC METABOLIC PANEL
Anion gap: 7 (ref 5–15)
BUN: 29 mg/dL — ABNORMAL HIGH (ref 8–23)
CO2: 26 mmol/L (ref 22–32)
Calcium: 8.6 mg/dL — ABNORMAL LOW (ref 8.9–10.3)
Chloride: 103 mmol/L (ref 98–111)
Creatinine, Ser: 1.84 mg/dL — ABNORMAL HIGH (ref 0.61–1.24)
GFR calc Af Amer: 38 mL/min — ABNORMAL LOW (ref >60)
GFR calc non Af Amer: 32 mL/min — ABNORMAL LOW (ref >60)
Glucose, Bld: 172 mg/dL — ABNORMAL HIGH (ref 70–99)
Potassium: 4.4 mmol/L (ref 3.5–5.1)
Sodium: 136 mmol/L (ref 135–145)

## 2018-06-19 NOTE — Telephone Encounter (Signed)
Patient made aware of results and verbalized understanding.  Referral has been placed to Nephrology.

## 2018-06-19 NOTE — Telephone Encounter (Signed)
-----   Message from Sondra Barges, PA-C sent at 06/19/2018  3:33 PM EST ----- Renal function continues to worsen.  Patient is not on any nephrotoxic medications.  Potassium is stable.  If he has not already done so, increase water intake.  Please refer the patient to nephrology.

## 2018-06-22 ENCOUNTER — Ambulatory Visit (INDEPENDENT_AMBULATORY_CARE_PROVIDER_SITE_OTHER): Payer: Medicare Other

## 2018-06-22 DIAGNOSIS — I5022 Chronic systolic (congestive) heart failure: Secondary | ICD-10-CM

## 2018-06-22 DIAGNOSIS — I428 Other cardiomyopathies: Secondary | ICD-10-CM | POA: Diagnosis not present

## 2018-06-23 LAB — CUP PACEART REMOTE DEVICE CHECK
Battery Remaining Longevity: 90 mo
Battery Voltage: 2.99 V
Brady Statistic AP VP Percent: 65.39 %
Brady Statistic AP VS Percent: 0.02 %
Brady Statistic AS VP Percent: 34.52 %
Brady Statistic AS VS Percent: 0.07 %
Brady Statistic RA Percent Paced: 65.27 %
Brady Statistic RV Percent Paced: 99.77 %
Date Time Interrogation Session: 20200120062505
HighPow Impedance: 72 Ohm
Implantable Lead Implant Date: 20171025
Implantable Lead Implant Date: 20171025
Implantable Lead Location: 753859
Implantable Lead Location: 753860
Implantable Lead Model: 5076
Implantable Pulse Generator Implant Date: 20171025
Lead Channel Impedance Value: 399 Ohm
Lead Channel Impedance Value: 456 Ohm
Lead Channel Impedance Value: 456 Ohm
Lead Channel Pacing Threshold Amplitude: 0.75 V
Lead Channel Pacing Threshold Amplitude: 0.75 V
Lead Channel Pacing Threshold Pulse Width: 0.4 ms
Lead Channel Pacing Threshold Pulse Width: 0.4 ms
Lead Channel Sensing Intrinsic Amplitude: 2.625 mV
Lead Channel Sensing Intrinsic Amplitude: 2.625 mV
Lead Channel Sensing Intrinsic Amplitude: 5.5 mV
Lead Channel Sensing Intrinsic Amplitude: 5.5 mV
Lead Channel Setting Pacing Amplitude: 2 V
Lead Channel Setting Pacing Amplitude: 2.5 V
Lead Channel Setting Pacing Pulse Width: 0.4 ms
Lead Channel Setting Sensing Sensitivity: 0.3 mV

## 2018-06-23 NOTE — Progress Notes (Signed)
Remote ICD transmission.   

## 2018-07-08 ENCOUNTER — Ambulatory Visit (INDEPENDENT_AMBULATORY_CARE_PROVIDER_SITE_OTHER): Payer: Medicare Other

## 2018-07-08 VITALS — BP 118/74 | HR 66 | Temp 98.0°F | Resp 16 | Ht 70.0 in | Wt 189.7 lb

## 2018-07-08 DIAGNOSIS — Z0000 Encounter for general adult medical examination without abnormal findings: Secondary | ICD-10-CM

## 2018-07-08 NOTE — Progress Notes (Signed)
Subjective:   Roy Palmer is a 85 y.o. male who presents for Medicare Annual/Subsequent preventive examination.  Review of Systems:  Cardiac Risk Factors include: advanced age (>75men, >28 women);hypertension;male gender     Objective:    Vitals: BP 118/74 (BP Location: Left Arm, Patient Position: Sitting, Cuff Size: Normal)   Pulse 66   Temp 98 F (36.7 C) (Temporal)   Resp 16   Ht 5\' 10"  (1.778 m)   Wt 189 lb 11.2 oz (86 kg)   BMI 27.22 kg/m   Body mass index is 27.22 kg/m.  Advanced Directives 07/08/2018 06/11/2017 04/25/2017 04/24/2017 06/24/2016 03/14/2016  Does Patient Have a Medical Advance Directive? Yes Yes Yes Yes Yes No  Type of Advance Directive Living will Living will;Healthcare Power of Attorney Living will Living will - -  Does patient want to make changes to medical advance directive? - - No - Patient declined - - -  Copy of Healthcare Power of Attorney in Chart? - No - copy requested - - - -  Would patient like information on creating a medical advance directive? - - - - - No - patient declined information    Tobacco Social History   Tobacco Use  Smoking Status Never Smoker  Smokeless Tobacco Never Used     Counseling given: Not Answered   Clinical Intake:  Pre-visit preparation completed: Yes  Pain : No/denies pain     Nutritional Status: BMI 25 -29 Overweight Nutritional Risks: None Diabetes: No  How often do you need to have someone help you when you read instructions, pamphlets, or other written materials from your doctor or pharmacy?: 1 - Never What is the last grade level you completed in school?: associates degree   Interpreter Needed?: No  Information entered by :: Tiffany Hill,LPN   Past Medical History:  Diagnosis Date  . AAA (abdominal aortic aneurysm) without rupture (HCC) 03/12/2016   Small - measured 3.3 x 3.5 cm by CTA  . CKD (chronic kidney disease) stage 3, GFR 30-59 ml/min (HCC)   . Essential hypertension   .  Hyperlipidemia   . Hypertensive kidney disease   . Incidental pulmonary nodule 03/12/2016   Vague opacity right lung base requires radiographic follow up - index of suspicion is LOW  . Near syncope    Cardiogenic, related to an SVT and severe MR  . Non-sustained ventricular tachycardia (HCC)   . S/P minimally invasive mitral valve repair 03/19/2016   Complex valvuloplasty including triangular resection of flail segment of posterior leaflet, chordal transposition x1, artificial Gore-tex neochord placement x4 and 34 mm Sorin Memo 3D ring annuloplasty via right mini thoracotomy approach with clipping of LA appendage  . Severe mitral regurgitation by prior echocardiogram 02/23/2016   Confirmed by TEE  . Syncope 03/15/2016  . Thoracic aortic aneurysm (HCC) 03/12/2016   Very very mild fusiform enlargement of distal transverse and proximal descending thoracic aorta noted on CTA  . Trigeminal neuralgia    Past Surgical History:  Procedure Laterality Date  . CARDIAC CATHETERIZATION N/A 02/23/2016   Procedure: Right/Left Heart Cath and Coronary Angiography;  Surgeon: [name], MD;  Location: [facility] INVASIVE CV LAB;  Service: Cardiovascular: Angiographic minimal coronary disease. Normal left and right heart Pressures. Decreased CO/CI in settng of frequent PVCs & Severe MR.  Marland Kitchen CATARACT EXTRACTION, BILATERAL    . COLONOSCOPY  08/2005  . CYST EXCISION  10/2013   from neck  . EP IMPLANTABLE DEVICE N/A 03/27/2016   Procedure: ICD Implant  Dual Chamber;  Surgeon: Marinus Maw, MD;  Location: [facility] INVASIVE CV LAB;  Service: Cardiovascular;  Laterality: N/A;  . ESOPHAGOGASTRODUODENOSCOPY  02/2010  . MITRAL VALVE REPAIR Right 03/19/2016   Procedure: MINIMALLY INVASIVE MITRAL VALVE REPAIR (MVR);  Surgeon: [name], MD;  Location: [facility] OR;  Service: Open Heart Surgery;  Laterality: Right;  . TEE WITHOUT CARDIOVERSION N/A 02/23/2016   Procedure: TRANSESOPHAGEAL ECHOCARDIOGRAM (TEE);  Surgeon: Quintella Reichert, MD;  Location: [facility] ENDOSCOPY;  Service: Cardiovascular: Normal LV size and function.  Degenerative mitral valve disease with failed posterior leaflet (P2 segment), Severe MR with pulmonary vein systolic flow reversal. Moderate-TR.    . TEE WITHOUT CARDIOVERSION N/A 03/19/2016   Procedure: TRANSESOPHAGEAL ECHOCARDIOGRAM (TEE);  Surgeon: [name], MD;  Location: [facility] OR;  Service: Open Heart Surgery;  Laterality: N/A;  . TRANSTHORACIC ECHOCARDIOGRAM  02/14/2016   EF 50-55% with moderate LVH. Possible inferolateral hypokinesis. Moderate-severe MR with posterior leaflet prolapse, cannot rule out flail. Severe LA dilation. Moderate TR.   Family History  Problem Relation Age of Onset  . Hypertension Mother   . Stroke Mother   . Heart disease Father   . Heart attack Father   . Dementia Sister   . Retinoblastoma Son   . COPD Neg Hx   . Cancer Neg Hx   . Diabetes Neg Hx    Social History   Socioeconomic History  . Marital status: Married    Spouse name: Not on file  . Number of children: Not on file  . Years of education: Not on file  . Highest education level: Associate degree: academic program  Occupational History  . Occupation: retired  Engineer, production  . Financial resource strain: Not hard at all  . Food insecurity:    Worry: Never true    Inability: Never true  . Transportation needs:    Medical: No    Non-medical: No  Tobacco Use  . Smoking status: Never Smoker  . Smokeless tobacco: Never Used  Substance and Sexual Activity  . Alcohol use: No  . Drug use: No  . Sexual activity: Never  Lifestyle  . Physical activity:    Days per week: 0 days    Minutes per session: 0 min  . Stress: Not at all  Relationships  . Social connections:    Talks on phone: More than three times a week    Gets together: More than three times a week    Attends religious service: More than 4 times per year    Active member of club or organization: No    Attends meetings of clubs or  organizations: Never    Relationship status: Married  Other Topics Concern  . Not on file  Social History Narrative  . Not on file    Outpatient Encounter Medications as of 07/08/2018  Medication Sig  . amiodarone (PACERONE) 200 MG tablet Take one tablet by mouth daily Monday through Saturday.  DO NOT take on Sunday.  Marland Kitchen amLODipine (NORVASC) 2.5 MG tablet Take 1 tablet (2.5 mg total) by mouth daily.  Marland Kitchen aspirin EC 81 MG tablet Take 1 tablet (81 mg total) by mouth daily.   No facility-administered encounter medications on file as of 07/08/2018.     Activities of Daily Living In your present state of health, do you have any difficulty performing the following activities: 07/08/2018  Hearing? N  Vision? N  Difficulty concentrating or making decisions? N  Walking or climbing stairs? N  Dressing or bathing? N  Doing errands, shopping? N  Preparing Food and eating ? N  Using the Toilet? N  In the past six months, have you accidently leaked urine? N  Do you have problems with loss of bowel control? N  Managing your Medications? N  Managing your Finances? N  Housekeeping or managing your Housekeeping? N  Some recent data might be hidden    Patient Care Team: Marjie Skiff, NP as PCP - General (Nurse Practitioner) End, Cristal Deer, MD as Consulting Physician (Cardiology) Marinus Maw, MD as Consulting Physician (Cardiology)   Assessment:   This is a routine wellness examination for Chauncey.  Exercise Activities and Dietary recommendations Current Exercise Habits: The patient does not participate in regular exercise at present, Exercise limited by: None identified  Goals    . DIET - INCREASE WATER INTAKE     Recommend drinking at least 6-8 glasses of water a day        Fall Risk Fall Risk  07/08/2018 06/11/2017 06/24/2016 02/08/2016  Falls in the past year? 0 No No No  Number falls in past yr: 0 - - -   FALL RISK PREVENTION PERTAINING TO THE HOME:  Any stairs in or around the  home WITH handrails? Yes  all stairs have handrails Home free of loose throw rugs in walkways, pet beds, electrical cords, etc? Yes  Adequate lighting in your home to reduce risk of falls? Yes   ASSISTIVE DEVICES UTILIZED TO PREVENT FALLS:  Life alert? No  Use of a cane, walker or w/c? No  Grab bars in the bathroom? No  Shower chair or bench in shower? No  Elevated toilet seat or a handicapped toilet? No   DME ORDERS:  DME order needed?  No   TIMED UP AND GO:  Was the test performed? Yes .  Length of time to ambulate 10 feet: 12 sec.   GAIT:  Appearance of gait: Gait stead-fast without the use of an assistive device.  Education: Fall risk prevention has been discussed.  Intervention(s) required? No    Depression Screen PHQ 2/9 Scores 07/08/2018 06/11/2017 06/24/2016  PHQ - 2 Score 0 0 0  PHQ- 9 Score - - 0    Cognitive Function     6CIT Screen 07/08/2018 06/11/2017  What Year? 0 points 0 points  What month? 0 points 0 points  What time? 0 points 0 points  Count back from 20 0 points 0 points  Months in reverse 0 points 0 points  Repeat phrase 0 points 2 points  Total Score 0 2    Immunization History  Administered Date(s) Administered  . Influenza,inj,Quad PF,6+ Mos 05/07/2016, 02/19/2017, 04/17/2018  . Influenza-Unspecified 04/19/2015  . Pneumococcal Conjugate-13 10/29/2013  . Pneumococcal Polysaccharide-23 07/14/2008  . Td 07/14/2008  . Zoster 06/03/2010    Qualifies for Shingles Vaccine? Yes  Zostavax completed 06/03/2010. Due for Shingrix. Education has been provided regarding the importance of this vaccine. Pt has been advised to call insurance company to determine out of pocket expense. Advised may also receive vaccine at local pharmacy or Health Dept. Verbalized acceptance and understanding.  Tdap: up to date   Flu Vaccine: up to date .  Pneumococcal Vaccine: up to date   Screening Tests Health Maintenance  Topic Date Due  . TETANUS/TDAP  07/14/2018  .  INFLUENZA VACCINE  Completed  . PNA vac Low Risk Adult  Completed   Cancer Screenings:  Colorectal Screening: no longer required   Lung  Cancer Screening: (Low Dose CT Chest recommended if Age 5-80 years, 30 pack-year currently smoking OR have quit w/in 15years.) does not qualify.   Additional Screening:  Hepatitis C Screening: does not qualify  Vision Screening: Recommended annual ophthalmology exams for early detection of glaucoma and other disorders of the eye. Is the patient up to date with their annual eye exam?  Yes  Who is the provider or what is the name of the office in which the pt attends annual eye exams? [facility]   Dental Screening: Recommended annual dental exams for proper oral hygiene  Community Resource Referral:  CRR required this visit?  No       Plan:  I have personally reviewed and addressed the Medicare Annual Wellness questionnaire and have noted the following in the patient's chart:  A. Medical and social history B. Use of alcohol, tobacco or illicit drugs  C. Current medications and supplements D. Functional ability and status E.  Nutritional status F.  Physical activity G. Advance directives H. List of other physicians I.  Hospitalizations, surgeries, and ER visits in previous 12 months J.  Vitals K. Screenings such as hearing and vision if needed, cognitive and depression L. Referrals and appointments   In addition, I have reviewed and discussed with patient certain preventive protocols, quality metrics, and best practice recommendations. A written personalized care plan for preventive services as well as general preventive health recommendations were provided to patient.   Signed,  Marin Robertsiffany Hill, LPN Nurse Health Advisor   Nurse Notes: advised to schedule CPE with Jolene Cannady,NP.

## 2018-07-08 NOTE — Patient Instructions (Signed)
Roy Palmer , Thank you for taking time to come for your Medicare Wellness Visit. I appreciate your ongoing commitment to your health goals. Please review the following plan we discussed and let me know if I can assist you in the future.   Screening recommendations/referrals: Colonoscopy: no longer required Recommended yearly ophthalmology/optometry visit for glaucoma screening and checkup Recommended yearly dental visit for hygiene and checkup  Vaccinations: Influenza vaccine: up to date Pneumococcal vaccine: up to date Tdap vaccine: up to date Shingles vaccine: shingrix eligible, check with your insurance company for coverage     Advanced directives: Please bring a copy of your health care power of attorney and living will to the office at your convenience.  Conditions/risks identified: continue drinking at least 6-8 glasses of water a day.   Next appointment: Follow up in one year for your annual wellness exam.   Preventive Care 65 Years and Older, Male Preventive care refers to lifestyle choices and visits with your health care provider that can promote health and wellness. What does preventive care include?  A yearly physical exam. This is also called an annual well check.  Dental exams once or twice a year.  Routine eye exams. Ask your health care provider how often you should have your eyes checked.  Personal lifestyle choices, including:  Daily care of your teeth and gums.  Regular physical activity.  Eating a healthy diet.  Avoiding tobacco and drug use.  Limiting alcohol use.  Practicing safe sex.  Taking low doses of aspirin every day.  Taking vitamin and mineral supplements as recommended by your health care provider. What happens during an annual well check? The services and screenings done by your health care provider during your annual well check will depend on your age, overall health, lifestyle risk factors, and family history of disease. Counseling    Your health care provider may ask you questions about your:  Alcohol use.  Tobacco use.  Drug use.  Emotional well-being.  Home and relationship well-being.  Sexual activity.  Eating habits.  History of falls.  Memory and ability to understand (cognition).  Work and work Astronomer. Screening  You may have the following tests or measurements:  Height, weight, and BMI.  Blood pressure.  Lipid and cholesterol levels. These may be checked every 5 years, or more frequently if you are over 16 years old.  Skin check.  Lung cancer screening. You may have this screening every year starting at age 57 if you have a 30-pack-year history of smoking and currently smoke or have quit within the past 15 years.  Fecal occult blood test (FOBT) of the stool. You may have this test every year starting at age 2.  Flexible sigmoidoscopy or colonoscopy. You may have a sigmoidoscopy every 5 years or a colonoscopy every 10 years starting at age 62.  Prostate cancer screening. Recommendations will vary depending on your family history and other risks.  Hepatitis C blood test.  Hepatitis B blood test.  Sexually transmitted disease (STD) testing.  Diabetes screening. This is done by checking your blood sugar (glucose) after you have not eaten for a while (fasting). You may have this done every 1-3 years.  Abdominal aortic aneurysm (AAA) screening. You may need this if you are a current or former smoker.  Osteoporosis. You may be screened starting at age 67 if you are at high risk. Talk with your health care provider about your test results, treatment options, and if necessary, the need for more  tests. Vaccines  Your health care provider may recommend certain vaccines, such as:  Influenza vaccine. This is recommended every year.  Tetanus, diphtheria, and acellular pertussis (Tdap, Td) vaccine. You may need a Td booster every 10 years.  Zoster vaccine. You may need this after age  53.  Pneumococcal 13-valent conjugate (PCV13) vaccine. One dose is recommended after age 67.  Pneumococcal polysaccharide (PPSV23) vaccine. One dose is recommended after age 11. Talk to your health care provider about which screenings and vaccines you need and how often you need them. This information is not intended to replace advice given to you by your health care provider. Make sure you discuss any questions you have with your health care provider. Document Released: 06/16/2015 Document Revised: 02/07/2016 Document Reviewed: 03/21/2015 Elsevier Interactive Patient Education  2017 [place] Prevention in the Home Falls can cause injuries. They can happen to people of all ages. There are many things you can do to make your home safe and to help prevent falls. What can I do on the outside of my home?  Regularly fix the edges of walkways and driveways and fix any cracks.  Remove anything that might make you trip as you walk through a door, such as a raised step or threshold.  Trim any bushes or trees on the path to your home.  Use bright outdoor lighting.  Clear any walking paths of anything that might make someone trip, such as rocks or tools.  Regularly check to see if handrails are loose or broken. Make sure that both sides of any steps have handrails.  Any raised decks and porches should have guardrails on the edges.  Have any leaves, snow, or ice cleared regularly.  Use sand or salt on walking paths during winter.  Clean up any spills in your garage right away. This includes oil or grease spills. What can I do in the bathroom?  Use night lights.  Install grab bars by the toilet and in the tub and shower. Do not use towel bars as grab bars.  Use non-skid mats or decals in the tub or shower.  If you need to sit down in the shower, use a plastic, non-slip stool.  Keep the floor dry. Clean up any water that spills on the floor as soon as it happens.  Remove  soap buildup in the tub or shower regularly.  Attach bath mats securely with double-sided non-slip rug tape.  Do not have throw rugs and other things on the floor that can make you trip. What can I do in the bedroom?  Use night lights.  Make sure that you have a light by your bed that is easy to reach.  Do not use any sheets or blankets that are too big for your bed. They should not hang down onto the floor.  Have a firm chair that has side arms. You can use this for support while you get dressed.  Do not have throw rugs and other things on the floor that can make you trip. What can I do in the kitchen?  Clean up any spills right away.  Avoid walking on wet floors.  Keep items that you use a lot in easy-to-reach places.  If you need to reach something above you, use a strong step stool that has a grab bar.  Keep electrical cords out of the way.  Do not use floor polish or wax that makes floors slippery. If you must use wax, use non-skid floor  wax.  Do not have throw rugs and other things on the floor that can make you trip. What can I do with my stairs?  Do not leave any items on the stairs.  Make sure that there are handrails on both sides of the stairs and use them. Fix handrails that are broken or loose. Make sure that handrails are as long as the stairways.  Check any carpeting to make sure that it is firmly attached to the stairs. Fix any carpet that is loose or worn.  Avoid having throw rugs at the top or bottom of the stairs. If you do have throw rugs, attach them to the floor with carpet tape.  Make sure that you have a light switch at the top of the stairs and the bottom of the stairs. If you do not have them, ask someone to add them for you. What else can I do to help prevent falls?  Wear shoes that:  Do not have high heels.  Have rubber bottoms.  Are comfortable and fit you well.  Are closed at the toe. Do not wear sandals.  If you use a  stepladder:  Make sure that it is fully opened. Do not climb a closed stepladder.  Make sure that both sides of the stepladder are locked into place.  Ask someone to hold it for you, if possible.  Clearly mark and make sure that you can see:  Any grab bars or handrails.  First and last steps.  Where the edge of each step is.  Use tools that help you move around (mobility aids) if they are needed. These include:  Canes.  Walkers.  Scooters.  Crutches.  Turn on the lights when you go into a dark area. Replace any light bulbs as soon as they burn out.  Set up your furniture so you have a clear path. Avoid moving your furniture around.  If any of your floors are uneven, fix them.  If there are any pets around you, be aware of where they are.  Review your medicines with your doctor. Some medicines can make you feel dizzy. This can increase your chance of falling. Ask your doctor what other things that you can do to help prevent falls. This information is not intended to replace advice given to you by your health care provider. Make sure you discuss any questions you have with your health care provider. Document Released: 03/16/2009 Document Revised: 10/26/2015 Document Reviewed: 06/24/2014 Elsevier Interactive Patient Education  2017 [employer].

## 2018-07-19 ENCOUNTER — Encounter: Payer: Self-pay | Admitting: Nurse Practitioner

## 2018-07-22 ENCOUNTER — Telehealth: Payer: Self-pay

## 2018-07-22 ENCOUNTER — Ambulatory Visit (INDEPENDENT_AMBULATORY_CARE_PROVIDER_SITE_OTHER): Payer: Medicare Other | Admitting: Nurse Practitioner

## 2018-07-22 ENCOUNTER — Other Ambulatory Visit: Payer: Self-pay

## 2018-07-22 ENCOUNTER — Encounter: Payer: Self-pay | Admitting: Nurse Practitioner

## 2018-07-22 VITALS — BP 131/75 | HR 72 | Temp 97.6°F | Ht 68.5 in | Wt 190.0 lb

## 2018-07-22 DIAGNOSIS — N183 Chronic kidney disease, stage 3 unspecified: Secondary | ICD-10-CM

## 2018-07-22 DIAGNOSIS — I130 Hypertensive heart and chronic kidney disease with heart failure and stage 1 through stage 4 chronic kidney disease, or unspecified chronic kidney disease: Secondary | ICD-10-CM

## 2018-07-22 DIAGNOSIS — K219 Gastro-esophageal reflux disease without esophagitis: Secondary | ICD-10-CM

## 2018-07-22 DIAGNOSIS — E782 Mixed hyperlipidemia: Secondary | ICD-10-CM

## 2018-07-22 DIAGNOSIS — I714 Abdominal aortic aneurysm, without rupture, unspecified: Secondary | ICD-10-CM

## 2018-07-22 MED ORDER — OMEPRAZOLE 20 MG PO CPDR: 20.0000 mg | DELAYED_RELEASE_CAPSULE | Freq: Every day | ORAL | 3 refills | Status: DC

## 2018-07-22 MED ORDER — DICLOFENAC SODIUM 1 % TD GEL: 4.0000 g | TRANSDERMAL | 1 refills | Status: DC | PRN

## 2018-07-22 NOTE — Assessment & Plan Note (Addendum)
Chronic, ongoing. Risks and benefits of low dose CT discussed- pt declined. Continue BP management as prescribed and monitor for symptoms.    

## 2018-07-22 NOTE — Assessment & Plan Note (Addendum)
Chronic, worsening. Nephrology consult sent by cardiology. Discussed disease process and progression. Follow-up in 6 months.

## 2018-07-22 NOTE — Assessment & Plan Note (Addendum)
Labs ordered- will assess medication need based on results. Recommend DASH diet- information provided. Follow-up in 6 months.

## 2018-07-22 NOTE — Telephone Encounter (Signed)
Prior authorization for Voltaren Gel 1% was initiated via [email or web address]. Key: [id number]

## 2018-07-22 NOTE — Patient Instructions (Addendum)
DASH Eating Plan  DASH stands for "Dietary Approaches to Stop Hypertension." The DASH eating plan is a healthy eating plan that has been shown to reduce high blood pressure (hypertension). It may also reduce your risk for type 2 diabetes, heart disease, and stroke. The DASH eating plan may also help with weight loss.  What are tips for following this plan?    General guidelines   Avoid eating more than 2,300 mg (milligrams) of salt (sodium) a day. If you have hypertension, you may need to reduce your sodium intake to 1,500 mg a day.   Limit alcohol intake to no more than 1 drink a day for nonpregnant women and 2 drinks a day for men. One drink equals 12 oz of beer, 5 oz of wine, or 1 oz of hard liquor.   Work with your health care provider to maintain a healthy body weight or to lose weight. Ask what an ideal weight is for you.   Get at least 30 minutes of exercise that causes your heart to beat faster (aerobic exercise) most days of the week. Activities may include walking, swimming, or biking.   Work with your health care provider or diet and nutrition specialist (dietitian) to adjust your eating plan to your individual calorie needs.  Reading food labels     Check food labels for the amount of sodium per serving. Choose foods with less than 5 percent of the Daily Value of sodium. Generally, foods with less than 300 mg of sodium per serving fit into this eating plan.   To find whole grains, look for the word "whole" as the first word in the ingredient list.  Shopping   Buy products labeled as "low-sodium" or "no salt added."   Buy fresh foods. Avoid canned foods and premade or frozen meals.  Cooking   Avoid adding salt when cooking. Use salt-free seasonings or herbs instead of table salt or sea salt. Check with your health care provider or pharmacist before using salt substitutes.   Do not fry foods. Cook foods using healthy methods such as baking, boiling, grilling, and broiling instead.   Cook with  heart-healthy oils, such as olive, canola, soybean, or sunflower oil.  Meal planning   Eat a balanced diet that includes:  ? 5 or more servings of fruits and vegetables each day. At each meal, try to fill half of your plate with fruits and vegetables.  ? Up to 6-8 servings of whole grains each day.  ? Less than 6 oz of lean meat, poultry, or fish each day. A 3-oz serving of meat is about the same size as a deck of cards. One egg equals 1 oz.  ? 2 servings of low-fat dairy each day.  ? A serving of nuts, seeds, or beans 5 times each week.  ? Heart-healthy fats. Healthy fats called Omega-3 fatty acids are found in foods such as flaxseeds and coldwater fish, like sardines, salmon, and mackerel.   Limit how much you eat of the following:  ? Canned or prepackaged foods.  ? Food that is high in trans fat, such as fried foods.  ? Food that is high in saturated fat, such as fatty meat.  ? Sweets, desserts, sugary drinks, and other foods with added sugar.  ? Full-fat dairy products.   Do not salt foods before eating.   Try to eat at least 2 vegetarian meals each week.   Eat more home-cooked food and less restaurant, buffet, and fast food.     When eating at a restaurant, ask that your food be prepared with less salt or no salt, if possible.  What foods are recommended?  The items listed may not be a complete list. Talk with your dietitian about what dietary choices are best for you.  Grains  Whole-grain or whole-wheat bread. Whole-grain or whole-wheat pasta. Brown rice. Oatmeal. Quinoa. Bulgur. Whole-grain and low-sodium cereals. Pita bread. Low-fat, low-sodium crackers. Whole-wheat flour tortillas.  Vegetables  Fresh or frozen vegetables (raw, steamed, roasted, or grilled). Low-sodium or reduced-sodium tomato and vegetable juice. Low-sodium or reduced-sodium tomato sauce and tomato paste. Low-sodium or reduced-sodium canned vegetables.  Fruits  All fresh, dried, or frozen fruit. Canned fruit in natural juice (without  added sugar).  Meat and other protein foods  Skinless chicken or turkey. Ground chicken or turkey. Pork with fat trimmed off. Fish and seafood. Egg whites. Dried beans, peas, or lentils. Unsalted nuts, nut butters, and seeds. Unsalted canned beans. Lean cuts of beef with fat trimmed off. Low-sodium, lean deli meat.  Dairy  Low-fat (1%) or fat-free (skim) milk. Fat-free, low-fat, or reduced-fat cheeses. Nonfat, low-sodium ricotta or cottage cheese. Low-fat or nonfat yogurt. Low-fat, low-sodium cheese.  Fats and oils  Soft margarine without trans fats. Vegetable oil. Low-fat, reduced-fat, or light mayonnaise and salad dressings (reduced-sodium). Canola, safflower, olive, soybean, and sunflower oils. Avocado.  Seasoning and other foods  Herbs. Spices. Seasoning mixes without salt. Unsalted popcorn and pretzels. Fat-free sweets.  What foods are not recommended?  The items listed may not be a complete list. Talk with your dietitian about what dietary choices are best for you.  Grains  Baked goods made with fat, such as croissants, muffins, or some breads. Dry pasta or rice meal packs.  Vegetables  Creamed or fried vegetables. Vegetables in a cheese sauce. Regular canned vegetables (not low-sodium or reduced-sodium). Regular canned tomato sauce and paste (not low-sodium or reduced-sodium). Regular tomato and vegetable juice (not low-sodium or reduced-sodium). Pickles. Olives.  Fruits  Canned fruit in a light or heavy syrup. Fried fruit. Fruit in cream or butter sauce.  Meat and other protein foods  Fatty cuts of meat. Ribs. Fried meat. Bacon. Sausage. Bologna and other processed lunch meats. Salami. Fatback. Hotdogs. Bratwurst. Salted nuts and seeds. Canned beans with added salt. Canned or smoked fish. Whole eggs or egg yolks. Chicken or turkey with skin.  Dairy  Whole or 2% milk, cream, and half-and-half. Whole or full-fat cream cheese. Whole-fat or sweetened yogurt. Full-fat cheese. Nondairy creamers. Whipped toppings.  Processed cheese and cheese spreads.  Fats and oils  Butter. Stick margarine. Lard. Shortening. Ghee. Bacon fat. Tropical oils, such as coconut, palm kernel, or palm oil.  Seasoning and other foods  Salted popcorn and pretzels. Onion salt, garlic salt, seasoned salt, table salt, and sea salt. Worcestershire sauce. Tartar sauce. Barbecue sauce. Teriyaki sauce. Soy sauce, including reduced-sodium. Steak sauce. Canned and packaged gravies. Fish sauce. Oyster sauce. Cocktail sauce. Horseradish that you find on the shelf. Ketchup. Mustard. Meat flavorings and tenderizers. Bouillon cubes. Hot sauce and Tabasco sauce. Premade or packaged marinades. Premade or packaged taco seasonings. Relishes. Regular salad dressings.  Where to find more information:   National Heart, Lung, and Blood Institute: www.nhlbi.nih.gov   American Heart Association: www.heart.org  Summary   The DASH eating plan is a healthy eating plan that has been shown to reduce high blood pressure (hypertension). It may also reduce your risk for type 2 diabetes, heart disease, and stroke.   With the   DASH eating plan, you should limit salt (sodium) intake to 2,300 mg a day. If you have hypertension, you may need to reduce your sodium intake to 1,500 mg a day.   When on the DASH eating plan, aim to eat more fresh fruits and vegetables, whole grains, lean proteins, low-fat dairy, and heart-healthy fats.   Work with your health care provider or diet and nutrition specialist (dietitian) to adjust your eating plan to your individual calorie needs.  This information is not intended to replace advice given to you by your health care provider. Make sure you discuss any questions you have with your health care provider.  Document Released: 05/09/2011 Document Revised: 05/13/2016 Document Reviewed: 05/13/2016  Elsevier Interactive Patient Education  2019 [employer]    Heartburn  Heartburn is a type of pain or discomfort that can happen in the throat or chest.  It is often described as a burning pain. It may also cause a bad, acid-like taste in the mouth. Heartburn may feel worse when you lie down or bend over. It may be worse at night. It may be caused by stomach contents that move back up (reflux) into the tube that connects the mouth with the stomach (esophagus).  Follow these instructions at home:  Eating and drinking     Avoid certain foods and drinks as told by your doctor. This may include:  ? Coffee and tea (with or without caffeine).  ? Drinks that have alcohol.  ? Energy drinks and sports drinks.  ? Carbonated drinks or sodas.  ? Chocolate and cocoa.  ? Peppermint and mint flavorings.  ? Garlic and onions.  ? Horseradish.  ? Spicy and acidic foods, such as:   Peppers.   Chili powder and curry powder.   Vinegar.   Hot sauces and BBQ sauce.  ? Citrus fruit juices and citrus fruits, such as:   Oranges.   Lemons.   Limes.  ? Tomato-based foods, such as:   Red sauce and pizza with red sauce.   Chili.   Salsa.  ? Fried and fatty foods, such as:   Donuts.   French fries and potato chips.   High-fat dressings.  ? High-fat meats, such as:   Hot dogs and sausage.   Rib eye steak.   Ham and bacon.  ? High-fat dairy items, such as:   Whole milk.   Butter.   Cream cheese.   Eat small meals often. Avoid eating large meals.   Avoid drinking large amounts of liquid with your meals.   Avoid eating meals during the 2-3 hours before bedtime.   Avoid lying down right after you eat.   Do not exercise right after you eat.  Lifestyle          If you are overweight, lose an amount of weight that is healthy for you. Ask your doctor about a safe weight loss goal.   Do not use any products that contain nicotine or tobacco, including cigarettes, e-cigarettes, and chewing tobacco. These can make your symptoms worse. If you need help quitting, ask your doctor.   Wear loose clothes. Do not wear anything tight around your waist.   Raise (elevate) the head of your  bed about 6 inches (15 cm) when you sleep.   Try to lower your stress. If you need help doing this, ask your doctor.  General instructions   Pay attention to any changes in your symptoms.   Take over-the-counter and prescription medicines only as told   by your doctor.  ? Do not take aspirin, ibuprofen, or other NSAIDs unless your doctor says it is okay.  ? Stop medicines only as told by your doctor.   Keep all follow-up visits as told by your doctor. This is important.  Contact a doctor if:   You have new symptoms.   You lose weight and you do not know why it is happening.   You have trouble swallowing, or it hurts to swallow.   You have wheezing or a cough that keeps happening.   Your symptoms do not get better with treatment.   You have heartburn often for more than 2 weeks.  Get help right away if:   You have pain in your arms, neck, jaw, teeth, or back.   You feel sweaty, dizzy, or light-headed.   You have chest pain or shortness of breath.   You throw up (vomit) and your throw up looks like blood or coffee grounds.   Your poop (stool) is bloody or black.  These symptoms may represent a serious problem that is an emergency. Do not wait to see if the symptoms will go away. Get medical help right away. Call your local emergency services (911 in the [place] Do not drive yourself to the hospital.  Summary   Heartburn is a type of pain that can happen in the throat or chest. It can feel like a burning pain. It may also cause a bad, acid-like taste in the mouth.   You may need to avoid certain foods and drinks to help your symptoms. Ask your doctor what foods and drinks you should avoid.   Take over-the-counter and prescription medicines only as told by your doctor. Do not take aspirin, ibuprofen, or other NSAIDs unless your doctor told you to do so.   Contact your doctor if your symptoms do not get better or they get worse.  This information is not intended to replace advice given to you by your  health care provider. Make sure you discuss any questions you have with your health care provider.  Document Released: 01/30/2011 Document Revised: 10/20/2017 Document Reviewed: 10/20/2017  [employer]vier Interactive Patient Education  2019 [employer]

## 2018-07-22 NOTE — Assessment & Plan Note (Addendum)
Omeprazole prescribed. To notify provider if sx do not improve. Follow-up in 6 months or PRN.

## 2018-07-22 NOTE — Assessment & Plan Note (Addendum)
Chronic, ongoing. BP stable. Continue medication regimen and collaboration cardiology. Referral to nephrology sent by cardiology. Follow-up in 6 months.

## 2018-07-22 NOTE — Progress Notes (Addendum)
BP 131/75   Pulse 72   Temp 97.6 F (36.4 C) (Oral)   Ht 5' 8.5" (1.74 m)   Wt 190 lb (86.2 kg)   SpO2 96%   BMI 28.47 kg/m    Subjective:    Patient ID: Roy Palmer, male    DOB: 01/18/1930, 85 y.o.   MRN: [id number]  HPI: Roy Palmer is a 85 y.o. male presenting on 07/22/2018 for comprehensive medical examination. Current medical complaints include: intermittent reflux symptoms with ingestion of spicy food.   He currently lives with: wife Interim Problems from his last visit: no   HYPERTENSION AND CHF  He is followed by cardiology and was last seen by PA Dunn on 06/12/2018.  In 2018 his EF was 25-30%.  On review of cardiology note patient is intolerant to all classes HF therapy.  At this time is on Norvasc and Pacerone + ASA.  He has a known abdominal aortic aneurysm noted on 2017 CT measuring 3.5 AP at that time. Discussed risks and benefits of low dose CT for AAA monitoring- pt declined at this time. Recommended DASH diet, education provided.  Hypertension status: controlled  Satisfied with current treatment? yes Duration of hypertension: chronic BP monitoring frequency:  monthly BP range: 120-130/80's BP medication side effects:  no Medication compliance: good compliance Aspirin: yes Recurrent headaches: no Visual changes: no Palpitations: no Dyspnea: no Chest pain: no Lower extremity edema: no Dizzy/lightheaded: no   CHRONIC KIDNEY DISEASE Discussed disease progression and avoidance of nephrotoxic drugs with patient.  He has a referral in place from cardiology to nephrology.  CKD status: worse Medications renally dose: yes Previous renal evaluation: referral for nephrology placed by cardiology Pneumovax:  Up to Date Influenza Vaccine:  Up to Date   HYPERLIPIDEMIA Recommended DASH diet, education provided.  Satisfied with current treatment?  no current medications Aspirin:  yes The ASCVD Risk score Denman George DC Jr., et al., 2013) failed to calculate for the  following reasons:   The 2013 ASCVD risk score is only valid for ages 37 to 55 Chest pain:  no Coronary artery disease:  yes Family history CAD:  yes Family history early CAD:  yes   GERD Education provided on diet and lifestyle changes to assist with symptoms.  GERD control status: intermittent control with diet   Satisfied with current treatment? pt reports OTC omeprazole usage- requesting prescription Heartburn frequency: "once or twice a month" Medication side effects: no  Medication compliance: fluctuating Previous GERD medications:OTC omeprazole Heartburn duration:"a few hours"  Alleviatiating factors: medication- omeprazole   Aggravating factors: spicy food Dysphagia: no Odynophagia:  no Hematemesis: no Blood in stool: no EGD: no  Functional Status Survey: Is the patient deaf or have difficulty hearing?: No Does the patient have difficulty seeing, even when wearing glasses/contacts?: No Does the patient have difficulty concentrating, remembering, or making decisions?: No Does the patient have difficulty walking or climbing stairs?: No Does the patient have difficulty dressing or bathing?: No Does the patient have difficulty doing errands alone such as visiting a doctor's office or shopping?: No  FALL RISK: Fall Risk  07/22/2018 07/08/2018 06/11/2017 06/24/2016 02/08/2016  Falls in the past year? 0 0 No No No  Number falls in past yr: 0 0 - - -  Injury with Fall? 0 - - - -    Depression Screen Depression screen [facility] 2/9 07/22/2018 07/08/2018 06/11/2017 06/24/2016  Decreased Interest 0 0 0 0  Down, Depressed, Hopeless 0 0 0 0  PHQ - 2 Score 0 0 0 0  Altered sleeping 0 - - 0  Tired, decreased energy 0 - - 0  Change in appetite 0 - - 0  Feeling bad or failure about yourself  0 - - 0  Trouble concentrating 0 - - 0  Moving slowly or fidgety/restless 0 - - 0  Suicidal thoughts 0 - - 0  PHQ-9 Score 0 - - 0    Advanced Directives <no information>  Past Medical History:  Past  Medical History:  Diagnosis Date  . AAA (abdominal aortic aneurysm) without rupture (HCC) 03/12/2016   Small - measured 3.3 x 3.5 cm by CTA  . CKD (chronic kidney disease) stage 3, GFR 30-59 ml/min (HCC)   . Essential hypertension   . Hyperlipidemia   . Hypertensive kidney disease   . Incidental pulmonary nodule 03/12/2016   Vague opacity right lung base requires radiographic follow up - index of suspicion is LOW  . Near syncope    Cardiogenic, related to an SVT and severe MR  . Non-sustained ventricular tachycardia (HCC)   . S/P minimally invasive mitral valve repair 03/19/2016   Complex valvuloplasty including triangular resection of flail segment of posterior leaflet, chordal transposition x1, artificial Gore-tex neochord placement x4 and 34 mm Sorin Memo 3D ring annuloplasty via right mini thoracotomy approach with clipping of LA appendage  . Severe mitral regurgitation by prior echocardiogram 02/23/2016   Confirmed by TEE  . Syncope 03/15/2016  . Thoracic aortic aneurysm (HCC) 03/12/2016   Very very mild fusiform enlargement of distal transverse and proximal descending thoracic aorta noted on CTA  . Trigeminal neuralgia     Surgical History:  Past Surgical History:  Procedure Laterality Date  . CARDIAC CATHETERIZATION N/A 02/23/2016   Procedure: Right/Left Heart Cath and Coronary Angiography;  Surgeon: Yvonne Kendallhristopher End, MD;  [facility];  Service: Cardiovascular: Angiographic minimal coronary disease. Normal left and right heart Pressures. Decreased CO/CI in settng of frequent PVCs & Severe MR.  Marland Kitchen. CATARACT EXTRACTION, BILATERAL    . COLONOSCOPY  08/2005  . CYST EXCISION  10/2013   from neck  . EP IMPLANTABLE DEVICE N/A 03/27/2016   Procedure: ICD Implant Dual Chamber;  Surgeon: Marinus MawGregg W Taylor, MD;  [facility];  Service: Cardiovascular;  Laterality: N/A;  . ESOPHAGOGASTRODUODENOSCOPY  02/2010  . MITRAL VALVE REPAIR Right 03/19/2016   Procedure:  MINIMALLY INVASIVE MITRAL VALVE REPAIR (MVR);  Surgeon: Purcell Nailslarence H Owen, MD;  [facility];  Service: Open Heart Surgery;  Laterality: Right;  . TEE WITHOUT CARDIOVERSION N/A 02/23/2016   Procedure: TRANSESOPHAGEAL ECHOCARDIOGRAM (TEE);  Surgeon: Quintella Reichertraci R Turner, MD;  [facility];  Service: Cardiovascular: Normal LV size and function.  Degenerative mitral valve disease with failed posterior leaflet (P2 segment), Severe MR with pulmonary vein systolic flow reversal. Moderate-TR.    . TEE WITHOUT CARDIOVERSION N/A 03/19/2016   Procedure: TRANSESOPHAGEAL ECHOCARDIOGRAM (TEE);  Surgeon: Purcell Nailslarence H Owen, MD;  [facility];  Service: Open Heart Surgery;  Laterality: N/A;  . TRANSTHORACIC ECHOCARDIOGRAM  02/14/2016   EF 50-55% with moderate LVH. Possible inferolateral hypokinesis. Moderate-severe MR with posterior leaflet prolapse, cannot rule out flail. Severe LA dilation. Moderate TR.    Medications:  Current Outpatient Medications on File Prior to Visit  Medication Sig  . amiodarone (PACERONE) 200 MG tablet Take one tablet by mouth daily Monday through Saturday.  DO NOT take on Sunday.  Marland Kitchen. amLODipine (NORVASC) 2.5 MG tablet   .  aspirin EC 81 MG tablet Take 1 tablet (81 mg total) by mouth daily.   No current facility-administered medications on file prior to visit.     Allergies:  Allergies  Allergen Reactions  . Amiodarone Other (See Comments)    Terrible dreams/nightmares in high dosing  . Lyrica [Pregabalin] Nausea And Vomiting    Social History:  Social History   Socioeconomic History  . Marital status: Married    Spouse name: Not on file  . Number of children: Not on file  . Years of education: Not on file  . Highest education level: Associate degree: academic program  Occupational History  . Occupation: retired  Engineer, production  . Financial resource strain: Not hard at all  . Food insecurity:    Worry: Never true    Inability: Never true  . Transportation needs:     Medical: No    Non-medical: No  Tobacco Use  . Smoking status: Never Smoker  . Smokeless tobacco: Never Used  Substance and Sexual Activity  . Alcohol use: No  . Drug use: No  . Sexual activity: Never  Lifestyle  . Physical activity:    Days per week: 0 days    Minutes per session: 0 min  . Stress: Not at all  Relationships  . Social connections:    Talks on phone: More than three times a week    Gets together: More than three times a week    Attends religious service: More than 4 times per year    Active member of club or organization: No    Attends meetings of clubs or organizations: Never    Relationship status: Married  . Intimate partner violence:    Fear of current or ex partner: No    Emotionally abused: No    Physically abused: No    Forced sexual activity: No  Other Topics Concern  . Not on file  Social History Narrative  . Not on file   Social History   Tobacco Use  Smoking Status Never Smoker  Smokeless Tobacco Never Used   Social History   Substance and Sexual Activity  Alcohol Use No    Family History:  Family History  Problem Relation Age of Onset  . Hypertension Mother   . Stroke Mother   . Heart disease Father   . Heart attack Father   . Dementia Sister   . Retinoblastoma Son   . COPD Neg Hx   . Cancer Neg Hx   . Diabetes Neg Hx     Past medical history, surgical history, medications, allergies, family history and social history reviewed with patient today and changes made to appropriate areas of the chart.   Review of Systems - see HPI All other ROS negative except what is listed above and in the HPI.      Objective:    BP 131/75   Pulse 72   Temp 97.6 F (36.4 C) (Oral)   Ht 5' 8.5" (1.74 m)   Wt 190 lb (86.2 kg)   SpO2 96%   BMI 28.47 kg/m   Wt Readings from Last 3 Encounters:  07/22/18 190 lb (86.2 kg)  07/08/18 189 lb 11.2 oz (86 kg)  06/12/18 192 lb (87.1 kg)    Physical Exam Vitals signs and nursing note  reviewed.  Constitutional:      Appearance: He is well-developed.  HENT:     Head: Normocephalic and atraumatic.     Right Ear: Hearing, tympanic membrane, ear  canal and external ear normal. No decreased hearing noted. No drainage.     Left Ear: Hearing, tympanic membrane, ear canal and external ear normal. No decreased hearing noted. No drainage.     Nose: Nose normal.     Right Sinus: No maxillary sinus tenderness or frontal sinus tenderness.     Left Sinus: No maxillary sinus tenderness or frontal sinus tenderness.     Mouth/Throat:     Pharynx: Uvula midline.  Eyes:     General: Lids are normal.        Right eye: No discharge.        Left eye: No discharge.     Conjunctiva/sclera: Conjunctivae normal.     Pupils: Pupils are equal, round, and reactive to light.  Neck:     Musculoskeletal: Normal range of motion and neck supple.     Thyroid: No thyromegaly.     Vascular: No carotid bruit or JVD.     Trachea: Trachea normal.  Cardiovascular:     Rate and Rhythm: Normal rate and regular rhythm.     Heart sounds: Normal heart sounds, S1 normal and S2 normal. No murmur. No gallop.   Pulmonary:     Effort: Pulmonary effort is normal.     Breath sounds: Normal breath sounds.  Abdominal:     General: Bowel sounds are normal.     Palpations: Abdomen is soft. There is no hepatomegaly or splenomegaly.  Genitourinary:    Penis: Normal.      Rectum: Normal.  Musculoskeletal: Normal range of motion.  Lymphadenopathy:     Cervical: No cervical adenopathy.  Skin:    General: Skin is warm and dry.     Capillary Refill: Capillary refill takes less than 2 seconds.     Findings: No rash.  Neurological:     Mental Status: He is alert and oriented to person, place, and time.     Deep Tendon Reflexes: Reflexes are normal and symmetric.     Reflex Scores:      Brachioradialis reflexes are 2+ on the right side and 2+ on the left side.      Patellar reflexes are 2+ on the right side and 2+ on  the left side. Psychiatric:        Mood and Affect: Mood normal.        Behavior: Behavior normal.        Thought Content: Thought content normal.        Judgment: Judgment normal.      Results for orders placed or performed in visit on 06/22/18  CUP PACEART REMOTE DEVICE CHECK  Result Value Ref Range   Date Time Interrogation Session [id number]    Pulse Generator Manufacturer MERM    Pulse Gen Model [id number] Evera MRI XT DR    Pulse Gen Serial Number [id number]    [facility]    Implantable Pulse Generator Type Implantable Cardiac Defibulator    Implantable Pulse Generator Implant Date [id number]    Implantable Lead [facility]    Implantable Lead Model 5076 CapSureFix Novus MRI SureScan    Implantable Lead Serial Number [id number]    Implantable Lead Implant Date [id number]    Implantable Lead Location Detail 1 APPENDAGE    Implantable Lead Location [id number]    Implantable Lead Manufacturer [facility]    Implantable Lead Model [id number] Sprint Quattro Secure S MRI SureScan    Implantable Lead Serial Number [id number]    Implantable Lead Implant Date [id number]  Implantable Lead Location Detail 1 APEX    Implantable Lead Location [id number]    Lead Channel Setting Sensing Sensitivity 0.3 mV   Lead Channel Setting Pacing Amplitude 2 V   Lead Channel Setting Pacing Pulse Width 0.4 ms   Lead Channel Setting Pacing Amplitude 2.5 V   Lead Channel Impedance Value 456 ohm   Lead Channel Sensing Intrinsic Amplitude 2.625 mV   Lead Channel Sensing Intrinsic Amplitude 2.625 mV   Lead Channel Pacing Threshold Amplitude 0.75 V   Lead Channel Pacing Threshold Pulse Width 0.4 ms   Lead Channel Impedance Value 456 ohm   Lead Channel Impedance Value 399 ohm   Lead Channel Sensing Intrinsic Amplitude 5.5 mV   Lead Channel Sensing Intrinsic Amplitude 5.5 mV   Lead Channel Pacing Threshold Amplitude 0.75 V   Lead Channel Pacing Threshold Pulse Width 0.4 ms   HighPow Impedance 72 ohm     Battery Status OK    Battery Remaining Longevity 90 mo   Battery Voltage 2.99 V   Brady Statistic RA Percent Paced 65.27 %   Brady Statistic RV Percent Paced 99.77 %   Brady Statistic AP VP Percent 65.39 %   Brady Statistic AS VP Percent 34.52 %   Brady Statistic AP VS Percent 0.02 %   Brady Statistic AS VS Percent 0.07 %      Assessment & Plan:   Problem List Items Addressed This Visit      Cardiovascular and Mediastinum   AAA (abdominal aortic aneurysm) without rupture (HCC)    Chronic, ongoing. Risks and benefits of low dose CT discussed- pt declined. Continue BP management as prescribed and monitor for symptoms.         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 BP stable. Continue medication regimen and collaboration cardiology. Referral to nephrology sent by cardiology. Follow-up in 6 months.       Relevant Medications   amLODipine (NORVASC) 2.5 MG tablet     Digestive   Gastroesophageal reflux disease    Omeprazole prescribed. To notify provider if sx do not improve. Follow-up in 6 months or PRN.         Genitourinary   CKD (chronic kidney disease), stage III (HCC)    Chronic, worsening. Nephrology consult sent by cardiology. Discussed disease process and progression. Follow-up in 6 months.      Relevant Orders   CBC with Differential/Platelet     Other   Hyperlipidemia    Labs ordered- will assess medication need based on results. Recommend DASH diet- information provided. Follow-up in 6 months.       Relevant Medications   amLODipine (NORVASC) 2.5 MG tablet   Other Relevant Orders   Lipid Panel w/o Chol/HDL Ratio       Discussed aspirin prophylaxis for myocardial infarction prevention and decision was made to continue ASA  LABORATORY TESTING:  Health maintenance labs ordered today as discussed above.    IMMUNIZATIONS:   -  Tdap: Tetanus vaccination status reviewed: deferred to next visit. - Influenza: Up to date - Pneumovax: Up to date - Prevnar: Up to date - Zostavax vaccine: Up to date  SCREENING: - AAA Screening: declined  -Hearing Test: Up to date  -Spirometry: Not applicable   PATIENT COUNSELING:    Advised to avoid cigarette smoking.  I discussed with the patient that most people  either abstain from alcohol or drink within safe limits (<=14/week and <=4 drinks/occasion for males, <=7/weeks and <= 3 drinks/occasion for females) and that the risk for alcohol disorders and other health effects rises proportionally with the number of drinks per week and how often a drinker exceeds daily limits.  Discussed cessation/primary prevention of drug use and availability of treatment for abuse.   Diet: Encouraged to adjust caloric intake to maintain  or achieve ideal body weight, to reduce intake of dietary saturated fat and total fat, to limit sodium intake by avoiding high sodium foods and not adding table salt, and to maintain adequate dietary potassium and calcium preferably from fresh fruits, vegetables, and low-fat dairy products.    stressed the importance of regular exercise  Injury prevention: Discussed safety belts, safety helmets, smoke detector, smoking near bedding or upholstery.   Dental health: Discussed importance of regular tooth brushing, flossing, and dental visits.   Follow up plan: NEXT PREVENTATIVE PHYSICAL DUE IN 1 YEAR. Return in about 6 months (around 01/20/2019) for HLD, HTN, CHF.   NOTE WRITTEN BY [employer] DNP STUDENT.  ASSESSMENT AND PLAN OF CARE REVIEWED WITH STUDENT, AGREE WITH ABOVE FINDINGS AND PLAN.

## 2018-07-23 LAB — CBC WITH DIFFERENTIAL/PLATELET
Basophils Absolute: 0 10*3/uL (ref 0.0–0.2)
Basos: 1 %
EOS (ABSOLUTE): 0.2 10*3/uL (ref 0.0–0.4)
Eos: 3 %
Hematocrit: 46.7 % (ref 37.5–51.0)
Hemoglobin: 15.4 g/dL (ref 13.0–17.7)
Immature Grans (Abs): 0 10*3/uL (ref 0.0–0.1)
Immature Granulocytes: 0 %
Lymphocytes Absolute: 1.3 10*3/uL (ref 0.7–3.1)
Lymphs: 19 %
MCH: 30.6 pg (ref 26.6–33.0)
MCHC: 33 g/dL (ref 31.5–35.7)
MCV: 93 fL (ref 79–97)
Monocytes Absolute: 0.5 10*3/uL (ref 0.1–0.9)
Monocytes: 7 %
Neutrophils Absolute: 4.8 10*3/uL (ref 1.4–7.0)
Neutrophils: 70 %
Platelets: 195 10*3/uL (ref 150–450)
RBC: 5.03 x10E6/uL (ref 4.14–5.80)
RDW: 13 % (ref 11.6–15.4)
WBC: 6.9 10*3/uL (ref 3.4–10.8)

## 2018-07-23 LAB — LIPID PANEL W/O CHOL/HDL RATIO
Cholesterol, Total: 206 mg/dL — ABNORMAL HIGH (ref 100–199)
HDL: 41 mg/dL (ref >39)
LDL Calculated: 120 mg/dL — ABNORMAL HIGH (ref 0–99)
Triglycerides: 227 mg/dL — ABNORMAL HIGH (ref 0–149)
VLDL Cholesterol Cal: 45 mg/dL — ABNORMAL HIGH (ref 5–40)

## 2018-07-23 NOTE — Telephone Encounter (Signed)
Thank you.  Please let patient know.  Also let him know FDA approved this week for Voltaren to be over the counter, so most likely in upcoming months he will be able to purchase this over the counter.  At this time continue Biofreeze and Tylenol as needed.

## 2018-07-23 NOTE — Telephone Encounter (Signed)
Prior Authorization denied.  Reasoning is the medicare advantage plan does not cover drug.  Routing to provider.

## 2018-07-24 NOTE — Telephone Encounter (Signed)
Patient notified and verbalized understanding. 

## 2018-08-16 ENCOUNTER — Other Ambulatory Visit: Payer: Self-pay | Admitting: Internal Medicine

## 2018-09-02 ENCOUNTER — Other Ambulatory Visit: Payer: Self-pay | Admitting: Internal Medicine

## 2018-09-02 DIAGNOSIS — Z95810 Presence of automatic (implantable) cardiac defibrillator: Secondary | ICD-10-CM

## 2018-09-02 DIAGNOSIS — I472 Ventricular tachycardia, unspecified: Secondary | ICD-10-CM

## 2018-09-02 DIAGNOSIS — I5022 Chronic systolic (congestive) heart failure: Secondary | ICD-10-CM

## 2018-09-02 DIAGNOSIS — I428 Other cardiomyopathies: Secondary | ICD-10-CM

## 2018-09-04 ENCOUNTER — Telehealth: Payer: Self-pay

## 2018-09-04 NOTE — Telephone Encounter (Signed)
Spoke with the pt. Pt sts that he was just contacted by the pharmacy and was told that they have what was needed and will go ahead and fill his Amiodarone. Pt sts that he thinks it is all taken care of. Adv him to contact the office if assistance is needed. No further action needed.

## 2018-09-04 NOTE — Telephone Encounter (Signed)
Please call regarding Amlodipine. Pt states insurance will not pay for this medication.

## 2018-09-07 ENCOUNTER — Telehealth: Payer: Self-pay

## 2018-09-07 NOTE — Telephone Encounter (Signed)
This encounter was opened in Error.

## 2018-09-21 ENCOUNTER — Ambulatory Visit (INDEPENDENT_AMBULATORY_CARE_PROVIDER_SITE_OTHER): Payer: Medicare Other | Admitting: *Deleted

## 2018-09-21 ENCOUNTER — Other Ambulatory Visit: Payer: Self-pay

## 2018-09-21 DIAGNOSIS — I5022 Chronic systolic (congestive) heart failure: Secondary | ICD-10-CM

## 2018-09-21 DIAGNOSIS — I428 Other cardiomyopathies: Secondary | ICD-10-CM

## 2018-09-21 LAB — CUP PACEART REMOTE DEVICE CHECK
Battery Remaining Longevity: 82 mo
Battery Voltage: 2.99 V
Brady Statistic AP VP Percent: 79.75 %
Brady Statistic AP VS Percent: 0.1 %
Brady Statistic AS VP Percent: 19.95 %
Brady Statistic AS VS Percent: 0.2 %
Brady Statistic RA Percent Paced: 79.64 %
Brady Statistic RV Percent Paced: 99.54 %
Date Time Interrogation Session: 20200420165739
HighPow Impedance: 73 Ohm
Implantable Lead Implant Date: 20171025
Implantable Lead Implant Date: 20171025
Implantable Lead Location: 753859
Implantable Lead Location: 753860
Implantable Lead Model: 5076
Implantable Pulse Generator Implant Date: 20171025
Lead Channel Impedance Value: 399 Ohm
Lead Channel Impedance Value: 456 Ohm
Lead Channel Impedance Value: 456 Ohm
Lead Channel Pacing Threshold Amplitude: 0.625 V
Lead Channel Pacing Threshold Amplitude: 1 V
Lead Channel Pacing Threshold Pulse Width: 0.4 ms
Lead Channel Pacing Threshold Pulse Width: 0.4 ms
Lead Channel Sensing Intrinsic Amplitude: 2.5 mV
Lead Channel Sensing Intrinsic Amplitude: 2.5 mV
Lead Channel Sensing Intrinsic Amplitude: 5.5 mV
Lead Channel Sensing Intrinsic Amplitude: 5.5 mV
Lead Channel Setting Pacing Amplitude: 2 V
Lead Channel Setting Pacing Amplitude: 2.5 V
Lead Channel Setting Pacing Pulse Width: 0.4 ms
Lead Channel Setting Sensing Sensitivity: 0.3 mV

## 2018-09-30 ENCOUNTER — Encounter: Payer: Self-pay | Admitting: Cardiology

## 2018-09-30 NOTE — Progress Notes (Signed)
Remote ICD transmission.   

## 2018-10-01 ENCOUNTER — Telehealth (INDEPENDENT_AMBULATORY_CARE_PROVIDER_SITE_OTHER): Payer: Medicare Other | Admitting: Internal Medicine

## 2018-10-01 ENCOUNTER — Other Ambulatory Visit: Payer: Self-pay

## 2018-10-01 DIAGNOSIS — I5022 Chronic systolic (congestive) heart failure: Secondary | ICD-10-CM

## 2018-10-01 DIAGNOSIS — I472 Ventricular tachycardia, unspecified: Secondary | ICD-10-CM

## 2018-10-01 NOTE — Progress Notes (Signed)
Electrophysiology TeleHealth Note   Due to national recommendations of social distancing due to COVID 19, an audio/video telehealth visit is felt to be most appropriate for this patient at this time.  See MyChart message from today for the patient's consent to telehealth for Roy Palmer.   Date:  10/01/2018   ID:  Roy Palmer, DOB 1930-02-11, MRN [id number]  Location: patient's home  Provider location: [address], [place]  Evaluation Performed: Follow-up visit  PCP:  Roy Skiff, NP  Cardiologist:  No primary care provider on file. Dr. Okey Palmer Electrophysiologist:  Dr Roy Palmer  Chief Complaint:  "I've been moving kind of slow"  History of Present Illness:    Roy Palmer is a 85 y.o. male who presents via audio/video conferencing for a telehealth visit today.  He is a pleasant 85 yo man with a h/o MV disease, s/p ICD insertion after developing post op VT. He also has heart block and is s/p DDD ICD. He has refused to consider upgrade to a biv ICD. He remains active but notes that he has been moving slower. He also notes some trouble moving his legs. His VT has been well controlled on amiodarone. Today, he denies symptoms of palpitations, chest pain, shortness of breath,  lower extremity edema, dizziness, presyncope, or syncope.  The patient is otherwise without complaint today.  The patient denies symptoms of fevers, chills, cough, or new SOB worrisome for COVID 19.  Past Medical History:  Diagnosis Date  . AAA (abdominal aortic aneurysm) without rupture (HCC) 03/12/2016   Small - measured 3.3 x 3.5 cm by CTA  . CKD (chronic kidney disease) stage 3, GFR 30-59 ml/min (HCC)   . Essential hypertension   . Hyperlipidemia   . Hypertensive kidney disease   . Incidental pulmonary nodule 03/12/2016   Vague opacity right lung base requires radiographic follow up - index of suspicion is LOW  . Near syncope    Cardiogenic, related to an SVT and severe MR  .  Non-sustained ventricular tachycardia (HCC)   . S/P minimally invasive mitral valve repair 03/19/2016   Complex valvuloplasty including triangular resection of flail segment of posterior leaflet, chordal transposition x1, artificial Gore-tex neochord placement x4 and 34 mm Sorin Memo 3D ring annuloplasty via right mini thoracotomy approach with clipping of LA appendage  . Severe mitral regurgitation by prior echocardiogram 02/23/2016   Confirmed by TEE  . Syncope 03/15/2016  . Thoracic aortic aneurysm (HCC) 03/12/2016   Very very mild fusiform enlargement of distal transverse and proximal descending thoracic aorta noted on CTA  . Trigeminal neuralgia     Past Surgical History:  Procedure Laterality Date  . CARDIAC CATHETERIZATION N/A 02/23/2016   Procedure: Right/Left Heart Cath and Coronary Angiography;  Surgeon: [name], MD;  Location: [facility] INVASIVE CV LAB;  Service: Cardiovascular: Angiographic minimal coronary disease. Normal left and right heart Pressures. Decreased CO/CI in settng of frequent PVCs & Severe MR.  Marland Kitchen CATARACT EXTRACTION, BILATERAL    . COLONOSCOPY  08/2005  . CYST EXCISION  10/2013   from neck  . EP IMPLANTABLE DEVICE N/A 03/27/2016   Procedure: ICD Implant Dual Chamber;  Surgeon: Marinus Maw, MD;  Location: [facility] LAB;  Service: Cardiovascular;  Laterality: N/A;  . ESOPHAGOGASTRODUODENOSCOPY  02/2010  . MITRAL VALVE REPAIR Right 03/19/2016   Procedure: MINIMALLY INVASIVE MITRAL VALVE REPAIR (MVR);  Surgeon: [name], MD;  Location: [facility];  Service: Open Heart Surgery;  Laterality: Right;  . TEE WITHOUT CARDIOVERSION N/A 02/23/2016   Procedure: TRANSESOPHAGEAL ECHOCARDIOGRAM (TEE);  Surgeon: Quintella Reichert, MD;  Location: [facility] ENDOSCOPY;  Service: Cardiovascular: Normal LV size and function.  Degenerative mitral valve disease with failed posterior leaflet (P2 segment), Severe MR with pulmonary vein systolic flow reversal. Moderate-TR.    . TEE WITHOUT  CARDIOVERSION N/A 03/19/2016   Procedure: TRANSESOPHAGEAL ECHOCARDIOGRAM (TEE);  Surgeon: [name], MD;  Location: [facility];  Service: Open Heart Surgery;  Laterality: N/A;  . TRANSTHORACIC ECHOCARDIOGRAM  02/14/2016   EF 50-55% with moderate LVH. Possible inferolateral hypokinesis. Moderate-severe MR with posterior leaflet prolapse, cannot rule out flail. Severe LA dilation. Moderate TR.    Current Outpatient Medications  Medication Sig Dispense Refill  . amiodarone (PACERONE) 200 MG tablet TAKE 1 TABLET BY MOUTH DAILY MONDAY THROUGH SATURDAY. DO NOT TAKE ON SUNDAYS 90 tablet 0  . amLODipine (NORVASC) 2.5 MG tablet TAKE 1 TABLET(2.5 MG) BY MOUTH DAILY 30 tablet 4  . aspirin EC 81 MG tablet Take 1 tablet (81 mg total) by mouth daily.    . diclofenac sodium (VOLTAREN) 1 % GEL Apply 4 g topically as needed (apply 3 times a day as needed to affected areas). 1 Tube 1  . omeprazole (PRILOSEC) 20 MG capsule Take 1 capsule (20 mg total) by mouth daily. 30 capsule 3   No current facility-administered medications for this visit.     Allergies:   Amiodarone and Lyrica [pregabalin]   Social History:  The patient  reports that he has never smoked. He has never used smokeless tobacco. He reports that he does not drink alcohol or use drugs.   Family History:  The patient's  family history includes Dementia in his sister; Heart attack in his father; Heart disease in his father; Hypertension in his mother; Retinoblastoma in his son; Stroke in his mother.   ROS:  Please see the history of present illness.   All other systems are personally reviewed and negative.    Exam:    Vital Signs:  There were no vitals taken for this visit.   Labs/Other Tests and Data Reviewed:    Recent Labs: 06/12/2018: ALT 20; TSH 1.330 06/19/2018: BUN 29; Creatinine, Ser 1.84; Potassium 4.4; Sodium 136 07/22/2018: Hemoglobin 15.4; Platelets 195   Wt Readings from Last 3 Encounters:  07/22/18 190 lb (86.2 kg)   07/08/18 189 lb 11.2 oz (86 kg)  06/12/18 192 lb (87.1 kg)     Other studies personally reviewed:  Last device remote is reviewed from PaceART PDF dated 4/20 which reveals normal device function, no arrhythmias    ASSESSMENT & PLAN:    1.  VT - he has maintained NSR for several months since starting amiodarone. He will continue his current dose of 200 mg daily, none on Sunday. 2. Chronic systolic heart failure - his symptoms are class 2. He is able to fish and walk. He will continue his current meds. If his symptoms were to worsen much, I would push him to upgrade to a Biv device. 3. CHB - he is pacing almost 100% in the ventricle. 4.COVID 19 screen The patient denies symptoms of COVID 19 at this time.  The importance of social distancing was discussed today.  Follow-up:  6 months with me Next remote: 3 months  Current medicines are reviewed at length with the patient today.   The patient does not have concerns regarding his medicines.  The following changes were made today:  none  Labs/ tests ordered today include: none No orders of the defined types were placed in this encounter.    Patient Risk:  after full review of this patients clinical status, I feel that they are at moderate risk at this time.  Today, I have spent 25 minutes with the patient with telehealth technology discussing all of above.    Signed, Lewayne Bunting, MD  10/01/2018 11:06 AM     [facility] [address] [place] [address] [phone] (office) [phone] (fax)

## 2018-11-14 ENCOUNTER — Other Ambulatory Visit: Payer: Self-pay | Admitting: Nurse Practitioner

## 2018-12-01 ENCOUNTER — Other Ambulatory Visit: Payer: Self-pay | Admitting: Internal Medicine

## 2018-12-01 DIAGNOSIS — I428 Other cardiomyopathies: Secondary | ICD-10-CM

## 2018-12-01 DIAGNOSIS — Z95810 Presence of automatic (implantable) cardiac defibrillator: Secondary | ICD-10-CM

## 2018-12-01 DIAGNOSIS — I5022 Chronic systolic (congestive) heart failure: Secondary | ICD-10-CM

## 2018-12-01 DIAGNOSIS — I472 Ventricular tachycardia, unspecified: Secondary | ICD-10-CM

## 2018-12-21 ENCOUNTER — Ambulatory Visit (INDEPENDENT_AMBULATORY_CARE_PROVIDER_SITE_OTHER): Payer: Medicare Other | Admitting: *Deleted

## 2018-12-21 DIAGNOSIS — I5022 Chronic systolic (congestive) heart failure: Secondary | ICD-10-CM

## 2018-12-21 DIAGNOSIS — I428 Other cardiomyopathies: Secondary | ICD-10-CM

## 2018-12-22 LAB — CUP PACEART REMOTE DEVICE CHECK
Battery Remaining Longevity: 73 mo
Battery Voltage: 2.98 V
Brady Statistic AP VP Percent: 82.06 %
Brady Statistic AP VS Percent: 0.02 %
Brady Statistic AS VP Percent: 17.89 %
Brady Statistic AS VS Percent: 0.03 %
Brady Statistic RA Percent Paced: 81.95 %
Brady Statistic RV Percent Paced: 99.84 %
Date Time Interrogation Session: 20200720211657
HighPow Impedance: 72 Ohm
Implantable Lead Implant Date: 20171025
Implantable Lead Implant Date: 20171025
Implantable Lead Location: 753859
Implantable Lead Location: 753860
Implantable Lead Model: 5076
Implantable Pulse Generator Implant Date: 20171025
Lead Channel Impedance Value: 399 Ohm
Lead Channel Impedance Value: 418 Ohm
Lead Channel Impedance Value: 418 Ohm
Lead Channel Pacing Threshold Amplitude: 0.75 V
Lead Channel Pacing Threshold Amplitude: 0.75 V
Lead Channel Pacing Threshold Pulse Width: 0.4 ms
Lead Channel Pacing Threshold Pulse Width: 0.4 ms
Lead Channel Sensing Intrinsic Amplitude: 2.125 mV
Lead Channel Sensing Intrinsic Amplitude: 2.125 mV
Lead Channel Sensing Intrinsic Amplitude: 5.5 mV
Lead Channel Sensing Intrinsic Amplitude: 5.5 mV
Lead Channel Setting Pacing Amplitude: 2 V
Lead Channel Setting Pacing Amplitude: 2.5 V
Lead Channel Setting Pacing Pulse Width: 0.4 ms
Lead Channel Setting Sensing Sensitivity: 0.3 mV

## 2019-01-06 ENCOUNTER — Encounter: Payer: Self-pay | Admitting: Cardiology

## 2019-01-06 NOTE — Progress Notes (Signed)
Remote ICD transmission.   

## 2019-01-12 ENCOUNTER — Other Ambulatory Visit: Payer: Self-pay | Admitting: Internal Medicine

## 2019-01-20 ENCOUNTER — Other Ambulatory Visit: Payer: Self-pay

## 2019-01-20 ENCOUNTER — Ambulatory Visit: Payer: Medicare Other | Admitting: Nurse Practitioner

## 2019-01-20 ENCOUNTER — Encounter: Payer: Self-pay | Admitting: Nurse Practitioner

## 2019-01-20 VITALS — BP 112/68 | HR 60 | Temp 97.5°F | Ht 68.5 in | Wt 188.0 lb

## 2019-01-20 DIAGNOSIS — Z23 Encounter for immunization: Secondary | ICD-10-CM

## 2019-01-20 DIAGNOSIS — R7301 Impaired fasting glucose: Secondary | ICD-10-CM | POA: Diagnosis not present

## 2019-01-20 DIAGNOSIS — E782 Mixed hyperlipidemia: Secondary | ICD-10-CM

## 2019-01-20 DIAGNOSIS — I130 Hypertensive heart and chronic kidney disease with heart failure and stage 1 through stage 4 chronic kidney disease, or unspecified chronic kidney disease: Secondary | ICD-10-CM | POA: Diagnosis not present

## 2019-01-20 DIAGNOSIS — N183 Chronic kidney disease, stage 3 unspecified: Secondary | ICD-10-CM

## 2019-01-20 DIAGNOSIS — I5022 Chronic systolic (congestive) heart failure: Secondary | ICD-10-CM | POA: Diagnosis not present

## 2019-01-20 LAB — BAYER DCA HB A1C WAIVED: HB A1C (BAYER DCA - WAIVED): 5.8 % (ref <7.0)

## 2019-01-20 NOTE — Assessment & Plan Note (Signed)
Continue collaboration with cardiology and review recommendations.

## 2019-01-20 NOTE — Patient Instructions (Signed)
Carbohydrate Counting for Diabetes Mellitus, Adult  Carbohydrate counting is a method of keeping track of how many carbohydrates you eat. Eating carbohydrates naturally increases the amount of sugar (glucose) in the blood. Counting how many carbohydrates you eat helps keep your blood glucose within normal limits, which helps you manage your diabetes (diabetes mellitus). It is important to know how many carbohydrates you can safely have in each meal. This is different for every person. A diet and nutrition specialist (registered dietitian) can help you make a meal plan and calculate how many carbohydrates you should have at each meal and snack. Carbohydrates are found in the following foods:  Grains, such as breads and cereals.  Dried beans and soy products.  Starchy vegetables, such as potatoes, peas, and corn.  Fruit and fruit juices.  Milk and yogurt.  Sweets and snack foods, such as cake, cookies, candy, chips, and soft drinks. How do I count carbohydrates? There are two ways to count carbohydrates in food. You can use either of the methods or a combination of both. Reading "Nutrition Facts" on packaged food The "Nutrition Facts" list is included on the labels of almost all packaged foods and beverages in the [place] It includes:  The serving size.  Information about nutrients in each serving, including the grams (g) of carbohydrate per serving. To use the "Nutrition Facts":  Decide how many servings you will have.  Multiply the number of servings by the number of carbohydrates per serving.  The resulting number is the total amount of carbohydrates that you will be having. Learning standard serving sizes of other foods When you eat carbohydrate foods that are not packaged or do not include "Nutrition Facts" on the label, you need to measure the servings in order to count the amount of carbohydrates:  Measure the foods that you will eat with a food scale or measuring cup, if needed.   Decide how many standard-size servings you will eat.  Multiply the number of servings by 15. Most carbohydrate-rich foods have about 15 g of carbohydrates per serving. ? For example, if you eat 8 oz (170 g) of strawberries, you will have eaten 2 servings and 30 g of carbohydrates (2 servings x 15 g = 30 g).  For foods that have more than one food mixed, such as soups and casseroles, you must count the carbohydrates in each food that is included. The following list contains standard serving sizes of common carbohydrate-rich foods. Each of these servings has about 15 g of carbohydrates:   hamburger bun or  English muffin.   oz (15 mL) syrup.   oz (14 g) jelly.  1 slice of bread.  1 six-inch tortilla.  3 oz (85 g) cooked rice or pasta.  4 oz (113 g) cooked dried beans.  4 oz (113 g) starchy vegetable, such as peas, corn, or potatoes.  4 oz (113 g) hot cereal.  4 oz (113 g) mashed potatoes or  of a large baked potato.  4 oz (113 g) canned or frozen fruit.  4 oz (120 mL) fruit juice.  4-6 crackers.  6 chicken nuggets.  6 oz (170 g) unsweetened dry cereal.  6 oz (170 g) plain fat-free yogurt or yogurt sweetened with artificial sweeteners.  8 oz (240 mL) milk.  8 oz (170 g) fresh fruit or one small piece of fruit.  24 oz (680 g) popped popcorn. Example of carbohydrate counting Sample meal  3 oz (85 g) chicken breast.  6 oz (170 g)   brown rice.  4 oz (113 g) corn.  8 oz (240 mL) milk.  8 oz (170 g) strawberries with sugar-free whipped topping. Carbohydrate calculation 1. Identify the foods that contain carbohydrates: ? Rice. ? Corn. ? Milk. ? Strawberries. 2. Calculate how many servings you have of each food: ? 2 servings rice. ? 1 serving corn. ? 1 serving milk. ? 1 serving strawberries. 3. Multiply each number of servings by 15 g: ? 2 servings rice x 15 g = 30 g. ? 1 serving corn x 15 g = 15 g. ? 1 serving milk x 15 g = 15 g. ? 1 serving  strawberries x 15 g = 15 g. 4. Add together all of the amounts to find the total grams of carbohydrates eaten: ? 30 g + 15 g + 15 g + 15 g = 75 g of carbohydrates total. Summary  Carbohydrate counting is a method of keeping track of how many carbohydrates you eat.  Eating carbohydrates naturally increases the amount of sugar (glucose) in the blood.  Counting how many carbohydrates you eat helps keep your blood glucose within normal limits, which helps you manage your diabetes.  A diet and nutrition specialist (registered dietitian) can help you make a meal plan and calculate how many carbohydrates you should have at each meal and snack. This information is not intended to replace advice given to you by your health care provider. Make sure you discuss any questions you have with your health care provider. Document Released: 05/20/2005 Document Revised: 12/12/2016 Document Reviewed: 11/01/2015 Elsevier Patient Education  2020 Elsevier Inc.  

## 2019-01-20 NOTE — Progress Notes (Signed)
BP 112/68   Pulse 60   Temp (!) 97.5 F (36.4 C) (Oral)   Ht 5' 8.5" (1.74 m)   Wt 188 lb (85.3 kg)   SpO2 97%   BMI 28.17 kg/m    Subjective:    Patient ID: Roy Palmer, male    DOB: 01/25/1930, 85 y.o.   MRN: [id number]  HPI: Roy Palmer is a 85 y.o. male  Chief Complaint  Patient presents with  . Hypertension  . Hyperlipidemia   HYPERTENSION / HYPERLIPIDEMIA WITH CHF He is followed by cardiology and was last seen by Dr. Ladona Ridgelaylor on 10/01/18.  In 2018 his EF was 25-30%.  On review of cardiology note patient is intolerant to all classes HF therapy.  At this time is on Norvasc and Pacerone + ASA.  He has a known abdominal aortic aneurysm noted on 2017 CT measuring 3.5 AP at that time. Discussed risks and benefits of low dose CT for AAA monitoring- pt declined at this time. Recommended DASH diet, education provided. Currently taking Amiodarone, ASA, Amlodipine. Not taking statin. Satisfied with current treatment? yes Duration of hypertension: chronic BP monitoring frequency: rarely BP range:  BP medication side effects: no Duration of hyperlipidemia: chronic Cholesterol medication side effects: no Cholesterol supplements: none Aspirin: yes Recent stressors: no Recurrent headaches: no Visual changes: no Palpitations: no Dyspnea: no Chest pain: no Lower extremity edema: no Dizzy/lightheaded: no   CHRONIC KIDNEY DISEASE III Has been seen by nephrology, last in March.   CKD status: stable Medications renally dose: no Previous renal evaluation: yes Pneumovax:  Up to Date Influenza Vaccine:  Up to Date   IFG: On labs glucose range from 101 to 172 over past two years.  In 2017 A1C 5.8.  He remains diet controlled.   Hypoglycemic episodes:no Polydipsia/polyuria: no Visual disturbance: no Chest pain: no Paresthesias: no  Relevant past medical, surgical, family and social history reviewed and updated as indicated. Interim medical history since our last visit  reviewed. Allergies and medications reviewed and updated.  Review of Systems  Constitutional: Negative for activity change, diaphoresis, fatigue and fever.  Respiratory: Negative for cough, chest tightness, shortness of breath and wheezing.   Cardiovascular: Negative for chest pain, palpitations and leg swelling.  Gastrointestinal: Negative for abdominal distention, abdominal pain, constipation, diarrhea, nausea and vomiting.  Endocrine: Negative for cold intolerance, heat intolerance, polydipsia, polyphagia and polyuria.  Neurological: Negative for dizziness, syncope, weakness, light-headedness, numbness and headaches.  Psychiatric/Behavioral: Negative.     Per HPI unless specifically indicated above     Objective:    BP 112/68   Pulse 60   Temp (!) 97.5 F (36.4 C) (Oral)   Ht 5' 8.5" (1.74 m)   Wt 188 lb (85.3 kg)   SpO2 97%   BMI 28.17 kg/m   Wt Readings from Last 3 Encounters:  01/20/19 188 lb (85.3 kg)  07/22/18 190 lb (86.2 kg)  07/08/18 189 lb 11.2 oz (86 kg)    Physical Exam Vitals signs and nursing note reviewed.  Constitutional:      General: He is awake. He is not in acute distress.    Appearance: He is well-developed. He is not ill-appearing.  HENT:     Head: Normocephalic and atraumatic.     Right Ear: Hearing normal. No drainage.     Left Ear: Hearing normal. No drainage.  Eyes:     General: Lids are normal.        Right eye: No discharge.  Left eye: No discharge.     Conjunctiva/sclera: Conjunctivae normal.     Pupils: Pupils are equal, round, and reactive to light.  Neck:     Musculoskeletal: Normal range of motion and neck supple.     Thyroid: No thyromegaly.     Vascular: No carotid bruit.  Cardiovascular:     Rate and Rhythm: Normal rate and regular rhythm.     Heart sounds: Normal heart sounds, S1 normal and S2 normal. No murmur. No gallop.   Pulmonary:     Effort: Pulmonary effort is normal. No accessory muscle usage or respiratory  distress.     Breath sounds: Normal breath sounds.  Abdominal:     General: Bowel sounds are normal.     Palpations: Abdomen is soft.  Musculoskeletal: Normal range of motion.     Right lower leg: No edema.     Left lower leg: No edema.  Skin:    General: Skin is warm and dry.  Neurological:     Mental Status: He is alert and oriented to person, place, and time.  Psychiatric:        Attention and Perception: Attention normal.        Mood and Affect: Mood normal.        Speech: Speech normal.        Behavior: Behavior normal. Behavior is cooperative.        Thought Content: Thought content normal.        Judgment: Judgment normal.     Results for orders placed or performed in visit on 12/21/18  CUP PACEART REMOTE DEVICE CHECK  Result Value Ref Range   Date Time Interrogation Session [id number]    Pulse Generator Manufacturer MERM    Pulse Gen Model [id number] Evera MRI XT DR    Pulse Gen Serial Number [id number]    Clinic Name [facility]    Implantable Pulse Generator Type Implantable Cardiac Defibulator    Implantable Pulse Generator Implant Date [id number]    Implantable Lead Manufacturer MERM    Implantable Lead Model 5076 CapSureFix Novus MRI SureScan    Implantable Lead Serial Number [id number]    Implantable Lead Implant Date [id number]    Implantable Lead Location Detail 1 APPENDAGE    Implantable Lead Location [id number]    Implantable Lead Manufacturer [facility]    Implantable Lead Model [id number] Sprint Quattro Secure S MRI SureScan    Implantable Lead Serial Number [id number]    Implantable Lead Implant Date [id number]    Implantable Lead Location Detail 1 APEX    Implantable Lead Location [id number]    Lead Channel Setting Sensing Sensitivity 0.3 mV   Lead Channel Setting Pacing Amplitude 2 V   Lead Channel Setting Pacing Pulse Width 0.4 ms   Lead Channel Setting Pacing Amplitude 2.5 V   Lead Channel Impedance Value 418 ohm   Lead Channel Sensing Intrinsic Amplitude 2.125 mV    Lead Channel Sensing Intrinsic Amplitude 2.125 mV   Lead Channel Pacing Threshold Amplitude 0.75 V   Lead Channel Pacing Threshold Pulse Width 0.4 ms   Lead Channel Impedance Value 418 ohm   Lead Channel Impedance Value 399 ohm   Lead Channel Sensing Intrinsic Amplitude 5.5 mV   Lead Channel Sensing Intrinsic Amplitude 5.5 mV   Lead Channel Pacing Threshold Amplitude 0.75 V   Lead Channel Pacing Threshold Pulse Width 0.4 ms   HighPow Impedance 72 ohm   Battery Status OK    Battery Remaining Longevity 73 mo  Battery Voltage 2.98 V   Brady Statistic RA Percent Paced 81.95 %   Brady Statistic RV Percent Paced 99.84 %   Brady Statistic AP VP Percent 82.06 %   Brady Statistic AS VP Percent 17.89 %   Brady Statistic AP VS Percent 0.02 %   Brady Statistic AS VS Percent 0.03 %      Assessment & Plan:   Problem List Items Addressed This Visit      Cardiovascular and Mediastinum   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  BP remains below goal for age.  Continue current medication regimen and collaboration with cardiology and nephrology. CMP & lipid  today. Return in 6 months.       Relevant Orders   Comprehensive metabolic panel   Chronic systolic heart failure (HCC)    Continue collaboration with cardiology and review recommendations.        Endocrine   IFG (impaired fasting glucose)    A1C remains 5.8%.  Continue diet focus at home.      Relevant Orders   Bayer DCA Hb A1c Waived     Genitourinary   CKD (chronic kidney disease), stage III (HCC)    Chronic, ongoing.  Continue collaboration with nephrology and review recommendations.  CMP today.        Other   Hyperlipidemia    Chronic, no statin use.  With advanced age will continue to monitor.      Relevant Orders   Comprehensive metabolic panel   Lipid Panel w/o Chol/HDL Ratio    Other Visit Diagnoses    Need for Td vaccine       Relevant  Orders   Td vaccine greater than or equal to 7yo preservative free IM       Follow up plan: Return in about 6 months (around 07/23/2019) for HTN/HLD & CKD.

## 2019-01-20 NOTE — Assessment & Plan Note (Signed)
A1C remains 5.8%.  Continue diet focus at home.

## 2019-01-20 NOTE — Assessment & Plan Note (Signed)
Chronic, ongoing.  Continue collaboration with nephrology and review recommendations.  CMP today. 

## 2019-01-20 NOTE — Assessment & Plan Note (Signed)
Chronic, ongoing.  BP remains below goal for age.  Continue current medication regimen and collaboration with cardiology and nephrology. CMP & lipid  today. Return in 6 months.

## 2019-01-20 NOTE — Assessment & Plan Note (Signed)
Chronic, no statin use.  With advanced age will continue to monitor.

## 2019-01-21 LAB — COMPREHENSIVE METABOLIC PANEL
ALT: 21 IU/L (ref 0–44)
AST: 18 IU/L (ref 0–40)
Albumin/Globulin Ratio: 1.9 (ref 1.2–2.2)
Albumin: 4.1 g/dL (ref 3.6–4.6)
Alkaline Phosphatase: 62 IU/L (ref 39–117)
BUN/Creatinine Ratio: 15 (ref 10–24)
BUN: 28 mg/dL — ABNORMAL HIGH (ref 8–27)
Bilirubin Total: 0.4 mg/dL (ref 0.0–1.2)
CO2: 23 mmol/L (ref 20–29)
Calcium: 8.8 mg/dL (ref 8.6–10.2)
Chloride: 100 mmol/L (ref 96–106)
Creatinine, Ser: 1.86 mg/dL — ABNORMAL HIGH (ref 0.76–1.27)
GFR calc Af Amer: 37 mL/min/{1.73_m2} — ABNORMAL LOW (ref >59)
GFR calc non Af Amer: 32 mL/min/{1.73_m2} — ABNORMAL LOW (ref >59)
Globulin, Total: 2.2 g/dL (ref 1.5–4.5)
Glucose: 128 mg/dL — ABNORMAL HIGH (ref 65–99)
Potassium: 4.6 mmol/L (ref 3.5–5.2)
Sodium: 138 mmol/L (ref 134–144)
Total Protein: 6.3 g/dL (ref 6.0–8.5)

## 2019-01-21 LAB — LIPID PANEL W/O CHOL/HDL RATIO
Cholesterol, Total: 187 mg/dL (ref 100–199)
HDL: 40 mg/dL (ref >39)
LDL Calculated: 112 mg/dL — ABNORMAL HIGH (ref 0–99)
Triglycerides: 176 mg/dL — ABNORMAL HIGH (ref 0–149)
VLDL Cholesterol Cal: 35 mg/dL (ref 5–40)

## 2019-02-11 ENCOUNTER — Other Ambulatory Visit: Payer: Self-pay | Admitting: Internal Medicine

## 2019-02-11 MED ORDER — AMLODIPINE BESYLATE 2.5 MG PO TABS: ORAL_TABLET | ORAL | 0 refills | Status: DC

## 2019-02-11 NOTE — Telephone Encounter (Signed)
°*  STAT* If patient is at the pharmacy, call can be transferred to refill team.   1. Which medications need to be refilled? (please list name of each medication and dose if known)   Amlodipine 2.5 mg po q d   2. Which pharmacy/location (including street and city if local pharmacy) is medication to be sent to?   [facility] [address]   3. Do they need a 30 day or 90 day supply? [place]

## 2019-03-15 ENCOUNTER — Other Ambulatory Visit: Payer: Self-pay

## 2019-03-15 MED ORDER — AMLODIPINE BESYLATE 2.5 MG PO TABS: ORAL_TABLET | ORAL | 0 refills | Status: DC

## 2019-03-17 ENCOUNTER — Ambulatory Visit (INDEPENDENT_AMBULATORY_CARE_PROVIDER_SITE_OTHER): Payer: Medicare Other | Admitting: Internal Medicine

## 2019-03-17 ENCOUNTER — Other Ambulatory Visit: Payer: Self-pay

## 2019-03-17 ENCOUNTER — Encounter: Payer: Self-pay | Admitting: Internal Medicine

## 2019-03-17 VITALS — BP 122/70 | HR 70 | Temp 97.9°F | Ht 71.0 in | Wt 189.0 lb

## 2019-03-17 DIAGNOSIS — Z8679 Personal history of other diseases of the circulatory system: Secondary | ICD-10-CM

## 2019-03-17 DIAGNOSIS — Z9889 Other specified postprocedural states: Secondary | ICD-10-CM

## 2019-03-17 DIAGNOSIS — I428 Other cardiomyopathies: Secondary | ICD-10-CM | POA: Diagnosis not present

## 2019-03-17 DIAGNOSIS — I10 Essential (primary) hypertension: Secondary | ICD-10-CM

## 2019-03-17 DIAGNOSIS — I340 Nonrheumatic mitral (valve) insufficiency: Secondary | ICD-10-CM | POA: Diagnosis not present

## 2019-03-17 DIAGNOSIS — I5022 Chronic systolic (congestive) heart failure: Secondary | ICD-10-CM

## 2019-03-17 NOTE — Progress Notes (Signed)
Follow-up Outpatient Visit Date: 03/17/2019  Primary Care Provider: Marjie Skiff, NP [address] [place] [address]  Chief Complaint: Follow-up cardiomyopathy and mitral regurgitation  HPI:  Mr. Leder is a 85 y.o. year-old male with history of severe mitral regurgitation status post mitral valve repair (03/2016),ventriculartachycardia status post ICD (03/2016), nonischemic cardiomyopathywith chronic systolic heart failure, nonobstructive CAD, hypertension, hyperlipidemia,andchronic kidney diseaseIII, who presents for follow-up of heart failure and valvular heart disease.  He was last seen in our office in 06/2018 by Eula Listen, PA.  He was doing well at that time.  During a virtual visit with Dr. Ladona Ridgel (EP) in April, Mr. eyal greenhaw like he was moving slower and having some trouble moving his legs.  No medication changes were made.  Today, Mr. Bruso reports that he has been feeling well.  At times, he feels like his legs move more slowly, especially when going down stairs.  He otherwise feels about the same as last year.  He denies chest pain, shortness of breath, palpitations, lightheadedness, edema, and orthopnea.  He is tolerating his current medications well, though he hopes that amiodarone can be decreased further when he follows up with Dr. Ladona Ridgel (next visit in January).  --------------------------------------------------------------------------------------------------  Cardiovascular History & Procedures: Cardiovascular Problems:  Severe mitral regurgitation status post repair (03/2016)  Chronic systolic heart failure secondary to nonischemic cardiomyopathy  Frequent ventricular ectopy with sustained ventricular tachycardia status post ICD  Nonobstructive coronary artery disease  Risk Factors:  Age, male gender, hypertension, and hyperlipidemia  Cath/PCI:  LHC/RHC (02/23/16): Mild, nonobstructive CAD (30% ostial LMCA, 30% ostial LAD, 30% ostial ramus,  and minimal luminal irregularities of dominant LCx. Normal right heart filling pressures. Decreased Fick cardiac output (CO 3.6 L/m, CI 1.8 L/m/m) in setting of frequent PVCs and severe MR.  CV Surgery:  Minimally invasive mitral valve repair (03/19/16, Dr. Cornelius Moras)  EP Procedures and Devices:  24-hour Holter monitor (02/27/16): Predominantly sinus rhythm with average heart rate is 63 bpm (range 49-91 bpm). Longest RR interval 1.6 seconds. Frequent PVCs noted (11% burden) including frequent couplets and bigeminal cycles. 90 runs of NSVT were noted lasting up to 9 beats the maximum rate of 134 bpm. Rare SVT and supraventricular ectopy noted.  Dual chamber ICD (03/27/16, Medtronic)  Non-Invasive Evaluation(s):  TTE (04/25/17): Mildly dilated LV with moderate LVH. LVEF 25-30% with diffuse hypokinesis. Aortic sclerosis. Moderately elevated transmitral gradient status post repair. Moderate mitral regurgitation. Mild to moderate left atrial enlargement. Mildly reduced RV contraction.  Limited TTE (02/11/17): LVEF 25-30% with probable akinesis of the inferolateral and inferior myocardium. Grade 2 diastolic dysfunction. Mild LA enlargement.  TTE (09/03/16): Moderately dilated left ventricle with LVEF less than 20% and global hypokinesis. Prior surgical repair of mitral valve is evident with moderate regurgitation. Left atrium is moderately dilated. RV size and function are normal. Moderate pulmonary hypertension noted with RVSP of 54 mmHg.  Limited TTE (03/22/16): Normal LV size with moderate LVH and LVEF of 40-45% with possible inferior hypokinesis and incoordinate septal motion. Mitral angioplasty ring in place with trivial MR. No significant mitral stenosis. Mild left atrial enlargement. Normal RV was moderately reduced systolic function. RVSP 41 mm or mercury. Elevated central venous pressure.  TTE (02/14/16): Normal LV size with moderate LVH. EF mildly reduced at 50% with possible inferolateral  hypokinesis. Moderate to severe MR noted with prolapse/flail of the posterior leaflet. Severe left atrial enlargement. Moderate TR.  TEE (02/23/16): Normal LV size and wall thickness with lower normal  LVEF. Trivial AI. Severe, eccentric MR with reversal of pulmonary vein flow and flail posterior leaflet. No left atrial thrombus. Lipomatous hypertrophy of the septum. Mild to moderate TR.  Limited TTE (03/22/16): Normal LV size with moderate LVH. LVEF 40-45% with possible inferior hypokinesis. Trivial MR with mild left atrial enlargement. Normal RV size with moderately reduced systolic function. Mild TR. No pericardial effusion.  Recent CV Pertinent Labs: Lab Results  Component Value Date   CHOL 187 01/20/2019   HDL 40 01/20/2019   LDLCALC 112 (H) 01/20/2019   TRIG 176 (H) 01/20/2019   CHOLHDL 4.1 04/26/2017   INR 1.11 04/25/2017   BNP 805.0 (H) 09/12/2016   K 4.6 01/20/2019   MG 2.2 04/24/2017   BUN 28 (H) 01/20/2019   CREATININE 1.86 (H) 01/20/2019    Past medical and surgical history were reviewed and updated in EPIC.  No outpatient medications have been marked as taking for the 03/17/19 encounter (Appointment) with Tequlia Gonsalves, Cristal Deer, MD.    Allergies: Amiodarone and Lyrica [pregabalin]  Social History   Tobacco Use  . Smoking status: Never Smoker  . Smokeless tobacco: Never Used  Substance Use Topics  . Alcohol use: No  . Drug use: No    Family History  Problem Relation Age of Onset  . Hypertension Mother   . Stroke Mother   . Heart disease Father   . Heart attack Father   . Dementia Sister   . Retinoblastoma Son   . COPD Neg Hx   . Cancer Neg Hx   . Diabetes Neg Hx     Review of Systems: A 12-system review of systems was performed and was negative except as noted in the HPI.  --------------------------------------------------------------------------------------------------  Physical Exam: There were no vitals taken for this visit.  General:  NAD HEENT: No  conjunctival pallor or scleral icterus. Facemask in place Neck: Supple without lymphadenopathy, thyromegaly, JVD, or HJR. Lungs: Normal work of breathing. Clear to auscultation bilaterally without wheezes or crackles. Heart: Regular rate and rhythm with 1/6 systolic murmur.  No rubs or gallops. Non-displaced PMI. Abd: Bowel sounds present. Soft, NT/ND without hepatosplenomegaly Ext: No lower extremity edema. Radial, PT, and DP pulses are 2+ bilaterally. Skin: Warm and dry without rash.  EKG:  AV-paced.  Lab Results  Component Value Date   WBC 6.9 07/22/2018   HGB 15.4 07/22/2018   HCT 46.7 07/22/2018   MCV 93 07/22/2018   PLT 195 07/22/2018    Lab Results  Component Value Date   NA 138 01/20/2019   K 4.6 01/20/2019   CL 100 01/20/2019   CO2 23 01/20/2019   BUN 28 (H) 01/20/2019   CREATININE 1.86 (H) 01/20/2019   GLUCOSE 128 (H) 01/20/2019   ALT 21 01/20/2019    Lab Results  Component Value Date   CHOL 187 01/20/2019   HDL 40 01/20/2019   LDLCALC 112 (H) 01/20/2019   TRIG 176 (H) 01/20/2019   CHOLHDL 4.1 04/26/2017    --------------------------------------------------------------------------------------------------  ASSESSMENT AND PLAN: Chronic systolic heart failure due to non-ischemic cardiomyopathy: Mr. Reeck appears euvolemic with NYHA class II symptoms.  Weight has been stable since 07/2018.  Unfortunately, he has been intolerant of all evidence-based heart failure therapy.  We will continue his current regimen of aspirin (s/p mitral valve repair) and amlodipine.  Mitral regurgitation s/p repair: Soft systolic murmur appreciated on exam.  Given lack of worsening HF symptoms, we will defer repeat echo at this time.  We will continue low dose aspirin.  He should continue with SBE prophylaxis for dental procedures.  Hypertension: BP normal.  Continue current dose of amlodipine.  History of ventricular tachycardia: No symptoms to suggest recurrence.  Patient  remains AV paced.  He has previously declined CRT in the setting of severely reduced LVEF.  We will continue his current dose of amiodarone pending follow-up with Dr. Lovena Le.  Continue routine device clinic follow-up.  Follow-up: Return to clinic in 6 months.  Nelva Bush, MD 03/17/2019 1:28 PM

## 2019-03-17 NOTE — Patient Instructions (Signed)
Medication Instructions:  Your physician recommends that you continue on your current medications as directed. Please refer to the Current Medication list given to you today.  If you need a refill on your cardiac medications before your next appointment, please call your pharmacy.   Lab work: NONE If you have labs (blood work) drawn today and your tests are completely normal, you will receive your results only by: Marland Kitchen MyChart Message (if you have MyChart) OR . A paper copy in the mail If you have any lab test that is abnormal or we need to change your treatment, we will call you to review the results.  Testing/Procedures: NONE  Follow-Up: At [facility], you and your health needs are our priority.  As part of our continuing mission to provide you with exceptional heart care, we have created designated Provider Care Teams.  These Care Teams include your primary Cardiologist (physician) and Advanced Practice Providers (APPs -  Physician Assistants and Nurse Practitioners) who all work together to provide you with the care you need, when you need it. You will need a follow up appointment in 6 months.  You may see DR Harrell Gave END or one of the following Advanced Practice Providers on your designated Care Team:   Murray Hodgkins, NP Christell Faith, PA-C . Marrianne Mood, PA-C

## 2019-03-18 ENCOUNTER — Encounter: Payer: Self-pay | Admitting: Internal Medicine

## 2019-03-18 DIAGNOSIS — Z8679 Personal history of other diseases of the circulatory system: Secondary | ICD-10-CM | POA: Insufficient documentation

## 2019-03-18 DIAGNOSIS — I10 Essential (primary) hypertension: Secondary | ICD-10-CM | POA: Insufficient documentation

## 2019-03-19 ENCOUNTER — Ambulatory Visit (INDEPENDENT_AMBULATORY_CARE_PROVIDER_SITE_OTHER): Payer: Medicare Other

## 2019-03-19 ENCOUNTER — Other Ambulatory Visit: Payer: Self-pay

## 2019-03-19 DIAGNOSIS — Z23 Encounter for immunization: Secondary | ICD-10-CM | POA: Diagnosis not present

## 2019-03-22 ENCOUNTER — Ambulatory Visit (INDEPENDENT_AMBULATORY_CARE_PROVIDER_SITE_OTHER): Payer: Medicare Other | Admitting: *Deleted

## 2019-03-22 DIAGNOSIS — I5022 Chronic systolic (congestive) heart failure: Secondary | ICD-10-CM

## 2019-03-22 DIAGNOSIS — I428 Other cardiomyopathies: Secondary | ICD-10-CM

## 2019-03-23 LAB — CUP PACEART REMOTE DEVICE CHECK
Battery Remaining Longevity: 69 mo
Battery Voltage: 2.98 V
Brady Statistic AP VP Percent: 85.32 %
Brady Statistic AP VS Percent: 0.05 %
Brady Statistic AS VP Percent: 14.55 %
Brady Statistic AS VS Percent: 0.08 %
Brady Statistic RA Percent Paced: 84.6 %
Brady Statistic RV Percent Paced: 99.27 %
Date Time Interrogation Session: 20201020152603
HighPow Impedance: 75 Ohm
Implantable Lead Implant Date: 20171025
Implantable Lead Implant Date: 20171025
Implantable Lead Location: 753859
Implantable Lead Location: 753860
Implantable Lead Model: 5076
Implantable Pulse Generator Implant Date: 20171025
Lead Channel Impedance Value: 399 Ohm
Lead Channel Impedance Value: 418 Ohm
Lead Channel Impedance Value: 456 Ohm
Lead Channel Pacing Threshold Amplitude: 0.625 V
Lead Channel Pacing Threshold Amplitude: 0.75 V
Lead Channel Pacing Threshold Pulse Width: 0.4 ms
Lead Channel Pacing Threshold Pulse Width: 0.4 ms
Lead Channel Sensing Intrinsic Amplitude: 2.625 mV
Lead Channel Sensing Intrinsic Amplitude: 2.625 mV
Lead Channel Sensing Intrinsic Amplitude: 5.5 mV
Lead Channel Sensing Intrinsic Amplitude: 5.5 mV
Lead Channel Setting Pacing Amplitude: 2 V
Lead Channel Setting Pacing Amplitude: 2.5 V
Lead Channel Setting Pacing Pulse Width: 0.4 ms
Lead Channel Setting Sensing Sensitivity: 0.3 mV

## 2019-04-12 ENCOUNTER — Other Ambulatory Visit: Payer: Self-pay | Admitting: *Deleted

## 2019-04-12 MED ORDER — AMLODIPINE BESYLATE 2.5 MG PO TABS: ORAL_TABLET | ORAL | 0 refills | Status: DC

## 2019-04-12 NOTE — Progress Notes (Signed)
Remote ICD transmission.   

## 2019-05-13 ENCOUNTER — Other Ambulatory Visit: Payer: Self-pay | Admitting: Internal Medicine

## 2019-05-13 MED ORDER — AMLODIPINE BESYLATE 2.5 MG PO TABS: ORAL_TABLET | ORAL | 0 refills | Status: DC

## 2019-05-13 NOTE — Telephone Encounter (Signed)
Requested Prescriptions   Signed Prescriptions Disp Refills   amLODipine (NORVASC) 2.5 MG tablet 90 tablet 0    Sig: TAKE 1 TABLET(2.5 MG) BY MOUTH DAILY    Authorizing Provider: END, CHRISTOPHER    Ordering User: NEWCOMER MCCLAIN, Kaydon Creedon L

## 2019-05-13 NOTE — Telephone Encounter (Signed)
°*  STAT* If patient is at the pharmacy, call can be transferred to refill team.   1. Which medications need to be refilled? (please list name of each medication and dose if known)  Amlodipine 2.5 mg po q d   2. Which pharmacy/location (including street and city if local pharmacy) is medication to be sent to?  [facility]   3. Do they need a 30 day or 90 day supply? [place]

## 2019-05-14 ENCOUNTER — Other Ambulatory Visit: Payer: Self-pay

## 2019-05-14 DIAGNOSIS — Z20822 Contact with and (suspected) exposure to covid-19: Secondary | ICD-10-CM

## 2019-05-15 LAB — NOVEL CORONAVIRUS, NAA: SARS-CoV-2, NAA: NOT DETECTED

## 2019-06-15 ENCOUNTER — Telehealth: Payer: Self-pay | Admitting: Internal Medicine

## 2019-06-15 NOTE — Telephone Encounter (Signed)
New Message   Patient is calling because he would like for his son to be with him at his appt. Please advise.

## 2019-06-15 NOTE — Telephone Encounter (Signed)
Returned call to Pt.  Ok for son to accompany to appt.

## 2019-06-17 ENCOUNTER — Other Ambulatory Visit: Payer: Self-pay

## 2019-06-17 ENCOUNTER — Ambulatory Visit (INDEPENDENT_AMBULATORY_CARE_PROVIDER_SITE_OTHER): Payer: Medicare Other | Admitting: Internal Medicine

## 2019-06-17 ENCOUNTER — Encounter: Payer: Self-pay | Admitting: Internal Medicine

## 2019-06-17 VITALS — BP 110/70 | HR 81 | Ht 71.0 in | Wt 189.6 lb

## 2019-06-17 DIAGNOSIS — I472 Ventricular tachycardia, unspecified: Secondary | ICD-10-CM

## 2019-06-17 DIAGNOSIS — I428 Other cardiomyopathies: Secondary | ICD-10-CM | POA: Diagnosis not present

## 2019-06-17 DIAGNOSIS — Z95810 Presence of automatic (implantable) cardiac defibrillator: Secondary | ICD-10-CM | POA: Diagnosis not present

## 2019-06-17 NOTE — Patient Instructions (Signed)

## 2019-06-17 NOTE — Progress Notes (Signed)
HPI Roy Palmer returns today for followup of his VT, MR,s /p MV repair, chronic systolic heart failure and HTN. In the interim he has done well. He denies any intercurrent ICD therapies. 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 He has been on amiodarone and has had no recurrent VT.  Allergies  Allergen Reactions  . Amiodarone Other (See Comments)    Terrible dreams/nightmares in high dosing  . Lyrica [Pregabalin] Nausea And Vomiting     Current Outpatient Medications  Medication Sig Dispense Refill  . amiodarone (PACERONE) 200 MG tablet TAKE 1 TABLET BY MOUTH EVERY DAY MONDAY THROUGH SATURDAY. DO NOT TAKE ON SUNDAY 90 tablet 3  . amLODipine (NORVASC) 2.5 MG tablet TAKE 1 TABLET(2.5 MG) BY MOUTH DAILY 90 tablet 0  . aspirin EC 81 MG tablet Take 1 tablet (81 mg total) by mouth daily.     No current facility-administered medications for this visit.     Past Medical History:  Diagnosis Date  . AAA (abdominal aortic aneurysm) without rupture ([place]) 03/12/2016   Small - measured 3.3 x 3.5 cm by CTA  . CKD (chronic kidney disease) stage 3, GFR 30-59 ml/min   . Essential hypertension   . Hyperlipidemia   . Hypertensive kidney disease   . Incidental pulmonary nodule 03/12/2016   Vague opacity right lung base requires radiographic follow up - index of suspicion is LOW  . Near syncope    Cardiogenic, related to an SVT and severe MR  . Non-sustained ventricular tachycardia ([place])   . S/P minimally invasive mitral valve repair 03/19/2016   Complex valvuloplasty including triangular resection of flail segment of posterior leaflet, chordal transposition x1, artificial Gore-tex neochord placement x4 and 34 mm Sorin Memo 3D ring annuloplasty via right mini thoracotomy approach with clipping of LA appendage  . Severe mitral regurgitation by prior echocardiogram 02/23/2016   Confirmed by TEE  . Syncope 03/15/2016  . Thoracic aortic aneurysm ([place]) 03/12/2016   Very very mild  fusiform enlargement of distal transverse and proximal descending thoracic aorta noted on CTA  . Trigeminal neuralgia     ROS:   All systems reviewed and negative except as noted in the HPI.   Past Surgical History:  Procedure Laterality Date  . CARDIAC CATHETERIZATION N/A 02/23/2016   Procedure: Right/Left Heart Cath and Coronary Angiography;  Surgeon: Nelva Bush, MD;  Location: [facility];  Service: Cardiovascular: Angiographic minimal coronary disease. Normal left and right heart Pressures. Decreased CO/CI in settng of frequent PVCs & Severe MR.  Marland Kitchen CATARACT EXTRACTION, BILATERAL    . COLONOSCOPY  08/2005  . CYST EXCISION  10/2013   from neck  . EP IMPLANTABLE DEVICE N/A 03/27/2016   Procedure: ICD Implant Dual Chamber;  Surgeon: Evans Lance, MD;  Location: [facility];  Service: Cardiovascular;  Laterality: N/A;  . ESOPHAGOGASTRODUODENOSCOPY  02/2010  . MITRAL VALVE REPAIR Right 03/19/2016   Procedure: MINIMALLY INVASIVE MITRAL VALVE REPAIR (MVR);  Surgeon: Rexene Alberts, MD;  Location: [facility];  Service: Open Heart Surgery;  Laterality: Right;  . TEE WITHOUT CARDIOVERSION N/A 02/23/2016   Procedure: TRANSESOPHAGEAL ECHOCARDIOGRAM (TEE);  Surgeon: Sueanne Margarita, MD;  Location: [facility] ENDOSCOPY;  Service: Cardiovascular: Normal LV size and function.  Degenerative mitral valve disease with failed posterior leaflet (P2 segment), Severe MR with pulmonary vein systolic flow reversal. Moderate-TR.    . TEE WITHOUT CARDIOVERSION N/A 03/19/2016   Procedure: TRANSESOPHAGEAL ECHOCARDIOGRAM (TEE);  Surgeon: [name], MD;  Location: [facility] OR;  Service: Open Heart Surgery;  Laterality: N/A;  . TRANSTHORACIC ECHOCARDIOGRAM  02/14/2016   EF 50-55% with moderate LVH. Possible inferolateral hypokinesis. Moderate-severe MR with posterior leaflet prolapse, cannot rule out flail. Severe LA dilation. Moderate TR.     Family History  Problem Relation Age of Onset  . Hypertension  Mother   . Stroke Mother   . Heart disease Father   . Heart attack Father   . Dementia Sister   . Retinoblastoma Son   . COPD Neg Hx   . Cancer Neg Hx   . Diabetes Neg Hx      Social History   Socioeconomic History  . Marital status: Married    Spouse name: Not on file  . Number of children: Not on file  . Years of education: Not on file  . Highest education level: Associate degree: academic program  Occupational History  . Occupation: retired  Tobacco Use  . Smoking status: Never Smoker  . Smokeless tobacco: Never Used  Substance and Sexual Activity  . Alcohol use: No  . Drug use: No  . Sexual activity: Never  Other Topics Concern  . Not on file  Social History Narrative  . Not on file   Social Determinants of Health   Financial Resource Strain:   . Difficulty of Paying Living Expenses: Not on file  Food Insecurity:   . Worried About Programme researcher, broadcasting/film/video in the Last Year: Not on file  . Ran Out of Food in the Last Year: Not on file  Transportation Needs:   . Lack of Transportation (Medical): Not on file  . Lack of Transportation (Non-Medical): Not on file  Physical Activity:   . Days of Exercise per Week: Not on file  . Minutes of Exercise per Session: Not on file  Stress:   . Feeling of Stress : Not on file  Social Connections:   . Frequency of Communication with Friends and Family: Not on file  . Frequency of Social Gatherings with Friends and Family: Not on file  . Attends Religious Services: Not on file  . Active Member of Clubs or Organizations: Not on file  . Attends Banker Meetings: Not on file  . Marital Status: Not on file  Intimate Partner Violence:   . Fear of Current or Ex-Partner: Not on file  . Emotionally Abused: Not on file  . Physically Abused: Not on file  . Sexually Abused: Not on file     BP 110/70   Pulse 81   Ht 5\' 11"  (1.803 m)   Wt 189 lb 9.6 oz (86 kg)   SpO2 97%   BMI 26.44 kg/m   Physical Exam:  Well  appearing NAD HEENT: Unremarkable Neck:  No JVD, no thyromegally Lymphatics:  No adenopathy Back:  No CVA tenderness Lungs:  Clear with no wheezes HEART:  Regular rate rhythm, no murmurs, no rubs, no clicks Abd:  soft, positive bowel sounds, no organomegally, no rebound, no guarding Ext:  2 plus pulses, no edema, no cyanosis, no clubbing Skin:  No rashes no nodules Neuro:  CN II through XII intact, motor grossly intact  EKG - nsr with ventricular pacing  DEVICE  Normal device function.  See PaceArt for details.   Assess/Plan: 1. VT - he has had no VT since his last visit on amiodarone. He will continue 200 mg daily, none on Sunday. 2. Chronic systolic heart failure - his  symptoms are class 2. He has refused upgrade to a Biv which is reasonable while his CHF is controlled. He is encouraged to maintain a low sodium diet. 3. ICD - his Medtronic DDD Biv ICD is working normally. We will recheck in several months.   Leonia Reeves.D.

## 2019-06-21 ENCOUNTER — Ambulatory Visit (INDEPENDENT_AMBULATORY_CARE_PROVIDER_SITE_OTHER): Payer: Medicare Other | Admitting: *Deleted

## 2019-06-21 DIAGNOSIS — I472 Ventricular tachycardia, unspecified: Secondary | ICD-10-CM

## 2019-06-21 LAB — CUP PACEART REMOTE DEVICE CHECK
Battery Remaining Longevity: 63 mo
Battery Voltage: 2.97 V
Brady Statistic AP VP Percent: 76.9 %
Brady Statistic AP VS Percent: 0.04 %
Brady Statistic AS VP Percent: 23.01 %
Brady Statistic AS VS Percent: 0.05 %
Brady Statistic RA Percent Paced: 76.89 %
Brady Statistic RV Percent Paced: 99.87 %
Date Time Interrogation Session: 20210118150932
HighPow Impedance: 76 Ohm
Implantable Lead Implant Date: 20171025
Implantable Lead Implant Date: 20171025
Implantable Lead Location: 753859
Implantable Lead Location: 753860
Implantable Lead Model: 5076
Implantable Pulse Generator Implant Date: 20171025
Lead Channel Impedance Value: 361 Ohm
Lead Channel Impedance Value: 418 Ohm
Lead Channel Impedance Value: 513 Ohm
Lead Channel Pacing Threshold Amplitude: 0.5 V
Lead Channel Pacing Threshold Amplitude: 0.625 V
Lead Channel Pacing Threshold Pulse Width: 0.4 ms
Lead Channel Pacing Threshold Pulse Width: 0.4 ms
Lead Channel Sensing Intrinsic Amplitude: 2 mV
Lead Channel Sensing Intrinsic Amplitude: 2 mV
Lead Channel Sensing Intrinsic Amplitude: 5.5 mV
Lead Channel Sensing Intrinsic Amplitude: 5.5 mV
Lead Channel Setting Pacing Amplitude: 2 V
Lead Channel Setting Pacing Amplitude: 2.5 V
Lead Channel Setting Pacing Pulse Width: 0.4 ms
Lead Channel Setting Sensing Sensitivity: 0.3 mV

## 2019-06-22 NOTE — Progress Notes (Signed)
ICD remote 

## 2019-07-06 ENCOUNTER — Other Ambulatory Visit: Payer: Self-pay

## 2019-07-06 ENCOUNTER — Ambulatory Visit
Admission: RE | Admit: 2019-07-06 | Discharge: 2019-07-06 | Disposition: A | Discharge status: Home / Self Care | Payer: Medicare Other | Source: Ambulatory Visit | Attending: Physician Assistant | Admitting: Physician Assistant

## 2019-07-06 ENCOUNTER — Other Ambulatory Visit: Payer: Self-pay | Admitting: Physician Assistant

## 2019-07-06 ENCOUNTER — Other Ambulatory Visit (HOSPITAL_COMMUNITY): Payer: Self-pay | Admitting: Physician Assistant

## 2019-07-06 DIAGNOSIS — L03114 Cellulitis of left upper limb: Secondary | ICD-10-CM

## 2019-07-14 ENCOUNTER — Ambulatory Visit (INDEPENDENT_AMBULATORY_CARE_PROVIDER_SITE_OTHER): Payer: Medicare Other

## 2019-07-14 DIAGNOSIS — Z0000 Encounter for general adult medical examination without abnormal findings: Secondary | ICD-10-CM | POA: Diagnosis not present

## 2019-07-14 NOTE — Progress Notes (Signed)
Subjective:   Roy Palmer is a 86 y.o. male who presents for Medicare Annual/Subsequent preventive examination.  This visit is being conducted via phone call  - after an attmept to do on video chat - due to the COVID-19 pandemic. This patient has given me verbal consent via phone to conduct this visit, patient states they are participating from their home address. Some vital signs may be absent or patient reported.   Patient identification: identified by name, DOB, and current address.    Review of Systems:   Cardiac Risk Factors include: advanced age (>4men, >87 women);male gender;dyslipidemia;hypertension     Objective:    Vitals: There were no vitals taken for this visit.  There is no height or weight on file to calculate BMI.  Advanced Directives 07/14/2019 07/08/2018 06/11/2017 04/25/2017 04/24/2017 06/24/2016 03/14/2016  Does Patient Have a Medical Advance Directive? Yes Yes Yes Yes Yes Yes No  Type of Advance Directive Living will;Healthcare Power of Attorney Living will Living will;Healthcare Power of Attorney Living will Living will - -  Does patient want to make changes to medical advance directive? - - - No - Patient declined - - -  Copy of [place] in Chart? No - copy requested - No - copy requested - - - -  Would patient like information on creating a medical advance directive? - - - - - - No - patient declined information    Tobacco Social History   Tobacco Use  Smoking Status Never Smoker  Smokeless Tobacco Never Used     Counseling given: Not Answered   Clinical Intake:  Pre-visit preparation completed: Yes  Pain : No/denies pain     Nutritional Risks: None Diabetes: No  How often do you need to have someone help you when you read instructions, pamphlets, or other written materials from your doctor or pharmacy?: 1 - Never  Interpreter Needed?: No  Information entered by :: Anastasia Tompson,LPN  Past Medical History:  Diagnosis  Date  . AAA (abdominal aortic aneurysm) without rupture ([facility]) 03/12/2016   Small - measured 3.3 x 3.5 cm by CTA  . CKD (chronic kidney disease) stage 3, GFR 30-59 ml/min   . Essential hypertension   . Hyperlipidemia   . Hypertensive kidney disease   . Incidental pulmonary nodule 03/12/2016   Vague opacity right lung base requires radiographic follow up - index of suspicion is LOW  . Near syncope    Cardiogenic, related to an SVT and severe MR  . Non-sustained ventricular tachycardia ([place])   . S/P minimally invasive mitral valve repair 03/19/2016   Complex valvuloplasty including triangular resection of flail segment of posterior leaflet, chordal transposition x1, artificial Gore-tex neochord placement x4 and 34 mm Sorin Memo 3D ring annuloplasty via right mini thoracotomy approach with clipping of LA appendage  . Severe mitral regurgitation by prior echocardiogram 02/23/2016   Confirmed by TEE  . Syncope 03/15/2016  . Tennis elbow left  . Thoracic aortic aneurysm ([place]) 03/12/2016   Very very mild fusiform enlargement of distal transverse and proximal descending thoracic aorta noted on CTA  . Trigeminal neuralgia    Past Surgical History:  Procedure Laterality Date  . CARDIAC CATHETERIZATION N/A 02/23/2016   Procedure: Right/Left Heart Cath and Coronary Angiography;  Surgeon: Nelva Bush, MD;  Location: [facility];  Service: Cardiovascular: Angiographic minimal coronary disease. Normal left and right heart Pressures. Decreased CO/CI in settng of frequent PVCs & Severe MR.  Marland Kitchen CATARACT EXTRACTION,  BILATERAL    . COLONOSCOPY  08/2005  . CYST EXCISION  10/2013   from neck  . EP IMPLANTABLE DEVICE N/A 03/27/2016   Procedure: ICD Implant Dual Chamber;  Surgeon: Marinus Maw, MD;  [facility]: [facility] INVASIVE CV LAB;  Service: Cardiovascular;  Laterality: N/A;  . ESOPHAGOGASTRODUODENOSCOPY  02/2010  . MITRAL VALVE REPAIR Right 03/19/2016   Procedure: MINIMALLY INVASIVE MITRAL VALVE  REPAIR (MVR);  Surgeon: [name], MD;  Location: [facility] OR;  Service: Open Heart Surgery;  Laterality: Right;  . TEE WITHOUT CARDIOVERSION N/A 02/23/2016   Procedure: TRANSESOPHAGEAL ECHOCARDIOGRAM (TEE);  Surgeon: Quintella Reichert, MD;  Location: [facility] ENDOSCOPY;  Service: Cardiovascular: Normal LV size and function.  Degenerative mitral valve disease with failed posterior leaflet (P2 segment), Severe MR with pulmonary vein systolic flow reversal. Moderate-TR.    . TEE WITHOUT CARDIOVERSION N/A 03/19/2016   Procedure: TRANSESOPHAGEAL ECHOCARDIOGRAM (TEE);  Surgeon: [name], MD;  Location[facility];  Service: Open Heart Surgery;  Laterality: N/A;  . TRANSTHORACIC ECHOCARDIOGRAM  02/14/2016   EF 50-55% with moderate LVH. Possible inferolateral hypokinesis. Moderate-severe MR with posterior leaflet prolapse, cannot rule out flail. Severe LA dilation. Moderate TR.   Family History  Problem Relation Age of Onset  . Hypertension Mother   . Stroke Mother   . Heart disease Father   . Heart attack Father   . Dementia Sister   . Retinoblastoma Son   . COPD Neg Hx   . Cancer Neg Hx   . Diabetes Neg Hx    Social History   Socioeconomic History  . Marital status: Married    Spouse name: Not on file  . Number of children: Not on file  . Years of education: Not on file  . Highest education level: Associate degree: academic program  Occupational History  . Occupation: retired  Tobacco Use  . Smoking status: Never Smoker  . Smokeless tobacco: Never Used  Substance and Sexual Activity  . Alcohol use: No  . Drug use: No  . Sexual activity: Never  Other Topics Concern  . Not on file  Social History Narrative  . Not on file   Social Determinants of Health   Financial Resource Strain:   . Difficulty of Paying Living Expenses: Not on file  Food Insecurity:   . Worried About Programme researcher, broadcasting/film/video in the Last Year: Not on file  . Ran Out of Food in the Last Year: Not on file  Transportation  Needs:   . Lack of Transportation (Medical): Not on file  . Lack of Transportation (Non-Medical): Not on file  Physical Activity:   . Days of Exercise per Week: Not on file  . Minutes of Exercise per Session: Not on file  Stress:   . Feeling of Stress : Not on file  Social Connections:   . Frequency of Communication with Friends and Family: Not on file  . Frequency of Social Gatherings with Friends and Family: Not on file  . Attends Religious Services: Not on file  . Active Member of Clubs or Organizations: Not on file  . Attends Banker Meetings: Not on file  . Marital Status: Not on file    Outpatient Encounter Medications as of 07/14/2019  Medication Sig  . amiodarone (PACERONE) 200 MG tablet TAKE 1 TABLET BY MOUTH EVERY DAY MONDAY THROUGH SATURDAY. DO NOT TAKE ON SUNDAY  . amLODipine (NORVASC) 2.5 MG tablet TAKE 1 TABLET(2.5 MG) BY MOUTH DAILY  . aspirin  EC 81 MG tablet Take 1 tablet (81 mg total) by mouth daily.   No facility-administered encounter medications on file as of 07/14/2019.    Activities of Daily Living In your present state of health, do you have any difficulty performing the following activities: 07/14/2019 07/22/2018  Hearing? N N  Comment no hearing aids -  Vision? N N  Comment reading eyeglasses,[facility] -  Difficulty concentrating or making decisions? N N  Walking or climbing stairs? N N  Dressing or bathing? N N  Doing errands, shopping? N N  Preparing Food and eating ? N -  Using the Toilet? N -  In the past six months, have you accidently leaked urine? N -  Do you have problems with loss of bowel control? N -  Managing your Medications? N -  Managing your Finances? N -  Housekeeping or managing your Housekeeping? N -  Some recent data might be hidden    Patient Care Team: Marjie Skiff, NP as PCP - General (Nurse Practitioner) End, Cristal Deer, MD as Consulting Physician (Cardiology) Marinus Maw, MD as Consulting  Physician (Cardiology)   Assessment:   This is a routine wellness examination for Ryleigh.  Exercise Activities and Dietary recommendations Current Exercise Habits: Home exercise routine, Time (Minutes): 15, Frequency (Times/Week): 7, Weekly Exercise (Minutes/Week): 105, Intensity: Mild, Exercise limited by: None identified  Goals    . DIET - INCREASE WATER INTAKE     Recommend drinking at least 6-8 glasses of water a day        Fall Risk: Fall Risk  07/14/2019 07/22/2018 07/08/2018 06/11/2017 06/24/2016  Falls in the past year? 0 0 0 No No  Number falls in past yr: 0 0 0 - -  Injury with Fall? 0 0 - - -    FALL RISK PREVENTION PERTAINING TO THE HOME:  Any stairs in or around the home? Yes  If so, are there any without handrails? No   Home free of loose throw rugs in walkways, pet beds, electrical cords, etc? No  Adequate lighting in your home to reduce risk of falls? No   ASSISTIVE DEVICES UTILIZED TO PREVENT FALLS:  Life alert? No  Use of a cane, walker or w/c? No  Grab bars in the bathroom? No  Shower chair or bench in shower? No  Elevated toilet seat or a handicapped toilet? No   TIMED UP AND GO:  Unable to perform   Depression Screen PHQ 2/9 Scores 07/14/2019 07/22/2018 07/08/2018 06/11/2017  PHQ - 2 Score 0 0 0 0  PHQ- 9 Score - 0 - -    Cognitive Function     6CIT Screen 07/08/2018 06/11/2017  What Year? 0 points 0 points  What month? 0 points 0 points  What time? 0 points 0 points  Count back from 20 0 points 0 points  Months in reverse 0 points 0 points  Repeat phrase 0 points 2 points  Total Score 0 2    Immunization History  Administered Date(s) Administered  . Fluad Quad(high Dose 65+) 03/19/2019  . Influenza,inj,Quad PF,6+ Mos 05/07/2016, 02/19/2017, 04/17/2018  . Influenza-Unspecified 04/19/2015  . Pneumococcal Conjugate-13 10/29/2013  . Pneumococcal Polysaccharide-23 07/14/2008  . Td 07/14/2008, 01/20/2019  . Zoster 06/03/2010    Qualifies for  Shingles Vaccine? Yes shingrix completed   Tdap: up to date.  Flu Vaccine: up to date   Pneumococcal Vaccine: up to date .   Covid-19: will get at pharmacy   Screening Tests  Health Maintenance  Topic Date Due  . TETANUS/TDAP  01/19/2029  . INFLUENZA VACCINE  Completed  . PNA vac Low Risk Adult  Completed   Cancer Screenings:  Colorectal Screening: no longer required   Lung Cancer Screening: (Low Dose CT Chest recommended if Age 58-80 years, 30 pack-year currently smoking OR have quit w/in 15years.) does not qualify.    Additional Screening:  Hepatitis C Screening: does not qualify  Vision Screening: Recommended annual ophthalmology exams for early detection of glaucoma and other disorders of the eye. Is the patient up to date with their annual eye exam?  Yes  Who is the provider or what is the name of the office in which the pt attends annual eye exams? [facility]    Dental Screening: Recommended annual dental exams for proper oral hygiene  Community Resource Referral:  CRR required this visit?  No        Plan:  I have personally reviewed and addressed the Medicare Annual Wellness questionnaire and have noted the following in the patient's chart:  A. Medical and social history B. Use of alcohol, tobacco or illicit drugs  C. Current medications and supplements D. Functional ability and status E.  Nutritional status F.  Physical activity G. Advance directives H. List of other physicians I.  Hospitalizations, surgeries, and ER visits in previous 12 months J.  Vitals K. Screenings such as hearing and vision if needed, cognitive and depression L. Referrals and appointments   In addition, I have reviewed and discussed with patient certain preventive protocols, quality metrics, and best practice recommendations. A written personalized care plan for preventive services as well as general preventive health recommendations were provided to patient.   Signed,     Collene Schlichter, LPN  3/76/2831 Nurse Health Advisor   Nurse Notes: none

## 2019-07-14 NOTE — Patient Instructions (Signed)
[name] , Thank you for taking time to come for your Medicare Wellness Visit. I appreciate your ongoing commitment to your health goals. Please review the following plan we discussed and let me know if I can assist you in the future.   Screening recommendations/referrals: Colonoscopy: no longer required  Recommended yearly ophthalmology/optometry visit for glaucoma screening and checkup Recommended yearly dental visit for hygiene and checkup  Vaccinations: Influenza vaccine: up to date  Pneumococcal vaccine: up to date  Tdap vaccine: up to date  Shingles vaccine: up to date     Advanced directives: Please bring a copy of your health care power of attorney and living will to the office at your convenience.  Conditions/risks identified: none   Next appointment: Follow up in one year for your annual wellness visit   Preventive Care 65 Years and Older, Male Preventive care refers to lifestyle choices and visits with your health care provider that can promote health and wellness. What does preventive care include?  A yearly physical exam. This is also called an annual well check.  Dental exams once or twice a year.  Routine eye exams. Ask your health care provider how often you should have your eyes checked.  Personal lifestyle choices, including:  Daily care of your teeth and gums.  Regular physical activity.  Eating a healthy diet.  Avoiding tobacco and drug use.  Limiting alcohol use.  Practicing safe sex.  Taking low doses of aspirin every day.  Taking vitamin and mineral supplements as recommended by your health care provider. What happens during an annual well check? The services and screenings done by your health care provider during your annual well check will depend on your age, overall health, lifestyle risk factors, and family history of disease. Counseling  Your health care provider may ask you questions about your:  Alcohol use.  Tobacco use.  Drug  use.  Emotional well-being.  Home and relationship well-being.  Sexual activity.  Eating habits.  History of falls.  Memory and ability to understand (cognition).  Work and work Astronomer. Screening  You may have the following tests or measurements:  Height, weight, and BMI.  Blood pressure.  Lipid and cholesterol levels. These may be checked every 5 years, or more frequently if you are over 67 years old.  Skin check.  Lung cancer screening. You may have this screening every year starting at age 4 if you have a 30-pack-year history of smoking and currently smoke or have quit within the past 15 years.  Fecal occult blood test (FOBT) of the stool. You may have this test every year starting at age 91.  Flexible sigmoidoscopy or colonoscopy. You may have a sigmoidoscopy every 5 years or a colonoscopy every 10 years starting at age 39.  Prostate cancer screening. Recommendations will vary depending on your family history and other risks.  Hepatitis C blood test.  Hepatitis B blood test.  Sexually transmitted disease (STD) testing.  Diabetes screening. This is done by checking your blood sugar (glucose) after you have not eaten for a while (fasting). You may have this done every 1-3 years.  Abdominal aortic aneurysm (AAA) screening. You may need this if you are a current or former smoker.  Osteoporosis. You may be screened starting at age 35 if you are at high risk. Talk with your health care provider about your test results, treatment options, and if necessary, the need for more tests. Vaccines  Your health care provider may recommend certain vaccines, such  as:  Influenza vaccine. This is recommended every year.  Tetanus, diphtheria, and acellular pertussis (Tdap, Td) vaccine. You may need a Td booster every 10 years.  Zoster vaccine. You may need this after age 40.  Pneumococcal 13-valent conjugate (PCV13) vaccine. One dose is recommended after age  66.  Pneumococcal polysaccharide (PPSV23) vaccine. One dose is recommended after age 4. Talk to your health care provider about which screenings and vaccines you need and how often you need them. This information is not intended to replace advice given to you by your health care provider. Make sure you discuss any questions you have with your health care provider. Document Released: 06/16/2015 Document Revised: 02/07/2016 Document Reviewed: 03/21/2015 Elsevier Interactive Patient Education  2017 [place] Prevention in the Home Falls can cause injuries. They can happen to people of all ages. There are many things you can do to make your home safe and to help prevent falls. What can I do on the outside of my home?  Regularly fix the edges of walkways and driveways and fix any cracks.  Remove anything that might make you trip as you walk through a door, such as a raised step or threshold.  Trim any bushes or trees on the path to your home.  Use bright outdoor lighting.  Clear any walking paths of anything that might make someone trip, such as rocks or tools.  Regularly check to see if handrails are loose or broken. Make sure that both sides of any steps have handrails.  Any raised decks and porches should have guardrails on the edges.  Have any leaves, snow, or ice cleared regularly.  Use sand or salt on walking paths during winter.  Clean up any spills in your garage right away. This includes oil or grease spills. What can I do in the bathroom?  Use night lights.  Install grab bars by the toilet and in the tub and shower. Do not use towel bars as grab bars.  Use non-skid mats or decals in the tub or shower.  If you need to sit down in the shower, use a plastic, non-slip stool.  Keep the floor dry. Clean up any water that spills on the floor as soon as it happens.  Remove soap buildup in the tub or shower regularly.  Attach bath mats securely with double-sided  non-slip rug tape.  Do not have throw rugs and other things on the floor that can make you trip. What can I do in the bedroom?  Use night lights.  Make sure that you have a light by your bed that is easy to reach.  Do not use any sheets or blankets that are too big for your bed. They should not hang down onto the floor.  Have a firm chair that has side arms. You can use this for support while you get dressed.  Do not have throw rugs and other things on the floor that can make you trip. What can I do in the kitchen?  Clean up any spills right away.  Avoid walking on wet floors.  Keep items that you use a lot in easy-to-reach places.  If you need to reach something above you, use a strong step stool that has a grab bar.  Keep electrical cords out of the way.  Do not use floor polish or wax that makes floors slippery. If you must use wax, use non-skid floor wax.  Do not have throw rugs and other things on the  floor that can make you trip. What can I do with my stairs?  Do not leave any items on the stairs.  Make sure that there are handrails on both sides of the stairs and use them. Fix handrails that are broken or loose. Make sure that handrails are as long as the stairways.  Check any carpeting to make sure that it is firmly attached to the stairs. Fix any carpet that is loose or worn.  Avoid having throw rugs at the top or bottom of the stairs. If you do have throw rugs, attach them to the floor with carpet tape.  Make sure that you have a light switch at the top of the stairs and the bottom of the stairs. If you do not have them, ask someone to add them for you. What else can I do to help prevent falls?  Wear shoes that:  Do not have high heels.  Have rubber bottoms.  Are comfortable and fit you well.  Are closed at the toe. Do not wear sandals.  If you use a stepladder:  Make sure that it is fully opened. Do not climb a closed stepladder.  Make sure that both  sides of the stepladder are locked into place.  Ask someone to hold it for you, if possible.  Clearly mark and make sure that you can see:  Any grab bars or handrails.  First and last steps.  Where the edge of each step is.  Use tools that help you move around (mobility aids) if they are needed. These include:  Canes.  Walkers.  Scooters.  Crutches.  Turn on the lights when you go into a dark area. Replace any light bulbs as soon as they burn out.  Set up your furniture so you have a clear path. Avoid moving your furniture around.  If any of your floors are uneven, fix them.  If there are any pets around you, be aware of where they are.  Review your medicines with your doctor. Some medicines can make you feel dizzy. This can increase your chance of falling. Ask your doctor what other things that you can do to help prevent falls. This information is not intended to replace advice given to you by your health care provider. Make sure you discuss any questions you have with your health care provider. Document Released: 03/16/2009 Document Revised: 10/26/2015 Document Reviewed: 06/24/2014 Elsevier Interactive Patient Education  2017 [employer].

## 2019-07-28 ENCOUNTER — Ambulatory Visit (INDEPENDENT_AMBULATORY_CARE_PROVIDER_SITE_OTHER): Payer: Medicare Other | Admitting: Nurse Practitioner

## 2019-07-28 ENCOUNTER — Encounter: Payer: Self-pay | Admitting: Nurse Practitioner

## 2019-07-28 DIAGNOSIS — N1832 Chronic kidney disease, stage 3b: Secondary | ICD-10-CM

## 2019-07-28 DIAGNOSIS — I5022 Chronic systolic (congestive) heart failure: Secondary | ICD-10-CM

## 2019-07-28 DIAGNOSIS — I130 Hypertensive heart and chronic kidney disease with heart failure and stage 1 through stage 4 chronic kidney disease, or unspecified chronic kidney disease: Secondary | ICD-10-CM

## 2019-07-28 DIAGNOSIS — I714 Abdominal aortic aneurysm, without rupture, unspecified: Secondary | ICD-10-CM

## 2019-07-28 DIAGNOSIS — Z8679 Personal history of other diseases of the circulatory system: Secondary | ICD-10-CM

## 2019-07-28 DIAGNOSIS — Z95810 Presence of automatic (implantable) cardiac defibrillator: Secondary | ICD-10-CM

## 2019-07-28 DIAGNOSIS — N183 Chronic kidney disease, stage 3 unspecified: Secondary | ICD-10-CM

## 2019-07-28 DIAGNOSIS — E782 Mixed hyperlipidemia: Secondary | ICD-10-CM

## 2019-07-28 NOTE — Assessment & Plan Note (Signed)
Chronic, ongoing. Risks and benefits of low dose CT discussed- pt declined. Continue BP management as prescribed and monitor for symptoms.

## 2019-07-28 NOTE — Assessment & Plan Note (Signed)
Continue collaboration with cardiology for checks. 

## 2019-07-28 NOTE — Patient Instructions (Signed)
Fat and Cholesterol Restricted Eating Plan Getting too much fat and cholesterol in your diet may cause health problems. Choosing the right foods helps keep your fat and cholesterol at normal levels. This can keep you from getting certain diseases. Your doctor may recommend an eating plan that includes:  Total fat: ______% or less of total calories a day.  Saturated fat: ______% or less of total calories a day.  Cholesterol: less than _________mg a day.  Fiber: ______g a day. What are tips for following this plan? Meal planning  At meals, divide your plate into four equal parts: ? Fill one-half of your plate with vegetables and green salads. ? Fill one-fourth of your plate with whole grains. ? Fill one-fourth of your plate with low-fat (lean) protein foods.  Eat fish that is high in omega-3 fats at least two times a week. This includes mackerel, tuna, sardines, and salmon.  Eat foods that are high in fiber, such as whole grains, beans, apples, broccoli, carrots, peas, and barley. General tips   Work with your doctor to lose weight if you need to.  Avoid: ? Foods with added sugar. ? Fried foods. ? Foods with partially hydrogenated oils.  Limit alcohol intake to no more than 1 drink a day for nonpregnant women and 2 drinks a day for men. One drink equals 12 oz of beer, 5 oz of wine, or 1 oz of hard liquor. Reading food labels  Check food labels for: ? Trans fats. ? Partially hydrogenated oils. ? Saturated fat (g) in each serving. ? Cholesterol (mg) in each serving. ? Fiber (g) in each serving.  Choose foods with healthy fats, such as: ? Monounsaturated fats. ? Polyunsaturated fats. ? Omega-3 fats.  Choose grain products that have whole grains. Look for the word "whole" as the first word in the ingredient list. Cooking  Cook foods using low-fat methods. These include baking, boiling, grilling, and broiling.  Eat more home-cooked foods. Eat at restaurants and buffets  less often.  Avoid cooking using saturated fats, such as butter, cream, palm oil, palm kernel oil, and coconut oil. Recommended foods  Fruits  All fresh, canned (in natural juice), or frozen fruits. Vegetables  Fresh or frozen vegetables (raw, steamed, roasted, or grilled). Green salads. Grains  Whole grains, such as whole wheat or whole grain breads, crackers, cereals, and pasta. Unsweetened oatmeal, bulgur, barley, quinoa, or brown rice. Corn or whole wheat flour tortillas. Meats and other protein foods  Ground beef (85% or leaner), grass-fed beef, or beef trimmed of fat. Skinless chicken or turkey. Ground chicken or turkey. Pork trimmed of fat. All fish and seafood. Egg whites. Dried beans, peas, or lentils. Unsalted nuts or seeds. Unsalted canned beans. Nut butters without added sugar or oil. Dairy  Low-fat or nonfat dairy products, such as skim or 1% milk, 2% or reduced-fat cheeses, low-fat and fat-free ricotta or cottage cheese, or plain low-fat and nonfat yogurt. Fats and oils  Tub margarine without trans fats. Light or reduced-fat mayonnaise and salad dressings. Avocado. Olive, canola, sesame, or safflower oils. The items listed above may not be a complete list of foods and beverages you can eat. Contact a dietitian for more information. Foods to avoid Fruits  Canned fruit in heavy syrup. Fruit in cream or butter sauce. Fried fruit. Vegetables  Vegetables cooked in cheese, cream, or butter sauce. Fried vegetables. Grains  White bread. White pasta. White rice. Cornbread. Bagels, pastries, and croissants. Crackers and snack foods that contain trans fat   and hydrogenated oils. Meats and other protein foods  Fatty cuts of meat. Ribs, chicken wings, bacon, sausage, bologna, salami, chitterlings, fatback, hot dogs, bratwurst, and packaged lunch meats. Liver and organ meats. Whole eggs and egg yolks. Chicken and turkey with skin. Fried meat. Dairy  Whole or 2% milk, cream,  half-and-half, and cream cheese. Whole milk cheeses. Whole-fat or sweetened yogurt. Full-fat cheeses. Nondairy creamers and whipped toppings. Processed cheese, cheese spreads, and cheese curds. Beverages  Alcohol. Sugar-sweetened drinks such as sodas, lemonade, and fruit drinks. Fats and oils  Butter, stick margarine, lard, shortening, ghee, or bacon fat. Coconut, palm kernel, and palm oils. Sweets and desserts  Corn syrup, sugars, honey, and molasses. Candy. Jam and jelly. Syrup. Sweetened cereals. Cookies, pies, cakes, donuts, muffins, and ice cream. The items listed above may not be a complete list of foods and beverages you should avoid. Contact a dietitian for more information. Summary  Choosing the right foods helps keep your fat and cholesterol at normal levels. This can keep you from getting certain diseases.  At meals, fill one-half of your plate with vegetables and green salads.  Eat high-fiber foods, like whole grains, beans, apples, carrots, peas, and barley.  Limit added sugar, saturated fats, alcohol, and fried foods. This information is not intended to replace advice given to you by your health care provider. Make sure you discuss any questions you have with your health care provider. Document Revised: 01/21/2018 Document Reviewed: 02/04/2017 Elsevier Patient Education  2020 Elsevier Inc.  

## 2019-07-28 NOTE — Assessment & Plan Note (Signed)
Chronic, ongoing.  BP remains below goal for age.  Continue current medication regimen and collaboration with cardiology and nephrology. CMP & lipid outpatient. Return in 6 months.

## 2019-07-28 NOTE — Assessment & Plan Note (Signed)
Stable, ICD in place.  Continue collaboration with cardiology team. 

## 2019-07-28 NOTE — Assessment & Plan Note (Signed)
Chronic, ongoing.  Continue collaboration with nephrology and review recommendations.  CMP outpatient.

## 2019-07-28 NOTE — Assessment & Plan Note (Signed)
Continue collaboration with cardiology and review recommendations. - Reminded to call for an overnight weight gain of >2 pounds or a weekly weight weight of >5 pounds - not adding salt to his food and has been reading food labels. Reviewed the importance of keeping daily sodium intake to 2000mg  daily

## 2019-07-28 NOTE — Progress Notes (Signed)
There were no vitals taken for this visit.   Subjective:    Patient ID: Roy Palmer, male    DOB: March 27, 1930, 86 y.o.   MRN: [id number]  HPI: Roy Palmer is a 86 y.o. male  Chief Complaint  Patient presents with  . Hyperlipidemia  . Hypertension  . Chronic Kidney Disease    . This visit was completed via telephone only due to the restrictions of the COVID-19 pandemic. All issues as above were discussed and addressed. Physical exam was done as above through visual confirmation on 15. If it was felt that the patient should be evaluated in the office, they were directed there. The patient verbally consented to this visit. . Location of the patient: home . Location of the provider: work . Those involved with this call:  . Provider: Marnee Guarneri, DNP . CMA: Yvonna Alanis, CMA . Front Desk/Registration: Don Perking  . Time spent on call: 15 minutes with patient face to face via video conference. More than 50% of this time was spent in counseling and coordination of care. 10 minutes total spent in review of patient's record and preparation of their chart.  . I verified patient identity using two factors (patient name and date of birth). Patient consents verbally to being seen via telemedicine visit today.    HYPERTENSION / HYPERLIPIDEMIA WITH CHF He is followed by cardiology and was last seen by Dr. Lovena Le on 06/17/2019. In 2018 his EF was 25-30%. History of v tach and has ICD in place.  On review of cardiology note patient is intolerant to all classes HF therapy. At this time is on Norvasc and Pacerone + ASA. He has a known abdominal aortic aneurysm noted on 2017 CT measuring 3.5 AP at that time. Discussed risks and benefits of low dose CT forAAAmonitoring- pt declined at this time.Recommended DASH diet, education provided.Currently taking Amiodarone, ASA, Amlodipine. Not taking statin, does not wish to.  Last cholesterol levels elevated and he reported wishes not  to start medication. Satisfied with current treatment? yes Duration of hypertension: chronic BP monitoring frequency: not checking BP range: 112/62 recently at urgent care BP medication side effects: no Duration of hyperlipidemia: chronic Cholesterol medication side effects: no Cholesterol supplements: none Aspirin: yes Recent stressors: no Recurrent headaches: no Visual changes: no Palpitations: no Dyspnea: no Chest pain: no Lower extremity edema: no Dizzy/lightheaded: no   CHRONIC KIDNEY DISEASE III Has been seen by nephrology, last in March.  August CRT 1.86, GFR 32.  States the kidney told him there "is not much they can do, at my age I will live awhile".   CKD status: stable Medications renally dose: no Previous renal evaluation: yes Pneumovax:  Up to Date Influenza Vaccine:  Up to Date   Relevant past medical, surgical, family and social history reviewed and updated as indicated. Interim medical history since our last visit reviewed. Allergies and medications reviewed and updated.  Review of Systems  Constitutional: Negative for activity change, diaphoresis, fatigue and fever.  Respiratory: Negative for cough, chest tightness, shortness of breath and wheezing.   Cardiovascular: Negative for chest pain, palpitations and leg swelling.  Gastrointestinal: Negative.   Neurological: Negative.   Psychiatric/Behavioral: Negative.     Per HPI unless specifically indicated above     Objective:    There were no vitals taken for this visit.  Wt Readings from Last 3 Encounters:  06/17/19 189 lb 9.6 oz (86 kg)  03/17/19 189 lb (85.7 kg)  01/20/19 188 lb (  85.3 kg)    Physical Exam   Unable to perform due to telephone visit only.  Results for orders placed or performed in visit on 06/21/19  CUP PACEART REMOTE DEVICE CHECK  Result Value Ref Range   Date Time Interrogation Session [id number]    Pulse Generator Manufacturer MERM    Pulse Gen Model [id number] Evera MRI  XT DR    Pulse Gen Serial Number [id number]    Clinic Name [facility]    Implantable Pulse Generator Type Implantable Cardiac Defibulator    Implantable Pulse Generator Implant Date [id number]    Implantable Lead Manufacturer MERM    Implantable Lead Model 5076 CapSureFix Novus MRI SureScan    Implantable Lead Serial Number [id number]    Implantable Lead Implant Date [id number]    Implantable Lead Location Detail 1 APPENDAGE    Implantable Lead Location [id number]    Implantable Lead Manufacturer [facility]    Implantable Lead Model [id number] Sprint Quattro Secure S MRI SureScan    Implantable Lead Serial Number [id number]    Implantable Lead Implant Date [id number]    Implantable Lead Location Detail 1 APEX    Implantable Lead Location [id number]    Lead Channel Setting Sensing Sensitivity 0.3 mV   Lead Channel Setting Pacing Amplitude 2 V   Lead Channel Setting Pacing Pulse Width 0.4 ms   Lead Channel Setting Pacing Amplitude 2.5 V   Lead Channel Impedance Value 361 ohm   Lead Channel Sensing Intrinsic Amplitude 2 mV   Lead Channel Sensing Intrinsic Amplitude 2 mV   Lead Channel Pacing Threshold Amplitude 0.5 V   Lead Channel Pacing Threshold Pulse Width 0.4 ms   Lead Channel Impedance Value 513 ohm   Lead Channel Impedance Value 418 ohm   Lead Channel Sensing Intrinsic Amplitude 5.5 mV   Lead Channel Sensing Intrinsic Amplitude 5.5 mV   Lead Channel Pacing Threshold Amplitude 0.625 V   Lead Channel Pacing Threshold Pulse Width 0.4 ms   HighPow Impedance 76 ohm   Battery Status OK    Battery Remaining Longevity 63 mo   Battery Voltage 2.97 V   Brady Statistic RA Percent Paced 76.89 %   Brady Statistic RV Percent Paced 99.87 %   Brady Statistic AP VP Percent 76.9 %   Brady Statistic AS VP Percent 23.01 %   Brady Statistic AP VS Percent 0.04 %   Brady Statistic AS VS Percent 0.05 %      Assessment & Plan:   Problem List Items Addressed This Visit      Cardiovascular and Mediastinum   AAA  (abdominal aortic aneurysm) without rupture (HCC)    Chronic, ongoing. Risks and benefits of low dose CT discussed- pt declined. Continue BP management as prescribed and monitor for symptoms.         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  BP remains below goal for age.  Continue current medication regimen and collaboration with cardiology and nephrology. CMP & lipid outpatient. Return in 6 months.       Chronic systolic heart failure (HCC)    Continue collaboration with cardiology and review recommendations. - Reminded to call for an overnight weight gain of >2 pounds or a weekly weight weight of >5 pounds - not adding salt to his food and has been reading food labels. Reviewed the importance of keeping daily sodium intake to 2000mg  daily  Genitourinary   CKD (chronic kidney disease), stage III    Chronic, ongoing.  Continue collaboration with nephrology and review recommendations.  CMP outpatient.      Relevant Orders   Comprehensive metabolic panel     Other   Hyperlipidemia    Chronic, no statin use, refuses.  With advanced age this is appropriate, will continue to monitor and focus on diet.      Relevant Orders   Comprehensive metabolic panel   Lipid Panel Piccolo, Waived   History of ventricular tachycardia    Stable, ICD in place.  Continue collaboration with cardiology team.      ICD (implantable cardioverter-defibrillator) in place    Continue collaboration with cardiology for checks.         I discussed the assessment and treatment plan with the patient. The patient was provided an opportunity to ask questions and all were answered. The patient agreed with the plan and demonstrated an understanding of the instructions.   The patient was advised to call back or seek an in-person evaluation if the symptoms worsen or if the condition fails to improve as anticipated.   I provided 15+  minutes of time during this encounter.  Follow up plan: Return in about 6 months (around 01/25/2020) for HTN/HLD, Heart, CKD.

## 2019-07-28 NOTE — Assessment & Plan Note (Signed)
Chronic, no statin use, refuses.  With advanced age this is appropriate, will continue to monitor and focus on diet. 

## 2019-07-29 ENCOUNTER — Other Ambulatory Visit: Payer: Medicare Other

## 2019-07-29 ENCOUNTER — Other Ambulatory Visit: Payer: Self-pay

## 2019-07-29 DIAGNOSIS — E782 Mixed hyperlipidemia: Secondary | ICD-10-CM

## 2019-07-29 DIAGNOSIS — N1832 Chronic kidney disease, stage 3b: Secondary | ICD-10-CM

## 2019-07-29 LAB — LIPID PANEL PICCOLO, WAIVED
Chol/HDL Ratio Piccolo,Waive: 3.9 mg/dL
Cholesterol Piccolo, Waived: 188 mg/dL (ref <200)
HDL Chol Piccolo, Waived: 48 mg/dL — ABNORMAL LOW (ref >59)
LDL Chol Calc Piccolo Waived: 77 mg/dL (ref <100)
Triglycerides Piccolo,Waived: 320 mg/dL — ABNORMAL HIGH (ref <150)
VLDL Chol Calc Piccolo,Waive: 64 mg/dL — ABNORMAL HIGH (ref <30)

## 2019-07-30 LAB — COMPREHENSIVE METABOLIC PANEL
ALT: 26 IU/L (ref 0–44)
AST: 20 IU/L (ref 0–40)
Albumin/Globulin Ratio: 1.8 (ref 1.2–2.2)
Albumin: 4 g/dL (ref 3.6–4.6)
Alkaline Phosphatase: 69 IU/L (ref 39–117)
BUN/Creatinine Ratio: 17 (ref 10–24)
BUN: 27 mg/dL (ref 8–27)
Bilirubin Total: 0.3 mg/dL (ref 0.0–1.2)
CO2: 20 mmol/L (ref 20–29)
Calcium: 8.6 mg/dL (ref 8.6–10.2)
Chloride: 105 mmol/L (ref 96–106)
Creatinine, Ser: 1.63 mg/dL — ABNORMAL HIGH (ref 0.76–1.27)
GFR calc Af Amer: 43 mL/min/{1.73_m2} — ABNORMAL LOW (ref >59)
GFR calc non Af Amer: 37 mL/min/{1.73_m2} — ABNORMAL LOW (ref >59)
Globulin, Total: 2.2 g/dL (ref 1.5–4.5)
Glucose: 131 mg/dL — ABNORMAL HIGH (ref 65–99)
Potassium: 4.8 mmol/L (ref 3.5–5.2)
Sodium: 139 mmol/L (ref 134–144)
Total Protein: 6.2 g/dL (ref 6.0–8.5)

## 2019-07-30 NOTE — Progress Notes (Signed)
Contacted via MyChart

## 2019-08-02 LAB — CUP PACEART INCLINIC DEVICE CHECK
Brady Statistic AP VP Percent: 79.1 %
Brady Statistic AP VS Percent: 0.1 % — CL
Brady Statistic AS VP Percent: 20.8 %
Brady Statistic AS VS Percent: 0.1 % — CL
Brady Statistic RA Percent Paced: 50 %
Brady Statistic RV Percent Paced: 99.7 %
Date Time Interrogation Session: 20210114090656
Implantable Lead Implant Date: 20171025
Implantable Lead Implant Date: 20171025
Implantable Lead Location: 753859
Implantable Lead Location: 753860
Implantable Lead Model: 5076
Implantable Pulse Generator Implant Date: 20171025
Lead Channel Pacing Threshold Amplitude: 0.5 V
Lead Channel Pacing Threshold Amplitude: 0.75 V
Lead Channel Pacing Threshold Pulse Width: 0.4 ms
Lead Channel Pacing Threshold Pulse Width: 0.4 ms
Lead Channel Sensing Intrinsic Amplitude: 2.9 mV
Lead Channel Sensing Intrinsic Amplitude: 5.5 mV
Lead Channel Setting Pacing Amplitude: 2 V
Lead Channel Setting Pacing Amplitude: 2.5 V
Lead Channel Setting Pacing Pulse Width: 0.4 ms
Lead Channel Setting Sensing Sensitivity: 0.3 mV

## 2019-08-09 ENCOUNTER — Other Ambulatory Visit: Payer: Self-pay

## 2019-08-09 MED ORDER — AMLODIPINE BESYLATE 2.5 MG PO TABS: ORAL_TABLET | ORAL | 0 refills | Status: DC

## 2019-09-20 ENCOUNTER — Ambulatory Visit (INDEPENDENT_AMBULATORY_CARE_PROVIDER_SITE_OTHER): Payer: Medicare Other | Admitting: *Deleted

## 2019-09-20 DIAGNOSIS — I472 Ventricular tachycardia, unspecified: Secondary | ICD-10-CM

## 2019-09-20 LAB — CUP PACEART REMOTE DEVICE CHECK
Battery Remaining Longevity: 57 mo
Battery Voltage: 2.97 V
Brady Statistic AP VP Percent: 73.8 %
Brady Statistic AP VS Percent: 0.03 %
Brady Statistic AS VP Percent: 26.13 %
Brady Statistic AS VS Percent: 0.04 %
Brady Statistic RA Percent Paced: 73.72 %
Brady Statistic RV Percent Paced: 99.84 %
Date Time Interrogation Session: 20210419092055
HighPow Impedance: 70 Ohm
Implantable Lead Implant Date: 20171025
Implantable Lead Implant Date: 20171025
Implantable Lead Location: 753859
Implantable Lead Location: 753860
Implantable Lead Model: 5076
Implantable Pulse Generator Implant Date: 20171025
Lead Channel Impedance Value: 361 Ohm
Lead Channel Impedance Value: 361 Ohm
Lead Channel Impedance Value: 456 Ohm
Lead Channel Pacing Threshold Amplitude: 0.625 V
Lead Channel Pacing Threshold Amplitude: 0.75 V
Lead Channel Pacing Threshold Pulse Width: 0.4 ms
Lead Channel Pacing Threshold Pulse Width: 0.4 ms
Lead Channel Sensing Intrinsic Amplitude: 2.625 mV
Lead Channel Sensing Intrinsic Amplitude: 2.625 mV
Lead Channel Sensing Intrinsic Amplitude: 5.5 mV
Lead Channel Sensing Intrinsic Amplitude: 5.5 mV
Lead Channel Setting Pacing Amplitude: 2 V
Lead Channel Setting Pacing Amplitude: 2.5 V
Lead Channel Setting Pacing Pulse Width: 0.4 ms
Lead Channel Setting Sensing Sensitivity: 0.3 mV

## 2019-09-21 NOTE — Progress Notes (Signed)
ICD Remote  

## 2019-10-06 ENCOUNTER — Encounter: Payer: Self-pay | Admitting: Internal Medicine

## 2019-10-06 ENCOUNTER — Ambulatory Visit (INDEPENDENT_AMBULATORY_CARE_PROVIDER_SITE_OTHER): Payer: Medicare Other | Admitting: Internal Medicine

## 2019-10-06 ENCOUNTER — Other Ambulatory Visit: Payer: Self-pay

## 2019-10-06 VITALS — BP 130/72 | HR 68 | Ht 71.0 in | Wt 190.2 lb

## 2019-10-06 DIAGNOSIS — I472 Ventricular tachycardia, unspecified: Secondary | ICD-10-CM

## 2019-10-06 DIAGNOSIS — I10 Essential (primary) hypertension: Secondary | ICD-10-CM

## 2019-10-06 DIAGNOSIS — I5022 Chronic systolic (congestive) heart failure: Secondary | ICD-10-CM | POA: Diagnosis not present

## 2019-10-06 DIAGNOSIS — I340 Nonrheumatic mitral (valve) insufficiency: Secondary | ICD-10-CM | POA: Diagnosis not present

## 2019-10-06 DIAGNOSIS — I428 Other cardiomyopathies: Secondary | ICD-10-CM

## 2019-10-06 NOTE — Patient Instructions (Signed)
Medication Instructions:  Your physician recommends that you continue on your current medications as directed. Please refer to the Current Medication list given to you today.  *If you need a refill on your cardiac medications before your next appointment, please call your pharmacy*  Follow-Up: At [facility], you and your health needs are our priority.  As part of our continuing mission to provide you with exceptional heart care, we have created designated Provider Care Teams.  These Care Teams include your primary Cardiologist (physician) and Advanced Practice Providers (APPs -  Physician Assistants and Nurse Practitioners) who all work together to provide you with the care you need, when you need it.  We recommend signing up for the patient portal called "MyChart".  Sign up information is provided on this After Visit Summary.  MyChart is used to connect with patients for Virtual Visits (Telemedicine).  Patients are able to view lab/test results, encounter notes, upcoming appointments, etc.  Non-urgent messages can be sent to your provider as well.   To learn more about what you can do with MyChart, go to [email or web address].    Your next appointment:   6 month(s)  The format for your next appointment:   In Person  Provider:    You may see DR CHRISTOPHER END or one of the following Advanced Practice Providers on your designated Care Team:    Christopher Berge, NP  Ryan Dunn, PA-C  Jacquelyn Visser, PA-C   

## 2019-10-06 NOTE — Progress Notes (Signed)
Follow-up Outpatient Visit Date: 10/06/2019  Primary Care Provider: Venita Lick, NP [place] [address]  Chief Complaint: Follow-up valvular heart disease and nonischemic cardiomyopathy  HPI:  Roy Palmer is a 86 y.o. male with history of severe mitral regurgitation status post mitral valve repair (03/2016),ventriculartachycardia status post ICD (03/2016), nonischemic cardiomyopathywith chronic systolic heart failure, nonobstructive CAD, hypertension, hyperlipidemia,andchronic kidney diseaseIII, who presents for follow-up of heart failure and valvular heart disease.  I last saw him in 03/2019, at which time Roy Palmer was feeling relatively well other than noting that his legs seemed to be moving more slowly at times.  He was seen by Dr. Lovena Le in 06/2019 for follow-up of his history of sustained VT s/p ICD and was noted to be doing well.  Roy Palmer again declined BiV upgrade.  Today, Roy Palmer reports that he has continued to do well.  He does not have any chest pain, shortness of breath, palpitations, lightheadedness, or edema.  He is tolerating his current regimen of aspirin, amiodarone, and amlodipine well.  --------------------------------------------------------------------------------------------------  Cardiovascular History & Procedures: Cardiovascular Problems:  Severe mitral regurgitation status post repair (25/3664)  Chronic systolic heart failure secondary to nonischemic cardiomyopathy  Frequent ventricular ectopy with sustained ventricular tachycardia status post ICD  Nonobstructive coronary artery disease  Risk Factors:  Age, male gender, hypertension, and hyperlipidemia  Cath/PCI:  LHC/RHC (02/23/16): Mild, nonobstructive CAD (30% ostial LMCA, 30% ostial LAD, 30% ostial ramus, and minimal luminal irregularities of dominant LCx. Normal right heart filling pressures. Decreased Fick cardiac output (CO 3.6 L/m, CI 1.8 L/m/m) in setting  of frequent PVCs and severe MR.  CV Surgery:  Minimally invasive mitral valve repair (03/19/16, Dr. Roxy Manns)  EP Procedures and Devices:  24-hour Holter monitor (02/27/16): Predominantly sinus rhythm with average heart rate is 63 bpm (range 49-91 bpm). Longest RR interval 1.6 seconds. Frequent PVCs noted (11% burden) including frequent couplets and bigeminal cycles. 90 runs of NSVT were noted lasting up to 9 beats the maximum rate of 134 bpm. Rare SVT and supraventricular ectopy noted.  Dual chamber ICD (03/27/16, [employer])  Non-Invasive Evaluation(s):  Left arm venous Duplex (07/06/19): No evidence of DVT.  TTE (04/25/17): Mildly dilated LV with moderate LVH. LVEF 25-30% with diffuse hypokinesis. Aortic sclerosis. Moderately elevated transmitral gradient status post repair. Moderate mitral regurgitation. Mild to moderate left atrial enlargement. Mildly reduced RV contraction.  Limited TTE (02/11/17): LVEF 25-30% with probable akinesis of the inferolateral and inferior myocardium. Grade 2 diastolic dysfunction. Mild LA enlargement.  TTE (09/03/16): Moderately dilated left ventricle with LVEF less than 20% and global hypokinesis. Prior surgical repair of mitral valve is evident with moderate regurgitation. Left atrium is moderately dilated. RV size and function are normal. Moderate pulmonary hypertension noted with RVSP of 54 mmHg.  Limited TTE (03/22/16): Normal LV size with moderate LVH and LVEF of 40-45% with possible inferior hypokinesis and incoordinate septal motion. Mitral angioplasty ring in place with trivial MR. No significant mitral stenosis. Mild left atrial enlargement. Normal RV was moderately reduced systolic function. RVSP 41 mm or mercury. Elevated central venous pressure.  TTE (02/14/16): Normal LV size with moderate LVH. EF mildly reduced at 50% with possible inferolateral hypokinesis. Moderate to severe MR noted with prolapse/flail of the posterior leaflet. Severe left atrial  enlargement. Moderate TR.  TEE (02/23/16): Normal LV size and wall thickness with lower normal LVEF. Trivial AI. Severe, eccentric MR with reversal of pulmonary vein flow and flail posterior leaflet. No left  atrial thrombus. Lipomatous hypertrophy of the septum. Mild to moderate TR.  Limited TTE (03/22/16): Normal LV size with moderate LVH. LVEF 40-45% with possible inferior hypokinesis. Trivial MR with mild left atrial enlargement. Normal RV size with moderately reduced systolic function. Mild TR. No pericardial effusion.  Recent CV Pertinent Labs: Lab Results  Component Value Date   CHOL 188 07/29/2019   HDL 40 01/20/2019   LDLCALC 112 (H) 01/20/2019   TRIG 320 (H) 07/29/2019   CHOLHDL 4.1 04/26/2017   INR 1.11 04/25/2017   BNP 805.0 (H) 09/12/2016   K 4.8 07/29/2019   MG 2.2 04/24/2017   BUN 27 07/29/2019   CREATININE 1.63 (H) 07/29/2019    Past medical and surgical history were reviewed and updated in EPIC.  Current Meds  Medication Sig  . amiodarone (PACERONE) 200 MG tablet TAKE 1 TABLET BY MOUTH EVERY DAY MONDAY THROUGH SATURDAY. DO NOT TAKE ON SUNDAY  . amLODipine (NORVASC) 2.5 MG tablet TAKE 1 TABLET(2.5 MG) BY MOUTH DAILY  . aspirin EC 81 MG tablet Take 1 tablet (81 mg total) by mouth daily.    Allergies: Amiodarone and Lyrica [pregabalin]  Social History   Tobacco Use  . Smoking status: Never Smoker  . Smokeless tobacco: Never Used  Substance Use Topics  . Alcohol use: No  . Drug use: No    Family History  Problem Relation Age of Onset  . Hypertension Mother   . Stroke Mother   . Heart disease Father   . Heart attack Father   . Dementia Sister   . Retinoblastoma Son   . COPD Neg Hx   . Cancer Neg Hx   . Diabetes Neg Hx     Review of Systems: A 12-system review of systems was performed and was negative except as noted in the HPI.  --------------------------------------------------------------------------------------------------  Physical Exam: BP  130/72 (BP Location: Left Arm, Patient Position: Sitting, Cuff Size: Normal)   Pulse 68   Ht 5\' 11"  (1.803 m)   Wt 190 lb 4 oz (86.3 kg)   SpO2 95%   BMI 26.53 kg/m   General: NAD. Neck: No JVD or HJR. Lungs: Clear to auscultation bilaterally without wheezes or crackles. Heart: Regular rate and rhythm with 2/6 holosystolic murmur loudest at the left lower sternal border.  No rubs or gallops. Abdomen: Soft, nontender, nondistended. Extremities: No lower extremity edema.  EKG: AV paced rhythm.  Lab Results  Component Value Date   WBC 6.9 07/22/2018   HGB 15.4 07/22/2018   HCT 46.7 07/22/2018   MCV 93 07/22/2018   PLT 195 07/22/2018    Lab Results  Component Value Date   NA 139 07/29/2019   K 4.8 07/29/2019   CL 105 07/29/2019   CO2 20 07/29/2019   BUN 27 07/29/2019   CREATININE 1.63 (H) 07/29/2019   GLUCOSE 131 (H) 07/29/2019   ALT 26 07/29/2019    Lab Results  Component Value Date   CHOL 188 07/29/2019   HDL 40 01/20/2019   LDLCALC 112 (H) 01/20/2019   TRIG 320 (H) 07/29/2019   CHOLHDL 4.1 04/26/2017    --------------------------------------------------------------------------------------------------  ASSESSMENT AND PLAN: Chronic HFrEF due to nonischemic cardiomyopathy and mitral regurgitation: Roy Palmer appears euvolemic and well compensated with NYHA class I-II symptoms.  He has been intolerant of all classes of evidence-based heart failure therapy in the past.  Given that he continues to do well, we will defer any medication changes today.  Continue low-dose aspirin status post mitral valve  repair.  Ventricular tachycardia: No recurrence of ventricular tachycardia.  Continue low-dose amiodarone and device follow-up per Dr. Ladona Ridgel.  Hypertension: Blood pressure upper normal today.  No medication changes at this time.  Follow-up: Return to clinic in 6 months.  [name], MD 10/07/2019 8:20 AM

## 2019-10-07 ENCOUNTER — Encounter: Payer: Self-pay | Admitting: Internal Medicine

## 2019-10-20 ENCOUNTER — Emergency Department: Payer: Medicare Other

## 2019-10-20 ENCOUNTER — Inpatient Hospital Stay
Admission: EM | Admit: 2019-10-20 | Discharge: 2019-10-24 | DRG: 871 | Disposition: A | Discharge status: Home / Self Care | Payer: Medicare Other | Attending: Internal Medicine | Admitting: Internal Medicine

## 2019-10-20 ENCOUNTER — Other Ambulatory Visit: Payer: Self-pay

## 2019-10-20 ENCOUNTER — Encounter: Payer: Self-pay | Admitting: Emergency Medicine

## 2019-10-20 DIAGNOSIS — N1832 Chronic kidney disease, stage 3b: Secondary | ICD-10-CM | POA: Diagnosis present

## 2019-10-20 DIAGNOSIS — D696 Thrombocytopenia, unspecified: Secondary | ICD-10-CM | POA: Diagnosis present

## 2019-10-20 DIAGNOSIS — Z888 Allergy status to other drugs, medicaments and biological substances status: Secondary | ICD-10-CM | POA: Diagnosis not present

## 2019-10-20 DIAGNOSIS — I129 Hypertensive chronic kidney disease with stage 1 through stage 4 chronic kidney disease, or unspecified chronic kidney disease: Secondary | ICD-10-CM | POA: Diagnosis present

## 2019-10-20 DIAGNOSIS — Z8249 Family history of ischemic heart disease and other diseases of the circulatory system: Secondary | ICD-10-CM

## 2019-10-20 DIAGNOSIS — E878 Other disorders of electrolyte and fluid balance, not elsewhere classified: Secondary | ICD-10-CM | POA: Diagnosis present

## 2019-10-20 DIAGNOSIS — E785 Hyperlipidemia, unspecified: Secondary | ICD-10-CM | POA: Diagnosis present

## 2019-10-20 DIAGNOSIS — R197 Diarrhea, unspecified: Secondary | ICD-10-CM | POA: Diagnosis present

## 2019-10-20 DIAGNOSIS — I712 Thoracic aortic aneurysm, without rupture: Secondary | ICD-10-CM | POA: Diagnosis present

## 2019-10-20 DIAGNOSIS — Z7982 Long term (current) use of aspirin: Secondary | ICD-10-CM

## 2019-10-20 DIAGNOSIS — Z20822 Contact with and (suspected) exposure to covid-19: Secondary | ICD-10-CM | POA: Diagnosis present

## 2019-10-20 DIAGNOSIS — I714 Abdominal aortic aneurysm, without rupture: Secondary | ICD-10-CM | POA: Diagnosis present

## 2019-10-20 DIAGNOSIS — Z952 Presence of prosthetic heart valve: Secondary | ICD-10-CM

## 2019-10-20 DIAGNOSIS — E871 Hypo-osmolality and hyponatremia: Secondary | ICD-10-CM | POA: Diagnosis present

## 2019-10-20 DIAGNOSIS — I472 Ventricular tachycardia: Secondary | ICD-10-CM | POA: Diagnosis present

## 2019-10-20 DIAGNOSIS — R0902 Hypoxemia: Secondary | ICD-10-CM | POA: Diagnosis present

## 2019-10-20 DIAGNOSIS — J181 Lobar pneumonia, unspecified organism: Secondary | ICD-10-CM | POA: Diagnosis present

## 2019-10-20 DIAGNOSIS — J189 Pneumonia, unspecified organism: Secondary | ICD-10-CM

## 2019-10-20 DIAGNOSIS — I700 Atherosclerosis of aorta: Secondary | ICD-10-CM | POA: Diagnosis present

## 2019-10-20 DIAGNOSIS — A419 Sepsis, unspecified organism: Secondary | ICD-10-CM | POA: Diagnosis present

## 2019-10-20 DIAGNOSIS — I340 Nonrheumatic mitral (valve) insufficiency: Secondary | ICD-10-CM | POA: Diagnosis present

## 2019-10-20 DIAGNOSIS — Z95810 Presence of automatic (implantable) cardiac defibrillator: Secondary | ICD-10-CM

## 2019-10-20 DIAGNOSIS — E860 Dehydration: Secondary | ICD-10-CM | POA: Diagnosis present

## 2019-10-20 DIAGNOSIS — Z79899 Other long term (current) drug therapy: Secondary | ICD-10-CM | POA: Diagnosis not present

## 2019-10-20 DIAGNOSIS — J90 Pleural effusion, not elsewhere classified: Secondary | ICD-10-CM | POA: Diagnosis not present

## 2019-10-20 LAB — URINALYSIS, COMPLETE (UACMP) WITH MICROSCOPIC
Bilirubin Urine: NEGATIVE
Glucose, UA: NEGATIVE mg/dL
Ketones, ur: NEGATIVE mg/dL
Leukocytes,Ua: NEGATIVE
Nitrite: NEGATIVE
Protein, ur: NEGATIVE mg/dL
Specific Gravity, Urine: 1.011 (ref 1.005–1.030)
Squamous Epithelial / HPF: NONE SEEN (ref 0–5)
pH: 5 (ref 5.0–8.0)

## 2019-10-20 LAB — CBC
HCT: 40 % (ref 39.0–52.0)
Hemoglobin: 13.6 g/dL (ref 13.0–17.0)
MCH: 32 pg (ref 26.0–34.0)
MCHC: 34 g/dL (ref 30.0–36.0)
MCV: 94.1 fL (ref 80.0–100.0)
Platelets: 148 10*3/uL — ABNORMAL LOW (ref 150–400)
RBC: 4.25 MIL/uL (ref 4.22–5.81)
RDW: 14 % (ref 11.5–15.5)
WBC: 13.5 10*3/uL — ABNORMAL HIGH (ref 4.0–10.5)
nRBC: 0 % (ref 0.0–0.2)

## 2019-10-20 LAB — BASIC METABOLIC PANEL
Anion gap: 10 (ref 5–15)
BUN: 26 mg/dL — ABNORMAL HIGH (ref 8–23)
CO2: 22 mmol/L (ref 22–32)
Calcium: 8.2 mg/dL — ABNORMAL LOW (ref 8.9–10.3)
Chloride: 97 mmol/L — ABNORMAL LOW (ref 98–111)
Creatinine, Ser: 1.71 mg/dL — ABNORMAL HIGH (ref 0.61–1.24)
GFR calc Af Amer: 41 mL/min — ABNORMAL LOW (ref >60)
GFR calc non Af Amer: 35 mL/min — ABNORMAL LOW (ref >60)
Glucose, Bld: 136 mg/dL — ABNORMAL HIGH (ref 70–99)
Potassium: 4.4 mmol/L (ref 3.5–5.1)
Sodium: 129 mmol/L — ABNORMAL LOW (ref 135–145)

## 2019-10-20 LAB — SARS CORONAVIRUS 2 BY RT PCR (HOSPITAL ORDER, PERFORMED IN ~~LOC~~ HOSPITAL LAB): SARS Coronavirus 2: NEGATIVE

## 2019-10-20 LAB — LACTIC ACID, PLASMA: Lactic Acid, Venous: 1.1 mmol/L (ref 0.5–1.9)

## 2019-10-20 LAB — TROPONIN I (HIGH SENSITIVITY)
Troponin I (High Sensitivity): 20 ng/L — ABNORMAL HIGH (ref <18)
Troponin I (High Sensitivity): 24 ng/L — ABNORMAL HIGH (ref <18)

## 2019-10-20 MED ORDER — SODIUM CHLORIDE 0.9 % IV SOLN
100.0000 mg | Freq: Two times a day (BID) | INTRAVENOUS | Status: DC
  Administered 2019-10-21 (×2): 100 mg via INTRAVENOUS
  Filled 2019-10-20 (×5): qty 100

## 2019-10-20 MED ORDER — ASPIRIN EC 81 MG PO TBEC
81.0000 mg | DELAYED_RELEASE_TABLET | Freq: Every day | ORAL | Status: DC
  Administered 2019-10-21 – 2019-10-24 (×4): 81 mg via ORAL
  Filled 2019-10-20 (×4): qty 1

## 2019-10-20 MED ORDER — ONDANSETRON HCL 4 MG PO TABS: 4.0000 mg | ORAL_TABLET | Freq: Four times a day (QID) | ORAL | Status: DC | PRN

## 2019-10-20 MED ORDER — ENOXAPARIN SODIUM 40 MG/0.4ML ~~LOC~~ SOLN
40.0000 mg | SUBCUTANEOUS | Status: DC
  Administered 2019-10-21 – 2019-10-23 (×4): 40 mg via SUBCUTANEOUS
  Filled 2019-10-20 (×4): qty 0.4

## 2019-10-20 MED ORDER — AMIODARONE HCL 200 MG PO TABS
200.0000 mg | ORAL_TABLET | ORAL | Status: DC
  Administered 2019-10-21 – 2019-10-23 (×3): 200 mg via ORAL
  Filled 2019-10-20 (×4): qty 1

## 2019-10-20 MED ORDER — ACETAMINOPHEN 650 MG RE SUPP: 650.0000 mg | Freq: Four times a day (QID) | RECTAL | Status: DC | PRN

## 2019-10-20 MED ORDER — ACETAMINOPHEN 325 MG PO TABS
650.0000 mg | ORAL_TABLET | Freq: Four times a day (QID) | ORAL | Status: DC | PRN
  Administered 2019-10-21 – 2019-10-22 (×2): 650 mg via ORAL
  Filled 2019-10-20 (×2): qty 2

## 2019-10-20 MED ORDER — SODIUM CHLORIDE 0.9 % IV SOLN
2.0000 g | INTRAVENOUS | Status: DC
  Administered 2019-10-21: 2 g via INTRAVENOUS
  Filled 2019-10-20: qty 20
  Filled 2019-10-20: qty 2

## 2019-10-20 MED ORDER — ACETAMINOPHEN 325 MG PO TABS
650.0000 mg | ORAL_TABLET | Freq: Once | ORAL | Status: AC | PRN
  Administered 2019-10-20: 650 mg via ORAL
  Filled 2019-10-20: qty 2

## 2019-10-20 MED ORDER — ONDANSETRON HCL 4 MG/2ML IJ SOLN: 4.0000 mg | Freq: Four times a day (QID) | INTRAMUSCULAR | Status: DC | PRN

## 2019-10-20 MED ORDER — SODIUM CHLORIDE 0.9 % IV SOLN
2.0000 g | INTRAVENOUS | Status: DC
  Administered 2019-10-20: 2 g via INTRAVENOUS
  Filled 2019-10-20: qty 20

## 2019-10-20 MED ORDER — SODIUM CHLORIDE 0.9 % IV SOLN
500.0000 mg | INTRAVENOUS | Status: DC
Start: 2019-10-20 — End: 2019-10-20

## 2019-10-20 MED ORDER — AMLODIPINE BESYLATE 5 MG PO TABS
2.5000 mg | ORAL_TABLET | Freq: Every day | ORAL | Status: DC
  Administered 2019-10-21 – 2019-10-24 (×4): 2.5 mg via ORAL
  Filled 2019-10-20 (×4): qty 1

## 2019-10-20 MED ORDER — SODIUM CHLORIDE 0.9% FLUSH: 3.0000 mL | Freq: Once | INTRAVENOUS | Status: DC

## 2019-10-20 MED ORDER — SODIUM CHLORIDE 0.9 % IV SOLN
500.0000 mg | INTRAVENOUS | Status: DC
  Administered 2019-10-20: 500 mg via INTRAVENOUS
  Filled 2019-10-20: qty 500

## 2019-10-20 MED ORDER — SODIUM CHLORIDE 0.9 % IV SOLN: INTRAVENOUS | Status: DC

## 2019-10-20 MED ORDER — GUAIFENESIN ER 600 MG PO TB12
600.0000 mg | ORAL_TABLET | Freq: Two times a day (BID) | ORAL | Status: DC
  Administered 2019-10-21 – 2019-10-24 (×7): 600 mg via ORAL
  Filled 2019-10-20 (×8): qty 1

## 2019-10-20 MED ORDER — TRAZODONE HCL 50 MG PO TABS: 25.0000 mg | ORAL_TABLET | Freq: Every evening | ORAL | Status: DC | PRN

## 2019-10-20 MED ORDER — LACTATED RINGERS IV BOLUS
1000.0000 mL | Freq: Once | INTRAVENOUS | Status: AC
  Administered 2019-10-20: 1000 mL via INTRAVENOUS

## 2019-10-20 MED ORDER — MAGNESIUM HYDROXIDE 400 MG/5ML PO SUSP
30.0000 mL | Freq: Every day | ORAL | Status: DC | PRN
  Filled 2019-10-20: qty 30

## 2019-10-20 MED ORDER — LACTATED RINGERS IV BOLUS
1000.0000 mL | Freq: Once | INTRAVENOUS | Status: AC
  Administered 2019-10-21: 1000 mL via INTRAVENOUS

## 2019-10-20 NOTE — ED Triage Notes (Signed)
Pt to ED from home c/o weakness, shaking for 2 days.  States has had diarrhea 3 times today and twice yesterday, denies nausea or vomiting.  Seen at [facility] today and brought straight over to ED.  Pt states lost taste Monday when this started, has had both COVID vaccines, denies pain or SOB, skin WNL, chest rise even and unlabored, in NAD at this time.

## 2019-10-20 NOTE — Progress Notes (Signed)
CODE SEPSIS - PHARMACY COMMUNICATION  **Broad Spectrum Antibiotics should be administered within 1 hour of Sepsis diagnosis**  Time Code Sepsis Called/Page Received:  5/19 @ 2019   Antibiotics Ordered: Ceftriaxone 2 gm   Time of 1st antibiotic administration:    Additional action taken by pharmacy: 5/19@ 2026   If necessary, Name of Provider/Nurse Contacted:     Ranata Laughery D ,PharmD Clinical Pharmacist  10/20/2019  9:11 PM

## 2019-10-20 NOTE — ED Provider Notes (Signed)
[facility] Emergency Department Provider Note   ____________________________________________   First MD Initiated Contact with Patient 10/20/19 1916     (approximate)  I have reviewed the triage vital signs and the nursing notes.   HISTORY  Chief Complaint Weakness    HPI Roy Palmer is a 86 y.o. male with possible history of CHF, CKD, AAA, V. tach status post ICD who presents to the ED complaining of weakness and chills.  Patient reports that he has been feeling increasingly weak with shaking and chills for the past 2 days.  He had a few episodes of diarrhea yesterday and today, but denies any nausea, vomiting, or abdominal pain.  He has had a dry cough but denies any chest pain or shortness of breath.  He has received both of his Covid vaccines, is not aware of any recent sick contacts.        Past Medical History:  Diagnosis Date  . AAA (abdominal aortic aneurysm) without rupture ([place]) 03/12/2016   Small - measured 3.3 x 3.5 cm by CTA  . CKD (chronic kidney disease) stage 3, GFR 30-59 ml/min   . Essential hypertension   . Hyperlipidemia   . Hypertensive kidney disease   . Incidental pulmonary nodule 03/12/2016   Vague opacity right lung base requires radiographic follow up - index of suspicion is LOW  . Near syncope    Cardiogenic, related to an SVT and severe MR  . Non-sustained ventricular tachycardia ([place])   . S/P minimally invasive mitral valve repair 03/19/2016   Complex valvuloplasty including triangular resection of flail segment of posterior leaflet, chordal transposition x1, artificial Gore-tex neochord placement x4 and 34 mm Sorin Memo 3D ring annuloplasty via right mini thoracotomy approach with clipping of LA appendage  . Severe mitral regurgitation by prior echocardiogram 02/23/2016   Confirmed by TEE  . Syncope 03/15/2016  . Tennis elbow left  . Thoracic aortic aneurysm ([place]) 03/12/2016   Very very mild fusiform enlargement  of distal transverse and proximal descending thoracic aorta noted on CTA  . Trigeminal neuralgia     Patient Active Problem List   Diagnosis Date Noted  . Sepsis ([place]) 10/20/2019  . ICD (implantable cardioverter-defibrillator) in place 06/17/2019  . History of ventricular tachycardia 03/18/2019  . IFG (impaired fasting glucose) 01/20/2019  . Gastroesophageal reflux disease 07/22/2018  . Advanced care planning/counseling discussion 06/11/2017  . Chronic systolic heart failure ([place]) 10/22/2016  . Nonischemic cardiomyopathy ([place]) 05/07/2016  . Encounter for therapeutic drug monitoring 04/01/2016  . S/P minimally invasive mitral valve repair 03/19/2016  . 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 CKD (chronic kidney disease), stage III 03/18/2016  . Hyperlipidemia 03/18/2016  . Incidental pulmonary nodule 03/12/2016  . AAA (abdominal aortic aneurysm) without rupture ([place]) 03/12/2016  . Thoracic aortic aneurysm ([place]) 03/12/2016  . Ventricular tachycardia ([place])   . Symptomatic PVCs 02/24/2016  . Non-rheumatic mitral regurgitation 02/23/2016  . Near syncope -- cardiogenic 02/23/2016  . Right hip pain 02/02/2015    Past Surgical History:  Procedure Laterality Date  . CARDIAC CATHETERIZATION N/A 02/23/2016   Procedure: Right/Left Heart Cath and Coronary Angiography;  Surgeon: Nelva Bush, MD;  Location: [place] CV LAB;  Service: Cardiovascular: Angiographic minimal coronary disease. Normal left and right heart Pressures. Decreased CO/CI in settng of frequent PVCs & Severe MR.  Marland Kitchen CATARACT EXTRACTION, BILATERAL    . COLONOSCOPY  08/2005  . CYST EXCISION  10/2013   from neck  . EP IMPLANTABLE DEVICE N/A 03/27/2016   Procedure: ICD Implant Dual Chamber;  Surgeon: Marinus Maw, MD;  Location: [facility] INVASIVE CV LAB;  Service: Cardiovascular;  Laterality: N/A;  . ESOPHAGOGASTRODUODENOSCOPY  02/2010  . MITRAL VALVE REPAIR Right 03/19/2016    Procedure: MINIMALLY INVASIVE MITRAL VALVE REPAIR (MVR);  Surgeon: [name], MD;  Location: [facility] OR;  Service: Open Heart Surgery;  Laterality: Right;  . TEE WITHOUT CARDIOVERSION N/A 02/23/2016   Procedure: TRANSESOPHAGEAL ECHOCARDIOGRAM (TEE);  Surgeon: Quintella Reichert, MD;  Location: [facility] ENDOSCOPY;  Service: Cardiovascular: Normal LV size and function.  Degenerative mitral valve disease with failed posterior leaflet (P2 segment), Severe MR with pulmonary vein systolic flow reversal. Moderate-TR.    . TEE WITHOUT CARDIOVERSION N/A 03/19/2016   Procedure: TRANSESOPHAGEAL ECHOCARDIOGRAM (TEE);  Surgeon: [name], MD;  Location: [facility] OR;  Service: Open Heart Surgery;  Laterality: N/A;  . TRANSTHORACIC ECHOCARDIOGRAM  02/14/2016   EF 50-55% with moderate LVH. Possible inferolateral hypokinesis. Moderate-severe MR with posterior leaflet prolapse, cannot rule out flail. Severe LA dilation. Moderate TR.    Prior to Admission medications   Medication Sig Start Date End Date Taking? Authorizing Provider  amiodarone (PACERONE) 200 MG tablet TAKE 1 TABLET BY MOUTH EVERY DAY MONDAY THROUGH SATURDAY. DO NOT TAKE ON SUNDAY 12/01/18   End, Cristal Deer, MD  amLODipine (NORVASC) 2.5 MG tablet TAKE 1 TABLET(2.5 MG) BY MOUTH DAILY 08/09/19   Antonieta Iba, MD  aspirin EC 81 MG tablet Take 1 tablet (81 mg total) by mouth daily. 02/09/16   End, Cristal Deer, MD    Allergies Amiodarone and Lyrica [pregabalin]  Family History  Problem Relation Age of Onset  . Hypertension Mother   . Stroke Mother   . Heart disease Father   . Heart attack Father   . Dementia Sister   . Retinoblastoma Son   . COPD Neg Hx   . Cancer Neg Hx   . Diabetes Neg Hx     Social History Social History   Tobacco Use  . Smoking status: Never Smoker  . Smokeless tobacco: Never Used  Substance Use Topics  . Alcohol use: No  . Drug use: No    Review of Systems  Constitutional: Positive for fever/chills.  Positive for  generalized weakness. Eyes: No visual changes. ENT: No sore throat. Cardiovascular: Denies chest pain. Respiratory: Denies shortness of breath. Gastrointestinal: No abdominal pain.  No nausea, no vomiting.  No diarrhea.  No constipation. Genitourinary: Negative for dysuria. Musculoskeletal: Negative for back pain. Skin: Negative for rash. Neurological: Negative for headaches, focal weakness or numbness.  ____________________________________________   PHYSICAL EXAM:  VITAL SIGNS: ED Triage Vitals  Enc Vitals Group     BP 10/20/19 1625 (!) 120/56     Pulse Rate 10/20/19 1625 68     Resp 10/20/19 1625 16     Temp 10/20/19 1625 (!) 101 F (38.3 C)     Temp Source 10/20/19 1625 Oral     SpO2 10/20/19 1625 92 %     Weight 10/20/19 1638 196 lb (88.9 kg)     Height 10/20/19 1638 5\' 11"  (1.803 m)     Head Circumference --      Peak Flow --      Pain Score 10/20/19 1638 0     Pain Loc --      Pain Edu? --      Excl. in GC? --     Constitutional:  Alert and oriented. Eyes: Conjunctivae are normal. Head: Atraumatic. Nose: No congestion/rhinnorhea. Mouth/Throat: Mucous membranes are moist. Neck: Normal ROM Cardiovascular: Normal rate, regular rhythm. Grossly normal heart sounds. Respiratory: Normal respiratory effort.  No retractions. Lungs CTAB. Gastrointestinal: Soft and nontender. No distention. Genitourinary: deferred Musculoskeletal: No lower extremity tenderness nor edema. Neurologic:  Normal speech and language. No gross focal neurologic deficits are appreciated. Skin:  Skin is warm, dry and intact. No rash noted. Psychiatric: Mood and affect are normal. Speech and behavior are normal.  ____________________________________________   LABS (all labs ordered are listed, but only abnormal results are displayed)  Labs Reviewed  BASIC METABOLIC PANEL - Abnormal; Notable for the following components:      Result Value   Sodium 129 (*)    Chloride 97 (*)    Glucose, Bld  136 (*)    BUN 26 (*)    Creatinine, Ser 1.71 (*)    Calcium 8.2 (*)    GFR calc non Af Amer 35 (*)    GFR calc Af Amer 41 (*)    All other components within normal limits  CBC - Abnormal; Notable for the following components:   WBC 13.5 (*)    Platelets 148 (*)    All other components within normal limits  TROPONIN I (HIGH SENSITIVITY) - Abnormal; Notable for the following components:   Troponin I (High Sensitivity) 20 (*)    All other components within normal limits  TROPONIN I (HIGH SENSITIVITY) - Abnormal; Notable for the following components:   Troponin I (High Sensitivity) 24 (*)    All other components within normal limits  SARS CORONAVIRUS 2 BY RT PCR (HOSPITAL ORDER, PERFORMED IN [place] [facility])  CULTURE, BLOOD (ROUTINE X 2)  CULTURE, BLOOD (ROUTINE X 2)  URINE CULTURE  LACTIC ACID, PLASMA  URINALYSIS, COMPLETE (UACMP) WITH MICROSCOPIC   ____________________________________________  EKG  ED ECG REPORT I, Chesley Noon, the attending physician, personally viewed and interpreted this ECG.   Date: 10/20/2019  EKG Time: 16:21  Rate: 69  Rhythm: Ventricularly paced  Axis: LAD  Intervals:Ventricularly paced  ST&T Change: None   PROCEDURES  Procedure(s) performed (including Critical Care):  .Critical Care Performed by: Chesley Noon, MD Authorized by: Chesley Noon, MD   Critical care provider statement:    Critical care time (minutes):  45   Critical care time was exclusive of:  Separately billable procedures and treating other patients and teaching time   Critical care was necessary to treat or prevent imminent or life-threatening deterioration of the following conditions:  Sepsis   Critical care was time spent personally by me on the following activities:  Discussions with consultants, evaluation of patient's response to treatment, examination of patient, ordering and performing treatments and interventions, ordering and review of laboratory  studies, ordering and review of radiographic studies, pulse oximetry, re-evaluation of patient's condition, obtaining history from patient or surrogate and review of old charts   I assumed direction of critical care for this patient from another provider in my specialty: no       ____________________________________________   INITIAL IMPRESSION / ASSESSMENT AND PLAN / ED COURSE       86 year old male presents to the ED complaining of 2 days of chills, shaking, and generalized weakness, has also noted some diarrhea and a dry cough.  He is overall well-appearing but noted to be febrile with a temp of 101, also with a leukocytosis meeting sepsis criteria.  Symptoms could represent COVID-19 or another viral  illness, however chest x-ray is concerning for a focal pneumonia, which would be more consistent with a bacterial etiology.  We will treat for community-acquired pneumonia with Rocephin and azithromycin, hydrate with IV fluids.  Remainder of lab work is unremarkable, case discussed with hospitalist for admission.      ____________________________________________   FINAL CLINICAL IMPRESSION(S) / ED DIAGNOSES  Final diagnoses:  Sepsis without acute organ dysfunction, due to unspecified organism [facility])  Community acquired pneumonia of left lung, unspecified part of lung     ED Discharge Orders    None       Note:  This document was prepared using Dragon voice recognition software and may include unintentional dictation errors.   Chesley Noon, MD 10/20/19 2052

## 2019-10-20 NOTE — H&P (Signed)
[place] at [facility]   PATIENT NAME: Roy Palmer    MR#:  [id number]  DATE OF BIRTH:  12/06/1929  DATE OF ADMISSION:  10/20/2019  PRIMARY CARE PHYSICIAN: Marjie Skiff, NP   REQUESTING/REFERRING PHYSICIAN: Chesley Noon, MD CHIEF COMPLAINT:   Chief Complaint  Patient presents with  . Weakness    HISTORY OF PRESENT ILLNESS:  Roy Palmer  is a 86 y.o. Caucasian male with a known history of hypertension, dyslipidemia, severe mitral regurgitation status post mitral valve repair right safe, who presented to the emergency room with acute onset of generalized weakness and shaking chills.  He has been having diarrhea over the last couple of days.  No melena or bright red bleeding per rectum.  Denies any significant cough or wheezing or dyspnea.  He admitted to dry mouth.  He was noted to have a fever here.  He stated that he took the second injection of his Pfizer Covid vaccine about a month ago.  He denied any urinary frequency urgency, dysuria or hematuria or flank pain.  Upon presentation to the emergency room, temperature was 101, pulse oximetry was 92 and later 96-98% on room air and vital signs otherwise were within normal.  Labs revealed hyponatremia 129 hypochloremia of 97, BUN of 26 and creatinine 1.7 and high-sensitivity troponin I of 20 and later 24 with lactic acid 1.1.  CBC showed leukocytosis of 13.5 and platelets of 148.  UA showed positive hyaline casts.  Blood cultures and urine cultures were drawn.  Portable chest x-ray showed left perihilar opacity extending to the hilum peripherally and may represent pneumonia with small left pleural effusion and recommendation for follow-up PA and lateral chest x-ray in 3 to 4 weeks.  It showed stable cardiomegaly.  The patient was given IV Rocephin and Zithromax as well as 650 mg p.o. Tylenol and 2 L bolus of IV lactated Ringer.  He will be admitted to a medical monitored bed for further evaluation and management. PAST  MEDICAL HISTORY:   Past Medical History:  Diagnosis Date  . AAA (abdominal aortic aneurysm) without rupture ([facility]) 03/12/2016   Small - measured 3.3 x 3.5 cm by CTA  . CKD (chronic kidney disease) stage 3, GFR 30-59 ml/min   . Essential hypertension   . Hyperlipidemia   . Hypertensive kidney disease   . Incidental pulmonary nodule 03/12/2016   Vague opacity right lung base requires radiographic follow up - index of suspicion is LOW  . Near syncope    Cardiogenic, related to an SVT and severe MR  . Non-sustained ventricular tachycardia ([facility])   . S/P minimally invasive mitral valve repair 03/19/2016   Complex valvuloplasty including triangular resection of flail segment of posterior leaflet, chordal transposition x1, artificial Gore-tex neochord placement x4 and 34 mm Sorin Memo 3D ring annuloplasty via right mini thoracotomy approach with clipping of LA appendage  . Severe mitral regurgitation by prior echocardiogram 02/23/2016   Confirmed by TEE  . Syncope 03/15/2016  . Tennis elbow left  . Thoracic aortic aneurysm ([facility]) 03/12/2016   Very very mild fusiform enlargement of distal transverse and proximal descending thoracic aorta noted on CTA  . Trigeminal neuralgia     PAST SURGICAL HISTORY:   Past Surgical History:  Procedure Laterality Date  . CARDIAC CATHETERIZATION N/A 02/23/2016   Procedure: Right/Left Heart Cath and Coronary Angiography;  Surgeon: [name], MD;  Location: [facility] INVASIVE CV LAB;  Service: Cardiovascular: Angiographic minimal coronary disease. Normal left and right  heart Pressures. Decreased CO/CI in settng of frequent PVCs & Severe MR.  Marland Kitchen CATARACT EXTRACTION, BILATERAL    . COLONOSCOPY  08/2005  . CYST EXCISION  10/2013   from neck  . EP IMPLANTABLE DEVICE N/A 03/27/2016   Procedure: ICD Implant Dual Chamber;  Surgeon: Marinus Maw, MD;  Location: [facility] INVASIVE CV LAB;  Service: Cardiovascular;  Laterality: N/A;  . ESOPHAGOGASTRODUODENOSCOPY  02/2010  .  MITRAL VALVE REPAIR Right 03/19/2016   Procedure: MINIMALLY INVASIVE MITRAL VALVE REPAIR (MVR);  Surgeon: [name], MD;  Location: [facility] OR;  Service: Open Heart Surgery;  Laterality: Right;  . TEE WITHOUT CARDIOVERSION N/A 02/23/2016   Procedure: TRANSESOPHAGEAL ECHOCARDIOGRAM (TEE);  Surgeon: Quintella Reichert, MD;  Location: [facility] ENDOSCOPY;  Service: Cardiovascular: Normal LV size and function.  Degenerative mitral valve disease with failed posterior leaflet (P2 segment), Severe MR with pulmonary vein systolic flow reversal. Moderate-TR.    . TEE WITHOUT CARDIOVERSION N/A 03/19/2016   Procedure: TRANSESOPHAGEAL ECHOCARDIOGRAM (TEE);  Surgeon: [name], MD;  Location: [facility] OR;  Service: Open Heart Surgery;  Laterality: N/A;  . TRANSTHORACIC ECHOCARDIOGRAM  02/14/2016   EF 50-55% with moderate LVH. Possible inferolateral hypokinesis. Moderate-severe MR with posterior leaflet prolapse, cannot rule out flail. Severe LA dilation. Moderate TR.    SOCIAL HISTORY:   Social History   Tobacco Use  . Smoking status: Never Smoker  . Smokeless tobacco: Never Used  Substance Use Topics  . Alcohol use: No    FAMILY HISTORY:   Family History  Problem Relation Age of Onset  . Hypertension Mother   . Stroke Mother   . Heart disease Father   . Heart attack Father   . Dementia Sister   . Retinoblastoma Son   . COPD Neg Hx   . Cancer Neg Hx   . Diabetes Neg Hx     DRUG ALLERGIES:   Allergies  Allergen Reactions  . Amiodarone Other (See Comments)    Terrible dreams/nightmares in high dosing  . Lyrica [Pregabalin] Nausea And Vomiting    REVIEW OF SYSTEMS:   ROS As per history of present illness. All pertinent systems were reviewed above. Constitutional,  HEENT, cardiovascular, respiratory, GI, GU, musculoskeletal, neuro, psychiatric, endocrine,  integumentary and hematologic systems were reviewed and are otherwise  negative/unremarkable except for positive findings mentioned above  in the HPI.   MEDICATIONS AT HOME:   Prior to Admission medications   Medication Sig Start Date End Date Taking? Authorizing Provider  amiodarone (PACERONE) 200 MG tablet TAKE 1 TABLET BY MOUTH EVERY DAY MONDAY THROUGH SATURDAY. DO NOT TAKE ON SUNDAY 12/01/18   End, Cristal Deer, MD  amLODipine (NORVASC) 2.5 MG tablet TAKE 1 TABLET(2.5 MG) BY MOUTH DAILY 08/09/19   Antonieta Iba, MD  aspirin EC 81 MG tablet Take 1 tablet (81 mg total) by mouth daily. 02/09/16   End, Cristal Deer, MD      VITAL SIGNS:  Blood pressure 124/65, pulse 83, temperature (!) 101 F (38.3 C), temperature source Oral, resp. rate 18, height 5\' 11"  (1.803 m), weight 88.9 kg, SpO2 96 %.  PHYSICAL EXAMINATION:  Physical Exam  GENERAL:  86 y.o.-year-old Caucasian male patient lying in the bed with no acute distress.  EYES: Pupils equal, round, reactive to light and accommodation. No scleral icterus. Extraocular muscles intact.  HEENT: Head atraumatic, normocephalic. Oropharynx and nasopharynx clear.  NECK:  Supple, no jugular venous distention. No thyroid enlargement, no tenderness.  LUNGS: Slight diminished left basal and midlung  zone breath sounds. CARDIOVASCULAR: Regular rate and rhythm, S1, S2 normal. No murmurs, rubs, or gallops.  ABDOMEN: Soft, nondistended, nontender. Bowel sounds present. No organomegaly or mass.  EXTREMITIES: No pedal edema, cyanosis, or clubbing.  NEUROLOGIC: Cranial nerves II through XII are intact. Muscle strength 5/5 in all extremities. Sensation intact. Gait not checked.  PSYCHIATRIC: The patient is alert and oriented x 3.  Normal affect and good eye contact. SKIN: No obvious rash, lesion, or ulcer.   LABORATORY PANEL:   CBC Recent Labs  Lab 10/20/19 1634  WBC 13.5*  HGB 13.6  HCT 40.0  PLT 148*   ------------------------------------------------------------------------------------------------------------------  Chemistries  Recent Labs  Lab 10/20/19 1634  NA 129*  K 4.4    CL 97*  CO2 22  GLUCOSE 136*  BUN 26*  CREATININE 1.71*  CALCIUM 8.2*   ------------------------------------------------------------------------------------------------------------------  Cardiac Enzymes No results for input(s): TROPONINI in the last 168 hours. ------------------------------------------------------------------------------------------------------------------  RADIOLOGY:  DG Chest Portable 1 View  Result Date: 10/20/2019 CLINICAL DATA:  Fever. EXAM: PORTABLE CHEST 1 VIEW COMPARISON:  Radiograph 04/24/2017 FINDINGS: Left-sided pacemaker in place. There is left perihilar opacity that extends from the hilum to the pacemaker site. Cardiomegaly with unchanged mediastinal contours. Prosthetic cardiac valve and left atrial clipping. There is a small left pleural effusion. No pulmonary edema. No pneumothorax. Stable osseous structures. IMPRESSION: 1. Left perihilar opacity extending from the hilum peripherally, partially obscured by battery pack from left-sided pacemaker. This may represent pneumonia in the appropriate clinical setting. Small left pleural effusion. Followup PA and lateral chest X-ray is recommended in 3-4 weeks following trial of antibiotic therapy to ensure resolution and exclude underlying malignancy. 2. Stable cardiomegaly. Electronically Signed   By: Narda Rutherford M.D.   On: 10/20/2019 19:36      IMPRESSION AND PLAN:   1.  Suspected left sided community-acquired pneumonia, likely bacterial with associated left pleural effusion likely parapneumonic. -The patient will be admitted to a medically monitored bed. -We will continue antibiotic therapy with IV Rocephin and Zithromax. -We will follow sputum Gram stain culture and sensitivity as well as obtain pneumonia antigens. -O2 protocol will be followed.  2.  Mild sepsis without severe sepsis or septic shock. -This is manifested by leukocytosis and fever and mild tachypnea. -We will continue antibiotic  therapy as mentioned above and follow blood and sputum culture.  3.  Hyponatremia and hypochloremia with associated stage IIIb chronic kidney disease. -The patient will be hydrated with IV normal saline and will follow BMPs.  4.  Hypertension. -We will continue Norvasc.  5. History of nonsustained ventricular tachycardia.   -We will continue his amiodarone.  6.  DVT prophylaxis. -Subtenons Lovenox  All the records are reviewed and case discussed with ED provider. The plan of care was discussed in details with the patient (and family). I answered all questions. The patient agreed to proceed with the above mentioned plan. Further management will depend upon hospital course.   CODE STATUS: Full code  Status is: Inpatient  Remains inpatient appropriate because:Ongoing diagnostic testing needed not appropriate for outpatient work up, Unsafe d/c plan, IV treatments appropriate due to intensity of illness or inability to take PO and Inpatient level of care appropriate due to severity of illness   Dispo: The patient is from: Home              Anticipated d/c is to: Home              Anticipated d/c date is: 2  days              Patient currently is not medically stable to d/c.    TOTAL TIME TAKING CARE OF THIS PATIENT:   minutes.    Christel Mormon M.D on 10/20/2019 at 10:17 PM  [facility]   From 7 PM-7 AM, contact night-coverage [email or web address]  CC: Primary care physician; Venita Lick, NP   Note: This dictation was prepared with Dragon dictation along with smaller phrase technology. Any transcriptional errors that result from this process are unintentional.

## 2019-10-20 NOTE — ED Notes (Signed)
Pt transferred over to bed with wobbly gait. Pt A&Ox4 and in NAD. Pt reports diarrhea Monday and Tuesday but that has subsided. Reports feeling chilled at times.

## 2019-10-20 NOTE — ED Notes (Signed)
First nurse note. [facility] staff brought pt over for shaky and unsteady gait. No blood suagr check or vitals other than temp of 101. Pt is 94% on room air.

## 2019-10-21 ENCOUNTER — Encounter: Payer: Self-pay | Admitting: Family Medicine

## 2019-10-21 ENCOUNTER — Other Ambulatory Visit: Payer: Self-pay

## 2019-10-21 DIAGNOSIS — N1832 Chronic kidney disease, stage 3b: Secondary | ICD-10-CM

## 2019-10-21 LAB — CBC
HCT: 37.6 % — ABNORMAL LOW (ref 39.0–52.0)
Hemoglobin: 12.6 g/dL — ABNORMAL LOW (ref 13.0–17.0)
MCH: 31.9 pg (ref 26.0–34.0)
MCHC: 33.5 g/dL (ref 30.0–36.0)
MCV: 95.2 fL (ref 80.0–100.0)
Platelets: 140 10*3/uL — ABNORMAL LOW (ref 150–400)
RBC: 3.95 MIL/uL — ABNORMAL LOW (ref 4.22–5.81)
RDW: 14.1 % (ref 11.5–15.5)
WBC: 12.8 10*3/uL — ABNORMAL HIGH (ref 4.0–10.5)
nRBC: 0 % (ref 0.0–0.2)

## 2019-10-21 LAB — BASIC METABOLIC PANEL
Anion gap: 8 (ref 5–15)
BUN: 24 mg/dL — ABNORMAL HIGH (ref 8–23)
CO2: 24 mmol/L (ref 22–32)
Calcium: 7.9 mg/dL — ABNORMAL LOW (ref 8.9–10.3)
Chloride: 101 mmol/L (ref 98–111)
Creatinine, Ser: 1.44 mg/dL — ABNORMAL HIGH (ref 0.61–1.24)
GFR calc Af Amer: 50 mL/min — ABNORMAL LOW (ref >60)
GFR calc non Af Amer: 43 mL/min — ABNORMAL LOW (ref >60)
Glucose, Bld: 120 mg/dL — ABNORMAL HIGH (ref 70–99)
Potassium: 4.4 mmol/L (ref 3.5–5.1)
Sodium: 133 mmol/L — ABNORMAL LOW (ref 135–145)

## 2019-10-21 LAB — STREP PNEUMONIAE URINARY ANTIGEN: Strep Pneumo Urinary Antigen: NEGATIVE

## 2019-10-21 LAB — HIV ANTIBODY (ROUTINE TESTING W REFLEX): HIV Screen 4th Generation wRfx: NONREACTIVE

## 2019-10-21 NOTE — Progress Notes (Signed)
PROGRESS NOTE    Roy Palmer  RXY:[id number] DOB: February 20, 1930 DOA: 10/20/2019 PCP: Venita Lick, NP   Chief complaint.  High fever and cough.  Brief Narrative: Roy Palmer  is a 86 y.o. Caucasian male with a known history of hypertension, dyslipidemia, severe mitral regurgitation status post mitral valve repair right safe, who presented to the emergency room with acute onset of generalized weakness and shaking chills.  He has been having diarrhea over the last couple of days. He started having fever after arrival in the emergency room.  Chest x-ray showed a left perihilar opacity.  He is diagnosed with community-acquired pneumonia, was started on Rocephin and doxycycline   Assessment & Plan:   Active Problems:   Sepsis ([place])  #1.  Left-sided community-acquired pneumonia. I personally reviewed patient chest x-ray images, infiltrates in the left lung field appear to be segmental attribution.  Possible bacterial pneumonia, continue antibiotics for now waiting culture results.  Patient will need a repeat chest x-ray in a few weeks to make sure its resolution.  There is a possibility of airway obstruction.  #2.  Sepsis. No additional fever since admission.  Discontinue fluids.  3.  Hyponatremia. Sodium is better after fluids.  4.  Chronic kidney disease stage IIIb. Continue to follow.  5.  Essential hypertension. Continue home medicine.  6.  Mild thrombocytopenia. Follow, repeat a CBC tomorrow.  Most likely due to sepsis.   DVT prophylaxis: Lovenox Code Status: Full Family Communication:None Disposition Plan:  . Patient came from: Home           . Anticipated d/c place:Home . Barriers to d/c OR conditions which need to be met to effect a safe d/c: None  Consultants:   None  Procedures: None Antimicrobials:  Rocephin Doxycycline.  Subjective: Patient has chills and high fever last night with temperature as high as 103.2. He started have a cough today, but  nonproductive.  He has some tachypnea, but denies any shortness of breath. No abdominal pain or nausea vomiting. No diarrhea constipation No dysuria hematuria.  Objective: Vitals:   10/21/19 0000 10/21/19 0030 10/21/19 0130 10/21/19 0156  BP: 137/74 (!) 146/73  121/62  Pulse: 74 92  85  Resp: '16 20  19  '$ Temp:   (!) 103.2 F (39.6 C) 99.6 F (37.6 C)  TempSrc:   Oral Oral  SpO2: 94% 94%  92%  Weight:    87 kg  Height:    '5\' 11"'$  (1.803 m)    Intake/Output Summary (Last 24 hours) at 10/21/2019 1010 Last data filed at 10/21/2019 0917 Gross per 24 hour  Intake 2350 ml  Output 925 ml  Net 1425 ml   Filed Weights   10/20/19 1638 10/21/19 0156  Weight: 88.9 kg 87 kg    Examination:  General exam: Appears calm and comfortable  Respiratory system: Coarse breathing sounds.Marland Kitchen Respiratory effort normal. Cardiovascular system: S1 & S2 heard, RRR. No JVD, murmurs, rubs, gallops or clicks. No pedal edema. Gastrointestinal system: Abdomen is nondistended, soft and nontender. No organomegaly or masses felt. Normal bowel sounds heard. Central nervous system: Alert and oriented x3. No focal neurological deficits. Extremities: Symmetric 5 x 5 power. Skin: No rashes, lesions or ulcers Psychiatry: Judgement and insight appear normal. Mood & affect appropriate.     Data Reviewed: I have personally reviewed following labs and imaging studies  CBC: Recent Labs  Lab 10/20/19 1634 10/21/19 0555  WBC 13.5* 12.8*  HGB 13.6 12.6*  HCT 40.0 37.6*  MCV 94.1 95.2  PLT 148* 035*   Basic Metabolic Panel: Recent Labs  Lab 10/20/19 1634 10/21/19 0555  NA 129* 133*  K 4.4 4.4  CL 97* 101  CO2 22 24  GLUCOSE 136* 120*  BUN 26* 24*  CREATININE 1.71* 1.44*  CALCIUM 8.2* 7.9*   GFR: Estimated Creatinine Clearance: 38.5 mL/min (A) (by C-G formula based on SCr of 1.44 mg/dL (H)). Liver Function Tests: No results for input(s): AST, ALT, ALKPHOS, BILITOT, PROT, ALBUMIN in the last 168  hours. No results for input(s): LIPASE, AMYLASE in the last 168 hours. No results for input(s): AMMONIA in the last 168 hours. Coagulation Profile: No results for input(s): INR, PROTIME in the last 168 hours. Cardiac Enzymes: No results for input(s): CKTOTAL, CKMB, CKMBINDEX, TROPONINI in the last 168 hours. BNP (last 3 results) No results for input(s): PROBNP in the last 8760 hours. HbA1C: No results for input(s): HGBA1C in the last 72 hours. CBG: No results for input(s): GLUCAP in the last 168 hours. Lipid Profile: No results for input(s): CHOL, HDL, LDLCALC, TRIG, CHOLHDL, LDLDIRECT in the last 72 hours. Thyroid Function Tests: No results for input(s): TSH, T4TOTAL, FREET4, T3FREE, THYROIDAB in the last 72 hours. Anemia Panel: No results for input(s): VITAMINB12, FOLATE, FERRITIN, TIBC, IRON, RETICCTPCT in the last 72 hours. Sepsis Labs: Recent Labs  Lab 10/20/19 2003  LATICACIDVEN 1.1    Recent Results (from the past 240 hour(s))  SARS Coronavirus 2 by RT PCR (hospital order, performed in [facility] hospital lab) Nasopharyngeal Nasopharyngeal Swab     Status: None   Collection Time: 10/20/19  8:02 PM   Specimen: Nasopharyngeal Swab  Result Value Ref Range Status   SARS Coronavirus 2 NEGATIVE NEGATIVE Final    Comment: (NOTE) SARS-CoV-2 target nucleic acids are NOT DETECTED. The SARS-CoV-2 RNA is generally detectable in upper and lower respiratory specimens during the acute phase of infection. The lowest concentration of SARS-CoV-2 viral copies this assay can detect is 250 copies / mL. A negative result does not preclude SARS-CoV-2 infection and should not be used as the sole basis for treatment or other patient management decisions.  A negative result may occur with improper specimen collection / handling, submission of specimen other than nasopharyngeal swab, presence of viral mutation(s) within the areas targeted by this assay, and inadequate number of viral  copies (<250 copies / mL). A negative result must be combined with clinical observations, patient history, and epidemiological information. Fact Sheet for Patients:   StrictlyIdeas.no Fact Sheet for Healthcare Providers: BankingDealers.co.za This test is not yet approved or cleared  by the [place] FDA and has been authorized for detection and/or diagnosis of SARS-CoV-2 by FDA under an Emergency Use Authorization (EUA).  This EUA will remain in effect (meaning this test can be used) for the duration of the COVID-19 declaration under Section 564(b)(1) of the Act, 21 U.S.C. section 360bbb-3(b)(1), unless the authorization is terminated or revoked sooner. Performed at [facility], [address] [place], [place] [address]   Culture, blood (routine x 2)     Status: None (Preliminary result)   Collection Time: 10/20/19  8:03 PM   Specimen: BLOOD  Result Value Ref Range Status   Specimen Description BLOOD BLOOD RIGHT FOREARM  Final   Special Requests   Final    BOTTLES DRAWN AEROBIC AND ANAEROBIC Blood Culture adequate volume   Culture   Final    NO GROWTH < 12 HOURS Performed at [facility], [address] [place], [place] [address]    Report Status PENDING  Incomplete  Culture, blood (routine x 2)     Status: None (Preliminary result)   Collection Time: 10/20/19  8:03 PM   Specimen: BLOOD  Result Value Ref Range Status   Specimen Description BLOOD RIGHT ANTECUBITAL  Final   Special Requests   Final    BOTTLES DRAWN AEROBIC AND ANAEROBIC Blood Culture adequate volume   Culture   Final    NO GROWTH < 12 HOURS Performed at [facility], [address] [place], [place] [address]    Report Status PENDING  Incomplete         Radiology Studies: DG Chest Portable 1 View  Result Date: 10/20/2019 CLINICAL DATA:  Fever. EXAM: PORTABLE CHEST 1 VIEW COMPARISON:  Radiograph 04/24/2017 FINDINGS:  Left-sided pacemaker in place. There is left perihilar opacity that extends from the hilum to the pacemaker site. Cardiomegaly with unchanged mediastinal contours. Prosthetic cardiac valve and left atrial clipping. There is a small left pleural effusion. No pulmonary edema. No pneumothorax. Stable osseous structures. IMPRESSION: 1. Left perihilar opacity extending from the hilum peripherally, partially obscured by battery pack from left-sided pacemaker. This may represent pneumonia in the appropriate clinical setting. Small left pleural effusion. Followup PA and lateral chest X-ray is recommended in 3-4 weeks following trial of antibiotic therapy to ensure resolution and exclude underlying malignancy. 2. Stable cardiomegaly. Electronically Signed   By: Keith Rake M.D.   On: 10/20/2019 19:36        Scheduled Meds: . amiodarone  200 mg Oral Once per day on Mon Tue Wed Thu Fri Sat  . amLODipine  2.5 mg Oral Daily  . aspirin EC  81 mg Oral Daily  . enoxaparin (LOVENOX) injection  40 mg Subcutaneous Q24H  . guaiFENesin  600 mg Oral BID  . sodium chloride flush  3 mL Intravenous Once   Continuous Infusions: . cefTRIAXone (ROCEPHIN)  IV    . doxycycline (VIBRAMYCIN) IV 100 mg (10/21/19 0944)     LOS: 1 day    Time spent: 35 minutes    Sharen Hones, MD [facility]   To contact the attending provider between 7A-7P or the covering provider during after hours 7P-7A, please log into the web site [email or web address] and access using universal [place] password for that web site. If you do not have the password, please call the hospital operator.  10/21/2019, 10:10 AM

## 2019-10-21 NOTE — Plan of Care (Signed)
  Problem: Activity: Goal: Ability to tolerate increased activity will improve Outcome: Progressing   Problem: Clinical Measurements: Goal: Ability to maintain a body temperature in the normal range will improve Outcome: Progressing   Problem: Respiratory: Goal: Ability to maintain adequate ventilation will improve Outcome: Progressing Goal: Ability to maintain a clear airway will improve Outcome: Progressing   

## 2019-10-22 ENCOUNTER — Inpatient Hospital Stay: Payer: Medicare Other

## 2019-10-22 DIAGNOSIS — J181 Lobar pneumonia, unspecified organism: Secondary | ICD-10-CM

## 2019-10-22 LAB — CBC WITH DIFFERENTIAL/PLATELET
Abs Immature Granulocytes: 0.05 10*3/uL (ref 0.00–0.07)
Basophils Absolute: 0 10*3/uL (ref 0.0–0.1)
Basophils Relative: 0 %
Eosinophils Absolute: 0 10*3/uL (ref 0.0–0.5)
Eosinophils Relative: 0 %
HCT: 37 % — ABNORMAL LOW (ref 39.0–52.0)
Hemoglobin: 12.3 g/dL — ABNORMAL LOW (ref 13.0–17.0)
Immature Granulocytes: 0 %
Lymphocytes Relative: 8 %
Lymphs Abs: 1 10*3/uL (ref 0.7–4.0)
MCH: 31.9 pg (ref 26.0–34.0)
MCHC: 33.2 g/dL (ref 30.0–36.0)
MCV: 96.1 fL (ref 80.0–100.0)
Monocytes Absolute: 1.1 10*3/uL — ABNORMAL HIGH (ref 0.1–1.0)
Monocytes Relative: 8 %
Neutro Abs: 10.8 10*3/uL — ABNORMAL HIGH (ref 1.7–7.7)
Neutrophils Relative %: 84 %
Platelets: 142 10*3/uL — ABNORMAL LOW (ref 150–400)
RBC: 3.85 MIL/uL — ABNORMAL LOW (ref 4.22–5.81)
RDW: 14.4 % (ref 11.5–15.5)
WBC: 13 10*3/uL — ABNORMAL HIGH (ref 4.0–10.5)
nRBC: 0 % (ref 0.0–0.2)

## 2019-10-22 LAB — BASIC METABOLIC PANEL
Anion gap: 9 (ref 5–15)
BUN: 23 mg/dL (ref 8–23)
CO2: 23 mmol/L (ref 22–32)
Calcium: 7.9 mg/dL — ABNORMAL LOW (ref 8.9–10.3)
Chloride: 102 mmol/L (ref 98–111)
Creatinine, Ser: 1.63 mg/dL — ABNORMAL HIGH (ref 0.61–1.24)
GFR calc Af Amer: 43 mL/min — ABNORMAL LOW (ref >60)
GFR calc non Af Amer: 37 mL/min — ABNORMAL LOW (ref >60)
Glucose, Bld: 97 mg/dL (ref 70–99)
Potassium: 4.2 mmol/L (ref 3.5–5.1)
Sodium: 134 mmol/L — ABNORMAL LOW (ref 135–145)

## 2019-10-22 LAB — LEGIONELLA PNEUMOPHILA SEROGP 1 UR AG: L. pneumophila Serogp 1 Ur Ag: NEGATIVE

## 2019-10-22 LAB — MAGNESIUM: Magnesium: 2.2 mg/dL (ref 1.7–2.4)

## 2019-10-22 MED ORDER — LEVOFLOXACIN IN D5W 500 MG/100ML IV SOLN
500.0000 mg | INTRAVENOUS | Status: DC
  Administered 2019-10-22: 13:00:00 500 mg via INTRAVENOUS
  Filled 2019-10-22 (×2): qty 100

## 2019-10-22 MED ORDER — IPRATROPIUM-ALBUTEROL 0.5-2.5 (3) MG/3ML IN SOLN
3.0000 mL | RESPIRATORY_TRACT | Status: DC | PRN
  Administered 2019-10-22 – 2019-10-23 (×3): 3 mL via RESPIRATORY_TRACT
  Filled 2019-10-22 (×3): qty 3

## 2019-10-22 MED ORDER — LEVOFLOXACIN IN D5W 250 MG/50ML IV SOLN
250.0000 mg | INTRAVENOUS | Status: DC
  Administered 2019-10-23: 250 mg via INTRAVENOUS
  Filled 2019-10-22 (×2): qty 50

## 2019-10-22 NOTE — Care Management Important Message (Signed)
Important Message  Patient Details  Name: Roy Palmer MRN: [id number] Date of Birth: 1930-01-13   Medicare Important Message Given:  N/A - LOS <3 / Initial given by admissions     Olegario Messier A Remus Hagedorn 10/22/2019, 8:21 AM

## 2019-10-22 NOTE — Progress Notes (Signed)
PHARMACY NOTE:  ANTIMICROBIAL RENAL DOSAGE ADJUSTMENT  Current antimicrobial regimen includes a mismatch between antimicrobial dosage and estimated renal function.  As per policy approved by the Pharmacy & Therapeutics and Medical Executive Committees, the antimicrobial dosage will be adjusted accordingly.  Current antimicrobial dosage:  Levofloxacin 500mg  Iv every 24 hours   Indication: CAP  Renal Function: Estimated Creatinine Clearance: 34 mL/min (A) (by C-G formula based on SCr of 1.63 mg/dL (H)).     Antimicrobial dosage has been changed to:  Levofloxacin 250mg  IV every 24 hours    Thank you for allowing pharmacy to be a part of this patient's care.  , PharmD, BCPS Clinical Pharmacist 10/22/2019 12:52 PM

## 2019-10-22 NOTE — Evaluation (Signed)
Physical Therapy Evaluation Patient Details Name: Roy Palmer MRN: [id number] DOB: 1929/09/20 Today's Date: 10/22/2019   History of Present Illness  Pt admitted for sepsis and CAP. History includes HTN, severe mitral regurgitation s/p mitral valve repair, and AAA. Complains of weakness and chills.   Clinical Impression  Pt is a pleasant 86 year old male who was admitted for sepsis and CAP. Pt performs bed mobility with mod I, transfers with cga, and ambulation with cga and RW. Pt demonstrates deficits with balance/strength. Pt is close to baseline level. Would benefit from skilled PT to address above deficits and promote optimal return to PLOF. Recommend transition to [facility] upon discharge from acute hospitalization.     Follow Up Recommendations Home health PT    Equipment Recommendations  None recommended by PT    Recommendations for Other Services       Precautions / Restrictions Precautions Precautions: Fall Restrictions Weight Bearing Restrictions: No      Mobility  Bed Mobility Overal bed mobility: Modified Independent             General bed mobility comments: safe technique with ease of transition  Transfers Overall transfer level: Needs assistance Equipment used: Rolling walker (2 wheeled) Transfers: Sit to/from Stand Sit to Stand: Min guard         General transfer comment: safe technique with upright posture. RW used  Ambulation/Gait Ambulation/Gait assistance: Min Conservator, museum/gallery (Feet): 200 Feet Assistive device: Rolling walker (2 wheeled) Gait Pattern/deviations: Step-through pattern     General Gait Details: safe technique with good reciprocal gait pattern. No fatigue noted with ambulation. Slight safety concerns with cues needed to keep close to RW'  Stairs            Wheelchair Mobility    Modified Rankin (Stroke Patients Only)       Balance Overall balance assessment: Mild deficits observed, not formally tested                                            Pertinent Vitals/Pain Pain Assessment: No/denies pain    Home Living Family/patient expects to be discharged to:: Private residence Living Arrangements: Spouse/significant other Available Help at Discharge: Family Type of Home: House Home Access: Stairs to enter Entrance Stairs-Rails: Right Entrance Stairs-Number of Steps: 3 Home Layout: Able to live on main level with bedroom/bathroom(whole flight of stairs to basement) Home Equipment: Walker - 2 wheels;Bedside commode;Walker - 4 wheels      Prior Function Level of Independence: Independent         Comments: reports no falls recently however decreased balance noted     Hand Dominance        Extremity/Trunk Assessment   Upper Extremity Assessment Upper Extremity Assessment: Overall WFL for tasks assessed    Lower Extremity Assessment Lower Extremity Assessment: Overall WFL for tasks assessed       Communication   Communication: No difficulties  Cognition Arousal/Alertness: Awake/alert Behavior During Therapy: WFL for tasks assessed/performed Overall Cognitive Status: Within Functional Limits for tasks assessed                                        General Comments      Exercises Other Exercises Other Exercises: ambulated in hallway with good speed. Has  slight difficulty with turns and needs cues to stay within RW   Assessment/Plan    PT Assessment Patient needs continued PT services  PT Problem List Decreased strength;Decreased balance;Decreased mobility;Decreased knowledge of use of DME       PT Treatment Interventions Gait training;Therapeutic exercise;Balance training;DME instruction    PT Goals (Current goals can be found in the Care Plan section)  Acute Rehab PT Goals Patient Stated Goal: to go home PT Goal Formulation: With patient Time For Goal Achievement: 11/05/19 Potential to Achieve Goals: Good    Frequency Min  2X/week   Barriers to discharge        Co-evaluation               AM-PAC PT "6 Clicks" Mobility  Outcome Measure Help needed turning from your back to your side while in a flat bed without using bedrails?: None Help needed moving from lying on your back to sitting on the side of a flat bed without using bedrails?: None Help needed moving to and from a bed to a chair (including a wheelchair)?: None Help needed standing up from a chair using your arms (e.g., wheelchair or bedside chair)?: A Little Help needed to walk in hospital room?: A Little Help needed climbing 3-5 steps with a railing? : A Little 6 Click Score: 21    End of Session Equipment Utilized During Treatment: Gait belt Activity Tolerance: Patient tolerated treatment well Patient left: in chair;with chair alarm set Nurse Communication: Mobility status PT Visit Diagnosis: Muscle weakness (generalized) (M62.81);Difficulty in walking, not elsewhere classified (R26.2);Unsteadiness on feet (R26.81)    Time: 2130-8657 PT Time Calculation (min) (ACUTE ONLY): 32 min   Charges:   PT Evaluation $PT Eval Low Complexity: 1 Low PT Treatments $Gait Training: 8-22 mins        [name], PT, DPT [phone]   [name] 10/22/2019, 4:44 PM

## 2019-10-22 NOTE — Progress Notes (Signed)
PROGRESS NOTE    Roy Palmer  NLZ:[id number] DOB: [birth year] DOA: 10/20/2019 PCP: Venita Lick, NP   Brief Narrative:  JohnReynoldsis a89 y.o.Caucasian malewith a known history of hypertension, dyslipidemia, severe mitral regurgitation status post mitral valve repair right safe, who presented to the emergency room with acute onset of generalized weakness and shaking chills. He has been having diarrhea over the last couple of days. He started having fever after arrival in the emergency room.  Chest x-ray showed a left perihilar opacity.  He is diagnosed with community-acquired pneumonia, was started on Rocephin and doxycycline.  5/21.  Patient still has a high fever.  Antibiotics switched to Levaquin.   Assessment & Plan:   Active Problems:   Sepsis ([place])  #1.  Left-sided community-acquired pneumonia with sepsis. Patient is not responding to treatment with Rocephin and doxycycline.  Still has a fever.  With concurrent hyponatremia, will consider possibility of Legionella.  Urine antigens pending.  Will change antibiotics to Levaquin.  With a segmental distribution of pneumonia, will obtain CT scan to rule out airway obstruction.  Also check procalcitonin level tomorrow to guide treatment.  2.  Hyponatremia. Sodium level gradually improving.  3.  Chronic kidney disease stage IIIb. Continue to follow.  4.  Essential hypertension.   Continue home medicines.  5.  Mild thrombocytopenia. Secondary to sepsis.  Stable.   DVT prophylaxis: Lovenox Code Status: Full Family Communication:None Disposition Plan:   Patient came from:      Home                                                                                                           Anticipated d/c place:Home  Barriers to d/c OR conditions which need to be met to effect a safe d/c: None  Consultants:   None  Procedures: None Antimicrobials:  Levaquin Doxycycline.    Subjective: Patient still  had a high fever last night.  Still has cough, nonproductive.  Denies any short of breath. No abdominal pain no nausea vomiting.  No diarrhea constipation. No dysuria or hematuria.  Objective: Vitals:   10/22/19 0016 10/22/19 0048 10/22/19 0140 10/22/19 0142  BP: 124/64  116/67   Pulse: 75  71 84  Resp: (!) 24  (!) 21 19  Temp: (!) 102 F (38.9 C)  98.2 F (36.8 C)   TempSrc: Oral  Oral   SpO2: 93% 92% 90% 91%  Weight:      Height:        Intake/Output Summary (Last 24 hours) at 10/22/2019 0925 Last data filed at 10/22/2019 0617 Gross per 24 hour  Intake --  Output 600 ml  Net -600 ml   Filed Weights   10/20/19 1638 10/21/19 0156  Weight: 88.9 kg 87 kg    Examination:  General exam: Appears calm and comfortable  Respiratory system: Clear to auscultation. Respiratory effort normal. Cardiovascular system: S1 & S2 heard, RRR. No JVD, murmurs, rubs, gallops or clicks. No pedal edema. Gastrointestinal system: Abdomen is nondistended, soft and nontender. No organomegaly or masses  felt. Normal bowel sounds heard. Central nervous system: Alert and oriented. No focal neurological deficits. Extremities: Symmetric 5 x 5 power. Skin: No rashes, lesions or ulcers Psychiatry: Judgement and insight appear normal. Mood & affect appropriate.     Data Reviewed: I have personally reviewed following labs and imaging studies  CBC: Recent Labs  Lab 10/20/19 1634 10/21/19 0555 10/22/19 0611  WBC 13.5* 12.8* 13.0*  NEUTROABS  --   --  10.8*  HGB 13.6 12.6* 12.3*  HCT 40.0 37.6* 37.0*  MCV 94.1 95.2 96.1  PLT 148* 140* 518*   Basic Metabolic Panel: Recent Labs  Lab 10/20/19 1634 10/21/19 0555 10/22/19 0611  NA 129* 133* 134*  K 4.4 4.4 4.2  CL 97* 101 102  CO2 '22 24 23  '$ GLUCOSE 136* 120* 97  BUN 26* 24* 23  CREATININE 1.71* 1.44* 1.63*  CALCIUM 8.2* 7.9* 7.9*  MG  --   --  2.2   GFR: Estimated Creatinine Clearance: 34 mL/min (A) (by C-G formula based on SCr of 1.63  mg/dL (H)). Liver Function Tests: No results for input(s): AST, ALT, ALKPHOS, BILITOT, PROT, ALBUMIN in the last 168 hours. No results for input(s): LIPASE, AMYLASE in the last 168 hours. No results for input(s): AMMONIA in the last 168 hours. Coagulation Profile: No results for input(s): INR, PROTIME in the last 168 hours. Cardiac Enzymes: No results for input(s): CKTOTAL, CKMB, CKMBINDEX, TROPONINI in the last 168 hours. BNP (last 3 results) No results for input(s): PROBNP in the last 8760 hours. HbA1C: No results for input(s): HGBA1C in the last 72 hours. CBG: No results for input(s): GLUCAP in the last 168 hours. Lipid Profile: No results for input(s): CHOL, HDL, LDLCALC, TRIG, CHOLHDL, LDLDIRECT in the last 72 hours. Thyroid Function Tests: No results for input(s): TSH, T4TOTAL, FREET4, T3FREE, THYROIDAB in the last 72 hours. Anemia Panel: No results for input(s): VITAMINB12, FOLATE, FERRITIN, TIBC, IRON, RETICCTPCT in the last 72 hours. Sepsis Labs: Recent Labs  Lab 10/20/19 2003  LATICACIDVEN 1.1    Recent Results (from the past 240 hour(s))  SARS Coronavirus 2 by RT PCR (hospital order, performed in [facility] hospital lab) Nasopharyngeal Nasopharyngeal Swab     Status: None   Collection Time: 10/20/19  8:02 PM   Specimen: Nasopharyngeal Swab  Result Value Ref Range Status   SARS Coronavirus 2 NEGATIVE NEGATIVE Final    Comment: (NOTE) SARS-CoV-2 target nucleic acids are NOT DETECTED. The SARS-CoV-2 RNA is generally detectable in upper and lower respiratory specimens during the acute phase of infection. The lowest concentration of SARS-CoV-2 viral copies this assay can detect is 250 copies / mL. A negative result does not preclude SARS-CoV-2 infection and should not be used as the sole basis for treatment or other patient management decisions.  A negative result may occur with improper specimen collection / handling, submission of specimen other than nasopharyngeal  swab, presence of viral mutation(s) within the areas targeted by this assay, and inadequate number of viral copies (<250 copies / mL). A negative result must be combined with clinical observations, patient history, and epidemiological information. Fact Sheet for Patients:   StrictlyIdeas.no Fact Sheet for Healthcare Providers: BankingDealers.co.za This test is not yet approved or cleared  by the [place] FDA and has been authorized for detection and/or diagnosis of SARS-CoV-2 by FDA under an Emergency Use Authorization (EUA).  This EUA will remain in effect (meaning this test can be used) for the duration of the COVID-19 declaration under Section  564(b)(1) of the Act, 21 U.S.C. section 360bbb-3(b)(1), unless the authorization is terminated or revoked sooner. Performed at [facility], [address], [place], [place] [address]   Urine culture     Status: None (Preliminary result)   Collection Time: 10/20/19  8:02 PM   Specimen: In/Out Cath Urine  Result Value Ref Range Status   Specimen Description   Final    IN/OUT CATH URINE Performed at [facility], [address], [place], [place] [address]    Special Requests   Final    NONE Performed at [facility], [address], [place], [place] [address]    Culture   Final    CULTURE REINCUBATED FOR BETTER GROWTH Performed at [facility], [place] [address], [place], [place] [address]    Report Status PENDING  Incomplete  Culture, blood (routine x 2)     Status: None (Preliminary result)   Collection Time: 10/20/19  8:03 PM   Specimen: BLOOD  Result Value Ref Range Status   Specimen Description BLOOD BLOOD RIGHT FOREARM  Final   Special Requests   Final    BOTTLES DRAWN AEROBIC AND ANAEROBIC Blood Culture adequate volume   Culture   Final    NO GROWTH 2 DAYS Performed at [facility], [address] [place], [place] [address]     Report Status PENDING  Incomplete  Culture, blood (routine x 2)     Status: None (Preliminary result)   Collection Time: 10/20/19  8:03 PM   Specimen: BLOOD  Result Value Ref Range Status   Specimen Description BLOOD RIGHT ANTECUBITAL  Final   Special Requests   Final    BOTTLES DRAWN AEROBIC AND ANAEROBIC Blood Culture adequate volume   Culture   Final    NO GROWTH 2 DAYS Performed at [facility], [address], [place], [place] [address]    Report Status PENDING  Incomplete         Radiology Studies: DG Chest Portable 1 View  Result Date: 10/20/2019 CLINICAL DATA:  Fever. EXAM: PORTABLE CHEST 1 VIEW COMPARISON:  Radiograph 04/24/2017 FINDINGS: Left-sided pacemaker in place. There is left perihilar opacity that extends from the hilum to the pacemaker site. Cardiomegaly with unchanged mediastinal contours. Prosthetic cardiac valve and left atrial clipping. There is a small left pleural effusion. No pulmonary edema. No pneumothorax. Stable osseous structures. IMPRESSION: 1. Left perihilar opacity extending from the hilum peripherally, partially obscured by battery pack from left-sided pacemaker. This may represent pneumonia in the appropriate clinical setting. Small left pleural effusion. Followup PA and lateral chest X-ray is recommended in 3-4 weeks following trial of antibiotic therapy to ensure resolution and exclude underlying malignancy. 2. Stable cardiomegaly. Electronically Signed   By: Keith Rake M.D.   On: 10/20/2019 19:36        Scheduled Meds: . amiodarone  200 mg Oral Once per day on Mon Tue Wed Thu Fri Sat  . amLODipine  2.5 mg Oral Daily  . aspirin EC  81 mg Oral Daily  . enoxaparin (LOVENOX) injection  40 mg Subcutaneous Q24H  . guaiFENesin  600 mg Oral BID  . sodium chloride flush  3 mL Intravenous Once   Continuous Infusions: . doxycycline (VIBRAMYCIN) IV 100 mg (10/21/19 2202)  . levofloxacin (LEVAQUIN) IV       LOS: 2 days    Time  spent: 35 minutes    Sharen Hones, MD [facility]   To contact the attending  provider between 7A-7P or the covering provider during after hours 7P-7A, please log into the web site [email or web address] and access using universal  password for that web site. If you do not have the password, please call the hospital operator.  10/22/2019, 9:25 AM

## 2019-10-23 LAB — BASIC METABOLIC PANEL
Anion gap: 7 (ref 5–15)
Anion gap: 10 (ref 5–15)
BUN: 22 mg/dL (ref 8–23)
BUN: 22 mg/dL (ref 8–23)
CO2: 25 mmol/L (ref 22–32)
CO2: 24 mmol/L (ref 22–32)
Calcium: 7.8 mg/dL — ABNORMAL LOW (ref 8.9–10.3)
Calcium: 7.8 mg/dL — ABNORMAL LOW (ref 8.9–10.3)
Chloride: 102 mmol/L (ref 98–111)
Chloride: 98 mmol/L (ref 98–111)
Creatinine, Ser: 1.49 mg/dL — ABNORMAL HIGH (ref 0.61–1.24)
Creatinine, Ser: 1.56 mg/dL — ABNORMAL HIGH (ref 0.61–1.24)
GFR calc Af Amer: 48 mL/min — ABNORMAL LOW (ref >60)
GFR calc Af Amer: 46 mL/min — ABNORMAL LOW (ref >60)
GFR calc non Af Amer: 42 mL/min — ABNORMAL LOW (ref >60)
GFR calc non Af Amer: 39 mL/min — ABNORMAL LOW (ref >60)
Glucose, Bld: 117 mg/dL — ABNORMAL HIGH (ref 70–99)
Glucose, Bld: 108 mg/dL — ABNORMAL HIGH (ref 70–99)
Potassium: 4 mmol/L (ref 3.5–5.1)
Potassium: 3.8 mmol/L (ref 3.5–5.1)
Sodium: 134 mmol/L — ABNORMAL LOW (ref 135–145)
Sodium: 132 mmol/L — ABNORMAL LOW (ref 135–145)

## 2019-10-23 LAB — CBC WITH DIFFERENTIAL/PLATELET
Abs Immature Granulocytes: 0.06 10*3/uL (ref 0.00–0.07)
Basophils Absolute: 0 10*3/uL (ref 0.0–0.1)
Basophils Relative: 0 %
Eosinophils Absolute: 0 10*3/uL (ref 0.0–0.5)
Eosinophils Relative: 0 %
HCT: 34.6 % — ABNORMAL LOW (ref 39.0–52.0)
Hemoglobin: 11.7 g/dL — ABNORMAL LOW (ref 13.0–17.0)
Immature Granulocytes: 1 %
Lymphocytes Relative: 7 %
Lymphs Abs: 0.9 10*3/uL (ref 0.7–4.0)
MCH: 31.3 pg (ref 26.0–34.0)
MCHC: 33.8 g/dL (ref 30.0–36.0)
MCV: 92.5 fL (ref 80.0–100.0)
Monocytes Absolute: 0.9 10*3/uL (ref 0.1–1.0)
Monocytes Relative: 7 %
Neutro Abs: 10.9 10*3/uL — ABNORMAL HIGH (ref 1.7–7.7)
Neutrophils Relative %: 85 %
Platelets: 166 10*3/uL (ref 150–400)
RBC: 3.74 MIL/uL — ABNORMAL LOW (ref 4.22–5.81)
RDW: 14.5 % (ref 11.5–15.5)
WBC: 12.9 10*3/uL — ABNORMAL HIGH (ref 4.0–10.5)
nRBC: 0 % (ref 0.0–0.2)

## 2019-10-23 LAB — URINE CULTURE

## 2019-10-23 LAB — PROCALCITONIN: Procalcitonin: 0.89 ng/mL

## 2019-10-23 NOTE — Progress Notes (Signed)
Plan of care reviewed with pt. Medications discussed prior to administration; Pt alert&Ox4; no complaints of pain; prn albuterol administered for SOB and wheezing. Post treatment reported relief and 02 sat within order parameters; did experience elevated temp of 100.8, provider notified no new orders placed; safety precautions maintained; bed low and locked; call bell within reach.

## 2019-10-23 NOTE — Progress Notes (Signed)
PROGRESS NOTE    Roy Palmer  JWL:[id number] DOB: Oct 25, 1929 DOA: 10/20/2019 PCP: Venita Lick, NP   Brief Narrative:  JohnReynoldsis a89 y.o.Caucasian malewith a known history of hypertension, dyslipidemia, severe mitral regurgitation status post mitral valve repair right safe, who presented to the emergency room with acute onset of generalized weakness and shaking chills. He has been having diarrhea over the last couple of days. He started having fever after arrival in the emergency room. Chest x-ray showed a left perihilar opacity. He is diagnosed with community-acquired pneumonia, was started on Rocephin and doxycycline.  5/21.  Patient still has a high fever.  Antibiotics switched to Levaquin. CT chest confirm pneumonia, no mass.    Assessment & Plan:   Active Problems:   Sepsis ([place])  #1.  Left-sided community-acquired pneumonia with sepsis. Patient developed mild hypoxemia last night after cough.  Currently on 1.5 L oxygen.  CT scan of the chest without contrast confirms pneumonia, no mass.  Procalcitonin level 0.89.  Still has a fever at 100.8.  Patient has increased cough, with a large amount of mucus production.  I will continue Levaquin which was changed yesterday.  Recheck procalcitonin level tomorrow.  Blood culture negative.  Urine antigen was negative for strep pneumo and Legionella.  #2.  Hyponatremia. Condition improving.  3.  Chronic kidney disease stage IIIb. Continue to follow.  4.  Essential hypertension. Continue home medicines.  5.  Mild thrombocytopenia. Secondary to pneumonia and sepsis.  Condition has resolved.     DVT prophylaxis:Lovenox Code Status:Full Family Communication:None Disposition Plan:  Patient came from:Home  Anticipated d/c place:Home  Barriers to d/c OR conditions which need to be met to effect a  safe d/c: None  Consultants:  None  Procedures:None Antimicrobials: Levaquin     Subjective: Patient still had a fever last night, no shaking chills.  No coldness. Increased cough with a large amount of production.  Received albuterol treatment, cough is better today.  He was placed on oxygen due to mild dehydration after cough. Poor appetite, but no nausea vomiting abdominal pain.  No constipation or diarrhea. Patient was able to walk in the hallway with the nurse.  Objective: Vitals:   10/23/19 0027 10/23/19 0040 10/23/19 0053 10/23/19 0800  BP:    105/80  Pulse:    82  Resp:    20  Temp: (!) 100.8 F (38.2 C)   98.1 F (36.7 C)  TempSrc: Oral   Oral  SpO2: 90% (!) 88% 96% 93%  Weight:      Height:        Intake/Output Summary (Last 24 hours) at 10/23/2019 0827 Last data filed at 10/22/2019 2158 Gross per 24 hour  Intake --  Output 600 ml  Net -600 ml   Filed Weights   10/20/19 1638 10/21/19 0156  Weight: 88.9 kg 87 kg    Examination:  General exam: Appears calm and comfortable  Respiratory system: Clear to auscultation. Respiratory effort normal. Cardiovascular system: S1 & S2 heard, RRR. No JVD, murmurs, rubs, gallops or clicks. No pedal edema. Gastrointestinal system: Abdomen is nondistended, soft and nontender. No organomegaly or masses felt. Normal bowel sounds heard. Central nervous system: Alert and oriented. No focal neurological deficits. Extremities: Symmetric  Skin: No rashes, lesions or ulcers Psychiatry: Judgement and insight appear normal. Mood & affect appropriate.     Data Reviewed: I have personally reviewed following labs and imaging studies  CBC: Recent Labs  Lab 10/20/19 1634 10/21/19 0555 10/22/19 3709  10/23/19 0550  WBC 13.5* 12.8* 13.0* 12.9*  NEUTROABS  --   --  10.8* 10.9*  HGB 13.6 12.6* 12.3* 11.7*  HCT 40.0 37.6* 37.0* 34.6*  MCV 94.1 95.2 96.1 92.5  PLT 148* 140* 142* 016   Basic Metabolic Panel: Recent  Labs  Lab 10/20/19 1634 10/21/19 0555 10/22/19 0611 10/23/19 0550  NA 129* 133* 134* 134*  K 4.4 4.4 4.2 4.0  CL 97* 101 102 102  CO2 '22 24 23 25  '$ GLUCOSE 136* 120* 97 117*  BUN 26* 24* 23 22  CREATININE 1.71* 1.44* 1.63* 1.49*  CALCIUM 8.2* 7.9* 7.9* 7.8*  MG  --   --  2.2  --    GFR: Estimated Creatinine Clearance: 37.2 mL/min (A) (by C-G formula based on SCr of 1.49 mg/dL (H)). Liver Function Tests: No results for input(s): AST, ALT, ALKPHOS, BILITOT, PROT, ALBUMIN in the last 168 hours. No results for input(s): LIPASE, AMYLASE in the last 168 hours. No results for input(s): AMMONIA in the last 168 hours. Coagulation Profile: No results for input(s): INR, PROTIME in the last 168 hours. Cardiac Enzymes: No results for input(s): CKTOTAL, CKMB, CKMBINDEX, TROPONINI in the last 168 hours. BNP (last 3 results) No results for input(s): PROBNP in the last 8760 hours. HbA1C: No results for input(s): HGBA1C in the last 72 hours. CBG: No results for input(s): GLUCAP in the last 168 hours. Lipid Profile: No results for input(s): CHOL, HDL, LDLCALC, TRIG, CHOLHDL, LDLDIRECT in the last 72 hours. Thyroid Function Tests: No results for input(s): TSH, T4TOTAL, FREET4, T3FREE, THYROIDAB in the last 72 hours. Anemia Panel: No results for input(s): VITAMINB12, FOLATE, FERRITIN, TIBC, IRON, RETICCTPCT in the last 72 hours. Sepsis Labs: Recent Labs  Lab 10/20/19 2003 10/23/19 0550  PROCALCITON  --  0.89  LATICACIDVEN 1.1  --     Recent Results (from the past 240 hour(s))  SARS Coronavirus 2 by RT PCR (hospital order, performed in [facility] hospital lab) Nasopharyngeal Nasopharyngeal Swab     Status: None   Collection Time: 10/20/19  8:02 PM   Specimen: Nasopharyngeal Swab  Result Value Ref Range Status   SARS Coronavirus 2 NEGATIVE NEGATIVE Final    Comment: (NOTE) SARS-CoV-2 target nucleic acids are NOT DETECTED. The SARS-CoV-2 RNA is generally detectable in upper and  lower respiratory specimens during the acute phase of infection. The lowest concentration of SARS-CoV-2 viral copies this assay can detect is 250 copies / mL. A negative result does not preclude SARS-CoV-2 infection and should not be used as the sole basis for treatment or other patient management decisions.  A negative result may occur with improper specimen collection / handling, submission of specimen other than nasopharyngeal swab, presence of viral mutation(s) within the areas targeted by this assay, and inadequate number of viral copies (<250 copies / mL). A negative result must be combined with clinical observations, patient history, and epidemiological information. Fact Sheet for Patients:   StrictlyIdeas.no Fact Sheet for Healthcare Providers: BankingDealers.co.za This test is not yet approved or cleared  by the [place] FDA and has been authorized for detection and/or diagnosis of SARS-CoV-2 by FDA under an Emergency Use Authorization (EUA).  This EUA will remain in effect (meaning this test can be used) for the duration of the COVID-19 declaration under Section 564(b)(1) of the Act, 21 U.S.C. section 360bbb-3(b)(1), unless the authorization is terminated or revoked sooner. Performed at [facility], [address] [place], [place] [address]   Urine culture  Status: Abnormal   Collection Time: 10/20/19  8:02 PM   Specimen: In/Out Cath Urine  Result Value Ref Range Status   Specimen Description   Final    IN/OUT CATH URINE Performed at [facility], [address] [place], [place] [address]    Special Requests   Final    NONE Performed at [facility], [address] [place], [place] [address]    Culture MULTIPLE SPECIES PRESENT, SUGGEST RECOLLECTION (A)  Final   Report Status 10/23/2019 FINAL  Final  Culture, blood (routine x 2)     Status: None (Preliminary result)   Collection  Time: 10/20/19  8:03 PM   Specimen: BLOOD  Result Value Ref Range Status   Specimen Description BLOOD BLOOD RIGHT FOREARM  Final   Special Requests   Final    BOTTLES DRAWN AEROBIC AND ANAEROBIC Blood Culture adequate volume   Culture   Final    NO GROWTH 3 DAYS Performed at [facility], [address], [place], [place] [address]    Report Status PENDING  Incomplete  Culture, blood (routine x 2)     Status: None (Preliminary result)   Collection Time: 10/20/19  8:03 PM   Specimen: BLOOD  Result Value Ref Range Status   Specimen Description BLOOD RIGHT ANTECUBITAL  Final   Special Requests   Final    BOTTLES DRAWN AEROBIC AND ANAEROBIC Blood Culture adequate volume   Culture   Final    NO GROWTH 3 DAYS Performed at [facility], [address] [place], [place] [address]    Report Status PENDING  Incomplete         Radiology Studies: CT CHEST WO CONTRAST  Result Date: 10/22/2019 CLINICAL DATA:  Pneumonia, follow-up, abnormal chest radiograph EXAM: CT CHEST WITHOUT CONTRAST TECHNIQUE: Multidetector CT imaging of the chest was performed following the standard protocol without IV contrast. Sagittal and coronal MPR images reconstructed from axial data set. COMPARISON:  Chest radiograph 10/20/2019, CTA chest 03/12/2016 FINDINGS: Cardiovascular: LEFT subclavian pacemaker with leads in RIGHT atrium and RIGHT ventricle. Atherosclerotic calcifications aorta, proximal great vessels and coronary arteries. Aneurysmal dilatation ascending thoracic aorta 4.0 cm transverse image 78. Upper normal size of central pulmonary arteries. Dilatation of cardiac chambers. Post MVR and LEFT atrial appendage clipping. No pericardial effusion. Mediastinum/Nodes: Esophagus normal appearance. Base of cervical region normal appearance. No thoracic adenopathy. Lungs/Pleura: Extensive airspace consolidation LEFT upper lobe with air bronchograms consistent with lobar pneumonia. Dependent  atelectasis posterior RIGHT upper lobe and LEFT lower lobe. Small LEFT pleural effusion. Central peribronchial thickening. Pleural calcification at RIGHT diaphragm image 108. No additional infiltrate or pneumothorax. Scarring anterior RIGHT lung base. Upper Abdomen: Medication tablet in stomach. Remaining visualized upper abdomen unremarkable Musculoskeletal: No acute osseous findings. IMPRESSION: LEFT upper lobe lobar pneumonia; follow-up radiographs until resolution recommended to exclude underlying abnormalities. Small LEFT pleural effusion question reactive. Scattered atelectasis in both lungs. Enlargement of cardiac chambers with pacemaker, MVR, and prior LEFT atrial appendage clipping. Dilatation of ascending thoracic aorta 4.0 cm transverse recommendation below Recommend annual imaging followup by CTA or MRA. This recommendation follows 2010 ACCF/AHA/AATS/ACR/ASA/SCA/SCAI/SIR/STS/SVM Guidelines for the Diagnosis and Management of Patients with Thoracic Aortic Disease. Circulation. 2010; 121: Z329-J242. Aortic aneurysm NOS (ICD10-I71.9). Aortic Atherosclerosis (ICD10-I70.0) Aortic Aneurysm NOS (ICD10-I71.9). Electronically Signed   By: Lavonia Dana M.D.   On: 10/22/2019 11:32        Scheduled Meds: . amiodarone  200 mg Oral Once per day on Mon Tue Wed  Thu Fri Sat  . amLODipine  2.5 mg Oral Daily  . aspirin EC  81 mg Oral Daily  . enoxaparin (LOVENOX) injection  40 mg Subcutaneous Q24H  . guaiFENesin  600 mg Oral BID  . sodium chloride flush  3 mL Intravenous Once   Continuous Infusions: . levofloxacin (LEVAQUIN) IV       LOS: 3 days    Time spent: 32 minutes    Sharen Hones, MD [facility]   To contact the attending provider between 7A-7P or the covering provider during after hours 7P-7A, please log into the web site [email or web address] and access using universal [place] password for that web site. If you do not have the password, please call the hospital  operator.  10/23/2019, 8:27 AM

## 2019-10-24 LAB — CBC WITH DIFFERENTIAL/PLATELET
Abs Immature Granulocytes: 0.06 10*3/uL (ref 0.00–0.07)
Basophils Absolute: 0 10*3/uL (ref 0.0–0.1)
Basophils Relative: 0 %
Eosinophils Absolute: 0.6 10*3/uL — ABNORMAL HIGH (ref 0.0–0.5)
Eosinophils Relative: 6 %
HCT: 35.4 % — ABNORMAL LOW (ref 39.0–52.0)
Hemoglobin: 11.9 g/dL — ABNORMAL LOW (ref 13.0–17.0)
Immature Granulocytes: 1 %
Lymphocytes Relative: 9 %
Lymphs Abs: 1 10*3/uL (ref 0.7–4.0)
MCH: 31.3 pg (ref 26.0–34.0)
MCHC: 33.6 g/dL (ref 30.0–36.0)
MCV: 93.2 fL (ref 80.0–100.0)
Monocytes Absolute: 0.8 10*3/uL (ref 0.1–1.0)
Monocytes Relative: 8 %
Neutro Abs: 8 10*3/uL — ABNORMAL HIGH (ref 1.7–7.7)
Neutrophils Relative %: 76 %
Platelets: 204 10*3/uL (ref 150–400)
RBC: 3.8 MIL/uL — ABNORMAL LOW (ref 4.22–5.81)
RDW: 14.4 % (ref 11.5–15.5)
WBC: 10.5 10*3/uL (ref 4.0–10.5)
nRBC: 0 % (ref 0.0–0.2)

## 2019-10-24 LAB — PROCALCITONIN: Procalcitonin: 0.49 ng/mL

## 2019-10-24 LAB — MAGNESIUM: Magnesium: 2.5 mg/dL — ABNORMAL HIGH (ref 1.7–2.4)

## 2019-10-24 MED ORDER — LEVOFLOXACIN 250 MG PO TABS
250.0000 mg | ORAL_TABLET | Freq: Every day | ORAL | 0 refills | Status: AC
Start: 2019-10-25 — End: 2019-10-28

## 2019-10-24 MED ORDER — ALBUTEROL SULFATE HFA 108 (90 BASE) MCG/ACT IN AERS
2.0000 | INHALATION_SPRAY | Freq: Four times a day (QID) | RESPIRATORY_TRACT | 0 refills | Status: DC | PRN
Start: 2019-10-24 — End: 2022-05-09

## 2019-10-24 MED ORDER — GUAIFENESIN ER 600 MG PO TB12: 600.0000 mg | ORAL_TABLET | Freq: Two times a day (BID) | ORAL | 0 refills | Status: DC

## 2019-10-24 NOTE — Discharge Instructions (Signed)
1.  Complete antibiotics. 2.  Follow-up with PCP in 1 week. 3.  Annual imaging study for ascending thoracic aorta aneurysm. 4.  Repeat chest x-ray in 1 to 2 months to make sure pneumonia resolves.

## 2019-10-24 NOTE — Progress Notes (Signed)
reviewed AVS with pt and family. All questions answered. Pt verbalized understating

## 2019-10-24 NOTE — Discharge Summary (Signed)
Physician Discharge Summary  Patient ID: Roy Palmer MRN: [id number] DOB/AGE: [birth year] 86 y.o.  Admit date: 10/20/2019 Discharge date: 10/24/2019  Admission Diagnoses:  Discharge Diagnoses:  Active Problems:   Sepsis [facility])   Discharged Condition: good  Hospital Course:  JohnReynoldsis a86 y.o.Caucasian malewith a known history of hypertension, dyslipidemia, severe mitral regurgitation status post mitral valve repair right safe, who presented to the emergency room with acute onset of generalized weakness and shaking chills. He has been having diarrhea over the last couple of days. He started having fever after arrival in the emergency room. Chest x-ray showed a left perihilar opacity. He is diagnosed with community-acquired pneumonia, was started on Rocephin and doxycycline.  5/21.Patient still has a high fever. Antibiotics switched to Levaquin. CT chest confirm pneumonia, no mass.   5/23.  Patient fever came down after antibiotic switched to Levaquin.  Procalcitonin level also dropped down from 0.89 to 0.49.  No longer has any hypoxemia.  He is medically stable to be discharged.  #1.  Left-sided community-acquired pneumonia with sepsis, subsequent hypoxemia.   CT scan of the chest without contrast confirms pneumonia, no mass.  Procalcitonin level 0.89.  Blood culture negative.  Urine antigen was negative for strep pneumo and Legionella.  Patient condition did not improve and to antibiotic was switched to Levaquin.  At this point, we will continue Levaquin for additional 3 days.  I am aware that patient has a thoracic aneurysm, however, Levaquin appears to be the only reliable medicine for him.  I will treat with a short course. There is a small amount of pleural effusion on CT scan, but the month is very small, not able to tap. Patient pneumonia appears to be segmental, CT scan did not show any evidence of malignancy.  Will need repeat chest x-ray in next weeks to make  sure it resolves.  #2.  Hyponatremia. Condition improved.  3.  Chronic kidney disease stage IIIb. Stable  4.  Essential hypertension. Continue home medicines.  5.  Mild thrombocytopenia. Secondary to pneumonia and sepsis.  Condition has resolved.  6.  Thoracic aortic aneurysm. An incidental finding on CT scan.  Will need annual follow-up imaging studies.  Consults: None  Significant Diagnostic Studies:  CT CHEST WITHOUT CONTRAST  TECHNIQUE: Multidetector CT imaging of the chest was performed following the standard protocol without IV contrast. Sagittal and coronal MPR images reconstructed from axial data set.  COMPARISON:  Chest radiograph 10/20/2019, CTA chest 03/12/2016  FINDINGS: Cardiovascular: LEFT subclavian pacemaker with leads in RIGHT atrium and RIGHT ventricle. Atherosclerotic calcifications aorta, proximal great vessels and coronary arteries. Aneurysmal dilatation ascending thoracic aorta 4.0 cm transverse image 78. Upper normal size of central pulmonary arteries. Dilatation of cardiac chambers. Post MVR and LEFT atrial appendage clipping. No pericardial effusion.  Mediastinum/Nodes: Esophagus normal appearance. Base of cervical region normal appearance. No thoracic adenopathy.  Lungs/Pleura: Extensive airspace consolidation LEFT upper lobe with air bronchograms consistent with lobar pneumonia. Dependent atelectasis posterior RIGHT upper lobe and LEFT lower lobe. Small LEFT pleural effusion. Central peribronchial thickening. Pleural calcification at RIGHT diaphragm image 108. No additional infiltrate or pneumothorax. Scarring anterior RIGHT lung base.  Upper Abdomen: Medication tablet in stomach. Remaining visualized upper abdomen unremarkable  Musculoskeletal: No acute osseous findings.  IMPRESSION: LEFT upper lobe lobar pneumonia; follow-up radiographs until resolution recommended to exclude underlying abnormalities.  Small LEFT  pleural effusion question reactive.  Scattered atelectasis in both lungs.  Enlargement of cardiac chambers with pacemaker, MVR, and prior LEFT atrial  appendage clipping.  Dilatation of ascending thoracic aorta 4.0 cm transverse recommendation below Recommend annual imaging followup by CTA or MRA. This recommendation follows 2010 ACCF/AHA/AATS/ACR/ASA/SCA/SCAI/SIR/STS/SVM Guidelines for the Diagnosis and Management of Patients with Thoracic Aortic Disease. Circulation. 2010; 121: G644-I347. Aortic aneurysm NOS (ICD10-I71.9).  Aortic Atherosclerosis (ICD10-I70.0)  Aortic Aneurysm NOS (ICD10-I71.9).   Electronically Signed   By: Lavonia Dana M.D.   On: 10/22/2019 11:32  Treatments: Antibiotics with Levaquin.  Discharge Exam: Blood pressure 125/63, pulse 66, temperature 98.4 F (36.9 C), temperature source Oral, resp. rate 17, height 5\' 11"  (1.803 m), weight 87 kg, SpO2 94 %. General appearance: alert and cooperative Resp: clear to auscultation bilaterally Cardio: regular rate and rhythm, S1, S2 normal, no murmur, click, rub or gallop GI: soft, non-tender; bowel sounds normal; no masses,  no organomegaly Extremities: extremities normal, atraumatic, no cyanosis or edema  Disposition: Discharge disposition: 01-Home or Self Care       Discharge Instructions    Diet - low sodium heart healthy   Complete by: As directed    Increase activity slowly   Complete by: As directed      Allergies as of 10/24/2019      Reactions   Amiodarone Other (See Comments)   Terrible dreams/nightmares in high dosing   Lyrica [pregabalin] Nausea And Vomiting      Medication List    TAKE these medications   acetaminophen 325 MG tablet Commonly known as: TYLENOL Take 325 mg by mouth every 4 (four) hours as needed.   albuterol 108 (90 Base) MCG/ACT inhaler Commonly known as: VENTOLIN HFA Inhale 2 puffs into the lungs every 6 (six) hours as needed for wheezing or shortness of  breath.   amiodarone 200 MG tablet Commonly known as: PACERONE TAKE 1 TABLET BY MOUTH EVERY DAY [place]. DO NOT TAKE ON SUNDAY   amLODipine 2.5 MG tablet Commonly known as: NORVASC TAKE 1 TABLET(2.5 MG) BY MOUTH DAILY   aspirin EC 81 MG tablet Take 1 tablet (81 mg total) by mouth daily.   guaiFENesin 600 MG 12 hr tablet Commonly known as: MUCINEX Take 1 tablet (600 mg total) by mouth 2 (two) times daily.   levofloxacin 250 MG tablet Commonly known as: Levaquin Take 1 tablet (250 mg total) by mouth daily for 3 days. Start taking on: Oct 25, 2019   MULTIVITAMIN ADULTS 50+ PO Take 1 tablet by mouth daily.      Follow-up Information    Cannady, Jolene T, NP Follow up in 1 week(s).   Specialty: Nurse Practitioner Contact information: [address] [place] [address] [phone]         40  minutes  Signed: Sharen Hones 10/24/2019, 8:23 AM

## 2019-10-24 NOTE — Progress Notes (Signed)
Iv levofloxacin adm before DC.

## 2019-10-24 NOTE — TOC Transition Note (Signed)
Transition of Care [facility]) - CM/SW Discharge Note   Patient Details  Name: Roy Palmer MRN: [id number] Date of Birth: 10/22/1929  [facility] CM/SW Contact:  Maud Deed, LCSW Phone Number: 10/24/2019, 9:28 AM   Clinical Narrative:    CSW called pt to notify him of PT recommendations of HHPT. Pt stated that he did not want HH at this time.           Patient Goals and CMS Choice        Discharge Placement                       Discharge Plan and Services                                     Social Determinants of Health (SDOH) Interventions     Readmission Risk Interventions No flowsheet data found.

## 2019-10-25 ENCOUNTER — Telehealth: Payer: Self-pay

## 2019-10-25 LAB — CULTURE, BLOOD (ROUTINE X 2)
Culture: NO GROWTH
Culture: NO GROWTH
Special Requests: ADEQUATE
Special Requests: ADEQUATE

## 2019-10-25 NOTE — Telephone Encounter (Signed)
Noted  

## 2019-10-25 NOTE — Telephone Encounter (Signed)
Transition Care Management Follow-up Telephone Call  Date of discharge and from where: 10/24/2019 [facility]   How have you been since you were released from the hospital? "Good sitting outside"  Any questions or concerns? Yes  see note below about medication.  Items Reviewed:  Did the pt receive and understand the discharge instructions provided? No   Medications obtained and verified? Yes  hasnt picked up the levaquin yet, pharmacy states it wasn't in correctly. Verified it has been reordered today, patient can pick up from [facility].   Any new allergies since your discharge? No   Dietary orders reviewed? Yes  Do you have support at home? Yes   Functional Questionnaire: (I = Independent and D = Dependent) ADLs:   Bathing/Dressing- i  Meal Prep- i  Eating- i  Maintaining continence- i  Transferring/Ambulation- i  Managing Meds- I   Follow up appointments reviewed:   PCP Hospital f/u appt confirmed? Yes  Scheduled to see Jolene Cannady,NP  on 11/02/2019 @ 1pm .  Specialist [facility] f/u appt confirmed? Yes   Are transportation arrangements needed? No   If their condition worsens, is the pt aware to call PCP or go to the Emergency Dept.? yes  Was the patient provided with contact information for the PCP's office or ED? yes  Was to pt encouraged to call back with questions or concerns? Yes

## 2019-11-02 ENCOUNTER — Other Ambulatory Visit: Payer: Self-pay

## 2019-11-02 ENCOUNTER — Encounter: Payer: Self-pay | Admitting: Nurse Practitioner

## 2019-11-02 ENCOUNTER — Ambulatory Visit: Payer: Medicare Other | Admitting: Nurse Practitioner

## 2019-11-02 VITALS — BP 111/66 | HR 73 | Temp 97.6°F | Wt 183.6 lb

## 2019-11-02 DIAGNOSIS — I130 Hypertensive heart and chronic kidney disease with heart failure and stage 1 through stage 4 chronic kidney disease, or unspecified chronic kidney disease: Secondary | ICD-10-CM

## 2019-11-02 DIAGNOSIS — A419 Sepsis, unspecified organism: Secondary | ICD-10-CM | POA: Diagnosis not present

## 2019-11-02 DIAGNOSIS — I714 Abdominal aortic aneurysm, without rupture, unspecified: Secondary | ICD-10-CM

## 2019-11-02 DIAGNOSIS — N1832 Chronic kidney disease, stage 3b: Secondary | ICD-10-CM

## 2019-11-02 DIAGNOSIS — N183 Chronic kidney disease, stage 3 unspecified: Secondary | ICD-10-CM

## 2019-11-02 DIAGNOSIS — I5022 Chronic systolic (congestive) heart failure: Secondary | ICD-10-CM | POA: Diagnosis not present

## 2019-11-02 DIAGNOSIS — I712 Thoracic aortic aneurysm, without rupture, unspecified: Secondary | ICD-10-CM

## 2019-11-02 DIAGNOSIS — E782 Mixed hyperlipidemia: Secondary | ICD-10-CM

## 2019-11-02 NOTE — Assessment & Plan Note (Signed)
Chronic, no statin use, refuses.  With advanced age this is appropriate, will continue to monitor and focus on diet. 

## 2019-11-02 NOTE — Assessment & Plan Note (Signed)
Discussed need for repeat imaging in one year with him, he declines at this time.  Continue good BP control.  Refuses statin.

## 2019-11-02 NOTE — Progress Notes (Signed)
BP 111/66   Pulse 73   Temp 97.6 F (36.4 C) (Oral)   Wt 183 lb 9.6 oz (83.3 kg)   SpO2 94%   BMI 25.61 kg/m    Subjective:    Patient ID: Roy Palmer, male    DOB: [birth year], 86 y.o.   MRN: [id number]  HPI: Roy Palmer is a 86 y.o. male  Chief Complaint  Patient presents with  . Hospitalization Follow-up   Transition of Care Hospital Follow up.  Admitted to hospital on 10/20/2019 for CAP and treated.  He was discharged home on 10/24/19.  He reports feeling overall better, denies SOB.  Has very little coughing left.  Has completed abx treatment and reports he continues on Mucinex .  Denies fever, CP, loss of taste or smell, or congestion.  Reports feeling 95% better today.  "Hospital Course:  JohnReynoldsis a89 y.o.Caucasian malewith a known history of hypertension, dyslipidemia, severe mitral regurgitation status post mitral valve repair right safe, who presented to the emergency room with acute onset of generalized weakness and shaking chills. He has been having diarrhea over the last couple of days. He started having fever after arrival in the emergency room. Chest x-ray showed a left perihilar opacity. He is diagnosed with community-acquired pneumonia, was started on Rocephin and doxycycline.  5/21.Patient still has a high fever. Antibiotics switched to Levaquin.CT chest confirm pneumonia, no mass.  5/23.  Patient fever came down after antibiotic switched to Levaquin.  Procalcitonin level also dropped down from 0.89 to 0.49.  No longer has any hypoxemia.  He is medically stable to be discharged.  #1. Left-sided community-acquired pneumonia with sepsis, subsequent hypoxemia. CT scan of the chest without contrast confirms pneumonia, no mass. Procalcitonin level 0.89. Blood culture negative. Urine antigen was negative for strep pneumo and Legionella.  Patient condition did not improve and to antibiotic was switched to Levaquin.  At this point, we will  continue Levaquin for additional 3 days.  I am aware that patient has a thoracic aneurysm, however, Levaquin appears to be the only reliable medicine for him.  I will treat with a short course. There is a small amount of pleural effusion on CT scan, but the month is very small, not able to tap. Patient pneumonia appears to be segmental, CT scan did not show any evidence of malignancy.  Will need repeat chest x-ray in next weeks to make sure it resolves.  #2. Hyponatremia. Condition improved.  3. Chronic kidney disease stage IIIb. Stable  4. Essential hypertension. Continue home medicines.  5. Mild thrombocytopenia. Secondary to pneumoniaand sepsis. Condition has resolved.  6.  Thoracic aortic aneurysm. An incidental finding on CT scan.  Will need annual follow-up imaging studies."   Hospital/Facility: [facility] D/C Physician: Dr. Chipper Herb D/C Date: 10/24/19  Records Requested: 11/02/19 Records Received: 11/02/19 Records Reviewed: 11/02/19  Diagnoses on Discharge: Sepsis  Date of interactive Contact within 48 hours of discharge:  Contact was through: phone  Date of 7 day or 14 day face-to-face visit:    within 7 days  Outpatient Encounter Medications as of 11/02/2019  Medication Sig  . acetaminophen (TYLENOL) 325 MG tablet Take 325 mg by mouth every 4 (four) hours as needed.  Marland Kitchen albuterol (VENTOLIN HFA) 108 (90 Base) MCG/ACT inhaler Inhale 2 puffs into the lungs every 6 (six) hours as needed for wheezing or shortness of breath.  Marland Kitchen amiodarone (PACERONE) 200 MG tablet TAKE 1 TABLET BY MOUTH EVERY DAY MONDAY THROUGH SATURDAY. DO NOT TAKE  ON SUNDAY  . amLODipine (NORVASC) 2.5 MG tablet TAKE 1 TABLET(2.5 MG) BY MOUTH DAILY  . aspirin EC 81 MG tablet Take 1 tablet (81 mg total) by mouth daily.  Marland Kitchen guaiFENesin (MUCINEX) 600 MG 12 hr tablet Take 1 tablet (600 mg total) by mouth 2 (two) times daily.  . Multiple Vitamins-Minerals (MULTIVITAMIN ADULTS 50+ PO) Take 1 tablet by mouth daily.    No facility-administered encounter medications on file as of 11/02/2019.    Diagnostic Tests Reviewed/Disposition:   EXAM: CT CHEST WITHOUT CONTRAST  TECHNIQUE: Multidetector CT imaging of the chest was performed following the standard protocol without IV contrast. Sagittal and coronal MPR images reconstructed from axial data set.  COMPARISON:  Chest radiograph 10/20/2019, CTA chest 03/12/2016  FINDINGS: Cardiovascular: LEFT subclavian pacemaker with leads in RIGHT atrium and RIGHT ventricle. Atherosclerotic calcifications aorta, proximal great vessels and coronary arteries. Aneurysmal dilatation ascending thoracic aorta 4.0 cm transverse image 78. Upper normal size of central pulmonary arteries. Dilatation of cardiac chambers. Post MVR and LEFT atrial appendage clipping. No pericardial effusion.  Mediastinum/Nodes: Esophagus normal appearance. Base of cervical region normal appearance. No thoracic adenopathy.  Lungs/Pleura: Extensive airspace consolidation LEFT upper lobe with air bronchograms consistent with lobar pneumonia. Dependent atelectasis posterior RIGHT upper lobe and LEFT lower lobe. Small LEFT pleural effusion. Central peribronchial thickening. Pleural calcification at RIGHT diaphragm image 108. No additional infiltrate or pneumothorax. Scarring anterior RIGHT lung base.  Upper Abdomen: Medication tablet in stomach. Remaining visualized upper abdomen unremarkable  Musculoskeletal: No acute osseous findings.  IMPRESSION: LEFT upper lobe lobar pneumonia; follow-up radiographs until resolution recommended to exclude underlying abnormalities.  Small LEFT pleural effusion question reactive.  Scattered atelectasis in both lungs.  Enlargement of cardiac chambers with pacemaker, MVR, and prior LEFT atrial appendage clipping.  Dilatation of ascending thoracic aorta 4.0 cm transverse recommendation below Recommend annual imaging followup by CTA  or MRA. This recommendation follows 2010 ACCF/AHA/AATS/ACR/ASA/SCA/SCAI/SIR/STS/SVM Guidelines for the Diagnosis and Management of Patients with Thoracic Aortic Disease. Circulation. 2010; 121: B559-R416. Aortic aneurysm NOS (ICD10-I71.9).  Aortic Atherosclerosis (ICD10-I70.0)  Aortic Aneurysm NOS (ICD10-I71.9).   Electronically Signed   By: Ulyses Southward M.D.   On: 10/22/2019 11:32  CLINICAL DATA:  Fever.  EXAM: PORTABLE CHEST 1 VIEW  COMPARISON:  Radiograph 04/24/2017  FINDINGS: Left-sided pacemaker in place. There is left perihilar opacity that extends from the hilum to the pacemaker site. Cardiomegaly with unchanged mediastinal contours. Prosthetic cardiac valve and left atrial clipping. There is a small left pleural effusion. No pulmonary edema. No pneumothorax. Stable osseous structures.  IMPRESSION: 1. Left perihilar opacity extending from the hilum peripherally, partially obscured by battery pack from left-sided pacemaker. This may represent pneumonia in the appropriate clinical setting. Small left pleural effusion. Followup PA and lateral chest X-ray is recommended in 3-4 weeks following trial of antibiotic therapy to ensure resolution and exclude underlying malignancy. 2. Stable cardiomegaly.   Electronically Signed   By: Narda Rutherford M.D.   On: 10/20/2019 19:36  Consults: none  Discharge Instructions: follow-up with PCP  Disease/illness Education: educated patient today  Home Health/Community Services Discussions/Referrals: none  Establishment or re-establishment of referral orders for community resources: none  Discussion with other health care providers: reviewed notes  Assessment and Support of treatment regimen adherence: reviewed with patient  Appointments Coordinated with: discussed with patient  Education for self-management, independent living, and ADLs: reviewed with patient  HYPERTENSION / HYPERLIPIDEMIAWITH CHF He is  followed by cardiology and was last seen byDr. End  on 10/06/19. In 2018 his EF was 25-30%. History of v tach and has ICD in place.  On review of cardiology note patient is intolerant to all classes HF therapy. At this time is on Norvasc and Pacerone + ASA. He has a known abdominal aortic aneurysm noted on 2017 CT measuring 3.5 AP at that time. Discussed risks and benefits of low dose CT forAAAmonitoring- pt declined at this time.Recommended DASH diet, education provided.Currently taking Amiodarone, ASA, Amlodipine. Not taking statin, does not wish to.  Last cholesterol levels elevated and he reported wishes not to start medication. Satisfied with current treatment?yes Duration of hypertension:chronic BP monitoring frequency:not checking BP range:  BP medication side effects:no Duration of hyperlipidemia:chronic Cholesterol medication side effects:no Cholesterol supplements: none Aspirin:yes Recent stressors:no Recurrent headaches:no Visual changes:no Palpitations:no Dyspnea:no Chest pain:no Lower extremity edema:no Dizzy/lightheaded:no  CHRONIC KIDNEY DISEASEIII Has been seen by nephrology, last in March.  August CRT 1.86, GFR 32.   CKD status:stable Medications renally dose:no Previous renal evaluation:yes Pneumovax:Up to Date Influenza Vaccine:Up to Date   Relevant past medical, surgical, family and social history reviewed and updated as indicated. Interim medical history since our last visit reviewed. Allergies and medications reviewed and updated.  Review of Systems  Constitutional: Negative for activity change, diaphoresis, fatigue and fever.  Respiratory: Negative for cough, chest tightness, shortness of breath and wheezing.   Cardiovascular: Negative for chest pain, palpitations and leg swelling.  Gastrointestinal: Negative.   Neurological: Negative.   Psychiatric/Behavioral: Negative.     Per HPI unless specifically indicated above      Objective:    BP 111/66   Pulse 73   Temp 97.6 F (36.4 C) (Oral)   Wt 183 lb 9.6 oz (83.3 kg)   SpO2 94%   BMI 25.61 kg/m   Wt Readings from Last 3 Encounters:  11/02/19 183 lb 9.6 oz (83.3 kg)  10/21/19 191 lb 14.4 oz (87 kg)  10/06/19 190 lb 4 oz (86.3 kg)    Physical Exam Vitals and nursing note reviewed.  Constitutional:      General: He is awake. He is not in acute distress.    Appearance: He is well-developed. He is not ill-appearing.  HENT:     Head: Normocephalic and atraumatic.     Right Ear: Hearing normal. No drainage.     Left Ear: Hearing normal. No drainage.  Eyes:     General: Lids are normal.        Right eye: No discharge.        Left eye: No discharge.     Conjunctiva/sclera: Conjunctivae normal.     Pupils: Pupils are equal, round, and reactive to light.  Neck:     Thyroid: No thyromegaly.     Vascular: No carotid bruit.  Cardiovascular:     Rate and Rhythm: Normal rate and regular rhythm.     Heart sounds: Normal heart sounds, S1 normal and S2 normal. No murmur. No gallop.   Pulmonary:     Effort: Pulmonary effort is normal. No accessory muscle usage or respiratory distress.     Breath sounds: Normal breath sounds.     Comments: Lungs clear throughout today. Abdominal:     General: Bowel sounds are normal.     Palpations: Abdomen is soft.  Musculoskeletal:        General: Normal range of motion.     Cervical back: Normal range of motion and neck supple.     Right lower leg: No edema.  Left lower leg: No edema.  Skin:    General: Skin is warm and dry.  Neurological:     Mental Status: He is alert and oriented to person, place, and time.  Psychiatric:        Attention and Perception: Attention normal.        Mood and Affect: Mood normal.        Speech: Speech normal.        Behavior: Behavior normal. Behavior is cooperative.        Thought Content: Thought content normal.        Judgment: Judgment normal.     Results for orders  placed or performed during the hospital encounter of 10/20/19  SARS Coronavirus 2 by RT PCR (hospital order, performed in [facility] hospital lab) Nasopharyngeal Nasopharyngeal Swab   Specimen: Nasopharyngeal Swab  Result Value Ref Range   SARS Coronavirus 2 NEGATIVE NEGATIVE  Culture, blood (routine x 2)   Specimen: BLOOD  Result Value Ref Range   Specimen Description BLOOD BLOOD RIGHT FOREARM    Special Requests      BOTTLES DRAWN AEROBIC AND ANAEROBIC Blood Culture adequate volume   Culture      NO GROWTH 5 DAYS Performed at [facility], [address] [place], [place] [address]    Report Status 10/25/2019 FINAL   Culture, blood (routine x 2)   Specimen: BLOOD  Result Value Ref Range   Specimen Description BLOOD RIGHT ANTECUBITAL    Special Requests      BOTTLES DRAWN AEROBIC AND ANAEROBIC Blood Culture adequate volume   Culture      NO GROWTH 5 DAYS Performed at [facility], [address] [place], [place] [address]    Report Status 10/25/2019 FINAL   Urine culture   Specimen: In/Out Cath Urine  Result Value Ref Range   Specimen Description      IN/OUT CATH URINE Performed at [facility], [address] [place], [place] [address]    Special Requests      NONE Performed at [facility], [address] [place], [place] [address]    Culture MULTIPLE SPECIES PRESENT, SUGGEST RECOLLECTION (A)    Report Status 10/23/2019 FINAL   Basic metabolic panel  Result Value Ref Range   Sodium 129 (L) 135 - 145 mmol/L   Potassium 4.4 3.5 - 5.1 mmol/L   Chloride 97 (L) 98 - 111 mmol/L   CO2 22 22 - 32 mmol/L   Glucose, Bld 136 (H) 70 - 99 mg/dL   BUN 26 (H) 8 - 23 mg/dL   Creatinine, Ser 5.45 (H) 0.61 - 1.24 mg/dL   Calcium 8.2 (L) 8.9 - 10.3 mg/dL   GFR calc non Af Amer 35 (L) >60 mL/min   GFR calc Af Amer 41 (L) >60 mL/min   Anion gap 10 5 - 15  CBC  Result Value Ref Range   WBC 13.5 (H) 4.0 - 10.5 K/uL   RBC 4.25 4.22 - 5.81  MIL/uL   Hemoglobin 13.6 13.0 - 17.0 g/dL   HCT 62.5 63.8 - 93.7 %   MCV 94.1 80.0 - 100.0 fL   MCH 32.0 26.0 - 34.0 pg   MCHC 34.0 30.0 - 36.0 g/dL   RDW 34.2 87.6 - 81.1 %   Platelets 148 (L) 150 - 400 K/uL   nRBC 0.0 0.0 - 0.2 %  Urinalysis, Complete w Microscopic  Result Value Ref Range   Color, Urine YELLOW (A) YELLOW  APPearance CLEAR (A) CLEAR   Specific Gravity, Urine 1.011 1.005 - 1.030   pH 5.0 5.0 - 8.0   Glucose, UA NEGATIVE NEGATIVE mg/dL   Hgb urine dipstick SMALL (A) NEGATIVE   Bilirubin Urine NEGATIVE NEGATIVE   Ketones, ur NEGATIVE NEGATIVE mg/dL   Protein, ur NEGATIVE NEGATIVE mg/dL   Nitrite NEGATIVE NEGATIVE   Leukocytes,Ua NEGATIVE NEGATIVE   RBC / HPF 0-5 0 - 5 RBC/hpf   WBC, UA 0-5 0 - 5 WBC/hpf   Bacteria, UA RARE (A) NONE SEEN   Squamous Epithelial / LPF NONE SEEN 0 - 5   Mucus PRESENT    Hyaline Casts, UA PRESENT   Lactic acid, plasma  Result Value Ref Range   Lactic Acid, Venous 1.1 0.5 - 1.9 mmol/L  HIV Antibody (routine testing w rflx)  Result Value Ref Range   HIV Screen 4th Generation wRfx Non Reactive Non Reactive  Basic metabolic panel  Result Value Ref Range   Sodium 133 (L) 135 - 145 mmol/L   Potassium 4.4 3.5 - 5.1 mmol/L   Chloride 101 98 - 111 mmol/L   CO2 24 22 - 32 mmol/L   Glucose, Bld 120 (H) 70 - 99 mg/dL   BUN 24 (H) 8 - 23 mg/dL   Creatinine, Ser 1.47 (H) 0.61 - 1.24 mg/dL   Calcium 7.9 (L) 8.9 - 10.3 mg/dL   GFR calc non Af Amer 43 (L) >60 mL/min   GFR calc Af Amer 50 (L) >60 mL/min   Anion gap 8 5 - 15  CBC  Result Value Ref Range   WBC 12.8 (H) 4.0 - 10.5 K/uL   RBC 3.95 (L) 4.22 - 5.81 MIL/uL   Hemoglobin 12.6 (L) 13.0 - 17.0 g/dL   HCT 82.9 (L) 56.2 - 13.0 %   MCV 95.2 80.0 - 100.0 fL   MCH 31.9 26.0 - 34.0 pg   MCHC 33.5 30.0 - 36.0 g/dL   RDW 86.5 78.4 - 69.6 %   Platelets 140 (L) 150 - 400 K/uL   nRBC 0.0 0.0 - 0.2 %  Legionella Pneumophila Serogp 1 Ur Ag  Result Value Ref Range   L. pneumophila Serogp  1 Ur Ag Negative Negative   Source of Sample URINE, CLEAN CATCH   Strep pneumoniae urinary antigen  Result Value Ref Range   Strep Pneumo Urinary Antigen NEGATIVE NEGATIVE  CBC with Differential/Platelet  Result Value Ref Range   WBC 13.0 (H) 4.0 - 10.5 K/uL   RBC 3.85 (L) 4.22 - 5.81 MIL/uL   Hemoglobin 12.3 (L) 13.0 - 17.0 g/dL   HCT 29.5 (L) 28.4 - 13.2 %   MCV 96.1 80.0 - 100.0 fL   MCH 31.9 26.0 - 34.0 pg   MCHC 33.2 30.0 - 36.0 g/dL   RDW 44.0 10.2 - 72.5 %   Platelets 142 (L) 150 - 400 K/uL   nRBC 0.0 0.0 - 0.2 %   Neutrophils Relative % 84 %   Neutro Abs 10.8 (H) 1.7 - 7.7 K/uL   Lymphocytes Relative 8 %   Lymphs Abs 1.0 0.7 - 4.0 K/uL   Monocytes Relative 8 %   Monocytes Absolute 1.1 (H) 0.1 - 1.0 K/uL   Eosinophils Relative 0 %   Eosinophils Absolute 0.0 0.0 - 0.5 K/uL   Basophils Relative 0 %   Basophils Absolute 0.0 0.0 - 0.1 K/uL   Immature Granulocytes 0 %   Abs Immature Granulocytes 0.05 0.00 - 0.07 K/uL  Basic metabolic panel  Result Value Ref Range   Sodium 134 (L) 135 - 145 mmol/L   Potassium 4.2 3.5 - 5.1 mmol/L   Chloride 102 98 - 111 mmol/L   CO2 23 22 - 32 mmol/L   Glucose, Bld 97 70 - 99 mg/dL   BUN 23 8 - 23 mg/dL   Creatinine, Ser 1.63 (H) 0.61 - 1.24 mg/dL   Calcium 7.9 (L) 8.9 - 10.3 mg/dL   GFR calc non Af Amer 37 (L) >60 mL/min   GFR calc Af Amer 43 (L) >60 mL/min   Anion gap 9 5 - 15  Magnesium  Result Value Ref Range   Magnesium 2.2 1.7 - 2.4 mg/dL  CBC with Differential/Platelet  Result Value Ref Range   WBC 12.9 (H) 4.0 - 10.5 K/uL   RBC 3.74 (L) 4.22 - 5.81 MIL/uL   Hemoglobin 11.7 (L) 13.0 - 17.0 g/dL   HCT 34.6 (L) 39.0 - 52.0 %   MCV 92.5 80.0 - 100.0 fL   MCH 31.3 26.0 - 34.0 pg   MCHC 33.8 30.0 - 36.0 g/dL   RDW 14.5 11.5 - 15.5 %   Platelets 166 150 - 400 K/uL   nRBC 0.0 0.0 - 0.2 %   Neutrophils Relative % 85 %   Neutro Abs 10.9 (H) 1.7 - 7.7 K/uL   Lymphocytes Relative 7 %   Lymphs Abs 0.9 0.7 - 4.0 K/uL    Monocytes Relative 7 %   Monocytes Absolute 0.9 0.1 - 1.0 K/uL   Eosinophils Relative 0 %   Eosinophils Absolute 0.0 0.0 - 0.5 K/uL   Basophils Relative 0 %   Basophils Absolute 0.0 0.0 - 0.1 K/uL   Immature Granulocytes 1 %   Abs Immature Granulocytes 0.06 0.00 - 0.07 K/uL  Basic metabolic panel  Result Value Ref Range   Sodium 134 (L) 135 - 145 mmol/L   Potassium 4.0 3.5 - 5.1 mmol/L   Chloride 102 98 - 111 mmol/L   CO2 25 22 - 32 mmol/L   Glucose, Bld 117 (H) 70 - 99 mg/dL   BUN 22 8 - 23 mg/dL   Creatinine, Ser 1.49 (H) 0.61 - 1.24 mg/dL   Calcium 7.8 (L) 8.9 - 10.3 mg/dL   GFR calc non Af Amer 42 (L) >60 mL/min   GFR calc Af Amer 48 (L) >60 mL/min   Anion gap 7 5 - 15  Procalcitonin - Baseline  Result Value Ref Range   Procalcitonin 0.89 ng/mL  Basic metabolic panel  Result Value Ref Range   Sodium 132 (L) 135 - 145 mmol/L   Potassium 3.8 3.5 - 5.1 mmol/L   Chloride 98 98 - 111 mmol/L   CO2 24 22 - 32 mmol/L   Glucose, Bld 108 (H) 70 - 99 mg/dL   BUN 22 8 - 23 mg/dL   Creatinine, Ser 1.56 (H) 0.61 - 1.24 mg/dL   Calcium 7.8 (L) 8.9 - 10.3 mg/dL   GFR calc non Af Amer 39 (L) >60 mL/min   GFR calc Af Amer 46 (L) >60 mL/min   Anion gap 10 5 - 15  CBC with Differential/Platelet  Result Value Ref Range   WBC 10.5 4.0 - 10.5 K/uL   RBC 3.80 (L) 4.22 - 5.81 MIL/uL   Hemoglobin 11.9 (L) 13.0 - 17.0 g/dL   HCT 35.4 (L) 39.0 - 52.0 %   MCV 93.2 80.0 - 100.0 fL   MCH 31.3 26.0 - 34.0 pg   MCHC 33.6 30.0 -  36.0 g/dL   RDW 16.1 09.6 - 04.5 %   Platelets 204 150 - 400 K/uL   nRBC 0.0 0.0 - 0.2 %   Neutrophils Relative % 76 %   Neutro Abs 8.0 (H) 1.7 - 7.7 K/uL   Lymphocytes Relative 9 %   Lymphs Abs 1.0 0.7 - 4.0 K/uL   Monocytes Relative 8 %   Monocytes Absolute 0.8 0.1 - 1.0 K/uL   Eosinophils Relative 6 %   Eosinophils Absolute 0.6 (H) 0.0 - 0.5 K/uL   Basophils Relative 0 %   Basophils Absolute 0.0 0.0 - 0.1 K/uL   Immature Granulocytes 1 %   Abs Immature  Granulocytes 0.06 0.00 - 0.07 K/uL  Magnesium  Result Value Ref Range   Magnesium 2.5 (H) 1.7 - 2.4 mg/dL  Procalcitonin - Baseline  Result Value Ref Range   Procalcitonin 0.49 ng/mL  Troponin I (High Sensitivity)  Result Value Ref Range   Troponin I (High Sensitivity) 20 (H) <18 ng/L  Troponin I (High Sensitivity)  Result Value Ref Range   Troponin I (High Sensitivity) 24 (H) <18 ng/L      Assessment & Plan:   Problem List Items Addressed This Visit      Cardiovascular and Mediastinum   AAA (abdominal aortic aneurysm) without rupture (HCC)    Chronic, ongoing. Risks and benefits of low dose CT discussed- pt declined. Continue BP management as prescribed and monitor for symptoms.   He refuses statin.      Thoracic aortic aneurysm [facility])    Discussed need for repeat imaging in one year with him, he declines at this time.  Continue good BP control.  Refuses statin.      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  BP remains below goal for age.  Continue current medication regimen and collaboration with cardiology and nephrology. BMP today. Return in 3 months.       Chronic systolic heart failure (HCC)    Continue collaboration with cardiology and review recommendations. - Reminded to call for an overnight weight gain of >2 pounds or a weekly weight weight of >5 pounds - not adding salt to his food and has been reading food labels. Reviewed the importance of keeping daily sodium intake to 2000mg  daily         Genitourinary   CKD (chronic kidney disease), stage III    Chronic, ongoing.  Continue collaboration with nephrology and review recommendations.  BMP today.        Other   Hyperlipidemia    Chronic, no statin use, refuses.  With advanced age this is appropriate, will continue to monitor and focus on diet.      Sepsis (HCC) - Primary    Acute and improving, reports being 95% better.  Was treated for PNA in  hospital.  Will plan for repeat CXR in 6 weeks, around end of June, discussed with patient and provided information to him on location to obtain imaging.  Continue to take Mucinex as needed.  Recommend good fluid intake at home and plenty of rest.  CBC and BMP today. Return to office in 3 months, sooner if any return or worsening of symptoms.      Relevant Orders   DG Chest 2 View   CBC with Differential/Platelet   Basic metabolic panel       Follow up plan: Return in about 3 months (around 02/02/2020) for HTN/HLD, Aneurysm.

## 2019-11-02 NOTE — Assessment & Plan Note (Signed)
Continue collaboration with cardiology and review recommendations. - Reminded to call for an overnight weight gain of >2 pounds or a weekly weight weight of >5 pounds - not adding salt to his food and has been reading food labels. Reviewed the importance of keeping daily sodium intake to 2000mg  daily

## 2019-11-02 NOTE — Assessment & Plan Note (Addendum)
Chronic, ongoing.  BP remains below goal for age.  Continue current medication regimen and collaboration with cardiology and nephrology. BMP today. Return in 3 months.

## 2019-11-02 NOTE — Assessment & Plan Note (Addendum)
Acute and improving, reports being 95% better.  Was treated for PNA in hospital.  Will plan for repeat CXR in 6 weeks, around end of June, discussed with patient and provided information to him on location to obtain imaging.  Continue to take Mucinex as needed.  Recommend good fluid intake at home and plenty of rest.  CBC and BMP today. Return to office in 3 months, sooner if any return or worsening of symptoms.

## 2019-11-02 NOTE — Patient Instructions (Signed)
Go to [facility] imaging area at end of June to obtain chest x-ray to assess pneumonia  -- around June 30th   Community-Acquired Pneumonia, Adult Pneumonia is an infection of the lungs. It causes swelling in the airways of the lungs. Mucus and fluid may also build up inside the airways. One type of pneumonia can happen while a person is in a hospital. A different type can happen when a person is not in a hospital (community-acquired pneumonia).  What are the causes?  This condition is caused by germs (viruses, bacteria, or fungi). Some types of germs can be passed from one person to another. This can happen when you breathe in droplets from the cough or sneeze of an infected person. What increases the risk? You are more likely to develop this condition if you:  Have a long-term (chronic) disease, such as: ? Chronic obstructive pulmonary disease (COPD). ? Asthma. ? Cystic fibrosis. ? Congestive heart failure. ? Diabetes. ? Kidney disease.  Have HIV.  Have sickle cell disease.  Have had your spleen removed.  Do not take good care of your teeth and mouth (poor dental hygiene).  Have a medical condition that increases the risk of breathing in droplets from your own mouth and nose.  Have a weakened body defense system (immune system).  Are a smoker.  Travel to areas where the germs that cause this illness are common.  Are around certain animals or the places they live. What are the signs or symptoms?  A dry cough.  A wet (productive) cough.  Fever.  Sweating.  Chest pain. This often happens when breathing deeply or coughing.  Fast breathing or trouble breathing.  Shortness of breath.  Shaking chills.  Feeling tired (fatigue).  Muscle aches. How is this treated? Treatment for this condition depends on many things. Most adults can be treated at home. In some cases, treatment must happen in a hospital. Treatment may include:  Medicines given by  mouth or through an IV tube.  Being given extra oxygen.  Respiratory therapy. In rare cases, treatment for very bad pneumonia may include:  Using a machine to help you breathe.  Having a procedure to remove fluid from around your lungs. Follow these instructions at home: Medicines  Take over-the-counter and prescription medicines only as told by your doctor. ? Only take cough medicine if you are losing sleep.  If you were prescribed an antibiotic medicine, take it as told by your doctor. Do not stop taking the antibiotic even if you start to feel better. General instructions   Sleep with your head and neck raised (elevated). You can do this by sleeping in a recliner or by putting a few pillows under your head.  Rest as needed. Get at least 8 hours of sleep each night.  Drink enough water to keep your pee (urine) pale yellow.  Eat a healthy diet that includes plenty of vegetables, fruits, whole grains, low-fat dairy products, and lean protein.  Do not use any products that contain nicotine or tobacco. These include cigarettes, e-cigarettes, and chewing tobacco. If you need help quitting, ask your doctor.  Keep all follow-up visits as told by your doctor. This is important. How is this prevented? A shot (vaccine) can help prevent pneumonia. Shots are often suggested for:  People older than 84 years of age.  People older than 84 years of age who: ? Are having cancer treatment. ? Have long-term (chronic) lung disease. ? Have problems with their body's  defense system. You may also prevent pneumonia if you take these actions:  Get the flu (influenza) shot every year.  Go to the dentist as often as told.  Wash your hands often. If you cannot use soap and water, use hand sanitizer. Contact a doctor if:  You have a fever.  You lose sleep because your cough medicine does not help. Get help right away if:  You are short of breath and it gets worse.  You have more chest  pain.  Your sickness gets worse. This is very serious if: ? You are an older adult. ? Your body's defense system is weak.  You cough up blood. Summary  Pneumonia is an infection of the lungs.  Most adults can be treated at home. Some will need treatment in a hospital.  Drink enough water to keep your pee pale yellow.  Get at least 8 hours of sleep each night. This information is not intended to replace advice given to you by your health care provider. Make sure you discuss any questions you have with your health care provider. Document Revised: 09/09/2018 Document Reviewed: 01/15/2018 Elsevier Patient Education  2020 [employer].

## 2019-11-02 NOTE — Assessment & Plan Note (Signed)
Chronic, ongoing. Risks and benefits of low dose CT discussed- pt declined. Continue BP management as prescribed and monitor for symptoms.   He refuses statin.

## 2019-11-02 NOTE — Assessment & Plan Note (Signed)
Chronic, ongoing.  Continue collaboration with nephrology and review recommendations.  BMP today.

## 2019-11-03 LAB — CBC WITH DIFFERENTIAL/PLATELET
Basophils Absolute: 0.1 10*3/uL (ref 0.0–0.2)
Basos: 1 %
EOS (ABSOLUTE): 0.2 10*3/uL (ref 0.0–0.4)
Eos: 3 %
Hematocrit: 43.3 % (ref 37.5–51.0)
Hemoglobin: 13.7 g/dL (ref 13.0–17.7)
Immature Grans (Abs): 0 10*3/uL (ref 0.0–0.1)
Immature Granulocytes: 0 %
Lymphocytes Absolute: 1.6 10*3/uL (ref 0.7–3.1)
Lymphs: 18 %
MCH: 30.8 pg (ref 26.6–33.0)
MCHC: 31.6 g/dL (ref 31.5–35.7)
MCV: 97 fL (ref 79–97)
Monocytes Absolute: 0.5 10*3/uL (ref 0.1–0.9)
Monocytes: 6 %
Neutrophils Absolute: 6.6 10*3/uL (ref 1.4–7.0)
Neutrophils: 72 %
Platelets: 398 10*3/uL (ref 150–450)
RBC: 4.45 x10E6/uL (ref 4.14–5.80)
RDW: 13 % (ref 11.6–15.4)
WBC: 9.1 10*3/uL (ref 3.4–10.8)

## 2019-11-03 LAB — BASIC METABOLIC PANEL
BUN/Creatinine Ratio: 19 (ref 10–24)
BUN: 34 mg/dL — ABNORMAL HIGH (ref 8–27)
CO2: 21 mmol/L (ref 20–29)
Calcium: 8.8 mg/dL (ref 8.6–10.2)
Chloride: 99 mmol/L (ref 96–106)
Creatinine, Ser: 1.79 mg/dL — ABNORMAL HIGH (ref 0.76–1.27)
GFR calc Af Amer: 39 mL/min/{1.73_m2} — ABNORMAL LOW (ref >59)
GFR calc non Af Amer: 33 mL/min/{1.73_m2} — ABNORMAL LOW (ref >59)
Glucose: 128 mg/dL — ABNORMAL HIGH (ref 65–99)
Potassium: 5.1 mmol/L (ref 3.5–5.2)
Sodium: 135 mmol/L (ref 134–144)

## 2019-11-03 NOTE — Progress Notes (Signed)
Contacted via MyChart Good evening Roy Palmer, your labs have returned and overall kidney function is remaining at your baseline.  Continue to visit with your kidney doctor.  CBC is normal.  Keep up the great work.  Have a wonderful evening. Keep being awesome!! Kindest regards, Yesica Kemler

## 2019-11-09 ENCOUNTER — Other Ambulatory Visit: Payer: Self-pay | Admitting: Cardiovascular Disease

## 2019-12-07 ENCOUNTER — Other Ambulatory Visit: Payer: Self-pay | Admitting: Internal Medicine

## 2019-12-07 DIAGNOSIS — I472 Ventricular tachycardia, unspecified: Secondary | ICD-10-CM

## 2019-12-07 DIAGNOSIS — I5022 Chronic systolic (congestive) heart failure: Secondary | ICD-10-CM

## 2019-12-07 DIAGNOSIS — Z95810 Presence of automatic (implantable) cardiac defibrillator: Secondary | ICD-10-CM

## 2019-12-07 DIAGNOSIS — I428 Other cardiomyopathies: Secondary | ICD-10-CM

## 2019-12-19 DIAGNOSIS — G510 Bell's palsy: Secondary | ICD-10-CM | POA: Insufficient documentation

## 2019-12-20 ENCOUNTER — Ambulatory Visit (INDEPENDENT_AMBULATORY_CARE_PROVIDER_SITE_OTHER): Payer: Medicare Other | Admitting: *Deleted

## 2019-12-20 DIAGNOSIS — I472 Ventricular tachycardia, unspecified: Secondary | ICD-10-CM

## 2019-12-20 DIAGNOSIS — I428 Other cardiomyopathies: Secondary | ICD-10-CM

## 2019-12-21 ENCOUNTER — Other Ambulatory Visit: Payer: Self-pay

## 2019-12-21 ENCOUNTER — Observation Stay: Payer: Medicare Other

## 2019-12-21 ENCOUNTER — Emergency Department: Payer: Medicare Other

## 2019-12-21 ENCOUNTER — Observation Stay (HOSPITAL_BASED_OUTPATIENT_CLINIC_OR_DEPARTMENT_OTHER)
Admit: 2019-12-21 | Discharge: 2019-12-21 | Disposition: A | Discharge status: Home / Self Care | Payer: Medicare Other | Attending: Internal Medicine | Admitting: Internal Medicine

## 2019-12-21 ENCOUNTER — Observation Stay
Admission: EM | Admit: 2019-12-21 | Discharge: 2019-12-22 | Disposition: A | Discharge status: Home / Self Care | Payer: Medicare Other | Attending: Internal Medicine | Admitting: Internal Medicine

## 2019-12-21 DIAGNOSIS — N183 Chronic kidney disease, stage 3 unspecified: Secondary | ICD-10-CM | POA: Diagnosis present

## 2019-12-21 DIAGNOSIS — N1831 Chronic kidney disease, stage 3a: Secondary | ICD-10-CM | POA: Diagnosis not present

## 2019-12-21 DIAGNOSIS — E785 Hyperlipidemia, unspecified: Secondary | ICD-10-CM | POA: Diagnosis not present

## 2019-12-21 DIAGNOSIS — E782 Mixed hyperlipidemia: Secondary | ICD-10-CM | POA: Diagnosis not present

## 2019-12-21 DIAGNOSIS — Z79899 Other long term (current) drug therapy: Secondary | ICD-10-CM | POA: Insufficient documentation

## 2019-12-21 DIAGNOSIS — I38 Endocarditis, valve unspecified: Secondary | ICD-10-CM | POA: Diagnosis present

## 2019-12-21 DIAGNOSIS — G500 Trigeminal neuralgia: Secondary | ICD-10-CM | POA: Insufficient documentation

## 2019-12-21 DIAGNOSIS — N1832 Chronic kidney disease, stage 3b: Secondary | ICD-10-CM

## 2019-12-21 DIAGNOSIS — R29810 Facial weakness: Principal | ICD-10-CM | POA: Diagnosis present

## 2019-12-21 DIAGNOSIS — Z20822 Contact with and (suspected) exposure to covid-19: Secondary | ICD-10-CM | POA: Diagnosis not present

## 2019-12-21 DIAGNOSIS — I4729 Other ventricular tachycardia: Secondary | ICD-10-CM

## 2019-12-21 DIAGNOSIS — Z7982 Long term (current) use of aspirin: Secondary | ICD-10-CM | POA: Insufficient documentation

## 2019-12-21 DIAGNOSIS — I472 Ventricular tachycardia: Secondary | ICD-10-CM | POA: Insufficient documentation

## 2019-12-21 DIAGNOSIS — I130 Hypertensive heart and chronic kidney disease with heart failure and stage 1 through stage 4 chronic kidney disease, or unspecified chronic kidney disease: Secondary | ICD-10-CM | POA: Diagnosis not present

## 2019-12-21 DIAGNOSIS — I5022 Chronic systolic (congestive) heart failure: Secondary | ICD-10-CM | POA: Diagnosis present

## 2019-12-21 DIAGNOSIS — I10 Essential (primary) hypertension: Secondary | ICD-10-CM | POA: Diagnosis present

## 2019-12-21 DIAGNOSIS — I6389 Other cerebral infarction: Secondary | ICD-10-CM

## 2019-12-21 LAB — CUP PACEART REMOTE DEVICE CHECK
Battery Remaining Longevity: 51 mo
Battery Voltage: 2.97 V
Brady Statistic AP VP Percent: 66.23 %
Brady Statistic AP VS Percent: 0.09 %
Brady Statistic AS VP Percent: 33.2 %
Brady Statistic AS VS Percent: 0.48 %
Brady Statistic RA Percent Paced: 65.96 %
Brady Statistic RV Percent Paced: 99.2 %
Date Time Interrogation Session: 20210719033327
HighPow Impedance: 65 Ohm
Implantable Lead Implant Date: 20171025
Implantable Lead Implant Date: 20171025
Implantable Lead Location: 753859
Implantable Lead Location: 753860
Implantable Lead Model: 5076
Implantable Pulse Generator Implant Date: 20171025
Lead Channel Impedance Value: 342 Ohm
Lead Channel Impedance Value: 361 Ohm
Lead Channel Impedance Value: 418 Ohm
Lead Channel Pacing Threshold Amplitude: 0.625 V
Lead Channel Pacing Threshold Amplitude: 0.625 V
Lead Channel Pacing Threshold Pulse Width: 0.4 ms
Lead Channel Pacing Threshold Pulse Width: 0.4 ms
Lead Channel Sensing Intrinsic Amplitude: 2.5 mV
Lead Channel Sensing Intrinsic Amplitude: 2.5 mV
Lead Channel Sensing Intrinsic Amplitude: 5.5 mV
Lead Channel Sensing Intrinsic Amplitude: 5.5 mV
Lead Channel Setting Pacing Amplitude: 2 V
Lead Channel Setting Pacing Amplitude: 2.5 V
Lead Channel Setting Pacing Pulse Width: 0.4 ms
Lead Channel Setting Sensing Sensitivity: 0.3 mV

## 2019-12-21 LAB — COMPREHENSIVE METABOLIC PANEL
ALT: 18 U/L (ref 0–44)
AST: 17 U/L (ref 15–41)
Albumin: 3.9 g/dL (ref 3.5–5.0)
Alkaline Phosphatase: 48 U/L (ref 38–126)
Anion gap: 8 (ref 5–15)
BUN: 26 mg/dL — ABNORMAL HIGH (ref 8–23)
CO2: 27 mmol/L (ref 22–32)
Calcium: 8.6 mg/dL — ABNORMAL LOW (ref 8.9–10.3)
Chloride: 102 mmol/L (ref 98–111)
Creatinine, Ser: 1.81 mg/dL — ABNORMAL HIGH (ref 0.61–1.24)
GFR calc Af Amer: 38 mL/min — ABNORMAL LOW (ref >60)
GFR calc non Af Amer: 33 mL/min — ABNORMAL LOW (ref >60)
Glucose, Bld: 106 mg/dL — ABNORMAL HIGH (ref 70–99)
Potassium: 4.7 mmol/L (ref 3.5–5.1)
Sodium: 137 mmol/L (ref 135–145)
Total Bilirubin: 0.8 mg/dL (ref 0.3–1.2)
Total Protein: 6.8 g/dL (ref 6.5–8.1)

## 2019-12-21 LAB — CBC
HCT: 44.7 % (ref 39.0–52.0)
Hemoglobin: 14.7 g/dL (ref 13.0–17.0)
MCH: 31.7 pg (ref 26.0–34.0)
MCHC: 32.9 g/dL (ref 30.0–36.0)
MCV: 96.3 fL (ref 80.0–100.0)
Platelets: 147 10*3/uL — ABNORMAL LOW (ref 150–400)
RBC: 4.64 MIL/uL (ref 4.22–5.81)
RDW: 14.6 % (ref 11.5–15.5)
WBC: 6.9 10*3/uL (ref 4.0–10.5)
nRBC: 0 % (ref 0.0–0.2)

## 2019-12-21 LAB — DIFFERENTIAL
Abs Immature Granulocytes: 0.02 10*3/uL (ref 0.00–0.07)
Basophils Absolute: 0.1 10*3/uL (ref 0.0–0.1)
Basophils Relative: 1 %
Eosinophils Absolute: 0.2 10*3/uL (ref 0.0–0.5)
Eosinophils Relative: 3 %
Immature Granulocytes: 0 %
Lymphocytes Relative: 14 %
Lymphs Abs: 1 10*3/uL (ref 0.7–4.0)
Monocytes Absolute: 0.7 10*3/uL (ref 0.1–1.0)
Monocytes Relative: 10 %
Neutro Abs: 4.9 10*3/uL (ref 1.7–7.7)
Neutrophils Relative %: 72 %

## 2019-12-21 LAB — GLUCOSE, CAPILLARY: Glucose-Capillary: 112 mg/dL — ABNORMAL HIGH (ref 70–99)

## 2019-12-21 LAB — APTT: aPTT: 29 seconds (ref 24–36)

## 2019-12-21 LAB — PROTIME-INR
INR: 1 (ref 0.8–1.2)
Prothrombin Time: 12.9 seconds (ref 11.4–15.2)

## 2019-12-21 LAB — BRAIN NATRIURETIC PEPTIDE: B Natriuretic Peptide: 149 pg/mL — ABNORMAL HIGH (ref 0.0–100.0)

## 2019-12-21 LAB — SARS CORONAVIRUS 2 BY RT PCR (HOSPITAL ORDER, PERFORMED IN ~~LOC~~ HOSPITAL LAB): SARS Coronavirus 2: NEGATIVE

## 2019-12-21 MED ORDER — SENNOSIDES-DOCUSATE SODIUM 8.6-50 MG PO TABS: 1.0000 | ORAL_TABLET | Freq: Every evening | ORAL | Status: DC | PRN

## 2019-12-21 MED ORDER — AMIODARONE HCL 200 MG PO TABS
200.0000 mg | ORAL_TABLET | ORAL | Status: DC
  Administered 2019-12-22: 200 mg via ORAL
  Filled 2019-12-21: qty 1

## 2019-12-21 MED ORDER — ALBUTEROL SULFATE (2.5 MG/3ML) 0.083% IN NEBU: 2.5000 mg | INHALATION_SOLUTION | Freq: Four times a day (QID) | RESPIRATORY_TRACT | Status: DC | PRN

## 2019-12-21 MED ORDER — ASPIRIN EC 81 MG PO TBEC
81.0000 mg | DELAYED_RELEASE_TABLET | Freq: Every day | ORAL | Status: DC
  Administered 2019-12-22: 81 mg via ORAL
  Filled 2019-12-21 (×2): qty 1

## 2019-12-21 MED ORDER — ACETAMINOPHEN 325 MG PO TABS
650.0000 mg | ORAL_TABLET | Freq: Four times a day (QID) | ORAL | Status: DC | PRN
  Administered 2019-12-22: 08:00:00 650 mg via ORAL
  Filled 2019-12-21: qty 2

## 2019-12-21 MED ORDER — STROKE: EARLY STAGES OF RECOVERY BOOK: Freq: Once | Status: AC

## 2019-12-21 MED ORDER — ENOXAPARIN SODIUM 40 MG/0.4ML ~~LOC~~ SOLN
40.0000 mg | SUBCUTANEOUS | Status: DC
  Administered 2019-12-21: 21:00:00 40 mg via SUBCUTANEOUS
  Filled 2019-12-21: qty 0.4

## 2019-12-21 MED ORDER — ADULT MULTIVITAMIN W/MINERALS CH
1.0000 | ORAL_TABLET | Freq: Every day | ORAL | Status: DC
  Administered 2019-12-21 – 2019-12-22 (×2): 1 via ORAL
  Filled 2019-12-21 (×2): qty 1

## 2019-12-21 MED ORDER — GUAIFENESIN ER 600 MG PO TB12
600.0000 mg | ORAL_TABLET | Freq: Two times a day (BID) | ORAL | Status: DC | PRN
  Filled 2019-12-21: qty 1

## 2019-12-21 MED ORDER — SODIUM CHLORIDE 0.9% FLUSH
3.0000 mL | Freq: Once | INTRAVENOUS | Status: DC
Start: 2019-12-21 — End: 2019-12-22

## 2019-12-21 MED ORDER — AMLODIPINE BESYLATE 5 MG PO TABS
2.5000 mg | ORAL_TABLET | Freq: Every day | ORAL | Status: DC
  Administered 2019-12-22: 09:00:00 2.5 mg via ORAL
  Filled 2019-12-21: qty 1

## 2019-12-21 NOTE — ED Provider Notes (Signed)
[facility] Emergency Department Provider Note   ____________________________________________   First MD Initiated Contact with Patient 12/21/19 1242     (approximate)  I have reviewed the triage vital signs and the nursing notes.   HISTORY  Chief Complaint Facial Droop    HPI Roy Palmer is a 86 y.o. male patient with history of trigeminal neuralgia, mitral valve repair, presenting with isolated facial droop.  Patient reports noticing right-sided facial droop this morning  about 2 hours prior to arrival without associated headache, syncope, chest pain or other symptoms.  Denies any extremity weakness or changes in sensation.  Reports feeling well overall and has no other complaints.  No recent fevers or illnesses.,  Including viral syndromes.  With further questioning, he does report having slightly worsening pain to his right sided V2 distribution of his face as well as the right side of his tongue.  This has been over a weeks timeframe.    Past Medical History:  Diagnosis Date  . AAA (abdominal aortic aneurysm) without rupture (HCC) 03/12/2016   Small - measured 3.3 x 3.5 cm by CTA  . CKD (chronic kidney disease) stage 3, GFR 30-59 ml/min   . Essential hypertension   . Hyperlipidemia   . Hypertensive kidney disease   . Incidental pulmonary nodule 03/12/2016   Vague opacity right lung base requires radiographic follow up - index of suspicion is LOW  . Near syncope    Cardiogenic, related to an SVT and severe MR  . Non-sustained ventricular tachycardia (HCC)   . S/P minimally invasive mitral valve repair 03/19/2016   Complex valvuloplasty including triangular resection of flail segment of posterior leaflet, chordal transposition x1, artificial Gore-tex neochord placement x4 and 34 mm Sorin Memo 3D ring annuloplasty via right mini thoracotomy approach with clipping of LA appendage  . Severe mitral regurgitation by prior echocardiogram 02/23/2016    Confirmed by TEE  . Syncope 03/15/2016  . Tennis elbow left  . Thoracic aortic aneurysm (HCC) 03/12/2016   Very very mild fusiform enlargement of distal transverse and proximal descending thoracic aorta noted on CTA  . Trigeminal neuralgia     Patient Active Problem List   Diagnosis Date Noted  . Facial droop_right 12/21/2019  . Trigeminal neuralgia   . NSVT (nonsustained ventricular tachycardia) (HCC)   . Sepsis (HCC) 10/20/2019  . ICD (implantable cardioverter-defibrillator) in place 06/17/2019  . History of ventricular tachycardia 03/18/2019  . IFG (impaired fasting glucose) 01/20/2019  . Gastroesophageal reflux disease 07/22/2018  . Advanced care planning/counseling discussion 06/11/2017  . Chronic systolic heart failure (HCC) 10/22/2016  . Nonischemic cardiomyopathy (HCC) 05/07/2016  . Encounter for therapeutic drug monitoring 04/01/2016  . S/P minimally invasive mitral valve repair 03/19/2016  . 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 CKD (chronic kidney disease), stage IIIa 03/18/2016  . Hyperlipidemia 03/18/2016  . Incidental pulmonary nodule 03/12/2016  . AAA (abdominal aortic aneurysm) without rupture (HCC) 03/12/2016  . Thoracic aortic aneurysm (HCC) 03/12/2016  . Ventricular tachycardia (HCC)   . Symptomatic PVCs 02/24/2016  . Non-rheumatic mitral regurgitation 02/23/2016  . Right hip pain 02/02/2015    Past Surgical History:  Procedure Laterality Date  . CARDIAC CATHETERIZATION N/A 02/23/2016   Procedure: Right/Left Heart Cath and Coronary Angiography;  Surgeon: [name], MD;  Location: [facility] INVASIVE CV LAB;  Service: Cardiovascular: Angiographic minimal coronary disease. Normal left and right heart Pressures. Decreased CO/CI in settng of frequent PVCs &  Severe MR.  Marland Kitchen CATARACT EXTRACTION, BILATERAL    . COLONOSCOPY  08/2005  . CYST EXCISION  10/2013   from neck  . EP IMPLANTABLE DEVICE N/A 03/27/2016    Procedure: ICD Implant Dual Chamber;  Surgeon: Marinus Maw, MD;  Location: [facility] INVASIVE CV LAB;  Service: Cardiovascular;  Laterality: N/A;  . ESOPHAGOGASTRODUODENOSCOPY  02/2010  . MITRAL VALVE REPAIR Right 03/19/2016   Procedure: MINIMALLY INVASIVE MITRAL VALVE REPAIR (MVR);  Surgeon: [name], MD;  Location: [facility] OR;  Service: Open Heart Surgery;  Laterality: Right;  . TEE WITHOUT CARDIOVERSION N/A 02/23/2016   Procedure: TRANSESOPHAGEAL ECHOCARDIOGRAM (TEE);  Surgeon: Quintella Reichert, MD;  Location: [facility] ENDOSCOPY;  Service: Cardiovascular: Normal LV size and function.  Degenerative mitral valve disease with failed posterior leaflet (P2 segment), Severe MR with pulmonary vein systolic flow reversal. Moderate-TR.    . TEE WITHOUT CARDIOVERSION N/A 03/19/2016   Procedure: TRANSESOPHAGEAL ECHOCARDIOGRAM (TEE);  Surgeon: [name], MD;  Location: [facility] OR;  Service: Open Heart Surgery;  Laterality: N/A;  . TRANSTHORACIC ECHOCARDIOGRAM  02/14/2016   EF 50-55% with moderate LVH. Possible inferolateral hypokinesis. Moderate-severe MR with posterior leaflet prolapse, cannot rule out flail. Severe LA dilation. Moderate TR.    Prior to Admission medications   Medication Sig Start Date End Date Taking? Authorizing Provider  acetaminophen (TYLENOL) 325 MG tablet Take 325-650 mg by mouth every 6 (six) hours as needed for mild pain, moderate pain or fever.    Yes [provider]  albuterol (VENTOLIN HFA) 108 (90 Base) MCG/ACT inhaler Inhale 2 puffs into the lungs every 6 (six) hours as needed for wheezing or shortness of breath. 10/24/19  Yes Marrion Coy, MD  amiodarone (PACERONE) 200 MG tablet TAKE 1 TABLET BY MOUTH EVERY DAY MONDAY THROUGH SATURDAY. DO NOT TAKE ON SUNDAY Patient taking differently: Take 200 mg by mouth See admin instructions. Take 1 tablet (200mg ) by mouth every day Monday through Saturday - do not take on Sunday 12/07/19  Yes End, 02/07/20, MD  amLODipine (NORVASC) 2.5 MG  tablet TAKE 1 TABLET(2.5 MG) BY MOUTH DAILY Patient taking differently: Take 2.5 mg by mouth daily.  11/09/19  Yes Dunn, 01/09/20, PA-C  aspirin EC 81 MG tablet Take 1 tablet (81 mg total) by mouth daily. 02/09/16  Yes End, 04/10/16, MD  guaiFENesin (MUCINEX) 600 MG 12 hr tablet Take 1 tablet (600 mg total) by mouth 2 (two) times daily. Patient taking differently: Take 600 mg by mouth 2 (two) times daily as needed for cough or to loosen phlegm.  10/24/19  Yes 10/26/19, MD  Multiple Vitamins-Minerals (MULTIVITAMIN ADULTS 50+ PO) Take 1 tablet by mouth daily.   Yes [provider]    Allergies Amiodarone and Lyrica [pregabalin]  Family History  Problem Relation Age of Onset  . Hypertension Mother   . Stroke Mother   . Heart disease Father   . Heart attack Father   . Dementia Sister   . Retinoblastoma Son   . COPD Neg Hx   . Cancer Neg Hx   . Diabetes Neg Hx     Social History Social History   Tobacco Use  . Smoking status: Never Smoker  . Smokeless tobacco: Never Used  Vaping Use  . Vaping Use: Never used  Substance Use Topics  . Alcohol use: No  . Drug use: No    Review of Systems  Constitutional: No fever/chills Eyes: No visual changes. ENT: No sore throat. Cardiovascular: Denies chest  pain. Respiratory: Denies shortness of breath. Gastrointestinal: No abdominal pain.  No nausea, no vomiting.  No diarrhea.  No constipation. Genitourinary: Negative for dysuria. Musculoskeletal: Negative for back pain. Skin: Negative for rash. Neurological: Negative for headaches, focal weakness or numbness.   ____________________________________________   PHYSICAL EXAM:  VITAL SIGNS: ED Triage Vitals  Enc Vitals Group     BP 12/21/19 1146 122/65     Pulse Rate 12/21/19 1146 64     Resp 12/21/19 1146 18     Temp 12/21/19 1502 97.6 F (36.4 C)     Temp Source 12/21/19 1502 Oral     SpO2 12/21/19 1146 98 %     Weight 12/21/19 1136 186 lb (84.4 kg)     Height  12/21/19 1136 5\' 11"  (1.803 m)     Head Circumference --      Peak Flow --      Pain Score 12/21/19 1136 0     Pain Loc --      Pain Edu? --      Excl. in GC? --     Constitutional: Alert and oriented. Well appearing and in no acute distress.  Sitting up in bed, conversational in full sentences without distress.  Pleasant and making jokes. Eyes:  PERRL. EOMI. injection of his right conjunctiva Head: Atraumatic. Nose: No congestion/rhinnorhea. Mouth/Throat: Mucous membranes are moist.  Oropharynx non-erythematous. Neck: No stridor.   Cardiovascular: Normal rate, regular rhythm. Grossly normal heart sounds.  Good peripheral circulation. Respiratory: Normal respiratory effort.  No retractions. Lungs CTAB. Gastrointestinal: Soft and nontender. No distention. No abdominal bruits. No CVA tenderness. Musculoskeletal: No lower extremity tenderness nor edema.  No joint effusions. Neurologic:  Normal speech and language. No gait instability. Right-sided facial droop is present, and seems to involve his forehead -dry and irritated right eye and poor eyelid closure. Cranial nerves are otherwise intact 5/5 strength and sensation to light touch in all 4 extremities. Skin:  Skin is warm, dry and intact. No rash noted. Psychiatric: Mood and affect are normal. Speech and behavior are normal.  ____________________________________________   LABS (all labs ordered are listed, but only abnormal results are displayed)  Labs Reviewed  CBC - Abnormal; Notable for the following components:      Result Value   Platelets 147 (*)    All other components within normal limits  COMPREHENSIVE METABOLIC PANEL - Abnormal; Notable for the following components:   Glucose, Bld 106 (*)    BUN 26 (*)    Creatinine, Ser 1.81 (*)    Calcium 8.6 (*)    GFR calc non Af Amer 33 (*)    GFR calc Af Amer 38 (*)    All other components within normal limits  GLUCOSE, CAPILLARY - Abnormal; Notable for the following  components:   Glucose-Capillary 112 (*)    All other components within normal limits  BRAIN NATRIURETIC PEPTIDE - Abnormal; Notable for the following components:   B Natriuretic Peptide 149.0 (*)    All other components within normal limits  SARS CORONAVIRUS 2 BY RT PCR (HOSPITAL ORDER, PERFORMED IN [facility])  PROTIME-INR  APTT  DIFFERENTIAL  HEMOGLOBIN A1C  LIPID PANEL  I-STAT CREATININE, ED  CBG MONITORING, ED   ____________________________________________  EKG Paced rhythm with rate of 61 bpm and normal axis.  LBBB morphology with a single PVC.  No evidence of acute ischemia or sgarbossa criteria  ____________________________________________  RADIOLOGY  ED MD interpretation: CT head without contrast obtained due  to concern for acute CVA, and without evidence of acute ICH or stroke.  Official radiology report(s): CT HEAD CODE STROKE WO CONTRAST  Result Date: 12/21/2019 CLINICAL DATA:  Code stroke.  Neuro deficit. EXAM: CT HEAD WITHOUT CONTRAST TECHNIQUE: Contiguous axial images were obtained from the base of the skull through the vertex without intravenous contrast. COMPARISON:  12/24/2007 MRI/MRA head. FINDINGS: Brain: No acute infarct or intracranial hemorrhage. Remote bilateral frontal white matter infarcts. Mild diffuse parenchymal volume loss with ex vacuo dilatation. Scattered and confluent supratentorial white matter hypodensities are nonspecific however commonly associated with chronic microvascular ischemic changes. No mass lesion. No midline shift or extra-axial fluid collection. Vascular: No hyperdense vessel. Bilateral carotid siphon atherosclerotic calcifications. Skull: Negative for fracture or focal lesion. Sinuses/Orbits: Normal orbits. Clear paranasal sinuses. No mastoid effusion. Other: None. ASPECTS [facility] Stroke Program Early CT Score) - Ganglionic level infarction (caudate, lentiform nuclei, internal capsule, insula, M1-M3 cortex): 7 -  Supraganglionic infarction (M4-M6 cortex): 3 Total score (0-10 with 10 being normal): 10 IMPRESSION: 1. No acute infarct or intracranial hemorrhage. Remote bilateral frontal white matter infarcts. 2. ASPECTS is 10 3. Mild cerebral atrophy and chronic microvascular ischemic changes. Code stroke imaging results were communicated on 12/21/2019 at 12:11 pm to provider Dr. Daryel November via telephone Electronically Signed   By: Stana Bunting M.D.   On: 12/21/2019 12:20    ____________________________________________   PROCEDURES   ____________________________________________   INITIAL IMPRESSION / ASSESSMENT AND PLAN / ED COURSE  Pleasant 86 year old gentleman with history of trigeminal neuralgia presenting with isolated right-sided facial droop most consistent with a peripheral facial nerve deficit, requiring medical admission but no need for thrombectomy or TPA.  Normal vital signs.  Exam with pleasant and joking patient with isolated right-sided facial droop that appears to be peripheral in nature, due to inclusion of the forehead and poor eyelid closure.  No evidence of additional neurologic deficits or acute pathology.  CT imaging reviewed without evidence of ICH or CVA.  Neurology rapidly assessed the patient and agrees that this is likely peripheral in nature and attributed to his trigeminal neuralgia, more so than a central cause.  Unfortunately unable to acquire MRI for more definitive diagnostic purposes, due to his cardiac device, and so we will admit the patient to medicine for further work-up and management of his facial droop syndrome.  Clinical Course as of Dec 20 1704  Tue Dec 21, 2019  1206 Discussed care with neurologist, Dr. Thad Ranger, who has assessed the patient and believes his symptoms are more related to his known trigeminal neuralgia. Unfortunately unable to get MRI due to cardiac device. Neuro recommends medical admission for echo, carotids and monitoring   [DS]     Clinical Course User Index [DS] Delton Prairie, MD     ____________________________________________   FINAL CLINICAL IMPRESSION(S) / ED DIAGNOSES  Final diagnoses:  Facial droop  Trigeminal neuralgia of right side of face     ED Discharge Orders    None       Note:  This document was prepared using Dragon voice recognition software and may include unintentional dictation errors.    Delton Prairie, MD 12/21/19 [phone]

## 2019-12-21 NOTE — Progress Notes (Signed)
CODE STROKE- PHARMACY COMMUNICATION   Time CODE STROKE called/page received:1147  Time response to CODE STROKE was made (in person or via phone): 1155  Time Stroke Kit retrieved from [employer] (only if needed):N/A  Name of Provider/Nurse contacted:Olivia, RN  Past Medical History:  Diagnosis Date  . AAA (abdominal aortic aneurysm) without rupture (HCC) 03/12/2016   Small - measured 3.3 x 3.5 cm by CTA  . CKD (chronic kidney disease) stage 3, GFR 30-59 ml/min   . Essential hypertension   . Hyperlipidemia   . Hypertensive kidney disease   . Incidental pulmonary nodule 03/12/2016   Vague opacity right lung base requires radiographic follow up - index of suspicion is LOW  . Near syncope    Cardiogenic, related to an SVT and severe MR  . Non-sustained ventricular tachycardia (HCC)   . S/P minimally invasive mitral valve repair 03/19/2016   Complex valvuloplasty including triangular resection of flail segment of posterior leaflet, chordal transposition x1, artificial Gore-tex neochord placement x4 and 34 mm Sorin Memo 3D ring annuloplasty via right mini thoracotomy approach with clipping of LA appendage  . Severe mitral regurgitation by prior echocardiogram 02/23/2016   Confirmed by TEE  . Syncope 03/15/2016  . Tennis elbow left  . Thoracic aortic aneurysm (HCC) 03/12/2016   Very very mild fusiform enlargement of distal transverse and proximal descending thoracic aorta noted on CTA  . Trigeminal neuralgia    Prior to Admission medications   Medication Sig Start Date End Date Taking? Authorizing Provider  acetaminophen (TYLENOL) 325 MG tablet Take 325 mg by mouth every 4 (four) hours as needed.    [provider]  albuterol (VENTOLIN HFA) 108 (90 Base) MCG/ACT inhaler Inhale 2 puffs into the lungs every 6 (six) hours as needed for wheezing or shortness of breath. 10/24/19   Zhang, Dekui, MD  amiodarone (PACERONE) 200 MG tablet TAKE 1 TABLET BY MOUTH EVERY DAY MONDAY THROUGH  SATURDAY. DO NOT TAKE ON SUNDAY 12/07/19   End, Christopher, MD  amLODipine (NORVASC) 2.5 MG tablet TAKE 1 TABLET(2.5 MG) BY MOUTH DAILY 11/09/19   Dunn, Ryan M, PA-C  aspirin EC 81 MG tablet Take 1 tablet (81 mg total) by mouth daily. 02/09/16   End, Christopher, MD  guaiFENesin (MUCINEX) 600 MG 12 hr tablet Take 1 tablet (600 mg total) by mouth 2 (two) times daily. 10/24/19   Zhang, Dekui, MD  Multiple Vitamins-Minerals (MULTIVITAMIN ADULTS 50+ PO) Take 1 tablet by mouth daily.    [provider]    Sheema M Hallaji, PharmD, BCPS Clinical Pharmacist 12/21/2019 12:23 PM    

## 2019-12-21 NOTE — Progress Notes (Signed)
Pt returned from ultrasound in stable condition.

## 2019-12-21 NOTE — Progress Notes (Signed)
Pt taken off unit via bed for ultrasound in stable condition

## 2019-12-21 NOTE — Progress Notes (Signed)
  Chaplain On-Call responded to pager notification for Code Stroke. Met RN Minette Brine and patient, who has requested that his son Remo Lipps be able to come to see him in the ED.  Chaplain provided brief spiritual and emotional support for patient. Chaplain also assisted RN Minette Brine in locating patient's son in the parking lot. Chaplain escorted Remo Lipps to patient's bedside, and assured them that additional Chaplaincy support is available to them after patient is admitted.  Dawson Jaeliana Lococo M.Div., [facility]

## 2019-12-21 NOTE — H&P (Signed)
History and Physical    [name] LRJ:[id number] DOB: [birth year] DOA: 12/21/2019  Referring MD/NP/PA:   PCP: Marjie Skiff, NP   Patient coming from:  The patient is coming from home.  At baseline, pt is independent for most of ADL.        Chief Complaint: Right facial droop  HPI: Roy Palmer is a 86 y.o. male with medical history significant of trigeminal neuralgia, hypertension, hyperlipidemia, GERD, AAA, thoracic aortic aneurysm, s/p of mitral valve repair for mitral valve regurgitation, NSVT, CKD-3, sCHF with EF 25-30%, ICD placement, who presents with right facial droop.  Pt states that he has tongue pain for 3 days. When he got to his doctor's appointment at about 9:30 AM, he was found to have right facial droop and sent the patient to the ED. patient does not have unilateral numbness or tingling his extremities.  No hearing loss or vision change.  Patient denies any chest pain, shortness breath, cough.  No fever or chills.  No nausea vomiting, diarrhea, abdominal pain, symptoms of UTI.  ED Course: pt was found to have WBC 6.9, pending COVID-19 PCR, INR 1.0, PTT 29, renal function close to baseline, blood pressure 122/65, heart rate 64, RR 18 oxygen saturation 98% on room air.  CT head is negative for acute intracranial abnormalities patient is placed on MedSurg bed for observation, Dr. Thad Ranger of neurology is consulted.  Review of Systems:   General: no fevers, chills, no body weight gain, has fatigue HEENT: no blurry vision, hearing changes or sore throat. Has tongue pain. Respiratory: no dyspnea, coughing, wheezing CV: no chest pain, no palpitations GI: no nausea, vomiting, abdominal pain, diarrhea, constipation GU: no dysuria, burning on urination, increased urinary frequency, hematuria  Ext: Trace leg edema Neuro: no unilateral weakness, numbness, or tingling in extremities, no vision change or hearing loss.  Has right facial droop Skin: no rash, no skin  tear. MSK: No muscle spasm, no deformity, no limitation of range of movement in spin Heme: No easy bruising.  Travel history: No recent long distant travel.  Allergy:  Allergies  Allergen Reactions  . Amiodarone Other (See Comments)    Terrible dreams/nightmares in high dosing  . Lyrica [Pregabalin] Nausea And Vomiting    Past Medical History:  Diagnosis Date  . AAA (abdominal aortic aneurysm) without rupture (HCC) 03/12/2016   Small - measured 3.3 x 3.5 cm by CTA  . CKD (chronic kidney disease) stage 3, GFR 30-59 ml/min   . Essential hypertension   . Hyperlipidemia   . Hypertensive kidney disease   . Incidental pulmonary nodule 03/12/2016   Vague opacity right lung base requires radiographic follow up - index of suspicion is LOW  . Near syncope    Cardiogenic, related to an SVT and severe MR  . Non-sustained ventricular tachycardia (HCC)   . S/P minimally invasive mitral valve repair 03/19/2016   Complex valvuloplasty including triangular resection of flail segment of posterior leaflet, chordal transposition x1, artificial Gore-tex neochord placement x4 and 34 mm Sorin Memo 3D ring annuloplasty via right mini thoracotomy approach with clipping of LA appendage  . Severe mitral regurgitation by prior echocardiogram 02/23/2016   Confirmed by TEE  . Syncope 03/15/2016  . Tennis elbow left  . Thoracic aortic aneurysm (HCC) 03/12/2016   Very very mild fusiform enlargement of distal transverse and proximal descending thoracic aorta noted on CTA  . Trigeminal neuralgia     Past Surgical History:  Procedure Laterality Date  .  CARDIAC CATHETERIZATION N/A 02/23/2016   Procedure: Right/Left Heart Cath and Coronary Angiography;  Surgeon: [name], MD;  Location[facility] INVASIVE CV LAB;  Service: Cardiovascular: Angiographic minimal coronary disease. Normal left and right heart Pressures. Decreased CO/CI in settng of frequent PVCs & Severe MR.  Marland Kitchen CATARACT EXTRACTION, BILATERAL    .  COLONOSCOPY  08/2005  . CYST EXCISION  10/2013   from neck  . EP IMPLANTABLE DEVICE N/A 03/27/2016   Procedure: ICD Implant Dual Chamber;  Surgeon: Marinus Maw, MD;  Location: [facility] INVASIVE CV LAB;  Service: Cardiovascular;  Laterality: N/A;  . ESOPHAGOGASTRODUODENOSCOPY  02/2010  . MITRAL VALVE REPAIR Right 03/19/2016   Procedure: MINIMALLY INVASIVE MITRAL VALVE REPAIR (MVR);  Surgeon: [name], MD;  Location: [facility] OR;  Service: Open Heart Surgery;  Laterality: Right;  . TEE WITHOUT CARDIOVERSION N/A 02/23/2016   Procedure: TRANSESOPHAGEAL ECHOCARDIOGRAM (TEE);  Surgeon: Quintella Reichert, MD;  Location: [facility] ENDOSCOPY;  Service: Cardiovascular: Normal LV size and function.  Degenerative mitral valve disease with failed posterior leaflet (P2 segment), Severe MR with pulmonary vein systolic flow reversal. Moderate-TR.    . TEE WITHOUT CARDIOVERSION N/A 03/19/2016   Procedure: TRANSESOPHAGEAL ECHOCARDIOGRAM (TEE);  Surgeon: [name], MD;  Location: [facility] OR;  Service: Open Heart Surgery;  Laterality: N/A;  . TRANSTHORACIC ECHOCARDIOGRAM  02/14/2016   EF 50-55% with moderate LVH. Possible inferolateral hypokinesis. Moderate-severe MR with posterior leaflet prolapse, cannot rule out flail. Severe LA dilation. Moderate TR.    Social History:  reports that he has never smoked. He has never used smokeless tobacco. He reports that he does not drink alcohol and does not use drugs.  Family History:  Family History  Problem Relation Age of Onset  . Hypertension Mother   . Stroke Mother   . Heart disease Father   . Heart attack Father   . Dementia Sister   . Retinoblastoma Son   . COPD Neg Hx   . Cancer Neg Hx   . Diabetes Neg Hx      Prior to Admission medications   Medication Sig Start Date End Date Taking? Authorizing Provider  acetaminophen (TYLENOL) 325 MG tablet Take 325 mg by mouth every 4 (four) hours as needed.    [provider]  albuterol (VENTOLIN HFA) 108 (90 Base)  MCG/ACT inhaler Inhale 2 puffs into the lungs every 6 (six) hours as needed for wheezing or shortness of breath. 10/24/19   Marrion Coy, MD  amiodarone (PACERONE) 200 MG tablet TAKE 1 TABLET BY MOUTH EVERY DAY MONDAY THROUGH SATURDAY. DO NOT TAKE ON SUNDAY 12/07/19   End, Cristal Deer, MD  amLODipine (NORVASC) 2.5 MG tablet TAKE 1 TABLET(2.5 MG) BY MOUTH DAILY 11/09/19   Sondra Barges, PA-C  aspirin EC 81 MG tablet Take 1 tablet (81 mg total) by mouth daily. 02/09/16   End, Cristal Deer, MD  guaiFENesin (MUCINEX) 600 MG 12 hr tablet Take 1 tablet (600 mg total) by mouth 2 (two) times daily. 10/24/19   Marrion Coy, MD  Multiple Vitamins-Minerals (MULTIVITAMIN ADULTS 50+ PO) Take 1 tablet by mouth daily.    [provider]    Physical Exam: Vitals:   12/21/19 1330 12/21/19 1400 12/21/19 1430 12/21/19 1502  BP: 133/70 119/74 125/65 130/76  Pulse: 62 63 67 64  Resp: 18 16 (!) 23 15  Temp:    97.6 F (36.4 C)  TempSrc:    Oral  SpO2: 97% 96% 100% 99%  Weight:  Height:       General: Not in acute distress HEENT:       Eyes: PERRL, EOMI, no scleral icterus.       ENT: No discharge from the ears and nose, no pharynx injection, no tonsillar enlargement.        Neck: No JVD, no bruit, no mass felt. Heme: No neck lymph node enlargement. Cardiac: S1/S2, RRR, 1/6 murmurs, No gallops or rubs. Respiratory: No rales, wheezing, rhonchi or rubs. GI: Soft, nondistended, nontender, no rebound pain, no organomegaly, BS present. GU: No hematuria Ext: Has trace leg edema bilaterally. 1+DP/PT pulse bilaterally. Musculoskeletal: No joint deformities, No joint redness or warmth, no limitation of ROM in spin. Skin: No rashes.  Neuro: Alert, oriented X3, cranial nerves II-XII grossly intact except for right facial droop, moves all extremities normally. Muscle strength 5/5 in all extremities, sensation to light touch intact. Brachial reflex 2+ bilaterally.  Psych: Patient is not psychotic, no suicidal or  hemocidal ideation.  Labs on Admission: I have personally reviewed following labs and imaging studies  CBC: Recent Labs  Lab 12/21/19 1203  WBC 6.9  NEUTROABS 4.9  HGB 14.7  HCT 44.7  MCV 96.3  PLT 147*   Basic Metabolic Panel: Recent Labs  Lab 12/21/19 1203  NA 137  K 4.7  CL 102  CO2 27  GLUCOSE 106*  BUN 26*  CREATININE 1.81*  CALCIUM 8.6*   GFR: Estimated Creatinine Clearance: 30.6 mL/min (A) (by C-G formula based on SCr of 1.81 mg/dL (H)). Liver Function Tests: Recent Labs  Lab 12/21/19 1203  AST 17  ALT 18  ALKPHOS 48  BILITOT 0.8  PROT 6.8  ALBUMIN 3.9   No results for input(s): LIPASE, AMYLASE in the last 168 hours. No results for input(s): AMMONIA in the last 168 hours. Coagulation Profile: Recent Labs  Lab 12/21/19 1203  INR 1.0   Cardiac Enzymes: No results for input(s): CKTOTAL, CKMB, CKMBINDEX, TROPONINI in the last 168 hours. BNP (last 3 results) No results for input(s): PROBNP in the last 8760 hours. HbA1C: No results for input(s): HGBA1C in the last 72 hours. CBG: Recent Labs  Lab 12/21/19 1145  GLUCAP 112*   Lipid Profile: No results for input(s): CHOL, HDL, LDLCALC, TRIG, CHOLHDL, LDLDIRECT in the last 72 hours. Thyroid Function Tests: No results for input(s): TSH, T4TOTAL, FREET4, T3FREE, THYROIDAB in the last 72 hours. Anemia Panel: No results for input(s): VITAMINB12, FOLATE, FERRITIN, TIBC, IRON, RETICCTPCT in the last 72 hours. Urine analysis:    Component Value Date/Time   COLORURINE YELLOW (A) 10/20/2019 2002   APPEARANCEUR CLEAR (A) 10/20/2019 2002   APPEARANCEUR Clear 07/30/2016 1106   LABSPEC 1.011 10/20/2019 2002   PHURINE 5.0 10/20/2019 2002   GLUCOSEU NEGATIVE 10/20/2019 2002   HGBUR SMALL (A) 10/20/2019 2002   BILIRUBINUR NEGATIVE 10/20/2019 2002   BILIRUBINUR Negative 07/30/2016 1106   KETONESUR NEGATIVE 10/20/2019 2002   PROTEINUR NEGATIVE 10/20/2019 2002   NITRITE NEGATIVE 10/20/2019 2002    LEUKOCYTESUR NEGATIVE 10/20/2019 2002   Sepsis Labs: @LABRCNTIP (procalcitonin:4,lacticidven:4) ) Recent Results (from the past 240 hour(s))  SARS Coronavirus 2 by RT PCR (hospital order, performed in [facility] hospital lab) Nasopharyngeal Nasopharyngeal Swab     Status: None   Collection Time: 12/21/19 12:03 PM   Specimen: Nasopharyngeal Swab  Result Value Ref Range Status   SARS Coronavirus 2 NEGATIVE NEGATIVE Final    Comment: (NOTE) SARS-CoV-2 target nucleic acids are NOT DETECTED.  The SARS-CoV-2 RNA is generally detectable  in upper and lower respiratory specimens during the acute phase of infection. The lowest concentration of SARS-CoV-2 viral copies this assay can detect is 250 copies / mL. A negative result does not preclude SARS-CoV-2 infection and should not be used as the sole basis for treatment or other patient management decisions.  A negative result may occur with improper specimen collection / handling, submission of specimen other than nasopharyngeal swab, presence of viral mutation(s) within the areas targeted by this assay, and inadequate number of viral copies (<250 copies / mL). A negative result must be combined with clinical observations, patient history, and epidemiological information.  Fact Sheet for Patients:   BoilerBrush.com.cy  Fact Sheet for Healthcare Providers: https://pope.com/  This test is not yet approved or  cleared by the [place] FDA and has been authorized for detection and/or diagnosis of SARS-CoV-2 by FDA under an Emergency Use Authorization (EUA).  This EUA will remain in effect (meaning this test can be used) for the duration of the COVID-19 declaration under Section 564(b)(1) of the Act, 21 U.S.C. section 360bbb-3(b)(1), unless the authorization is terminated or revoked sooner.  Performed at [facility], [address] [place], [place] [address]      Radiological  Exams on Admission: CUP PACEART REMOTE DEVICE CHECK  Result Date: 12/21/2019 Scheduled remote reviewed. Normal device function.  Next remote 91 days.  CT HEAD CODE STROKE WO CONTRAST  Result Date: 12/21/2019 CLINICAL DATA:  Code stroke.  Neuro deficit. EXAM: CT HEAD WITHOUT CONTRAST TECHNIQUE: Contiguous axial images were obtained from the base of the skull through the vertex without intravenous contrast. COMPARISON:  12/24/2007 MRI/MRA head. FINDINGS: Brain: No acute infarct or intracranial hemorrhage. Remote bilateral frontal white matter infarcts. Mild diffuse parenchymal volume loss with ex vacuo dilatation. Scattered and confluent supratentorial white matter hypodensities are nonspecific however commonly associated with chronic microvascular ischemic changes. No mass lesion. No midline shift or extra-axial fluid collection. Vascular: No hyperdense vessel. Bilateral carotid siphon atherosclerotic calcifications. Skull: Negative for fracture or focal lesion. Sinuses/Orbits: Normal orbits. Clear paranasal sinuses. No mastoid effusion. Other: None. ASPECTS [facility] Stroke Program Early CT Score) - Ganglionic level infarction (caudate, lentiform nuclei, internal capsule, insula, M1-M3 cortex): 7 - Supraganglionic infarction (M4-M6 cortex): 3 Total score (0-10 with 10 being normal): 10 IMPRESSION: 1. No acute infarct or intracranial hemorrhage. Remote bilateral frontal white matter infarcts. 2. ASPECTS is 10 3. Mild cerebral atrophy and chronic microvascular ischemic changes. Code stroke imaging results were communicated on 12/21/2019 at 12:11 pm to provider Dr. Daryel November via telephone Electronically Signed   By: Stana Bunting M.D.   On: 12/21/2019 12:20     EKG: Independently reviewed.  Sinus rhythm, QTC 523, seems to be paced rhythm, widened QRS, poor R wave progression, anteroseptal infarction pattern, LAD.  Assessment/Plan Principal Problem:   Facial droop_right Active Problems:    CKD (chronic kidney disease), stage IIIa   Hyperlipidemia   Chronic systolic heart failure (HCC)   Trigeminal neuralgia   NSVT (nonsustained ventricular tachycardia) (HCC)   HTN (hypertension)   Facial droop_right: Per Dr. Thad Ranger of neurology, patient's symptoms appear peripheral, but patient has multiple vascular risk factors and cannot do MRI due to presence of ICD. She recommended to get 2D echo and carotid artery Doppler.  -Placed on MedSurg bed for observation - will follow up Neurology's Recs.  - will repeat CT-head in AM per neruo - Check carotid dopplers  - ASA  - fasting lipid panel and HbA1c  -  2D transthoracic echocardiography  - swallowing screen. If fails, will get SLP - PT/OT consult  CKD (chronic kidney disease), stage IIIa: Recent creatinine 1.4-1.7 at baseline, his creatinine is 1.81, BUN 26, slightly worsened than the baseline -Follow-up by BMP -Avoid using liver toxic medications  Hyperlipidemia: Patient is not taking medication at home -Follow-up lipid panel  Chronic systolic heart failure (HCC): 2D echo 04/25/2017 showed EF 25-30%.  Patient has trace leg edema, but no DVT.  No respiratory distress.  CHF seem to be compensated.  Patient is not taking diuretics at home -Check BNP  Trigeminal neuralgia: has some tongue pain -Observe closely -prn tylenol  NSVT (nonsustained ventricular tachycardia) (HCC) -Continue amiodarone  Hypertension: -Continue amlodipine     DVT ppx: sQ Lovenox Code Status: Full code Family Communication: not done, no family member is at bed side.    Disposition Plan:  Anticipate discharge back to previous environment Consults called: Dr. Thad Ranger of neurology Admission status: Med-surg bed for obs   Status is: Observation  The patient remains OBS appropriate and will d/c before 2 midnights.  Dispo: The patient is from: Home              Anticipated d/c is to: Home              Anticipated d/c date is: 1 day               Patient currently is not medically stable to d/c.           Date of Service 12/21/2019    Lorretta Harp [facility]   If 7PM-7AM, please contact night-coverage [email or web address] 12/21/2019, 6:15 PM

## 2019-12-21 NOTE — ED Triage Notes (Signed)
Pt sent from [facility] for right sided facial droop that started around 930am-1030, states it was normal when he got up to shave this morning, states he was coming to the clinic for dry mouth and feeling like his tongue was swollen, states he thought it was related to trigeminal nerve pain he has had in the past. Denies any numbness. States he didn't really notice the drooping until he got to the clinic about an hour PTA

## 2019-12-21 NOTE — Consult Note (Signed)
Requesting Physician: Katrinka Blazing    Chief Complaint: Right facial droop  I have been asked by Dr. Katrinka Blazing to see this patient in consultation for code stroke.  HPI: Roy Palmer is an 86 y.o. male with a history of multiple medical problems including trigeminal neuralgia, MVR, AAA, HTN, HLD and s/p pacer placement who presents with right facial droop.  Patient reports that his tongue has been painful for about 3 days.  Today awakened otherwise at baseline.  When he got to his doctor's appointment they noticed a facial droop and sent the patient to the ED.  Initial NIHSS of 1.    Date last known well: 12/21/2019 Time last known well: Time: 09:30 tPA Given: No: Minimal findings  Past Medical History:  Diagnosis Date  . AAA (abdominal aortic aneurysm) without rupture (HCC) 03/12/2016   Small - measured 3.3 x 3.5 cm by CTA  . CKD (chronic kidney disease) stage 3, GFR 30-59 ml/min   . Essential hypertension   . Hyperlipidemia   . Hypertensive kidney disease   . Incidental pulmonary nodule 03/12/2016   Vague opacity right lung base requires radiographic follow up - index of suspicion is LOW  . Near syncope    Cardiogenic, related to an SVT and severe MR  . Non-sustained ventricular tachycardia (HCC)   . S/P minimally invasive mitral valve repair 03/19/2016   Complex valvuloplasty including triangular resection of flail segment of posterior leaflet, chordal transposition x1, artificial Gore-tex neochord placement x4 and 34 mm Sorin Memo 3D ring annuloplasty via right mini thoracotomy approach with clipping of LA appendage  . Severe mitral regurgitation by prior echocardiogram 02/23/2016   Confirmed by TEE  . Syncope 03/15/2016  . Tennis elbow left  . Thoracic aortic aneurysm (HCC) 03/12/2016   Very very mild fusiform enlargement of distal transverse and proximal descending thoracic aorta noted on CTA  . Trigeminal neuralgia     Past Surgical History:  Procedure Laterality Date  . CARDIAC  CATHETERIZATION N/A 02/23/2016   Procedure: Right/Left Heart Cath and Coronary Angiography;  Surgeon: [name], MD;  Location: [facility] INVASIVE CV LAB;  Service: Cardiovascular: Angiographic minimal coronary disease. Normal left and right heart Pressures. Decreased CO/CI in settng of frequent PVCs & Severe MR.  Marland Kitchen CATARACT EXTRACTION, BILATERAL    . COLONOSCOPY  08/2005  . CYST EXCISION  10/2013   from neck  . EP IMPLANTABLE DEVICE N/A 03/27/2016   Procedure: ICD Implant Dual Chamber;  Surgeon: Marinus Maw, MD;  Location: [facility] INVASIVE CV LAB;  Service: Cardiovascular;  Laterality: N/A;  . ESOPHAGOGASTRODUODENOSCOPY  02/2010  . MITRAL VALVE REPAIR Right 03/19/2016   Procedure: MINIMALLY INVASIVE MITRAL VALVE REPAIR (MVR);  Surgeon: [name], MD;  Location: [facility] OR;  Service: Open Heart Surgery;  Laterality: Right;  . TEE WITHOUT CARDIOVERSION N/A 02/23/2016   Procedure: TRANSESOPHAGEAL ECHOCARDIOGRAM (TEE);  Surgeon: Quintella Reichert, MD;  Location: [facility] ENDOSCOPY;  Service: Cardiovascular: Normal LV size and function.  Degenerative mitral valve disease with failed posterior leaflet (P2 segment), Severe MR with pulmonary vein systolic flow reversal. Moderate-TR.    . TEE WITHOUT CARDIOVERSION N/A 03/19/2016   Procedure: TRANSESOPHAGEAL ECHOCARDIOGRAM (TEE);  Surgeon: [name], MD;  Location: [facility] OR;  Service: Open Heart Surgery;  Laterality: N/A;  . TRANSTHORACIC ECHOCARDIOGRAM  02/14/2016   EF 50-55% with moderate LVH. Possible inferolateral hypokinesis. Moderate-severe MR with posterior leaflet prolapse, cannot rule out flail. Severe LA dilation. Moderate TR.    Family History  Problem Relation Age of Onset  . Hypertension Mother   . Stroke Mother   . Heart disease Father   . Heart attack Father   . Dementia Sister   . Retinoblastoma Son   . COPD Neg Hx   . Cancer Neg Hx   . Diabetes Neg Hx    Social History:  reports that he has never smoked. He has never used smokeless  tobacco. He reports that he does not drink alcohol and does not use drugs.  Allergies:  Allergies  Allergen Reactions  . Amiodarone Other (See Comments)    Terrible dreams/nightmares in high dosing  . Lyrica [Pregabalin] Nausea And Vomiting    Medications: I have reviewed the patient's current medications. Prior to Admission medications   Medication Sig Start Date End Date Taking? Authorizing Provider  acetaminophen (TYLENOL) 325 MG tablet Take 325 mg by mouth every 4 (four) hours as needed.    [provider]  albuterol (VENTOLIN HFA) 108 (90 Base) MCG/ACT inhaler Inhale 2 puffs into the lungs every 6 (six) hours as needed for wheezing or shortness of breath. 10/24/19   Marrion Coy, MD  amiodarone (PACERONE) 200 MG tablet TAKE 1 TABLET BY MOUTH EVERY DAY MONDAY THROUGH SATURDAY. DO NOT TAKE ON SUNDAY 12/07/19   End, Cristal Deer, MD  amLODipine (NORVASC) 2.5 MG tablet TAKE 1 TABLET(2.5 MG) BY MOUTH DAILY 11/09/19   Sondra Barges, PA-C  aspirin EC 81 MG tablet Take 1 tablet (81 mg total) by mouth daily. 02/09/16   End, Cristal Deer, MD  guaiFENesin (MUCINEX) 600 MG 12 hr tablet Take 1 tablet (600 mg total) by mouth 2 (two) times daily. 10/24/19   Marrion Coy, MD  Multiple Vitamins-Minerals (MULTIVITAMIN ADULTS 50+ PO) Take 1 tablet by mouth daily.    [provider]    ROS: History obtained from the patient  General ROS: negative for - chills, fatigue, fever, night sweats, weight gain or weight loss Psychological ROS: negative for - behavioral disorder, hallucinations, memory difficulties, mood swings or suicidal ideation Ophthalmic ROS: negative for - blurry vision, double vision, eye pain or loss of vision ENT ROS: dry mouth, tongue pain Allergy and Immunology ROS: negative for - hives or itchy/watery eyes Hematological and Lymphatic ROS: negative for - bleeding problems, bruising or swollen lymph nodes Endocrine ROS: negative for - galactorrhea, hair pattern changes,  polydipsia/polyuria or temperature intolerance Respiratory ROS: negative for - cough, hemoptysis, shortness of breath or wheezing Cardiovascular ROS: negative for - chest pain, dyspnea on exertion, edema or irregular heartbeat Gastrointestinal ROS: negative for - abdominal pain, diarrhea, hematemesis, nausea/vomiting or stool incontinence Genito-Urinary ROS: negative for - dysuria, hematuria, incontinence or urinary frequency/urgency Musculoskeletal ROS: negative for - joint swelling or muscular weakness Neurological ROS: as noted in HPI Dermatological ROS: negative for rash and skin lesion changes  Physical Examination: Blood pressure 122/65, pulse 64, resp. rate 18, height 5\' 11"  (1.803 m), weight 84.4 kg, SpO2 98 %.  HEENT-  Normocephalic, no lesions, without obvious abnormality.  Erythematous right conjunctiva.  Normal TM's bilaterally.  Normal external nose, mucus membranes and septum.  Normal pharynx. Cardiovascular- S1, S2 normal, pulses palpable throughout   Lungs- chest clear, no wheezing, rales, normal symmetric air entry Abdomen- soft, non-tender; bowel sounds normal; no masses,  no organomegaly Extremities- no edema Lymph-no adenopathy palpable Musculoskeletal-no joint tenderness, deformity or swelling Skin-warm and dry, no hyperpigmentation, vitiligo, or suspicious lesions  Neurological Examination   Mental Status: Alert, oriented, thought content appropriate.  Speech fluent  without evidence of aphasia.  Able to follow 3 step commands without difficulty. Cranial Nerves: II: Discs flat bilaterally; Visual fields grossly normal, pupils equal, round, reactive to light and accommodation III,IV, VI: ptosis not present, extra-ocular motions intact bilaterally V,VII: right facial droop, facial light touch sensation normal bilaterally.  Decreased right eye closure VIII: hearing normal bilaterally IX,X: gag reflex present XI: bilateral shoulder shrug XII: midline tongue  extension Motor: Right : Upper extremity   5/5    Left:     Upper extremity   5/5  Lower extremity   5/5     Lower extremity   5/5 Tone and bulk:normal tone throughout; no atrophy noted Sensory: Pinprick and light touch intact throughout, bilaterally Deep Tendon Reflexes: Symmetric throughout Plantars: Right: mute   Left: mute Cerebellar: Normal finger-to-nose and normal heel-to-shin testing bilaterally Gait: not tested due to safety concerns    Laboratory Studies:  Basic Metabolic Panel: Recent Labs  Lab 12/21/19 1203  NA 137  K 4.7  CL 102  CO2 27  GLUCOSE 106*  BUN 26*  CREATININE 1.81*  CALCIUM 8.6*    Liver Function Tests: Recent Labs  Lab 12/21/19 1203  AST 17  ALT 18  ALKPHOS 48  BILITOT 0.8  PROT 6.8  ALBUMIN 3.9   No results for input(s): LIPASE, AMYLASE in the last 168 hours. No results for input(s): AMMONIA in the last 168 hours.  CBC: Recent Labs  Lab 12/21/19 1203  WBC 6.9  NEUTROABS 4.9  HGB 14.7  HCT 44.7  MCV 96.3  PLT 147*    Cardiac Enzymes: No results for input(s): CKTOTAL, CKMB, CKMBINDEX, TROPONINI in the last 168 hours.  BNP: Invalid input(s): POCBNP  CBG: Recent Labs  Lab 12/21/19 1145  GLUCAP 112*    Microbiology: Results for orders placed or performed during the hospital encounter of 10/20/19  SARS Coronavirus 2 by RT PCR (hospital order, performed in [facility] hospital lab) Nasopharyngeal Nasopharyngeal Swab     Status: None   Collection Time: 10/20/19  8:02 PM   Specimen: Nasopharyngeal Swab  Result Value Ref Range Status   SARS Coronavirus 2 NEGATIVE NEGATIVE Final    Comment: (NOTE) SARS-CoV-2 target nucleic acids are NOT DETECTED. The SARS-CoV-2 RNA is generally detectable in upper and lower respiratory specimens during the acute phase of infection. The lowest concentration of SARS-CoV-2 viral copies this assay can detect is 250 copies / mL. A negative result does not preclude SARS-CoV-2 infection and  should not be used as the sole basis for treatment or other patient management decisions.  A negative result may occur with improper specimen collection / handling, submission of specimen other than nasopharyngeal swab, presence of viral mutation(s) within the areas targeted by this assay, and inadequate number of viral copies (<250 copies / mL). A negative result must be combined with clinical observations, patient history, and epidemiological information. Fact Sheet for Patients:   BoilerBrush.com.cy Fact Sheet for Healthcare Providers: https://pope.com/ This test is not yet approved or cleared  by the [place] FDA and has been authorized for detection and/or diagnosis of SARS-CoV-2 by FDA under an Emergency Use Authorization (EUA).  This EUA will remain in effect (meaning this test can be used) for the duration of the COVID-19 declaration under Section 564(b)(1) of the Act, 21 U.S.C. section 360bbb-3(b)(1), unless the authorization is terminated or revoked sooner. Performed at [facility], [address] [place], [place] [address]   Urine culture     Status: Abnormal  Collection Time: 10/20/19  8:02 PM   Specimen: In/Out Cath Urine  Result Value Ref Range Status   Specimen Description   Final    IN/OUT CATH URINE Performed at [facility], [address] [place], [place] [address]    Special Requests   Final    NONE Performed at [facility], [address] [place], [place] [address]    Culture MULTIPLE SPECIES PRESENT, SUGGEST RECOLLECTION (A)  Final   Report Status 10/23/2019 FINAL  Final  Culture, blood (routine x 2)     Status: None   Collection Time: 10/20/19  8:03 PM   Specimen: BLOOD  Result Value Ref Range Status   Specimen Description BLOOD BLOOD RIGHT FOREARM  Final   Special Requests   Final    BOTTLES DRAWN AEROBIC AND ANAEROBIC Blood Culture adequate volume   Culture    Final    NO GROWTH 5 DAYS Performed at [facility], [address] [place], [place] [address]    Report Status 10/25/2019 FINAL  Final  Culture, blood (routine x 2)     Status: None   Collection Time: 10/20/19  8:03 PM   Specimen: BLOOD  Result Value Ref Range Status   Specimen Description BLOOD RIGHT ANTECUBITAL  Final   Special Requests   Final    BOTTLES DRAWN AEROBIC AND ANAEROBIC Blood Culture adequate volume   Culture   Final    NO GROWTH 5 DAYS Performed at [facility], [address] [place], [place] [address]    Report Status 10/25/2019 FINAL  Final    Coagulation Studies: Recent Labs    12/21/19 1203  LABPROT 12.9  INR 1.0    Urinalysis: No results for input(s): COLORURINE, LABSPEC, PHURINE, GLUCOSEU, HGBUR, BILIRUBINUR, KETONESUR, PROTEINUR, UROBILINOGEN, NITRITE, LEUKOCYTESUR in the last 168 hours.  Invalid input(s): APPERANCEUR  Lipid Panel:    Component Value Date/Time   CHOL 188 07/29/2019 1328   TRIG 320 (H) 07/29/2019 1328   HDL 40 01/20/2019 1308   CHOLHDL 4.1 04/26/2017 0529   VLDL 64 (H) 07/29/2019 1328   LDLCALC 112 (H) 01/20/2019 1308    HgbA1C:  Lab Results  Component Value Date   HGBA1C 5.8 01/20/2019    Urine Drug Screen:  No results found for: LABOPIA, COCAINSCRNUR, LABBENZ, AMPHETMU, THCU, LABBARB  Alcohol Level: No results for input(s): ETH in the last 168 hours.  Other results: EKG: sinus rhythm at 61 bpm.  Imaging: CUP PACEART REMOTE DEVICE CHECK  Result Date: 12/21/2019 Scheduled remote reviewed. Normal device function.  Next remote 91 days.   Assessment: 86 y.o. male with a history of multiple medical problems including trigeminal neuralgia, MVR, AAA, HTN, HLD and s/p pacer placement who presents with right facial droop.  Patient reports that his tongue has been painful for about 3 days.  Today awakened otherwise at baseline.  When he got to his doctor's appointment they noticed a facial droop and sent  the patient to the ED.  Although symptoms appear peripheral on examination, patient with multiple vascular risk factors.  Unable to perform MRI of the brain to rule out infarct due to pacemaker therefore further observation indicated.    Stroke Risk Factors - hyperlipidemia and hypertension  Plan: 1. HgbA1c, fasting lipid panel 2. Repeat head CT in AM 3. Echocardiogram 4. Carotid dopplers 5. Continue ASA 6. NPO until RN stroke swallow screen 7. Telemetry monitoring 8. Frequent neuro checks    Thana Farr, MD Neurology [phone] 12/21/2019,  12:53 PM

## 2019-12-22 ENCOUNTER — Observation Stay: Payer: Medicare Other

## 2019-12-22 DIAGNOSIS — I5022 Chronic systolic (congestive) heart failure: Secondary | ICD-10-CM | POA: Diagnosis not present

## 2019-12-22 LAB — ECHOCARDIOGRAM COMPLETE
Area-P 1/2: 3.03 cm2
Height: 71 in
S' Lateral: 3.74 cm
Single Plane A4C EF: 55.8 %
Weight: 2976 oz

## 2019-12-22 LAB — LIPID PANEL
Cholesterol: 225 mg/dL — ABNORMAL HIGH (ref 0–200)
HDL: 41 mg/dL (ref >40)
LDL Cholesterol: 151 mg/dL — ABNORMAL HIGH (ref 0–99)
Total CHOL/HDL Ratio: 5.5 RATIO
Triglycerides: 165 mg/dL — ABNORMAL HIGH (ref <150)
VLDL: 33 mg/dL (ref 0–40)

## 2019-12-22 LAB — HEMOGLOBIN A1C
Hgb A1c MFr Bld: 6.1 % — ABNORMAL HIGH (ref 4.8–5.6)
Mean Plasma Glucose: 128.37 mg/dL

## 2019-12-22 MED ORDER — ATORVASTATIN CALCIUM 40 MG PO TABS: 40.0000 mg | ORAL_TABLET | Freq: Every day | ORAL | 3 refills | Status: DC

## 2019-12-22 MED ORDER — FLUCONAZOLE 100 MG PO TABS
100.0000 mg | ORAL_TABLET | Freq: Once | ORAL | Status: AC
  Administered 2019-12-22: 12:00:00 100 mg via ORAL
  Filled 2019-12-22: qty 1

## 2019-12-22 MED ORDER — NYSTATIN 100000 UNIT/ML MT SUSP: 5.0000 mL | Freq: Four times a day (QID) | OROMUCOSAL | 0 refills | Status: DC

## 2019-12-22 MED ORDER — NYSTATIN 100000 UNIT/ML MT SUSP
5.0000 mL | Freq: Four times a day (QID) | OROMUCOSAL | Status: DC
  Administered 2019-12-22 (×2): 500000 [IU] via ORAL
  Filled 2019-12-22 (×2): qty 5

## 2019-12-22 NOTE — Progress Notes (Signed)
Per MD Nystatin instructions to patient should be swish and swallow.

## 2019-12-22 NOTE — TOC Progression Note (Signed)
Transition of Care [facility]) - Progression Note    Patient Details  Name: Roy Palmer MRN: [id number] Date of Birth: 11/24/1929  Transition of [facility]) CM/SW Contact  Phong Isenberg, Gardiner Rhyme, LCSW Phone Number: 12/22/2019, 3:25 PM  Clinical Narrative:  Met with pt to discuss OPPT recommendation he feels he is back to baseline and does not need OP. He has all needed equipment and can take care of himself. He lives alone and has a son who will check on him. He feels ready to go home today if MD will let him. He drives and has a PCP.         Expected Discharge Plan and Services                                                 Social Determinants of Health (SDOH) Interventions    Readmission Risk Interventions No flowsheet data found.

## 2019-12-22 NOTE — Progress Notes (Signed)
Remote ICD transmission.   

## 2019-12-22 NOTE — Progress Notes (Signed)
OT Cancellation Note  Patient Details Name: Roy Palmer MRN: [id number] DOB: [birth year]   Cancelled Treatment:    Reason Eval/Treat Not Completed: OT screened, no needs identified, will sign off. Consult received, chart reviewed. Pt demonstrates baseline functional independence, no functional deficits appreciated. Will sign off. Please re-consult if additional needs arise. Thank you.  Richrd Prime, MPH, MS, OTR/L ascom [phone] 12/22/19, 11:36 AM

## 2019-12-22 NOTE — Progress Notes (Signed)
SLP Cancellation Note  Patient Details Name: Roy Palmer MRN: [id number] DOB: March 15, 1930   Cancelled treatment:       Reason Eval/Treat Not Completed: SLP screened, no needs identified, will sign off (chart reviewed; consulted pt, NSG. ). NSG to reconsult if any new needs while admitted.     Jerilynn Som, MS, CCC-SLP [place] 12/22/2019, 4:08 PM

## 2019-12-22 NOTE — Evaluation (Signed)
Physical Therapy Evaluation Patient Details Name: Roy Palmer MRN: [id number] DOB: [birth year] Today's Date: 12/22/2019   History of Present Illness  Pt is a 86 y.o. male with medical history significant of trigeminal neuralgia, hypertension, hyperlipidemia, GERD, AAA, thoracic aortic aneurysm, s/p of mitral valve repair for mitral valve regurgitation, NSVT, CKD-3, sCHF with EF 25-30%, and ICD placement. Per MD impression, pt currently presents with R facial droop (likely peripheral - CN VII), CKD, hyperlipidemia, sCHF, and trigeminal neuralgia with tongue pain.  Clinical Impression  Pt pleasant and motivated to participate during the session. A full neuro screen was performed during today's session due to pt presentation including facial droop. Pt BUE and BLE sensation, strength, coordination, and proprioception all appeared WNL.Facial sensation symmetrical. No apparent visual tracking or visual field deficits noted. Pt demonstrated independence with bed mobility and transfers and CGA with ambulation and stairs. Pt demonstrated good balance standing feet apart. Slight postural sway noted when pt stood in rhomberg position EO; however, with EC a significant  postural sway and LOB was noted primarily in the posterior direction indicating an increased reliance on visual input. Pt only able to maintain SLS bilaterally ~5sec and could not maintain tandem stance longer than ~1sec. Pt was naturally observed to stand and ambulate with a large BOS, likely due to noted balance deficits. Pt ambulated ~169ft and ascend/descended 7 stairs with no AD and precautionary CGA; no LOB noted. Discussed findings with pt and encouraged use of AD and lights on for nighttime ambulation. Pt will benefit from outpatient PT services upon discharge to safely address deficits listed in patient problem list for decreased caregiver assistance and eventual return to PLOF.     Follow Up Recommendations Outpatient PT    Equipment  Recommendations  None recommended by PT    Recommendations for Other Services       Precautions / Restrictions Precautions Precautions: None Restrictions Weight Bearing Restrictions: No      Mobility  Bed Mobility Overal bed mobility: Independent                Transfers Overall transfer level: Independent Equipment used: None                Ambulation/Gait Ambulation/Gait assistance: Min guard Gait Distance (Feet): 150 Feet Assistive device: None Gait Pattern/deviations: Step-through pattern;Wide base of support Gait velocity: normal   General Gait Details: CGA provided as precaution, pt overall appeared independent with ambulation w/ no AD. Pt noted to walk with a slightly wide BOS, likely due to balance deficits noted in balance assessment.  Stairs Stairs: Yes Stairs assistance: Min guard Stair Management: One rail Right;Alternating pattern Number of Stairs: 7 General stair comments: CGA provided as precaution. Pt was able to ascend/descend ~7 stairs, with R rail going up, utilized alternating stepping pattern. Pt noted that he usually has a harder time descending stairs but no LOB was noted during today's session  Wheelchair Mobility    Modified Rankin (Stroke Patients Only)       Balance Overall balance assessment: Needs assistance Sitting-balance support: Feet unsupported;No upper extremity supported Sitting balance-Leahy Scale: Good     Standing balance support: No upper extremity supported;During functional activity Standing balance-Leahy Scale: Fair Standing balance comment: Pt demonstrated good balance standing with feet apat. Slight postural sway noted in rhomberg position with EO, and severe postural sway (primarily posteriorly) noted with EC. Pt able to maintain SLS bilaterally for ~5sec. Pt unable to maintain tandem stance bilaterally longer than 1 second. Pt  naturally stands and ambulates with a large BOS, likely due to balance deficits  observed in functional tests.                             Pertinent Vitals/Pain Pain Assessment: No/denies pain    Home Living Family/patient expects to be discharged to:: Private residence Living Arrangements: Spouse/significant other Available Help at Discharge: Family;Available 24 hours/day Type of Home: House Home Access: Stairs to enter Entrance Stairs-Rails: Right Entrance Stairs-Number of Steps: 3 Home Layout: Able to live on main level with bedroom/bathroom Home Equipment: Walker - 2 wheels;Bedside commode;Walker - 4 wheels Additional Comments: pt reports home equipment is for wife and that he does not use    Prior Function Level of Independence: Independent         Comments: reports no falls recently however decreased balance noted     Hand Dominance   Dominant Hand: Right    Extremity/Trunk Assessment   Upper Extremity Assessment Upper Extremity Assessment: RUE deficits/detail;LUE deficits/detail RUE Deficits / Details: UE strength globally WFL RUE Sensation: WNL RUE Coordination: WNL LUE Deficits / Details: UE strength globally WFL LUE Sensation: WNL LUE Coordination: WNL    Lower Extremity Assessment Lower Extremity Assessment: RLE deficits/detail;LLE deficits/detail RLE Deficits / Details: LE strength globally WFL RLE Sensation: WNL RLE Coordination: WNL LLE Deficits / Details: LE strength globally WFL LLE Sensation: WNL LLE Coordination: WNL       Communication   Communication: No difficulties  Cognition Arousal/Alertness: Awake/alert Behavior During Therapy: WFL for tasks assessed/performed Overall Cognitive Status: Within Functional Limits for tasks assessed                                        General Comments      Exercises     Assessment/Plan    PT Assessment Patient needs continued PT services  PT Problem List Decreased balance;Decreased activity tolerance       PT Treatment Interventions Gait  training;Stair training;Functional mobility training;Therapeutic activities;Therapeutic exercise;Balance training;Patient/family education    PT Goals (Current goals can be found in the Care Plan section)  Acute Rehab PT Goals Patient Stated Goal: to go home PT Goal Formulation: With patient Time For Goal Achievement: 01/04/20 Potential to Achieve Goals: Good    Frequency Min 2X/week   Barriers to discharge        Co-evaluation               AM-PAC PT "6 Clicks" Mobility  Outcome Measure Help needed turning from your back to your side while in a flat bed without using bedrails?: None Help needed moving from lying on your back to sitting on the side of a flat bed without using bedrails?: None Help needed moving to and from a bed to a chair (including a wheelchair)?: None Help needed standing up from a chair using your arms (e.g., wheelchair or bedside chair)?: None Help needed to walk in hospital room?: None Help needed climbing 3-5 steps with a railing? : None 6 Click Score: 24    End of Session Equipment Utilized During Treatment: Gait belt Activity Tolerance: Patient tolerated treatment well Patient left: in chair;with call bell/phone within reach Nurse Communication: Mobility status PT Visit Diagnosis: Unsteadiness on feet (R26.81)    Time: 9767-3419 PT Time Calculation (min) (ACUTE ONLY): 25 min   Charges:  Laquentin Loudermilk SPT 12/22/19, 1:22 PM

## 2019-12-22 NOTE — Progress Notes (Signed)
Received Me order to discharge patient to home, reviewed follow up appointments, referral ,  discharge instructions, home meds and prescriptions with patient and patient verbalized understanding.

## 2019-12-22 NOTE — Discharge Summary (Signed)
Physician Discharge Summary  Roy A ReynolCAROLE [id number] DOB: [birth year] DOA: 12/21/2019  PCP: Marjie Skiff, NP  Admit date: 12/21/2019 Discharge date: 12/22/2019  Admitted From: Home Disposition: Home  Recommendations for Outpatient Follow-up:  1. Follow up with PCP in 1-2 weeks 2. Please obtain BMP/CBC in one week 3. We will send a referral to neurology to schedule a follow-up.  Home Health: Not applicable Equipment/Devices: None  Discharge Condition: Stable CODE STATUS: Full code Diet recommendation: Low-salt diet  Discharge summary: 86 year old gentleman with history of trigeminal neuralgia on the right side, hypertension, hyperlipidemia not treated, ventricular tachycardia status post pacemaker and on chronic amiodarone therapy presented to the emergency room with right facial droop for about 3 days.  Also reporting sore tongue for about 3 days using nystatin at home.  Went to see his doctor who noted facial droop and sent to ER.  Initial NIHSS score of 1. Patient was seen in the emergency room, a stroke alert was called.  Seen by neurology on arrival.  He was found to have right facial droop but no other motor or sensory loss.  Initial head CT was normal.  Right-sided facial droop: Peripheral facial nerve palsy on clinical exam. CT head on arrival after 3 days of symptom onset, repeat CT head in 24 hours essentially normal.  Could not have brain MRI because of incompatible pacemaker. Remained neurologically intact except right facial droop. Carotid duplexes with less than 39% stenosis.  2D echocardiogram with normal ejection fraction. Seen and followed by neurology. Patient walked around in the hallway with no sensory or motor deficit in his extremities.  Passed swallow evaluation.  Plan: Acute stroke or TIA ruled out. Patient already on aspirin that will be continued. LDL 151, not on any cholesterol medications, will start on atorvastatin 40 mg daily. No other changes  in medications were done. He has CKD 4 and is at about his base line.  With clinical stability, he was discharged home.  He was independent with no deficit and he declined any outpatient therapy referrals. Today, he is able to close his right eye.  He was advised about all aspiration precautions. We will send outpatient neurology follow-up with [facility] neurology.   Discharge Diagnoses:  Principal Problem:   Facial droop_right Active Problems:   CKD (chronic kidney disease), stage IIIa   Hyperlipidemia   Chronic systolic heart failure (HCC)   Trigeminal neuralgia   NSVT (nonsustained ventricular tachycardia) (HCC)   HTN (hypertension)    Discharge Instructions  Discharge Instructions    Ambulatory referral to Neurology   Complete by: As directed    An appointment is requested in approximately: 4 weeks   Call MD for:   Complete by: As directed    Any worsening symptoms   Diet - low sodium heart healthy   Complete by: As directed    Increase activity slowly   Complete by: As directed      Allergies as of 12/22/2019      Reactions   Amiodarone Other (See Comments)   Terrible dreams/nightmares in high dosing   Lyrica [pregabalin] Nausea And Vomiting      Medication List    TAKE these medications   acetaminophen 325 MG tablet Commonly known as: TYLENOL Take 325-650 mg by mouth every 6 (six) hours as needed for mild pain, moderate pain or fever.   albuterol 108 (90 Base) MCG/ACT inhaler Commonly known as: VENTOLIN HFA Inhale 2 puffs into the lungs every 6 (six) hours as needed  for wheezing or shortness of breath.   amiodarone 200 MG tablet Commonly known as: PACERONE TAKE 1 TABLET BY MOUTH EVERY DAY MONDAY THROUGH SATURDAY. DO NOT TAKE ON SUNDAY What changed: See the new instructions.   amLODipine 2.5 MG tablet Commonly known as: NORVASC TAKE 1 TABLET(2.5 MG) BY MOUTH DAILY What changed: See the new instructions.   aspirin EC 81 MG tablet Take 1 tablet (81 mg  total) by mouth daily.   atorvastatin 40 MG tablet Commonly known as: Lipitor Take 1 tablet (40 mg total) by mouth daily.   guaiFENesin 600 MG 12 hr tablet Commonly known as: MUCINEX Take 1 tablet (600 mg total) by mouth 2 (two) times daily. What changed:   when to take this  reasons to take this   MULTIVITAMIN ADULTS 50+ PO Take 1 tablet by mouth daily.   nystatin 100000 UNIT/ML suspension Commonly known as: MYCOSTATIN Take 5 mLs (500,000 Units total) by mouth 4 (four) times daily.       Allergies  Allergen Reactions  . Amiodarone Other (See Comments)    Terrible dreams/nightmares in high dosing  . Lyrica [Pregabalin] Nausea And Vomiting    Consultations:  Neurology   Procedures/Studies: CT HEAD WO CONTRAST  Result Date: 12/22/2019 CLINICAL DATA:  Follow-up code stroke presentation. Right facial droop. EXAM: CT HEAD WITHOUT CONTRAST TECHNIQUE: Contiguous axial images were obtained from the base of the skull through the vertex without intravenous contrast. COMPARISON:  12/21/2019 FINDINGS: Brain: There is no evidence of acute infarct, intracranial hemorrhage, mass, midline shift, or extra-axial fluid collection. Mild cerebral atrophy is within normal limits for age. Cerebral white matter hypodensities are unchanged and nonspecific but compatible with mild chronic small vessel ischemic disease. A chronic lacunar infarct is again noted in the right external capsule. Vascular: Calcified atherosclerosis at the skull base. No hyperdense vessel. Skull: No fracture or suspicious osseous lesion. Sinuses/Orbits: Visualized paranasal sinuses and mastoid air cells are clear. Bilateral cataract extraction. Other: None. IMPRESSION: 1. No evidence of acute intracranial abnormality. 2. Mild chronic small vessel ischemic disease. Electronically Signed   By: Sebastian Ache M.D.   On: 12/22/2019 07:50   US Carotid Bilateral (at [facility] and [facility] only)  Result Date: 12/22/2019 CLINICAL DATA:  Right  facial droop, hypertension and hyperlipidemia EXAM: BILATERAL CAROTID DUPLEX ULTRASOUND TECHNIQUE: Wallace Cullens scale imaging, color Doppler and duplex ultrasound were performed of bilateral carotid and vertebral arteries in the neck. COMPARISON:  None. FINDINGS: Criteria: Quantification of carotid stenosis is based on velocity parameters that correlate the residual internal carotid diameter with NASCET-based stenosis levels, using the diameter of the distal internal carotid lumen as the denominator for stenosis measurement. The following velocity measurements were obtained: RIGHT ICA: 72/21 cm/sec CCA: 84/19 cm/sec SYSTOLIC ICA/CCA RATIO:  0.9 ECA: 82 cm/sec LEFT ICA: 89/24 cm/sec CCA: 104/14 cm/sec SYSTOLIC ICA/CCA RATIO:  0.9 ECA: 66 cm/sec RIGHT CAROTID ARTERY: Minor atherosclerotic change. No hemodynamically significant right ICA stenosis, velocity elevation, turbulent flow. Degree of narrowing less than 50% by ultrasound criteria. RIGHT VERTEBRAL ARTERY:  Normal antegrade flow LEFT CAROTID ARTERY: Similar minor atherosclerotic change. No hemodynamically significant left ICA stenosis, velocity elevation, turbulent flow. Degree of narrowing also less than 50%. LEFT VERTEBRAL ARTERY:  Normal antegrade flow IMPRESSION: Minor carotid atherosclerosis. No hemodynamically significant ICA stenosis. Degree of narrowing less than 50% bilaterally by ultrasound criteria. Patent antegrade vertebral flow bilaterally Electronically Signed   By: Judie Petit.  Shick M.D.   On: 12/22/2019 07:44   ECHOCARDIOGRAM COMPLETE  Result  Date: 12/22/2019    ECHOCARDIOGRAM REPORT   Patient Name:   Roy Palmer Date of Exam: 12/21/2019 Medical Rec #:  [id number]       Height:       71.0 in Accession #:    [id number]      Weight:       186.0 lb Date of Birth:  11/01/1929        BSA:          2.045 m Patient Age:    86 years        BP:           128/70 mmHg Patient Gender: M               HR:           79 bpm. Exam Location:  [facility] Procedure: 2D Echo,  Cardiac Doppler and Color Doppler Indications:     I163.9 Stroke  History:         Patient has prior history of Echocardiogram examinations, most                  recent 04/25/2017. Mitral Valve Repair and Defibrillator; Risk                  Factors:Hypertension and Dyslipidemia. Syncope. Chronic kidney                  disease. AAA.  Sonographer:     Sedonia Small Rodgers-Jones Referring Phys:  [name] NIU Diagnosing Phys: [name] MD IMPRESSIONS  1. Left ventricular ejection fraction, by estimation, is 45 to 50%. The left ventricle has mildly decreased function. The left ventricle demonstrates global hypokinesis. The left ventricular internal cavity size was upper normal in size. There is moderate left ventricular hypertrophy. Left ventricular diastolic parameters are indeterminate.  2. Right ventricular systolic function is normal. The right ventricular size is mildly enlarged. Mildly increased right ventricular wall thickness. There is normal pulmonary artery systolic pressure.  3. The mitral valve has been repaired/replaced. Mild mitral valve regurgitation. No evidence of mitral stenosis. The mean mitral valve gradient is 3.0 mmHg.  4. The aortic valve is tricuspid. Aortic valve regurgitation is trivial. Mild to moderate aortic valve sclerosis/calcification is present, without any evidence of aortic stenosis.  5. Aortic dilatation noted. There is mild dilatation of the aortic root measuring 41 mm.  6. The inferior vena cava is normal in size with greater than 50% respiratory variability, suggesting right atrial pressure of 3 mmHg. FINDINGS  Left Ventricle: Left ventricular ejection fraction, by estimation, is 45 to 50%. The left ventricle has mildly decreased function. The left ventricle demonstrates global hypokinesis. The left ventricular internal cavity size was upper normal in size. There is moderate left ventricular hypertrophy. Abnormal (paradoxical) septal motion, consistent with RV pacemaker. Left  ventricular diastolic parameters are indeterminate. Right Ventricle: The right ventricular size is mildly enlarged. Mildly increased right ventricular wall thickness. Right ventricular systolic function is normal. There is normal pulmonary artery systolic pressure. The tricuspid regurgitant velocity is 2.25 m/s, and with an assumed right atrial pressure of 3 mmHg, the estimated right ventricular systolic pressure is 23.2 mmHg. Left Atrium: Left atrial size was normal in size. Right Atrium: Right atrial size was normal in size. Pericardium: There is no evidence of pericardial effusion. Mitral Valve: The mitral valve has been repaired/replaced. There is mild thickening of the mitral valve leaflet(s). Mild mitral valve regurgitation. There is a prosthetic annuloplasty ring present in  the mitral position. No evidence of mitral valve stenosis. MV peak gradient, 6.9 mmHg. The mean mitral valve gradient is 3.0 mmHg. Tricuspid Valve: The tricuspid valve is grossly normal. Tricuspid valve regurgitation is trivial. Aortic Valve: The aortic valve is tricuspid. . There is mild thickening and mild calcification of the aortic valve. Aortic valve regurgitation is trivial. Mild to moderate aortic valve sclerosis/calcification is present, without any evidence of aortic stenosis. There is mild thickening of the aortic valve. There is mild calcification of the aortic valve. Pulmonic Valve: The pulmonic valve was not well visualized. Pulmonic valve regurgitation is not visualized. No evidence of pulmonic stenosis. Aorta: Aortic dilatation noted. There is mild dilatation of the aortic root measuring 41 mm. Pulmonary Artery: The pulmonary artery is not well seen. Venous: The inferior vena cava is normal in size with greater than 50% respiratory variability, suggesting right atrial pressure of 3 mmHg. IAS/Shunts: The interatrial septum was not well visualized. Additional Comments: A pacer wire is visualized in the right ventricle.  LEFT  VENTRICLE PLAX 2D LVIDd:         5.23 cm      Diastology LVIDs:         3.74 cm      LV e' lateral:   8.49 cm/s LV PW:         1.38 cm      LV E/e' lateral: 11.7 LV IVS:        1.38 cm      LV e' medial:    5.11 cm/s LVOT diam:     2.10 cm      LV E/e' medial:  19.5 LV SV:         62 LV SV Index:   30 LVOT Area:     3.46 cm  LV Volumes (MOD) LV vol d, MOD A2C: 77.6 ml LV vol d, MOD A4C: 113.4 ml LV vol s, MOD A4C: 50.2 ml LV SV MOD A4C:     113.4 ml RIGHT VENTRICLE RV Basal diam:  4.94 cm RV S prime:     13.40 cm/s TAPSE (M-mode): 2.2 cm LEFT ATRIUM             Index       RIGHT ATRIUM           Index LA diam:        4.80 cm 2.35 cm/m  RA Area:     12.60 cm LA Vol (A2C):   80.9 ml 39.57 ml/m RA Volume:   33.20 ml  16.24 ml/m LA Vol (A4C):   39.4 ml 19.27 ml/m LA Biplane Vol: 61.1 ml 29.88 ml/m  AORTIC VALVE LVOT Vmax:   105.00 cm/s LVOT Vmean:  72.850 cm/s LVOT VTI:    0.180 m  AORTA Ao Root diam: 4.10 cm Ao Asc diam:  3.50 cm MITRAL VALVE                TRICUSPID VALVE MV Area (PHT): 3.03 cm     TR Peak grad:   20.2 mmHg MV Peak grad:  6.9 mmHg     TR Vmax:        225.00 cm/s MV Mean grad:  3.0 mmHg MV Vmax:       1.31 m/s     SHUNTS MV Vmean:      82.1 cm/s    Systemic VTI:  0.18 m MV Decel Time: 250 msec     Systemic Diam: 2.10 cm MV E velocity: 99.50 cm/s  MV A velocity: 118.00 cm/s MV E/A ratio:  0.84 [name] MD Electronically signed by [name] MD Signature Date/Time: 12/22/2019/7:19:25 AM    Final    CUP PACEART REMOTE DEVICE CHECK  Result Date: 12/21/2019 Scheduled remote reviewed. Normal device function.  Next remote 91 days.  CT HEAD CODE STROKE WO CONTRAST  Result Date: 12/21/2019 CLINICAL DATA:  Code stroke.  Neuro deficit. EXAM: CT HEAD WITHOUT CONTRAST TECHNIQUE: Contiguous axial images were obtained from the base of the skull through the vertex without intravenous contrast. COMPARISON:  12/24/2007 MRI/MRA head. FINDINGS: Brain: No acute infarct or intracranial hemorrhage.  Remote bilateral frontal white matter infarcts. Mild diffuse parenchymal volume loss with ex vacuo dilatation. Scattered and confluent supratentorial white matter hypodensities are nonspecific however commonly associated with chronic microvascular ischemic changes. No mass lesion. No midline shift or extra-axial fluid collection. Vascular: No hyperdense vessel. Bilateral carotid siphon atherosclerotic calcifications. Skull: Negative for fracture or focal lesion. Sinuses/Orbits: Normal orbits. Clear paranasal sinuses. No mastoid effusion. Other: None. ASPECTS [facility] Stroke Program Early CT Score) - Ganglionic level infarction (caudate, lentiform nuclei, internal capsule, insula, M1-M3 cortex): 7 - Supraganglionic infarction (M4-M6 cortex): 3 Total score (0-10 with 10 being normal): 10 IMPRESSION: 1. No acute infarct or intracranial hemorrhage. Remote bilateral frontal white matter infarcts. 2. ASPECTS is 10 3. Mild cerebral atrophy and chronic microvascular ischemic changes. Code stroke imaging results were communicated on 12/21/2019 at 12:11 pm to provider Dr. Daryel November via telephone Electronically Signed   By: Stana Bunting M.D.   On: 12/21/2019 12:20    Subjective: Patient was seen and examined.  No overnight events.  He is up about and ready to go home.  He has dealt with trigeminal neuralgia for long time and he is aware about peripheral neurological deficits.  His main complaint is coated tongue and soreness. His symptoms started 4 days now. "I am back to myself and ambulating, I will not need any therapies"   Discharge Exam: Vitals:   12/22/19 1329 12/22/19 1537  BP: 115/66 126/68  Pulse: 66 67  Resp: 18 15  Temp: 98.1 F (36.7 C) 98.2 F (36.8 C)  SpO2: 97% 98%   Vitals:   12/22/19 0826 12/22/19 1213 12/22/19 1329 12/22/19 1537  BP: 122/66 (!) 81/69 115/66 126/68  Pulse: 70  66 67  Resp: Temp: 97.7 F (36.5 C)  98.1 F (36.7 C) 98.2 F (36.8 C)  TempSrc:  Oral  Oral Oral  SpO2: 97%  97% 98%  Weight:      Height:        General: Pt is alert, awake, not in acute distress Cardiovascular: RRR, S1/S2 +, no rubs, no gallops, pacemaker present. Respiratory: CTA bilaterally, no wheezing, no rhonchi Abdominal: Soft, NT, ND, bowel sounds + Extremities: no edema, no cyanosis Neurological exam: Alert oriented x4. Cranial nerves, right facial droop, slightly decreased right eye closure, however able to close without a gap. Upper and lower extremity motor power normal.    The results of significant diagnostics from this hospitalization (including imaging, microbiology, ancillary and laboratory) are listed below for reference.     Microbiology: Recent Results (from the past 240 hour(s))  SARS Coronavirus 2 by RT PCR (hospital order, performed in [facility] hospital lab) Nasopharyngeal Nasopharyngeal Swab     Status: None   Collection Time: 12/21/19 12:03 PM   Specimen: Nasopharyngeal Swab  Result Value Ref Range Status   SARS Coronavirus 2 NEGATIVE NEGATIVE Final  Comment: (NOTE) SARS-CoV-2 target nucleic acids are NOT DETECTED.  The SARS-CoV-2 RNA is generally detectable in upper and lower respiratory specimens during the acute phase of infection. The lowest concentration of SARS-CoV-2 viral copies this assay can detect is 250 copies / mL. A negative result does not preclude SARS-CoV-2 infection and should not be used as the sole basis for treatment or other patient management decisions.  A negative result may occur with improper specimen collection / handling, submission of specimen other than nasopharyngeal swab, presence of viral mutation(s) within the areas targeted by this assay, and inadequate number of viral copies (<250 copies / mL). A negative result must be combined with clinical observations, patient history, and epidemiological information.  Fact Sheet for Patients:   BoilerBrush.com.cy  Fact Sheet  for Healthcare Providers: https://pope.com/  This test is not yet approved or  cleared by the [place] FDA and has been authorized for detection and/or diagnosis of SARS-CoV-2 by FDA under an Emergency Use Authorization (EUA).  This EUA will remain in effect (meaning this test can be used) for the duration of the COVID-19 declaration under Section 564(b)(1) of the Act, 21 U.S.C. section 360bbb-3(b)(1), unless the authorization is terminated or revoked sooner.  Performed at [facility], [address] [place], [place] [address]      Labs: BNP (last 3 results) Recent Labs    12/21/19 1203  BNP 149.0*   Basic Metabolic Panel: Recent Labs  Lab 12/21/19 1203  NA 137  K 4.7  CL 102  CO2 27  GLUCOSE 106*  BUN 26*  CREATININE 1.81*  CALCIUM 8.6*   Liver Function Tests: Recent Labs  Lab 12/21/19 1203  AST 17  ALT 18  ALKPHOS 48  BILITOT 0.8  PROT 6.8  ALBUMIN 3.9   No results for input(s): LIPASE, AMYLASE in the last 168 hours. No results for input(s): AMMONIA in the last 168 hours. CBC: Recent Labs  Lab 12/21/19 1203  WBC 6.9  NEUTROABS 4.9  HGB 14.7  HCT 44.7  MCV 96.3  PLT 147*   Cardiac Enzymes: No results for input(s): CKTOTAL, CKMB, CKMBINDEX, TROPONINI in the last 168 hours. BNP: Invalid input(s): POCBNP CBG: Recent Labs  Lab 12/21/19 1145  GLUCAP 112*   D-Dimer No results for input(s): DDIMER in the last 72 hours. Hgb A1c Recent Labs    12/22/19 0536  HGBA1C 6.1*   Lipid Profile Recent Labs    12/22/19 0536  CHOL 225*  HDL 41  LDLCALC 151*  TRIG 165*  CHOLHDL 5.5   Thyroid function studies No results for input(s): TSH, T4TOTAL, T3FREE, THYROIDAB in the last 72 hours.  Invalid input(s): FREET3 Anemia work up No results for input(s): VITAMINB12, FOLATE, FERRITIN, TIBC, IRON, RETICCTPCT in the last 72 hours. Urinalysis    Component Value Date/Time   COLORURINE YELLOW (A) 10/20/2019  2002   APPEARANCEUR CLEAR (A) 10/20/2019 2002   APPEARANCEUR Clear 07/30/2016 1106   LABSPEC 1.011 10/20/2019 2002   PHURINE 5.0 10/20/2019 2002   GLUCOSEU NEGATIVE 10/20/2019 2002   HGBUR SMALL (A) 10/20/2019 2002   BILIRUBINUR NEGATIVE 10/20/2019 2002   BILIRUBINUR Negative 07/30/2016 1106   KETONESUR NEGATIVE 10/20/2019 2002   PROTEINUR NEGATIVE 10/20/2019 2002   NITRITE NEGATIVE 10/20/2019 2002   LEUKOCYTESUR NEGATIVE 10/20/2019 2002   Sepsis Labs Invalid input(s): PROCALCITONIN,  WBC,  LACTICIDVEN Microbiology Recent Results (from the past 240 hour(s))  SARS Coronavirus 2 by RT PCR (hospital order, performed in [facility] hospital lab) Nasopharyngeal  Nasopharyngeal Swab     Status: None   Collection Time: 12/21/19 12:03 PM   Specimen: Nasopharyngeal Swab  Result Value Ref Range Status   SARS Coronavirus 2 NEGATIVE NEGATIVE Final    Comment: (NOTE) SARS-CoV-2 target nucleic acids are NOT DETECTED.  The SARS-CoV-2 RNA is generally detectable in upper and lower respiratory specimens during the acute phase of infection. The lowest concentration of SARS-CoV-2 viral copies this assay can detect is 250 copies / mL. A negative result does not preclude SARS-CoV-2 infection and should not be used as the sole basis for treatment or other patient management decisions.  A negative result may occur with improper specimen collection / handling, submission of specimen other than nasopharyngeal swab, presence of viral mutation(s) within the areas targeted by this assay, and inadequate number of viral copies (<250 copies / mL). A negative result must be combined with clinical observations, patient history, and epidemiological information.  Fact Sheet for Patients:   BoilerBrush.com.cy  Fact Sheet for Healthcare Providers: https://pope.com/  This test is not yet approved or  cleared by the [place] FDA and has been authorized for  detection and/or diagnosis of SARS-CoV-2 by FDA under an Emergency Use Authorization (EUA).  This EUA will remain in effect (meaning this test can be used) for the duration of the COVID-19 declaration under Section 564(b)(1) of the Act, 21 U.S.C. section 360bbb-3(b)(1), unless the authorization is terminated or revoked sooner.  Performed at [facility], [address] [place], [place] [address]      Time coordinating discharge:  minutes  SIGNED:   Dorcas Carrow, MD  [facility] 12/22/2019, 6:41 PM

## 2019-12-22 NOTE — Evaluation (Signed)
Clinical/Bedside Swallow Evaluation Patient Details  Name: Roy Palmer MRN: [id number] Date of Birth: 1929/12/22  Today's Date: 12/22/2019 Time: SLP Start Time (ACUTE ONLY): 1030 SLP Stop Time (ACUTE ONLY): 1130 SLP Time Calculation (min) (ACUTE ONLY): 60 min  Past Medical History:  Past Medical History:  Diagnosis Date  . AAA (abdominal aortic aneurysm) without rupture (HCC) 03/12/2016   Small - measured 3.3 x 3.5 cm by CTA  . CKD (chronic kidney disease) stage 3, GFR 30-59 ml/min   . Essential hypertension   . Hyperlipidemia   . Hypertensive kidney disease   . Incidental pulmonary nodule 03/12/2016   Vague opacity right lung base requires radiographic follow up - index of suspicion is LOW  . Near syncope    Cardiogenic, related to an SVT and severe MR  . Non-sustained ventricular tachycardia (HCC)   . S/P minimally invasive mitral valve repair 03/19/2016   Complex valvuloplasty including triangular resection of flail segment of posterior leaflet, chordal transposition x1, artificial Gore-tex neochord placement x4 and 34 mm Sorin Memo 3D ring annuloplasty via right mini thoracotomy approach with clipping of LA appendage  . Severe mitral regurgitation by prior echocardiogram 02/23/2016   Confirmed by TEE  . Syncope 03/15/2016  . Tennis elbow left  . Thoracic aortic aneurysm (HCC) 03/12/2016   Very very mild fusiform enlargement of distal transverse and proximal descending thoracic aorta noted on CTA  . Trigeminal neuralgia    Past Surgical History:  Past Surgical History:  Procedure Laterality Date  . CARDIAC CATHETERIZATION N/A 02/23/2016   Procedure: Right/Left Heart Cath and Coronary Angiography;  Surgeon: [name], MD;  Location: [facility] INVASIVE CV LAB;  Service: Cardiovascular: Angiographic minimal coronary disease. Normal left and right heart Pressures. Decreased CO/CI in settng of frequent PVCs & Severe MR.  Marland Kitchen CATARACT EXTRACTION, BILATERAL    . COLONOSCOPY  08/2005   . CYST EXCISION  10/2013   from neck  . EP IMPLANTABLE DEVICE N/A 03/27/2016   Procedure: ICD Implant Dual Chamber;  Surgeon: Marinus Maw, MD;  Location: [facility] INVASIVE CV LAB;  Service: Cardiovascular;  Laterality: N/A;  . ESOPHAGOGASTRODUODENOSCOPY  02/2010  . MITRAL VALVE REPAIR Right 03/19/2016   Procedure: MINIMALLY INVASIVE MITRAL VALVE REPAIR (MVR);  Surgeon: [name], MD;  Location: [facility] OR;  Service: Open Heart Surgery;  Laterality: Right;  . TEE WITHOUT CARDIOVERSION N/A 02/23/2016   Procedure: TRANSESOPHAGEAL ECHOCARDIOGRAM (TEE);  Surgeon: Quintella Reichert, MD;  Location: [facility] ENDOSCOPY;  Service: Cardiovascular: Normal LV size and function.  Degenerative mitral valve disease with failed posterior leaflet (P2 segment), Severe MR with pulmonary vein systolic flow reversal. Moderate-TR.    . TEE WITHOUT CARDIOVERSION N/A 03/19/2016   Procedure: TRANSESOPHAGEAL ECHOCARDIOGRAM (TEE);  Surgeon: [name], MD;  Location: [facility] OR;  Service: Open Heart Surgery;  Laterality: N/A;  . TRANSTHORACIC ECHOCARDIOGRAM  02/14/2016   EF 50-55% with moderate LVH. Possible inferolateral hypokinesis. Moderate-severe MR with posterior leaflet prolapse, cannot rule out flail. Severe LA dilation. Moderate TR.   HPI:  Pt is a 85 y.o. male with medical history significant of trigeminal neuralgia, hypertension, hyperlipidemia, GERD, AAA, thoracic aortic aneurysm, s/p of mitral valve repair for mitral valve regurgitation, NSVT, CKD-3, sCHF with EF 25-30%, ICD placement, who presents with right facial droop to the ED.  Pt endorsed he had an episode of similar ~12 years ago.  Pt endorses "tongue pain for 3 days" -- was given a "mouthwash" for an oral rinse/spit regimen per MD prior  to this admit.  Pt is A/O x4; follows all instructions.    Assessment / Plan / Recommendation Clinical Impression  Pt appears to present w/ grossly adequate oropharyngeal phase swallow w/ No pharyngeal phase dysphagia noted, No  overt s/s of aspiration noted w/ trials given. Pt presented to the ED w/ c/o Trigeminal Neuralgia per Neurology note - he does present w/ R labial-facial weakness impacting R eye closure/watering, decreased R forehead movement during exagerated expressions, decreased R facial/fold tone, R labial tone/droop. Many of the s/s appear similar to Bell's Palsy. Pt also c/o min sensitivity in area at TMJ area at R ear and lingual sensitivity - mild pain(2) upon touch w/ tongue blade. Pt was unable to increase labial tone w/ exagerated smile. Pt was sitting upright in chair and consumed po trials w/ No overt, clinical s/s of aspiration during po trials. Pt appears at reduced risk for aspiration following general aspiration precautions. He consumed all consistencies w/ no overt coughing, decline in vocal quality, or change in respiratory presentation during/post trials. Oral phase revealed timely bolus management, mastication, and control of bolus propulsion for A-P transfer for swallowing. Decreased labial closure on utensil and straw when drinking d/t R labial weakness/tone; air leakage noted during straw use. Slight bolus residue noted at R corner of mouth which pt cleared w/ lingual sweep. Oral clearing achieved w/ all trial consistencies though pt stated he felt he had R buccal residue intermittently which he clears w/ lingual and/or finger sweeps. OM Exam revealed unilateral labial weakness/decreaed tone w/ slight Left lingual deviation noted. Speech slightly Dysarthric but 100% intelligible. Pt fed self.  Recommend continue a Regular consistency diet w/ well-Cut meats, moistened foods; Thin liquids- straw helpful for intake d/t R labial weakness. Recommend general aspiration precautions, Pills WHOLE in Puree for safer, easier swallowing if needed. Education given on Pills in Puree; food consistencies and easy to eat options; general aspiration precautions. No OMEs recommended d/t concern for Bell's Palsy r/o;  Trigeminal neuralgia. Pt to f/u w/ PCP once dxs complete if referral to skilled ST services is indicated. NSG to reconsult if any new needs arise. NSG/MD agreed. Per discussion w/ MD, Trigeminal neuralgia, Bell's Palsy being assessed; Diflucan and swish and swallow oral rinse ordered for tx of Thrush.  SLP Visit Diagnosis: Dysphagia, oral phase (R13.11) (R labial/facial decreased tone; Trigeminal neuralgia)    Aspiration Risk   (reduced following precautions)    Diet Recommendation  Regular diet w/ cut meats, moistened foods; Thin liquids. General aspiration precautions w/ straw use - check oral clearing w/ lingual/finger sweeps  Medication Administration: Whole meds with puree (if needed for ease of swallowing)    Other  Recommendations Recommended Consults:  (Neurology) Oral Care Recommendations: Oral care BID;Oral care before and after PO;Patient independent with oral care Other Recommendations:  (n/a)   Follow up Recommendations None      Frequency and Duration  (n/a)   (n/a)       Prognosis Prognosis for Safe Diet Advancement: Good Barriers to Reach Goals: Time post onset;Severity of deficits      Swallow Study   General Date of Onset: 12/21/19 HPI: Pt is a 86 y.o. male with medical history significant of trigeminal neuralgia, hypertension, hyperlipidemia, GERD, AAA, thoracic aortic aneurysm, s/p of mitral valve repair for mitral valve regurgitation, NSVT, CKD-3, sCHF with EF 25-30%, ICD placement, who presents with right facial droop to the ED.  Pt endorsed he had an episode of similar ~12 years ago.  Pt  endorses "tongue pain for 3 days" -- was given a "mouthwash" for an oral rinse/spit regimen per MD prior to this admit.  Pt is A/O x4; follows all instructions.  Type of Study: Bedside Swallow Evaluation Previous Swallow Assessment: none Diet Prior to this Study: Regular;Thin liquids Temperature Spikes Noted: No (wbc 6.9) Respiratory Status: Room air History of Recent  Intubation: No Behavior/Cognition: Alert;Cooperative;Pleasant mood Oral Cavity Assessment:  (coated tongue) Oral Care Completed by SLP: Yes (pt) Oral Cavity - Dentition: Adequate natural dentition;Missing dentition (uses a partial) Vision: Functional for self-feeding (some discomfort in R eye) Self-Feeding Abilities: Able to feed self Patient Positioning: Upright in chair Baseline Vocal Quality: Normal Volitional Cough: Strong Volitional Swallow: Able to elicit    Oral/Motor/Sensory Function Overall Oral Motor/Sensory Function: Mild impairment (+) Facial ROM: Reduced right;Suspected CN VII (facial) dysfunction Facial Symmetry: Abnormal symmetry right;Suspected CN VII (facial) dysfunction Facial Strength: Reduced right;Suspected CN VII (facial) dysfunction Facial Sensation: Reduced right Lingual ROM: Within Functional Limits Lingual Symmetry:  (slight movement to his Left) Lingual Strength: Within Functional Limits Lingual Sensation: Within Functional Limits (sensitive) Velum: Within Functional Limits Mandible: Within Functional Limits   Ice Chips Ice chips: Within functional limits Presentation: Spoon (fed; 2 trials)   Thin Liquid Thin Liquid: Impaired (labial) Presentation: Self Fed;Straw (4+ ozs) Oral Phase Impairments: Reduced labial seal Oral Phase Functional Implications:  (none) Pharyngeal  Phase Impairments:  (none)    Nectar Thick Nectar Thick Liquid: Not tested   Honey Thick Honey Thick Liquid: Not tested   Puree Puree: Impaired Presentation: Self Fed;Spoon (4ozs) Oral Phase Impairments: Reduced labial seal Oral Phase Functional Implications: Right anterior spillage (slight) Pharyngeal Phase Impairments:  (none)   Solid     Solid: Impaired Presentation: Self Fed;Spoon (6 trials) Oral Phase Impairments: Reduced labial seal Oral Phase Functional Implications: Oral residue (slight) Pharyngeal Phase Impairments:  (none) Other Comments: lingual and/or finger sweeping  to clear        Jerilynn Som, MS, CCC-SLP [facility] 12/22/2019,4:06 PM

## 2019-12-29 ENCOUNTER — Telehealth: Payer: Self-pay | Admitting: Nurse Practitioner

## 2019-12-29 NOTE — Telephone Encounter (Signed)
Called pt, VM full

## 2019-12-29 NOTE — Telephone Encounter (Signed)
-----   Message from Marjie Skiff, NP sent at 12/23/2019  7:39 AM EDT ----- Will need hospital follow-up next week with provider in office

## 2020-01-06 ENCOUNTER — Encounter: Payer: Self-pay | Admitting: Nurse Practitioner

## 2020-01-06 ENCOUNTER — Other Ambulatory Visit: Payer: Self-pay

## 2020-01-06 ENCOUNTER — Ambulatory Visit: Payer: Medicare Other | Admitting: Nurse Practitioner

## 2020-01-06 VITALS — BP 126/69 | HR 75 | Temp 98.2°F | Wt 181.4 lb

## 2020-01-06 DIAGNOSIS — Z09 Encounter for follow-up examination after completed treatment for conditions other than malignant neoplasm: Secondary | ICD-10-CM | POA: Diagnosis not present

## 2020-01-06 DIAGNOSIS — R29810 Facial weakness: Secondary | ICD-10-CM

## 2020-01-06 DIAGNOSIS — E782 Mixed hyperlipidemia: Secondary | ICD-10-CM | POA: Diagnosis not present

## 2020-01-06 NOTE — Patient Instructions (Signed)
Bell Palsy, Adult  Bell palsy is a short-term inability to move muscles in part of the face. The inability to move (paralysis) results from inflammation or compression of the facial nerve, which travels along the skull and under the ear to the side of the face (7th cranial nerve). This nerve is responsible for facial movements that include blinking, closing the eyes, smiling, and frowning. What are the causes? The exact cause of this condition is not known. It may be caused by an infection from a virus, such as the chickenpox (herpes zoster), Epstein-Barr, or mumps virus. What increases the risk? You are more likely to develop this condition if:  You are pregnant.  You have diabetes.  You have had a recent infection in your nose, throat, or airways (upper respiratory infection).  You have a weakened body defense system (immune system).  You have had a facial injury, such as a fracture.  You have a family history of Bell palsy. What are the signs or symptoms? Symptoms of this condition include:  Weakness on one side of the face.  Drooping eyelid and corner of the mouth.  Excessive tearing in one eye.  Difficulty closing the eyelid.  Dry eye.  Drooling.  Dry mouth.  Changes in taste.  Change in facial appearance.  Pain behind one ear.  Ringing in one or both ears.  Sensitivity to sound in one ear.  Facial twitching.  Headache.  Impaired speech.  Dizziness.  Difficulty eating or drinking. Most of the time, only one side of the face is affected. Rarely, Bell palsy affects the whole face. How is this diagnosed? This condition is diagnosed based on:  Your symptoms.  Your medical history.  A physical exam. You may also have to see health care providers who specialize in disorders of the nerves (neurologist) or diseases and conditions of the eye (ophthalmologist). You may have tests, such as:  A test to check for nerve damage (electromyogram).  Imaging  studies, such as CT or MRI scans.  Blood tests. How is this treated? This condition affects every person differently. Sometimes symptoms go away without treatment within a couple weeks. If treatment is needed, it varies from person to person. The goal of treatment is to reduce inflammation and protect the eye from damage. Treatment for Bell palsy may include:  Medicines, such as: ? Steroids to reduce swelling and inflammation. ? Antiviral drugs. ? Pain relievers, including aspirin, acetaminophen, or ibuprofen.  Eye drops or ointment to keep your eye moist.  Eye protection, if you cannot close your eye.  Exercises or massage to regain muscle strength and function (physical therapy). Follow these instructions at home:   Take over-the-counter and prescription medicines only as told by your health care provider.  If your eye is affected: ? Keep your eye moist with eye drops or ointment as told by your health care provider. ? Follow instructions for eye care and protection as told by your health care provider.  Do any physical therapy exercises as told by your health care provider.  Keep all follow-up visits as told by your health care provider. This is important. Contact a health care provider if:  You have a fever.  Your symptoms do not get better within 2-3 weeks, or your symptoms get worse.  Your eye is red, irritated, or painful.  You have new symptoms. Get help right away if:  You have weakness or numbness in a part of your body other than your face.  You have   trouble swallowing.  You develop neck pain or stiffness.  You develop dizziness or shortness of breath. Summary  Bell palsy is a short-term inability to move muscles in part of the face. The inability to move (paralysis) results from inflammation or compression of the facial nerve.  This condition affects every person differently. Sometimes symptoms go away without treatment within a couple weeks.  If  treatment is needed, it varies from person to person. The goal of treatment is to reduce inflammation and protect the eye from damage.  Contact your health care provider if your symptoms do not get better within 2-3 weeks, or your symptoms get worse. This information is not intended to replace advice given to you by your health care provider. Make sure you discuss any questions you have with your health care provider. Document Revised: 05/02/2017 Document Reviewed: 07/23/2016 [employer] Patient Education  2020 [employer]  

## 2020-01-06 NOTE — Progress Notes (Signed)
BP 126/69 (BP Location: Left Arm, Patient Position: Sitting, Cuff Size: Normal)   Pulse 75   Temp 98.2 F (36.8 C) (Oral)   Wt 181 lb 6.4 oz (82.3 kg)   SpO2 96%   BMI 25.30 kg/m    Subjective:    Patient ID: Roy Palmer, male    DOB: [birth year], 86 y.o.   MRN: [id number]  HPI: Roy Palmer is a 86 y.o. male presenting for Hospital follow up.  Chief Complaint  Patient presents with  . Hospitalization Follow-up   HOSPITALIZATION FOLLOW UP Patient was brushing his teeth on the morning of 12/21/2019 and noticed the entire right side of his face was drooping.  He thought he was having a stroke, so he went to the ER.  Does have a history of trigeminal neuralgia. Time since discharge: 15 days Hospital/facility: [facility] Diagnosis: Facial Droop Procedures/tests: CT head x2 both essentially normal; unable to perform MRI due to pacemaker; carotid duplexes showed less than 39% stenosis, 2D echo with normal EF. Consultants: Neurology New medications: atorvastatin 40 mg Discharge instructions:  Follow up with Neurologist, continue low sodium heart healthy diet Status: better  Was dignoased with trigeminal neurlagia about 25 years ago - has a Insurance account manager in [place] and an appointment set up for mid-September.  Reports that was the earliest he could get.  Allergies  Allergen Reactions  . Amiodarone Other (See Comments)    Terrible dreams/nightmares in high dosing  . Lyrica [Pregabalin] Nausea And Vomiting   Outpatient Encounter Medications as of 01/06/2020  Medication Sig  . acetaminophen (TYLENOL) 325 MG tablet Take 325-650 mg by mouth every 6 (six) hours as needed for mild pain, moderate pain or fever.   Marland Kitchen albuterol (VENTOLIN HFA) 108 (90 Base) MCG/ACT inhaler Inhale 2 puffs into the lungs every 6 (six) hours as needed for wheezing or shortness of breath.  Marland Kitchen amiodarone (PACERONE) 200 MG tablet TAKE 1 TABLET BY MOUTH EVERY DAY MONDAY THROUGH SATURDAY. DO NOT TAKE ON SUNDAY (Patient  taking differently: Take 200 mg by mouth See admin instructions. Take 1 tablet (200mg ) by mouth every day Monday through Saturday - do not take on Sunday)  . amLODipine (NORVASC) 2.5 MG tablet TAKE 1 TABLET(2.5 MG) BY MOUTH DAILY (Patient taking differently: Take 2.5 mg by mouth daily. )  . aspirin EC 81 MG tablet Take 1 tablet (81 mg total) by mouth daily.  Saturday atorvastatin (LIPITOR) 40 MG tablet Take 1 tablet (40 mg total) by mouth daily.  . Multiple Vitamins-Minerals (MULTIVITAMIN ADULTS 50+ PO) Take 1 tablet by mouth daily.  Marland Kitchen guaiFENesin (MUCINEX) 600 MG 12 hr tablet Take 1 tablet (600 mg total) by mouth 2 (two) times daily. (Patient not taking: Reported on 01/06/2020)  . [DISCONTINUED] nystatin (MYCOSTATIN) 100000 UNIT/ML suspension Take 5 mLs (500,000 Units total) by mouth 4 (four) times daily. (Patient not taking: Reported on 01/06/2020)   No facility-administered encounter medications on file as of 01/06/2020.   Patient Active Problem List   Diagnosis Date Noted  . Facial droop_right 12/21/2019  . HTN (hypertension) 12/21/2019  . Trigeminal neuralgia   . NSVT (nonsustained ventricular tachycardia) (HCC)   . Sepsis (HCC) 10/20/2019  . ICD (implantable cardioverter-defibrillator) in place 06/17/2019  . History of ventricular tachycardia 03/18/2019  . IFG (impaired fasting glucose) 01/20/2019  . Gastroesophageal reflux disease 07/22/2018  . Advanced care planning/counseling discussion 06/11/2017  . Chronic systolic heart failure (HCC) 10/22/2016  . Nonischemic cardiomyopathy (HCC) 05/07/2016  . Encounter for therapeutic  drug monitoring 04/01/2016  . S/P minimally invasive mitral valve repair 03/19/2016  . 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 CKD (chronic kidney disease), stage IIIa 03/18/2016  . Hyperlipidemia 03/18/2016  . Incidental pulmonary nodule 03/12/2016  . AAA (abdominal aortic aneurysm) without rupture (HCC) 03/12/2016  .  Thoracic aortic aneurysm (HCC) 03/12/2016  . Ventricular tachycardia (HCC)   . Symptomatic PVCs 02/24/2016  . Non-rheumatic mitral regurgitation 02/23/2016  . Right hip pain 02/02/2015   Past Medical History:  Diagnosis Date  . AAA (abdominal aortic aneurysm) without rupture (HCC) 03/12/2016   Small - measured 3.3 x 3.5 cm by CTA  . CKD (chronic kidney disease) stage 3, GFR 30-59 ml/min   . Essential hypertension   . Hyperlipidemia   . Hypertensive kidney disease   . Incidental pulmonary nodule 03/12/2016   Vague opacity right lung base requires radiographic follow up - index of suspicion is LOW  . Near syncope    Cardiogenic, related to an SVT and severe MR  . Non-sustained ventricular tachycardia (HCC)   . S/P minimally invasive mitral valve repair 03/19/2016   Complex valvuloplasty including triangular resection of flail segment of posterior leaflet, chordal transposition x1, artificial Gore-tex neochord placement x4 and 34 mm Sorin Memo 3D ring annuloplasty via right mini thoracotomy approach with clipping of LA appendage  . Severe mitral regurgitation by prior echocardiogram 02/23/2016   Confirmed by TEE  . Syncope 03/15/2016  . Tennis elbow left  . Thoracic aortic aneurysm (HCC) 03/12/2016   Very very mild fusiform enlargement of distal transverse and proximal descending thoracic aorta noted on CTA  . Trigeminal neuralgia    Relevant past medical, surgical, family and social history reviewed and updated as indicated. Interim medical history since our last visit reviewed.  Review of Systems  Constitutional: Negative.  Negative for activity change, appetite change and fatigue.  HENT: Negative.  Negative for congestion and trouble swallowing.   Skin: Negative.  Negative for rash.  Neurological: Positive for numbness. Negative for dizziness, light-headedness and headaches.       Right-sided facial droop  Psychiatric/Behavioral: Negative.  Negative for agitation, confusion and  sleep disturbance. The patient is not nervous/anxious.     Per HPI unless specifically indicated above     Objective:    BP 126/69 (BP Location: Left Arm, Patient Position: Sitting, Cuff Size: Normal)   Pulse 75   Temp 98.2 F (36.8 C) (Oral)   Wt 181 lb 6.4 oz (82.3 kg)   SpO2 96%   BMI 25.30 kg/m   Wt Readings from Last 3 Encounters:  01/06/20 181 lb 6.4 oz (82.3 kg)  12/21/19 186 lb (84.4 kg)  11/02/19 183 lb 9.6 oz (83.3 kg)    Physical Exam Vitals and nursing note reviewed.  Constitutional:      General: He is not in acute distress.    Appearance: Normal appearance.  HENT:     Mouth/Throat:     Mouth: Mucous membranes are moist.     Pharynx: Oropharynx is clear. No oropharyngeal exudate or posterior oropharyngeal erythema.  Eyes:     General: No scleral icterus.    Extraocular Movements: Extraocular movements intact.     Pupils: Pupils are equal, round, and reactive to light.  Cardiovascular:     Rate and Rhythm: Normal rate and regular rhythm.     Heart sounds: Normal heart sounds. No murmur heard.   Pulmonary:     Effort:  Pulmonary effort is normal. No respiratory distress.     Breath sounds: Normal breath sounds. No wheezing, rhonchi or rales.  Musculoskeletal:     Cervical back: Normal range of motion. No rigidity.  Lymphadenopathy:     Cervical: No cervical adenopathy.  Skin:    General: Skin is warm and dry.     Coloration: Skin is not jaundiced or pale.  Neurological:     General: No focal deficit present.     Mental Status: He is alert and oriented to person, place, and time.     Cranial Nerves: No cranial nerve deficit.     Motor: No weakness.     Gait: Gait normal.  Psychiatric:        Mood and Affect: Mood normal.        Behavior: Behavior normal.        Thought Content: Thought content normal.        Judgment: Judgment normal.    Results for orders placed or performed in visit on 01/06/20  CBC with Differential/Platelet  Result Value Ref  Range   WBC 7.6 3.4 - 10.8 x10E3/uL   RBC 4.61 4.14 - 5.80 x10E6/uL   Hemoglobin 14.7 13.0 - 17.7 g/dL   Hematocrit 16.5 53.7 - 51.0 %   MCV 97 79 - 97 fL   MCH 31.9 26.6 - 33.0 pg   MCHC 32.8 31 - 35 g/dL   RDW 48.2 70.7 - 86.7 %   Platelets 199 150 - 450 x10E3/uL   Neutrophils 69 Not Estab. %   Lymphs 20 Not Estab. %   Monocytes 8 Not Estab. %   Eos 3 Not Estab. %   Basos 0 Not Estab. %   Neutrophils Absolute 5.3 1 - 7 x10E3/uL   Lymphocytes Absolute 1.5 0 - 3 x10E3/uL   Monocytes Absolute 0.6 0 - 0 x10E3/uL   EOS (ABSOLUTE) 0.2 0.0 - 0.4 x10E3/uL   Basophils Absolute 0.0 0 - 0 x10E3/uL   Immature Granulocytes 0 Not Estab. %   Immature Grans (Abs) 0.0 0.0 - 0.1 x10E3/uL  Basic Metabolic Panel (BMET)  Result Value Ref Range   Glucose 102 (H) 65 - 99 mg/dL   BUN 24 8 - 27 mg/dL   Creatinine, Ser 5.44 (H) 0.76 - 1.27 mg/dL   GFR calc non Af Amer 39 (L) >59 mL/min/1.73   GFR calc Af Amer 45 (L) >59 mL/min/1.73   BUN/Creatinine Ratio 15 10 - 24   Sodium 137 134 - 144 mmol/L   Potassium 4.5 3.5 - 5.2 mmol/L   Chloride 101 96 - 106 mmol/L   CO2 24 20 - 29 mmol/L   Calcium 8.7 8.6 - 10.2 mg/dL      Assessment & Plan:   Problem List Items Addressed This Visit      Other   Hyperlipidemia    Chronic, ongoing.  LDL >150 in hospital and started on atorvastatin.  Continue on this medication for now.  BMP and CBC checked today.      Relevant Orders   CBC with Differential/Platelet (Completed)   Basic Metabolic Panel (BMET) (Completed)   Facial droop_right - Primary    Acute, ongoing, although patient reports this is improving.  Likely due to history of trigeminal neuralgia.  Not started on valtrex or prednisone in hospital, will not treat now as out of window.  Discussed aspiration precautions and continuing to ensure right eye stays hydrated.  Encouraged to keep appointment with PCP in about 1 month.  Relevant Orders   CBC with Differential/Platelet (Completed)   Basic  Metabolic Panel (BMET) (Completed)    Other Visit Diagnoses    Hospital discharge follow-up           Follow up plan: Return if symptoms worsen or fail to improve.

## 2020-01-07 LAB — CBC WITH DIFFERENTIAL/PLATELET
Basophils Absolute: 0 10*3/uL (ref 0.0–0.2)
Basos: 0 %
EOS (ABSOLUTE): 0.2 10*3/uL (ref 0.0–0.4)
Eos: 3 %
Hematocrit: 44.8 % (ref 37.5–51.0)
Hemoglobin: 14.7 g/dL (ref 13.0–17.7)
Immature Grans (Abs): 0 10*3/uL (ref 0.0–0.1)
Immature Granulocytes: 0 %
Lymphocytes Absolute: 1.5 10*3/uL (ref 0.7–3.1)
Lymphs: 20 %
MCH: 31.9 pg (ref 26.6–33.0)
MCHC: 32.8 g/dL (ref 31.5–35.7)
MCV: 97 fL (ref 79–97)
Monocytes Absolute: 0.6 10*3/uL (ref 0.1–0.9)
Monocytes: 8 %
Neutrophils Absolute: 5.3 10*3/uL (ref 1.4–7.0)
Neutrophils: 69 %
Platelets: 199 10*3/uL (ref 150–450)
RBC: 4.61 x10E6/uL (ref 4.14–5.80)
RDW: 14.2 % (ref 11.6–15.4)
WBC: 7.6 10*3/uL (ref 3.4–10.8)

## 2020-01-07 LAB — BASIC METABOLIC PANEL
BUN/Creatinine Ratio: 15 (ref 10–24)
BUN: 24 mg/dL (ref 8–27)
CO2: 24 mmol/L (ref 20–29)
Calcium: 8.7 mg/dL (ref 8.6–10.2)
Chloride: 101 mmol/L (ref 96–106)
Creatinine, Ser: 1.56 mg/dL — ABNORMAL HIGH (ref 0.76–1.27)
GFR calc Af Amer: 45 mL/min/{1.73_m2} — ABNORMAL LOW (ref >59)
GFR calc non Af Amer: 39 mL/min/{1.73_m2} — ABNORMAL LOW (ref >59)
Glucose: 102 mg/dL — ABNORMAL HIGH (ref 65–99)
Potassium: 4.5 mmol/L (ref 3.5–5.2)
Sodium: 137 mmol/L (ref 134–144)

## 2020-01-07 NOTE — Assessment & Plan Note (Signed)
Chronic, ongoing.  LDL >150 in hospital and started on atorvastatin.  Continue on this medication for now.  BMP and CBC checked today.

## 2020-01-07 NOTE — Assessment & Plan Note (Addendum)
Acute, ongoing, although patient reports this is improving.  Likely due to history of trigeminal neuralgia.  Not started on valtrex or prednisone in hospital, will not treat now as out of window.  Discussed aspiration precautions and continuing to ensure right eye stays hydrated.  Encouraged to keep appointment with PCP in about 1 month.

## 2020-01-24 ENCOUNTER — Ambulatory Visit: Payer: Medicare Other | Admitting: Nurse Practitioner

## 2020-01-31 ENCOUNTER — Other Ambulatory Visit: Payer: Self-pay | Admitting: Physician Assistant

## 2020-02-02 ENCOUNTER — Ambulatory Visit: Payer: Medicare Other | Admitting: Nurse Practitioner

## 2020-02-22 ENCOUNTER — Encounter: Payer: Self-pay | Admitting: Neurology

## 2020-02-22 ENCOUNTER — Other Ambulatory Visit: Payer: Self-pay

## 2020-02-22 ENCOUNTER — Ambulatory Visit (INDEPENDENT_AMBULATORY_CARE_PROVIDER_SITE_OTHER): Payer: Medicare Other | Admitting: Neurology

## 2020-02-22 VITALS — BP 128/78 | HR 65 | Ht 71.0 in | Wt 182.0 lb

## 2020-02-22 DIAGNOSIS — G510 Bell's palsy: Secondary | ICD-10-CM

## 2020-02-22 DIAGNOSIS — R29810 Facial weakness: Secondary | ICD-10-CM | POA: Diagnosis not present

## 2020-02-22 NOTE — Progress Notes (Signed)
[facility] [address] [place]. [place] [address] [phone]       OFFICE CONSULT NOTE  [name] Date of Birth:  05/23/1930 Medical Record Number:  [id number]   Referring MD:  Dorcas Carrow Reason for Referral: Facial weakness HPI: [name] is a pleasant 86 year old Caucasian male seen today for initial office consultation visit accompanied by his daughter Clydie Braun.  History is obtained from them and review of electronic medical records.  I personally reviewed pertinent imaging films that are available in PACS.  He has past medical history of hypertension, hyperlipidemia, pacemaker, ventricular tachycardia who developed sudden onset of right sided facial weakness in mid July 2021.  He presented to [facility] where he was admitted and initial CT scan of the head was done which showed no acute abnormality and it was repeated after 24 hours and again showed no abnormality and MRI could not be done because he had a incompatible pacemaker.  Carotid ultrasound showed only mild bilateral extracranial stenosis.  2D echo showed slightly diminished ejection fraction of 45 to 50% but no clots.  LDL cholesterol 151 mg percent and he was started on Lipitor 40 mg a day.  Hemoglobin A1c was 6.1.  Patient states he is done little bit better since then and has noticed slight improvement in his facial weakness with he is clear that his whole half of the face was affected including his forehead and eyelids.  He has been using artificial tears liberally during the day but does not use any ointment at night.  He did have some alteration of taste initially which appears to have improved.  He denies any hyperacusis.  There is no history of ear pain, infection or trauma.  He denies any ringing sound in his ears or hearing loss.  He has no prior history of strokes TIAs seizures.  He does have remote history of trigeminal neuralgia 40 years ago but is currently in remission and is not on  any medications for the same.  He has no new complaints today.  He does have remote history of shingles and also getting shingles vaccine.  He denies any painful vesicles in his right ear.  ROS:   14 system review of systems is positive for facial weakness, dryness of the eyes, redness of the eyes, itching, decreased hearing and all other systems negative PMH:  Past Medical History:  Diagnosis Date  . AAA (abdominal aortic aneurysm) without rupture (HCC) 03/12/2016   Small - measured 3.3 x 3.5 cm by CTA  . CKD (chronic kidney disease) stage 3, GFR 30-59 ml/min   . Essential hypertension   . Hyperlipidemia   . Hypertensive kidney disease   . Incidental pulmonary nodule 03/12/2016   Vague opacity right lung base requires radiographic follow up - index of suspicion is LOW  . Near syncope    Cardiogenic, related to an SVT and severe MR  . Non-sustained ventricular tachycardia (HCC)   . S/P minimally invasive mitral valve repair 03/19/2016   Complex valvuloplasty including triangular resection of flail segment of posterior leaflet, chordal transposition x1, artificial Gore-tex neochord placement x4 and 34 mm Sorin Memo 3D ring annuloplasty via right mini thoracotomy approach with clipping of LA appendage  . Severe mitral regurgitation by prior echocardiogram 02/23/2016   Confirmed by TEE  . Syncope 03/15/2016  . Tennis elbow left  . Thoracic aortic aneurysm (HCC) 03/12/2016   Very very mild fusiform enlargement of distal transverse and proximal descending thoracic aorta  noted on CTA  . Trigeminal neuralgia     Social History:  Social History   Socioeconomic History  . Marital status: Married    Spouse name: Not on file  . Number of children: Not on file  . Years of education: Not on file  . Highest education level: Associate degree: academic program  Occupational History  . Occupation: retired  Tobacco Use  . Smoking status: Never Smoker  . Smokeless tobacco: Never Used  Vaping  Use  . Vaping Use: Never used  Substance and Sexual Activity  . Alcohol use: No  . Drug use: No  . Sexual activity: Never  Other Topics Concern  . Not on file  Social History Narrative  . Not on file   Social Determinants of Health   Financial Resource Strain:   . Difficulty of Paying Living Expenses: Not on file  Food Insecurity:   . Worried About Programme researcher, broadcasting/film/video in the Last Year: Not on file  . Ran Out of Food in the Last Year: Not on file  Transportation Needs:   . Lack of Transportation (Medical): Not on file  . Lack of Transportation (Non-Medical): Not on file  Physical Activity:   . Days of Exercise per Week: Not on file  . Minutes of Exercise per Session: Not on file  Stress:   . Feeling of Stress : Not on file  Social Connections:   . Frequency of Communication with Friends and Family: Not on file  . Frequency of Social Gatherings with Friends and Family: Not on file  . Attends Religious Services: Not on file  . Active Member of Clubs or Organizations: Not on file  . Attends Banker Meetings: Not on file  . Marital Status: Not on file  Intimate Partner Violence:   . Fear of Current or Ex-Partner: Not on file  . Emotionally Abused: Not on file  . Physically Abused: Not on file  . Sexually Abused: Not on file    Medications:   Current Outpatient Medications on File Prior to Visit  Medication Sig Dispense Refill  . acetaminophen (TYLENOL) 325 MG tablet Take 325-650 mg by mouth every 6 (six) hours as needed for mild pain, moderate pain or fever.     Marland Kitchen albuterol (VENTOLIN HFA) 108 (90 Base) MCG/ACT inhaler Inhale 2 puffs into the lungs every 6 (six) hours as needed for wheezing or shortness of breath. 6.7 g 0  . amiodarone (PACERONE) 200 MG tablet TAKE 1 TABLET BY MOUTH EVERY DAY MONDAY THROUGH SATURDAY. DO NOT TAKE ON SUNDAY (Patient taking differently: Take 200 mg by mouth See admin instructions. Take 1 tablet (200mg ) by mouth every day Monday  through Saturday - do not take on Sunday) 90 tablet 3  . amLODipine (NORVASC) 2.5 MG tablet TAKE 1 TABLET(2.5 MG) BY MOUTH DAILY 90 tablet 0  . aspirin EC 81 MG tablet Take 1 tablet (81 mg total) by mouth daily.    . Multiple Vitamins-Minerals (MULTIVITAMIN ADULTS 50+ PO) Take 1 tablet by mouth daily.     No current facility-administered medications on file prior to visit.    Allergies:   Allergies  Allergen Reactions  . Amiodarone Other (See Comments)    Terrible dreams/nightmares in high dosing  . Lyrica [Pregabalin] Nausea And Vomiting    Physical Exam General: well developed, well nourished elderly Caucasian male, seated, in no evident distress Head: head normocephalic and atraumatic.   Neck: supple with no carotid or supraclavicular bruits Cardiovascular:  regular rate and rhythm, no murmurs Musculoskeletal: no deformity Skin:  no rash/petichiae Vascular:  Normal pulses all extremities  Neurologic Exam Mental Status: Awake and fully alert. Oriented to place and time. Recent and remote memory intact. Attention span, concentration and fund of knowledge appropriate. Mood and affect appropriate.  Cranial Nerves: Fundoscopic exam reveals sharp disc margins. Pupils equal, briskly reactive to light. Extraocular movements full without nystagmus. Visual fields full to confrontation. Hearing slightly diminished bilaterally facial sensation intact.  Moderate right facial weakness including forehead and eyelid closure with drooping of the right lower eyelid.  There is some redness of the lower eyelid.  Weakness of cheek and lower facial muscles as well.  No alteration of taste or hyperacusis., Tongue, palate moves normally and symmetrically.  Motor: Normal bulk and tone. Normal strength in all tested extremity muscles. Sensory.: intact to touch , pinprick , position and vibratory sensation.  Coordination: Rapid alternating movements normal in all extremities. Finger-to-nose and heel-to-shin  performed accurately bilaterally. Gait and Station: Arises from chair without difficulty. Stance is normal. Gait demonstrates normal stride length and balance . Able to heel, toe and tandem walk with moderate difficulty.  Reflexes: 1+ and symmetric. Toes downgoing.       ASSESSMENT: 86 year old Caucasian male with right facial weakness for last 2 months likely from Bell's palsy.    PLAN: I had a long discussion with the patient and his daughter regarding his right facial weakness which likely represents Bell's palsy.  I recommended to facial muscle exercises regularly as well as use artificial tears during the day and Lacri-Lube ointment at night to prevent infection.  I expect this to gradually improve over the next few months.  No further diagnostic testing is indicated at the present time.  Greater than 50% time during this 45-minute consultation visit was spent on counseling and coordination of care about his facial weakness and Bell's palsy and answering questions.  He will return for follow-up in the future only as needed and no schedule appointment was made. Delia Heady, MD  [facility] [address] [place], [place] [address]  Phone [phone] Fax [phone] Note: This document was prepared with digital dictation and possible smart phrase technology. Any transcriptional errors that result from this process are unintentional.

## 2020-02-22 NOTE — Patient Instructions (Signed)
I had a long discussion with the patient and his daughter regarding his right facial weakness which likely represents Bell's palsy.  I recommended to facial muscle exercises regularly as well as use artificial tears during the day and Lacri-Lube ointment at night to prevent infection.  I expect this to gradually improve over the next few months.  No further diagnostic testing is indicated at the present time.  He will return for follow-up in the future only as needed and no schedule appointment was made.  Bell Palsy, Adult  Bell palsy is a short-term inability to move muscles in part of the face. The inability to move (paralysis) results from inflammation or compression of the facial nerve, which travels along the skull and under the ear to the side of the face (7th cranial nerve). This nerve is responsible for facial movements that include blinking, closing the eyes, smiling, and frowning. What are the causes? The exact cause of this condition is not known. It may be caused by an infection from a virus, such as the chickenpox (herpes zoster), Epstein-Barr, or mumps virus. What increases the risk? You are more likely to develop this condition if:  You are pregnant.  You have diabetes.  You have had a recent infection in your nose, throat, or airways (upper respiratory infection).  You have a weakened body defense system (immune system).  You have had a facial injury, such as a fracture.  You have a family history of Bell palsy. What are the signs or symptoms? Symptoms of this condition include:  Weakness on one side of the face.  Drooping eyelid and corner of the mouth.  Excessive tearing in one eye.  Difficulty closing the eyelid.  Dry eye.  Drooling.  Dry mouth.  Changes in taste.  Change in facial appearance.  Pain behind one ear.  Ringing in one or both ears.  Sensitivity to sound in one ear.  Facial twitching.  Headache.  Impaired  speech.  Dizziness.  Difficulty eating or drinking. Most of the time, only one side of the face is affected. Rarely, Bell palsy affects the whole face. How is this diagnosed? This condition is diagnosed based on:  Your symptoms.  Your medical history.  A physical exam. You may also have to see health care providers who specialize in disorders of the nerves (neurologist) or diseases and conditions of the eye (ophthalmologist). You may have tests, such as:  A test to check for nerve damage (electromyogram).  Imaging studies, such as CT or MRI scans.  Blood tests. How is this treated? This condition affects every person differently. Sometimes symptoms go away without treatment within a couple weeks. If treatment is needed, it varies from person to person. The goal of treatment is to reduce inflammation and protect the eye from damage. Treatment for Bell palsy may include:  Medicines, such as: ? Steroids to reduce swelling and inflammation. ? Antiviral drugs. ? Pain relievers, including aspirin, acetaminophen, or ibuprofen.  Eye drops or ointment to keep your eye moist.  Eye protection, if you cannot close your eye.  Exercises or massage to regain muscle strength and function (physical therapy). Follow these instructions at home:   Take over-the-counter and prescription medicines only as told by your health care provider.  If your eye is affected: ? Keep your eye moist with eye drops or ointment as told by your health care provider. ? Follow instructions for eye care and protection as told by your health care provider.  Do any  physical therapy exercises as told by your health care provider.  Keep all follow-up visits as told by your health care provider. This is important. Contact a health care provider if:  You have a fever.  Your symptoms do not get better within 2-3 weeks, or your symptoms get worse.  Your eye is red, irritated, or painful.  You have new  symptoms. Get help right away if:  You have weakness or numbness in a part of your body other than your face.  You have trouble swallowing.  You develop neck pain or stiffness.  You develop dizziness or shortness of breath. Summary  Bell palsy is a short-term inability to move muscles in part of the face. The inability to move (paralysis) results from inflammation or compression of the facial nerve.  This condition affects every person differently. Sometimes symptoms go away without treatment within a couple weeks.  If treatment is needed, it varies from person to person. The goal of treatment is to reduce inflammation and protect the eye from damage.  Contact your health care provider if your symptoms do not get better within 2-3 weeks, or your symptoms get worse. This information is not intended to replace advice given to you by your health care provider. Make sure you discuss any questions you have with your health care provider. Document Revised: 05/02/2017 Document Reviewed: 07/23/2016 Elsevier Patient Education  2020 [employer].

## 2020-03-20 ENCOUNTER — Ambulatory Visit (INDEPENDENT_AMBULATORY_CARE_PROVIDER_SITE_OTHER): Payer: Medicare Other

## 2020-03-20 DIAGNOSIS — I472 Ventricular tachycardia, unspecified: Secondary | ICD-10-CM

## 2020-03-21 LAB — CUP PACEART REMOTE DEVICE CHECK
Battery Remaining Longevity: 45 mo
Battery Voltage: 2.97 V
Brady Statistic AP VP Percent: 78.96 %
Brady Statistic AP VS Percent: 0.04 %
Brady Statistic AS VP Percent: 20.82 %
Brady Statistic AS VS Percent: 0.17 %
Brady Statistic RA Percent Paced: 78.79 %
Brady Statistic RV Percent Paced: 99.66 %
Date Time Interrogation Session: 20211018001805
HighPow Impedance: 73 Ohm
Implantable Lead Implant Date: 20171025
Implantable Lead Implant Date: 20171025
Implantable Lead Location: 753859
Implantable Lead Location: 753860
Implantable Lead Model: 5076
Implantable Pulse Generator Implant Date: 20171025
Lead Channel Impedance Value: 342 Ohm
Lead Channel Impedance Value: 361 Ohm
Lead Channel Impedance Value: 418 Ohm
Lead Channel Pacing Threshold Amplitude: 0.625 V
Lead Channel Pacing Threshold Amplitude: 0.75 V
Lead Channel Pacing Threshold Pulse Width: 0.4 ms
Lead Channel Pacing Threshold Pulse Width: 0.4 ms
Lead Channel Sensing Intrinsic Amplitude: 2.375 mV
Lead Channel Sensing Intrinsic Amplitude: 2.375 mV
Lead Channel Sensing Intrinsic Amplitude: 5.5 mV
Lead Channel Sensing Intrinsic Amplitude: 5.5 mV
Lead Channel Setting Pacing Amplitude: 2 V
Lead Channel Setting Pacing Amplitude: 2.5 V
Lead Channel Setting Pacing Pulse Width: 0.4 ms
Lead Channel Setting Sensing Sensitivity: 0.3 mV

## 2020-03-24 NOTE — Progress Notes (Signed)
Remote ICD transmission.   

## 2020-04-05 ENCOUNTER — Telehealth: Payer: Self-pay | Admitting: Internal Medicine

## 2020-04-05 NOTE — Telephone Encounter (Signed)
Returning patients phone call. Patients remote monitor  Box is showing a low battery. Patient advised he will need to call [employer] for assistance of battery. Phone number provided. Patient advised to call DC back if he has questions. Verbalized understanding.

## 2020-04-05 NOTE — Telephone Encounter (Signed)
Patient states that his device battery is low. He would like to know what he needs to do. Please advise.

## 2020-04-12 ENCOUNTER — Encounter: Payer: Self-pay | Admitting: Internal Medicine

## 2020-04-12 ENCOUNTER — Other Ambulatory Visit: Payer: Self-pay

## 2020-04-12 ENCOUNTER — Ambulatory Visit (INDEPENDENT_AMBULATORY_CARE_PROVIDER_SITE_OTHER): Payer: Medicare Other | Admitting: Internal Medicine

## 2020-04-12 VITALS — BP 118/70 | HR 69 | Ht 71.0 in | Wt 182.0 lb

## 2020-04-12 DIAGNOSIS — I10 Essential (primary) hypertension: Secondary | ICD-10-CM

## 2020-04-12 DIAGNOSIS — I472 Ventricular tachycardia, unspecified: Secondary | ICD-10-CM

## 2020-04-12 DIAGNOSIS — I340 Nonrheumatic mitral (valve) insufficiency: Secondary | ICD-10-CM

## 2020-04-12 DIAGNOSIS — I5022 Chronic systolic (congestive) heart failure: Secondary | ICD-10-CM | POA: Diagnosis not present

## 2020-04-12 NOTE — Patient Instructions (Addendum)
Medication Instructions:  Your physician recommends that you continue on your current medications as directed. Please refer to the Current Medication list given to you today.  *If you need a refill on your cardiac medications before your next appointment, please call your pharmacy*  Follow-Up: At [facility], you and your health needs are our priority.  As part of our continuing mission to provide you with exceptional heart care, we have created designated Provider Care Teams.  These Care Teams include your primary Cardiologist (physician) and Advanced Practice Providers (APPs -  Physician Assistants and Nurse Practitioners) who all work together to provide you with the care you need, when you need it.  We recommend signing up for the patient portal called "MyChart".  Sign up information is provided on this After Visit Summary.  MyChart is used to connect with patients for Virtual Visits (Telemedicine).  Patients are able to view lab/test results, encounter notes, upcoming appointments, etc.  Non-urgent messages can be sent to your provider as well.   To learn more about what you can do with MyChart, go to [email or web address].    Your next appointment:   6 month(s)  The format for your next appointment:   In Person  Provider:   You may see DR Cristal Deer END or one of the following Advanced Practice Providers on your designated Care Team:    Nicolasa Ducking, NP  Eula Listen, PA-C  Marisue Ivan, PA-C  Cadence Fransico Michael, [place]   Please schedule patient for follow up with Dr Ladona Ridgel for in mid-January.

## 2020-04-14 ENCOUNTER — Encounter: Payer: Self-pay | Admitting: Internal Medicine

## 2020-04-14 NOTE — Progress Notes (Signed)
Follow-up Outpatient Visit Date: 04/12/2020  Primary Care Provider: Marjie Skiff, NP [address] [place] [address]  Chief Complaint: Follow-up heart failure  HPI:  [name] is a 86 y.o. 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  I last saw [name], at which time he was doing well.  He was hospitalized in June with pneumonia but recovered well from this.  In July, he presented to the emergency department with acute onset of right facial droop and was hospitalized for stroke work-up.  He was ultimately diagnosed with Bell's palsy and has been seen by neurology as an outpatient.  No further intervention was recommended; he was advised it may take several months for his facial weakness to resolve.  He feels like it is slowly getting better.  From a heart standpoint, [name] has been doing well.  He denies chest pain, shortness of breath, palpitations, lightheadedness, and edema.  He continues to perform his usual activities, including hunting and working in his garden, without limitations.  His ICD remote monitor is currently malfunctioning; he is awaiting a replacement from [employer].  Echocardiogram during hospitalization for possible stroke showed improved LV systolic function to 45-50% with mild mitral regurgitation.  --------------------------------------------------------------------------------------------------  Cardiovascular History & Procedures: Cardiovascular Problems:  Severe mitral regurgitation status post repair (03/2016)  Chronic systolic heart failure secondary to nonischemic cardiomyopathy  Frequent ventricular ectopy with sustained  ventricular tachycardia status post ICD  Nonobstructive coronary artery disease  Risk Factors:  Age, male gender, hypertension, and hyperlipidemia  Cath/PCI:  LHC/RHC (02/23/16): Mild, nonobstructive CAD (30% ostial LMCA, 30% ostial LAD, 30% ostial ramus, and minimal luminal irregularities of dominant LCx. Normal right heart filling pressures. Decreased Fick cardiac output (CO 3.6 L/m, CI 1.8 L/m/m) in setting of frequent PVCs and severe MR.  CV Surgery:  Minimally invasive mitral valve repair (03/19/16, Dr. Cornelius Moras)  EP Procedures and Devices:  24-hour Holter monitor (02/27/16): Predominantly sinus rhythm with average heart rate is 63 bpm (range 49-91 bpm). Longest RR interval 1.6 seconds. Frequent PVCs noted (11% burden) including frequent couplets and bigeminal cycles. 90 runs of NSVT were noted lasting up to 9 beats the maximum rate of 134 bpm. Rare SVT and supraventricular ectopy noted.  Dual chamber ICD (03/27/16, [employer])  Non-Invasive Evaluation(s):  Left arm venous Duplex (07/06/19): No evidence of DVT.  TTE (04/25/17): Mildly dilated LV with moderate LVH. LVEF 25-30% with diffuse hypokinesis. Aortic sclerosis. Moderately elevated transmitral gradient status post repair. Moderate mitral regurgitation. Mild to moderate left atrial enlargement. Mildly reduced RV contraction.  Limited TTE (02/11/17): LVEF 25-30% with probable akinesis of the inferolateral and inferior myocardium. Grade 2 diastolic dysfunction. Mild LA enlargement.  TTE (09/03/16): Moderately dilated left ventricle with LVEF less than 20% and global hypokinesis. Prior surgical repair of mitral valve is evident with moderate regurgitation. Left atrium is moderately dilated. RV size and function are normal. Moderate pulmonary hypertension noted with RVSP of 54 mmHg.  Limited TTE (03/22/16): Normal LV size with moderate LVH and LVEF of 40-45% with possible inferior hypokinesis and incoordinate septal motion. Mitral  angioplasty ring in place with trivial MR. No significant mitral stenosis. Mild left atrial enlargement. Normal RV was moderately reduced systolic function. RVSP 41 mm or mercury. Elevated central venous pressure.  TTE (02/14/16):  Normal LV size with moderate LVH. EF mildly reduced at 50% with possible inferolateral hypokinesis. Moderate to severe MR noted with prolapse/flail of the posterior leaflet. Severe left atrial enlargement. Moderate TR.  TEE (02/23/16): Normal LV size and wall thickness with lower normal LVEF. Trivial AI. Severe, eccentric MR with reversal of pulmonary vein flow and flail posterior leaflet. No left atrial thrombus. Lipomatous hypertrophy of the septum. Mild to moderate TR.  Limited TTE (03/22/16): Normal LV size with moderate LVH. LVEF 40-45% with possible inferior hypokinesis. Trivial MR with mild left atrial enlargement. Normal RV size with moderately reduced systolic function. Mild TR. No pericardial effusion.  Recent CV Pertinent Labs: Lab Results  Component Value Date   CHOL 225 (H) 12/22/2019   CHOL 188 07/29/2019   HDL 41 12/22/2019   HDL 40 01/20/2019   LDLCALC 151 (H) 12/22/2019   LDLCALC 112 (H) 01/20/2019   TRIG 165 (H) 12/22/2019   TRIG 320 (H) 07/29/2019   CHOLHDL 5.5 12/22/2019   INR 1.0 12/21/2019   BNP 149.0 (H) 12/21/2019   K 4.5 01/06/2020   MG 2.5 (H) 10/24/2019   BUN 24 01/06/2020   CREATININE 1.56 (H) 01/06/2020    Past medical and surgical history were reviewed and updated in EPIC.  Current Meds  Medication Sig   acetaminophen (TYLENOL) 325 MG tablet Take 325-650 mg by mouth every 6 (six) hours as needed for mild pain, moderate pain or fever.    albuterol (VENTOLIN HFA) 108 (90 Base) MCG/ACT inhaler Inhale 2 puffs into the lungs every 6 (six) hours as needed for wheezing or shortness of breath.   amiodarone (PACERONE) 200 MG tablet TAKE 1 TABLET BY MOUTH EVERY DAY MONDAY THROUGH SATURDAY. DO NOT TAKE ON SUNDAY (Patient taking  differently: Take 200 mg by mouth See admin instructions. Take 1 tablet (200mg ) by mouth every day Monday through Saturday - do not take on Sunday)   amLODipine (NORVASC) 2.5 MG tablet TAKE 1 TABLET(2.5 MG) BY MOUTH DAILY   aspirin EC 81 MG tablet Take 1 tablet (81 mg total) by mouth daily.   Multiple Vitamins-Minerals (MULTIVITAMIN ADULTS 50+ PO) Take 1 tablet by mouth daily.    Allergies: Amiodarone and Lyrica [pregabalin]  Social History   Tobacco Use   Smoking status: Never Smoker   Smokeless tobacco: Never Used  Vaping Use   Vaping Use: Never used  Substance Use Topics   Alcohol use: No   Drug use: No    Family History  Problem Relation Age of Onset   Hypertension Mother    Stroke Mother    Heart disease Father    Heart attack Father    Dementia Sister    Retinoblastoma Son    COPD Neg Hx    Cancer Neg Hx    Diabetes Neg Hx     Review of Systems: A 12-system review of systems was performed and was negative except as noted in the HPI.  --------------------------------------------------------------------------------------------------  Physical Exam: BP 118/70 (BP Location: Left Arm, Patient Position: Sitting, Cuff Size: Normal)    Pulse 69    Ht 5\' 11"  (1.803 m)    Wt 182 lb (82.6 kg)    SpO2 94%    BMI 25.38 kg/m   General: NAD. HEENT: Right facial droop noted. Neck: No JVD or HJR. Lungs: Normal work of breathing. Clear to auscultation bilaterally without wheezes or crackles. Heart: Regular rate and rhythm with 1/6 systolic murmur. Abd: Bowel sounds present. Soft, NT/ND. Ext: No lower extremity  edema..  EKG: AV paced rhythm.  Lab Results  Component Value Date   WBC 7.6 01/06/2020   HGB 14.7 01/06/2020   HCT 44.8 01/06/2020   MCV 97 01/06/2020   PLT 199 01/06/2020    Lab Results  Component Value Date   NA 137 01/06/2020   K 4.5 01/06/2020   CL 101 01/06/2020   CO2 24 01/06/2020   BUN 24 01/06/2020   CREATININE 1.56 (H) 01/06/2020    GLUCOSE 102 (H) 01/06/2020   ALT 18 12/21/2019    Lab Results  Component Value Date   CHOL 225 (H) 12/22/2019   HDL 41 12/22/2019   LDLCALC 151 (H) 12/22/2019   TRIG 165 (H) 12/22/2019   CHOLHDL 5.5 12/22/2019    --------------------------------------------------------------------------------------------------  ASSESSMENT AND PLAN: Chronic HFrEF: [name] appears euvolemic with NYHA class I symptoms.  Despite not being on any evidence-based heart failure therapy, his LVEF has improved to 45-50% on most recent echocardiogram in July.  He has been intolerant to beta-blockers, ACE inhibitors, ARB's, and aldosterone antagonist in the past.  We will therefore defer rechallenging him with any of these agents.  Mitral regurgitation status post mitral valve repair: [name] appears euvolemic and well compensated.  Most recent echo showed only mild mitral regurgitation without evidence of stenosis.  Ventricular tachycardia: No symptoms to suggest sustained VT.  Continue routine follow-up with the device clinic.  Continue amiodarone with ongoing follow-up/monitoring by Dr. Ladona Ridgel.  We will try to facilitate appointment with Dr. Ladona Ridgel in January.  Follow-up: Return to clinic in 6 months.  [name], MD 04/14/2020 7:21 AM

## 2020-04-27 ENCOUNTER — Other Ambulatory Visit: Payer: Self-pay | Admitting: Physician Assistant

## 2020-05-01 NOTE — Telephone Encounter (Signed)
Rx request sent to pharmacy.  

## 2020-06-19 ENCOUNTER — Ambulatory Visit (INDEPENDENT_AMBULATORY_CARE_PROVIDER_SITE_OTHER): Payer: Medicare Other

## 2020-06-19 DIAGNOSIS — I472 Ventricular tachycardia, unspecified: Secondary | ICD-10-CM

## 2020-06-21 LAB — CUP PACEART REMOTE DEVICE CHECK
Battery Remaining Longevity: 39 mo
Battery Voltage: 2.97 V
Brady Statistic AP VP Percent: 85.8 %
Brady Statistic AP VS Percent: 0.01 %
Brady Statistic AS VP Percent: 14.16 %
Brady Statistic AS VS Percent: 0.02 %
Brady Statistic RA Percent Paced: 85.59 %
Brady Statistic RV Percent Paced: 99.88 %
Date Time Interrogation Session: 20220117033525
HighPow Impedance: 66 Ohm
Implantable Lead Implant Date: 20171025
Implantable Lead Implant Date: 20171025
Implantable Lead Location: 753859
Implantable Lead Location: 753860
Implantable Lead Model: 5076
Implantable Pulse Generator Implant Date: 20171025
Lead Channel Impedance Value: 342 Ohm
Lead Channel Impedance Value: 361 Ohm
Lead Channel Impedance Value: 399 Ohm
Lead Channel Pacing Threshold Amplitude: 0.625 V
Lead Channel Pacing Threshold Amplitude: 0.625 V
Lead Channel Pacing Threshold Pulse Width: 0.4 ms
Lead Channel Pacing Threshold Pulse Width: 0.4 ms
Lead Channel Sensing Intrinsic Amplitude: 1.875 mV
Lead Channel Sensing Intrinsic Amplitude: 1.875 mV
Lead Channel Sensing Intrinsic Amplitude: 5.5 mV
Lead Channel Sensing Intrinsic Amplitude: 5.5 mV
Lead Channel Setting Pacing Amplitude: 2 V
Lead Channel Setting Pacing Amplitude: 2.5 V
Lead Channel Setting Pacing Pulse Width: 0.4 ms
Lead Channel Setting Sensing Sensitivity: 0.3 mV

## 2020-07-04 NOTE — Progress Notes (Signed)
Remote ICD transmission.   

## 2020-07-06 ENCOUNTER — Ambulatory Visit (INDEPENDENT_AMBULATORY_CARE_PROVIDER_SITE_OTHER): Payer: Medicare Other | Admitting: Internal Medicine

## 2020-07-06 ENCOUNTER — Encounter: Payer: Self-pay | Admitting: Internal Medicine

## 2020-07-06 ENCOUNTER — Other Ambulatory Visit: Payer: Self-pay

## 2020-07-06 VITALS — BP 110/60 | HR 66 | Ht 71.0 in | Wt 179.6 lb

## 2020-07-06 DIAGNOSIS — Z8679 Personal history of other diseases of the circulatory system: Secondary | ICD-10-CM

## 2020-07-06 DIAGNOSIS — I428 Other cardiomyopathies: Secondary | ICD-10-CM | POA: Diagnosis not present

## 2020-07-06 DIAGNOSIS — Z95810 Presence of automatic (implantable) cardiac defibrillator: Secondary | ICD-10-CM | POA: Diagnosis not present

## 2020-07-06 NOTE — Patient Instructions (Signed)
Medication Instructions:  Your physician recommends that you continue on your current medications as directed. Please refer to the Current Medication list given to you today.  Labwork: None ordered.  Testing/Procedures: None ordered.  Follow-Up: Your physician wants you to follow-up in: one year with Dr. Ladona Ridgel.   You will receive a reminder letter in the mail two months in advance. If you don't receive a letter, please call our office to schedule the follow-up appointment.  Remote monitoring is used to monitor your ICD from home. This monitoring reduces the number of office visits required to check your device to one time per year. It allows [place] to keep an eye on the functioning of your device to ensure it is working properly. You are scheduled for a device check from home on 09/18/2020. You may send your transmission at any time that day. If you have a wireless device, the transmission will be sent automatically. After your physician reviews your transmission, you will receive a postcard with your next transmission date.  Any Other Special Instructions Will Be Listed Below (If Applicable).  If you need a refill on your cardiac medications before your next appointment, please call your pharmacy.

## 2020-07-06 NOTE — Progress Notes (Signed)
HPI Mr. Gullion returns today for followup. He has a h/o MR, s/p repair, non-ischemic CM, and CHB. He has VT and we had a difficult time controlling until we got him on amiodarone. He has done well in the interim though hd did have a possible stroke. A repeat echo at that time demonstrated improved LV function. He has not had any ICD therapies. He is active fishing and hunting.  Allergies  Allergen Reactions  . Amiodarone Other (See Comments)    Terrible dreams/nightmares in high dosing  . Lyrica [Pregabalin] Nausea And Vomiting     Current Outpatient Medications  Medication Sig Dispense Refill  . acetaminophen (TYLENOL) 325 MG tablet Take 325-650 mg by mouth every 6 (six) hours as needed for mild pain, moderate pain or fever.     Marland Kitchen albuterol (VENTOLIN HFA) 108 (90 Base) MCG/ACT inhaler Inhale 2 puffs into the lungs every 6 (six) hours as needed for wheezing or shortness of breath. 6.7 g 0  . amiodarone (PACERONE) 200 MG tablet TAKE 1 TABLET BY MOUTH EVERY DAY MONDAY THROUGH SATURDAY. DO NOT TAKE ON SUNDAY 90 tablet 3  . amLODipine (NORVASC) 2.5 MG tablet TAKE 1 TABLET(2.5 MG) BY MOUTH DAILY 90 tablet 1  . aspirin EC 81 MG tablet Take 1 tablet (81 mg total) by mouth daily.    . Multiple Vitamins-Minerals (MULTIVITAMIN ADULTS 50+ PO) Take 1 tablet by mouth daily.     No current facility-administered medications for this visit.     Past Medical History:  Diagnosis Date  . AAA (abdominal aortic aneurysm) without rupture (HCC) 03/12/2016   Small - measured 3.3 x 3.5 cm by CTA  . CKD (chronic kidney disease) stage 3, GFR 30-59 ml/min (HCC)   . Essential hypertension   . Hyperlipidemia   . Hypertensive kidney disease   . Incidental pulmonary nodule 03/12/2016   Vague opacity right lung base requires radiographic follow up - index of suspicion is LOW  . Near syncope    Cardiogenic, related to an SVT and severe MR  . Non-sustained ventricular tachycardia (HCC)   . S/P minimally  invasive mitral valve repair 03/19/2016   Complex valvuloplasty including triangular resection of flail segment of posterior leaflet, chordal transposition x1, artificial Gore-tex neochord placement x4 and 34 mm Sorin Memo 3D ring annuloplasty via right mini thoracotomy approach with clipping of LA appendage  . Severe mitral regurgitation by prior echocardiogram 02/23/2016   Confirmed by TEE  . Syncope 03/15/2016  . Tennis elbow left  . Thoracic aortic aneurysm (HCC) 03/12/2016   Very very mild fusiform enlargement of distal transverse and proximal descending thoracic aorta noted on CTA  . Trigeminal neuralgia     ROS:   All systems reviewed and negative except as noted in the HPI.   Past Surgical History:  Procedure Laterality Date  . CARDIAC CATHETERIZATION N/A 02/23/2016   Procedure: Right/Left Heart Cath and Coronary Angiography;  Surgeon: [name], MD;  Location: [facility] INVASIVE CV LAB;  Service: Cardiovascular: Angiographic minimal coronary disease. Normal left and right heart Pressures. Decreased CO/CI in settng of frequent PVCs & Severe MR.  Marland Kitchen CATARACT EXTRACTION, BILATERAL    . COLONOSCOPY  08/2005  . CYST EXCISION  10/2013   from neck  . EP IMPLANTABLE DEVICE N/A 03/27/2016   Procedure: ICD Implant Dual Chamber;  Surgeon: Marinus Maw, MD;  Location: [facility] INVASIVE CV LAB;  Service: Cardiovascular;  Laterality: N/A;  . ESOPHAGOGASTRODUODENOSCOPY  02/2010  . MITRAL  VALVE REPAIR Right 03/19/2016   Procedure: MINIMALLY INVASIVE MITRAL VALVE REPAIR (MVR);  Surgeon: [name], MD;  Location: [facility] OR;  Service: Open Heart Surgery;  Laterality: Right;  . TEE WITHOUT CARDIOVERSION N/A 02/23/2016   Procedure: TRANSESOPHAGEAL ECHOCARDIOGRAM (TEE);  Surgeon: Quintella Reichert, MD;  Location: [facility] ENDOSCOPY;  Service: Cardiovascular: Normal LV size and function.  Degenerative mitral valve disease with failed posterior leaflet (P2 segment), Severe MR with pulmonary vein systolic flow  reversal. Moderate-TR.    . TEE WITHOUT CARDIOVERSION N/A 03/19/2016   Procedure: TRANSESOPHAGEAL ECHOCARDIOGRAM (TEE);  Surgeon: [name], MD;  Location: [facility] OR;  Service: Open Heart Surgery;  Laterality: N/A;  . TRANSTHORACIC ECHOCARDIOGRAM  02/14/2016   EF 50-55% with moderate LVH. Possible inferolateral hypokinesis. Moderate-severe MR with posterior leaflet prolapse, cannot rule out flail. Severe LA dilation. Moderate TR.     Family History  Problem Relation Age of Onset  . Hypertension Mother   . Stroke Mother   . Heart disease Father   . Heart attack Father   . Dementia Sister   . Retinoblastoma Son   . COPD Neg Hx   . Cancer Neg Hx   . Diabetes Neg Hx      Social History   Socioeconomic History  . Marital status: Married    Spouse name: Not on file  . Number of children: Not on file  . Years of education: Not on file  . Highest education level: Associate degree: academic program  Occupational History  . Occupation: retired  Tobacco Use  . Smoking status: Never Smoker  . Smokeless tobacco: Never Used  Vaping Use  . Vaping Use: Never used  Substance and Sexual Activity  . Alcohol use: No  . Drug use: No  . Sexual activity: Never  Other Topics Concern  . Not on file  Social History Narrative  . Not on file   Social Determinants of Health   Financial Resource Strain: Not on file  Food Insecurity: Not on file  Transportation Needs: Not on file  Physical Activity: Not on file  Stress: Not on file  Social Connections: Not on file  Intimate Partner Violence: Not on file     BP 110/60   Pulse 66   Ht 5\' 11"  (1.803 m)   Wt 179 lb 9.6 oz (81.5 kg)   SpO2 92%   BMI 25.05 kg/m   Physical Exam:  Well appearing elderly man, NAD HEENT: Unremarkable Neck:  No JVD, no thyromegally Lymphatics:  No adenopathy Back:  No CVA tenderness Lungs:  Clear with no wheezes HEART:  Regular rate rhythm, no murmurs, no rubs, no clicks Abd:  soft, positive bowel  sounds, no organomegally, no rebound, no guarding Ext:  2 plus pulses, no edema, no cyanosis, no clubbing Skin:  No rashes no nodules Neuro:  CN II through XII intact, motor grossly intact  EKG - nsr with LBBB  DEVICE  Normal device function.  See PaceArt for details.   Assess/Plan: 1. VT - he has had no VT since his last visit on amiodarone. He will continue 200 mg daily, none on Sunday. 2. Chronic systolic heart failure - his symptoms are class 2. He has refused upgrade to a Biv which is reasonable while his CHF is controlled. He is encouraged to maintain a low sodium diet. I would anticipating adding an LV lead when he reaches ERI in 3 years. 3. ICD - his Medtronic DDD ICD is working normally. We will recheck  in several months.  4. MR - he has none on exam. He will undergo watchful waiting.   Sharlot Gowda Tiana Sivertson,MD

## 2020-07-17 ENCOUNTER — Ambulatory Visit (INDEPENDENT_AMBULATORY_CARE_PROVIDER_SITE_OTHER): Payer: Medicare Other

## 2020-07-17 VITALS — Ht 71.0 in | Wt 180.0 lb

## 2020-07-17 DIAGNOSIS — Z0000 Encounter for general adult medical examination without abnormal findings: Secondary | ICD-10-CM

## 2020-07-17 NOTE — Progress Notes (Signed)
I connected with Roy Palmer today by telephone and verified that I am speaking with the correct person using two identifiers. Location patient: home Location provider: work Persons participating in the virtual visit: Roy BellingJohn Knab, Elisha PonderNickeah Terrah Decoster LPN.   I discussed the limitations, risks, security and privacy concerns of performing an evaluation and management service by telephone and the availability of in person appointments. I also discussed with the patient that there may be a patient responsible charge related to this service. The patient expressed understanding and verbally consented to this telephonic visit.    Interactive audio and video telecommunications were attempted between this provider and patient, however failed, due to patient having technical difficulties OR patient did not have access to video capability.  We continued and completed visit with audio only.     Vital signs may be patient reported or missing.  Subjective:   Wynona NeatJohn A Gayler is a 87 y.o. male who presents for Medicare Annual/Subsequent preventive examination.  Review of Systems     Cardiac Risk Factors include: advanced age (>1555men, 24>65 women);dyslipidemia;hypertension;male gender     Objective:    Today's Vitals   07/17/20 1257  Weight: 180 lb (81.6 kg)  Height: 5\' 11"  (1.803 m)   Body mass index is 25.1 kg/m.  Advanced Directives 07/17/2020 12/21/2019 12/21/2019 10/21/2019 10/20/2019 07/14/2019 07/08/2018  Does Patient Have a Medical Advance Directive? Yes No No Yes Yes Yes Yes  Type of Estate agentAdvance Directive Healthcare Power of PierceAttorney;Living will - - Healthcare Power of Attorney Living will;Healthcare Power of Attorney Living will;Healthcare Power of Attorney Living will  Does patient want to make changes to medical advance directive? - - - No - Patient declined No - Patient declined - -  Copy of Healthcare Power of Attorney in Chart? No - copy requested - - No - copy requested No - copy requested No -  copy requested -  Would patient like information on creating a medical advance directive? - No - Patient declined No - Patient declined - - - -    Current Medications (verified) Outpatient Encounter Medications as of 07/17/2020  Medication Sig  . acetaminophen (TYLENOL) 325 MG tablet Take 325-650 mg by mouth every 6 (six) hours as needed for mild pain, moderate pain or fever.   Marland Kitchen. albuterol (VENTOLIN HFA) 108 (90 Base) MCG/ACT inhaler Inhale 2 puffs into the lungs every 6 (six) hours as needed for wheezing or shortness of breath.  Marland Kitchen. amiodarone (PACERONE) 200 MG tablet TAKE 1 TABLET BY MOUTH EVERY DAY MONDAY THROUGH SATURDAY. DO NOT TAKE ON SUNDAY  . amLODipine (NORVASC) 2.5 MG tablet TAKE 1 TABLET(2.5 MG) BY MOUTH DAILY  . aspirin EC 81 MG tablet Take 1 tablet (81 mg total) by mouth daily.  . Multiple Vitamins-Minerals (MULTIVITAMIN ADULTS 50+ PO) Take 1 tablet by mouth daily.   No facility-administered encounter medications on file as of 07/17/2020.    Allergies (verified) Amiodarone and Lyrica [pregabalin]   History: Past Medical History:  Diagnosis Date  . AAA (abdominal aortic aneurysm) without rupture (HCC) 03/12/2016   Small - measured 3.3 x 3.5 cm by CTA  . CKD (chronic kidney disease) stage 3, GFR 30-59 ml/min (HCC)   . Essential hypertension   . Hyperlipidemia   . Hypertensive kidney disease   . Incidental pulmonary nodule 03/12/2016   Vague opacity right lung base requires radiographic follow up - index of suspicion is LOW  . Near syncope    Cardiogenic, related to an SVT and severe MR  .  Non-sustained ventricular tachycardia (HCC)   . S/P minimally invasive mitral valve repair 03/19/2016   Complex valvuloplasty including triangular resection of flail segment of posterior leaflet, chordal transposition x1, artificial Gore-tex neochord placement x4 and 34 mm Sorin Memo 3D ring annuloplasty via right mini thoracotomy approach with clipping of LA appendage  . Severe mitral  regurgitation by prior echocardiogram 02/23/2016   Confirmed by TEE  . Syncope 03/15/2016  . Tennis elbow left  . Thoracic aortic aneurysm (HCC) 03/12/2016   Very very mild fusiform enlargement of distal transverse and proximal descending thoracic aorta noted on CTA  . Trigeminal neuralgia    Past Surgical History:  Procedure Laterality Date  . CARDIAC CATHETERIZATION N/A 02/23/2016   Procedure: Right/Left Heart Cath and Coronary Angiography;  Surgeon: [name], MD;  Location: [facility] INVASIVE CV LAB;  Service: Cardiovascular: Angiographic minimal coronary disease. Normal left and right heart Pressures. Decreased CO/CI in settng of frequent PVCs & Severe MR.  Marland Kitchen CATARACT EXTRACTION, BILATERAL    . COLONOSCOPY  08/2005  . CYST EXCISION  10/2013   from neck  . EP IMPLANTABLE DEVICE N/A 03/27/2016   Procedure: ICD Implant Dual Chamber;  Surgeon: Marinus Maw, MD;  Location: [facility] INVASIVE CV LAB;  Service: Cardiovascular;  Laterality: N/A;  . ESOPHAGOGASTRODUODENOSCOPY  02/2010  . MITRAL VALVE REPAIR Right 03/19/2016   Procedure: MINIMALLY INVASIVE MITRAL VALVE REPAIR (MVR);  Surgeon: [name], MD;  Location: [facility] OR;  Service: Open Heart Surgery;  Laterality: Right;  . TEE WITHOUT CARDIOVERSION N/A 02/23/2016   Procedure: TRANSESOPHAGEAL ECHOCARDIOGRAM (TEE);  Surgeon: Quintella Reichert, MD;  Location: [facility] ENDOSCOPY;  Service: Cardiovascular: Normal LV size and function.  Degenerative mitral valve disease with failed posterior leaflet (P2 segment), Severe MR with pulmonary vein systolic flow reversal. Moderate-TR.    . TEE WITHOUT CARDIOVERSION N/A 03/19/2016   Procedure: TRANSESOPHAGEAL ECHOCARDIOGRAM (TEE);  Surgeon: [name], MD;  Location: [facility] OR;  Service: Open Heart Surgery;  Laterality: N/A;  . TRANSTHORACIC ECHOCARDIOGRAM  02/14/2016   EF 50-55% with moderate LVH. Possible inferolateral hypokinesis. Moderate-severe MR with posterior leaflet prolapse, cannot rule out flail. Severe  LA dilation. Moderate TR.   Family History  Problem Relation Age of Onset  . Hypertension Mother   . Stroke Mother   . Heart disease Father   . Heart attack Father   . Dementia Sister   . Retinoblastoma Son   . COPD Neg Hx   . Cancer Neg Hx   . Diabetes Neg Hx    Social History   Socioeconomic History  . Marital status: Married    Spouse name: Not on file  . Number of children: Not on file  . Years of education: Not on file  . Highest education level: Associate degree: academic program  Occupational History  . Occupation: retired  Tobacco Use  . Smoking status: Never Smoker  . Smokeless tobacco: Never Used  Vaping Use  . Vaping Use: Never used  Substance and Sexual Activity  . Alcohol use: No  . Drug use: No  . Sexual activity: Not Currently  Other Topics Concern  . Not on file  Social History Narrative  . Not on file   Social Determinants of Health   Financial Resource Strain: Low Risk   . Difficulty of Paying Living Expenses: Not hard at all  Food Insecurity: No Food Insecurity  . Worried About Programme researcher, broadcasting/film/video in the Last Year: Never true  . Ran Out of  Food in the Last Year: Never true  Transportation Needs: No Transportation Needs  . Lack of Transportation (Medical): No  . Lack of Transportation (Non-Medical): No  Physical Activity: Insufficiently Active  . Days of Exercise per Week: 7 days  . Minutes of Exercise per Session: 20 min  Stress: No Stress Concern Present  . Feeling of Stress : Not at all  Social Connections: Not on file    Tobacco Counseling Counseling given: Not Answered   Clinical Intake:  Pre-visit preparation completed: Yes  Pain : No/denies pain     Nutritional Status: BMI 25 -29 Overweight Nutritional Risks: None Diabetes: No  How often do you need to have someone help you when you read instructions, pamphlets, or other written materials from your doctor or pharmacy?: 1 - Never What is the last grade level you  completed in school?: 74yrs college  Diabetic? No   Interpreter Needed?: No  Information entered by :: NAllen LPN   Activities of Daily Living In your present state of health, do you have any difficulty performing the following activities: 07/17/2020 12/21/2019  Hearing? N N  Vision? N N  Difficulty concentrating or making decisions? N N  Walking or climbing stairs? N N  Dressing or bathing? N N  Doing errands, shopping? N N  Preparing Food and eating ? N -  Using the Toilet? N -  In the past six months, have you accidently leaked urine? N -  Do you have problems with loss of bowel control? N -  Managing your Medications? N -  Managing your Finances? N -  Housekeeping or managing your Housekeeping? N -  Some recent data might be hidden    Patient Care Team: Marjie Skiff, NP as PCP - General (Nurse Practitioner) End, Cristal Deer, MD as Consulting Physician (Cardiology) Marinus Maw, MD as Consulting Physician (Cardiology)  Indicate any recent Medical Services you may have received from other than [facility] providers in the past year (date may be approximate).     Assessment:   This is a routine wellness examination for Bandon.  Hearing/Vision screen No exam data present  Dietary issues and exercise activities discussed: Current Exercise Habits: Home exercise routine, Type of exercise: Other - see comments (stationary bike), Time (Minutes): 20, Frequency (Times/Week): 7, Weekly Exercise (Minutes/Week): 140  Goals    . DIET - INCREASE WATER INTAKE     Recommend drinking at least 6-8 glasses of water a day     . Patient Stated     07/17/2020, stay healthy      Depression Screen PHQ 2/9 Scores 07/17/2020 07/14/2019 07/22/2018 07/08/2018 06/11/2017 06/24/2016  PHQ - 2 Score 0 0 0 0 0 0  PHQ- 9 Score - - 0 - - 0    Fall Risk Fall Risk  07/17/2020 07/14/2019 07/22/2018 07/08/2018 06/11/2017  Falls in the past year? 0 0 0 0 No  Number falls in past yr: - 0 0 0 -  Injury with Fall?  - 0 0 - -  Risk for fall due to : Medication side effect - - - -  Follow up Falls evaluation completed;Education provided;Falls prevention discussed - - - -    FALL RISK PREVENTION PERTAINING TO THE HOME:  Any stairs in or around the home? Yes  If so, are there any without handrails? No  Home free of loose throw rugs in walkways, pet beds, electrical cords, etc? Yes  Adequate lighting in your home to reduce risk of falls? Yes  ASSISTIVE DEVICES UTILIZED TO PREVENT FALLS:  Life alert? No  Use of a cane, walker or w/c? No  Grab bars in the bathroom? Yes  Shower chair or bench in shower? Yes  Elevated toilet seat or a handicapped toilet? Yes   TIMED UP AND GO:  Was the test performed? No .      Cognitive Function:     6CIT Screen 07/17/2020 07/08/2018 06/11/2017  What Year? 0 points 0 points 0 points  What month? 0 points 0 points 0 points  What time? 0 points 0 points 0 points  Count back from 20 0 points 0 points 0 points  Months in reverse 2 points 0 points 0 points  Repeat phrase 6 points 0 points 2 points  Total Score 8 0 2    Immunizations Immunization History  Administered Date(s) Administered  . Fluad Quad(high Dose 65+) 03/19/2019  . Influenza,inj,Quad PF,6+ Mos 05/07/2016, 02/19/2017, 04/17/2018  . Influenza-Unspecified 04/19/2015  . Pneumococcal Conjugate-13 10/29/2013  . Pneumococcal Polysaccharide-23 07/14/2008  . Td 07/14/2008, 01/20/2019  . Zoster 06/03/2010    TDAP status: Up to date  Flu Vaccine status: Declined, Education has been provided regarding the importance of this vaccine but patient still declined. Advised may receive this vaccine at local pharmacy or Health Dept. Aware to provide a copy of the vaccination record if obtained from local pharmacy or Health Dept. Verbalized acceptance and understanding.  Pneumococcal vaccine status: Up to date  Covid-19 vaccine status: Completed vaccines  Qualifies for Shingles Vaccine? Yes   Zostavax  completed Yes   Shingrix Completed?: No.    Education has been provided regarding the importance of this vaccine. Patient has been advised to call insurance company to determine out of pocket expense if they have not yet received this vaccine. Advised may also receive vaccine at local pharmacy or Health Dept. Verbalized acceptance and understanding.  Screening Tests Health Maintenance  Topic Date Due  . COVID-19 Vaccine (1) Never done  . INFLUENZA VACCINE  01/02/2020  . TETANUS/TDAP  01/19/2029  . PNA vac Low Risk Adult  Completed    Health Maintenance  Health Maintenance Due  Topic Date Due  . COVID-19 Vaccine (1) Never done  . INFLUENZA VACCINE  01/02/2020    Colorectal cancer screening: No longer required.   Lung Cancer Screening: (Low Dose CT Chest recommended if Age 46-80 years, 30 pack-year currently smoking OR have quit w/in 15years.) does not qualify.   Lung Cancer Screening Referral: no  Additional Screening:  Hepatitis C Screening: does not qualify;   Vision Screening: Recommended annual ophthalmology exams for early detection of glaucoma and other disorders of the eye. Is the patient up to date with their annual eye exam?  Yes  Who is the provider or what is the name of the office in which the patient attends annual eye exams? [facility] If pt is not established with a provider, would they like to be referred to a provider to establish care? No .   Dental Screening: Recommended annual dental exams for proper oral hygiene  Community Resource Referral / Chronic Care Management: CRR required this visit?  No   CCM required this visit?  No      Plan:     I have personally reviewed and noted the following in the patient's chart:   . Medical and social history . Use of alcohol, tobacco or illicit drugs  . Current medications and supplements . Functional ability and status . Nutritional status .  Physical activity . Advanced directives . List of  other physicians . Hospitalizations, surgeries, and ER visits in previous 12 months . Vitals . Screenings to include cognitive, depression, and falls . Referrals and appointments  In addition, I have reviewed and discussed with patient certain preventive protocols, quality metrics, and best practice recommendations. A written personalized care plan for preventive services as well as general preventive health recommendations were provided to patient.     Barb Merino, LPN   [id number]   Nurse Notes:

## 2020-07-17 NOTE — Patient Instructions (Signed)
[name] , Thank you for taking time to come for your Medicare Wellness Visit. I appreciate your ongoing commitment to your health goals. Please review the following plan we discussed and let me know if I can assist you in the future.   Screening recommendations/referrals: Colonoscopy: not required Recommended yearly ophthalmology/optometry visit for glaucoma screening and checkup Recommended yearly dental visit for hygiene and checkup  Vaccinations: Influenza vaccine: decline Pneumococcal vaccine: completed 10/29/2013 Tdap vaccine: completed 01/20/2019, due 01/19/2029 Shingles vaccine: discussed   Covid-19:  08/25/2019, 09/15/2019  Advanced directives: Please bring a copy of your POA (Power of Attorney) and/or Living Will to your next appointment.   Conditions/risks identified: none  Next appointment: Follow up in one year for your annual wellness visit.   Preventive Care 7 Years and Older, Male Preventive care refers to lifestyle choices and visits with your health care provider that can promote health and wellness. What does preventive care include?  A yearly physical exam. This is also called an annual well check.  Dental exams once or twice a year.  Routine eye exams. Ask your health care provider how often you should have your eyes checked.  Personal lifestyle choices, including:  Daily care of your teeth and gums.  Regular physical activity.  Eating a healthy diet.  Avoiding tobacco and drug use.  Limiting alcohol use.  Practicing safe sex.  Taking low doses of aspirin every day.  Taking vitamin and mineral supplements as recommended by your health care provider. What happens during an annual well check? The services and screenings done by your health care provider during your annual well check will depend on your age, overall health, lifestyle risk factors, and family history of disease. Counseling  Your health care provider may ask you questions about  your:  Alcohol use.  Tobacco use.  Drug use.  Emotional well-being.  Home and relationship well-being.  Sexual activity.  Eating habits.  History of falls.  Memory and ability to understand (cognition).  Work and work Astronomer. Screening  You may have the following tests or measurements:  Height, weight, and BMI.  Blood pressure.  Lipid and cholesterol levels. These may be checked every 5 years, or more frequently if you are over 38 years old.  Skin check.  Lung cancer screening. You may have this screening every year starting at age 11 if you have a 30-pack-year history of smoking and currently smoke or have quit within the past 15 years.  Fecal occult blood test (FOBT) of the stool. You may have this test every year starting at age 77.  Flexible sigmoidoscopy or colonoscopy. You may have a sigmoidoscopy every 5 years or a colonoscopy every 10 years starting at age 69.  Prostate cancer screening. Recommendations will vary depending on your family history and other risks.  Hepatitis C blood test.  Hepatitis B blood test.  Sexually transmitted disease (STD) testing.  Diabetes screening. This is done by checking your blood sugar (glucose) after you have not eaten for a while (fasting). You may have this done every 1-3 years.  Abdominal aortic aneurysm (AAA) screening. You may need this if you are a current or former smoker.  Osteoporosis. You may be screened starting at age 61 if you are at high risk. Talk with your health care provider about your test results, treatment options, and if necessary, the need for more tests. Vaccines  Your health care provider may recommend certain vaccines, such as:  Influenza vaccine. This is recommended every year.  Tetanus, diphtheria, and acellular pertussis (Tdap, Td) vaccine. You may need a Td booster every 10 years.  Zoster vaccine. You may need this after age 3.  Pneumococcal 13-valent conjugate (PCV13) vaccine.  One dose is recommended after age 82.  Pneumococcal polysaccharide (PPSV23) vaccine. One dose is recommended after age 60. Talk to your health care provider about which screenings and vaccines you need and how often you need them. This information is not intended to replace advice given to you by your health care provider. Make sure you discuss any questions you have with your health care provider. Document Released: 06/16/2015 Document Revised: 02/07/2016 Document Reviewed: 03/21/2015 Elsevier Interactive Patient Education  2017 [place] Prevention in the Home Falls can cause injuries. They can happen to people of all ages. There are many things you can do to make your home safe and to help prevent falls. What can I do on the outside of my home?  Regularly fix the edges of walkways and driveways and fix any cracks.  Remove anything that might make you trip as you walk through a door, such as a raised step or threshold.  Trim any bushes or trees on the path to your home.  Use bright outdoor lighting.  Clear any walking paths of anything that might make someone trip, such as rocks or tools.  Regularly check to see if handrails are loose or broken. Make sure that both sides of any steps have handrails.  Any raised decks and porches should have guardrails on the edges.  Have any leaves, snow, or ice cleared regularly.  Use sand or salt on walking paths during winter.  Clean up any spills in your garage right away. This includes oil or grease spills. What can I do in the bathroom?  Use night lights.  Install grab bars by the toilet and in the tub and shower. Do not use towel bars as grab bars.  Use non-skid mats or decals in the tub or shower.  If you need to sit down in the shower, use a plastic, non-slip stool.  Keep the floor dry. Clean up any water that spills on the floor as soon as it happens.  Remove soap buildup in the tub or shower regularly.  Attach bath  mats securely with double-sided non-slip rug tape.  Do not have throw rugs and other things on the floor that can make you trip. What can I do in the bedroom?  Use night lights.  Make sure that you have a light by your bed that is easy to reach.  Do not use any sheets or blankets that are too big for your bed. They should not hang down onto the floor.  Have a firm chair that has side arms. You can use this for support while you get dressed.  Do not have throw rugs and other things on the floor that can make you trip. What can I do in the kitchen?  Clean up any spills right away.  Avoid walking on wet floors.  Keep items that you use a lot in easy-to-reach places.  If you need to reach something above you, use a strong step stool that has a grab bar.  Keep electrical cords out of the way.  Do not use floor polish or wax that makes floors slippery. If you must use wax, use non-skid floor wax.  Do not have throw rugs and other things on the floor that can make you trip. What can I do  with my stairs?  Do not leave any items on the stairs.  Make sure that there are handrails on both sides of the stairs and use them. Fix handrails that are broken or loose. Make sure that handrails are as long as the stairways.  Check any carpeting to make sure that it is firmly attached to the stairs. Fix any carpet that is loose or worn.  Avoid having throw rugs at the top or bottom of the stairs. If you do have throw rugs, attach them to the floor with carpet tape.  Make sure that you have a light switch at the top of the stairs and the bottom of the stairs. If you do not have them, ask someone to add them for you. What else can I do to help prevent falls?  Wear shoes that:  Do not have high heels.  Have rubber bottoms.  Are comfortable and fit you well.  Are closed at the toe. Do not wear sandals.  If you use a stepladder:  Make sure that it is fully opened. Do not climb a closed  stepladder.  Make sure that both sides of the stepladder are locked into place.  Ask someone to hold it for you, if possible.  Clearly mark and make sure that you can see:  Any grab bars or handrails.  First and last steps.  Where the edge of each step is.  Use tools that help you move around (mobility aids) if they are needed. These include:  Canes.  Walkers.  Scooters.  Crutches.  Turn on the lights when you go into a dark area. Replace any light bulbs as soon as they burn out.  Set up your furniture so you have a clear path. Avoid moving your furniture around.  If any of your floors are uneven, fix them.  If there are any pets around you, be aware of where they are.  Review your medicines with your doctor. Some medicines can make you feel dizzy. This can increase your chance of falling. Ask your doctor what other things that you can do to help prevent falls. This information is not intended to replace advice given to you by your health care provider. Make sure you discuss any questions you have with your health care provider. Document Released: 03/16/2009 Document Revised: 10/26/2015 Document Reviewed: 06/24/2014 Elsevier Interactive Patient Education  2017 [employer].

## 2020-09-18 ENCOUNTER — Ambulatory Visit (INDEPENDENT_AMBULATORY_CARE_PROVIDER_SITE_OTHER): Payer: Medicare Other

## 2020-09-18 DIAGNOSIS — I428 Other cardiomyopathies: Secondary | ICD-10-CM

## 2020-09-20 LAB — CUP PACEART REMOTE DEVICE CHECK
Battery Remaining Longevity: 34 mo
Battery Voltage: 2.96 V
Brady Statistic AP VP Percent: 88.48 %
Brady Statistic AP VS Percent: 0.01 %
Brady Statistic AS VP Percent: 11.48 %
Brady Statistic AS VS Percent: 0.02 %
Brady Statistic RA Percent Paced: 88.09 %
Brady Statistic RV Percent Paced: 99.74 %
Date Time Interrogation Session: 20220418223737
HighPow Impedance: 73 Ohm
Implantable Lead Implant Date: 20171025
Implantable Lead Implant Date: 20171025
Implantable Lead Location: 753859
Implantable Lead Location: 753860
Implantable Lead Model: 5076
Implantable Pulse Generator Implant Date: 20171025
Lead Channel Impedance Value: 361 Ohm
Lead Channel Impedance Value: 418 Ohm
Lead Channel Impedance Value: 475 Ohm
Lead Channel Pacing Threshold Amplitude: 0.75 V
Lead Channel Pacing Threshold Amplitude: 0.75 V
Lead Channel Pacing Threshold Pulse Width: 0.4 ms
Lead Channel Pacing Threshold Pulse Width: 0.4 ms
Lead Channel Sensing Intrinsic Amplitude: 1.75 mV
Lead Channel Sensing Intrinsic Amplitude: 1.75 mV
Lead Channel Sensing Intrinsic Amplitude: 5.5 mV
Lead Channel Sensing Intrinsic Amplitude: 5.5 mV
Lead Channel Setting Pacing Amplitude: 2 V
Lead Channel Setting Pacing Amplitude: 2.5 V
Lead Channel Setting Pacing Pulse Width: 0.4 ms
Lead Channel Setting Sensing Sensitivity: 0.3 mV

## 2020-10-05 NOTE — Progress Notes (Signed)
Remote ICD transmission.   

## 2020-10-11 ENCOUNTER — Encounter: Payer: Self-pay | Admitting: Internal Medicine

## 2020-10-11 ENCOUNTER — Other Ambulatory Visit: Payer: Self-pay

## 2020-10-11 ENCOUNTER — Ambulatory Visit (INDEPENDENT_AMBULATORY_CARE_PROVIDER_SITE_OTHER): Payer: Medicare Other | Admitting: Internal Medicine

## 2020-10-11 VITALS — BP 124/70 | HR 67 | Ht 71.0 in | Wt 181.0 lb

## 2020-10-11 DIAGNOSIS — I472 Ventricular tachycardia, unspecified: Secondary | ICD-10-CM

## 2020-10-11 DIAGNOSIS — I2510 Atherosclerotic heart disease of native coronary artery without angina pectoris: Secondary | ICD-10-CM

## 2020-10-11 DIAGNOSIS — E782 Mixed hyperlipidemia: Secondary | ICD-10-CM

## 2020-10-11 DIAGNOSIS — I5022 Chronic systolic (congestive) heart failure: Secondary | ICD-10-CM

## 2020-10-11 DIAGNOSIS — I428 Other cardiomyopathies: Secondary | ICD-10-CM | POA: Diagnosis not present

## 2020-10-11 DIAGNOSIS — I340 Nonrheumatic mitral (valve) insufficiency: Secondary | ICD-10-CM

## 2020-10-11 DIAGNOSIS — I10 Essential (primary) hypertension: Secondary | ICD-10-CM | POA: Diagnosis not present

## 2020-10-11 NOTE — Progress Notes (Signed)
Follow-up Outpatient Visit Date: 10/11/2020  Primary Care Provider: Marjie Skiff, NP [address] [place] [address]  Chief Complaint: Follow-up valvular heart disease, nonischemic cardiomyopathy, and ventricular tachycardia  HPI:  Roy Palmer is a 87 y.o. 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  I last saw him in 04/2020, at which time he was doing well from a heart standpoint.  He had been hospitalized in 12/2019 with concern for stroke in the setting of facial droop, though he was ultimately diagnosed with Bell's palsy.  Echo at that time showed improved LVEF to 45-50%.  He saw Dr. Ladona Ridgel in February, at which time no medication changes or additional interventions were pursued.  Today, Roy Palmer reports that he has been feeling relatively well.  He denies chest pain, shortness of breath, palpitations, lightheadedness, and edema.  His right-sided facial weakness secondary to Bell's palsy has not completely resolved and he is planning to undergo further eyelid surgery in July.  He is tolerating his medications well.  --------------------------------------------------------------------------------------------------  Cardiovascular History & Procedures: Cardiovascular Problems:  Severe mitral regurgitation status post repair (03/2016)  Chronic systolic heart failure secondary to nonischemic cardiomyopathy  Frequent ventricular ectopy with sustained ventricular tachycardia status post ICD  Nonobstructive coronary artery disease  Risk Factors:  Age, male gender, hypertension, and hyperlipidemia  Cath/PCI:  LHC/RHC (02/23/16): Mild, nonobstructive CAD (30% ostial  LMCA, 30% ostial LAD, 30% ostial ramus, and minimal luminal irregularities of dominant LCx. Normal right heart filling pressures. Decreased Fick cardiac output (CO 3.6 L/m, CI 1.8 L/m/m) in setting of frequent PVCs and severe MR.  CV Surgery:  Minimally invasive mitral valve repair (03/19/16, Dr. Cornelius Moras)  EP Procedures and Devices:  24-hour Holter monitor (02/27/16): Predominantly sinus rhythm with average heart rate is 63 bpm (range 49-91 bpm). Longest RR interval 1.6 seconds. Frequent PVCs noted (11% burden) including frequent couplets and bigeminal cycles. 90 runs of NSVT were noted lasting up to 9 beats the maximum rate of 134 bpm. Rare SVT and supraventricular ectopy noted.  Dual chamber ICD (03/27/16, [employer])  Non-Invasive Evaluation(s):  TTE (12/21/2019): Normal LV size with moderate LVH.  LVEF 45-50% with global hypokinesis.  Mildly dilated RV with mild RV wall thickening and normal contraction.  Normal PA pressure.  Normal biatrial size.  Mild mitral regurgitation.  Aortic sclerosis without stenosis.  Left arm venous Duplex (07/06/19): No evidence of DVT.  TTE (04/25/17): Mildly dilated LV with moderate LVH. LVEF 25-30% with diffuse hypokinesis. Aortic sclerosis. Moderately elevated transmitral gradient status post repair. Moderate mitral regurgitation. Mild to moderate left atrial enlargement. Mildly reduced RV contraction.  Limited TTE (02/11/17): LVEF 25-30% with probable akinesis of the inferolateral and inferior myocardium. Grade 2 diastolic dysfunction. Mild LA enlargement.  TTE (09/03/16): Moderately dilated left ventricle with LVEF less than 20% and global hypokinesis. Prior surgical repair of mitral valve is evident with moderate regurgitation. Left atrium is moderately dilated. RV size and function are normal. Moderate pulmonary hypertension noted with RVSP of 54 mmHg.  Limited TTE (03/22/16): Normal LV size with moderate LVH and LVEF of 40-45% with possible inferior  hypokinesis and incoordinate septal motion. Mitral angioplasty ring in place with trivial MR. No significant mitral stenosis. Mild left atrial enlargement. Normal RV was moderately reduced systolic function. RVSP 41 mm or mercury.  Elevated central venous pressure.  TTE (02/14/16): Normal LV size with moderate LVH. EF mildly reduced at 50% with possible inferolateral hypokinesis. Moderate to severe MR noted with prolapse/flail of the posterior leaflet. Severe left atrial enlargement. Moderate TR.  TEE (02/23/16): Normal LV size and wall thickness with lower normal LVEF. Trivial AI. Severe, eccentric MR with reversal of pulmonary vein flow and flail posterior leaflet. No left atrial thrombus. Lipomatous hypertrophy of the septum. Mild to moderate TR.  Limited TTE (03/22/16): Normal LV size with moderate LVH. LVEF 40-45% with possible inferior hypokinesis. Trivial MR with mild left atrial enlargement. Normal RV size with moderately reduced systolic function. Mild TR. No pericardial effusion.   Recent CV Pertinent Labs: Lab Results  Component Value Date   CHOL 202 (H) 10/11/2020   CHOL 188 07/29/2019   HDL 42 10/11/2020   LDLCALC 122 (H) 10/11/2020   LDLDIRECT 123 (H) 10/11/2020   TRIG 214 (H) 10/11/2020   TRIG 320 (H) 07/29/2019   CHOLHDL 4.8 10/11/2020   CHOLHDL 5.5 12/22/2019   INR 1.0 12/21/2019   BNP 149.0 (H) 12/21/2019   K 4.9 10/11/2020   MG 2.5 (H) 10/24/2019   BUN 26 10/11/2020   CREATININE 1.63 (H) 10/11/2020    Past medical and surgical history were reviewed and updated in EPIC.  Current Meds  Medication Sig  . acetaminophen (TYLENOL) 325 MG tablet Take 325-650 mg by mouth every 6 (six) hours as needed for mild pain, moderate pain or fever.   Marland Kitchen albuterol (VENTOLIN HFA) 108 (90 Base) MCG/ACT inhaler Inhale 2 puffs into the lungs every 6 (six) hours as needed for wheezing or shortness of breath.  Marland Kitchen amiodarone (PACERONE) 200 MG tablet TAKE 1 TABLET BY MOUTH EVERY DAY MONDAY  THROUGH SATURDAY. DO NOT TAKE ON SUNDAY  . amLODipine (NORVASC) 2.5 MG tablet TAKE 1 TABLET(2.5 MG) BY MOUTH DAILY  . aspirin EC 81 MG tablet Take 1 tablet (81 mg total) by mouth daily.  . Multiple Vitamins-Minerals (MULTIVITAMIN ADULTS 50+ PO) Take 1 tablet by mouth daily.    Allergies: Amiodarone and Lyrica [pregabalin]  Social History   Tobacco Use  . Smoking status: Never Smoker  . Smokeless tobacco: Never Used  Vaping Use  . Vaping Use: Never used  Substance Use Topics  . Alcohol use: No  . Drug use: No    Family History  Problem Relation Age of Onset  . Hypertension Mother   . Stroke Mother   . Heart disease Father   . Heart attack Father   . Dementia Sister   . Retinoblastoma Son   . COPD Neg Hx   . Cancer Neg Hx   . Diabetes Neg Hx     Review of Systems: A 12-system review of systems was performed and was negative except as noted in the HPI.  --------------------------------------------------------------------------------------------------  Physical Exam: BP 124/70 (BP Location: Left Arm, Patient Position: Sitting, Cuff Size: Large)   Pulse 67   Ht 5\' 11"  (1.803 m)   Wt 181 lb (82.1 kg)   SpO2 97%   BMI 25.24 kg/m   General:  NAD. Neck: No JVD or HJR. Lungs: Clear to auscultation bilaterally without wheezes or crackles. Heart: Regular rate and rhythm without murmurs, rubs, or gallops. Abdomen: Soft, nontender, nondistended. Extremities: No lower extremity edema.  EKG: AV paced rhythm.  Lab Results  Component Value Date   WBC 7.6 01/06/2020   HGB 14.7 01/06/2020   HCT 44.8 01/06/2020   MCV 97 01/06/2020  PLT 199 01/06/2020    Lab Results  Component Value Date   NA 137 10/11/2020   K 4.9 10/11/2020   CL 103 10/11/2020   CO2 23 10/11/2020   BUN 26 10/11/2020   CREATININE 1.63 (H) 10/11/2020   GLUCOSE 131 (H) 10/11/2020   ALT 22 10/11/2020    Lab Results  Component Value Date   CHOL 202 (H) 10/11/2020   HDL 42 10/11/2020   LDLCALC  122 (H) 10/11/2020   LDLDIRECT 123 (H) 10/11/2020   TRIG 214 (H) 10/11/2020   CHOLHDL 4.8 10/11/2020    --------------------------------------------------------------------------------------------------  ASSESSMENT AND PLAN: Nonischemic cardiomyopathy with chronic HFrEF: Roy Palmer appears euvolemic with stable NYHA class II symptoms.  Most recent echo last year showed significant improvement in his LV systolic function despite not being on goal-directed medical therapy due to multiple intolerances.  We will defer medication changes today.  Mitral regurgitation: No symptoms to suggest worsening heart failure.  Mitral regurgitation noted to be mild with normal mitral valve gradient in the setting of prior repair on echo in 12/2019.  Continue clinical follow-up.  Nonobstructive coronary artery disease: No symptoms to suggest worsening coronary insufficiency are reported.  Catheterization in 2017 leading up to mitral valve repair showed mild, nonobstructive CAD.  We will continue aspirin and consider adding a statin if LDL remains significantly elevated.  Hypertension: Blood pressure well controlled today.  Continue low-dose amlodipine.  Hyperlipidemia: LDL moderately elevated on last check.  Roy Palmer has been reluctant to add a statin in the past but would consider it now if his lipids are still suboptimal.  Given mild CAD noted by catheterization in 2017, I think it would be beneficial to target an LDL less than 100.  We will recheck a lipid panel today and plan to add low-dose atorvastatin if LDL remains above 100.  Ventricular tachycardia: No palpitations reported.  Continue ongoing follow-up with Dr. Ladona Ridgel and the device clinic.  Follow-up: Return to clinic in 6 months.  [name], MD 10/13/2020 7:27 AM

## 2020-10-11 NOTE — Patient Instructions (Signed)
Medication Instructions:  - Your physician recommends that you continue on your current medications as directed. Please refer to the Current Medication list given to you today.  *If you need a refill on your cardiac medications before your next appointment, please call your pharmacy*   Lab Work: - Your physician recommends that you have lab work today: CMET/ Lipid/ Direct LDL  If you have labs (blood work) drawn today and your tests are completely normal, you will receive your results only by: Marland Kitchen MyChart Message (if you have MyChart) OR . A paper copy in the mail If you have any lab test that is abnormal or we need to change your treatment, we will call you to review the results.   Testing/Procedures: - none ordered   Follow-Up: At [facility], you and your health needs are our priority.  As part of our continuing mission to provide you with exceptional heart care, we have created designated Provider Care Teams.  These Care Teams include your primary Cardiologist (physician) and Advanced Practice Providers (APPs -  Physician Assistants and Nurse Practitioners) who all work together to provide you with the care you need, when you need it.  We recommend signing up for the patient portal called "MyChart".  Sign up information is provided on this After Visit Summary.  MyChart is used to connect with patients for Virtual Visits (Telemedicine).  Patients are able to view lab/test results, encounter notes, upcoming appointments, etc.  Non-urgent messages can be sent to your provider as well.   To learn more about what you can do with MyChart, go to [email or web address].    Your next appointment:   6 month(s)  The format for your next appointment:   In Person  Provider:   You may see [name], MD or one of the following Advanced Practice Providers on your designated Care Team:    Nicolasa Ducking, NP  Eula Listen, PA-C  Marisue Ivan, PA-C  Cadence [place], [place]  Gillian Shields, NP    Other Instructions n/a

## 2020-10-12 LAB — LIPID PANEL
Chol/HDL Ratio: 4.8 ratio (ref 0.0–5.0)
Cholesterol, Total: 202 mg/dL — ABNORMAL HIGH (ref 100–199)
HDL: 42 mg/dL (ref >39)
LDL Chol Calc (NIH): 122 mg/dL — ABNORMAL HIGH (ref 0–99)
Triglycerides: 214 mg/dL — ABNORMAL HIGH (ref 0–149)
VLDL Cholesterol Cal: 38 mg/dL (ref 5–40)

## 2020-10-12 LAB — COMPREHENSIVE METABOLIC PANEL
ALT: 22 IU/L (ref 0–44)
AST: 18 IU/L (ref 0–40)
Albumin/Globulin Ratio: 2 (ref 1.2–2.2)
Albumin: 4.3 g/dL (ref 3.6–4.6)
Alkaline Phosphatase: 65 IU/L (ref 44–121)
BUN/Creatinine Ratio: 16 (ref 10–24)
BUN: 26 mg/dL (ref 8–27)
Bilirubin Total: 0.3 mg/dL (ref 0.0–1.2)
CO2: 23 mmol/L (ref 20–29)
Calcium: 8.8 mg/dL (ref 8.6–10.2)
Chloride: 103 mmol/L (ref 96–106)
Creatinine, Ser: 1.63 mg/dL — ABNORMAL HIGH (ref 0.76–1.27)
Globulin, Total: 2.1 g/dL (ref 1.5–4.5)
Glucose: 131 mg/dL — ABNORMAL HIGH (ref 65–99)
Potassium: 4.9 mmol/L (ref 3.5–5.2)
Sodium: 137 mmol/L (ref 134–144)
Total Protein: 6.4 g/dL (ref 6.0–8.5)
eGFR: 40 mL/min/{1.73_m2} — ABNORMAL LOW (ref >59)

## 2020-10-12 LAB — LDL CHOLESTEROL, DIRECT: LDL Direct: 123 mg/dL — ABNORMAL HIGH (ref 0–99)

## 2020-10-13 ENCOUNTER — Telehealth: Payer: Self-pay | Admitting: Internal Medicine

## 2020-10-13 ENCOUNTER — Encounter: Payer: Self-pay | Admitting: Internal Medicine

## 2020-10-13 DIAGNOSIS — E782 Mixed hyperlipidemia: Secondary | ICD-10-CM

## 2020-10-13 NOTE — Telephone Encounter (Signed)
[name], MD  10/12/2020 3:21 PM EDT      Please let Mr. Aument know that his kidney function, liver function, and electrolytes are stable. His cholesterol is elevated with an LDL of 123. If he is agreeable, I recommend that we start atorvastatin 10 mg daily and check a fasting lipid panel and ALT in ~3 months.

## 2020-10-13 NOTE — Telephone Encounter (Signed)
Attempted to call the patient. No answer- I left a message to please call back.  

## 2020-10-16 MED ORDER — ATORVASTATIN CALCIUM 10 MG PO TABS: 10.0000 mg | ORAL_TABLET | Freq: Every day | ORAL | 5 refills | Status: DC

## 2020-10-16 NOTE — Telephone Encounter (Signed)
Patient returning call for results 

## 2020-10-16 NOTE — Telephone Encounter (Signed)
Patient made aware of lab results and Dr. Serita Kyle recommendation. Patient is agreeable with starting atorvastatin 10 mg daily. An Rx has been sent to the pt pharmacy. Orders for 3 mo fasting lipid and alt to be [facility] have been placed and the patient is aware.  Patient verbalized understanding and has no questions or concerns at this time.

## 2020-11-03 ENCOUNTER — Other Ambulatory Visit: Payer: Self-pay | Admitting: Physician Assistant

## 2020-12-18 ENCOUNTER — Ambulatory Visit (INDEPENDENT_AMBULATORY_CARE_PROVIDER_SITE_OTHER): Payer: Medicare Other

## 2020-12-18 DIAGNOSIS — I428 Other cardiomyopathies: Secondary | ICD-10-CM

## 2020-12-19 LAB — CUP PACEART REMOTE DEVICE CHECK
Battery Remaining Longevity: 29 mo
Battery Voltage: 2.95 V
Brady Statistic AP VP Percent: 89.2 %
Brady Statistic AP VS Percent: 0.01 %
Brady Statistic AS VP Percent: 10.78 %
Brady Statistic AS VS Percent: 0.01 %
Brady Statistic RA Percent Paced: 88.91 %
Brady Statistic RV Percent Paced: 99.88 %
Date Time Interrogation Session: 20220719091939
HighPow Impedance: 75 Ohm
Implantable Lead Implant Date: 20171025
Implantable Lead Implant Date: 20171025
Implantable Lead Location: 753859
Implantable Lead Location: 753860
Implantable Lead Model: 5076
Implantable Pulse Generator Implant Date: 20171025
Lead Channel Impedance Value: 361 Ohm
Lead Channel Impedance Value: 399 Ohm
Lead Channel Impedance Value: 456 Ohm
Lead Channel Pacing Threshold Amplitude: 0.625 V
Lead Channel Pacing Threshold Amplitude: 0.625 V
Lead Channel Pacing Threshold Pulse Width: 0.4 ms
Lead Channel Pacing Threshold Pulse Width: 0.4 ms
Lead Channel Sensing Intrinsic Amplitude: 2 mV
Lead Channel Sensing Intrinsic Amplitude: 2 mV
Lead Channel Sensing Intrinsic Amplitude: 5.5 mV
Lead Channel Sensing Intrinsic Amplitude: 5.5 mV
Lead Channel Setting Pacing Amplitude: 2 V
Lead Channel Setting Pacing Amplitude: 2.5 V
Lead Channel Setting Pacing Pulse Width: 0.4 ms
Lead Channel Setting Sensing Sensitivity: 0.3 mV

## 2021-01-10 NOTE — Progress Notes (Signed)
Remote ICD transmission.   

## 2021-02-02 ENCOUNTER — Other Ambulatory Visit: Payer: Self-pay | Admitting: Internal Medicine

## 2021-02-02 DIAGNOSIS — I5022 Chronic systolic (congestive) heart failure: Secondary | ICD-10-CM

## 2021-02-02 DIAGNOSIS — E782 Mixed hyperlipidemia: Secondary | ICD-10-CM

## 2021-02-02 DIAGNOSIS — Z95810 Presence of automatic (implantable) cardiac defibrillator: Secondary | ICD-10-CM

## 2021-02-02 DIAGNOSIS — I472 Ventricular tachycardia, unspecified: Secondary | ICD-10-CM

## 2021-02-02 DIAGNOSIS — I428 Other cardiomyopathies: Secondary | ICD-10-CM

## 2021-02-06 ENCOUNTER — Other Ambulatory Visit: Payer: Self-pay | Admitting: *Deleted

## 2021-02-06 DIAGNOSIS — E782 Mixed hyperlipidemia: Secondary | ICD-10-CM

## 2021-02-06 MED ORDER — ATORVASTATIN CALCIUM 10 MG PO TABS: 10.0000 mg | ORAL_TABLET | Freq: Every day | ORAL | 0 refills | Status: DC

## 2021-02-13 ENCOUNTER — Ambulatory Visit (INDEPENDENT_AMBULATORY_CARE_PROVIDER_SITE_OTHER): Payer: Medicare Other | Admitting: Nurse Practitioner

## 2021-02-13 ENCOUNTER — Encounter: Payer: Self-pay | Admitting: Nurse Practitioner

## 2021-02-13 ENCOUNTER — Ambulatory Visit: Payer: Self-pay | Admitting: *Deleted

## 2021-02-13 DIAGNOSIS — U071 COVID-19: Secondary | ICD-10-CM | POA: Insufficient documentation

## 2021-02-13 MED ORDER — PREDNISONE 20 MG PO TABS: 20.0000 mg | ORAL_TABLET | Freq: Every day | ORAL | 0 refills | Status: AC

## 2021-02-13 NOTE — Patient Instructions (Signed)

## 2021-02-13 NOTE — Telephone Encounter (Signed)
Pt is calling and has been dx with covid for last 8 day. Pt took in home covid test again today and still positive.  Pt has no energy,dry mouth and takes multi vitamin. There is no virtual appt available today and pt would like advise  Reason for Disposition  [1] HIGH RISK for severe COVID complications (e.g., weak immune system, age > 64 years, obesity with BMI > 25, pregnant, chronic lung disease or other chronic medical condition) AND [2] COVID symptoms (e.g., cough, fever)  (Exceptions: Already seen by PCP and no new or worsening symptoms.)  Answer Assessment - Initial Assessment Questions 1. COVID-19 DIAGNOSIS: "Who made your COVID-19 diagnosis?" "Was it confirmed by a positive lab test or self-test?" If not diagnosed by a doctor (or NP/PA), ask "Are there lots of cases (community spread) where you live?" Note: See public health department website, if unsure.     8 days ago home test 2. COVID-19 EXPOSURE: "Was there any known exposure to COVID before the symptoms began?" CDC Definition of close contact: within 6 feet (2 meters) for a total of 15 minutes or more over a 24-hour period.      Wife got COVID 3. ONSET: "When did the COVID-19 symptoms start?"      8 days ago 4. WORST SYMPTOM: "What is your worst symptom?" (e.g., cough, fever, shortness of breath, muscle aches)     Fatigue- feeling, dry mouth 5. COUGH: "Do you have a cough?" If Yes, ask: "How bad is the cough?"       no 6. FEVER: "Do you have a fever?" If Yes, ask: "What is your temperature, how was it measured, and when did it start?"     No- sweats 7. RESPIRATORY STATUS: "Describe your breathing?" (e.g., shortness of breath, wheezing, unable to speak)      Breathing normal 8. BETTER-SAME-WORSE: "Are you getting better, staying the same or getting worse compared to yesterday?"  If getting worse, ask, "In what way?"     Worse- lack energy- can't eat 9. HIGH RISK DISEASE: "Do you have any chronic medical problems?" (e.g., asthma,  heart or lung disease, weak immune system, obesity, etc.)     Age, heart disease 10. VACCINE: "Have you had the COVID-19 vaccine?" If Yes, ask: "Which one, how many shots, when did you get it?"       Yes- pfizer  11. BOOSTER: "Have you received your COVID-19 booster?" If Yes, ask: "Which one and when did you get it?"       no 12. PREGNANCY: "Is there any chance you are pregnant?" "When was your last menstrual period?"       N/a 13. OTHER SYMPTOMS: "Do you have any other symptoms?"  (e.g., chills, fatigue, headache, loss of smell or taste, muscle pain, sore throat)       Loss taste 14. O2 SATURATION MONITOR:  "Do you use an oxygen saturation monitor (pulse oximeter) at home?" If Yes, ask "What is your reading (oxygen level) today?" "What is your usual oxygen saturation reading?" (e.g., 95%)       no  Protocols used: Coronavirus (COVID-19) Diagnosed or Suspected-A-AH

## 2021-02-13 NOTE — Telephone Encounter (Signed)
Appointment today at 3 pm.

## 2021-02-13 NOTE — Progress Notes (Signed)
There were no vitals taken for this visit.   Subjective:    Patient ID: Roy Palmer, male    DOB: 1930-04-12, 87 y.o.   MRN: [id number]  HPI: Roy Palmer is a 87 y.o. male  Chief Complaint  Patient presents with   Covid Positive    Patient states he tested positive for Covid 8 days and is here for a follow up post Covid. Patient states he still feels he is sluggish and tired, patient states he notices he will be hot than cold and states his taste is off and everything tastes funny. Patient denies having any cough. Patient states he was taking Tylenol Cold and Flu. Patient states it helped him. Patient states he thinks he may something to help with lingering effects.    This visit was completed via telephone due to the restrictions of the COVID-19 pandemic. All issues as above were discussed and addressed but no physical exam was performed. If it was felt that the patient should be evaluated in the office, they were directed there. The patient verbally consented to this visit. Patient was unable to complete an audio/visual visit due to Lack of equipment. Due to the catastrophic nature of the COVID-19 pandemic, this visit was done through audio contact only. Location of the patient: home Location of the provider: work Those involved with this call:  Provider: Aura Dials, DNP CMA: Malen Gauze, CMA Front Desk/Registration: Kandice Hams  Time spent on call:  21 minutes on the phone discussing health concerns. 15 minutes total spent in review of patient's record and preparation of their chart.   COVID POSITIVE Tested positive a little over 8 days ago and tested positive again today.  He has ongoing symptoms: tired, no pep, occasional chills, and taste is gone/mouth stays very dry and has an odd taste.  Denies any cough, fever, SOB, or CP.  Overall remainder of symptoms, except ones mentioned above, have improved.  [place] and would like a "quick fix" for his  ongoing fatigue and loss of taste.  Discussed with him at length that is is outside window for any of the Covid treatments. Fever: no Cough: no Shortness of breath: no Wheezing: no Chest pain: no Chest tightness: no Chest congestion: no Nasal congestion: no Runny nose: no Post nasal drip: no Sneezing: no Sore throat: no Swollen glands: no Sinus pressure: no Headache: no Face pain: no Toothache: no Ear pain: none Ear pressure: none Eyes red/itching:no Eye drainage/crusting: no  Vomiting: no Rash: no Fatigue: yes Sick contacts: no Strep contacts: no  Context: getting better, but not 100% Recurrent sinusitis: no Relief with OTC cold/cough medications: no  Treatments attempted: cold/sinus   Relevant past medical, surgical, family and social history reviewed and updated as indicated. Interim medical history since our last visit reviewed. Allergies and medications reviewed and updated.  Review of Systems  Constitutional:  Positive for chills and fatigue. Negative for activity change, appetite change and fever.  HENT: Negative.    Respiratory: Negative.    Cardiovascular: Negative.   Gastrointestinal: Negative.   Neurological: Negative.    Per HPI unless specifically indicated above     Objective:    There were no vitals taken for this visit.  Wt Readings from Last 3 Encounters:  10/11/20 181 lb (82.1 kg)  07/17/20 180 lb (81.6 kg)  07/06/20 179 lb 9.6 oz (81.5 kg)    Physical Exam  Unable to perform due to telephone visit  only.  Results for orders placed or performed in visit on 12/18/20  CUP PACEART REMOTE DEVICE CHECK  Result Value Ref Range   Date Time Interrogation Session [id number]    Pulse Generator Manufacturer MERM    Pulse Gen Model [id number] Evera MRI XT DR    Pulse Gen Serial Number [id number]    Clinic Name [facility]    Implantable Pulse Generator Type Implantable Cardiac Defibulator    Implantable Pulse Generator Implant Date [id number]     Implantable Lead Manufacturer MERM    Implantable Lead Model 5076 CapSureFix Novus MRI SureScan    Implantable Lead Serial Number [id number]    Implantable Lead Implant Date [id number]    Implantable Lead Location Detail 1 APPENDAGE    Implantable Lead Location [id number]    Implantable Lead Manufacturer [facility]    Implantable Lead Model [id number] Sprint Quattro Secure S MRI SureScan    Implantable Lead Serial Number [id number]    Implantable Lead Implant Date [id number]    Implantable Lead Location Detail 1 APEX    Implantable Lead Location [id number]    Lead Channel Setting Sensing Sensitivity 0.3 mV   Lead Channel Setting Pacing Amplitude 2 V   Lead Channel Setting Pacing Pulse Width 0.4 ms   Lead Channel Setting Pacing Amplitude 2.5 V   Lead Channel Impedance Value 361 ohm   Lead Channel Sensing Intrinsic Amplitude 2 mV   Lead Channel Sensing Intrinsic Amplitude 2 mV   Lead Channel Pacing Threshold Amplitude 0.625 V   Lead Channel Pacing Threshold Pulse Width 0.4 ms   Lead Channel Impedance Value 456 ohm   Lead Channel Impedance Value 399 ohm   Lead Channel Sensing Intrinsic Amplitude 5.5 mV   Lead Channel Sensing Intrinsic Amplitude 5.5 mV   Lead Channel Pacing Threshold Amplitude 0.625 V   Lead Channel Pacing Threshold Pulse Width 0.4 ms   HighPow Impedance 75 ohm   Battery Status OK    Battery Remaining Longevity 29 mo   Battery Voltage 2.95 V   Brady Statistic RA Percent Paced 88.91 %   Brady Statistic RV Percent Paced 99.88 %   Brady Statistic AP VP Percent 89.2 %   Brady Statistic AS VP Percent 10.78 %   Brady Statistic AP VS Percent 0.01 %   Brady Statistic AS VS Percent 0.01 %      Assessment & Plan:   Problem List Items Addressed This Visit       Other   COVID-19 virus RNA test result positive at limit of detection    Acute with initial Covid positive test > 8 days ago and symptoms starting prior to this.  Discussed with patient he is past window for current oral and IV  Covid treatments.  Remaining symptoms include only fatigue, occasional chills, and loss of taste.  Educated him on Covid, that there is no "quick fix" for the lingering symptoms.  He has no complaint of cough or SOB, low suspicion for pneumonia.  Suspect more lingering symptoms and discussed with him these can last several weeks to months.  At this time will trial a low dose of Prednisone 20 MG daily for 5 days, no higher doses due to HF and recommend he start taking daily Vitamin C and Zinc. Ensure he gets plenty of fluids and rest.  Will plan on follow-up with him in 4 weeks, he is aware to return sooner if any SOB, CP, or cough present.       I discussed the assessment  and treatment plan with the patient. The patient was provided an opportunity to ask questions and all were answered. The patient agreed with the plan and demonstrated an understanding of the instructions.   The patient was advised to call back or seek an in-person evaluation if the symptoms worsen or if the condition fails to improve as anticipated.   I provided 21+ minutes of time during this encounter.   Follow up plan: Return in about 4 weeks (around 03/13/2021) for Covid.

## 2021-02-13 NOTE — Assessment & Plan Note (Signed)
Acute with initial Covid positive test > 8 days ago and symptoms starting prior to this.  Discussed with patient he is past window for current oral and IV Covid treatments.  Remaining symptoms include only fatigue, occasional chills, and loss of taste.  Educated him on Covid, that there is no "quick fix" for the lingering symptoms.  He has no complaint of cough or SOB, low suspicion for pneumonia.  Suspect more lingering symptoms and discussed with him these can last several weeks to months.  At this time will trial a low dose of Prednisone 20 MG daily for 5 days, no higher doses due to HF and recommend he start taking daily Vitamin C and Zinc. Ensure he gets plenty of fluids and rest.  Will plan on follow-up with him in 4 weeks, he is aware to return sooner if any SOB, CP, or cough present.

## 2021-02-13 NOTE — Telephone Encounter (Signed)
Patient is calling to report he has + COVID test 8 days ago. Patient states his lingering symptom is extreme fatigue and lack of appetite. Patient states he is hydrating- but had very dry mouth. Although patient is out of 5 day window for antiviral treatment- call to office for virtual visit due to high risk for complication status. Patient has been scheduled

## 2021-02-13 NOTE — Telephone Encounter (Signed)
Noted  

## 2021-03-19 ENCOUNTER — Ambulatory Visit (INDEPENDENT_AMBULATORY_CARE_PROVIDER_SITE_OTHER): Payer: Medicare Other

## 2021-03-19 DIAGNOSIS — I428 Other cardiomyopathies: Secondary | ICD-10-CM

## 2021-03-21 LAB — CUP PACEART REMOTE DEVICE CHECK
Battery Remaining Longevity: 26 mo
Battery Voltage: 2.95 V
Brady Statistic AP VP Percent: 86.68 %
Brady Statistic AP VS Percent: 0.41 %
Brady Statistic AS VP Percent: 12.76 %
Brady Statistic AS VS Percent: 0.15 %
Brady Statistic RA Percent Paced: 86.92 %
Brady Statistic RV Percent Paced: 99.33 %
Date Time Interrogation Session: 20221017012506
HighPow Impedance: 71 Ohm
Implantable Lead Implant Date: 20171025
Implantable Lead Implant Date: 20171025
Implantable Lead Location: 753859
Implantable Lead Location: 753860
Implantable Lead Model: 5076
Implantable Pulse Generator Implant Date: 20171025
Lead Channel Impedance Value: 342 Ohm
Lead Channel Impedance Value: 361 Ohm
Lead Channel Impedance Value: 418 Ohm
Lead Channel Pacing Threshold Amplitude: 0.5 V
Lead Channel Pacing Threshold Amplitude: 0.625 V
Lead Channel Pacing Threshold Pulse Width: 0.4 ms
Lead Channel Pacing Threshold Pulse Width: 0.4 ms
Lead Channel Sensing Intrinsic Amplitude: 1.875 mV
Lead Channel Sensing Intrinsic Amplitude: 1.875 mV
Lead Channel Sensing Intrinsic Amplitude: 11.25 mV
Lead Channel Sensing Intrinsic Amplitude: 11.25 mV
Lead Channel Setting Pacing Amplitude: 2 V
Lead Channel Setting Pacing Amplitude: 2.5 V
Lead Channel Setting Pacing Pulse Width: 0.4 ms
Lead Channel Setting Sensing Sensitivity: 0.3 mV

## 2021-03-28 NOTE — Progress Notes (Signed)
Remote ICD transmission.   

## 2021-06-18 ENCOUNTER — Ambulatory Visit (INDEPENDENT_AMBULATORY_CARE_PROVIDER_SITE_OTHER): Payer: Medicare Other

## 2021-06-18 DIAGNOSIS — I428 Other cardiomyopathies: Secondary | ICD-10-CM

## 2021-06-18 DIAGNOSIS — I472 Ventricular tachycardia, unspecified: Secondary | ICD-10-CM | POA: Diagnosis not present

## 2021-06-19 ENCOUNTER — Ambulatory Visit: Payer: Self-pay | Admitting: *Deleted

## 2021-06-19 LAB — CUP PACEART REMOTE DEVICE CHECK
Battery Remaining Longevity: 29 mo
Battery Voltage: 2.94 V
Brady Statistic AP VP Percent: 85.93 %
Brady Statistic AP VS Percent: 0.18 %
Brady Statistic AS VP Percent: 13.83 %
Brady Statistic AS VS Percent: 0.06 %
Brady Statistic RA Percent Paced: 85.9 %
Brady Statistic RV Percent Paced: 99.6 %
Date Time Interrogation Session: 20230117144040
HighPow Impedance: 73 Ohm
Implantable Lead Implant Date: 20171025
Implantable Lead Implant Date: 20171025
Implantable Lead Location: 753859
Implantable Lead Location: 753860
Implantable Lead Model: 5076
Implantable Pulse Generator Implant Date: 20171025
Lead Channel Impedance Value: 342 Ohm
Lead Channel Impedance Value: 361 Ohm
Lead Channel Impedance Value: 418 Ohm
Lead Channel Pacing Threshold Amplitude: 0.5 V
Lead Channel Pacing Threshold Amplitude: 0.625 V
Lead Channel Pacing Threshold Pulse Width: 0.4 ms
Lead Channel Pacing Threshold Pulse Width: 0.4 ms
Lead Channel Sensing Intrinsic Amplitude: 1.625 mV
Lead Channel Sensing Intrinsic Amplitude: 1.625 mV
Lead Channel Sensing Intrinsic Amplitude: 11.25 mV
Lead Channel Sensing Intrinsic Amplitude: 11.25 mV
Lead Channel Setting Pacing Amplitude: 2 V
Lead Channel Setting Pacing Amplitude: 2.5 V
Lead Channel Setting Pacing Pulse Width: 0.4 ms
Lead Channel Setting Sensing Sensitivity: 0.3 mV

## 2021-06-19 NOTE — Telephone Encounter (Signed)
°  Chief Complaint: productive cough Symptoms: cough, yellow sputum Frequency: 3 days Pertinent Negatives: Patient denies fever, congestion, negative COVID test last week Disposition: [] ED /[] Urgent Care (no appt availability in office) / [x] Appointment(In office/virtual)/ []  [facility]/ [] Home Care/ [] Refused Recommended Disposition /[] [facility]/ []  Follow-up with PCP Additional Notes: Patient requesting cough medication- appointment has been scheduled- outside 24 hour protocol

## 2021-06-19 NOTE — Telephone Encounter (Signed)
Summary: ongoing cough cannot rid   Pt states he has a cough he cannot get rid of.  Wanting cough med called into pharmacy.  Willing to do phone call if Corrie Dandy will call him.no phone appts available      Reason for Disposition  [1] Continuous (nonstop) coughing interferes with work or school AND [2] no improvement using cough treatment per Care Advice  Answer Assessment - Initial Assessment Questions 1. ONSET: "When did the cough begin?"      3 days 2. SEVERITY: "How bad is the cough today?"      productive 3. SPUTUM: "Describe the color of your sputum" (none, dry cough; clear, white, yellow, green)     yellow 4. HEMOPTYSIS: "Are you coughing up any blood?" If so ask: "How much?" (flecks, streaks, tablespoons, etc.)     no 5. DIFFICULTY BREATHING: "Are you having difficulty breathing?" If Yes, ask: "How bad is it?" (e.g., mild, moderate, severe)    - MILD: No SOB at rest, mild SOB with walking, speaks normally in sentences, can lie down, no retractions, pulse < 100.    - MODERATE: SOB at rest, SOB with minimal exertion and prefers to sit, cannot lie down flat, speaks in phrases, mild retractions, audible wheezing, pulse 100-120.    - SEVERE: Very SOB at rest, speaks in single words, struggling to breathe, sitting hunched forward, retractions, pulse > 120      No SOB-  coughing 6. FEVER: "Do you have a fever?" If Yes, ask: "What is your temperature, how was it measured, and when did it start?"     no 7. CARDIAC HISTORY: "Do you have any history of heart disease?" (e.g., heart attack, congestive heart failure)      pacemaker 8. LUNG HISTORY: "Do you have any history of lung disease?"  (e.g., pulmonary embolus, asthma, emphysema)     no 9. PE RISK FACTORS: "Do you have a history of blood clots?" (or: recent major surgery, recent prolonged travel, bedridden)     no 10. OTHER SYMPTOMS: "Do you have any other symptoms?" (e.g., runny nose, wheezing, chest pain)       no 11. PREGNANCY: "Is  there any chance you are pregnant?" "When was your last menstrual period?"       *No Answer* 12. TRAVEL: "Have you traveled out of the country in the last month?" (e.g., travel history, exposures)       Negative COVID test- last week  Protocols used: Cough - Acute Productive-A-AH

## 2021-06-19 NOTE — Telephone Encounter (Signed)
Tried to call pt and unable to switch appt

## 2021-06-21 ENCOUNTER — Encounter: Payer: Self-pay | Admitting: Internal Medicine

## 2021-06-21 ENCOUNTER — Other Ambulatory Visit: Payer: Self-pay

## 2021-06-21 ENCOUNTER — Ambulatory Visit: Payer: Medicare Other | Admitting: Internal Medicine

## 2021-06-21 VITALS — BP 112/66 | HR 66 | Temp 97.7°F | Ht 70.87 in | Wt 179.6 lb

## 2021-06-21 DIAGNOSIS — R059 Cough, unspecified: Secondary | ICD-10-CM | POA: Insufficient documentation

## 2021-06-21 LAB — VERITOR FLU A/B WAIVED
Influenza A: NEGATIVE
Influenza B: NEGATIVE

## 2021-06-21 MED ORDER — FEXOFENADINE HCL 180 MG PO TABS: 180.0000 mg | ORAL_TABLET | Freq: Every day | ORAL | 1 refills | Status: DC

## 2021-06-21 MED ORDER — BENZONATATE 100 MG PO CAPS: 100.0000 mg | ORAL_CAPSULE | Freq: Two times a day (BID) | ORAL | 0 refills | Status: DC | PRN

## 2021-06-21 NOTE — Progress Notes (Signed)
BP 112/66    Pulse 66    Temp 97.7 F (36.5 C) (Oral)    Ht 5' 10.87" (1.8 m)    Wt 179 lb 9.6 oz (81.5 kg)    SpO2 96%    BMI 25.14 kg/m    Subjective:    Patient ID: Roy Palmer, male    DOB: [birth year], 88 y.o.   MRN: [id number]  Chief Complaint  Patient presents with   Cough    Patient states that he cough is a lot, lot better in the past few day.     HPI: Roy Palmer is a 88 y.o. male  Had a head cold per pt. Has cough x better x 2 days now.   Cough This is a recurrent problem. The current episode started in the past 7 days. Associated symptoms include nasal congestion. Pertinent negatives include no chest pain, chills, ear congestion, ear pain, fever, headaches, heartburn, hemoptysis, myalgias, postnasal drip, rash, rhinorrhea, sore throat, shortness of breath, sweats, weight loss or wheezing. Associated symptoms comments: Yellow colored phelgm nasal congesiton. There is no history of emphysema or environmental allergies.   Chief Complaint  Patient presents with   Cough    Patient states that he cough is a lot, lot better in the past few day.     Relevant past medical, surgical, family and social history reviewed and updated as indicated. Interim medical history since our last visit reviewed. Allergies and medications reviewed and updated.  Review of Systems  Constitutional:  Negative for chills, fever and weight loss.  HENT:  Negative for ear pain, postnasal drip, rhinorrhea and sore throat.   Respiratory:  Positive for cough. Negative for hemoptysis, shortness of breath and wheezing.   Cardiovascular:  Negative for chest pain.  Gastrointestinal:  Negative for heartburn.  Musculoskeletal:  Negative for myalgias.  Skin:  Negative for rash.  Allergic/Immunologic: Negative for environmental allergies.  Neurological:  Negative for headaches.   Per HPI unless specifically indicated above     Objective:    BP 112/66    Pulse 66    Temp 97.7 F (36.5 C)  (Oral)    Ht 5' 10.87" (1.8 m)    Wt 179 lb 9.6 oz (81.5 kg)    SpO2 96%    BMI 25.14 kg/m   Wt Readings from Last 3 Encounters:  06/21/21 179 lb 9.6 oz (81.5 kg)  10/11/20 181 lb (82.1 kg)  07/17/20 180 lb (81.6 kg)    Physical Exam Vitals and nursing note reviewed.  Constitutional:      General: He is not in acute distress.    Appearance: Normal appearance. He is not ill-appearing or diaphoretic.  HENT:     Nose: No congestion or rhinorrhea.     Mouth/Throat:     Pharynx: No oropharyngeal exudate or posterior oropharyngeal erythema.  Eyes:     Conjunctiva/sclera: Conjunctivae normal.     Pupils: Pupils are equal, round, and reactive to light.  Cardiovascular:     Rate and Rhythm: Normal rate and regular rhythm.     Heart sounds: No murmur heard. Pulmonary:     Effort: No respiratory distress.     Breath sounds: No stridor. No wheezing.  Musculoskeletal:     Cervical back: Neck supple.  Neurological:     Mental Status: He is alert.    Results for orders placed or performed in visit on 06/21/21  Veritor Flu A/B Waived  Result Value Ref Range   Influenza  A Negative Negative   Influenza B Negative Negative        Current Outpatient Medications:    acetaminophen (TYLENOL) 325 MG tablet, Take 325-650 mg by mouth every 6 (six) hours as needed for mild pain, moderate pain or fever. , Disp: , Rfl:    albuterol (VENTOLIN HFA) 108 (90 Base) MCG/ACT inhaler, Inhale 2 puffs into the lungs every 6 (six) hours as needed for wheezing or shortness of breath., Disp: 6.7 g, Rfl: 0   amiodarone (PACERONE) 200 MG tablet, TAKE 1 TABLET BY MOUTH EVERY DAY MONDAY THROUGH SATURDAY. DO NOT TAKE ON SUNDAY, Disp: 90 tablet, Rfl: 0   amLODipine (NORVASC) 2.5 MG tablet, TAKE 1 TABLET(2.5 MG) BY MOUTH DAILY, Disp: 90 tablet, Rfl: 0   aspirin EC 81 MG tablet, Take 1 tablet (81 mg total) by mouth daily., Disp: , Rfl:    atorvastatin (LIPITOR) 10 MG tablet, Take 1 tablet (10 mg total) by mouth  daily., Disp: 90 tablet, Rfl: 0   Multiple Vitamins-Minerals (MULTIVITAMIN ADULTS 50+ PO), Take 1 tablet by mouth daily., Disp: , Rfl:     Assessment & Plan:   COUGH / URI  URI: Flu and COVID ordered at this visit FLU -ve.  pt advised to take Tylenol q 4- 6 hourly as needed. pt to take allegra q pm as needed and to call office if symptoms worsened pt verbalised understanding of such.  Problem List Items Addressed This Visit   None Visit Diagnoses     Cough, unspecified type    -  Primary   Relevant Orders   Veritor Flu A/B Waived (Completed)   Novel Coronavirus, NAA ([facility])        Orders Placed This Encounter  Procedures   Novel Coronavirus, NAA ([facility])   Veritor Flu A/B Waived

## 2021-06-22 LAB — NOVEL CORONAVIRUS, NAA: SARS-CoV-2, NAA: NOT DETECTED

## 2021-06-22 LAB — SARS-COV-2, NAA 2 DAY TAT

## 2021-06-25 NOTE — Progress Notes (Signed)
Please let Veston know his Covid was negative.

## 2021-06-29 NOTE — Progress Notes (Signed)
Remote ICD transmission.   

## 2021-07-20 ENCOUNTER — Ambulatory Visit: Payer: Medicare Other

## 2021-07-24 ENCOUNTER — Ambulatory Visit (INDEPENDENT_AMBULATORY_CARE_PROVIDER_SITE_OTHER): Payer: Medicare Other | Admitting: *Deleted

## 2021-07-24 VITALS — BP 130/75

## 2021-07-24 DIAGNOSIS — Z0000 Encounter for general adult medical examination without abnormal findings: Secondary | ICD-10-CM

## 2021-07-24 NOTE — Patient Instructions (Signed)
[name] , Thank you for taking time to come for your Medicare Wellness Visit. I appreciate your ongoing commitment to your health goals. Please review the following plan we discussed and let me know if I can assist you in the future.   Screening recommendations/referrals: Colonoscopy: no longer required Recommended yearly ophthalmology/optometry visit for glaucoma screening and checkup Recommended yearly dental visit for hygiene and checkup  Vaccinations: Influenza vaccine: Education provided Pneumococcal vaccine: up to date Tdap vaccine: up to date Shingles vaccine: Education provided    Advanced directives: on file  Conditions/risks identified:   Next appointment:   Preventive Care 65 Years and Older, Male Preventive care refers to lifestyle choices and visits with your health care provider that can promote health and wellness. What does preventive care include? A yearly physical exam. This is also called an annual well check. Dental exams once or twice a year. Routine eye exams. Ask your health care provider how often you should have your eyes checked. Personal lifestyle choices, including: Daily care of your teeth and gums. Regular physical activity. Eating a healthy diet. Avoiding tobacco and drug use. Limiting alcohol use. Practicing safe sex. Taking low doses of aspirin every day. Taking vitamin and mineral supplements as recommended by your health care provider. What happens during an annual well check? The services and screenings done by your health care provider during your annual well check will depend on your age, overall health, lifestyle risk factors, and family history of disease. Counseling  Your health care provider may ask you questions about your: Alcohol use. Tobacco use. Drug use. Emotional well-being. Home and relationship well-being. Sexual activity. Eating habits. History of falls. Memory and ability to understand (cognition). Work and work  Astronomer. Screening  You may have the following tests or measurements: Height, weight, and BMI. Blood pressure. Lipid and cholesterol levels. These may be checked every 5 years, or more frequently if you are over 63 years old. Skin check. Lung cancer screening. You may have this screening every year starting at age 87 if you have a 30-pack-year history of smoking and currently smoke or have quit within the past 15 years. Fecal occult blood test (FOBT) of the stool. You may have this test every year starting at age 89. Flexible sigmoidoscopy or colonoscopy. You may have a sigmoidoscopy every 5 years or a colonoscopy every 10 years starting at age 69. Prostate cancer screening. Recommendations will vary depending on your family history and other risks. Hepatitis C blood test. Hepatitis B blood test. Sexually transmitted disease (STD) testing. Diabetes screening. This is done by checking your blood sugar (glucose) after you have not eaten for a while (fasting). You may have this done every 1-3 years. Abdominal aortic aneurysm (AAA) screening. You may need this if you are a current or former smoker. Osteoporosis. You may be screened starting at age 56 if you are at high risk. Talk with your health care provider about your test results, treatment options, and if necessary, the need for more tests. Vaccines  Your health care provider may recommend certain vaccines, such as: Influenza vaccine. This is recommended every year. Tetanus, diphtheria, and acellular pertussis (Tdap, Td) vaccine. You may need a Td booster every 10 years. Zoster vaccine. You may need this after age 6. Pneumococcal 13-valent conjugate (PCV13) vaccine. One dose is recommended after age 34. Pneumococcal polysaccharide (PPSV23) vaccine. One dose is recommended after age 28. Talk to your health care provider about which screenings and vaccines you need and how  often you need them. This information is not intended to replace  advice given to you by your health care provider. Make sure you discuss any questions you have with your health care provider. Document Released: 06/16/2015 Document Revised: 02/07/2016 Document Reviewed: 03/21/2015 Elsevier Interactive Patient Education  2017 [place] Prevention in the Home Falls can cause injuries. They can happen to people of all ages. There are many things you can do to make your home safe and to help prevent falls. What can I do on the outside of my home? Regularly fix the edges of walkways and driveways and fix any cracks. Remove anything that might make you trip as you walk through a door, such as a raised step or threshold. Trim any bushes or trees on the path to your home. Use bright outdoor lighting. Clear any walking paths of anything that might make someone trip, such as rocks or tools. Regularly check to see if handrails are loose or broken. Make sure that both sides of any steps have handrails. Any raised decks and porches should have guardrails on the edges. Have any leaves, snow, or ice cleared regularly. Use sand or salt on walking paths during winter. Clean up any spills in your garage right away. This includes oil or grease spills. What can I do in the bathroom? Use night lights. Install grab bars by the toilet and in the tub and shower. Do not use towel bars as grab bars. Use non-skid mats or decals in the tub or shower. If you need to sit down in the shower, use a plastic, non-slip stool. Keep the floor dry. Clean up any water that spills on the floor as soon as it happens. Remove soap buildup in the tub or shower regularly. Attach bath mats securely with double-sided non-slip rug tape. Do not have throw rugs and other things on the floor that can make you trip. What can I do in the bedroom? Use night lights. Make sure that you have a light by your bed that is easy to reach. Do not use any sheets or blankets that are too big for your bed.  They should not hang down onto the floor. Have a firm chair that has side arms. You can use this for support while you get dressed. Do not have throw rugs and other things on the floor that can make you trip. What can I do in the kitchen? Clean up any spills right away. Avoid walking on wet floors. Keep items that you use a lot in easy-to-reach places. If you need to reach something above you, use a strong step stool that has a grab bar. Keep electrical cords out of the way. Do not use floor polish or wax that makes floors slippery. If you must use wax, use non-skid floor wax. Do not have throw rugs and other things on the floor that can make you trip. What can I do with my stairs? Do not leave any items on the stairs. Make sure that there are handrails on both sides of the stairs and use them. Fix handrails that are broken or loose. Make sure that handrails are as long as the stairways. Check any carpeting to make sure that it is firmly attached to the stairs. Fix any carpet that is loose or worn. Avoid having throw rugs at the top or bottom of the stairs. If you do have throw rugs, attach them to the floor with carpet tape. Make sure that you have a  light switch at the top of the stairs and the bottom of the stairs. If you do not have them, ask someone to add them for you. What else can I do to help prevent falls? Wear shoes that: Do not have high heels. Have rubber bottoms. Are comfortable and fit you well. Are closed at the toe. Do not wear sandals. If you use a stepladder: Make sure that it is fully opened. Do not climb a closed stepladder. Make sure that both sides of the stepladder are locked into place. Ask someone to hold it for you, if possible. Clearly mark and make sure that you can see: Any grab bars or handrails. First and last steps. Where the edge of each step is. Use tools that help you move around (mobility aids) if they are needed. These  include: Canes. Walkers. Scooters. Crutches. Turn on the lights when you go into a dark area. Replace any light bulbs as soon as they burn out. Set up your furniture so you have a clear path. Avoid moving your furniture around. If any of your floors are uneven, fix them. If there are any pets around you, be aware of where they are. Review your medicines with your doctor. Some medicines can make you feel dizzy. This can increase your chance of falling. Ask your doctor what other things that you can do to help prevent falls. This information is not intended to replace advice given to you by your health care provider. Make sure you discuss any questions you have with your health care provider. Document Released: 03/16/2009 Document Revised: 10/26/2015 Document Reviewed: 06/24/2014 Elsevier Interactive Patient Education  2017 [employer].

## 2021-07-24 NOTE — Progress Notes (Signed)
Subjective:   Roy Palmer is a 88 y.o. male who presents for Medicare Annual/Subsequent preventive examination.  I connected with  [name] on 07/24/21 by a telephone enabled telemedicine application and verified that I am speaking with the correct person using two identifiers.   I discussed the limitations of evaluation and management by telemedicine. The patient expressed understanding and agreed to proceed.  Patient location: home  Provider location: Tele-Health not in office  Review of Systems     Cardiac Risk Factors include: advanced age (>69men, >64 women);male gender;family history of premature cardiovascular disease;hypertension     Objective:    Today's Vitals   There is no height or weight on file to calculate BMI.  Advanced Directives 07/24/2021 07/17/2020 12/21/2019 12/21/2019 10/21/2019 10/20/2019 07/14/2019  Does Patient Have a Medical Advance Directive? Yes Yes No No Yes Yes Yes  Type of Industrial/product designer of [place];Living will - - [place] will;Healthcare Power of Attorney  Does patient want to make changes to medical advance directive? - - - - No - Patient declined No - Patient declined -  Copy of [place] in Chart? Yes - validated most recent copy scanned in chart (See row information) No - copy requested - - No - copy requested No - copy requested No - copy requested  Would patient like information on creating a medical advance directive? - - No - Patient declined No - Patient declined - - -    Current Medications (verified) Outpatient Encounter Medications as of 07/24/2021  Medication Sig   acetaminophen (TYLENOL) 325 MG tablet Take 325-650 mg by mouth every 6 (six) hours as needed for mild pain, moderate pain or fever.    albuterol (VENTOLIN HFA) 108 (90 Base) MCG/ACT inhaler Inhale 2 puffs into the lungs every 6 (six)  hours as needed for wheezing or shortness of breath.   amiodarone (PACERONE) 200 MG tablet TAKE 1 TABLET BY MOUTH EVERY DAY MONDAY THROUGH SATURDAY. DO NOT TAKE ON SUNDAY   amLODipine (NORVASC) 2.5 MG tablet TAKE 1 TABLET(2.5 MG) BY MOUTH DAILY   aspirin EC 81 MG tablet Take 1 tablet (81 mg total) by mouth daily.   atorvastatin (LIPITOR) 10 MG tablet Take 1 tablet (10 mg total) by mouth daily.   benzonatate (TESSALON) 100 MG capsule Take 1 capsule (100 mg total) by mouth 2 (two) times daily as needed for cough.   fexofenadine (ALLEGRA ALLERGY) 180 MG tablet Take 1 tablet (180 mg total) by mouth daily.   Multiple Vitamins-Minerals (MULTIVITAMIN ADULTS 50+ PO) Take 1 tablet by mouth daily.   No facility-administered encounter medications on file as of 07/24/2021.    Allergies (verified) Amiodarone and Lyrica [pregabalin]   History: Past Medical History:  Diagnosis Date   AAA (abdominal aortic aneurysm) without rupture 03/12/2016   Small - measured 3.3 x 3.5 cm by CTA   CKD (chronic kidney disease) stage 3, GFR 30-59 ml/min (HCC)    Essential hypertension    Hyperlipidemia    Hypertensive kidney disease    Incidental pulmonary nodule 03/12/2016   Vague opacity right lung base requires radiographic follow up - index of suspicion is LOW   Near syncope    Cardiogenic, related to an SVT and severe MR   Non-sustained ventricular tachycardia    S/P minimally invasive mitral valve repair 03/19/2016   Complex valvuloplasty including triangular resection of flail segment of  posterior leaflet, chordal transposition x1, artificial Gore-tex neochord placement x4 and 34 mm Sorin Memo 3D ring annuloplasty via right mini thoracotomy approach with clipping of LA appendage   Severe mitral regurgitation by prior echocardiogram 02/23/2016   Confirmed by TEE   Syncope 03/15/2016   Tennis elbow left   Thoracic aortic aneurysm 03/12/2016   Very very mild fusiform enlargement of distal transverse and  proximal descending thoracic aorta noted on CTA   Trigeminal neuralgia    Past Surgical History:  Procedure Laterality Date   CARDIAC CATHETERIZATION N/A 02/23/2016   Procedure: Right/Left Heart Cath and Coronary Angiography;  Surgeon: Nelva Bush, MD;  Location: [facility];  Service: Cardiovascular: Angiographic minimal coronary disease. Normal left and right heart Pressures. Decreased CO/CI in settng of frequent PVCs & Severe MR.   CATARACT EXTRACTION, BILATERAL     COLONOSCOPY  08/2005   CYST EXCISION  10/2013   from neck   EP IMPLANTABLE DEVICE N/A 03/27/2016   Procedure: ICD Implant Dual Chamber;  Surgeon: Evans Lance, MD;  Location: [facility];  Service: Cardiovascular;  Laterality: N/A;   ESOPHAGOGASTRODUODENOSCOPY  02/2010   MITRAL VALVE REPAIR Right 03/19/2016   Procedure: MINIMALLY INVASIVE MITRAL VALVE REPAIR (MVR);  Surgeon: Rexene Alberts, MD;  Location: [facility];  Service: Open Heart Surgery;  Laterality: Right;   TEE WITHOUT CARDIOVERSION N/A 02/23/2016   Procedure: TRANSESOPHAGEAL ECHOCARDIOGRAM (TEE);  Surgeon: Sueanne Margarita, MD;  Location: [facility];  Service: Cardiovascular: Normal LV size and function.  Degenerative mitral valve disease with failed posterior leaflet (P2 segment), Severe MR with pulmonary vein systolic flow reversal. Moderate-TR.     TEE WITHOUT CARDIOVERSION N/A 03/19/2016   Procedure: TRANSESOPHAGEAL ECHOCARDIOGRAM (TEE);  Surgeon: Rexene Alberts, MD;  Location: [facility];  Service: Open Heart Surgery;  Laterality: N/A;   TRANSTHORACIC ECHOCARDIOGRAM  02/14/2016   EF 50-55% with moderate LVH. Possible inferolateral hypokinesis. Moderate-severe MR with posterior leaflet prolapse, cannot rule out flail. Severe LA dilation. Moderate TR.   Family History  Problem Relation Age of Onset   Hypertension Mother    Stroke Mother    Heart disease Father    Heart attack Father    Dementia Sister    Retinoblastoma Son    COPD Neg Hx     Cancer Neg Hx    Diabetes Neg Hx    Social History   Socioeconomic History   Marital status: Married    Spouse name: Not on file   Number of children: Not on file   Years of education: Not on file   Highest education level: Associate degree: academic program  Occupational History   Occupation: retired  Tobacco Use   Smoking status: Never   Smokeless tobacco: Never  Vaping Use   Vaping Use: Never used  Substance and Sexual Activity   Alcohol use: No   Drug use: No   Sexual activity: Not Currently  Other Topics Concern   Not on file  Social History Narrative   Not on file   Social Determinants of Health   Financial Resource Strain: Low Risk    Difficulty of Paying Living Expenses: Not hard at all  Food Insecurity: No Food Insecurity   Worried About Charity fundraiser in the Last Year: Never true   Ran Out of Food in the Last Year: Never true  Transportation Needs: Not on file  Physical Activity: Not on file  Stress: No Stress Concern Present   Feeling  of Stress : Not at all  Social Connections: Moderately Integrated   Frequency of Communication with Friends and Family: More than three times a week   Frequency of Social Gatherings with Friends and Family: More than three times a week   Attends Religious Services: More than 4 times per year   Active Member of [employer] or Organizations: No   Attends Music therapist: Never   Marital Status: Married    Tobacco Counseling Counseling given: Not Answered   Clinical Intake:  Pre-visit preparation completed: Yes  Pain : No/denies pain     Nutritional Risks: None Diabetes: No  How often do you need to have someone help you when you read instructions, pamphlets, or other written materials from your doctor or pharmacy?: 1 - Never  Diabetic? no  Interpreter Needed?: No  Information entered by :: Leroy Kennedy LPN   Activities of Daily Living In your present state of health, do you have any difficulty  performing the following activities: 07/24/2021  Hearing? N  Vision? N  Difficulty concentrating or making decisions? N  Walking or climbing stairs? N  Dressing or bathing? N  Doing errands, shopping? N  Preparing Food and eating ? N  Using the Toilet? N  In the past six months, have you accidently leaked urine? N  Do you have problems with loss of bowel control? N  Managing your Medications? N  Managing your Finances? N  Some recent data might be hidden    Patient Care Team: Venita Lick, NP as PCP - General (Nurse Practitioner) End, Harrell Gave, MD as Consulting Physician (Cardiology) Evans Lance, MD as Consulting Physician (Cardiology)  Indicate any recent Medical Services you may have received from other than [facility] providers in the past year (date may be approximate).     Assessment:   This is a routine wellness examination for Roy Palmer.  Hearing/Vision screen Hearing Screening - Comments:: Not trouble hearing Vision Screening - Comments:: [facility] Up to date  Dietary issues and exercise activities discussed: Current Exercise Habits: Home exercise routine, Type of exercise: Other - see comments (stationary bike), Time (Minutes): 40, Frequency (Times/Week): 4, Weekly Exercise (Minutes/Week): 160   Goals Addressed   None    Depression Screen PHQ 2/9 Scores 06/21/2021 07/17/2020 07/14/2019 07/22/2018 07/08/2018 06/11/2017 06/24/2016  PHQ - 2 Score 0 0 0 0 0 0 0  PHQ- 9 Score 1 - - 0 - - 0    Fall Risk Fall Risk  07/24/2021 06/21/2021 07/17/2020 07/14/2019 07/22/2018  Falls in the past year? 0 0 0 0 0  Number falls in past yr: 0 0 - 0 0  Injury with Fall? 0 0 - 0 0  Risk for fall due to : - No Fall Risks Medication side effect - -  Follow up Falls evaluation completed;Falls prevention discussed Falls evaluation completed Falls evaluation completed;Education provided;Falls prevention discussed - -    FALL RISK PREVENTION PERTAINING TO THE HOME:  Any stairs in or  around the home? Yes  If so, are there any without handrails? No  Home free of loose throw rugs in walkways, pet beds, electrical cords, etc? Yes  Adequate lighting in your home to reduce risk of falls? Yes   ASSISTIVE DEVICES UTILIZED TO PREVENT FALLS:  Life alert? No  Use of a cane, walker or w/c? No  Grab bars in the bathroom? Yes  Shower chair or bench in shower? Yes  Elevated toilet seat or a handicapped toilet? Yes  TIMED UP AND GO:  Was the test performed? No .    Cognitive Function:    Normal cognitive status assessed by direct observation by this Nurse Health Advisor. No abnormalities found.     6CIT Screen 07/17/2020 07/08/2018 06/11/2017  What Year? 0 points 0 points 0 points  What month? 0 points 0 points 0 points  What time? 0 points 0 points 0 points  Count back from 20 0 points 0 points 0 points  Months in reverse 2 points 0 points 0 points  Repeat phrase 6 points 0 points 2 points  Total Score 8 0 2    Immunizations Immunization History  Administered Date(s) Administered   Fluad Quad(high Dose 65+) 03/19/2019   Influenza,inj,Quad PF,6+ Mos 05/07/2016, 02/19/2017, 04/17/2018   Influenza-Unspecified 04/19/2015   PFIZER(Purple Top)SARS-COV-2 Vaccination 08/25/2019, 09/15/2019   Pneumococcal Conjugate-13 10/29/2013   Pneumococcal Polysaccharide-23 07/14/2008   Td 07/14/2008, 01/20/2019   Zoster, Live 06/03/2010    TDAP status: Up to date  Flu Vaccine status: Declined, Education has been provided regarding the importance of this vaccine but patient still declined. Advised may receive this vaccine at local pharmacy or Health Dept. Aware to provide a copy of the vaccination record if obtained from local pharmacy or Health Dept. Verbalized acceptance and understanding.  Pneumococcal vaccine status: Up to date  Covid-19 vaccine status: Declined, Education has been provided regarding the importance of this vaccine but patient still declined. Advised may receive  this vaccine at local pharmacy or Health Dept.or vaccine clinic. Aware to provide a copy of the vaccination record if obtained from local pharmacy or Health Dept. Verbalized acceptance and understanding.  Qualifies for Shingles Vaccine? Yes   Zostavax completed Yes   Shingrix Completed?: No.    Education has been provided regarding the importance of this vaccine. Patient has been advised to call insurance company to determine out of pocket expense if they have not yet received this vaccine. Advised may also receive vaccine at local pharmacy or Health Dept. Verbalized acceptance and understanding.  Screening Tests Health Maintenance  Topic Date Due   COVID-19 Vaccine (3 - Booster for Pfizer series) 08/09/2021 (Originally 11/10/2019)   INFLUENZA VACCINE  08/31/2021 (Originally 01/01/2021)   Zoster Vaccines- Shingrix (1 of 2) 10/21/2021 (Originally 02/03/1982)   TETANUS/TDAP  01/19/2029   Pneumonia Vaccine 19+ Years old  Completed   HPV VACCINES  Aged Out    Health Maintenance  There are no preventive care reminders to display for this patient.   Colorectal cancer screening: No longer required.   Lung Cancer Screening: (Low Dose CT Chest recommended if Age 39-80 years, 30 pack-year currently smoking OR have quit w/in 15years.) does not qualify.   Lung Cancer Screening Referral:   Additional Screening:  Hepatitis C Screening: does not qualify; C  Vision Screening: Recommended annual ophthalmology exams for early detection of glaucoma and other disorders of the eye. Is the patient up to date with their annual eye exam?  Yes  Who is the provider or what is the name of the office in which the patient attends annual eye exams? [facility] If pt is not established with a provider, would they like to be referred to a provider to establish care? No .   Dental Screening: Recommended annual dental exams for proper oral hygiene  Community Resource Referral / Chronic Care Management: CRR  required this visit?  No   CCM required this visit?  No      Plan:  I have personally reviewed and noted the following in the patients chart:   Medical and social history Use of alcohol, tobacco or illicit drugs  Current medications and supplements including opioid prescriptions. Patient is not currently taking opioid prescriptions. Functional ability and status Nutritional status Physical activity Advanced directives List of other physicians Hospitalizations, surgeries, and ER visits in previous 12 months Vitals Screenings to include cognitive, depression, and falls Referrals and appointments  In addition, I have reviewed and discussed with patient certain preventive protocols, quality metrics, and best practice recommendations. A written personalized care plan for preventive services as well as general preventive health recommendations were provided to patient.     Leroy Kennedy, LPN   [phone]   Nurse Notes:

## 2021-07-28 ENCOUNTER — Other Ambulatory Visit: Payer: Self-pay | Admitting: Internal Medicine

## 2021-07-28 DIAGNOSIS — I5022 Chronic systolic (congestive) heart failure: Secondary | ICD-10-CM

## 2021-07-28 DIAGNOSIS — I428 Other cardiomyopathies: Secondary | ICD-10-CM

## 2021-07-28 DIAGNOSIS — Z95810 Presence of automatic (implantable) cardiac defibrillator: Secondary | ICD-10-CM

## 2021-07-28 DIAGNOSIS — I472 Ventricular tachycardia, unspecified: Secondary | ICD-10-CM

## 2021-07-30 ENCOUNTER — Telehealth: Payer: Self-pay | Admitting: Internal Medicine

## 2021-07-30 DIAGNOSIS — Z95810 Presence of automatic (implantable) cardiac defibrillator: Secondary | ICD-10-CM

## 2021-07-30 DIAGNOSIS — I472 Ventricular tachycardia, unspecified: Secondary | ICD-10-CM

## 2021-07-30 DIAGNOSIS — I428 Other cardiomyopathies: Secondary | ICD-10-CM

## 2021-07-30 DIAGNOSIS — I5022 Chronic systolic (congestive) heart failure: Secondary | ICD-10-CM

## 2021-07-30 MED ORDER — AMLODIPINE BESYLATE 2.5 MG PO TABS: ORAL_TABLET | ORAL | 0 refills | Status: DC

## 2021-07-30 MED ORDER — AMIODARONE HCL 200 MG PO TABS: ORAL_TABLET | ORAL | 0 refills | Status: DC

## 2021-07-30 NOTE — Telephone Encounter (Signed)
°*  STAT* If patient is at the pharmacy, call can be transferred to refill team.   1. Which medications need to be refilled? Amiodarone 200 mg and Amlodipine 2.5mg   (please list name of each medication and dose if known)   2. Which pharmacy/location (including street and city if local pharmacy) is medication to be sent to? [facility]  3. Do they need a 30 day or 90 day supply? 90 day supply

## 2021-07-30 NOTE — Telephone Encounter (Signed)
Scheduled

## 2021-07-30 NOTE — Telephone Encounter (Signed)
Requested Prescriptions   Signed Prescriptions Disp Refills   amiodarone (PACERONE) 200 MG tablet 90 tablet 0    Sig: TAKE 1 TABLET BY MOUTH EVERY DAY MONDAY THROUGH SATURDAY. DO NOT TAKE ON SUNDAY    Authorizing Provider: END, CHRISTOPHER    Ordering User: NEWCOMER MCCLAIN, Jarah Pember L   amLODipine (NORVASC) 2.5 MG tablet 90 tablet 0    Sig: TAKE 1 TABLET(2.5 MG) BY MOUTH DAILY    Authorizing Provider: END, CHRISTOPHER    Ordering User: NEWCOMER MCCLAIN, Terrin Imparato L

## 2021-07-30 NOTE — Telephone Encounter (Signed)
Please contact pt for future appointment. Pt overdue for 6 month f/u.  

## 2021-08-28 ENCOUNTER — Ambulatory Visit: Payer: Medicare Other | Admitting: Medical

## 2021-09-06 ENCOUNTER — Encounter: Payer: Self-pay | Admitting: Medical

## 2021-09-06 ENCOUNTER — Ambulatory Visit (INDEPENDENT_AMBULATORY_CARE_PROVIDER_SITE_OTHER): Payer: Medicare Other | Admitting: Medical

## 2021-09-06 VITALS — BP 130/70 | HR 70 | Ht 71.0 in | Wt 180.0 lb

## 2021-09-06 DIAGNOSIS — I428 Other cardiomyopathies: Secondary | ICD-10-CM | POA: Diagnosis not present

## 2021-09-06 DIAGNOSIS — Z95810 Presence of automatic (implantable) cardiac defibrillator: Secondary | ICD-10-CM | POA: Diagnosis not present

## 2021-09-06 DIAGNOSIS — E782 Mixed hyperlipidemia: Secondary | ICD-10-CM

## 2021-09-06 DIAGNOSIS — Z9889 Other specified postprocedural states: Secondary | ICD-10-CM

## 2021-09-06 NOTE — Patient Instructions (Addendum)
Medication Instructions:  ?Your physician recommends that you continue on your current medications as directed. Please refer to the Current Medication list given to you today. ? ?*If you need a refill on your cardiac medications before your next appointment, please call your pharmacy* ? ? ?Lab Work: ?Cmp, Cbc, Lipid, Direct LDL same day as echo. ? ?If you have labs (blood work) drawn today and your tests are completely normal, you will receive your results only by: ?MyChart Message (if you have MyChart) OR ?A paper copy in the mail ?If you have any lab test that is abnormal or we need to change your treatment, we will call you to review the results. ? ? ?Testing/Procedures: ?Your physician has requested that you have an echocardiogram. Echocardiography is a painless test that uses sound waves to create images of your heart. It provides your doctor with information about the size and shape of your heart and how well your heart?s chambers and valves are working. This procedure takes approximately one hour. There are no restrictions for this procedure. ? ? ? ?Follow-Up: ?At [facility], you and your health needs are our priority.  As part of our continuing mission to provide you with exceptional heart care, we have created designated Provider Care Teams.  These Care Teams include your primary Cardiologist (physician) and Advanced Practice Providers (APPs -  Physician Assistants and Nurse Practitioners) who all work together to provide you with the care you need, when you need it. ? ?We recommend signing up for the patient portal called "MyChart".  Sign up information is provided on this After Visit Summary.  MyChart is used to connect with patients for Virtual Visits (Telemedicine).  Patients are able to view lab/test results, encounter notes, upcoming appointments, etc.  Non-urgent messages can be sent to your provider as well.   ?To learn more about what you can do with MyChart, go to [email or web address].    ? ?Your next appointment:   ?Your physician wants you to follow-up in: 1 year You will receive a reminder letter in the mail two months in advance. If you don't receive a letter, please call our office to schedule the follow-up appointment. ? ? ?The format for your next appointment:   ?In Person ? ?Provider:   ?You may see Nelva Bush, MD or one of the following Advanced Practice Providers on your designated Care Team:   ?Murray Hodgkins, NP ?Christell Faith, PA-C ?Cadence Kathlen Mody, PA-C ? ? ?Other Instructions ?N/A ? ?

## 2021-09-06 NOTE — Progress Notes (Signed)
?Cardiology Office Note:   ? ?Date:  09/10/2021  ? ?ID:  Roy Palmer, DOB 24-Jan-1930, MRN [id number] ? ?PCP:  Venita Lick, NP  ?[facility] Cardiologist:  Dr. Saunders Revel ?[place] Electrophysiologist:  None  ? ?Referring MD: Venita Lick, NP  ? ?Chief Complaint: 6 month follow-up ? ?History of Present Illness:   ? ?Roy Palmer is a 88 y.o. male with a hx of severe MR s/p MV repair 05/2016, VT s/p ICD 03/2016, NICM, chronic HFrEF, nonobstructive CAD ny caht in 2017, HTN, CKD stage 3 who presents for 6 month follow-up.  ? ?Hospitalized in 12/2019 with concern for stroke in the setting of facial droop, though was ultimately diagnosed with Bell's palsy. Echo at that time showed LVEF 45-50%.  ? ?Seen 10/11/20 and was overall doing well.  ? ?Today, the patient reports he has been doing well since the last visit. No chest pain or SOB. No LLE, orthopnea, pnd, palpitations. BP is good. EKG shows AV paced rhythm. He is tolerating medications well. Diet is good. He starys fairly active. He plans on seeing  EP for generator changer out.  ? ?Past Medical History:  ?Diagnosis Date  ? AAA (abdominal aortic aneurysm) without rupture ([place]) 03/12/2016  ? Small - measured 3.3 x 3.5 cm by CTA  ? CKD (chronic kidney disease) stage 3, GFR 30-59 ml/min (HCC)   ? Essential hypertension   ? Hyperlipidemia   ? Hypertensive kidney disease   ? Incidental pulmonary nodule 03/12/2016  ? Vague opacity right lung base requires radiographic follow up - index of suspicion is LOW  ? Near syncope   ? Cardiogenic, related to an SVT and severe MR  ? Non-sustained ventricular tachycardia ([place])   ? S/P minimally invasive mitral valve repair 03/19/2016  ? Complex valvuloplasty including triangular resection of flail segment of posterior leaflet, chordal transposition x1, artificial Gore-tex neochord placement x4 and 34 mm Sorin Memo 3D ring annuloplasty via right mini thoracotomy approach with clipping of LA appendage  ? Severe mitral  regurgitation by prior echocardiogram 02/23/2016  ? Confirmed by TEE  ? Syncope 03/15/2016  ? Tennis elbow left  ? Thoracic aortic aneurysm ([place]) 03/12/2016  ? Very very mild fusiform enlargement of distal transverse and proximal descending thoracic aorta noted on CTA  ? Trigeminal neuralgia   ? ? ?Past Surgical History:  ?Procedure Laterality Date  ? CARDIAC CATHETERIZATION N/A 02/23/2016  ? Procedure: Right/Left Heart Cath and Coronary Angiography;  Surgeon: Nelva Bush, MD;  Location: [place] [facility];  Service: Cardiovascular: Angiographic minimal coronary disease. Normal left and right heart Pressures. Decreased CO/CI in settng of frequent PVCs & Severe MR.  ? CATARACT EXTRACTION, BILATERAL    ? COLONOSCOPY  08/2005  ? CYST EXCISION  10/2013  ? from neck  ? EP IMPLANTABLE DEVICE N/A 03/27/2016  ? Procedure: ICD Implant Dual Chamber;  Surgeon: Evans Lance, MD;  Location: [place] CV LAB;  Service: Cardiovascular;  Laterality: N/A;  ? ESOPHAGOGASTRODUODENOSCOPY  02/2010  ? MITRAL VALVE REPAIR Right 03/19/2016  ? Procedure: MINIMALLY INVASIVE MITRAL VALVE REPAIR (MVR);  Surgeon: Rexene Alberts, MD;  Location: [place];  Service: Open Heart Surgery;  Laterality: Right;  ? TEE WITHOUT CARDIOVERSION N/A 02/23/2016  ? Procedure: TRANSESOPHAGEAL ECHOCARDIOGRAM (TEE);  Surgeon: Sueanne Margarita, MD;  Location[facility];  Service: Cardiovascular: Normal LV size and function.  Degenerative mitral valve disease with failed posterior leaflet (P2 segment), Severe MR with pulmonary vein systolic  flow reversal. Moderate-TR.    ? TEE WITHOUT CARDIOVERSION N/A 03/19/2016  ? Procedure: TRANSESOPHAGEAL ECHOCARDIOGRAM (TEE);  Surgeon: Rexene Alberts, MD;  Location: [place];  Service: Open Heart Surgery;  Laterality: N/A;  ? TRANSTHORACIC ECHOCARDIOGRAM  02/14/2016  ? EF 50-55% with moderate LVH. Possible inferolateral hypokinesis. Moderate-severe MR with posterior leaflet prolapse, cannot rule out flail. Severe LA  dilation. Moderate TR.  ? ? ?Current Medications: ?Current Meds  ?Medication Sig  ? acetaminophen (TYLENOL) 325 MG tablet Take 325-650 mg by mouth every 6 (six) hours as needed for mild pain, moderate pain or fever.   ? albuterol (VENTOLIN HFA) 108 (90 Base) MCG/ACT inhaler Inhale 2 puffs into the lungs every 6 (six) hours as needed for wheezing or shortness of breath.  ? amiodarone (PACERONE) 200 MG tablet TAKE 1 TABLET BY MOUTH EVERY DAY MONDAY THROUGH SATURDAY. DO NOT TAKE ON SUNDAY  ? amLODipine (NORVASC) 2.5 MG tablet TAKE 1 TABLET(2.5 MG) BY MOUTH DAILY  ? aspirin EC 81 MG tablet Take 1 tablet (81 mg total) by mouth daily.  ? benzonatate (TESSALON) 100 MG capsule Take 1 capsule (100 mg total) by mouth 2 (two) times daily as needed for cough.  ? fexofenadine (ALLEGRA ALLERGY) 180 MG tablet Take 1 tablet (180 mg total) by mouth daily.  ? Multiple Vitamins-Minerals (MULTIVITAMIN ADULTS 50+ PO) Take 1 tablet by mouth daily.  ?  ? ?Allergies:   Amiodarone and Lyrica [pregabalin]  ? ?Social History  ? ?Socioeconomic History  ? Marital status: Married  ?  Spouse name: Not on file  ? Number of children: Not on file  ? Years of education: Not on file  ? Highest education level: Associate degree: academic program  ?Occupational History  ? Occupation: retired  ?Tobacco Use  ? Smoking status: Never  ? Smokeless tobacco: Never  ?Vaping Use  ? Vaping Use: Never used  ?Substance and Sexual Activity  ? Alcohol use: No  ? Drug use: No  ? Sexual activity: Not Currently  ?Other Topics Concern  ? Not on file  ?Social History Narrative  ? Not on file  ? ?Social Determinants of Health  ? ?Financial Resource Strain: Low Risk   ? Difficulty of Paying Living Expenses: Not hard at all  ?Food Insecurity: No Food Insecurity  ? Worried About Charity fundraiser in the Last Year: Never true  ? Ran Out of Food in the Last Year: Never true  ?Transportation Needs: Not on file  ?Physical Activity: Not on file  ?Stress: No Stress Concern  Present  ? Feeling of Stress : Not at all  ?Social Connections: Moderately Integrated  ? Frequency of Communication with Friends and Family: More than three times a week  ? Frequency of Social Gatherings with Friends and Family: More than three times a week  ? Attends Religious Services: More than 4 times per year  ? Active Member of Clubs or Organizations: No  ? Attends Archivist Meetings: Never  ? Marital Status: Married  ?  ? ?Family History: ?The patient's family history includes Dementia in his sister; Heart attack in his father; Heart disease in his father; Hypertension in his mother; Retinoblastoma in his son; Stroke in his mother. There is no history of COPD, Cancer, or Diabetes. ? ?ROS:   ?Please see the history of present illness.    ? All other systems reviewed and are negative. ? ?EKGs/Labs/Other Studies Reviewed:   ? ?The following studies were reviewed today: ? ?Echo  12/2019 ? 1. Left ventricular ejection fraction, by estimation, is 45 to 50%. The  ?left ventricle has mildly decreased function. The left ventricle  ?demonstrates global hypokinesis. The left ventricular internal cavity size  ?was upper normal in size. There is  ?moderate left ventricular hypertrophy. Left ventricular diastolic  ?parameters are indeterminate.  ? 2. Right ventricular systolic function is normal. The right ventricular  ?size is mildly enlarged. Mildly increased right ventricular wall  ?thickness. There is normal pulmonary artery systolic pressure.  ? 3. The mitral valve has been repaired/replaced. Mild mitral valve  ?regurgitation. No evidence of mitral stenosis. The mean mitral valve  ?gradient is 3.0 mmHg.  ? 4. The aortic valve is tricuspid. Aortic valve regurgitation is trivial.  ?Mild to moderate aortic valve sclerosis/calcification is present, without  ?any evidence of aortic stenosis.  ? 5. Aortic dilatation noted. There is mild dilatation of the aortic root  ?measuring 41 mm.  ? 6. The inferior vena cava  is normal in size with greater than 50%  ?respiratory variability, suggesting right atrial pressure of 3 mmHg.  ? ?Echo 2018 ?Study Conclusions  ? ?- Left ventricle: The cavity size was mildly dilated. Wall  ?  thickness

## 2021-09-12 ENCOUNTER — Other Ambulatory Visit (INDEPENDENT_AMBULATORY_CARE_PROVIDER_SITE_OTHER): Payer: Medicare Other

## 2021-09-12 ENCOUNTER — Ambulatory Visit (INDEPENDENT_AMBULATORY_CARE_PROVIDER_SITE_OTHER): Payer: Medicare Other

## 2021-09-12 DIAGNOSIS — I428 Other cardiomyopathies: Secondary | ICD-10-CM | POA: Diagnosis not present

## 2021-09-12 DIAGNOSIS — Z9889 Other specified postprocedural states: Secondary | ICD-10-CM | POA: Diagnosis not present

## 2021-09-12 DIAGNOSIS — E782 Mixed hyperlipidemia: Secondary | ICD-10-CM

## 2021-09-12 LAB — ECHOCARDIOGRAM COMPLETE
AR max vel: 3.02 cm2
AV Area VTI: 3.28 cm2
AV Area mean vel: 2.79 cm2
AV Mean grad: 4 mmHg
AV Peak grad: 7.1 mmHg
Ao pk vel: 1.33 m/s
Area-P 1/2: 3.07 cm2
Calc EF: 44.7 %
MV VTI: 2.41 cm2
S' Lateral: 5.1 cm
Single Plane A2C EF: 43.2 %
Single Plane A4C EF: 43.3 %

## 2021-09-13 LAB — COMPREHENSIVE METABOLIC PANEL
ALT: 18 IU/L (ref 0–44)
AST: 17 IU/L (ref 0–40)
Albumin/Globulin Ratio: 2.5 — ABNORMAL HIGH (ref 1.2–2.2)
Albumin: 4.3 g/dL (ref 3.6–4.6)
Alkaline Phosphatase: 59 IU/L (ref 44–121)
BUN/Creatinine Ratio: 15 (ref 10–24)
BUN: 28 mg/dL — ABNORMAL HIGH (ref 8–27)
Bilirubin Total: 0.3 mg/dL (ref 0.0–1.2)
CO2: 23 mmol/L (ref 20–29)
Calcium: 8.8 mg/dL (ref 8.6–10.2)
Chloride: 103 mmol/L (ref 96–106)
Creatinine, Ser: 1.82 mg/dL — ABNORMAL HIGH (ref 0.76–1.27)
Globulin, Total: 1.7 g/dL (ref 1.5–4.5)
Glucose: 94 mg/dL (ref 70–99)
Potassium: 4.9 mmol/L (ref 3.5–5.2)
Sodium: 140 mmol/L (ref 134–144)
Total Protein: 6 g/dL (ref 6.0–8.5)
eGFR: 35 mL/min/{1.73_m2} — ABNORMAL LOW (ref >59)

## 2021-09-13 LAB — LIPID PANEL
Chol/HDL Ratio: 4.6 ratio (ref 0.0–5.0)
Cholesterol, Total: 192 mg/dL (ref 100–199)
HDL: 42 mg/dL (ref >39)
LDL Chol Calc (NIH): 118 mg/dL — ABNORMAL HIGH (ref 0–99)
Triglycerides: 182 mg/dL — ABNORMAL HIGH (ref 0–149)
VLDL Cholesterol Cal: 32 mg/dL (ref 5–40)

## 2021-09-13 LAB — LDL CHOLESTEROL, DIRECT: LDL Direct: 125 mg/dL — ABNORMAL HIGH (ref 0–99)

## 2021-09-14 ENCOUNTER — Telehealth: Payer: Self-pay

## 2021-09-14 NOTE — Telephone Encounter (Signed)
-----   Message from Cadence David Stall, PA-C sent at 09/14/2021  1:58 PM EDT ----- ?Echo showed LVEF 35-40% (which is lower than prior), impaired relaxation, moderate leaky valve. Lets stop amlodipine for low EF. Start Toprol 25mg  daily. Would like to see the patient back in clinic to discuss medications for reduced heart function.  ?

## 2021-09-14 NOTE — Telephone Encounter (Signed)
Roy Palmer, Cadence H, PA-C  ?Labs show patient is a little dehydration. Also has chronic kidney disease, which is know, should establish with nephrology if has not already.  ?Bad cholesterol was high at 125, goal <70. Increase Lipitor to 80mg  daily ?

## 2021-09-14 NOTE — Telephone Encounter (Addendum)
Called to give the patient echo and lab results. Unable to lmom. Patients voicemail is full. ?

## 2021-09-17 ENCOUNTER — Ambulatory Visit (INDEPENDENT_AMBULATORY_CARE_PROVIDER_SITE_OTHER): Payer: Medicare Other

## 2021-09-17 DIAGNOSIS — I428 Other cardiomyopathies: Secondary | ICD-10-CM

## 2021-09-18 LAB — CUP PACEART REMOTE DEVICE CHECK
Battery Remaining Longevity: 27 mo
Battery Voltage: 2.94 V
Brady Statistic AP VP Percent: 86.49 %
Brady Statistic AP VS Percent: 0.02 %
Brady Statistic AS VP Percent: 13.47 %
Brady Statistic AS VS Percent: 0.02 %
Brady Statistic RA Percent Paced: 86.33 %
Brady Statistic RV Percent Paced: 99.91 %
Date Time Interrogation Session: 20230417001703
HighPow Impedance: 71 Ohm
Implantable Lead Implant Date: 20171025
Implantable Lead Implant Date: 20171025
Implantable Lead Location: 753859
Implantable Lead Location: 753860
Implantable Lead Model: 5076
Implantable Pulse Generator Implant Date: 20171025
Lead Channel Impedance Value: 361 Ohm
Lead Channel Impedance Value: 361 Ohm
Lead Channel Impedance Value: 418 Ohm
Lead Channel Pacing Threshold Amplitude: 0.5 V
Lead Channel Pacing Threshold Amplitude: 0.625 V
Lead Channel Pacing Threshold Pulse Width: 0.4 ms
Lead Channel Pacing Threshold Pulse Width: 0.4 ms
Lead Channel Sensing Intrinsic Amplitude: 1.75 mV
Lead Channel Sensing Intrinsic Amplitude: 1.75 mV
Lead Channel Sensing Intrinsic Amplitude: 11.25 mV
Lead Channel Sensing Intrinsic Amplitude: 11.25 mV
Lead Channel Setting Pacing Amplitude: 2 V
Lead Channel Setting Pacing Amplitude: 2.5 V
Lead Channel Setting Pacing Pulse Width: 0.4 ms
Lead Channel Setting Sensing Sensitivity: 0.3 mV

## 2021-09-19 MED ORDER — METOPROLOL SUCCINATE ER 25 MG PO TB24: 25.0000 mg | ORAL_TABLET | Freq: Every day | ORAL | 1 refills | Status: DC

## 2021-09-19 NOTE — Telephone Encounter (Signed)
Patient made aware of echo and lab results. ? ?Patient will stop Amlodipine and start Metoprolol succinate 25 mg daily. Rx sent to the patients pharmacy. ? ?-Patient sts that he has not been taking his Atorvastatin. Patient reports muscle pain and weakness with statin. ?-Patient sts that he is not currently established with a nephrologist. He was last seen by nephrology several years ago. ? ?Adv the patient that I will fwd the update to Cadence and call back with her recommendation. ?

## 2021-09-20 NOTE — Telephone Encounter (Signed)
Pending overbook approval  ?

## 2021-09-20 NOTE — Telephone Encounter (Signed)
Fransico Michael, Cadence H, PA-C   ? ?Thank you for the update.  ?Lets bring him in to discuss echo and further medications changes. We want to add on medication to help improve the pump function. Also would like to discuss cholesterol injectable medications.   ? ?

## 2021-09-25 NOTE — Telephone Encounter (Signed)
Scheduled 5/1.

## 2021-09-30 NOTE — Progress Notes (Signed)
?Cardiology Office Note:   ? ?Date:  10/02/2021  ? ?ID:  Roy Palmer, DOB [birth year], MRN [id number] ? ?PCP:  Venita Lick, NP  ?[facility] Cardiologist:  None  ?[place] Electrophysiologist:  None  ? ?Referring MD: Venita Lick, NP  ? ?Chief Complaint: echo follow-up ? ?History of Present Illness:   ? ?Roy Palmer is a 88 y.o. male with a hx of severe MR s/p MV repair 05/2016, VT s/p ICD 03/2016, NICM, chronic HFrEF, nonobstructive CAD by cath in 2017, HTN, CKD stage 3 who presents for 6 month follow-up.  ?  ?Hospitalized in 12/2019 with concern for stroke in the setting of facial droop, though was ultimately diagnosed with Bell's palsy. Echo at that time showed LVEF 45-50%.  ? ?Last seen 09/06/2021 and was overall doing well. Repeat echo was ordered. This showed LVEF 35-40%, global HK, mild LVH, G2DD, severely dilated LA, mild aortic dilation 42mm, moderate MR. Amlodipine was stopped and he was started on Toprol.  ? ?Today, the patient reports he is not tolerating Toprol. Can't eat or sleep. Feels generally tired. He would like to stop it. Dicussed other medications for heart failure, however says he would like to wait on any other medication changes. He is euvolemic on exam.  ? ?Past Medical History:  ?Diagnosis Date  ? AAA (abdominal aortic aneurysm) without rupture ([place]) 03/12/2016  ? Small - measured 3.3 x 3.5 cm by CTA  ? CKD (chronic kidney disease) stage 3, GFR 30-59 ml/min (HCC)   ? Essential hypertension   ? Hyperlipidemia   ? Hypertensive kidney disease   ? Incidental pulmonary nodule 03/12/2016  ? Vague opacity right lung base requires radiographic follow up - index of suspicion is LOW  ? Near syncope   ? Cardiogenic, related to an SVT and severe MR  ? Non-sustained ventricular tachycardia ([place])   ? S/P minimally invasive mitral valve repair 03/19/2016  ? Complex valvuloplasty including triangular resection of flail segment of posterior leaflet, chordal transposition x1, artificial  Gore-tex neochord placement x4 and 34 mm Sorin Memo 3D ring annuloplasty via right mini thoracotomy approach with clipping of LA appendage  ? Severe mitral regurgitation by prior echocardiogram 02/23/2016  ? Confirmed by TEE  ? Syncope 03/15/2016  ? Tennis elbow left  ? Thoracic aortic aneurysm ([place]) 03/12/2016  ? Very very mild fusiform enlargement of distal transverse and proximal descending thoracic aorta noted on CTA  ? Trigeminal neuralgia   ? ? ?Past Surgical History:  ?Procedure Laterality Date  ? CARDIAC CATHETERIZATION N/A 02/23/2016  ? Procedure: Right/Left Heart Cath and Coronary Angiography;  Surgeon: Nelva Bush, MD;  Location: [place] CV LAB;  Service: Cardiovascular: Angiographic minimal coronary disease. Normal left and right heart Pressures. Decreased CO/CI in settng of frequent PVCs & Severe MR.  ? CATARACT EXTRACTION, BILATERAL    ? COLONOSCOPY  08/2005  ? CYST EXCISION  10/2013  ? from neck  ? EP IMPLANTABLE DEVICE N/A 03/27/2016  ? Procedure: ICD Implant Dual Chamber;  Surgeon: Evans Lance, MD;  Location: [place] CV LAB;  Service: Cardiovascular;  Laterality: N/A;  ? ESOPHAGOGASTRODUODENOSCOPY  02/2010  ? MITRAL VALVE REPAIR Right 03/19/2016  ? Procedure: MINIMALLY INVASIVE MITRAL VALVE REPAIR (MVR);  Surgeon: Rexene Alberts, MD;  Location: [place];  Service: Open Heart Surgery;  Laterality: Right;  ? TEE WITHOUT CARDIOVERSION N/A 02/23/2016  ? Procedure: TRANSESOPHAGEAL ECHOCARDIOGRAM (TEE);  Surgeon: Sueanne Margarita, MD;  Location: [place];  Service: Cardiovascular: Normal LV size and function.  Degenerative mitral valve disease with failed posterior leaflet (P2 segment), Severe MR with pulmonary vein systolic flow reversal. Moderate-TR.    ? TEE WITHOUT CARDIOVERSION N/A 03/19/2016  ? Procedure: TRANSESOPHAGEAL ECHOCARDIOGRAM (TEE);  Surgeon: Rexene Alberts, MD;  Location: [place];  Service: Open Heart Surgery;  Laterality: N/A;  ? TRANSTHORACIC ECHOCARDIOGRAM  02/14/2016  ? EF  50-55% with moderate LVH. Possible inferolateral hypokinesis. Moderate-severe MR with posterior leaflet prolapse, cannot rule out flail. Severe LA dilation. Moderate TR.  ? ? ?Current Medications: ?Current Meds  ?Medication Sig  ? acetaminophen (TYLENOL) 325 MG tablet Take 325-650 mg by mouth every 6 (six) hours as needed for mild pain, moderate pain or fever.   ? albuterol (VENTOLIN HFA) 108 (90 Base) MCG/ACT inhaler Inhale 2 puffs into the lungs every 6 (six) hours as needed for wheezing or shortness of breath.  ? amiodarone (PACERONE) 200 MG tablet TAKE 1 TABLET BY MOUTH EVERY DAY MONDAY THROUGH SATURDAY. DO NOT TAKE ON SUNDAY  ? aspirin EC 81 MG tablet Take 1 tablet (81 mg total) by mouth daily.  ? benzonatate (TESSALON) 100 MG capsule Take 1 capsule (100 mg total) by mouth 2 (two) times daily as needed for cough.  ? fexofenadine (ALLEGRA ALLERGY) 180 MG tablet Take 1 tablet (180 mg total) by mouth daily.  ? Multiple Vitamins-Minerals (MULTIVITAMIN ADULTS 50+ PO) Take 1 tablet by mouth daily.  ?  ? ?Allergies:   Amiodarone and Lyrica [pregabalin]  ? ?Social History  ? ?Socioeconomic History  ? Marital status: Married  ?  Spouse name: Not on file  ? Number of children: Not on file  ? Years of education: Not on file  ? Highest education level: Associate degree: academic program  ?Occupational History  ? Occupation: retired  ?Tobacco Use  ? Smoking status: Never  ? Smokeless tobacco: Never  ?Vaping Use  ? Vaping Use: Never used  ?Substance and Sexual Activity  ? Alcohol use: No  ? Drug use: No  ? Sexual activity: Not Currently  ?Other Topics Concern  ? Not on file  ?Social History Narrative  ? Not on file  ? ?Social Determinants of Health  ? ?Financial Resource Strain: Low Risk   ? Difficulty of Paying Living Expenses: Not hard at all  ?Food Insecurity: No Food Insecurity  ? Worried About Charity fundraiser in the Last Year: Never true  ? Ran Out of Food in the Last Year: Never true  ?Transportation Needs: Not on  file  ?Physical Activity: Not on file  ?Stress: No Stress Concern Present  ? Feeling of Stress : Not at all  ?Social Connections: Moderately Integrated  ? Frequency of Communication with Friends and Family: More than three times a week  ? Frequency of Social Gatherings with Friends and Family: More than three times a week  ? Attends Religious Services: More than 4 times per year  ? Active Member of Clubs or Organizations: No  ? Attends Archivist Meetings: Never  ? Marital Status: Married  ?  ? ?Family History: ?The patient's family history includes Dementia in his sister; Heart attack in his father; Heart disease in his father; Hypertension in his mother; Retinoblastoma in his son; Stroke in his mother. There is no history of COPD, Cancer, or Diabetes. ? ?ROS:   ?Please see the history of present illness.    ? All other systems reviewed and are negative. ? ?EKGs/Labs/Other Studies Reviewed:   ? ?  The following studies were reviewed today: ? ?Echo 09/2024 ?1. Left ventricular ejection fraction, by estimation, is 35 to 40%. The  ?left ventricle has moderately decreased function. The left ventricle  ?demonstrates global hypokinesis. There is mild left ventricular  ?hypertrophy. Left ventricular diastolic  ?parameters are consistent with Grade II diastolic dysfunction  ?(pseudonormalization). The average left ventricular global longitudinal  ?strain is -7.3 %. The global longitudinal strain is abnormal.  ? 2. Right ventricular systolic function is normal. The right ventricular  ?size is normal.  ? 3. Left atrial size was severely dilated.  ? 4. The mitral valve has been repaired/replaced. Moderate mitral valve  ?regurgitation. There is a prosthetic annuloplasty ring present in the  ?mitral position.  ? 5. The aortic valve was not well visualized. Aortic valve regurgitation  ?is trivial.  ? 6. Aortic dilatation noted. There is mild dilatation of the aortic root,  ?measuring 40 mm.  ? 7. The inferior vena cava is  normal in size with greater than 50%  ?respiratory variability, suggesting right atrial pressure of 3 mmHg. ? ?Echo 12/2019 ?1. Left ventricular ejection fraction, by estimation, is 45 to 50%. The  ?

## 2021-10-01 ENCOUNTER — Encounter: Payer: Self-pay | Admitting: Medical

## 2021-10-01 ENCOUNTER — Ambulatory Visit (INDEPENDENT_AMBULATORY_CARE_PROVIDER_SITE_OTHER): Payer: Medicare Other | Admitting: Medical

## 2021-10-01 VITALS — BP 120/64 | HR 70 | Ht 71.0 in | Wt 179.0 lb

## 2021-10-01 DIAGNOSIS — I340 Nonrheumatic mitral (valve) insufficiency: Secondary | ICD-10-CM | POA: Diagnosis not present

## 2021-10-01 DIAGNOSIS — Z95810 Presence of automatic (implantable) cardiac defibrillator: Secondary | ICD-10-CM

## 2021-10-01 DIAGNOSIS — I472 Ventricular tachycardia, unspecified: Secondary | ICD-10-CM

## 2021-10-01 DIAGNOSIS — I428 Other cardiomyopathies: Secondary | ICD-10-CM | POA: Diagnosis not present

## 2021-10-01 DIAGNOSIS — I5022 Chronic systolic (congestive) heart failure: Secondary | ICD-10-CM | POA: Diagnosis not present

## 2021-10-01 DIAGNOSIS — I2510 Atherosclerotic heart disease of native coronary artery without angina pectoris: Secondary | ICD-10-CM

## 2021-10-01 DIAGNOSIS — Z9889 Other specified postprocedural states: Secondary | ICD-10-CM

## 2021-10-01 NOTE — Patient Instructions (Signed)
Medication Instructions:  ?No changes at this time.  ? ?*If you need a refill on your cardiac medications before your next appointment, please call your pharmacy* ? ? ?Lab Work: ?None ? ?If you have labs (blood work) drawn today and your tests are completely normal, you will receive your results only by: ?MyChart Message (if you have MyChart) OR ?A paper copy in the mail ?If you have any lab test that is abnormal or we need to change your treatment, we will call you to review the results. ? ? ?Testing/Procedures: ?None ? ? ?Follow-Up: ?At [facility], you and your health needs are our priority.  As part of our continuing mission to provide you with exceptional heart care, we have created designated Provider Care Teams.  These Care Teams include your primary Cardiologist (physician) and Advanced Practice Providers (APPs -  Physician Assistants and Nurse Practitioners) who all work together to provide you with the care you need, when you need it. ? ? ?Your next appointment:   ?3 month(s) ? ?The format for your next appointment:   ?In Person ? ?Provider:   ?[name], MD or Cadence Fransico Michael, PA-C  ? ? ? ? ? ?Important Information About Sugar ? ? ? ? ?  ?

## 2021-10-04 NOTE — Progress Notes (Signed)
Remote ICD transmission.   

## 2021-10-23 ENCOUNTER — Other Ambulatory Visit: Payer: Self-pay | Admitting: Internal Medicine

## 2021-11-07 ENCOUNTER — Other Ambulatory Visit: Payer: Self-pay | Admitting: Internal Medicine

## 2021-11-07 DIAGNOSIS — I428 Other cardiomyopathies: Secondary | ICD-10-CM

## 2021-11-07 DIAGNOSIS — Z95810 Presence of automatic (implantable) cardiac defibrillator: Secondary | ICD-10-CM

## 2021-11-07 DIAGNOSIS — I472 Ventricular tachycardia, unspecified: Secondary | ICD-10-CM

## 2021-11-07 DIAGNOSIS — I5022 Chronic systolic (congestive) heart failure: Secondary | ICD-10-CM

## 2021-11-19 ENCOUNTER — Ambulatory Visit (INDEPENDENT_AMBULATORY_CARE_PROVIDER_SITE_OTHER): Payer: Medicare Other | Admitting: Internal Medicine

## 2021-11-19 ENCOUNTER — Encounter: Payer: Self-pay | Admitting: Internal Medicine

## 2021-11-19 VITALS — BP 112/72 | HR 75 | Ht 71.0 in | Wt 182.0 lb

## 2021-11-19 DIAGNOSIS — I5022 Chronic systolic (congestive) heart failure: Secondary | ICD-10-CM

## 2021-11-19 DIAGNOSIS — I4729 Other ventricular tachycardia: Secondary | ICD-10-CM | POA: Diagnosis not present

## 2021-11-19 DIAGNOSIS — Z95810 Presence of automatic (implantable) cardiac defibrillator: Secondary | ICD-10-CM

## 2021-11-19 LAB — CUP PACEART INCLINIC DEVICE CHECK
Battery Remaining Longevity: 26 mo
Battery Voltage: 2.93 V
Brady Statistic AP VP Percent: 87.98 %
Brady Statistic AP VS Percent: 0.12 %
Brady Statistic AS VP Percent: 11.86 %
Brady Statistic AS VS Percent: 0.05 %
Brady Statistic RA Percent Paced: 87.87 %
Brady Statistic RV Percent Paced: 99.72 %
Date Time Interrogation Session: 20230619164917
HighPow Impedance: 67 Ohm
Implantable Lead Implant Date: 20171025
Implantable Lead Implant Date: 20171025
Implantable Lead Location: 753859
Implantable Lead Location: 753860
Implantable Lead Model: 5076
Implantable Pulse Generator Implant Date: 20171025
Lead Channel Impedance Value: 361 Ohm
Lead Channel Impedance Value: 361 Ohm
Lead Channel Impedance Value: 418 Ohm
Lead Channel Pacing Threshold Amplitude: 0.625 V
Lead Channel Pacing Threshold Amplitude: 0.625 V
Lead Channel Pacing Threshold Pulse Width: 0.4 ms
Lead Channel Pacing Threshold Pulse Width: 0.4 ms
Lead Channel Sensing Intrinsic Amplitude: 1.625 mV
Lead Channel Sensing Intrinsic Amplitude: 1.625 mV
Lead Channel Sensing Intrinsic Amplitude: 11.25 mV
Lead Channel Sensing Intrinsic Amplitude: 11.25 mV
Lead Channel Setting Pacing Amplitude: 2 V
Lead Channel Setting Pacing Amplitude: 2.5 V
Lead Channel Setting Pacing Pulse Width: 0.4 ms
Lead Channel Setting Sensing Sensitivity: 0.3 mV

## 2021-11-19 NOTE — Progress Notes (Signed)
HPI [name] returns today for followup. He has a h/o MR, s/p repair, non-ischemic CM, and CHB. He has VT and we had a difficult time controlling until we got him on amiodarone. He has done well in the interim. A repeat echo at that time demonstrated improved LV function. He has not had any ICD therapies. He is active fishing and hunting. Allergies  Allergen Reactions   Amiodarone Other (See Comments)    Terrible dreams/nightmares in high dosing   Lyrica [Pregabalin] Nausea And Vomiting     Current Outpatient Medications  Medication Sig Dispense Refill   acetaminophen (TYLENOL) 325 MG tablet Take 325-650 mg by mouth every 6 (six) hours as needed for mild pain, moderate pain or fever.      albuterol (VENTOLIN HFA) 108 (90 Base) MCG/ACT inhaler Inhale 2 puffs into the lungs every 6 (six) hours as needed for wheezing or shortness of breath. 6.7 g 0   amiodarone (PACERONE) 200 MG tablet TAKE 1 TABLET BY MOUTH EVERY DAY MONDAY THROUGH SATURDAY. DO NOT TAKE ON SUNDAY 90 tablet 0   aspirin EC 81 MG tablet Take 1 tablet (81 mg total) by mouth daily.     benzonatate (TESSALON) 100 MG capsule Take 1 capsule (100 mg total) by mouth 2 (two) times daily as needed for cough. 20 capsule 0   fexofenadine (ALLEGRA ALLERGY) 180 MG tablet Take 1 tablet (180 mg total) by mouth daily. 10 tablet 1   Multiple Vitamins-Minerals (MULTIVITAMIN ADULTS 50+ PO) Take 1 tablet by mouth daily.     No current facility-administered medications for this visit.     Past Medical History:  Diagnosis Date   AAA (abdominal aortic aneurysm) without rupture (HCC) 03/12/2016   Small - measured 3.3 x 3.5 cm by CTA   CKD (chronic kidney disease) stage 3, GFR 30-59 ml/min (HCC)    Essential hypertension    Hyperlipidemia    Hypertensive kidney disease    Incidental pulmonary nodule 03/12/2016   Vague opacity right lung base requires radiographic follow up - index of suspicion is LOW   Near syncope    Cardiogenic,  related to an SVT and severe MR   Non-sustained ventricular tachycardia (HCC)    S/P minimally invasive mitral valve repair 03/19/2016   Complex valvuloplasty including triangular resection of flail segment of posterior leaflet, chordal transposition x1, artificial Gore-tex neochord placement x4 and 34 mm Sorin Memo 3D ring annuloplasty via right mini thoracotomy approach with clipping of LA appendage   Severe mitral regurgitation by prior echocardiogram 02/23/2016   Confirmed by TEE   Syncope 03/15/2016   Tennis elbow left   Thoracic aortic aneurysm (HCC) 03/12/2016   Very very mild fusiform enlargement of distal transverse and proximal descending thoracic aorta noted on CTA   Trigeminal neuralgia     ROS:   All systems reviewed and negative except as noted in the HPI.   Past Surgical History:  Procedure Laterality Date   CARDIAC CATHETERIZATION N/A 02/23/2016   Procedure: Right/Left Heart Cath and Coronary Angiography;  Surgeon: [name], MD;  Location: [facility] INVASIVE CV LAB;  Service: Cardiovascular: Angiographic minimal coronary disease. Normal left and right heart Pressures. Decreased CO/CI in settng of frequent PVCs & Severe MR.   CATARACT EXTRACTION, BILATERAL     COLONOSCOPY  08/2005   CYST EXCISION  10/2013   from neck   EP IMPLANTABLE DEVICE N/A 03/27/2016   Procedure: ICD Implant Dual Chamber;  Surgeon: Marinus Maw,  MD;  Location: [facility] INVASIVE CV LAB;  Service: Cardiovascular;  Laterality: N/A;   ESOPHAGOGASTRODUODENOSCOPY  02/2010   MITRAL VALVE REPAIR Right 03/19/2016   Procedure: MINIMALLY INVASIVE MITRAL VALVE REPAIR (MVR);  Surgeon: [name], MD;  Location: [facility] OR;  Service: Open Heart Surgery;  Laterality: Right;   TEE WITHOUT CARDIOVERSION N/A 02/23/2016   Procedure: TRANSESOPHAGEAL ECHOCARDIOGRAM (TEE);  Surgeon: Quintella Reichert, MD;  Location: [facility] ENDOSCOPY;  Service: Cardiovascular: Normal LV size and function.  Degenerative mitral valve disease with  failed posterior leaflet (P2 segment), Severe MR with pulmonary vein systolic flow reversal. Moderate-TR.     TEE WITHOUT CARDIOVERSION N/A 03/19/2016   Procedure: TRANSESOPHAGEAL ECHOCARDIOGRAM (TEE);  Surgeon: [name], MD;  Location: [facility] OR;  Service: Open Heart Surgery;  Laterality: N/A;   TRANSTHORACIC ECHOCARDIOGRAM  02/14/2016   EF 50-55% with moderate LVH. Possible inferolateral hypokinesis. Moderate-severe MR with posterior leaflet prolapse, cannot rule out flail. Severe LA dilation. Moderate TR.     Family History  Problem Relation Age of Onset   Hypertension Mother    Stroke Mother    Heart disease Father    Heart attack Father    Dementia Sister    Retinoblastoma Son    COPD Neg Hx    Cancer Neg Hx    Diabetes Neg Hx      Social History   Socioeconomic History   Marital status: Married    Spouse name: Not on file   Number of children: Not on file   Years of education: Not on file   Highest education level: Associate degree: academic program  Occupational History   Occupation: retired  Tobacco Use   Smoking status: Never   Smokeless tobacco: Never  Vaping Use   Vaping Use: Never used  Substance and Sexual Activity   Alcohol use: No   Drug use: No   Sexual activity: Not Currently  Other Topics Concern   Not on file  Social History Narrative   Not on file   Social Determinants of Health   Financial Resource Strain: Low Risk  (07/24/2021)   Overall Financial Resource Strain (CARDIA)    Difficulty of Paying Living Expenses: Not hard at all  Food Insecurity: No Food Insecurity (07/24/2021)   Hunger Vital Sign    Worried About Running Out of Food in the Last Year: Never true    Ran Out of Food in the Last Year: Never true  Transportation Needs: No Transportation Needs (07/17/2020)   PRAPARE - Administrator, Civil Service (Medical): No    Lack of Transportation (Non-Medical): No  Physical Activity: Insufficiently Active (07/17/2020)    Exercise Vital Sign    Days of Exercise per Week: 7 days    Minutes of Exercise per Session: 20 min  Stress: No Stress Concern Present (07/24/2021)   [employer] of Occupational Health - Occupational Stress Questionnaire    Feeling of Stress : Not at all  Social Connections: Moderately Integrated (07/24/2021)   Social Connection and Isolation Panel [NHANES]    Frequency of Communication with Friends and Family: More than three times a week    Frequency of Social Gatherings with Friends and Family: More than three times a week    Attends Religious Services: More than 4 times per year    Active Member of Clubs or Organizations: No    Attends Banker Meetings: Never    Marital Status: Married  Catering manager Violence: Not At Risk (06/11/2017)  Humiliation, Afraid, Rape, and Kick questionnaire    Fear of Current or Ex-Partner: No    Emotionally Abused: No    Physically Abused: No    Sexually Abused: No     BP 112/72   Pulse 75   Ht 5\' 11"  (1.803 m)   Wt 182 lb (82.6 kg)   SpO2 98%   BMI 25.38 kg/m   Physical Exam:  Well appearing NAD HEENT: Unremarkable Neck:  No JVD, no thyromegally Lymphatics:  No adenopathy Back:  No CVA tenderness Lungs:  Clear with no wheezes HEART:  Regular rate rhythm, no murmurs, no rubs, no clicks Abd:  soft, positive bowel sounds, no organomegally, no rebound, no guarding Ext:  2 plus pulses, no edema, no cyanosis, no clubbing Skin:  No rashes no nodules Neuro:  CN II through XII intact, motor grossly intact  DEVICE  Normal device function.  See PaceArt for details.   Assess/Plan:  1. VT - he has had no VT since his last visit on amiodarone. He will continue 200 mg daily, none on Sunday. 2. Chronic systolic heart failure - his symptoms are class 2. He has refused upgrade to a Biv which is reasonable while his CHF is controlled. He is encouraged to maintain a low sodium diet. I would anticipating adding an LV lead when he  reaches ERI in 2.2 years. 3. ICD - his Medtronic DDD ICD is working normally. We will recheck in several months.  4. MR - he has none on exam. He will undergo watchful waiting.    Roy Palmer Roy Skarzynski,MD

## 2021-11-19 NOTE — Patient Instructions (Signed)
Medication Instructions:  ?Your physician recommends that you continue on your current medications as directed. Please refer to the Current Medication list given to you today. ? ?Labwork: ?None ordered. ? ?Testing/Procedures: ?None ordered. ? ?Follow-Up: ?Your physician wants you to follow-up in: one year with Gregg Taylor, MD or one of the following Advanced Practice Providers on your designated Care Team:   ?Renee Ursuy, PA-C ?Michael "Andy" Tillery, PA-C ? ?Remote monitoring is used to monitor your ICD from home. This monitoring reduces the number of office visits required to check your device to one time per year. It allows us to keep an eye on the functioning of your device to ensure it is working properly. You are scheduled for a device check from home on 12/17/2021. You may send your transmission at any time that day. If you have a wireless device, the transmission will be sent automatically. After your physician reviews your transmission, you will receive a postcard with your next transmission date. ? ?Any Other Special Instructions Will Be Listed Below (If Applicable). ? ?If you need a refill on your cardiac medications before your next appointment, please call your pharmacy.  ? ?Important Information About Sugar ? ? ? ? ? ? ? ?

## 2021-12-17 ENCOUNTER — Ambulatory Visit (INDEPENDENT_AMBULATORY_CARE_PROVIDER_SITE_OTHER): Payer: Medicare Other

## 2021-12-17 DIAGNOSIS — I5022 Chronic systolic (congestive) heart failure: Secondary | ICD-10-CM

## 2021-12-17 DIAGNOSIS — I428 Other cardiomyopathies: Secondary | ICD-10-CM

## 2021-12-19 LAB — CUP PACEART REMOTE DEVICE CHECK
Battery Remaining Longevity: 25 mo
Battery Voltage: 2.94 V
Brady Statistic AP VP Percent: 90.84 %
Brady Statistic AP VS Percent: 0.01 %
Brady Statistic AS VP Percent: 9.14 %
Brady Statistic AS VS Percent: 0.01 %
Brady Statistic RA Percent Paced: 90.76 %
Brady Statistic RV Percent Paced: 99.94 %
Date Time Interrogation Session: 20230717043824
HighPow Impedance: 67 Ohm
Implantable Lead Implant Date: 20171025
Implantable Lead Implant Date: 20171025
Implantable Lead Location: 753859
Implantable Lead Location: 753860
Implantable Lead Model: 5076
Implantable Pulse Generator Implant Date: 20171025
Lead Channel Impedance Value: 342 Ohm
Lead Channel Impedance Value: 342 Ohm
Lead Channel Impedance Value: 399 Ohm
Lead Channel Pacing Threshold Amplitude: 0.625 V
Lead Channel Pacing Threshold Amplitude: 0.625 V
Lead Channel Pacing Threshold Pulse Width: 0.4 ms
Lead Channel Pacing Threshold Pulse Width: 0.4 ms
Lead Channel Sensing Intrinsic Amplitude: 1.625 mV
Lead Channel Sensing Intrinsic Amplitude: 1.625 mV
Lead Channel Sensing Intrinsic Amplitude: 11.25 mV
Lead Channel Sensing Intrinsic Amplitude: 11.25 mV
Lead Channel Setting Pacing Amplitude: 2 V
Lead Channel Setting Pacing Amplitude: 2.5 V
Lead Channel Setting Pacing Pulse Width: 0.4 ms
Lead Channel Setting Sensing Sensitivity: 0.3 mV

## 2022-01-02 ENCOUNTER — Ambulatory Visit (INDEPENDENT_AMBULATORY_CARE_PROVIDER_SITE_OTHER): Payer: Medicare Other | Admitting: Medical

## 2022-01-02 ENCOUNTER — Encounter: Payer: Self-pay | Admitting: Medical

## 2022-01-02 VITALS — BP 124/62 | HR 62 | Ht 71.0 in | Wt 182.2 lb

## 2022-01-02 DIAGNOSIS — I472 Ventricular tachycardia, unspecified: Secondary | ICD-10-CM

## 2022-01-02 DIAGNOSIS — I4729 Other ventricular tachycardia: Secondary | ICD-10-CM | POA: Diagnosis not present

## 2022-01-02 DIAGNOSIS — Z79899 Other long term (current) drug therapy: Secondary | ICD-10-CM | POA: Diagnosis not present

## 2022-01-02 DIAGNOSIS — I340 Nonrheumatic mitral (valve) insufficiency: Secondary | ICD-10-CM

## 2022-01-02 DIAGNOSIS — I5022 Chronic systolic (congestive) heart failure: Secondary | ICD-10-CM

## 2022-01-02 DIAGNOSIS — I428 Other cardiomyopathies: Secondary | ICD-10-CM | POA: Diagnosis not present

## 2022-01-02 DIAGNOSIS — E782 Mixed hyperlipidemia: Secondary | ICD-10-CM

## 2022-01-02 MED ORDER — EMPAGLIFLOZIN 10 MG PO TABS: 10.0000 mg | ORAL_TABLET | Freq: Every day | ORAL | 6 refills | Status: DC

## 2022-01-02 NOTE — Progress Notes (Signed)
Cardiology Office Note:    Date:  01/02/2022   ID:  Roy Palmer, DOB [birth year], MRN [id number]  PCP:  Venita Lick, NP  [facility] Cardiologist:  None  [facility] Electrophysiologist:  None   Referring MD: Venita Lick, NP   Chief Complaint: 3 month follow-up  History of Present Illness:    Roy Palmer is a 88 y.o. male with a hx of MR s/p MV repair 05/2016, VT s/p ICD 03/2016, NICM, chronic HFrEF, nonobstructive CAD by cath in 2017, HTN, CKD stage 3 who presents for 6 month follow-up.    Hospitalized in 12/2019 with concern for stroke in the setting of facial droop, though was ultimately diagnosed with Bell's palsy. Echo at that time showed LVEF 45-50%.    Seen 09/06/2021 and was overall doing well. Repeat echo was ordered. This showed LVEF 35-40%, global HK, mild LVH, G2DD, severely dilated LA, mild aortic dilation 45mm, moderate MR. Amlodipine was stopped and he was started on Toprol.   Last seen 10/01/21 and was not tolerating Toprol and this was stopped. He wanted to defer any medication changes at that time.   He saw EP 11/19/21 and discussed adding an LV lead when he reaches ERI in 2.2 years.   Today, the patient says symptoms are better off the Toprol. Patient is willing to try Jardiance today. He denies chest pain and SOB. No LLE, orthopnea, or pnd.   Past Medical History:  Diagnosis Date   AAA (abdominal aortic aneurysm) without rupture ([place]) 03/12/2016   Small - measured 3.3 x 3.5 cm by CTA   CKD (chronic kidney disease) stage 3, GFR 30-59 ml/min (HCC)    Essential hypertension    Hyperlipidemia    Hypertensive kidney disease    Incidental pulmonary nodule 03/12/2016   Vague opacity right lung base requires radiographic follow up - index of suspicion is LOW   Near syncope    Cardiogenic, related to an SVT and severe MR   Non-sustained ventricular tachycardia (HCC)    S/P minimally invasive mitral valve repair 03/19/2016   Complex valvuloplasty  including triangular resection of flail segment of posterior leaflet, chordal transposition x1, artificial Gore-tex neochord placement x4 and 34 mm Sorin Memo 3D ring annuloplasty via right mini thoracotomy approach with clipping of LA appendage   Severe mitral regurgitation by prior echocardiogram 02/23/2016   Confirmed by TEE   Syncope 03/15/2016   Tennis elbow left   Thoracic aortic aneurysm ([place]) 03/12/2016   Very very mild fusiform enlargement of distal transverse and proximal descending thoracic aorta noted on CTA   Trigeminal neuralgia     Past Surgical History:  Procedure Laterality Date   CARDIAC CATHETERIZATION N/A 02/23/2016   Procedure: Right/Left Heart Cath and Coronary Angiography;  Surgeon: Nelva Bush, MD;  Location: [place] CV LAB;  Service: Cardiovascular: Angiographic minimal coronary disease. Normal left and right heart Pressures. Decreased CO/CI in settng of frequent PVCs & Severe MR.   CATARACT EXTRACTION, BILATERAL     COLONOSCOPY  08/2005   CYST EXCISION  10/2013   from neck   EP IMPLANTABLE DEVICE N/A 03/27/2016   Procedure: ICD Implant Dual Chamber;  Surgeon: Evans Lance, MD;  Location: [place] CV LAB;  Service: Cardiovascular;  Laterality: N/A;   ESOPHAGOGASTRODUODENOSCOPY  02/2010   MITRAL VALVE REPAIR Right 03/19/2016   Procedure: MINIMALLY INVASIVE MITRAL VALVE REPAIR (MVR);  Surgeon: Rexene Alberts, MD;  Location: [place];  Service: Open Heart Surgery;  Laterality: Right;   TEE WITHOUT CARDIOVERSION N/A 02/23/2016   Procedure: TRANSESOPHAGEAL ECHOCARDIOGRAM (TEE);  Surgeon: Sueanne Margarita, MD;  Location: [facility] ENDOSCOPY;  Service: Cardiovascular: Normal LV size and function.  Degenerative mitral valve disease with failed posterior leaflet (P2 segment), Severe MR with pulmonary vein systolic flow reversal. Moderate-TR.     TEE WITHOUT CARDIOVERSION N/A 03/19/2016   Procedure: TRANSESOPHAGEAL ECHOCARDIOGRAM (TEE);  Surgeon: Rexene Alberts, MD;   Location: [place];  Service: Open Heart Surgery;  Laterality: N/A;   TRANSTHORACIC ECHOCARDIOGRAM  02/14/2016   EF 50-55% with moderate LVH. Possible inferolateral hypokinesis. Moderate-severe MR with posterior leaflet prolapse, cannot rule out flail. Severe LA dilation. Moderate TR.    Current Medications: Current Meds  Medication Sig   acetaminophen (TYLENOL) 325 MG tablet Take 325-650 mg by mouth every 6 (six) hours as needed for mild pain, moderate pain or fever.    albuterol (VENTOLIN HFA) 108 (90 Base) MCG/ACT inhaler Inhale 2 puffs into the lungs every 6 (six) hours as needed for wheezing or shortness of breath.   amiodarone (PACERONE) 200 MG tablet TAKE 1 TABLET BY MOUTH EVERY DAY MONDAY THROUGH SATURDAY. DO NOT TAKE ON SUNDAY   aspirin EC 81 MG tablet Take 1 tablet (81 mg total) by mouth daily.   empagliflozin (JARDIANCE) 10 MG TABS tablet Take 1 tablet (10 mg total) by mouth daily.   fexofenadine (ALLEGRA ALLERGY) 180 MG tablet Take 1 tablet (180 mg total) by mouth daily.   Multiple Vitamins-Minerals (MULTIVITAMIN ADULTS 50+ PO) Take 1 tablet by mouth daily.     Allergies:   Amiodarone and Lyrica [pregabalin]   Social History   Socioeconomic History   Marital status: Married    Spouse name: Not on file   Number of children: Not on file   Years of education: Not on file   Highest education level: Associate degree: academic program  Occupational History   Occupation: retired  Tobacco Use   Smoking status: Never   Smokeless tobacco: Never  Vaping Use   Vaping Use: Never used  Substance and Sexual Activity   Alcohol use: No   Drug use: No   Sexual activity: Not Currently  Other Topics Concern   Not on file  Social History Narrative   Not on file   Social Determinants of Health   Financial Resource Strain: Low Risk  (07/24/2021)   Overall Financial Resource Strain (CARDIA)    Difficulty of Paying Living Expenses: Not hard at all  Food Insecurity: No Food Insecurity  (07/24/2021)   Hunger Vital Sign    Worried About Running Out of Food in the Last Year: Never true    [place] in the Last Year: Never true  Transportation Needs: No Transportation Needs (07/17/2020)   PRAPARE - Hydrologist (Medical): No    Lack of Transportation (Non-Medical): No  Physical Activity: Insufficiently Active (07/17/2020)   Exercise Vital Sign    Days of Exercise per Week: 7 days    Minutes of Exercise per Session: 20 min  Stress: No Stress Concern Present (07/24/2021)   [place]    Feeling of Stress : Not at all  Social Connections: Moderately Integrated (07/24/2021)   Social Connection and Isolation Panel [NHANES]    Frequency of Communication with Friends and Family: More than three times a week    Frequency of Social Gatherings with Friends and Family: More than three times  a week    Attends Religious Services: More than 4 times per year    Active Member of Clubs or Organizations: No    Attends Banker Meetings: Never    Marital Status: Married     Family History: The patient's family history includes Dementia in his sister; Heart attack in his father; Heart disease in his father; Hypertension in his mother; Retinoblastoma in his son; Stroke in his mother. There is no history of COPD, Cancer, or Diabetes.  ROS:   Please see the history of present illness.     All other systems reviewed and are negative.  EKGs/Labs/Other Studies Reviewed:    The following studies were reviewed today:  Echo 09/2024 1. Left ventricular ejection fraction, by estimation, is 35 to 40%. The  left ventricle has moderately decreased function. The left ventricle  demonstrates global hypokinesis. There is mild left ventricular  hypertrophy. Left ventricular diastolic  parameters are consistent with Grade II diastolic dysfunction  (pseudonormalization). The average left  ventricular global longitudinal  strain is -7.3 %. The global longitudinal strain is abnormal.   2. Right ventricular systolic function is normal. The right ventricular  size is normal.   3. Left atrial size was severely dilated.   4. The mitral valve has been repaired/replaced. Moderate mitral valve  regurgitation. There is a prosthetic annuloplasty ring present in the  mitral position.   5. The aortic valve was not well visualized. Aortic valve regurgitation  is trivial.   6. Aortic dilatation noted. There is mild dilatation of the aortic root,  measuring 40 mm.   7. The inferior vena cava is normal in size with greater than 50%  respiratory variability, suggesting right atrial pressure of 3 mmHg.   Echo 12/2019 1. Left ventricular ejection fraction, by estimation, is 45 to 50%. The  left ventricle has mildly decreased function. The left ventricle  demonstrates global hypokinesis. The left ventricular internal cavity size  was upper normal in size. There is  moderate left ventricular hypertrophy. Left ventricular diastolic  parameters are indeterminate.   2. Right ventricular systolic function is normal. The right ventricular  size is mildly enlarged. Mildly increased right ventricular wall  thickness. There is normal pulmonary artery systolic pressure.   3. The mitral valve has been repaired/replaced. Mild mitral valve  regurgitation. No evidence of mitral stenosis. The mean mitral valve  gradient is 3.0 mmHg.   4. The aortic valve is tricuspid. Aortic valve regurgitation is trivial.  Mild to moderate aortic valve sclerosis/calcification is present, without  any evidence of aortic stenosis.   5. Aortic dilatation noted. There is mild dilatation of the aortic root  measuring 41 mm.   6. The inferior vena cava is normal in size with greater than 50%  respiratory variability, suggesting right atrial pressure of 3 mmHg.    Limited Echo 2018 Study Conclusions   - Procedure  narrative: LIMITED ECHO to assess LVF.  - Left ventricle: The cavity size was moderately dilated. There was    mild concentric hypertrophy. Systolic function was severely    reduced. The estimated ejection fraction was in the range of 25%    to 30%. Probable akinesis of the inferolateral and inferior    myocardium. Features are consistent with a pseudonormal left    ventricular filling pattern, with concomitant abnormal relaxation    and increased filling pressure (grade 2 diastolic dysfunction).  - Mitral valve: Prior procedures included surgical repair.  - Left atrium: The atrium was  mildly dilated.   EKG:  EKG is ordered today.  The ekg ordered today demonstrates AV paced rhythm, 61bpm LBBB, LAD, nonspecific T wave changes  Recent Labs: 09/12/2021: ALT 18; BUN 28; Creatinine, Ser 1.82; Potassium 4.9; Sodium 140  Recent Lipid Panel    Component Value Date/Time   CHOL 192 09/12/2021 1527   CHOL 188 07/29/2019 1328   TRIG 182 (H) 09/12/2021 1527   TRIG 320 (H) 07/29/2019 1328   HDL 42 09/12/2021 1527   CHOLHDL 4.6 09/12/2021 1527   CHOLHDL 5.5 12/22/2019 0536   VLDL 33 12/22/2019 0536   VLDL 64 (H) 07/29/2019 1328   LDLCALC 118 (H) 09/12/2021 1527   LDLDIRECT 125 (H) 09/12/2021 1527    Physical Exam:    VS:  BP 124/62 (BP Location: Left Arm, Patient Position: Sitting, Cuff Size: Normal)   Pulse 62   Ht 5\' 11"  (1.803 m)   Wt 182 lb 3.2 oz (82.6 kg)   SpO2 95%   BMI 25.41 kg/m     Wt Readings from Last 3 Encounters:  01/02/22 182 lb 3.2 oz (82.6 kg)  11/19/21 182 lb (82.6 kg)  10/01/21 179 lb (81.2 kg)     GEN:  Well nourished, well developed in no acute distress HEENT: Normal NECK: No JVD; No carotid bruits LYMPHATICS: No lymphadenopathy CARDIAC: RRR, no murmurs, rubs, gallops RESPIRATORY:  Clear to auscultation without rales, wheezing or rhonchi  ABDOMEN: Soft, non-tender, non-distended MUSCULOSKELETAL:  No edema; No deformity  SKIN: Warm and dry NEUROLOGIC:   Alert and oriented x 3 PSYCHIATRIC:  Normal affect   ASSESSMENT:    1. Chronic HFrEF (heart failure with reduced ejection fraction) (HCC)   2. NSVT (nonsustained ventricular tachycardia) (HCC)   3. Nonischemic cardiomyopathy (HCC)   4. Medication management   5. Nonrheumatic mitral valve regurgitation   6. Hyperlipidemia, mixed   7. Ventricular tachycardia (HCC)    PLAN:    In order of problems listed above:  NICM HFrEF LVEF 35-40% He is euvolemic on exam. Patient did not tolerate Toprol in the past. He has been reluctant to try any other medications, but he will try Jardiance. Start Jardiance 10mg  daily, BMET in 1 week. We will discuss further GDMT at follow-up.  MR s/p MV repair in 2017 Echo 09/2021 showed moderate MR with normal functioning valve.  Nonobstructive CAD Nonobstructive CAD by cath in 2017. He denies anginal symptoms. Continue RF modification with Aspirin. He did not tolerate BB or statin.   HLD LDL 118. Patient did not tolerate Lipitor in the past.   VT s/p ICD Followed by EP. Continue amiodarone  Disposition: Follow up in 2 month(s) with MD/APP    Signed, Marrion Accomando 10/2021, PA-C  01/02/2022 2:21 PM    [facility]

## 2022-01-02 NOTE — Patient Instructions (Addendum)
Medication Instructions:  - Your physician has recommended you make the following change in your medication:   1) START Jardiance 10 mg: - take 1 tablet by mouth once daily   Samples Given: Jardiance 10 mg Lot: [id number] Exp: January 2025 # 4 boxes  *If you need a refill on your cardiac medications before your next appointment, please call your pharmacy*   Lab Work: - Your physician recommends that you return for lab work in: 1 week  BMP  Medical Mall Entrance at [facility] 1st desk on the right to check in (REGISTRATION)  Lab hours: Monday- Friday (7:30 am- 5:30 pm)   If you have labs (blood work) drawn today and your tests are completely normal, you will receive your results only by: MyChart Message (if you have MyChart) OR A paper copy in the mail If you have any lab test that is abnormal or we need to change your treatment, we will call you to review the results.   Testing/Procedures: - none ordered    Follow-Up: At [facility], you and your health needs are our priority.  As part of our continuing mission to provide you with exceptional heart care, we have created designated Provider Care Teams.  These Care Teams include your primary Cardiologist (physician) and Advanced Practice Providers (APPs -  Physician Assistants and Nurse Practitioners) who all work together to provide you with the care you need, when you need it.  We recommend signing up for the patient portal called "MyChart".  Sign up information is provided on this After Visit Summary.  MyChart is used to connect with patients for Virtual Visits (Telemedicine).  Patients are able to view lab/test results, encounter notes, upcoming appointments, etc.  Non-urgent messages can be sent to your provider as well.   To learn more about what you can do with MyChart, go to [email or web address].    Your next appointment:   2 month(s)  The format for your next appointment:   In Person  Provider:   You may see  [name], MD or one of the following Advanced Practice Providers on your designated Care Team:    Cadence Fransico Michael, [place]    Other Instructions Empagliflozin Tablets What is this medication? EMPAGLIFLOZIN (EM pa gli FLOE zin) treats type 2 diabetes. It works by helping your kidneys remove sugar (glucose) from your blood through the urine, which decreases your blood sugar. It can also be used to lower the risk of heart attack, stroke, and hospitalization for heart failure in people with type 2 diabetes. Changes to diet and exercise are often combined with this medication. This medicine may be used for other purposes; ask your health care provider or pharmacist if you have questions. COMMON BRAND NAME(S): Jardiance What should I tell my care team before I take this medication? They need to know if you have any of these conditions: Dehydration Diabetic ketoacidosis Diet low in salt Eating less due to illness, surgery, dieting, or any other reason Frequently drink alcohol Having surgery High cholesterol High levels of potassium in the blood History of pancreatitis or pancreas problems History of yeast infection of the penis or vagina Infections in the bladder, kidneys, or urinary tract Kidney disease Liver disease Low blood pressure On hemodialysis Problems urinating Type 1 diabetes Uncircumcised male An unusual or allergic reaction to empagliflozin, other medications, foods, dyes, or preservatives Pregnant or trying to get pregnant Breast-feeding How should I use this medication? Take this medication by mouth with water. Take it as directed  on the prescription label at the same time every day. You may take it with or without food. Keep taking it unless your care team tells you to stop. A special MedGuide will be given to you by the pharmacist with each prescription and refill. Be sure to read this information carefully each time. Talk to your care team about the use of this  medication in children. Special care may be needed. Overdosage: If you think you have taken too much of this medicine contact a poison control center or emergency room at once. NOTE: This medicine is only for you. Do not share this medicine with others. What if I miss a dose? If you miss a dose, take it as soon as you can. If it is almost time for your next dose, take only that dose. Do not take double or extra doses. What may interact with this medication? Alcohol Diuretics Insulin Lithium This list may not describe all possible interactions. Give your health care provider a list of all the medicines, herbs, non-prescription drugs, or dietary supplements you use. Also tell them if you smoke, drink alcohol, or use illegal drugs. Some items may interact with your medicine. What should I watch for while using this medication? Visit your care team for regular checks on your progress. Tell your care team if your symptoms do not start to get better or if they get worse. This medication can cause a serious condition in which there is too much acid in the blood. If you develop nausea, vomiting, stomach pain, unusual tiredness, or breathing problems, stop taking this medication and call your care team right away. If possible, use a ketone dipstick to check for ketones in your urine. Check with your care team if you have severe diarrhea, nausea, and vomiting, or if you sweat a lot. The loss of too much body fluid may make it dangerous for you to take this medication. A test called the HbA1C (A1C) will be monitored. This is a simple blood test. It measures your blood sugar control over the last 2 to 3 months. You will receive this test every 3 to 6 months. Learn how to check your blood sugar. Learn the symptoms of low and high blood sugar and how to manage them. Always carry a quick-source of sugar with you in case you have symptoms of low blood sugar. Examples include hard sugar candy or glucose tablets.  Make sure others know that you can choke if you eat or drink when you develop serious symptoms of low blood sugar, such as seizures or unconsciousness. Get medical help at once. Tell your care team if you have high blood sugar. You might need to change the dose of your medication. If you are sick or exercising more than usual, you may need to change the dose of your medication. What side effects may I notice from receiving this medication? Side effects that you should report to your care team as soon as possible: Allergic reactions--skin rash, itching, hives, swelling of the face, lips, tongue, or throat Dehydration--increased thirst, dry mouth, feeling faint or lightheaded, headache, dark yellow or brown urine Diabetic ketoacidosis (DKA)--increased thirst or amount of urine, dry mouth, fatigue, fruity odor to breath, trouble breathing, stomach pain, nausea, vomiting Genital yeast infection--redness, swelling, pain, or itchiness, odor, thick or lumpy discharge New pain or tenderness, change in skin color, sores or ulcers, infection of the leg or foot Infection or redness, swelling, tenderness, or pain in the genitals, or area from the  genitals to the back of the rectum Urinary tract infection (UTI)--burning when passing urine, passing frequent small amounts of urine, bloody or cloudy urine, pain in the lower back or sides This list may not describe all possible side effects. Call your doctor for medical advice about side effects. You may report side effects to FDA at 1-800-FDA-1088. Where should I keep my medication? Keep out of the reach of children and pets. Store at room temperature between 20 and 25 degrees C (68 and 77 degrees F). Get rid of any unused medication after the expiration date. To get rid of medications that are no longer needed or have expired: Take the medication to a medication take-back program. Check with your pharmacy or law enforcement to find a location. If you cannot return  the medication, check the label or package insert to see if the medication should be thrown out in the garbage or flushed down the toilet. If you are not sure, ask your care team. If it is safe to put it in the trash, take the medication out of the container. Mix the medication with cat litter, dirt, coffee grounds, or other unwanted substance. Seal the mixture in a bag or container. Put it in the trash. NOTE: This sheet is a summary. It may not cover all possible information. If you have questions about this medicine, talk to your doctor, pharmacist, or health care provider.  2023 Elsevier/Gold Standard (2021-03-22 00:00:00)    Important Information About Sugar

## 2022-01-09 ENCOUNTER — Other Ambulatory Visit
Admission: RE | Admit: 2022-01-09 | Discharge: 2022-01-09 | Disposition: A | Discharge status: Home / Self Care | Payer: Medicare Other | Attending: Medical | Admitting: Medical

## 2022-01-09 DIAGNOSIS — I5022 Chronic systolic (congestive) heart failure: Secondary | ICD-10-CM | POA: Insufficient documentation

## 2022-01-09 DIAGNOSIS — Z79899 Other long term (current) drug therapy: Secondary | ICD-10-CM | POA: Insufficient documentation

## 2022-01-09 LAB — BASIC METABOLIC PANEL
Anion gap: 5 (ref 5–15)
BUN: 30 mg/dL — ABNORMAL HIGH (ref 8–23)
CO2: 22 mmol/L (ref 22–32)
Calcium: 8.5 mg/dL — ABNORMAL LOW (ref 8.9–10.3)
Chloride: 107 mmol/L (ref 98–111)
Creatinine, Ser: 1.97 mg/dL — ABNORMAL HIGH (ref 0.61–1.24)
GFR, Estimated: 32 mL/min — ABNORMAL LOW (ref >60)
Glucose, Bld: 127 mg/dL — ABNORMAL HIGH (ref 70–99)
Potassium: 5.4 mmol/L — ABNORMAL HIGH (ref 3.5–5.1)
Sodium: 134 mmol/L — ABNORMAL LOW (ref 135–145)

## 2022-01-10 ENCOUNTER — Telehealth: Payer: Self-pay | Admitting: Internal Medicine

## 2022-01-10 ENCOUNTER — Telehealth: Payer: Self-pay | Admitting: *Deleted

## 2022-01-10 DIAGNOSIS — E875 Hyperkalemia: Secondary | ICD-10-CM

## 2022-01-10 NOTE — Telephone Encounter (Signed)
Spoke with pt. Pt c/o significant headache and low energy since starting Jardiance.  Advised pt to hold until I discuss with Cadence Fransico Michael, PA-C and will call pt with further recc.  Pt voiced understanding.

## 2022-01-10 NOTE — Telephone Encounter (Signed)
Attempted to call pt. No answer. Lmtcb.  

## 2022-01-10 NOTE — Telephone Encounter (Signed)
-----   Message from Cadence David Stall, PA-C sent at 01/10/2022  3:15 PM EDT ----- Potassium came back high, which is not a common side affect of Jardiance. However Scr.BUN also went up a little. Lets stop Jardiance and check a BMET in a week.

## 2022-01-10 NOTE — Telephone Encounter (Signed)
Pt c/o medication issue:  1. Name of Medication:   empagliflozin (JARDIANCE) 10 MG TABS tablet    2. How are you currently taking this medication (dosage and times per day)? Take 1 tablet (10 mg total) by mouth daily.  3. Are you having a reaction (difficulty breathing--STAT)? No  4. What is your medication issue? Pt states that he cannot take medication. Please advise

## 2022-01-10 NOTE — Telephone Encounter (Signed)
Pt notified of lab results and provider's recc. Pt voiced understanding.  Pt will stop Jardiance and will arrive to medical mall in 1 week for repeat BMET.  Lab orders placed. Jardiance discontinued.   Pt also reports he drinks gatorade every day. Advised pt to stop drinking gatorade as it is high in potassium.  Pt voiced understanding.   No further questions at this time.

## 2022-01-10 NOTE — Telephone Encounter (Signed)
Pt returning call regarding results. Please advise 

## 2022-01-11 NOTE — Telephone Encounter (Signed)
Please see lab result and telephone encounter from yesterday, pt's Jardiance was stopped due to labs.

## 2022-01-16 ENCOUNTER — Other Ambulatory Visit
Admission: RE | Admit: 2022-01-16 | Discharge: 2022-01-16 | Disposition: A | Discharge status: Home / Self Care | Payer: Medicare Other | Attending: Medical | Admitting: Medical

## 2022-01-16 DIAGNOSIS — E875 Hyperkalemia: Secondary | ICD-10-CM | POA: Insufficient documentation

## 2022-01-16 LAB — BASIC METABOLIC PANEL
Anion gap: 7 (ref 5–15)
BUN: 32 mg/dL — ABNORMAL HIGH (ref 8–23)
CO2: 25 mmol/L (ref 22–32)
Calcium: 8.7 mg/dL — ABNORMAL LOW (ref 8.9–10.3)
Chloride: 105 mmol/L (ref 98–111)
Creatinine, Ser: 1.76 mg/dL — ABNORMAL HIGH (ref 0.61–1.24)
GFR, Estimated: 37 mL/min — ABNORMAL LOW (ref >60)
Glucose, Bld: 115 mg/dL — ABNORMAL HIGH (ref 70–99)
Potassium: 4.8 mmol/L (ref 3.5–5.1)
Sodium: 137 mmol/L (ref 135–145)

## 2022-01-17 NOTE — Progress Notes (Signed)
Remote ICD transmission.   

## 2022-01-22 NOTE — Telephone Encounter (Signed)
Returned pt call. Pt states that he heard my voicemail on his answering machine asking for return call.  Notified pt this must have been from last week when I was calling to discuss below message.  Pt voiced understanding and has no further questions or concerns at this time.

## 2022-01-22 NOTE — Telephone Encounter (Signed)
Patient states he is returning another call from [place].

## 2022-02-07 ENCOUNTER — Other Ambulatory Visit: Payer: Self-pay | Admitting: Internal Medicine

## 2022-02-07 DIAGNOSIS — I472 Ventricular tachycardia, unspecified: Secondary | ICD-10-CM

## 2022-02-07 DIAGNOSIS — I5022 Chronic systolic (congestive) heart failure: Secondary | ICD-10-CM

## 2022-02-07 DIAGNOSIS — Z95810 Presence of automatic (implantable) cardiac defibrillator: Secondary | ICD-10-CM

## 2022-02-07 DIAGNOSIS — I428 Other cardiomyopathies: Secondary | ICD-10-CM

## 2022-03-08 ENCOUNTER — Encounter: Payer: Self-pay | Admitting: Medical

## 2022-03-08 ENCOUNTER — Ambulatory Visit: Payer: Medicare Other | Attending: Medical | Admitting: Medical

## 2022-03-08 VITALS — BP 118/68 | HR 67 | Ht 70.0 in | Wt 178.6 lb

## 2022-03-08 DIAGNOSIS — I428 Other cardiomyopathies: Secondary | ICD-10-CM | POA: Diagnosis not present

## 2022-03-08 DIAGNOSIS — I5022 Chronic systolic (congestive) heart failure: Secondary | ICD-10-CM

## 2022-03-08 DIAGNOSIS — I340 Nonrheumatic mitral (valve) insufficiency: Secondary | ICD-10-CM | POA: Diagnosis not present

## 2022-03-08 DIAGNOSIS — E782 Mixed hyperlipidemia: Secondary | ICD-10-CM

## 2022-03-08 DIAGNOSIS — Z95810 Presence of automatic (implantable) cardiac defibrillator: Secondary | ICD-10-CM | POA: Diagnosis not present

## 2022-03-08 DIAGNOSIS — I472 Ventricular tachycardia, unspecified: Secondary | ICD-10-CM | POA: Diagnosis not present

## 2022-03-08 DIAGNOSIS — Z9889 Other specified postprocedural states: Secondary | ICD-10-CM

## 2022-03-08 MED ORDER — AMIODARONE HCL 200 MG PO TABS: 200.0000 mg | ORAL_TABLET | ORAL | 3 refills | Status: DC

## 2022-03-08 MED ORDER — SPIRONOLACTONE 25 MG PO TABS: 12.5000 mg | ORAL_TABLET | Freq: Every day | ORAL | 3 refills | Status: DC

## 2022-03-08 NOTE — Progress Notes (Signed)
Cardiology Office Note:    Date:  03/10/2022   ID:  [name], DOB 18-Mar-1930, MRN [id number]  PCP:  Venita Lick, NP  [facility] Cardiologist:  None  [facility] Electrophysiologist:  None   Referring MD: Venita Lick, NP   Chief Complaint: 2 month follow-up  History of Present Illness:    Roy Palmer is a 88 y.o. male with a hx of MR s/p MV repair 05/2016, VT s/p ICD 03/2016, NICM, chronic HFrEF, nonobstructive CAD by cath in 2017, HTN, CKD stage 3 who presents for 6 month follow-up.    Hospitalized in 12/2019 with concern for stroke in the setting of facial droop, though was ultimately diagnosed with Bell's palsy. Echo at that time showed LVEF 45-50%.    Seen 09/06/2021 and was overall doing well. Repeat echo was ordered. This showed LVEF 35-40%, global HK, mild LVH, G2DD, severely dilated LA, mild aortic dilation 20mm, moderate MR. Amlodipine was stopped and he was started on Toprol.    Last seen 10/01/21 and was not tolerating Toprol and this was stopped. He wanted to defer any medication changes at that time.    He saw EP 11/19/21 and discussed adding an LV lead when he reaches ERI in 2.2 years.   Last seen 01/2022 and was doing well. Vania Rea was started.   Today, the patient reports he is doing well. Jardiance stopped due to worsening kidney function, it also gave him a headache. He denies chest pain, SOB, LLE, orthopnea, pnd. Patient is willing to try spironolactone.   Past Medical History:  Diagnosis Date   AAA (abdominal aortic aneurysm) without rupture ([place]) 03/12/2016   Small - measured 3.3 x 3.5 cm by CTA   CKD (chronic kidney disease) stage 3, GFR 30-59 ml/min (HCC)    Essential hypertension    Hyperlipidemia    Hypertensive kidney disease    Incidental pulmonary nodule 03/12/2016   Vague opacity right lung base requires radiographic follow up - index of suspicion is LOW   Near syncope    Cardiogenic, related to an SVT and severe MR    Non-sustained ventricular tachycardia (HCC)    S/P minimally invasive mitral valve repair 03/19/2016   Complex valvuloplasty including triangular resection of flail segment of posterior leaflet, chordal transposition x1, artificial Gore-tex neochord placement x4 and 34 mm Sorin Memo 3D ring annuloplasty via right mini thoracotomy approach with clipping of LA appendage   Severe mitral regurgitation by prior echocardiogram 02/23/2016   Confirmed by TEE   Syncope 03/15/2016   Tennis elbow left   Thoracic aortic aneurysm ([place]) 03/12/2016   Very very mild fusiform enlargement of distal transverse and proximal descending thoracic aorta noted on CTA   Trigeminal neuralgia     Past Surgical History:  Procedure Laterality Date   CARDIAC CATHETERIZATION N/A 02/23/2016   Procedure: Right/Left Heart Cath and Coronary Angiography;  Surgeon: Nelva Bush, MD;  Location: [facility];  Service: Cardiovascular: Angiographic minimal coronary disease. Normal left and right heart Pressures. Decreased CO/CI in settng of frequent PVCs & Severe MR.   CATARACT EXTRACTION, BILATERAL     COLONOSCOPY  08/2005   CYST EXCISION  10/2013   from neck   EP IMPLANTABLE DEVICE N/A 03/27/2016   Procedure: ICD Implant Dual Chamber;  Surgeon: Evans Lance, MD;  Location: [facility];  Service: Cardiovascular;  Laterality: N/A;   ESOPHAGOGASTRODUODENOSCOPY  02/2010   MITRAL VALVE REPAIR Right 03/19/2016   Procedure: MINIMALLY  INVASIVE MITRAL VALVE REPAIR (MVR);  Surgeon: Rexene Alberts, MD;  Location: [place] [facility];  Service: Open Heart Surgery;  Laterality: Right;   TEE WITHOUT CARDIOVERSION N/A 02/23/2016   Procedure: TRANSESOPHAGEAL ECHOCARDIOGRAM (TEE);  Surgeon: Sueanne Margarita, MD;  Location: [facility];  Service: Cardiovascular: Normal LV size and function.  Degenerative mitral valve disease with failed posterior leaflet (P2 segment), Severe MR with pulmonary vein systolic flow reversal. Moderate-TR.     TEE  WITHOUT CARDIOVERSION N/A 03/19/2016   Procedure: TRANSESOPHAGEAL ECHOCARDIOGRAM (TEE);  Surgeon: Rexene Alberts, MD;  Location: [place];  Service: Open Heart Surgery;  Laterality: N/A;   TRANSTHORACIC ECHOCARDIOGRAM  02/14/2016   EF 50-55% with moderate LVH. Possible inferolateral hypokinesis. Moderate-severe MR with posterior leaflet prolapse, cannot rule out flail. Severe LA dilation. Moderate TR.    Current Medications: Current Meds  Medication Sig   acetaminophen (TYLENOL) 325 MG tablet Take 325-650 mg by mouth every 6 (six) hours as needed for mild pain, moderate pain or fever.    albuterol (VENTOLIN HFA) 108 (90 Base) MCG/ACT inhaler Inhale 2 puffs into the lungs every 6 (six) hours as needed for wheezing or shortness of breath.   aspirin EC 81 MG tablet Take 1 tablet (81 mg total) by mouth daily.   benzonatate (TESSALON) 100 MG capsule Take 1 capsule (100 mg total) by mouth 2 (two) times daily as needed for cough.   fexofenadine (ALLEGRA ALLERGY) 180 MG tablet Take 1 tablet (180 mg total) by mouth daily.   Multiple Vitamins-Minerals (MULTIVITAMIN ADULTS 50+ PO) Take 1 tablet by mouth daily.   spironolactone (ALDACTONE) 25 MG tablet Take 0.5 tablets (12.5 mg total) by mouth daily.   [DISCONTINUED] amiodarone (PACERONE) 200 MG tablet TAKE 1 TABLET BY MOUTH EVERY DAY MONDAY THROUGH SATURDAY. DO NOT TAKE ON SUNDAY (Patient taking differently: Take 200 mg by mouth See admin instructions. TAKE 1 TABLET BY MOUTH EVERY DAY MONDAY THROUGH SATURDAY. DO NOT TAKE ON SUNDAY)     Allergies:   Amiodarone and Lyrica [pregabalin]   Social History   Socioeconomic History   Marital status: Married    Spouse name: Not on file   Number of children: Not on file   Years of education: Not on file   Highest education level: Associate degree: academic program  Occupational History   Occupation: retired  Tobacco Use   Smoking status: Never   Smokeless tobacco: Never  Vaping Use   Vaping Use: Never  used  Substance and Sexual Activity   Alcohol use: No   Drug use: No   Sexual activity: Not Currently  Other Topics Concern   Not on file  Social History Narrative   Not on file   Social Determinants of Health   Financial Resource Strain: Low Risk  (07/24/2021)   Overall Financial Resource Strain (CARDIA)    Difficulty of Paying Living Expenses: Not hard at all  Food Insecurity: No Food Insecurity (07/24/2021)   Hunger Vital Sign    Worried About Running Out of Food in the Last Year: Never true    [place] in the Last Year: Never true  Transportation Needs: No Transportation Needs (07/17/2020)   PRAPARE - Hydrologist (Medical): No    Lack of Transportation (Non-Medical): No  Physical Activity: Insufficiently Active (07/17/2020)   Exercise Vital Sign    Days of Exercise per Week: 7 days    Minutes of Exercise per Session: 20 min  Stress: No  Stress Concern Present (07/24/2021)   [place]    Feeling of Stress : Not at all  Social Connections: Moderately Integrated (07/24/2021)   Social Connection and Isolation Panel [NHANES]    Frequency of Communication with Friends and Family: More than three times a week    Frequency of Social Gatherings with Friends and Family: More than three times a week    Attends Religious Services: More than 4 times per year    Active Member of [employer] or Organizations: No    Attends Archivist Meetings: Never    Marital Status: Married     Family History: The patient's family history includes Dementia in his sister; Heart attack in his father; Heart disease in his father; Hypertension in his mother; Retinoblastoma in his son; Stroke in his mother. There is no history of COPD, Cancer, or Diabetes.  ROS:   Please see the history of present illness.     All other systems reviewed and are negative.  EKGs/Labs/Other Studies Reviewed:    The  following studies were reviewed today:   Echo 09/2021  1. Left ventricular ejection fraction, by estimation, is 35 to 40%. The  left ventricle has moderately decreased function. The left ventricle  demonstrates global hypokinesis. There is mild left ventricular  hypertrophy. Left ventricular diastolic  parameters are consistent with Grade II diastolic dysfunction  (pseudonormalization). The average left ventricular global longitudinal  strain is -7.3 %. The global longitudinal strain is abnormal.   2. Right ventricular systolic function is normal. The right ventricular  size is normal.   3. Left atrial size was severely dilated.   4. The mitral valve has been repaired/replaced. Moderate mitral valve  regurgitation. There is a prosthetic annuloplasty ring present in the  mitral position.   5. The aortic valve was not well visualized. Aortic valve regurgitation  is trivial.   6. Aortic dilatation noted. There is mild dilatation of the aortic root,  measuring 40 mm.   7. The inferior vena cava is normal in size with greater than 50%  respiratory variability, suggesting right atrial pressure of 3 mmHg.   Echo 12/2019 1. Left ventricular ejection fraction, by estimation, is 45 to 50%. The  left ventricle has mildly decreased function. The left ventricle  demonstrates global hypokinesis. The left ventricular internal cavity size  was upper normal in size. There is  moderate left ventricular hypertrophy. Left ventricular diastolic  parameters are indeterminate.   2. Right ventricular systolic function is normal. The right ventricular  size is mildly enlarged. Mildly increased right ventricular wall  thickness. There is normal pulmonary artery systolic pressure.   3. The mitral valve has been repaired/replaced. Mild mitral valve  regurgitation. No evidence of mitral stenosis. The mean mitral valve  gradient is 3.0 mmHg.   4. The aortic valve is tricuspid. Aortic valve regurgitation is  trivial.  Mild to moderate aortic valve sclerosis/calcification is present, without  any evidence of aortic stenosis.   5. Aortic dilatation noted. There is mild dilatation of the aortic root  measuring 41 mm.   6. The inferior vena cava is normal in size with greater than 50%  respiratory variability, suggesting right atrial pressure of 3 mmHg.    Limited Echo 2018 Study Conclusions   - Procedure narrative: LIMITED ECHO to assess LVF.  - Left ventricle: The cavity size was moderately dilated. There was    mild concentric hypertrophy. Systolic function was severely  reduced. The estimated ejection fraction was in the range of 25%    to 30%. Probable akinesis of the inferolateral and inferior    myocardium. Features are consistent with a pseudonormal left    ventricular filling pattern, with concomitant abnormal relaxation    and increased filling pressure (grade 2 diastolic dysfunction).  - Mitral valve: Prior procedures included surgical repair.  - Left atrium: The atrium was mildly dilated.   EKG:  EKG is ordered today.  The ekg ordered today demonstrates AV dual paced rhythm, 67bpm  Recent Labs: 09/12/2021: ALT 18 01/16/2022: BUN 32; Creatinine, Ser 1.76; Potassium 4.8; Sodium 137  Recent Lipid Panel    Component Value Date/Time   CHOL 192 09/12/2021 1527   CHOL 188 07/29/2019 1328   TRIG 182 (H) 09/12/2021 1527   TRIG 320 (H) 07/29/2019 1328   HDL 42 09/12/2021 1527   CHOLHDL 4.6 09/12/2021 1527   CHOLHDL 5.5 12/22/2019 0536   VLDL 33 12/22/2019 0536   VLDL 64 (H) 07/29/2019 1328   LDLCALC 118 (H) 09/12/2021 1527   LDLDIRECT 125 (H) 09/12/2021 1527     Physical Exam:    VS:  BP 118/68 (BP Location: Left Arm)   Pulse 67   Ht 5\' 10"  (1.778 m)   Wt 178 lb 9.6 oz (81 kg)   SpO2 94%   BMI 25.63 kg/m     Wt Readings from Last 3 Encounters:  03/08/22 178 lb 9.6 oz (81 kg)  01/02/22 182 lb 3.2 oz (82.6 kg)  11/19/21 182 lb (82.6 kg)     GEN:  Well nourished,  well developed in no acute distress HEENT: Normal NECK: No JVD; No carotid bruits LYMPHATICS: No lymphadenopathy CARDIAC: RRR, no murmurs, rubs, gallops RESPIRATORY:  Clear to auscultation without rales, wheezing or rhonchi  ABDOMEN: Soft, non-tender, non-distended MUSCULOSKELETAL:  No edema; No deformity  SKIN: Warm and dry NEUROLOGIC:  Alert and oriented x 3 PSYCHIATRIC:  Normal affect   ASSESSMENT:    1. Nonischemic cardiomyopathy ([place])   2. Chronic systolic heart failure ([place])   3. ICD (implantable cardioverter-defibrillator) in place   4. Nonrheumatic mitral valve regurgitation   5. S/P minimally invasive mitral valve repair   6. Hyperlipidemia, mixed   7. Ventricular tachycardia (HCC)    PLAN:    In order of problems listed above:  NICM HFrEF LVEF 35-40% Patient is euvolemic on exam. He did not tolerate Toprol or [place]. I will start Spironolactone 12.5mg  daily, BMET in 2 weeks. Can try Entresto at follow-up. He will eventually need repeat limited echo.   MR s/p MRV in 2017 Echo 09/2021 showed moderate MR with normal functioning valve.  Nonobstructive CAD Nonobstructive CAD by cath in 2017. He denies anginal symptoms. Continue risk factor modification with Aspirin. He did not tolerate BB or statin.   HLD LDL 118. Patient did not tolerate Lipitor in the past.   VT s/p ICD Followed by EP, continue amiodarone Monday to Saturday, we will refill this.   Disposition: Follow up in 2 month(s) with MD/APP     Signed, Antwane Grose Ninfa Meeker, PA-C  03/10/2022 8:34 PM    [facility]

## 2022-03-08 NOTE — Patient Instructions (Signed)
Medication Instructions:  Your physician has recommended you make the following change in your medication:   START Spironolactone 12.5 mg once daily   *If you need a refill on your cardiac medications before your next appointment, please call your pharmacy*   Lab Work: BMET in 2 weeks. No appointment is needed.  Medical Mall Entrance at [facility] 1st desk on the right to check in (REGISTRATION)  Lab hours: Monday- Friday (7:30 am- 5:30 pm)  If you have labs (blood work) drawn today and your tests are completely normal, you will receive your results only by: [place] (if you have MyChart) OR A paper copy in the mail If you have any lab test that is abnormal or we need to change your treatment, we will call you to review the results.   Testing/Procedures: None   Follow-Up: At [facility], you and your health needs are our priority.  As part of our continuing mission to provide you with exceptional heart care, we have created designated Provider Care Teams.  These Care Teams include your primary Cardiologist (physician) and Advanced Practice Providers (APPs -  Physician Assistants and Nurse Practitioners) who all work together to provide you with the care you need, when you need it.   Your next appointment:   1 month(s)  The format for your next appointment:   In Person  Provider:   Nelva Bush, MD or Cadence Kathlen Mody, [place]       Important Information About Sugar

## 2022-03-14 ENCOUNTER — Telehealth: Payer: Self-pay | Admitting: Internal Medicine

## 2022-03-14 NOTE — Telephone Encounter (Signed)
Pt c/o medication issue:  1. Name of Medication:  spironolactone (ALDACTONE) 25 MG tablet  2. How are you currently taking this medication (dosage and times per day)?   3. Are you having a reaction (difficulty breathing--STAT)?   4. What is your medication issue?    Patient states since going on this medication the patient he has been having low grade headaches and dizziness daily.  Marland KitchenSTAT if patient feels like he/she is going to faint   Are you dizzy now?  No, patient states dizziness normally goes away around lunch.   Do you feel faint or have you passed out?  No   Do you have any other symptoms?    Have you checked your HR and BP (record if available)?  Patient states BP is fine, around 118/68 Patient hasn't checked HR

## 2022-03-14 NOTE — Telephone Encounter (Signed)
Spoke with patient and he reports since starting that new medication (Spirolactone) he has been having problems with low grade headache and dizziness. He reports having no more than a dozen headaches in his lifetime but since starting that medication he has had one. He then went on to say that "I don't take to medications well" and that he just does not think he can take this one. Advised that I would send this over to provider for her review and further recommendations.

## 2022-03-18 ENCOUNTER — Ambulatory Visit (INDEPENDENT_AMBULATORY_CARE_PROVIDER_SITE_OTHER): Payer: Medicare Other

## 2022-03-18 DIAGNOSIS — I5022 Chronic systolic (congestive) heart failure: Secondary | ICD-10-CM

## 2022-03-18 DIAGNOSIS — I428 Other cardiomyopathies: Secondary | ICD-10-CM

## 2022-03-18 NOTE — Telephone Encounter (Signed)
Reviewed provider response and confirmed upcoming appointment. He verbalized understanding with no further questions at this time.      Antony Madura, PA-C  Sent: Mon March 18, 2022 10:58 AM   Message  Of course he didn't. Ok that's ok. We can discuss at follow-up. Thanks

## 2022-03-19 LAB — CUP PACEART REMOTE DEVICE CHECK
Battery Remaining Longevity: 26 mo
Battery Voltage: 2.93 V
Brady Statistic AP VP Percent: 89.03 %
Brady Statistic AP VS Percent: 0.01 %
Brady Statistic AS VP Percent: 10.95 %
Brady Statistic AS VS Percent: 0.01 %
Brady Statistic RA Percent Paced: 88.98 %
Brady Statistic RV Percent Paced: 99.94 %
Date Time Interrogation Session: 20231016001705
HighPow Impedance: 76 Ohm
Implantable Lead Implant Date: 20171025
Implantable Lead Implant Date: 20171025
Implantable Lead Location: 753859
Implantable Lead Location: 753860
Implantable Lead Model: 5076
Implantable Pulse Generator Implant Date: 20171025
Lead Channel Impedance Value: 361 Ohm
Lead Channel Impedance Value: 361 Ohm
Lead Channel Impedance Value: 418 Ohm
Lead Channel Pacing Threshold Amplitude: 0.625 V
Lead Channel Pacing Threshold Amplitude: 0.75 V
Lead Channel Pacing Threshold Pulse Width: 0.4 ms
Lead Channel Pacing Threshold Pulse Width: 0.4 ms
Lead Channel Sensing Intrinsic Amplitude: 1.625 mV
Lead Channel Sensing Intrinsic Amplitude: 1.625 mV
Lead Channel Sensing Intrinsic Amplitude: 11.25 mV
Lead Channel Sensing Intrinsic Amplitude: 11.25 mV
Lead Channel Setting Pacing Amplitude: 2 V
Lead Channel Setting Pacing Amplitude: 2.5 V
Lead Channel Setting Pacing Pulse Width: 0.4 ms
Lead Channel Setting Sensing Sensitivity: 0.3 mV

## 2022-04-10 ENCOUNTER — Ambulatory Visit: Payer: Medicare Other | Admitting: Internal Medicine

## 2022-04-11 NOTE — Progress Notes (Signed)
Remote ICD transmission.   

## 2022-04-16 ENCOUNTER — Ambulatory Visit: Payer: Medicare Other | Admitting: Medical

## 2022-05-09 ENCOUNTER — Encounter: Payer: Self-pay | Admitting: Physician Assistant

## 2022-05-09 ENCOUNTER — Ambulatory Visit: Payer: Medicare Other | Admitting: Physician Assistant

## 2022-05-09 VITALS — BP 117/62 | HR 65 | Temp 97.6°F | Wt 178.1 lb

## 2022-05-09 DIAGNOSIS — H6121 Impacted cerumen, right ear: Secondary | ICD-10-CM | POA: Diagnosis not present

## 2022-05-09 NOTE — Patient Instructions (Signed)
If you need to keep your ears clean you can do the following as long as you are not having any pain or hearing loss  Use Debrox ear wax softener once per day for a week- this helps the wax to soften and drain out of the ear  Clean your ears with a mix of warm water and hydrogen peroxide - let it sit in the ear for about a minute then drain it and wipe away excess with a wash cloth

## 2022-05-09 NOTE — Progress Notes (Signed)
Acute Office Visit   Patient: Roy Palmer   DOB: [birth year]   88 y.o. Male  MRN: [id number] Visit Date: 05/09/2022  Today's healthcare provider: Oswaldo Conroy Ved Martos, PA-C  Introduced myself to the patient as a Secondary school teacher and provided education on APPs in clinical practice.    Chief Complaint  Patient presents with   Ear Fullness    Pt states he has been having issues with his R ear for the last 2 years. States he had Bell's Palsy and feels like a fullness or fluid in his ear when he lays down    Subjective    Ear Fullness  Associated symptoms include hearing loss.   HPI     Ear Fullness    Additional comments: Pt states he has been having issues with his R ear for the last 2 years. States he had Bell's Palsy and feels like a fullness or fluid in his ear when he lays down       Last edited by Pablo Ledger, CMA on 05/09/2022  1:25 PM.      Reports he had Bell's Palsy two years ago on the right side and thinks this is still causing some problems Reports his right ear feels like it is under pressure and clogged   Medications: Outpatient Medications Prior to Visit  Medication Sig   acetaminophen (TYLENOL) 325 MG tablet Take 325-650 mg by mouth every 6 (six) hours as needed for mild pain, moderate pain or fever.    amiodarone (PACERONE) 200 MG tablet Take 1 tablet (200 mg total) by mouth See admin instructions. TAKE 1 TABLET BY MOUTH EVERY DAY MONDAY THROUGH SATURDAY. DO NOT TAKE ON SUNDAY   Multiple Vitamins-Minerals (MULTIVITAMIN ADULTS 50+ PO) Take 1 tablet by mouth daily.   spironolactone (ALDACTONE) 25 MG tablet Take 0.5 tablets (12.5 mg total) by mouth daily. (Patient not taking: Reported on 05/09/2022)   [DISCONTINUED] albuterol (VENTOLIN HFA) 108 (90 Base) MCG/ACT inhaler Inhale 2 puffs into the lungs every 6 (six) hours as needed for wheezing or shortness of breath.   [DISCONTINUED] aspirin EC 81 MG tablet Take 1 tablet (81 mg total) by mouth daily.    [DISCONTINUED] benzonatate (TESSALON) 100 MG capsule Take 1 capsule (100 mg total) by mouth 2 (two) times daily as needed for cough.   [DISCONTINUED] fexofenadine (ALLEGRA ALLERGY) 180 MG tablet Take 1 tablet (180 mg total) by mouth daily.   No facility-administered medications prior to visit.    Review of Systems  HENT:  Positive for hearing loss.        Right ear has sensation of fullness         Objective    BP 117/62   Pulse 65   Temp 97.6 F (36.4 C) (Oral)   Wt 178 lb 1.6 oz (80.8 kg)   SpO2 99%   BMI 25.55 kg/m    Physical Exam Vitals reviewed.  Constitutional:      General: He is awake.     Appearance: Normal appearance. He is well-developed and well-groomed.  HENT:     Head: Normocephalic and atraumatic.     Right Ear: There is impacted cerumen.     Left Ear: Hearing, tympanic membrane, ear canal and external ear normal.     Ears:     Comments: Rechecked right ear after ear lavage- TM normal, canal normal  Musculoskeletal:     Cervical back: Normal range of motion.  Neurological:  Mental Status: He is alert.  Psychiatric:        Behavior: Behavior is cooperative.       No results found for any visits on 05/09/22.  Assessment & Plan      Problem List Items Addressed This Visit   None Visit Diagnoses     Impacted cerumen of right ear    -  Primary Acute, new concern Reported sensation of ear fullness and decreased hearing in right ear  Right ear had impacted cerumen which was removed with lavage and TM, canal found to be normal to visual inspection Provided instructions to use Debrox and ear washes at home to assist with prevention Follow up as needed for recurrent symptoms   Relevant Orders   Ear Lavage        No follow-ups on file.   I, Killian Ress E Demontae Antunes, PA-C, have reviewed all documentation for this visit. The documentation on 05/09/22 for the exam, diagnosis, procedures, and orders are all accurate and complete.   Talitha Givens, MHS,  PA-C [facility]

## 2022-05-27 NOTE — Patient Instructions (Signed)
Heart Failure Action Plan A heart failure action plan helps you understand what to do when you have symptoms of heart failure. Your action plan is a color-coded plan that lists the symptoms to watch for and indicates what actions to take. If you have symptoms in the red zone, you need medical care right away. If you have symptoms in the yellow zone, you are having problems. If you have symptoms in the green zone, you are doing well. Follow the plan that was created by you and your health care provider. Review your plan each time you visit your health care provider. Red zone These signs and symptoms mean you should get medical help right away: You have trouble breathing when resting. You have a dry cough that is getting worse. You have swelling or pain in your legs or abdomen that is getting worse. You suddenly gain more than 2-3 lb (0.9-1.4 kg) in 24 hours, or more than 5 lb (2.3 kg) in a week. This amount may be more or less depending on your condition. You have trouble staying awake or you feel confused. You have chest pain. You do not have an appetite. You pass out. You have worsening sadness or depression. If you have any of these symptoms, call your local emergency services (911 in the [place] right away. Do not drive yourself to the hospital. Yellow zone These signs and symptoms mean your condition may be getting worse and you should make some changes: You have trouble breathing when you are active, or you need to sleep with your head raised on extra pillows to help you breathe. You have swelling in your legs or abdomen. You gain 2-3 lb (0.9-1.4 kg) in 24 hours, or 5 lb (2.3 kg) in a week. This amount may be more or less depending on your condition. You get tired easily. You have trouble sleeping. You have a dry cough. If you have any of these symptoms: Contact your health care provider within the next day. Your health care provider may adjust your medicines. Green zone These signs  mean you are doing well and can continue what you are doing: You do not have shortness of breath. You have very little swelling or no new swelling. Your weight is stable (no gain or loss). You have a normal activity level. You do not have chest pain or any other new symptoms. Follow these instructions at home: Take over-the-counter and prescription medicines only as told by your health care provider. Weigh yourself daily. Your target weight is __________ lb (__________ kg). Call your health care provider if you gain more than __________ lb (__________ kg) in 24 hours, or more than __________ lb (__________ kg) in a week. Health care provider name: _____________________________________________________ Health care provider phone number: _____________________________________________________ Eat a heart-healthy diet. Work with a diet and nutrition specialist (dietitian) to create an eating plan that is best for you. Keep all follow-up visits. This is important. Where to find more information American Heart Association: Summary A heart failure action plan helps you understand what to do when you have symptoms of heart failure. Follow the action plan that was created by you and your health care provider. Get help right away if you have any symptoms in the red zone. This information is not intended to replace advice given to you by your health care provider. Make sure you discuss any questions you have with your health care provider. Document Revised: 08/28/2021 Document Reviewed: 01/03/2020 Elsevier Patient Education  2023 Elsevier Inc.  

## 2022-05-29 ENCOUNTER — Encounter: Payer: Self-pay | Admitting: Nurse Practitioner

## 2022-05-29 ENCOUNTER — Ambulatory Visit: Payer: Medicare Other | Admitting: Nurse Practitioner

## 2022-05-29 VITALS — BP 110/69 | HR 66 | Temp 97.7°F | Ht 70.0 in | Wt 181.2 lb

## 2022-05-29 DIAGNOSIS — I340 Nonrheumatic mitral (valve) insufficiency: Secondary | ICD-10-CM

## 2022-05-29 DIAGNOSIS — N183 Chronic kidney disease, stage 3 unspecified: Secondary | ICD-10-CM

## 2022-05-29 DIAGNOSIS — N1832 Chronic kidney disease, stage 3b: Secondary | ICD-10-CM | POA: Diagnosis not present

## 2022-05-29 DIAGNOSIS — R7301 Impaired fasting glucose: Secondary | ICD-10-CM

## 2022-05-29 DIAGNOSIS — I5022 Chronic systolic (congestive) heart failure: Secondary | ICD-10-CM

## 2022-05-29 DIAGNOSIS — I428 Other cardiomyopathies: Secondary | ICD-10-CM

## 2022-05-29 DIAGNOSIS — Z95810 Presence of automatic (implantable) cardiac defibrillator: Secondary | ICD-10-CM

## 2022-05-29 DIAGNOSIS — I7123 Aneurysm of the descending thoracic aorta, without rupture: Secondary | ICD-10-CM

## 2022-05-29 DIAGNOSIS — I130 Hypertensive heart and chronic kidney disease with heart failure and stage 1 through stage 4 chronic kidney disease, or unspecified chronic kidney disease: Secondary | ICD-10-CM | POA: Diagnosis not present

## 2022-05-29 DIAGNOSIS — Z23 Encounter for immunization: Secondary | ICD-10-CM

## 2022-05-29 DIAGNOSIS — I7143 Infrarenal abdominal aortic aneurysm, without rupture: Secondary | ICD-10-CM

## 2022-05-29 DIAGNOSIS — Z8679 Personal history of other diseases of the circulatory system: Secondary | ICD-10-CM

## 2022-05-29 DIAGNOSIS — I4729 Other ventricular tachycardia: Secondary | ICD-10-CM

## 2022-05-29 DIAGNOSIS — E782 Mixed hyperlipidemia: Secondary | ICD-10-CM

## 2022-05-29 NOTE — Assessment & Plan Note (Signed)
Chronic, ongoing.  Continue collaboration with cardiology and reviewed recommendations. Euvolemic on exam.  Recent EF 09/06/21 was 35-40%. - Reminded to call for an overnight weight gain of >2 pounds or a weekly weight weight of >5 pounds - not adding salt to his food and has been reading food labels. Reviewed the importance of keeping daily sodium intake to 2000mg  daily  - Avoid Ibuprofen products

## 2022-05-29 NOTE — Assessment & Plan Note (Signed)
Ongoing and stable on imaging 2021.  Discussed need for repeat imaging with him, he declines at this time.  Continue good BP control.  Refuses statin.

## 2022-05-29 NOTE — Assessment & Plan Note (Signed)
Stable, ICD in place.  Continue collaboration with cardiology team.

## 2022-05-29 NOTE — Assessment & Plan Note (Signed)
Chronic, ongoing.  BP remains below goal for age.  Continue current medication regimen and collaboration with cardiology and nephrology. CBC and CMP today. Return in 6 months.

## 2022-05-29 NOTE — Assessment & Plan Note (Signed)
Continue collaboration with cardiology for checks. 

## 2022-05-29 NOTE — Assessment & Plan Note (Signed)
Chronic, no statin use, refuses.  With advanced age this is appropriate, will continue to monitor and focus on diet.

## 2022-05-29 NOTE — Assessment & Plan Note (Signed)
Chronic, ongoing.  Continue collaboration with nephrology and review recommendations.  CMP today.

## 2022-05-29 NOTE — Assessment & Plan Note (Signed)
Followed by cardiology, continue this collaboration. ?

## 2022-05-29 NOTE — Progress Notes (Signed)
BP 110/69   Pulse 66   Temp 97.7 F (36.5 C) (Oral)   Ht 5\' 10"  (1.778 m)   Wt 181 lb 3.2 oz (82.2 kg)   SpO2 99%   BMI 26.00 kg/m    Subjective:    Patient ID: Roy Palmer, male    DOB: 01/21/1930, 88 y.o.   MRN: [id number]  HPI: Roy Palmer is a 88 y.o. male  Chief Complaint  Patient presents with   Chronic Kidney Disease   HF   HYPERTENSION / HYPERLIPIDEMIA WITH CHF He is followed by cardiology with last visit 03/08/22 -- returns 06/16/22 Dr. Saunders Revel.  In 09/06/21 EF was 35-40%.  History of v tach and has ICD in place -- gets checks with cardiology.  On review of cardiology note patient is intolerant to all classes HF therapy.  At this time is on Pacerone + ASA.    He has a known thoracic aortic aneurysm noted on 2017 CT measuring 3.5 AP at that time. Discussed risks and benefits of low dose CT for AAA monitoring- pt does not wish to obtain at this time, last check was 10/22/19 - 4 cm. Not taking statin, does not wish to.  Last cholesterol levels elevated and he reported wishes not to start medication.  Diagnosed with right-sided Bell's palsy on 02/22/20 -- continues to have issues with this and had to have eye pulled back a few times he reports. Satisfied with current treatment? yes Duration of hypertension: chronic BP monitoring frequency: once a month BP range: 110/70 range, sometimes a little lower BP medication side effects: no Duration of hyperlipidemia: chronic Cholesterol medication side effects: no Cholesterol supplements: none Aspirin: yes Recent stressors: no Recurrent headaches: no Visual changes: no Palpitations: no Dyspnea: no Chest pain: no Lower extremity edema: no Dizzy/lightheaded: no    CHRONIC KIDNEY DISEASE III Has been seen by nephrology, last 01/16/22.  CRT 1.76, GFR 37.   CKD status: stable Medications renally dose: no Previous renal evaluation: yes Pneumovax:  Up to Date Influenza Vaccine:  Up to Date    Relevant past medical, surgical,  family and social history reviewed and updated as indicated. Interim medical history since our last visit reviewed. Allergies and medications reviewed and updated.  Review of Systems  Constitutional:  Negative for activity change, diaphoresis, fatigue and fever.  Respiratory:  Negative for cough, chest tightness, shortness of breath and wheezing.   Cardiovascular:  Negative for chest pain, palpitations and leg swelling.  Gastrointestinal: Negative.   Neurological: Negative.   Psychiatric/Behavioral: Negative.     Per HPI unless specifically indicated above     Objective:    BP 110/69   Pulse 66   Temp 97.7 F (36.5 C) (Oral)   Ht 5\' 10"  (1.778 m)   Wt 181 lb 3.2 oz (82.2 kg)   SpO2 99%   BMI 26.00 kg/m   Wt Readings from Last 3 Encounters:  05/29/22 181 lb 3.2 oz (82.2 kg)  05/09/22 178 lb 1.6 oz (80.8 kg)  03/08/22 178 lb 9.6 oz (81 kg)    Physical Exam Vitals and nursing note reviewed.  Constitutional:      General: He is awake. He is not in acute distress.    Appearance: He is well-developed. He is not ill-appearing.  HENT:     Head: Normocephalic and atraumatic.     Right Ear: Hearing normal. No drainage.     Left Ear: Hearing normal. No drainage.  Eyes:  General: Lids are normal.        Right eye: No discharge.        Left eye: No discharge.     Conjunctiva/sclera: Conjunctivae normal.     Pupils: Pupils are equal, round, and reactive to light.  Neck:     Thyroid: No thyromegaly.     Vascular: No carotid bruit.  Cardiovascular:     Rate and Rhythm: Normal rate and regular rhythm.     Heart sounds: Normal heart sounds, S1 normal and S2 normal. No murmur heard.    No gallop.  Pulmonary:     Effort: Pulmonary effort is normal. No accessory muscle usage or respiratory distress.     Breath sounds: Normal breath sounds.  Abdominal:     General: Bowel sounds are normal.     Palpations: Abdomen is soft.  Musculoskeletal:        General: Normal range of  motion.     Cervical back: Normal range of motion and neck supple.     Right lower leg: No edema.     Left lower leg: No edema.  Skin:    General: Skin is warm and dry.  Neurological:     Mental Status: He is alert and oriented to person, place, and time.  Psychiatric:        Attention and Perception: Attention normal.        Mood and Affect: Mood normal.        Speech: Speech normal.        Behavior: Behavior normal. Behavior is cooperative.        Thought Content: Thought content normal.        Judgment: Judgment normal.    Results for orders placed or performed in visit on 03/18/22  CUP PACEART REMOTE DEVICE CHECK  Result Value Ref Range   Date Time Interrogation Session [id number]    Pulse Generator Manufacturer MERM    Pulse Gen Model [id number] Evera MRI XT DR    Pulse Gen Serial Number [id number]    Clinic Name [facility]    Implantable Pulse Generator Type Implantable Cardiac Defibulator    Implantable Pulse Generator Implant Date [id number]    Implantable Lead Manufacturer MERM    Implantable Lead Model 5076 CapSureFix Novus MRI SureScan    Implantable Lead Serial Number [id number]    Implantable Lead Implant Date [id number]    Implantable Lead Location Detail 1 APPENDAGE    Implantable Lead Location [id number]    Implantable Lead Manufacturer [facility]    Implantable Lead Model [id number] Sprint Quattro Secure S MRI SureScan    Implantable Lead Serial Number [id number]    Implantable Lead Implant Date [id number]    Implantable Lead Location Detail 1 APEX    Implantable Lead Location [id number]    Lead Channel Setting Sensing Sensitivity 0.3 mV   Lead Channel Setting Pacing Amplitude 2 V   Lead Channel Setting Pacing Pulse Width 0.4 ms   Lead Channel Setting Pacing Amplitude 2.5 V   Lead Channel Impedance Value 361 ohm   Lead Channel Sensing Intrinsic Amplitude 1.625 mV   Lead Channel Sensing Intrinsic Amplitude 1.625 mV   Lead Channel Pacing Threshold Amplitude 0.625 V   Lead  Channel Pacing Threshold Pulse Width 0.4 ms   Lead Channel Impedance Value 418 ohm   Lead Channel Impedance Value 361 ohm   Lead Channel Sensing Intrinsic Amplitude 11.25 mV   Lead Channel Sensing Intrinsic Amplitude 11.25 mV   Lead Channel Pacing Threshold  Amplitude 0.75 V   Lead Channel Pacing Threshold Pulse Width 0.4 ms   HighPow Impedance 76 ohm   Battery Status OK    Battery Remaining Longevity 26 mo   Battery Voltage 2.93 V   Brady Statistic RA Percent Paced 88.98 %   Brady Statistic RV Percent Paced 99.94 %   Brady Statistic AP VP Percent 89.03 %   Brady Statistic AS VP Percent 10.95 %   Brady Statistic AP VS Percent 0.01 %   Brady Statistic AS VS Percent 0.01 %      Assessment & Plan:   Problem List Items Addressed This Visit       Cardiovascular and Mediastinum   Chronic HFrEF (heart failure with reduced ejection fraction) (HCC) - Primary    Chronic, ongoing.  Continue collaboration with cardiology and reviewed recommendations. Euvolemic on exam.  Recent EF 09/06/21 was 35-40%. - Reminded to call for an overnight weight gain of >2 pounds or a weekly weight weight of >5 pounds - not adding salt to his food and has been reading food labels. 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  BP remains below goal for age.  Continue current medication regimen and collaboration with cardiology and nephrology. CBC and CMP today. Return in 6 months.       Relevant Orders   Comprehensive metabolic panel   Lipid Panel w/o Chol/HDL Ratio   TSH   Nonischemic cardiomyopathy (HCC)    Followed by cardiology, continue this collaboration.       Relevant Orders   CBC with Differential/Platelet   Comprehensive metabolic panel   RESOLVED: NSVT  (nonsustained ventricular tachycardia) (HCC)   Thoracic aortic aneurysm (HCC)    Ongoing and stable on imaging 2021.  Discussed need for repeat imaging with him, he declines at this time.  Continue good BP control.  Refuses statin.        Endocrine   IFG (impaired fasting glucose)    A1C remains last in 2021 6.1%.  Continue diet focus at home.  Recheck today.      Relevant Orders   HgB A1c     Genitourinary   CKD (chronic kidney disease), stage IIIa    Chronic, ongoing.  Continue collaboration with nephrology and review recommendations.  CMP today.      Relevant Orders   CBC with Differential/Platelet   Comprehensive metabolic panel     Other   History of ventricular tachycardia    Stable, ICD in place.  Continue collaboration with cardiology team.      Hyperlipidemia    Chronic, no statin use, refuses.  With advanced age this is appropriate, will continue to monitor and focus on diet.      Relevant Orders   Comprehensive metabolic panel   Lipid Panel w/o Chol/HDL Ratio   ICD (implantable cardioverter-defibrillator) in place    Continue collaboration with cardiology for checks.        Follow up plan: Return in about 6 months (around 11/28/2022) for HTN/HLD, CKD, THORACIC ANEURYSM.

## 2022-05-29 NOTE — Assessment & Plan Note (Signed)
A1C remains last in 2021 6.1%.  Continue diet focus at home.  Recheck today.

## 2022-05-30 LAB — LIPID PANEL W/O CHOL/HDL RATIO
Cholesterol, Total: 218 mg/dL — ABNORMAL HIGH (ref 100–199)
HDL: 45 mg/dL (ref >39)
LDL Chol Calc (NIH): 131 mg/dL — ABNORMAL HIGH (ref 0–99)
Triglycerides: 235 mg/dL — ABNORMAL HIGH (ref 0–149)
VLDL Cholesterol Cal: 42 mg/dL — ABNORMAL HIGH (ref 5–40)

## 2022-05-30 LAB — CBC WITH DIFFERENTIAL/PLATELET
Basophils Absolute: 0.1 10*3/uL (ref 0.0–0.2)
Basos: 1 %
EOS (ABSOLUTE): 0.2 10*3/uL (ref 0.0–0.4)
Eos: 4 %
Hematocrit: 44 % (ref 37.5–51.0)
Hemoglobin: 14.5 g/dL (ref 13.0–17.7)
Immature Grans (Abs): 0 10*3/uL (ref 0.0–0.1)
Immature Granulocytes: 0 %
Lymphocytes Absolute: 1.3 10*3/uL (ref 0.7–3.1)
Lymphs: 18 %
MCH: 30.9 pg (ref 26.6–33.0)
MCHC: 33 g/dL (ref 31.5–35.7)
MCV: 94 fL (ref 79–97)
Monocytes Absolute: 0.5 10*3/uL (ref 0.1–0.9)
Monocytes: 7 %
Neutrophils Absolute: 4.8 10*3/uL (ref 1.4–7.0)
Neutrophils: 70 %
Platelets: 156 10*3/uL (ref 150–450)
RBC: 4.69 x10E6/uL (ref 4.14–5.80)
RDW: 12.6 % (ref 11.6–15.4)
WBC: 6.9 10*3/uL (ref 3.4–10.8)

## 2022-05-30 LAB — HEMOGLOBIN A1C
Est. average glucose Bld gHb Est-mCnc: 131 mg/dL
Hgb A1c MFr Bld: 6.2 % — ABNORMAL HIGH (ref 4.8–5.6)

## 2022-05-30 LAB — COMPREHENSIVE METABOLIC PANEL
ALT: 23 IU/L (ref 0–44)
AST: 19 IU/L (ref 0–40)
Albumin/Globulin Ratio: 2.3 — ABNORMAL HIGH (ref 1.2–2.2)
Albumin: 4.3 g/dL (ref 3.6–4.6)
Alkaline Phosphatase: 60 IU/L (ref 44–121)
BUN/Creatinine Ratio: 17 (ref 10–24)
BUN: 29 mg/dL (ref 10–36)
Bilirubin Total: 0.4 mg/dL (ref 0.0–1.2)
CO2: 24 mmol/L (ref 20–29)
Calcium: 9.4 mg/dL (ref 8.6–10.2)
Chloride: 103 mmol/L (ref 96–106)
Creatinine, Ser: 1.67 mg/dL — ABNORMAL HIGH (ref 0.76–1.27)
Globulin, Total: 1.9 g/dL (ref 1.5–4.5)
Glucose: 93 mg/dL (ref 70–99)
Potassium: 4.7 mmol/L (ref 3.5–5.2)
Sodium: 141 mmol/L (ref 134–144)
Total Protein: 6.2 g/dL (ref 6.0–8.5)
eGFR: 39 mL/min/{1.73_m2} — ABNORMAL LOW (ref >59)

## 2022-05-30 LAB — TSH: TSH: 1.3 u[IU]/mL (ref 0.450–4.500)

## 2022-05-30 NOTE — Progress Notes (Signed)
Contacted via MyChart   Good afternoon Roy Palmer, your labs have returned: - Kidney function, creatinine and eGFR, continues to show kidney disease stage 3b present with no worsening.  We will continue to monitor this closely.  Liver function, AST and ALT, is normal. - Cholesterol labs remain elevated -- I know you prefer no medication for this. - CBC shows no anemia or infection.  TSH, thyroid testing, normal. - A1c is 6.2%, this is in prediabetes range as on previous checks.  Continue focus on healthy diet and regular activity.  Any questions? Keep being wonderful!!  Thank you for allowing me to participate in your care.  I appreciate you. Kindest regards, Royce Sciara

## 2022-06-12 ENCOUNTER — Encounter: Payer: Self-pay | Admitting: Internal Medicine

## 2022-06-12 ENCOUNTER — Ambulatory Visit: Payer: Medicare Other | Attending: Medical | Admitting: Internal Medicine

## 2022-06-12 VITALS — BP 122/66 | HR 64 | Ht 71.0 in | Wt 180.8 lb

## 2022-06-12 DIAGNOSIS — I472 Ventricular tachycardia, unspecified: Secondary | ICD-10-CM

## 2022-06-12 DIAGNOSIS — I38 Endocarditis, valve unspecified: Secondary | ICD-10-CM

## 2022-06-12 DIAGNOSIS — I2510 Atherosclerotic heart disease of native coronary artery without angina pectoris: Secondary | ICD-10-CM | POA: Diagnosis not present

## 2022-06-12 DIAGNOSIS — I5022 Chronic systolic (congestive) heart failure: Secondary | ICD-10-CM

## 2022-06-12 NOTE — Progress Notes (Signed)
Follow-up Outpatient Visit Date: 06/12/2022  Primary Care Provider: Venita Lick, NP [place] [id number]  Chief Complaint: Follow-up neuropathy with history of mitral regurgitation and ventricular tachycardia  HPI:  Mr. Doggett is a 89 y.o. male with history of severe mitral regurgitation status post mitral valve repair (03/2016), ventricular tachycardia status post ICD (03/2016), nonischemic cardiomyopathy with chronic systolic heart failure, nonobstructive CAD, hypertension, hyperlipidemia, and chronic kidney disease III, who presents for follow-up of cardiomyopathy and valvular heart disease.  He was last seen in our office in October by Tarri Glenn, [place], at which time he was feeling fairly well without chest pain, shortness of breath, palpitations, and edema.  He had been started on empagliflozin at his prior visit in August, though this was subsequently stopped due to worsening renal function.  He was started on spironolactone 12.5 mg daily in October, with stable renal function and potassium on most recent follow-up labs at the [facility].  Today, Mr. Kinley reports that he is feeling well.  He has not had any chest pain, shortness of breath, palpitations, lightheadedness, or edema.  He has not had any syncope or ICD shocks.  He did not tolerate spironolactone (he has not tolerated any goal-directed medical therapy in the past).  He remains on amiodarone, managed by Dr. Lovena Le.  --------------------------------------------------------------------------------------------------  Cardiovascular History & Procedures: Cardiovascular Problems: Severe mitral regurgitation status post repair (57/3220) Chronic systolic heart failure secondary to nonischemic cardiomyopathy Frequent ventricular ectopy with sustained ventricular tachycardia status post ICD Nonobstructive coronary artery disease   Risk Factors: Age, male gender, hypertension, and hyperlipidemia    Cath/PCI: LHC/RHC (02/23/16): Mild, nonobstructive CAD (30% ostial LMCA, 30% ostial LAD, 30% ostial ramus, and minimal luminal irregularities of dominant LCx. Normal right heart filling pressures. Decreased Fick cardiac output (CO 3.6 L/m, CI 1.8 L/m/m) in setting of frequent PVCs and severe MR.   CV Surgery: Minimally invasive mitral valve repair (03/19/16, Dr. Roxy Manns)   EP Procedures and Devices: 24-hour Holter monitor (02/27/16): Predominantly sinus rhythm with average heart rate is 63 bpm (range 49-91 bpm). Longest RR interval 1.6 seconds. Frequent PVCs noted (11% burden) including frequent couplets and bigeminal cycles. 90 runs of NSVT were noted lasting up to 9 beats the maximum rate of 134 bpm. Rare SVT and supraventricular ectopy noted. Dual chamber ICD (03/27/16, [employer])   Non-Invasive Evaluation(s): TTE (09/12/2021): Normal LV size with mild LVH.  LVEF 35-40% with grade 2 diastolic dysfunction.  GLS -7.3%.  Normal RV size and function.  Severe left atrial enlargement.  Repaired mitral valve present with moderate regurgitation.  Mean gradient 2 mmHg.  Mild tricuspid regurgitation.  Normal aortic valve.  Mildly dilated aortic root, measuring 4.0 cm. TTE (12/21/2019): Normal LV size with moderate LVH.  LVEF 45-50% with global hypokinesis.  Mildly dilated RV with mild RV wall thickening and normal contraction.  Normal PA pressure.  Normal biatrial size.  Mild mitral regurgitation.  Aortic sclerosis without stenosis. Left arm venous Duplex (07/06/19): No evidence of DVT. TTE (04/25/17): Mildly dilated LV with moderate LVH. LVEF 25-30% with diffuse hypokinesis. Aortic sclerosis. Moderately elevated transmitral gradient status post repair. Moderate mitral regurgitation. Mild to moderate left atrial enlargement. Mildly reduced RV contraction. Limited TTE (02/11/17): LVEF 25-30% with probable akinesis of the inferolateral and inferior myocardium. Grade 2 diastolic dysfunction. Mild LA enlargement. TTE  (09/03/16): Moderately dilated left ventricle with LVEF less than 20% and global hypokinesis. Prior surgical repair of mitral valve is evident with  moderate regurgitation. Left atrium is moderately dilated. RV size and function are normal. Moderate pulmonary hypertension noted with RVSP of 54 mmHg. Limited TTE (03/22/16): Normal LV size with moderate LVH and LVEF of 40-45% with possible inferior hypokinesis and incoordinate septal motion. Mitral angioplasty ring in place with trivial MR. No significant mitral stenosis. Mild left atrial enlargement. Normal RV was moderately reduced systolic function. RVSP 41 mm or mercury. Elevated central venous pressure. TTE (02/14/16): Normal LV size with moderate LVH. EF mildly reduced at 50% with possible inferolateral hypokinesis. Moderate to severe MR noted with prolapse/flail of the posterior leaflet. Severe left atrial enlargement. Moderate TR. TEE (02/23/16): Normal LV size and wall thickness with lower normal LVEF. Trivial AI. Severe, eccentric MR with reversal of pulmonary vein flow and flail posterior leaflet. No left atrial thrombus. Lipomatous hypertrophy of the septum. Mild to moderate TR. Limited TTE (03/22/16): Normal LV size with moderate LVH. LVEF 40-45% with possible inferior hypokinesis. Trivial MR with mild left atrial enlargement. Normal RV size with moderately reduced systolic function. Mild TR. No pericardial effusion.  Recent CV Pertinent Labs: Lab Results  Component Value Date   CHOL 218 (H) 05/29/2022   CHOL 188 07/29/2019   HDL 45 05/29/2022   LDLCALC 131 (H) 05/29/2022   LDLDIRECT 125 (H) 09/12/2021   TRIG 235 (H) 05/29/2022   TRIG 320 (H) 07/29/2019   CHOLHDL 4.6 09/12/2021   CHOLHDL 5.5 12/22/2019   INR 1.0 12/21/2019   BNP 149.0 (H) 12/21/2019   K 4.7 05/29/2022   MG 2.5 (H) 10/24/2019   BUN 29 05/29/2022   CREATININE 1.67 (H) 05/29/2022    Past medical and surgical history were reviewed and updated in EPIC.  Current Meds   Medication Sig   acetaminophen (TYLENOL) 325 MG tablet Take 325-650 mg by mouth every 6 (six) hours as needed for mild pain, moderate pain or fever.    amiodarone (PACERONE) 200 MG tablet Take 1 tablet (200 mg total) by mouth See admin instructions. TAKE 1 TABLET BY MOUTH EVERY DAY MONDAY THROUGH SATURDAY. DO NOT TAKE ON SUNDAY   Multiple Vitamins-Minerals (MULTIVITAMIN ADULTS 50+ PO) Take 1 tablet by mouth daily.    Allergies: Amiodarone and Lyrica [pregabalin]  Social History   Tobacco Use   Smoking status: Never   Smokeless tobacco: Never  Vaping Use   Vaping Use: Never used  Substance Use Topics   Alcohol use: No   Drug use: No    Family History  Problem Relation Age of Onset   Hypertension Mother    Stroke Mother    Heart disease Father    Heart attack Father    Dementia Sister    Retinoblastoma Son    COPD Neg Hx    Cancer Neg Hx    Diabetes Neg Hx     Review of Systems: A 12-system review of systems was performed and was negative except as noted in the HPI.  --------------------------------------------------------------------------------------------------  Physical Exam: BP 122/66 (BP Location: Left Arm, Patient Position: Sitting, Cuff Size: Normal)   Pulse 64   Ht 5\' 11"  (1.803 m)   Wt 180 lb 12.8 oz (82 kg)   SpO2 95%   BMI 25.22 kg/m   General:  NAD. Neck: No JVD or HJR. Lungs: Clear to auscultation bilaterally without wheezes or crackles. Heart: Regular rate and rhythm with 2/6 systolic murmur. Abdomen: Soft, nontender, nondistended. Extremities: No lower extremity edema.  EKG: AV paced rhythm.  No significant change from prior tracing on 03/08/2022.  Lab Results  Component Value Date   WBC 6.9 05/29/2022   HGB 14.5 05/29/2022   HCT 44.0 05/29/2022   MCV 94 05/29/2022   PLT 156 05/29/2022    Lab Results  Component Value Date   NA 141 05/29/2022   K 4.7 05/29/2022   CL 103 05/29/2022   CO2 24 05/29/2022   BUN 29 05/29/2022    CREATININE 1.67 (H) 05/29/2022   GLUCOSE 93 05/29/2022   ALT 23 05/29/2022    Lab Results  Component Value Date   CHOL 218 (H) 05/29/2022   HDL 45 05/29/2022   LDLCALC 131 (H) 05/29/2022   LDLDIRECT 125 (H) 09/12/2021   TRIG 235 (H) 05/29/2022   CHOLHDL 4.6 09/12/2021    --------------------------------------------------------------------------------------------------  ASSESSMENT AND PLAN: Chronic systolic heart failure due to valvular heart disease: Despite continued severely reduced LVEF, Mr. Reisig feels well with NYHA class I symptoms.  He appears euvolemic on examination today.  Moderate MR appreciated on most recent echocardiogram in April.  Unfortunately, Mr. Waring has not tolerated any goal-directed medical therapy and does not wish to be rechallenged.  Continue low-dose aspirin.  Ventricular tachycardia: Continue amiodarone and ICD management per Dr. Ladona Ridgel.  Recent labs show stable renal function and electrolytes.  Coronary artery disease: No angina reported.  Cath in 2017 showed nonobstructive disease.  Mr. Camplin has been intolerant of statins in the past.  Continue low-dose aspirin.  Follow-up: Return to clinic in 6 months.  [name], MD 06/13/2022 7:48 AM

## 2022-06-12 NOTE — Patient Instructions (Signed)
Medication Instructions:  Your Physician recommend you continue on your current medication as directed.    *If you need a refill on your cardiac medications before your next appointment, please call your pharmacy*   Lab Work: None ordered today   Testing/Procedures: None ordered today   Follow-Up: At [facility], you and your health needs are our priority.  As part of our continuing mission to provide you with exceptional heart care, we have created designated Provider Care Teams.  These Care Teams include your primary Cardiologist (physician) and Advanced Practice Providers (APPs -  Physician Assistants and Nurse Practitioners) who all work together to provide you with the care you need, when you need it.  We recommend signing up for the patient portal called "MyChart".  Sign up information is provided on this After Visit Summary.  MyChart is used to connect with patients for Virtual Visits (Telemedicine).  Patients are able to view lab/test results, encounter notes, upcoming appointments, etc.  Non-urgent messages can be sent to your provider as well.   To learn more about what you can do with MyChart, go to [email or web address].    Your next appointment:   6 month(s)  Provider:   You may see Christopher End, MD or one of the following Advanced Practice Providers on your designated Care Team:   Christopher Berge, NP Ryan Dunn, PA-C Cadence Furth, PA-C Sheri Hammock, NP     

## 2022-06-13 DIAGNOSIS — I472 Ventricular tachycardia, unspecified: Secondary | ICD-10-CM | POA: Insufficient documentation

## 2022-06-17 ENCOUNTER — Ambulatory Visit (INDEPENDENT_AMBULATORY_CARE_PROVIDER_SITE_OTHER): Payer: Medicare Other

## 2022-06-17 DIAGNOSIS — I472 Ventricular tachycardia, unspecified: Secondary | ICD-10-CM

## 2022-06-18 NOTE — Addendum Note (Signed)
Addended by: Kathaleen Dudziak G on: 06/18/2022 10:00 AM   Modules accepted: Orders  

## 2022-06-19 LAB — CUP PACEART REMOTE DEVICE CHECK
Battery Remaining Longevity: 24 mo
Battery Voltage: 2.92 V
Brady Statistic AP VP Percent: 91.19 %
Brady Statistic AP VS Percent: 0.01 %
Brady Statistic AS VP Percent: 8.78 %
Brady Statistic AS VS Percent: 0.02 %
Brady Statistic RA Percent Paced: 91.12 %
Brady Statistic RV Percent Paced: 99.92 %
Date Time Interrogation Session: 20240115012505
HighPow Impedance: 70 Ohm
Implantable Lead Connection Status: 753985
Implantable Lead Connection Status: 753985
Implantable Lead Implant Date: 20171025
Implantable Lead Implant Date: 20171025
Implantable Lead Location: 753859
Implantable Lead Location: 753860
Implantable Lead Model: 5076
Implantable Pulse Generator Implant Date: 20171025
Lead Channel Impedance Value: 361 Ohm
Lead Channel Impedance Value: 399 Ohm
Lead Channel Impedance Value: 456 Ohm
Lead Channel Pacing Threshold Amplitude: 0.625 V
Lead Channel Pacing Threshold Amplitude: 0.625 V
Lead Channel Pacing Threshold Pulse Width: 0.4 ms
Lead Channel Pacing Threshold Pulse Width: 0.4 ms
Lead Channel Sensing Intrinsic Amplitude: 1.375 mV
Lead Channel Sensing Intrinsic Amplitude: 1.375 mV
Lead Channel Sensing Intrinsic Amplitude: 11.25 mV
Lead Channel Sensing Intrinsic Amplitude: 11.25 mV
Lead Channel Setting Pacing Amplitude: 2 V
Lead Channel Setting Pacing Amplitude: 2.5 V
Lead Channel Setting Pacing Pulse Width: 0.4 ms
Lead Channel Setting Sensing Sensitivity: 0.3 mV
Zone Setting Status: 755011

## 2022-07-17 ENCOUNTER — Ambulatory Visit: Payer: Self-pay

## 2022-07-17 NOTE — Telephone Encounter (Signed)
  Chief Complaint: cough Symptoms: non productive cough Frequency: 1 week Pertinent Negatives: Patient denies SOB fever, congestion or runny nose Disposition: [] ED /[] Urgent Care (no appt availability in office) / [] Appointment(In office/virtual)/ []  [facility]/ [x] Home Care/ [] Refused Recommended Disposition /[] [facility]/ []  Follow-up with PCP Additional Notes: pt has tried OTC Mucinex and Tylenol cold and flu and still has cough, also did home COVID test that was negative. Pt asking for other suggestions since unable to come in d/t no transportation and doesn't know when son could bring him in. Care advice given and pt verbalized will try that and if no improvement will call back.   Summary: cough   Pt asked if Jolene can all in something to help his cough / please advise / pt has tried OTC items that havent helped         Reason for Disposition  Cough  Answer Assessment - Initial Assessment Questions 1. ONSET: "When did the cough begin?"      1 week  2. SEVERITY: "How bad is the cough today?"      Moderate  3. SPUTUM: "Describe the color of your sputum" (none, dry cough; clear, white, yellow, green)     None  5. DIFFICULTY BREATHING: "Are you having difficulty breathing?" If Yes, ask: "How bad is it?" (e.g., mild, moderate, severe)    - MILD: No SOB at rest, mild SOB with walking, speaks normally in sentences, can lie down, no retractions, pulse < 100.    - MODERATE: SOB at rest, SOB with minimal exertion and prefers to sit, cannot lie down flat, speaks in phrases, mild retractions, audible wheezing, pulse 100-120.    - SEVERE: Very SOB at rest, speaks in single words, struggling to breathe, sitting hunched forward, retractions, pulse > 120      no 6. FEVER: "Do you have a fever?" If Yes, ask: "What is your temperature, how was it measured, and when did it start?"     no 10. OTHER SYMPTOMS: "Do you have any other symptoms?" (e.g., runny nose, wheezing,  chest pain)  Protocols used: Cough - Acute Non-Productive-A-AH

## 2022-07-22 ENCOUNTER — Telehealth: Payer: Self-pay | Admitting: Nurse Practitioner

## 2022-07-22 NOTE — Telephone Encounter (Signed)
Contacted [name] to schedule their annual wellness visit. Appointment made for 08/06/2022.  [facility] Direct Dial: [phone]

## 2022-08-05 NOTE — Progress Notes (Signed)
Remote ICD transmission.   

## 2022-08-06 ENCOUNTER — Ambulatory Visit (INDEPENDENT_AMBULATORY_CARE_PROVIDER_SITE_OTHER): Payer: Medicare Other

## 2022-08-06 VITALS — Ht 71.0 in | Wt 180.0 lb

## 2022-08-06 DIAGNOSIS — Z0000 Encounter for general adult medical examination without abnormal findings: Secondary | ICD-10-CM

## 2022-08-06 NOTE — Progress Notes (Signed)
I connected with  [name] on 08/06/22 by a audio enabled telemedicine application and verified that I am speaking with the correct person using two identifiers.  Patient Location: Home  Provider Location: Office/Clinic  I discussed the limitations of evaluation and management by telemedicine. The patient expressed understanding and agreed to proceed.  Subjective:   Roy Palmer is a 89 y.o. male who presents for Medicare Annual/Subsequent preventive examination.  Review of Systems     Cardiac Risk Factors include: advanced age (>76mn, >>69women);family history of premature cardiovascular disease;male gender;hypertension     Objective:    There were no vitals filed for this visit. There is no height or weight on file to calculate BMI.     08/06/2022    2:37 PM 07/24/2021   12:01 PM 07/17/2020    1:02 PM 12/21/2019    3:00 PM 12/21/2019   11:37 AM 10/21/2019    2:03 AM 10/20/2019    4:39 PM  Advanced Directives  Does Patient Have a Medical Advance Directive? No Yes Yes No No Yes Yes  Type of AArmed forces technical officerof [place] will   [place] Does patient want to make changes to medical advance directive?      No - Patient declined No - Patient declined  Copy of [place] Chart?  Yes - validated most recent copy scanned in chart (See row information) No - copy requested   No - copy requested No - copy requested  Would patient like information on creating a medical advance directive? No - Patient declined   No - Patient declined No - Patient declined      Current Medications (verified) Outpatient Encounter Medications as of 08/06/2022  Medication Sig   acetaminophen (TYLENOL) 325 MG tablet Take 325-650 mg by mouth every 6 (six) hours as needed for mild pain, moderate pain or fever.    amiodarone (PACERONE) 200 MG tablet Take 1 tablet (200 mg  total) by mouth See admin instructions. TAKE 1 TABLET BY MOUTH EVERY DAY MONDAY THROUGH SATURDAY. DO NOT TAKE ON SUNDAY   Multiple Vitamins-Minerals (MULTIVITAMIN ADULTS 50+ PO) Take 1 tablet by mouth daily.   No facility-administered encounter medications on file as of 08/06/2022.    Allergies (verified) Amiodarone and Lyrica [pregabalin]   History: Past Medical History:  Diagnosis Date   AAA (abdominal aortic aneurysm) without rupture ([place] 03/12/2016   Small - measured 3.3 x 3.5 cm by CTA   CKD (chronic kidney disease) stage 3, GFR 30-59 ml/min (HCC)    Essential hypertension    Hyperlipidemia    Hypertensive kidney disease    Incidental pulmonary nodule 03/12/2016   Vague opacity right lung base requires radiographic follow up - index of suspicion is LOW   Near syncope    Cardiogenic, related to an SVT and severe MR   Non-sustained ventricular tachycardia (HCC)    S/P minimally invasive mitral valve repair 03/19/2016   Complex valvuloplasty including triangular resection of flail segment of posterior leaflet, chordal transposition x1, artificial Gore-tex neochord placement x4 and 34 mm Sorin Memo 3D ring annuloplasty via right mini thoracotomy approach with clipping of LA appendage   Severe mitral regurgitation by prior echocardiogram 02/23/2016   Confirmed by TEE   Syncope 03/15/2016   Tennis elbow left   Thoracic aortic aneurysm ([place] 03/12/2016   Very very mild fusiform enlargement of distal transverse and  proximal descending thoracic aorta noted on CTA   Trigeminal neuralgia    Past Surgical History:  Procedure Laterality Date   CARDIAC CATHETERIZATION N/A 02/23/2016   Procedure: Right/Left Heart Cath and Coronary Angiography;  Surgeon: Nelva Bush, MD;  Location: [facility];  Service: Cardiovascular: Angiographic minimal coronary disease. Normal left and right heart Pressures. Decreased CO/CI in settng of frequent PVCs & Severe MR.   CATARACT EXTRACTION,  BILATERAL     COLONOSCOPY  08/2005   CYST EXCISION  10/2013   from neck   EP IMPLANTABLE DEVICE N/A 03/27/2016   Procedure: ICD Implant Dual Chamber;  Surgeon: Evans Lance, MD;  Location: [facility];  Service: Cardiovascular;  Laterality: N/A;   ESOPHAGOGASTRODUODENOSCOPY  02/2010   MITRAL VALVE REPAIR Right 03/19/2016   Procedure: MINIMALLY INVASIVE MITRAL VALVE REPAIR (MVR);  Surgeon: Rexene Alberts, MD;  Location: [facility];  Service: Open Heart Surgery;  Laterality: Right;   TEE WITHOUT CARDIOVERSION N/A 02/23/2016   Procedure: TRANSESOPHAGEAL ECHOCARDIOGRAM (TEE);  Surgeon: Sueanne Margarita, MD;  Location: [facility];  Service: Cardiovascular: Normal LV size and function.  Degenerative mitral valve disease with failed posterior leaflet (P2 segment), Severe MR with pulmonary vein systolic flow reversal. Moderate-TR.     TEE WITHOUT CARDIOVERSION N/A 03/19/2016   Procedure: TRANSESOPHAGEAL ECHOCARDIOGRAM (TEE);  Surgeon: Rexene Alberts, MD;  Location: [facility];  Service: Open Heart Surgery;  Laterality: N/A;   TRANSTHORACIC ECHOCARDIOGRAM  02/14/2016   EF 50-55% with moderate LVH. Possible inferolateral hypokinesis. Moderate-severe MR with posterior leaflet prolapse, cannot rule out flail. Severe LA dilation. Moderate TR.   Family History  Problem Relation Age of Onset   Hypertension Mother    Stroke Mother    Heart disease Father    Heart attack Father    Dementia Sister    Retinoblastoma Son    COPD Neg Hx    Cancer Neg Hx    Diabetes Neg Hx    Social History   Socioeconomic History   Marital status: Married    Spouse name: Not on file   Number of children: Not on file   Years of education: Not on file   Highest education level: Associate degree: academic program  Occupational History   Occupation: retired  Tobacco Use   Smoking status: Never   Smokeless tobacco: Never  Vaping Use   Vaping Use: Never used  Substance and Sexual Activity   Alcohol use: No   Drug  use: No   Sexual activity: Not Currently  Other Topics Concern   Not on file  Social History Narrative   Not on file   Social Determinants of Health   Financial Resource Strain: Low Risk  (08/06/2022)   Overall Financial Resource Strain (CARDIA)    Difficulty of Paying Living Expenses: Not hard at all  Food Insecurity: No Food Insecurity (08/06/2022)   Hunger Vital Sign    Worried About Running Out of Food in the Last Year: Never true    [place] in the Last Year: Never true  Transportation Needs: No Transportation Needs (08/06/2022)   PRAPARE - Hydrologist (Medical): No    Lack of Transportation (Non-Medical): No  Physical Activity: Sufficiently Active (08/06/2022)   Exercise Vital Sign    Days of Exercise per Week: 4 days    Minutes of Exercise per Session: 40 min  Stress: No Stress Concern Present (08/06/2022)   [employer]  [employer] - Occupational Stress Questionnaire    Feeling of Stress : Not at all  Social Connections: Moderately Integrated (08/06/2022)   Social Connection and Isolation Panel [NHANES]    Frequency of Communication with Friends and Family: More than three times a week    Frequency of Social Gatherings with Friends and Family: More than three times a week    Attends Religious Services: More than 4 times per year    Active Member of [employer] or Organizations: No    Attends Music therapist: Never    Marital Status: Married    Tobacco Counseling Counseling given: Not Answered   Clinical Intake:  Pre-visit preparation completed: Yes  Pain : No/denies pain     Nutritional Risks: None Diabetes: No  How often do you need to have someone help you when you read instructions, pamphlets, or other written materials from your doctor or pharmacy?: 1 - Never  Diabetic?no  Interpreter Needed?: No  Information entered by :: Kirke Shaggy, LPN   Activities of Daily Living    08/06/2022    2:37 PM   In your present state of health, do you have any difficulty performing the following activities:  Hearing? 0  Vision? 0  Difficulty concentrating or making decisions? 0  Walking or climbing stairs? 0  Dressing or bathing? 0  Doing errands, shopping? 0  Preparing Food and eating ? N  Using the Toilet? N  In the past six months, have you accidently leaked urine? N  Do you have problems with loss of bowel control? N  Managing your Medications? N  Managing your Finances? N  Housekeeping or managing your Housekeeping? N    Patient Care Team: Venita Lick, NP as PCP - General (Nurse Practitioner) End, Harrell Gave, MD as PCP - Cardiology (Cardiology) End, Harrell Gave, MD as Consulting Physician (Cardiology) Evans Lance, MD as Consulting Physician (Cardiology)  Indicate any recent Medical Services you may have received from other than [facility] providers in the past year (date may be approximate).     Assessment:   This is a routine wellness examination for Jurrell.  Hearing/Vision screen Hearing Screening - Comments:: No aids Vision Screening - Comments:: Wears glasses- [facility]  Dietary issues and exercise activities discussed: Current Exercise Habits: Home exercise routine, Type of exercise: walking, Time (Minutes): 40, Frequency (Times/Week): 4, Weekly Exercise (Minutes/Week): 160   Goals Addressed             This Visit's Progress    DIET - EAT MORE FRUITS AND VEGETABLES         Depression Screen    08/06/2022    2:35 PM 05/29/2022    1:45 PM 05/09/2022    1:27 PM 06/21/2021   10:35 AM 07/17/2020    1:03 PM 07/14/2019    2:22 PM 07/22/2018    2:03 PM  PHQ 2/9 Scores  PHQ - 2 Score 0 0 0 0 0 0 0  PHQ- 9 Score 0 0 0 1   0    Fall Risk    08/06/2022    2:37 PM 05/29/2022    1:44 PM 05/09/2022    1:27 PM 07/24/2021   12:00 PM 06/21/2021   10:35 AM  Fall Risk   Falls in the past year? 0 0 0 0 0  Number falls in past yr: 0 0 0 0 0  Injury with Fall? 0 0 0 0 0   Risk for fall due to : No Fall Risks No  Fall Risks No Fall Risks  No Fall Risks  Follow up Falls prevention discussed;Falls evaluation completed Falls evaluation completed Falls evaluation completed Falls evaluation completed;Falls prevention discussed Falls evaluation completed    FALL RISK PREVENTION PERTAINING TO THE HOME:  Any stairs in or around the home? Yes  If so, are there any without handrails? No  Home free of loose throw rugs in walkways, pet beds, electrical cords, etc? Yes  Adequate lighting in your home to reduce risk of falls? Yes   ASSISTIVE DEVICES UTILIZED TO PREVENT FALLS:  Life alert? No  Use of a cane, walker or w/c? No  Grab bars in the bathroom? Yes  Shower chair or bench in shower? Yes  Elevated toilet seat or a handicapped toilet? Yes   Cognitive Function:        08/06/2022    2:41 PM 07/17/2020    1:04 PM 07/08/2018    2:24 PM 06/11/2017    1:22 PM  6CIT Screen  What Year? 0 points 0 points 0 points 0 points  What month? 0 points 0 points 0 points 0 points  What time? 0 points 0 points 0 points 0 points  Count back from 20 4 points 0 points 0 points 0 points  Months in reverse 2 points 2 points 0 points 0 points  Repeat phrase 8 points 6 points 0 points 2 points  Total Score 14 points 8 points 0 points 2 points    Immunizations Immunization History  Administered Date(s) Administered   Fluad Quad(high Dose 65+) 03/19/2019   Influenza,inj,Quad PF,6+ Mos 05/07/2016, 02/19/2017, 04/17/2018   Influenza-Unspecified 04/19/2015   PFIZER(Purple Top)SARS-COV-2 Vaccination 08/25/2019, 09/15/2019   Pneumococcal Conjugate-13 10/29/2013   Pneumococcal Polysaccharide-23 07/14/2008   Td 07/14/2008, 01/20/2019   Zoster, Live 06/03/2010    TDAP status: Up to date  Flu Vaccine status: Declined, Education has been provided regarding the importance of this vaccine but patient still declined. Advised may receive this vaccine at local pharmacy or Health Dept. Aware  to provide a copy of the vaccination record if obtained from local pharmacy or Health Dept. Verbalized acceptance and understanding.  Pneumococcal vaccine status: Due, Education has been provided regarding the importance of this vaccine. Advised may receive this vaccine at local pharmacy or Health Dept. Aware to provide a copy of the vaccination record if obtained from local pharmacy or Health Dept. Verbalized acceptance and understanding.  Covid-19 vaccine status: Completed vaccines  Qualifies for Shingles Vaccine? No   Zostavax completed Yes   Shingrix Completed?: No.    Education has been provided regarding the importance of this vaccine. Patient has been advised to call insurance company to determine out of pocket expense if they have not yet received this vaccine. Advised may also receive vaccine at local pharmacy or Health Dept. Verbalized acceptance and understanding.  Screening Tests Health Maintenance  Topic Date Due   COVID-19 Vaccine (3 - Pfizer risk series) 10/13/2019   Zoster Vaccines- Shingrix (1 of 2) 08/08/2022 (Originally 02/04/1949)   INFLUENZA VACCINE  09/01/2022 (Originally 01/01/2022)   Medicare Annual Wellness (AWV)  08/06/2023   DTaP/Tdap/Td (3 - Tdap) 01/19/2029   Pneumonia Vaccine 76+ Years old  Completed   HPV VACCINES  Aged Out    Health Maintenance  Health Maintenance Due  Topic Date Due   COVID-19 Vaccine (3 - Pfizer risk series) 10/13/2019    Colorectal cancer screening: No longer required.   Lung Cancer Screening: (Low Dose CT Chest recommended if Age 2-80 years, 62  pack-year currently smoking OR have quit w/in 15years.) does not qualify.   Additional Screening:  Hepatitis C Screening: does not qualify; Completed no  Vision Screening: Recommended annual ophthalmology exams for early detection of glaucoma and other disorders of the eye. Is the patient up to date with their annual eye exam?  Yes  Who is the provider or what is the name of the office in  which the patient attends annual eye exams? [place] If pt is not established with a provider, would they like to be referred to a provider to establish care? No .   Dental Screening: Recommended annual dental exams for proper oral hygiene  Community Resource Referral / Chronic Care Management: CRR required this visit?  No   CCM required this visit?  No      Plan:     I have personally reviewed and noted the following in the patient's chart:   Medical and social history Use of alcohol, tobacco or illicit drugs  Current medications and supplements including opioid prescriptions. Patient is not currently taking opioid prescriptions. Functional ability and status Nutritional status Physical activity Advanced directives List of other physicians Hospitalizations, surgeries, and ER visits in previous 12 months Vitals Screenings to include cognitive, depression, and falls Referrals and appointments  In addition, I have reviewed and discussed with patient certain preventive protocols, quality metrics, and best practice recommendations. A written personalized care plan for preventive services as well as general preventive health recommendations were provided to patient.     Dionisio David, LPN   [id number]   Nurse Notes: none

## 2022-08-06 NOTE — Patient Instructions (Signed)
Roy Palmer , Thank you for taking time to come for your Medicare Wellness Visit. I appreciate your ongoing commitment to your health goals. Please review the following plan we discussed and let me know if I can assist you in the future.   These are the goals we discussed:  Goals      DIET - EAT MORE FRUITS AND VEGETABLES     DIET - INCREASE WATER INTAKE     Recommend drinking at least 6-8 glasses of water a day      Patient Stated     07/17/2020, stay healthy        This is a list of the screening recommended for you and due dates:  Health Maintenance  Topic Date Due   COVID-19 Vaccine (3 - Pfizer risk series) 10/13/2019   Zoster (Shingles) Vaccine (1 of 2) 08/08/2022*   Flu Shot  09/01/2022*   Medicare Annual Wellness Visit  08/06/2023   DTaP/Tdap/Td vaccine (3 - Tdap) 01/19/2029   Pneumonia Vaccine  Completed   HPV Vaccine  Aged Out  *Topic was postponed. The date shown is not the original due date.    Advanced directives: no  Conditions/risks identified: none  Next appointment: Follow up in one year for your annual wellness visit. 08/12/23 @ 11:30 am by phone  Preventive Care 65 Years and Older, Male  Preventive care refers to lifestyle choices and visits with your health care provider that can promote health and wellness. What does preventive care include? A yearly physical exam. This is also called an annual well check. Dental exams once or twice a year. Routine eye exams. Ask your health care provider how often you should have your eyes checked. Personal lifestyle choices, including: Daily care of your teeth and gums. Regular physical activity. Eating a healthy diet. Avoiding tobacco and drug use. Limiting alcohol use. Practicing safe sex. Taking low doses of aspirin every day. Taking vitamin and mineral supplements as recommended by your health care provider. What happens during an annual well check? The services and screenings done by your health care  provider during your annual well check will depend on your age, overall health, lifestyle risk factors, and family history of disease. Counseling  Your health care provider may ask you questions about your: Alcohol use. Tobacco use. Drug use. Emotional well-being. Home and relationship well-being. Sexual activity. Eating habits. History of falls. Memory and ability to understand (cognition). Work and work Statistician. Screening  You may have the following tests or measurements: Height, weight, and BMI. Blood pressure. Lipid and cholesterol levels. These may be checked every 5 years, or more frequently if you are over 36 years old. Skin check. Lung cancer screening. You may have this screening every year starting at age 72 if you have a 30-pack-year history of smoking and currently smoke or have quit within the past 15 years. Fecal occult blood test (FOBT) of the stool. You may have this test every year starting at age 26. Flexible sigmoidoscopy or colonoscopy. You may have a sigmoidoscopy every 5 years or a colonoscopy every 10 years starting at age 8. Prostate cancer screening. Recommendations will vary depending on your family history and other risks. Hepatitis C blood test. Hepatitis B blood test. Sexually transmitted disease (STD) testing. Diabetes screening. This is done by checking your blood sugar (glucose) after you have not eaten for a while (fasting). You may have this done every 1-3 years. Abdominal aortic aneurysm (AAA) screening. You may need this if you are  a current or former smoker. Osteoporosis. You may be screened starting at age 105 if you are at high risk. Talk with your health care provider about your test results, treatment options, and if necessary, the need for more tests. Vaccines  Your health care provider may recommend certain vaccines, such as: Influenza vaccine. This is recommended every year. Tetanus, diphtheria, and acellular pertussis (Tdap, Td)  vaccine. You may need a Td booster every 10 years. Zoster vaccine. You may need this after age 71. Pneumococcal 13-valent conjugate (PCV13) vaccine. One dose is recommended after age 36. Pneumococcal polysaccharide (PPSV23) vaccine. One dose is recommended after age 45. Talk to your health care provider about which screenings and vaccines you need and how often you need them. This information is not intended to replace advice given to you by your health care provider. Make sure you discuss any questions you have with your health care provider. Document Released: 06/16/2015 Document Revised: 02/07/2016 Document Reviewed: 03/21/2015 Elsevier Interactive Patient Education  2017 [employer] Prevention in the Home Falls can cause injuries. They can happen to people of all ages. There are many things you can do to make your home safe and to help prevent falls. What can I do on the outside of my home? Regularly fix the edges of walkways and driveways and fix any cracks. Remove anything that might make you trip as you walk through a door, such as a raised step or threshold. Trim any bushes or trees on the path to your home. Use bright outdoor lighting. Clear any walking paths of anything that might make someone trip, such as rocks or tools. Regularly check to see if handrails are loose or broken. Make sure that both sides of any steps have handrails. Any raised decks and porches should have guardrails on the edges. Have any leaves, snow, or ice cleared regularly. Use sand or salt on walking paths during winter. Clean up any spills in your garage right away. This includes oil or grease spills. What can I do in the bathroom? Use night lights. Install grab bars by the toilet and in the tub and shower. Do not use towel bars as grab bars. Use non-skid mats or decals in the tub or shower. If you need to sit down in the shower, use a plastic, non-slip stool. Keep the floor dry. Clean up any  water that spills on the floor as soon as it happens. Remove soap buildup in the tub or shower regularly. Attach bath mats securely with double-sided non-slip rug tape. Do not have throw rugs and other things on the floor that can make you trip. What can I do in the bedroom? Use night lights. Make sure that you have a light by your bed that is easy to reach. Do not use any sheets or blankets that are too big for your bed. They should not hang down onto the floor. Have a firm chair that has side arms. You can use this for support while you get dressed. Do not have throw rugs and other things on the floor that can make you trip. What can I do in the kitchen? Clean up any spills right away. Avoid walking on wet floors. Keep items that you use a lot in easy-to-reach places. If you need to reach something above you, use a strong step stool that has a grab bar. Keep electrical cords out of the way. Do not use floor polish or wax that makes floors slippery. If you must  use wax, use non-skid floor wax. Do not have throw rugs and other things on the floor that can make you trip. What can I do with my stairs? Do not leave any items on the stairs. Make sure that there are handrails on both sides of the stairs and use them. Fix handrails that are broken or loose. Make sure that handrails are as long as the stairways. Check any carpeting to make sure that it is firmly attached to the stairs. Fix any carpet that is loose or worn. Avoid having throw rugs at the top or bottom of the stairs. If you do have throw rugs, attach them to the floor with carpet tape. Make sure that you have a light switch at the top of the stairs and the bottom of the stairs. If you do not have them, ask someone to add them for you. What else can I do to help prevent falls? Wear shoes that: Do not have high heels. Have rubber bottoms. Are comfortable and fit you well. Are closed at the toe. Do not wear sandals. If you use a  stepladder: Make sure that it is fully opened. Do not climb a closed stepladder. Make sure that both sides of the stepladder are locked into place. Ask someone to hold it for you, if possible. Clearly mark and make sure that you can see: Any grab bars or handrails. First and last steps. Where the edge of each step is. Use tools that help you move around (mobility aids) if they are needed. These include: Canes. Walkers. Scooters. Crutches. Turn on the lights when you go into a dark area. Replace any light bulbs as soon as they burn out. Set up your furniture so you have a clear path. Avoid moving your furniture around. If any of your floors are uneven, fix them. If there are any pets around you, be aware of where they are. Review your medicines with your doctor. Some medicines can make you feel dizzy. This can increase your chance of falling. Ask your doctor what other things that you can do to help prevent falls. This information is not intended to replace advice given to you by your health care provider. Make sure you discuss any questions you have with your health care provider. Document Released: 03/16/2009 Document Revised: 10/26/2015 Document Reviewed: 06/24/2014 Elsevier Interactive Patient Education  2017 [employer].

## 2022-09-16 ENCOUNTER — Ambulatory Visit (INDEPENDENT_AMBULATORY_CARE_PROVIDER_SITE_OTHER): Payer: Medicare Other

## 2022-09-16 DIAGNOSIS — I472 Ventricular tachycardia, unspecified: Secondary | ICD-10-CM | POA: Diagnosis not present

## 2022-09-17 LAB — CUP PACEART REMOTE DEVICE CHECK
Battery Remaining Longevity: 22 mo
Battery Voltage: 2.92 V
Brady Statistic AP VP Percent: 90.14 %
Brady Statistic AP VS Percent: 0.01 %
Brady Statistic AS VP Percent: 9.83 %
Brady Statistic AS VS Percent: 0.02 %
Brady Statistic RA Percent Paced: 90.07 %
Brady Statistic RV Percent Paced: 99.88 %
Date Time Interrogation Session: 20240415033426
HighPow Impedance: 65 Ohm
Implantable Lead Connection Status: 753985
Implantable Lead Connection Status: 753985
Implantable Lead Implant Date: 20171025
Implantable Lead Implant Date: 20171025
Implantable Lead Location: 753859
Implantable Lead Location: 753860
Implantable Lead Model: 5076
Implantable Pulse Generator Implant Date: 20171025
Lead Channel Impedance Value: 342 Ohm
Lead Channel Impedance Value: 361 Ohm
Lead Channel Impedance Value: 399 Ohm
Lead Channel Pacing Threshold Amplitude: 0.625 V
Lead Channel Pacing Threshold Amplitude: 0.625 V
Lead Channel Pacing Threshold Pulse Width: 0.4 ms
Lead Channel Pacing Threshold Pulse Width: 0.4 ms
Lead Channel Sensing Intrinsic Amplitude: 1.25 mV
Lead Channel Sensing Intrinsic Amplitude: 1.25 mV
Lead Channel Sensing Intrinsic Amplitude: 11.25 mV
Lead Channel Sensing Intrinsic Amplitude: 11.25 mV
Lead Channel Setting Pacing Amplitude: 2 V
Lead Channel Setting Pacing Amplitude: 2.5 V
Lead Channel Setting Pacing Pulse Width: 0.4 ms
Lead Channel Setting Sensing Sensitivity: 0.3 mV
Zone Setting Status: 755011

## 2022-10-23 NOTE — Progress Notes (Signed)
Remote ICD transmission.   

## 2022-11-30 NOTE — Patient Instructions (Signed)
Heart Failure Eating Plan Heart failure, also called congestive heart failure, occurs when your heart does not pump blood well enough to meet your body's needs for oxygen-rich blood. Heart failure is a long-term (chronic) condition. Living with heart failure can be challenging. Following your health care provider's instructions about a healthy lifestyle and working with a dietitian to choose the right foods may help to improve your symptoms. An eating plan for someone with heart failure will include changes that limit the intake of salt (sodium) and unhealthy fat. What are tips for following this plan? Reading food labels Check food labels for the amount of sodium per serving. Choose foods that have less than 140 mg (milligrams) of sodium in each serving. Check food labels for the number of calories per serving. This is important if you need to limit your daily calorie intake to lose weight. Check food labels for the serving size. If you eat more than one serving, you will be eating more sodium and calories than what is listed on the label. Look for foods that are labeled as "sodium-free," "very low sodium," or "low sodium." Foods labeled as "reduced sodium" or "lightly salted" may still have more sodium than what is recommended for you. Cooking Avoid adding salt when cooking. Ask your health care provider or dietitian before using salt substitutes. Season food with salt-free seasonings, spices, or herbs. Check the label of seasoning mixes to make sure they do not contain salt. Cook with heart-healthy oils, such as olive, canola, soybean, or sunflower oil. Do not fry foods. Cook foods using low-fat methods, such as baking, boiling, grilling, and broiling. Limit unhealthy fats when cooking by: Removing the skin from poultry, such as chicken. Removing all visible fats from meats. Skimming the fat off from stews, soups, and gravies before serving them. Meal planning  Limit your intake  of: Processed, canned, or prepackaged foods. Foods that are high in trans fat, such as fried foods. Sweets, desserts, sugary drinks, and other foods with added sugar. Full-fat dairy products, such as whole milk. Eat a balanced diet. This may include: 4-5 servings of fruit each day and 4-5 servings of vegetables each day. At each meal, try to fill one-half of your plate with fruits and vegetables. Up to 6-8 servings of whole grains each day. Up to 2 servings of lean meat, poultry, or fish each day. One serving of meat is equal to 3 oz (85 g). This is about the same size as a deck of cards. 2 servings of low-fat dairy each day. Heart-healthy fats. Healthy fats called omega-3 fatty acids are found in foods such as flaxseed and cold-water fish like sardines, salmon, and mackerel. Aim to eat 25-35 g (grams) of fiber a day. Foods that are high in fiber include apples, broccoli, carrots, beans, peas, and whole grains. Do not add salt or condiments that contain salt (such as soy sauce) to foods before eating. When eating at a restaurant, ask that your food be prepared with less salt or no salt, if possible. Try to eat 2 or more vegetarian meals each week. Eat more home-cooked food and eat less restaurant, buffet, and fast food. General information Do not eat more than 2,300 mg of sodium a day. The amount of sodium that is recommended for you may be lower, depending on your condition. Maintain a healthy body weight as directed. Ask your health care provider what a healthy weight is for you. Check your weight every day. Work with your health care provider   and dietitian to make a plan that is right for you to lose weight or maintain your current weight. Limit how much fluid you drink. Ask your health care provider or dietitian how much fluid you can have each day. Limit or avoid alcohol as told by your health care provider or dietitian. Recommended foods Fruits All fresh, frozen, and canned fruits.  Dried fruits, such as raisins, prunes, and cranberries. Vegetables All fresh vegetables. Vegetables that are frozen without sauce or added salt. Low-sodium or sodium-free canned vegetables. Grains Bread with less than 80 mg of sodium per slice. Whole-wheat pasta, quinoa, and brown rice. Oats and oatmeal. Barley. Millet. Grits and cream of wheat. Whole-grain and whole-wheat cold cereal. Meats and other protein foods Lean cuts of meat. Skinless chicken and turkey. Fish with high omega-3 fatty acids, such as salmon, sardines, and other cold-water fishes. Eggs. Dried beans, peas, and edamame. Unsalted nuts and nut butters. Dairy Low-fat or nonfat (skim) milk and dried milk. Rice milk, soy milk, and almond milk. Low-fat or nonfat yogurt. Small amounts of reduced-sodium block cheese. Low-sodium cottage cheese. Fats and oils Olive, canola, soybean, flaxseed, avocado, or sunflower oil. Sweets and desserts Applesauce. Granola bars. Sugar-free pudding and gelatin. Frozen fruit bars. Seasoning and other foods Fresh and dried herbs. Lemon or lime juice. Vinegar. Low-sodium ketchup. Salt-free marinades, salad dressings, sauces, and seasonings. The items listed above may not be a complete list of foods and beverages you can eat. Contact a dietitian for more information. Foods to avoid Fruits Fruits that are dried with sodium-containing preservatives. Vegetables Canned vegetables. Frozen vegetables with sauce or seasonings. Creamed vegetables. French fries. Onion rings. Pickled vegetables and sauerkraut. Grains Bread with more than 80 mg of sodium per slice. Hot or cold cereal with more than 140 mg sodium per serving. Salted pretzels and crackers. Prepackaged breadcrumbs. Bagels, croissants, and biscuits. Meats and other protein foods Ribs and chicken wings. Bacon, ham, pepperoni, bologna, salami, and packaged luncheon meats. Hot dogs, bratwurst, and sausage. Canned meat. Smoked meat and fish. Salted nuts  and seeds. Dairy Whole milk, half-and-half, and cream. Buttermilk. Processed cheese, cheese spreads, and cheese curds. Regular cottage cheese. Feta cheese. Shredded cheese. String cheese. Fats and oils Butter, lard, shortening, ghee, and bacon fat. Canned and packaged gravies. Seasoning and other foods Onion salt, garlic salt, table salt, and sea salt. Marinades. Regular salad dressings. Relishes, pickles, and olives. Meat flavorings and tenderizers, and bouillon cubes. Horseradish, ketchup, and mustard. Worcestershire sauce. Teriyaki sauce, soy sauce (including reduced sodium). Hot sauce and Tabasco sauce. Steak sauce, fish sauce, oyster sauce, and cocktail sauce. Taco seasonings. Barbecue sauce. Tartar sauce. The items listed above may not be a complete list of foods and beverages you should avoid. Contact a dietitian for more information. Summary A heart failure eating plan includes changes that limit your intake of sodium and unhealthy fat, and it may help you lose weight or maintain a healthy weight. Your health care provider may also recommend limiting how much fluid you drink. Most people with heart failure should eat no more than 2,300 mg of salt (sodium) a day. The amount of sodium that is recommended for you may be lower, depending on your condition. Contact your health care provider or dietitian before making any major changes to your diet. This information is not intended to replace advice given to you by your health care provider. Make sure you discuss any questions you have with your health care provider. Document Revised: 08/28/2021 Document Reviewed: 01/03/2020   Elsevier Patient Education  2024 [employer]  

## 2022-12-04 ENCOUNTER — Ambulatory Visit: Payer: Medicare Other | Admitting: Nurse Practitioner

## 2022-12-04 ENCOUNTER — Encounter: Payer: Self-pay | Admitting: Nurse Practitioner

## 2022-12-04 VITALS — BP 126/70 | HR 73 | Temp 97.6°F | Wt 178.4 lb

## 2022-12-04 DIAGNOSIS — I130 Hypertensive heart and chronic kidney disease with heart failure and stage 1 through stage 4 chronic kidney disease, or unspecified chronic kidney disease: Secondary | ICD-10-CM | POA: Diagnosis not present

## 2022-12-04 DIAGNOSIS — I38 Endocarditis, valve unspecified: Secondary | ICD-10-CM

## 2022-12-04 DIAGNOSIS — R7301 Impaired fasting glucose: Secondary | ICD-10-CM

## 2022-12-04 DIAGNOSIS — E782 Mixed hyperlipidemia: Secondary | ICD-10-CM

## 2022-12-04 DIAGNOSIS — I7123 Aneurysm of the descending thoracic aorta, without rupture: Secondary | ICD-10-CM

## 2022-12-04 DIAGNOSIS — N1832 Chronic kidney disease, stage 3b: Secondary | ICD-10-CM | POA: Diagnosis not present

## 2022-12-04 DIAGNOSIS — N183 Chronic kidney disease, stage 3 unspecified: Secondary | ICD-10-CM

## 2022-12-04 DIAGNOSIS — I5022 Chronic systolic (congestive) heart failure: Secondary | ICD-10-CM

## 2022-12-04 DIAGNOSIS — Z95810 Presence of automatic (implantable) cardiac defibrillator: Secondary | ICD-10-CM

## 2022-12-04 DIAGNOSIS — I428 Other cardiomyopathies: Secondary | ICD-10-CM | POA: Diagnosis not present

## 2022-12-04 DIAGNOSIS — I472 Ventricular tachycardia, unspecified: Secondary | ICD-10-CM

## 2022-12-04 DIAGNOSIS — I2510 Atherosclerotic heart disease of native coronary artery without angina pectoris: Secondary | ICD-10-CM

## 2022-12-04 LAB — MICROALBUMIN, URINE WAIVED
Creatinine, Urine Waived: 100 mg/dL (ref 10–300)
Microalb, Ur Waived: 30 mg/L — ABNORMAL HIGH (ref 0–19)
Microalb/Creat Ratio: <30 mg/g (ref <30)

## 2022-12-04 NOTE — Assessment & Plan Note (Signed)
A1c last check 6.2%.  Continue diet focus at home.  Recheck today.  Goal <8% due to age.

## 2022-12-04 NOTE — Assessment & Plan Note (Signed)
Continue collaboration with cardiology for checks. °

## 2022-12-04 NOTE — Assessment & Plan Note (Signed)
Chronic, no statin use, refuses.  With advanced age this is appropriate, will continue to monitor and focus on diet. 

## 2022-12-04 NOTE — Assessment & Plan Note (Signed)
Followed by cardiology, continue this collaboration. ?

## 2022-12-04 NOTE — Progress Notes (Signed)
BP 126/70   Pulse 73   Temp 97.6 F (36.4 C) (Oral)   Wt 178 lb 6.4 oz (80.9 kg)   SpO2 96%   BMI 24.88 kg/m    Subjective:    Patient ID: Roy Palmer, male    DOB: 13-Jul-1929, 89 y.o.   MRN: [id number]  HPI: Roy Palmer is a 89 y.o. male  Chief Complaint  Patient presents with   Chronic Kidney Disease   Hyperlipidemia   Hypertension   HYPERTENSION / HYPERLIPIDEMIA WITH CHF Last saw cardiology on 06/12/22 with no changes.  09/06/21 EF was 35-40%. History of v tach and has ICD in place -- gets checks with cardiology.  Is intolerant to all classes HF therapy.  At this time is on Pacerone + ASA.    Known thoracic aortic aneurysm noted on 2017 CT measuring 3.5 AP at that time. Discussed risks and benefits of low dose CT for monitoring- does not wish to obtain at this time, last check was 10/22/19 - 4 cm. Not taking statin, does not wish to.   Has right-sided Bell's palsy on 02/22/20, follows with eye doctor regularly. Satisfied with current treatment? yes Duration of hypertension: chronic BP monitoring frequency: once a month BP range: 110/70 range, sometimes a little lower BP medication side effects: no Duration of hyperlipidemia: chronic Cholesterol medication side effects: no Cholesterol supplements: none Aspirin: yes Recent stressors: no Recurrent headaches: no Visual changes: no Palpitations: no Dyspnea: no Chest pain: no Lower extremity edema: no Dizzy/lightheaded: no  The ASCVD Risk score (Arnett DK, et al., 2019) failed to calculate for the following reasons:   The 2019 ASCVD risk score is only valid for ages 36 to 41  CHRONIC KIDNEY DISEASE III Has been seen by nephrology in past per report -- to return as needed.  He wishes to have labs with PCP only at this time.  Following A1c with last December 2023 = 6.2%. CKD status: stable Medications renally dose: no Previous renal evaluation: yes Pneumovax:  Up to Date Influenza Vaccine:  Up to Date     Relevant past medical, surgical, family and social history reviewed and updated as indicated. Interim medical history since our last visit reviewed. Allergies and medications reviewed and updated.  Review of Systems  Constitutional:  Negative for activity change, diaphoresis, fatigue and fever.  Respiratory:  Negative for cough, chest tightness, shortness of breath and wheezing.   Cardiovascular:  Negative for chest pain, palpitations and leg swelling.  Gastrointestinal: Negative.   Neurological: Negative.   Psychiatric/Behavioral: Negative.     Per HPI unless specifically indicated above     Objective:    BP 126/70   Pulse 73   Temp 97.6 F (36.4 C) (Oral)   Wt 178 lb 6.4 oz (80.9 kg)   SpO2 96%   BMI 24.88 kg/m   Wt Readings from Last 3 Encounters:  12/04/22 178 lb 6.4 oz (80.9 kg)  08/06/22 180 lb (81.6 kg)  06/12/22 180 lb 12.8 oz (82 kg)    Physical Exam Vitals and nursing note reviewed.  Constitutional:      General: He is awake. He is not in acute distress.    Appearance: He is well-developed and well-groomed. He is not ill-appearing or toxic-appearing.  HENT:     Head: Normocephalic and atraumatic.     Right Ear: Hearing and external ear normal. No drainage.     Left Ear: Hearing and external ear normal. No drainage.  Eyes:  General: Lids are normal.        Right eye: No discharge.        Left eye: No discharge.     Conjunctiva/sclera: Conjunctivae normal.     Pupils: Pupils are equal, round, and reactive to light.  Neck:     Thyroid: No thyromegaly.     Vascular: No carotid bruit.  Cardiovascular:     Rate and Rhythm: Normal rate and regular rhythm.     Heart sounds: Normal heart sounds, S1 normal and S2 normal. No murmur heard.    No gallop.  Pulmonary:     Effort: Pulmonary effort is normal. No accessory muscle usage or respiratory distress.     Breath sounds: Normal breath sounds.  Abdominal:     General: Bowel sounds are normal. There is no  distension.     Palpations: Abdomen is soft.     Tenderness: There is no abdominal tenderness.  Musculoskeletal:        General: Normal range of motion.     Cervical back: Normal range of motion and neck supple.     Right lower leg: No edema.     Left lower leg: No edema.  Lymphadenopathy:     Cervical: No cervical adenopathy.  Skin:    General: Skin is warm and dry.  Neurological:     Mental Status: He is alert and oriented to person, place, and time.  Psychiatric:        Attention and Perception: Attention normal.        Mood and Affect: Mood normal.        Speech: Speech normal.        Behavior: Behavior normal. Behavior is cooperative.        Thought Content: Thought content normal.        Judgment: Judgment normal.    Results for orders placed or performed in visit on 09/16/22  CUP PACEART REMOTE DEVICE CHECK  Result Value Ref Range   Date Time Interrogation Session [id number]    Pulse Generator Manufacturer MERM    Pulse Gen Model [id number] Evera MRI XT DR    Pulse Gen Serial Number [id number]    Clinic Name [facility]    Implantable Pulse Generator Type Implantable Cardiac Defibulator    Implantable Pulse Generator Implant Date [id number]    Implantable Lead Manufacturer MERM    Implantable Lead Model 5076 CapSureFix Novus MRI SureScan    Implantable Lead Serial Number [id number]    Implantable Lead Implant Date [id number]    Implantable Lead Location Detail 1 APPENDAGE    Implantable Lead Location [id number]    Implantable Lead Connection Status [id number]    Implantable Lead Manufacturer [facility]    Implantable Lead Model 309-573-2048 Sprint Quattro Secure S MRI SureScan    Implantable Lead Serial Number [id number]    Implantable Lead Implant Date [id number]    Implantable Lead Location Detail 1 APEX    Implantable Lead Location [id number]    Implantable Lead Connection Status [id number]    Lead Channel Setting Sensing Sensitivity 0.3 mV   Lead Channel Setting Pacing Amplitude 2 V    Lead Channel Setting Pacing Pulse Width 0.4 ms   Lead Channel Setting Pacing Amplitude 2.5 V   Zone Setting Status Active    Zone Setting Status Inactive    Zone Setting Status Active    Zone Setting Status Inactive    Zone Setting Status [id number]    Lead Channel Impedance Value 361 ohm  Lead Channel Sensing Intrinsic Amplitude 1.25 mV   Lead Channel Sensing Intrinsic Amplitude 1.25 mV   Lead Channel Pacing Threshold Amplitude 0.625 V   Lead Channel Pacing Threshold Pulse Width 0.4 ms   Lead Channel Impedance Value 399 ohm   Lead Channel Impedance Value 342 ohm   Lead Channel Sensing Intrinsic Amplitude 11.25 mV   Lead Channel Sensing Intrinsic Amplitude 11.25 mV   Lead Channel Pacing Threshold Amplitude 0.625 V   Lead Channel Pacing Threshold Pulse Width 0.4 ms   HighPow Impedance 65 ohm   Battery Status OK    Battery Remaining Longevity 22 mo   Battery Voltage 2.92 V   Brady Statistic RA Percent Paced 90.07 %   Brady Statistic RV Percent Paced 99.88 %   Brady Statistic AP VP Percent 90.14 %   Brady Statistic AS VP Percent 9.83 %   Brady Statistic AP VS Percent 0.01 %   Brady Statistic AS VS Percent 0.02 %      Assessment & Plan:   Problem List Items Addressed This Visit       Cardiovascular and Mediastinum   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  Continue collaboration with cardiology and reviewed recommendations. Euvolemic on exam.  Recent EF 09/06/21 was 35-40%. - Reminded to call for an overnight weight gain of >2 pounds or a weekly weight weight of >5 pounds - not adding salt to his food and has been reading food labels. Reviewed the importance of keeping daily sodium intake to 2000mg  daily  - Avoid Ibuprofen products      Relevant Orders   TSH   Coronary artery disease involving native coronary artery of native heart without angina pectoris    Chronic, stable.  Denies any recent CP.  Continue collaboration with cardiology  and current medication regimen.      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  BP remains below goal for age.  Continue current medication regimen and collaboration with cardiology. Recommend he monitor BP at least a few mornings a week at home and document.  DASH diet at home.  Continue current medication regimen and adjust as needed.  Labs today: CBC, CMP, TSH, urine ALB.  Return in 6 months per patient preference.       Relevant Orders   CBC with Differential/Platelet   Nonischemic cardiomyopathy (HCC)    Followed by cardiology, continue this collaboration.       Thoracic aortic aneurysm (HCC)    Ongoing and stable on imaging 2021.  Discussed need for repeat imaging with him, he declines at this time.  Continue good BP control.  Refuses statin.        Endocrine   IFG (impaired fasting glucose)    A1c last check 6.2%.  Continue diet focus at home.  Recheck today.  Goal <8% due to age.      Relevant Orders   HgB A1c     Genitourinary   CKD (chronic kidney disease), stage IIIa - Primary    Chronic, ongoing.  Continue to monitor, labs obtained today.  Return to nephrology as needed in future.      Relevant Orders   Microalbumin, Urine Waived   Comprehensive metabolic panel   CBC with Differential/Platelet     Other   Hyperlipidemia    Chronic, no statin use, refuses.  With advanced age this is appropriate, will continue to  monitor and focus on diet.      Relevant Orders   Comprehensive metabolic panel   Lipid Panel w/o Chol/HDL Ratio   ICD (implantable cardioverter-defibrillator) in place    Continue collaboration with cardiology for checks.        Follow up plan: Return in about 6 months (around 06/06/2023) for HTN/HLD/HF, CKD, A1c check.

## 2022-12-04 NOTE — Assessment & Plan Note (Signed)
Ongoing and stable on imaging 2021.  Discussed need for repeat imaging with him, he declines at this time.  Continue good BP control.  Refuses statin. 

## 2022-12-04 NOTE — Assessment & Plan Note (Signed)
Chronic, ongoing.  Continue to monitor, labs obtained today.  Return to nephrology as needed in future.

## 2022-12-04 NOTE — Assessment & Plan Note (Signed)
Chronic, ongoing.  Continue collaboration with cardiology and reviewed recommendations. Euvolemic on exam.  Recent EF 09/06/21 was 35-40%. - Reminded to call for an overnight weight gain of >2 pounds or a weekly weight weight of >5 pounds - not adding salt to his food and has been reading food labels. Reviewed the importance of keeping daily sodium intake to <2000mg daily  - Avoid Ibuprofen products 

## 2022-12-04 NOTE — Assessment & Plan Note (Signed)
Chronic, stable.  Denies any recent CP.  Continue collaboration with cardiology and current medication regimen.

## 2022-12-04 NOTE — Assessment & Plan Note (Signed)
Chronic, ongoing.  BP remains below goal for age.  Continue current medication regimen and collaboration with cardiology. Recommend he monitor BP at least a few mornings a week at home and document.  DASH diet at home.  Continue current medication regimen and adjust as needed.  Labs today: CBC, CMP, TSH, urine ALB.  Return in 6 months per patient preference.

## 2022-12-05 LAB — CBC WITH DIFFERENTIAL/PLATELET
Basophils Absolute: 0 10*3/uL (ref 0.0–0.2)
Basos: 1 %
EOS (ABSOLUTE): 0.2 10*3/uL (ref 0.0–0.4)
Eos: 3 %
Hematocrit: 42.2 % (ref 37.5–51.0)
Hemoglobin: 13.8 g/dL (ref 13.0–17.7)
Immature Grans (Abs): 0 10*3/uL (ref 0.0–0.1)
Immature Granulocytes: 0 %
Lymphocytes Absolute: 1.4 10*3/uL (ref 0.7–3.1)
Lymphs: 19 %
MCH: 32.2 pg (ref 26.6–33.0)
MCHC: 32.7 g/dL (ref 31.5–35.7)
MCV: 99 fL — ABNORMAL HIGH (ref 79–97)
Monocytes Absolute: 0.6 10*3/uL (ref 0.1–0.9)
Monocytes: 7 %
Neutrophils Absolute: 5.3 10*3/uL (ref 1.4–7.0)
Neutrophils: 70 %
Platelets: 151 10*3/uL (ref 150–450)
RBC: 4.28 x10E6/uL (ref 4.14–5.80)
RDW: 13.1 % (ref 11.6–15.4)
WBC: 7.5 10*3/uL (ref 3.4–10.8)

## 2022-12-05 LAB — COMPREHENSIVE METABOLIC PANEL
ALT: 17 IU/L (ref 0–44)
AST: 16 IU/L (ref 0–40)
Albumin: 4.2 g/dL (ref 3.6–4.6)
Alkaline Phosphatase: 65 IU/L (ref 44–121)
BUN/Creatinine Ratio: 20 (ref 10–24)
BUN: 31 mg/dL (ref 10–36)
Bilirubin Total: 0.4 mg/dL (ref 0.0–1.2)
CO2: 20 mmol/L (ref 20–29)
Calcium: 9 mg/dL (ref 8.6–10.2)
Chloride: 101 mmol/L (ref 96–106)
Creatinine, Ser: 1.55 mg/dL — ABNORMAL HIGH (ref 0.76–1.27)
Globulin, Total: 1.9 g/dL (ref 1.5–4.5)
Glucose: 105 mg/dL — ABNORMAL HIGH (ref 70–99)
Potassium: 4.7 mmol/L (ref 3.5–5.2)
Sodium: 137 mmol/L (ref 134–144)
Total Protein: 6.1 g/dL (ref 6.0–8.5)
eGFR: 42 mL/min/{1.73_m2} — ABNORMAL LOW (ref >59)

## 2022-12-05 LAB — LIPID PANEL W/O CHOL/HDL RATIO
Cholesterol, Total: 197 mg/dL (ref 100–199)
HDL: 45 mg/dL (ref >39)
LDL Chol Calc (NIH): 109 mg/dL — ABNORMAL HIGH (ref 0–99)
Triglycerides: 247 mg/dL — ABNORMAL HIGH (ref 0–149)
VLDL Cholesterol Cal: 43 mg/dL — ABNORMAL HIGH (ref 5–40)

## 2022-12-05 LAB — TSH: TSH: 1.04 u[IU]/mL (ref 0.450–4.500)

## 2022-12-05 LAB — HEMOGLOBIN A1C
Est. average glucose Bld gHb Est-mCnc: 123 mg/dL
Hgb A1c MFr Bld: 5.9 % — ABNORMAL HIGH (ref 4.8–5.6)

## 2022-12-05 NOTE — Progress Notes (Signed)
Contacted via MyChart -- but do not believe the checks this on review, so please call: Good morning Roy Palmer, your labs have returned: - Kidney function continues to show Stage 3b kidney disease with no worsening.  We will continue to monitor closely at visit and ensure good water intake daily.  Liver function is normal. - CBC remains stable with no anemia or infection.  Thyroid is also normal. - A1c continues to show prediabetes, but level improved this check. Continue diet focus. - Cholesterol levels remain elevated, continue diet focus at home.  Any questions? Keep being amazing!!  Thank you for allowing me to participate in your care.  I appreciate you. Kindest regards, Lariya Kinzie

## 2022-12-09 ENCOUNTER — Telehealth: Payer: Self-pay | Admitting: Nurse Practitioner

## 2022-12-09 NOTE — Telephone Encounter (Signed)
Copied from CRM [id number]. Topic: General - Call Back - No Documentation >> Dec 09, 2022  3:28 PM Ja-Kwan M wrote: Reason for CRM: Pt stated he had a message to call Jolene back. Cb# [phone]

## 2022-12-09 NOTE — Telephone Encounter (Signed)
Patient was called and given lab results. 

## 2022-12-16 ENCOUNTER — Ambulatory Visit: Payer: Medicare Other

## 2022-12-16 DIAGNOSIS — I472 Ventricular tachycardia, unspecified: Secondary | ICD-10-CM

## 2022-12-16 DIAGNOSIS — I428 Other cardiomyopathies: Secondary | ICD-10-CM

## 2022-12-17 LAB — CUP PACEART REMOTE DEVICE CHECK
Battery Remaining Longevity: 18 mo
Battery Voltage: 2.9 V
Brady Statistic AP VP Percent: 93.22 %
Brady Statistic AP VS Percent: 0.04 %
Brady Statistic AS VP Percent: 6.71 %
Brady Statistic AS VS Percent: 0.03 %
Brady Statistic RA Percent Paced: 93.02 %
Brady Statistic RV Percent Paced: 99.64 %
Date Time Interrogation Session: 20240715184242
HighPow Impedance: 71 Ohm
Implantable Lead Connection Status: 753985
Implantable Lead Connection Status: 753985
Implantable Lead Implant Date: 20171025
Implantable Lead Implant Date: 20171025
Implantable Lead Location: 753859
Implantable Lead Location: 753860
Implantable Lead Model: 5076
Implantable Pulse Generator Implant Date: 20171025
Lead Channel Impedance Value: 342 Ohm
Lead Channel Impedance Value: 361 Ohm
Lead Channel Impedance Value: 418 Ohm
Lead Channel Pacing Threshold Amplitude: 0.625 V
Lead Channel Pacing Threshold Amplitude: 0.625 V
Lead Channel Pacing Threshold Pulse Width: 0.4 ms
Lead Channel Pacing Threshold Pulse Width: 0.4 ms
Lead Channel Sensing Intrinsic Amplitude: 1.375 mV
Lead Channel Sensing Intrinsic Amplitude: 1.375 mV
Lead Channel Sensing Intrinsic Amplitude: 11.25 mV
Lead Channel Sensing Intrinsic Amplitude: 11.25 mV
Lead Channel Setting Pacing Amplitude: 2 V
Lead Channel Setting Pacing Amplitude: 2.5 V
Lead Channel Setting Pacing Pulse Width: 0.4 ms
Lead Channel Setting Sensing Sensitivity: 0.3 mV
Zone Setting Status: 755011

## 2022-12-30 NOTE — Progress Notes (Signed)
Remote ICD transmission.   

## 2023-03-11 ENCOUNTER — Other Ambulatory Visit: Payer: Self-pay | Admitting: Medical

## 2023-03-11 DIAGNOSIS — I5022 Chronic systolic (congestive) heart failure: Secondary | ICD-10-CM

## 2023-03-11 DIAGNOSIS — Z95810 Presence of automatic (implantable) cardiac defibrillator: Secondary | ICD-10-CM

## 2023-03-11 DIAGNOSIS — I472 Ventricular tachycardia, unspecified: Secondary | ICD-10-CM

## 2023-03-11 DIAGNOSIS — I428 Other cardiomyopathies: Secondary | ICD-10-CM

## 2023-03-12 NOTE — Telephone Encounter (Signed)
last EP visit with Dr. Ladona Ridgel on 11/19/21 with plan to f/u in 12 months, please schedule.

## 2023-03-17 ENCOUNTER — Ambulatory Visit (INDEPENDENT_AMBULATORY_CARE_PROVIDER_SITE_OTHER): Payer: Medicare Other

## 2023-03-17 DIAGNOSIS — I472 Ventricular tachycardia, unspecified: Secondary | ICD-10-CM

## 2023-03-17 DIAGNOSIS — I5022 Chronic systolic (congestive) heart failure: Secondary | ICD-10-CM

## 2023-03-19 LAB — CUP PACEART REMOTE DEVICE CHECK
Battery Remaining Longevity: 16 mo
Battery Voltage: 2.9 V
Brady Statistic AP VP Percent: 92.17 %
Brady Statistic AP VS Percent: 0.09 %
Brady Statistic AS VP Percent: 7.67 %
Brady Statistic AS VS Percent: 0.07 %
Brady Statistic RA Percent Paced: 92.02 %
Brady Statistic RV Percent Paced: 99.6 %
Date Time Interrogation Session: 20241014033325
HighPow Impedance: 71 Ohm
Implantable Lead Connection Status: 753985
Implantable Lead Connection Status: 753985
Implantable Lead Implant Date: 20171025
Implantable Lead Implant Date: 20171025
Implantable Lead Location: 753859
Implantable Lead Location: 753860
Implantable Lead Model: 5076
Implantable Pulse Generator Implant Date: 20171025
Lead Channel Impedance Value: 342 Ohm
Lead Channel Impedance Value: 342 Ohm
Lead Channel Impedance Value: 418 Ohm
Lead Channel Pacing Threshold Amplitude: 0.625 V
Lead Channel Pacing Threshold Amplitude: 0.625 V
Lead Channel Pacing Threshold Pulse Width: 0.4 ms
Lead Channel Pacing Threshold Pulse Width: 0.4 ms
Lead Channel Sensing Intrinsic Amplitude: 1.375 mV
Lead Channel Sensing Intrinsic Amplitude: 1.375 mV
Lead Channel Sensing Intrinsic Amplitude: 11.625 mV
Lead Channel Sensing Intrinsic Amplitude: 11.625 mV
Lead Channel Setting Pacing Amplitude: 2 V
Lead Channel Setting Pacing Amplitude: 2.5 V
Lead Channel Setting Pacing Pulse Width: 0.4 ms
Lead Channel Setting Sensing Sensitivity: 0.3 mV
Zone Setting Status: 755011

## 2023-04-02 NOTE — Progress Notes (Signed)
Remote ICD transmission.   

## 2023-04-07 ENCOUNTER — Ambulatory Visit: Payer: Medicare Other | Attending: Internal Medicine | Admitting: Internal Medicine

## 2023-04-07 ENCOUNTER — Encounter: Payer: Self-pay | Admitting: Internal Medicine

## 2023-04-07 VITALS — BP 130/66 | HR 76 | Ht 71.0 in | Wt 175.2 lb

## 2023-04-07 DIAGNOSIS — I472 Ventricular tachycardia, unspecified: Secondary | ICD-10-CM | POA: Diagnosis not present

## 2023-04-07 LAB — CUP PACEART INCLINIC DEVICE CHECK
Date Time Interrogation Session: 20241104200428
Implantable Lead Connection Status: 753985
Implantable Lead Connection Status: 753985
Implantable Lead Implant Date: 20171025
Implantable Lead Implant Date: 20171025
Implantable Lead Location: 753859
Implantable Lead Location: 753860
Implantable Lead Model: 5076
Implantable Pulse Generator Implant Date: 20171025

## 2023-04-07 NOTE — Progress Notes (Signed)
HPI Roy Palmer returns today for followup. He has a h/o MR, s/p repair, non-ischemic CM, and CHB. He has VT and we had a difficult time controlling until we got him on amiodarone. He has done well in the interim. A repeat echo at that time demonstrated improved LV function. He has not had any ICD therapies. He is active fishing and hunting.  Allergies  Allergen Reactions   Amiodarone Other (See Comments)    Terrible dreams/nightmares in high dosing   Lyrica [Pregabalin] Nausea And Vomiting     Current Outpatient Medications  Medication Sig Dispense Refill   acetaminophen (TYLENOL) 325 MG tablet Take 325-650 mg by mouth every 6 (six) hours as needed for mild pain, moderate pain or fever.      amiodarone (PACERONE) 200 MG tablet Take 1 tablet (200 mg total) by mouth See admin instructions. TAKE 1 TABLET BY MOUTH EVERY DAY MONDAY THROUGH SATURDAY. DO NOT TAKE ON SUNDAY--overdue follow up visit with Dr. Ladona Ridgel.  PLEASE CALL OFFICE TO SCHEDULE APPOINTMENT PRIOR TO NEXT REFILL (first attempt) 30 tablet 0   Multiple Vitamins-Minerals (MULTIVITAMIN ADULTS 50+ PO) Take 1 tablet by mouth daily.     No current facility-administered medications for this visit.     Past Medical History:  Diagnosis Date   AAA (abdominal aortic aneurysm) without rupture (HCC) 03/12/2016   Small - measured 3.3 x 3.5 cm by CTA   CKD (chronic kidney disease) stage 3, GFR 30-59 ml/min (HCC)    Essential hypertension    Hyperlipidemia    Hypertensive kidney disease    Incidental pulmonary nodule 03/12/2016   Vague opacity right lung base requires radiographic follow up - index of suspicion is LOW   Near syncope    Cardiogenic, related to an SVT and severe MR   Non-sustained ventricular tachycardia (HCC)    S/P minimally invasive mitral valve repair 03/19/2016   Complex valvuloplasty including triangular resection of flail segment of posterior leaflet, chordal transposition x1, artificial Gore-tex neochord  placement x4 and 34 mm Sorin Memo 3D ring annuloplasty via right mini thoracotomy approach with clipping of LA appendage   Severe mitral regurgitation by prior echocardiogram 02/23/2016   Confirmed by TEE   Syncope 03/15/2016   Tennis elbow left   Thoracic aortic aneurysm (HCC) 03/12/2016   Very very mild fusiform enlargement of distal transverse and proximal descending thoracic aorta noted on CTA   Trigeminal neuralgia     ROS:   All systems reviewed and negative except as noted in the HPI.   Past Surgical History:  Procedure Laterality Date   CARDIAC CATHETERIZATION N/A 02/23/2016   Procedure: Right/Left Heart Cath and Coronary Angiography;  Surgeon: [name], MD;  Location: [facility] INVASIVE CV LAB;  Service: Cardiovascular: Angiographic minimal coronary disease. Normal left and right heart Pressures. Decreased CO/CI in settng of frequent PVCs & Severe MR.   CATARACT EXTRACTION, BILATERAL     COLONOSCOPY  08/2005   CYST EXCISION  10/2013   from neck   EP IMPLANTABLE DEVICE N/A 03/27/2016   Procedure: ICD Implant Dual Chamber;  Surgeon: Marinus Maw, MD;  Location: [facility] INVASIVE CV LAB;  Service: Cardiovascular;  Laterality: N/A;   ESOPHAGOGASTRODUODENOSCOPY  02/2010   MITRAL VALVE REPAIR Right 03/19/2016   Procedure: MINIMALLY INVASIVE MITRAL VALVE REPAIR (MVR);  Surgeon: [name], MD;  Location: [facility] OR;  Service: Open Heart Surgery;  Laterality: Right;   TEE WITHOUT CARDIOVERSION N/A 02/23/2016   Procedure: TRANSESOPHAGEAL ECHOCARDIOGRAM (  TEE);  Surgeon: Quintella Reichert, MD;  Location: [facility] ENDOSCOPY;  Service: Cardiovascular: Normal LV size and function.  Degenerative mitral valve disease with failed posterior leaflet (P2 segment), Severe MR with pulmonary vein systolic flow reversal. Moderate-TR.     TEE WITHOUT CARDIOVERSION N/A 03/19/2016   Procedure: TRANSESOPHAGEAL ECHOCARDIOGRAM (TEE);  Surgeon: [name], MD;  Location: [facility] OR;  Service: Open Heart Surgery;   Laterality: N/A;   TRANSTHORACIC ECHOCARDIOGRAM  02/14/2016   EF 50-55% with moderate LVH. Possible inferolateral hypokinesis. Moderate-severe MR with posterior leaflet prolapse, cannot rule out flail. Severe LA dilation. Moderate TR.     Family History  Problem Relation Age of Onset   Hypertension Mother    Stroke Mother    Heart disease Father    Heart attack Father    Dementia Sister    Retinoblastoma Son    COPD Neg Hx    Cancer Neg Hx    Diabetes Neg Hx      Social History   Socioeconomic History   Marital status: Married    Spouse name: Not on file   Number of children: Not on file   Years of education: Not on file   Highest education level: Associate degree: occupational, Scientist, product/process development, or vocational program  Occupational History   Occupation: retired  Tobacco Use   Smoking status: Never   Smokeless tobacco: Never  Vaping Use   Vaping status: Never Used  Substance and Sexual Activity   Alcohol use: No   Drug use: No   Sexual activity: Not Currently  Other Topics Concern   Not on file  Social History Narrative   Not on file   Social Determinants of Health   Financial Resource Strain: Low Risk  (12/02/2022)   Overall Financial Resource Strain (CARDIA)    Difficulty of Paying Living Expenses: Not hard at all  Food Insecurity: No Food Insecurity (12/02/2022)   Hunger Vital Sign    Worried About Running Out of Food in the Last Year: Never true    Ran Out of Food in the Last Year: Never true  Transportation Needs: No Transportation Needs (12/02/2022)   PRAPARE - Administrator, Civil Service (Medical): No    Lack of Transportation (Non-Medical): No  Physical Activity: Unknown (12/02/2022)   Exercise Vital Sign    Days of Exercise per Week: Patient declined    Minutes of Exercise per Session: 40 min  Stress: No Stress Concern Present (12/02/2022)   [employer] of Occupational Health - Occupational Stress Questionnaire    Feeling of Stress : Not at all   Social Connections: Moderately Integrated (12/02/2022)   Social Connection and Isolation Panel [NHANES]    Frequency of Communication with Friends and Family: More than three times a week    Frequency of Social Gatherings with Friends and Family: Patient declined    Attends Religious Services: More than 4 times per year    Active Member of [employer] or Organizations: No    Attends Banker Meetings: Never    Marital Status: Married  Catering manager Violence: Not At Risk (08/06/2022)   Humiliation, Afraid, Rape, and Kick questionnaire    Fear of Current or Ex-Partner: No    Emotionally Abused: No    Physically Abused: No    Sexually Abused: No     BP 130/66   Pulse 76   Ht 5\' 11"  (1.803 m)   Wt 175 lb 3.2 oz (79.5 kg)   SpO2 97%  BMI 24.44 kg/m   Physical Exam:  Well appearing NAD HEENT: Unremarkable Neck:  No JVD, no thyromegally Lymphatics:  No adenopathy Back:  No CVA tenderness Lungs:  Clear with no wheezes HEART:  Regular rate rhythm, no murmurs, no rubs, no clicks Abd:  soft, positive bowel sounds, no organomegally, no rebound, no guarding Ext:  2 plus pulses, no edema, no cyanosis, no clubbing Skin:  No rashes no nodules Neuro:  CN II through XII intact, motor grossly intact  EKG - nsr with AV pacing and pacing induced LBBB  DEVICE  Normal device function.  See PaceArt for details.   Assess/Plan:  1. VT - he has had no VT since his last visit on amiodarone. He will continue 200 mg daily, none on Sunday. 2. Chronic systolic heart failure - his symptoms are class 2. He has refused upgrade to a Biv which is reasonable while his CHF is controlled. He is encouraged to maintain a low sodium diet. I would anticipating adding an LV lead when he reaches ERI in just over a year. 3. ICD - his Medtronic DDD ICD is working normally. We will recheck in several months.  4. MR - he has none on exam. He will undergo watchful waiting.    Sharlot Gowda Emmily Pellegrin,MD

## 2023-04-07 NOTE — Patient Instructions (Signed)

## 2023-04-14 ENCOUNTER — Other Ambulatory Visit: Payer: Self-pay | Admitting: Internal Medicine

## 2023-04-14 DIAGNOSIS — I5022 Chronic systolic (congestive) heart failure: Secondary | ICD-10-CM

## 2023-04-14 DIAGNOSIS — I472 Ventricular tachycardia, unspecified: Secondary | ICD-10-CM

## 2023-04-14 DIAGNOSIS — I428 Other cardiomyopathies: Secondary | ICD-10-CM

## 2023-04-14 DIAGNOSIS — Z95810 Presence of automatic (implantable) cardiac defibrillator: Secondary | ICD-10-CM

## 2023-04-15 NOTE — Telephone Encounter (Signed)
Good Morning,   Could you please schedule this patient an overdue 6 month follow up visit? The patient was last seen by Dr. Okey Dupre on 06-12-2022. Thank you so much.

## 2023-04-15 NOTE — Telephone Encounter (Signed)
Left voice mail to schedule appt

## 2023-06-06 NOTE — Patient Instructions (Signed)
 Be Involved in Caring For Your Health:  Taking Medications When medications are taken as directed, they can greatly improve your health. But if they are not taken as prescribed, they may not work. In some cases, not taking them correctly can be harmful. To help ensure your treatment remains effective and safe, understand your medications and how to take them. Bring your medications to each visit for review by your provider.  Your lab results, notes, and after visit summary will be available on My Chart. We strongly encourage you to use this feature. If lab results are abnormal the clinic will contact you with the appropriate steps. If the clinic does not contact you assume the results are satisfactory. You can always view your results on My Chart. If you have questions regarding your health or results, please contact the clinic during office hours. You can also ask questions on My Chart.  We at [facility] are grateful that you chose [place] to provide your care. We strive to provide evidence-based and compassionate care and are always looking for feedback. If you get a survey from the clinic please complete this so we can hear your opinions.  Heart-Healthy Eating Plan Many factors influence your heart health, including eating and exercise habits. Heart health is also called coronary health. Coronary risk increases with abnormal blood fat (lipid) levels. A heart-healthy eating plan includes limiting unhealthy fats, increasing healthy fats, limiting salt (sodium) intake, and making other diet and lifestyle changes. What is my plan? Your health care provider may recommend that: You limit your fat intake to _________% or less of your total calories each day. You limit your saturated fat intake to _________% or less of your total calories each day. You limit the amount of cholesterol in your diet to less than _________ mg per day. You limit the amount of sodium in your diet to less than _________  mg per day. What are tips for following this plan? Cooking Cook foods using methods other than frying. Baking, boiling, grilling, and broiling are all good options. Other ways to reduce fat include: Removing the skin from poultry. Removing all visible fats from meats. Steaming vegetables in water or broth. Meal planning  At meals, imagine dividing your plate into fourths: Fill one-half of your plate with vegetables and green salads. Fill one-fourth of your plate with whole grains. Fill one-fourth of your plate with lean protein foods. Eat 2-4 cups of vegetables per day. One cup of vegetables equals 1 cup (91 g) broccoli or cauliflower florets, 2 medium carrots, 1 large bell pepper, 1 large sweet potato, 1 large tomato, 1 medium white potato, 2 cups (150 g) raw leafy greens. Eat 1-2 cups of fruit per day. One cup of fruit equals 1 small apple, 1 large banana, 1 cup (237 g) mixed fruit, 1 large orange,  cup (82 g) dried fruit, 1 cup (240 mL) 100% fruit juice. Eat more foods that contain soluble fiber. Examples include apples, broccoli, carrots, beans, peas, and barley. Aim to get 25-30 g of fiber per day. Increase your consumption of legumes, nuts, and seeds to 4-5 servings per week. One serving of dried beans or legumes equals  cup (90 g) cooked, 1 serving of nuts is  oz (12 almonds, 24 pistachios, or 7 walnut halves), and 1 serving of seeds equals  oz (8 g). Fats Choose healthy fats more often. Choose monounsaturated and polyunsaturated fats, such as olive and canola oils, avocado oil, flaxseeds, walnuts, almonds, and seeds. Eat  more omega-3 fats. Choose salmon, mackerel, sardines, tuna, flaxseed oil, and ground flaxseeds. Aim to eat fish at least 2 times each week. Check food labels carefully to identify foods with trans fats or high amounts of saturated fat. Limit saturated fats. These are found in animal products, such as meats, butter, and cream. Plant sources of saturated fats  include palm oil, palm kernel oil, and coconut oil. Avoid foods with partially hydrogenated oils in them. These contain trans fats. Examples are stick margarine, some tub margarines, cookies, crackers, and other baked goods. Avoid fried foods. General information Eat more home-cooked food and less restaurant, buffet, and fast food. Limit or avoid alcohol. Limit foods that are high in added sugar and simple starches such as foods made using white refined flour (white breads, pastries, sweets). Lose weight if you are overweight. Losing just 5-10% of your body weight can help your overall health and prevent diseases such as diabetes and heart disease. Monitor your sodium intake, especially if you have high blood pressure. Talk with your health care provider about your sodium intake. Try to incorporate more vegetarian meals weekly. What foods should I eat? Fruits All fresh, canned (in natural juice), or frozen fruits. Vegetables Fresh or frozen vegetables (raw, steamed, roasted, or grilled). Green salads. Grains Most grains. Choose whole wheat and whole grains most of the time. Rice and pasta, including brown rice and pastas made with whole wheat. Meats and other proteins Lean, well-trimmed beef, veal, pork, and lamb. Chicken and [place] without skin. All fish and shellfish. Wild duck, rabbit, pheasant, and venison. Egg whites or low-cholesterol egg substitutes. Dried beans, peas, lentils, and tofu. Seeds and most nuts. Dairy Low-fat or nonfat cheeses, including ricotta and mozzarella. Skim or 1% milk (liquid, powdered, or evaporated). Buttermilk made with low-fat milk. Nonfat or low-fat yogurt. Fats and oils Non-hydrogenated (trans-free) margarines. Vegetable oils, including soybean, sesame, sunflower, olive, avocado, peanut, safflower, corn, canola, and cottonseed. Salad dressings or mayonnaise made with a vegetable oil. Beverages Water (mineral or sparkling). Coffee and tea. Unsweetened ice  tea. Diet beverages. Sweets and desserts Sherbet, gelatin, and fruit ice. Small amounts of dark chocolate. Limit all sweets and desserts. Seasonings and condiments All seasonings and condiments. The items listed above may not be a complete list of foods and beverages you can eat. Contact a dietitian for more options. What foods should I avoid? Fruits Canned fruit in heavy syrup. Fruit in cream or butter sauce. Fried fruit. Limit coconut. Vegetables Vegetables cooked in cheese, cream, or butter sauce. Fried vegetables. Grains Breads made with saturated or trans fats, oils, or whole milk. Croissants. Sweet rolls. Donuts. High-fat crackers, such as cheese crackers and chips. Meats and other proteins Fatty meats, such as hot dogs, ribs, sausage, bacon, rib-eye roast or steak. High-fat deli meats, such as salami and bologna. Caviar. Domestic duck and goose. Organ meats, such as liver. Dairy Cream, sour cream, cream cheese, and creamed cottage cheese. Whole-milk cheeses. Whole or 2% milk (liquid, evaporated, or condensed). Whole buttermilk. Cream sauce or high-fat cheese sauce. Whole-milk yogurt. Fats and oils Meat fat, or shortening. Cocoa butter, hydrogenated oils, palm oil, coconut oil, palm kernel oil. Solid fats and shortenings, including bacon fat, salt pork, lard, and butter. Nondairy cream substitutes. Salad dressings with cheese or sour cream. Beverages Regular sodas and any drinks with added sugar. Sweets and desserts Frosting. Pudding. Cookies. Cakes. Pies. Milk chocolate or white chocolate. Buttered syrups. Full-fat ice cream or ice cream drinks. The items listed above may  not be a complete list of foods and beverages to avoid. Contact a dietitian for more information. Summary Heart-healthy meal planning includes limiting unhealthy fats, increasing healthy fats, limiting salt (sodium) intake and making other diet and lifestyle changes. Lose weight if you are overweight. Losing just  5-10% of your body weight can help your overall health and prevent diseases such as diabetes and heart disease. Focus on eating a balance of foods, including fruits and vegetables, low-fat or nonfat dairy, lean protein, nuts and legumes, whole grains, and heart-healthy oils and fats. This information is not intended to replace advice given to you by your health care provider. Make sure you discuss any questions you have with your health care provider. Document Revised: 06/25/2021 Document Reviewed: 06/25/2021 Elsevier Patient Education  2024 [employer].

## 2023-06-11 ENCOUNTER — Encounter: Payer: Self-pay | Admitting: Nurse Practitioner

## 2023-06-11 ENCOUNTER — Ambulatory Visit: Payer: Medicare Other | Admitting: Nurse Practitioner

## 2023-06-11 VITALS — BP 125/73 | HR 82 | Temp 98.7°F | Resp 18 | Ht 71.0 in | Wt 181.0 lb

## 2023-06-11 DIAGNOSIS — I2510 Atherosclerotic heart disease of native coronary artery without angina pectoris: Secondary | ICD-10-CM

## 2023-06-11 DIAGNOSIS — N1832 Chronic kidney disease, stage 3b: Secondary | ICD-10-CM

## 2023-06-11 DIAGNOSIS — Z23 Encounter for immunization: Secondary | ICD-10-CM

## 2023-06-11 DIAGNOSIS — I5022 Chronic systolic (congestive) heart failure: Secondary | ICD-10-CM

## 2023-06-11 DIAGNOSIS — I38 Endocarditis, valve unspecified: Secondary | ICD-10-CM

## 2023-06-11 DIAGNOSIS — I428 Other cardiomyopathies: Secondary | ICD-10-CM

## 2023-06-11 DIAGNOSIS — Z8679 Personal history of other diseases of the circulatory system: Secondary | ICD-10-CM

## 2023-06-11 DIAGNOSIS — R21 Rash and other nonspecific skin eruption: Secondary | ICD-10-CM

## 2023-06-11 DIAGNOSIS — G510 Bell's palsy: Secondary | ICD-10-CM

## 2023-06-11 DIAGNOSIS — E782 Mixed hyperlipidemia: Secondary | ICD-10-CM

## 2023-06-11 DIAGNOSIS — I130 Hypertensive heart and chronic kidney disease with heart failure and stage 1 through stage 4 chronic kidney disease, or unspecified chronic kidney disease: Secondary | ICD-10-CM | POA: Diagnosis not present

## 2023-06-11 DIAGNOSIS — Z95810 Presence of automatic (implantable) cardiac defibrillator: Secondary | ICD-10-CM

## 2023-06-11 DIAGNOSIS — N183 Chronic kidney disease, stage 3 unspecified: Secondary | ICD-10-CM

## 2023-06-11 DIAGNOSIS — I7123 Aneurysm of the descending thoracic aorta, without rupture: Secondary | ICD-10-CM

## 2023-06-11 DIAGNOSIS — R7301 Impaired fasting glucose: Secondary | ICD-10-CM

## 2023-06-11 LAB — MICROALBUMIN, URINE WAIVED
Creatinine, Urine Waived: 100 mg/dL (ref 10–300)
Microalb, Ur Waived: 30 mg/L — ABNORMAL HIGH (ref 0–19)
Microalb/Creat Ratio: <30 mg/g (ref <30)

## 2023-06-11 NOTE — Assessment & Plan Note (Addendum)
 Chronic, stable.  Denies any recent CP.  Continue collaboration with cardiology and current medication regimen.  Recent notes reviewed.

## 2023-06-11 NOTE — Assessment & Plan Note (Signed)
Chronic, ongoing.  Continue collaboration with cardiology and reviewed recommendations. Euvolemic on exam.  Recent EF 09/06/21 was 35-40%. - Reminded to call for an overnight weight gain of >2 pounds or a weekly weight weight of >5 pounds - not adding salt to his food and has been reading food labels. Reviewed the importance of keeping daily sodium intake to <2000mg daily  - Avoid Ibuprofen products 

## 2023-06-11 NOTE — Assessment & Plan Note (Signed)
 Chronic, ongoing.  BP remains below goal for age.  Continue current medication regimen and collaboration with cardiology. Recommend he monitor BP at least a few mornings a week at home and document.  DASH diet at home.  Continue current medication regimen and adjust as needed.  Labs today: CMP, TSH, urine ALB.  Return in 6 months per patient preference.

## 2023-06-11 NOTE — Assessment & Plan Note (Signed)
 Stable, he continues to perform exercises at home and visits with eye doctor.

## 2023-06-11 NOTE — Assessment & Plan Note (Signed)
Continue collaboration with cardiology for checks. 

## 2023-06-11 NOTE — Progress Notes (Signed)
 BP 125/73 (BP Location: Left Arm)   Pulse 82   Temp 98.7 F (37.1 C) (Oral)   Resp 18   Ht 5' 11 (1.803 m)   Wt 181 lb (82.1 kg)   SpO2 94%   BMI 25.24 kg/m    Subjective:    Patient ID: Roy Palmer, male    DOB: [birth year], 89 y.o.   MRN: [id number]  HPI: Roy Palmer is a 89 y.o. male  Chief Complaint  Patient presents with   Hypertension   Hyperlipidemia   Congestive Heart Failure   Chronic Kidney Disease   Lives with wife of 71 years.  HYPERTENSION / HYPERLIPIDEMIA WITH CHF Is intolerant to all classes HF therapy.  At this time is on Pacerone  + ASA.  Follows with cardiology last on 04/07/23 with no changes.  09/06/21 EF was 35-40%. History of v tach, ICD in place, checks with cardiology.  Has ongoing issues with Right-sided Bell's Palsy, follows with eye doctor.  Does exercise at home. He continues to be very active at home, including hunting deer.  Thoracic aortic aneurysm noted on 2017 CT measuring 3.5 AP at that time. Discussed risks and benefits of low dose CT for monitoring- does not wish to obtain at this time, last check was 10/22/19 - 4 cm. Not taking statin, refuses to start. Satisfied with current treatment? yes Duration of hypertension: chronic BP monitoring frequency: once a month BP range: <130/80 on average BP medication side effects: no Duration of hyperlipidemia: chronic Cholesterol medication side effects: no Cholesterol supplements: none Aspirin : yes Recent stressors: no Recurrent headaches: no Visual changes: no Palpitations: no Dyspnea: no Chest pain: no Lower extremity edema: no Dizzy/lightheaded: no  The ASCVD Risk score (Arnett DK, et al., 2019) failed to calculate for the following reasons:   The 2019 ASCVD risk score is only valid for ages 30 to 60  Impaired Fasting Glucose HbA1C:  Lab Results  Component Value Date   HGBA1C 5.9 (H) 12/04/2022  Duration of elevated blood sugar: years Polydipsia: no Polyuria: no Weight change:  no Visual disturbance: no Glucose Monitoring: no    Accucheck frequency: Not Checking    Fasting glucose:     Post prandial:  Diabetic Education: Not Completed Family history of diabetes: yes   RASH To both arms. 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 CKD status: stable Medications renally dose: no Previous renal evaluation: yes Pneumovax:  Up to Date Influenza Vaccine:  Up to Date    Relevant past medical, surgical, family and social history reviewed and updated as indicated. Interim medical history since our last visit reviewed. Allergies and medications reviewed and updated.  Review of Systems  Constitutional:  Negative for activity change, diaphoresis, fatigue and fever.  Respiratory:  Negative for cough, chest tightness, shortness of breath and wheezing.   Cardiovascular:  Negative for chest pain, palpitations and leg swelling.  Gastrointestinal: Negative.   Skin:  Positive for rash.  Neurological: Negative.   Psychiatric/Behavioral: Negative.     Per HPI unless specifically indicated above     Objective:  BP 125/73 (BP Location: Left Arm)   Pulse 82   Temp 98.7 F (37.1 C) (Oral)   Resp 18   Ht 5' 11 (1.803 m)   Wt 181 lb (82.1 kg)   SpO2 94%   BMI 25.24 kg/m   Wt Readings from Last 3 Encounters:  06/11/23 181 lb (82.1 kg)  04/07/23 175 lb 3.2 oz (79.5 kg)  12/04/22 178 lb 6.4 oz (80.9 kg)    Physical Exam Vitals and nursing note reviewed.  Constitutional:      General:  He is awake. He is not in acute distress.    Appearance: He is well-developed and well-groomed. He is not ill-appearing or toxic-appearing.  HENT:     Head: Normocephalic and atraumatic.     Right Ear: Hearing and external ear normal. No drainage.     Left Ear: Hearing and external ear normal. No drainage.  Eyes:     General: Lids are normal.        Right eye: No discharge.        Left eye: No discharge.     Conjunctiva/sclera: Conjunctivae normal.     Pupils: Pupils are equal, round, and reactive to light.  Neck:     Thyroid : No thyromegaly.     Vascular: No carotid bruit.  Cardiovascular:     Rate and Rhythm: Normal rate and regular rhythm.     Heart sounds: Normal heart sounds, S1 normal and S2 normal. No murmur heard.    No gallop.  Pulmonary:     Effort: Pulmonary effort is normal. No accessory muscle usage or respiratory distress.     Breath sounds: Normal breath sounds.  Abdominal:     General: Bowel sounds are normal. There is no distension.     Palpations: Abdomen is soft.     Tenderness: There is no abdominal tenderness.  Musculoskeletal:        General: Normal range of motion.     Cervical back: Normal range of motion and neck supple.     Right lower leg: No edema.     Left lower leg: No edema.  Lymphadenopathy:     Cervical: No cervical adenopathy.  Skin:    General: Skin is warm and dry.     Findings: Rash present.     Comments: Scattered (x 3 to each arm) round, bug bite in appearance, welts.  Skin intact.    Neurological:     Mental Status: He is alert and oriented to person, place, and time.     Deep Tendon Reflexes:     Reflex Scores:      Brachioradialis reflexes are 2+ on the right side and 2+ on the left side.      Patellar reflexes are 2+ on the right side and 2+ on the left side.    Comments: Mild droop of right eyelid, but able to open eye.  Psychiatric:        Attention and Perception: Attention normal.        Mood and Affect: Mood normal.         Speech: Speech normal.        Behavior: Behavior normal. Behavior is cooperative.        Thought Content: Thought content normal.        Judgment: Judgment normal.    Results for orders placed or performed in visit on 04/07/23  CUP PACEART [facility] DEVICE CHECK   Collection Time: 04/07/23  8:04 PM  Result Value Ref  Range   Pulse Generator Manufacturer MERM    Date Time Interrogation Session [phone]    Pulse Gen Model [id number] Evera MRI XT DR    Pulse Gen Serial Number [id number] [facility]    Clinic Name [facility]    Implantable Pulse Generator Type Implantable Cardiac Defibulator    Implantable Pulse Generator Implant Date [id number]    Implantable Lead Manufacturer MERM    Implantable Lead Model 5076 CapSureFix Novus MRI SureScan    Implantable Lead Serial Number [id number]    Implantable Lead Implant Date 79828974    Implantable Lead Location Detail 1 APPENDAGE    Implantable Lead Location [id number]    Implantable Lead Connection Status [id number]    Implantable Lead Manufacturer [facility]    Implantable Lead Model [id number] Sprint Quattro Secure S MRI SureScan    Implantable Lead Serial Number [id number]    Implantable Lead Implant Date 79828974    Implantable Lead Location Detail 1 [employer]PE[employer]    Implantable Lead Location [id number]    Implantable Lead Connection Status [id number]    Eval Rhythm AS 45/ VP 30       Assessment & Plan:   Problem List Items Addressed This Visit       Cardiovascular and Mediastinum   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  Continue collaboration with cardiology and reviewed recommendations. Euvolemic on exam.  Recent EF 09/06/21 was 35-40%. - Reminded to call for an overnight weight gain of >2 pounds or a weekly weight weight of >5 pounds - not adding salt to his food and has been reading food labels. Reviewed the importance of keeping daily sodium intake to 2000mg  daily  - Avoid Ibuprofen products      Relevant Orders    Comprehensive metabolic panel   Coronary artery disease involving native coronary artery of native heart without angina pectoris   Chronic, stable.  Denies any recent CP.  Continue collaboration with cardiology and current medication regimen.  Recent notes reviewed.      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  BP remains below goal for age.  Continue current medication regimen and collaboration with cardiology. Recommend he monitor BP at least a few mornings a week at home and document.  DASH diet at home.  Continue current medication regimen and adjust as needed.  Labs today: CMP, TSH, urine ALB.  Return in 6 months per patient preference.       Relevant Orders   Microalbumin, Urine Waived   Comprehensive metabolic panel   TSH   Nonischemic cardiomyopathy (HCC)   Followed by cardiology, continue this collaboration. Recent notes reviewed.      Thoracic aortic aneurysm (HCC)   Ongoing and stable on imaging 2021.  Discussed need for repeat imaging with him, he declines at this time.  Continue good BP control.  Refuses statin.        Endocrine   IFG (impaired fasting glucose)   A1c last check 5.9%.  Continue diet focus at home.  Recheck today.  Goal <8% due to age.      Relevant Orders   HgB A1c     Nervous and Auditory   Right-sided Bell's palsy   Stable, he continues to perform exercises at home and visits with eye doctor.        Genitourinary   CKD (chronic kidney disease), stage IIIa - Primary   Chronic, ongoing.  Continue to monitor,  labs obtained today.  Return to nephrology as needed in future.  Avoid Ibuprofen products and ensure good water intake at home.      Relevant Orders   Microalbumin, Urine Waived   Comprehensive metabolic panel     Other   History of ventricular tachycardia   Stable, ICD in place.  Continue collaboration with cardiology team.      Hyperlipidemia   Chronic, no statin use, refuses.   With advanced age this is appropriate, will continue to monitor and focus on diet.      Relevant Orders   Comprehensive metabolic panel   Lipid Panel w/o Chol/HDL Ratio   ICD (implantable cardioverter-defibrillator) in place   Continue collaboration with cardiology for checks.      Other Visit Diagnoses       Rash       Suspect bug bites, he will obtain more Triderma online as this works well for him,        Follow up plan: Return in about 6 months (around 12/09/2023) for HTN/HLD, CKD, IFG.

## 2023-06-11 NOTE — Assessment & Plan Note (Signed)
 Followed by cardiology, continue this collaboration. Recent notes reviewed.

## 2023-06-11 NOTE — Assessment & Plan Note (Signed)
Stable, ICD in place.  Continue collaboration with cardiology team. 

## 2023-06-11 NOTE — Assessment & Plan Note (Signed)
 A1c last check 5.9%.  Continue diet focus at home.  Recheck today.  Goal <8% due to age.

## 2023-06-11 NOTE — Assessment & Plan Note (Signed)
Chronic, no statin use, refuses.  With advanced age this is appropriate, will continue to monitor and focus on diet. 

## 2023-06-11 NOTE — Assessment & Plan Note (Signed)
 Chronic, ongoing.  Continue to monitor, labs obtained today.  Return to nephrology as needed in future.  Avoid Ibuprofen products and ensure good water intake at home.

## 2023-06-11 NOTE — Assessment & Plan Note (Signed)
Ongoing and stable on imaging 2021.  Discussed need for repeat imaging with him, he declines at this time.  Continue good BP control.  Refuses statin. 

## 2023-06-12 LAB — COMPREHENSIVE METABOLIC PANEL
ALT: 10 [IU]/L (ref 0–44)
AST: 13 [IU]/L (ref 0–40)
Albumin: 4.1 g/dL (ref 3.6–4.6)
Alkaline Phosphatase: 69 [IU]/L (ref 44–121)
BUN/Creatinine Ratio: 15 (ref 10–24)
BUN: 24 mg/dL (ref 10–36)
Bilirubin Total: 0.4 mg/dL (ref 0.0–1.2)
CO2: 23 mmol/L (ref 20–29)
Calcium: 8.8 mg/dL (ref 8.6–10.2)
Chloride: 101 mmol/L (ref 96–106)
Creatinine, Ser: 1.59 mg/dL — ABNORMAL HIGH (ref 0.76–1.27)
Globulin, Total: 1.8 g/dL (ref 1.5–4.5)
Glucose: 153 mg/dL — ABNORMAL HIGH (ref 70–99)
Potassium: 4.7 mmol/L (ref 3.5–5.2)
Sodium: 138 mmol/L (ref 134–144)
Total Protein: 5.9 g/dL — ABNORMAL LOW (ref 6.0–8.5)
eGFR: 41 mL/min/{1.73_m2} — ABNORMAL LOW (ref >59)

## 2023-06-12 LAB — HEMOGLOBIN A1C
Est. average glucose Bld gHb Est-mCnc: 126 mg/dL
Hgb A1c MFr Bld: 6 % — ABNORMAL HIGH (ref 4.8–5.6)

## 2023-06-12 LAB — LIPID PANEL W/O CHOL/HDL RATIO
Cholesterol, Total: 199 mg/dL (ref 100–199)
HDL: 42 mg/dL (ref >39)
LDL Chol Calc (NIH): 118 mg/dL — ABNORMAL HIGH (ref 0–99)
Triglycerides: 221 mg/dL — ABNORMAL HIGH (ref 0–149)
VLDL Cholesterol Cal: 39 mg/dL (ref 5–40)

## 2023-06-12 LAB — TSH: TSH: 1.16 u[IU]/mL (ref 0.450–4.500)

## 2023-06-12 NOTE — Progress Notes (Signed)
 Contacted via MyChart   Good afternoon Roy Palmer, your labs have returned: - Kidney function, creatinine and eGFR, continues to show stable chronic kidney disease stage 3b with no worsening.  Liver function, AST and ALT, is normal. - Lipid panel continues to show elevation in LDL, bad cholesterol.  I know you prefer not to take medication for this so focus heavily on health diet choices and regular exercise. - A1c did trend up a little, but remains in prediabetes range.  Focus on diet. - Thyroid  lab, TSH, is normal.  Any questions? Keep being amazing!!  Thank you for allowing me to participate in your care.  I appreciate you. Kindest regards, Tinsley Lomas

## 2023-06-16 ENCOUNTER — Ambulatory Visit (INDEPENDENT_AMBULATORY_CARE_PROVIDER_SITE_OTHER): Payer: Medicare Other

## 2023-06-16 DIAGNOSIS — I428 Other cardiomyopathies: Secondary | ICD-10-CM

## 2023-06-16 DIAGNOSIS — I472 Ventricular tachycardia, unspecified: Secondary | ICD-10-CM | POA: Diagnosis not present

## 2023-06-18 LAB — CUP PACEART REMOTE DEVICE CHECK
Battery Remaining Longevity: 13 mo
Battery Voltage: 2.88 V
Brady Statistic AP VP Percent: 93.78 %
Brady Statistic AP VS Percent: 0.02 %
Brady Statistic AS VP Percent: 6.18 %
Brady Statistic AS VS Percent: 0.02 %
Brady Statistic RA Percent Paced: 93.69 %
Brady Statistic RV Percent Paced: 99.84 %
Date Time Interrogation Session: 20250115123853
HighPow Impedance: 67 Ohm
Implantable Lead Connection Status: 753985
Implantable Lead Connection Status: 753985
Implantable Lead Implant Date: 20171025
Implantable Lead Implant Date: 20171025
Implantable Lead Location: 753859
Implantable Lead Location: 753860
Implantable Lead Model: 5076
Implantable Pulse Generator Implant Date: 20171025
Lead Channel Impedance Value: 342 Ohm
Lead Channel Impedance Value: 342 Ohm
Lead Channel Impedance Value: 399 Ohm
Lead Channel Pacing Threshold Amplitude: 0.625 V
Lead Channel Pacing Threshold Amplitude: 0.625 V
Lead Channel Pacing Threshold Pulse Width: 0.4 ms
Lead Channel Pacing Threshold Pulse Width: 0.4 ms
Lead Channel Sensing Intrinsic Amplitude: 1.25 mV
Lead Channel Sensing Intrinsic Amplitude: 1.25 mV
Lead Channel Sensing Intrinsic Amplitude: 11.625 mV
Lead Channel Sensing Intrinsic Amplitude: 15.75 mV
Lead Channel Setting Pacing Amplitude: 2 V
Lead Channel Setting Pacing Amplitude: 2.5 V
Lead Channel Setting Pacing Pulse Width: 0.4 ms
Lead Channel Setting Sensing Sensitivity: 0.3 mV
Zone Setting Status: 755011

## 2023-07-29 NOTE — Addendum Note (Signed)
 Addended by: Geralyn Flash D on: 07/29/2023 03:16 PM   Modules accepted: Orders

## 2023-07-29 NOTE — Progress Notes (Signed)
 Remote ICD transmission.

## 2023-08-12 ENCOUNTER — Ambulatory Visit: Payer: Medicare Other

## 2023-08-12 VITALS — BP 125/73 | Ht 71.0 in | Wt 181.0 lb

## 2023-08-12 DIAGNOSIS — Z0000 Encounter for general adult medical examination without abnormal findings: Secondary | ICD-10-CM | POA: Diagnosis not present

## 2023-08-12 NOTE — Progress Notes (Signed)
 Subjective:   Roy Palmer is a 89 y.o. who presents for a Medicare Wellness preventive visit.  Visit Complete: Virtual I connected with  Roy Palmer on 08/12/23 by a audio enabled telemedicine application and verified that I am speaking with the correct person using two identifiers.  Patient Location: Home  Provider Location: Office/Clinic  I discussed the limitations of evaluation and management by telemedicine. The patient expressed understanding and agreed to proceed.  Vital Signs: Because this visit was a virtual/telehealth visit, some criteria may be missing or patient reported. Any vitals not documented were not able to be obtained and vitals that have been documented are patient reported.  VideoDeclined- This patient declined Librarian, academic. Therefore the visit was completed with audio only.  AWV Questionnaire: No: Patient Medicare AWV questionnaire was not completed prior to this visit.  Cardiac Risk Factors include: advanced age (>67men, >16 women);male gender;dyslipidemia;hypertension;Other (see comment), Risk factor comments: CHF, CAD     Objective:    Today's Vitals   08/12/23 1123  BP: 125/73  Weight: 181 lb (82.1 kg)  Height: 5\' 11"  (1.803 m)   Body mass index is 25.24 kg/m.     08/12/2023   11:31 AM 08/06/2022    2:37 PM 07/24/2021   12:01 PM 07/17/2020    1:02 PM 12/21/2019    3:00 PM 12/21/2019   11:37 AM 10/21/2019    2:03 AM  Advanced Directives  Does Patient Have a Medical Advance Directive? Yes No Yes Yes No No Yes  Type of Estate agent of [place];Living will  Healthcare Power of [employer] of [place];Living will   Healthcare Power of Attorney  Does patient want to make changes to medical advance directive?       No - Patient declined  Copy of Healthcare Power of Attorney in Chart? No - copy requested  Yes - validated most recent copy scanned in chart (See row information) No -  copy requested   No - copy requested  Would patient like information on creating a medical advance directive?  No - Patient declined   No - Patient declined No - Patient declined     Current Medications (verified) Outpatient Encounter Medications as of 08/12/2023  Medication Sig   acetaminophen (TYLENOL) 325 MG tablet Take 325-650 mg by mouth every 6 (six) hours as needed for mild pain, moderate pain or fever.    amiodarone (PACERONE) 200 MG tablet TAKE 1 TABLET BY MOUTH EVERY MONDAY THROUGH SATURDAY, DO NOT TAKE ON SUNDAY   Multiple Vitamins-Minerals (MULTIVITAMIN ADULTS 50+ PO) Take 1 tablet by mouth daily.   No facility-administered encounter medications on file as of 08/12/2023.    Allergies (verified) Amiodarone and Lyrica [pregabalin]   History: Past Medical History:  Diagnosis Date   AAA (abdominal aortic aneurysm) without rupture (HCC) 03/12/2016   Small - measured 3.3 x 3.5 cm by CTA   CKD (chronic kidney disease) stage 3, GFR 30-59 ml/min (HCC)    Essential hypertension    Hyperlipidemia    Hypertensive kidney disease    Incidental pulmonary nodule 03/12/2016   Vague opacity right lung base requires radiographic follow up - index of suspicion is LOW   Near syncope    Cardiogenic, related to an SVT and severe MR   Non-sustained ventricular tachycardia (HCC)    S/P minimally invasive mitral valve repair 03/19/2016   Complex valvuloplasty including triangular resection of flail segment of posterior leaflet, chordal transposition x1, artificial  Gore-tex neochord placement x4 and 34 mm Sorin Memo 3D ring annuloplasty via right mini thoracotomy approach with clipping of LA appendage   Severe mitral regurgitation by prior echocardiogram 02/23/2016   Confirmed by TEE   Syncope 03/15/2016   Tennis elbow left   Thoracic aortic aneurysm (HCC) 03/12/2016   Very very mild fusiform enlargement of distal transverse and proximal descending thoracic aorta noted on CTA   Trigeminal  neuralgia    Past Surgical History:  Procedure Laterality Date   CARDIAC CATHETERIZATION N/A 02/23/2016   Procedure: Right/Left Heart Cath and Coronary Angiography;  Surgeon: [name], MD;  Location[facility] INVASIVE CV LAB;  Service: Cardiovascular: Angiographic minimal coronary disease. Normal left and right heart Pressures. Decreased CO/CI in settng of frequent PVCs & Severe MR.   CATARACT EXTRACTION, BILATERAL     COLONOSCOPY  08/2005   CYST EXCISION  10/2013   from neck   EP IMPLANTABLE DEVICE N/A 03/27/2016   Procedure: ICD Implant Dual Chamber;  Surgeon: Marinus Maw, MD;  Location: [facility] INVASIVE CV LAB;  Service: Cardiovascular;  Laterality: N/A;   ESOPHAGOGASTRODUODENOSCOPY  02/2010   MITRAL VALVE REPAIR Right 03/19/2016   Procedure: MINIMALLY INVASIVE MITRAL VALVE REPAIR (MVR);  Surgeon: [name], MD;  Location: [facility] OR;  Service: Open Heart Surgery;  Laterality: Right;   TEE WITHOUT CARDIOVERSION N/A 02/23/2016   Procedure: TRANSESOPHAGEAL ECHOCARDIOGRAM (TEE);  Surgeon: Quintella Reichert, MD;  Location: [facility] ENDOSCOPY;  Service: Cardiovascular: Normal LV size and function.  Degenerative mitral valve disease with failed posterior leaflet (P2 segment), Severe MR with pulmonary vein systolic flow reversal. Moderate-TR.     TEE WITHOUT CARDIOVERSION N/A 03/19/2016   Procedure: TRANSESOPHAGEAL ECHOCARDIOGRAM (TEE);  Surgeon: [name], MD;  Location: [facility] OR;  Service: Open Heart Surgery;  Laterality: N/A;   TRANSTHORACIC ECHOCARDIOGRAM  02/14/2016   EF 50-55% with moderate LVH. Possible inferolateral hypokinesis. Moderate-severe MR with posterior leaflet prolapse, cannot rule out flail. Severe LA dilation. Moderate TR.   Family History  Problem Relation Age of Onset   Hypertension Mother    Stroke Mother    Heart disease Father    Heart attack Father    Dementia Sister    Retinoblastoma Son    COPD Neg Hx    Cancer Neg Hx    Diabetes Neg Hx    Social History    Socioeconomic History   Marital status: Married    Spouse name: Patsy   Number of children: 4   Years of education: Not on file   Highest education level: Associate degree: occupational, Scientist, product/process development, or vocational program  Occupational History   Occupation: retired  Tobacco Use   Smoking status: Never   Smokeless tobacco: Never  Vaping Use   Vaping status: Never Used  Substance and Sexual Activity   Alcohol use: No   Drug use: No   Sexual activity: Not Currently  Other Topics Concern   Not on file  Social History Narrative   All live within an hour or so   Social Drivers of Corporate investment banker Strain: Low Risk  (08/12/2023)   Overall Financial Resource Strain (CARDIA)    Difficulty of Paying Living Expenses: Not hard at all  Food Insecurity: No Food Insecurity (08/12/2023)   Hunger Vital Sign    Worried About Running Out of Food in the Last Year: Never true    Ran Out of Food in the Last Year: Never true  Transportation Needs: No Transportation Needs (  12/02/2022)   PRAPARE - Administrator, Civil Service (Medical): No    Lack of Transportation (Non-Medical): No  Physical Activity: Insufficiently Active (08/12/2023)   Exercise Vital Sign    Days of Exercise per Week: 7 days    Minutes of Exercise per Session: 20 min  Stress: No Stress Concern Present (08/12/2023)   [employer] of Occupational Health - Occupational Stress Questionnaire    Feeling of Stress : Not at all  Social Connections: Moderately Integrated (08/12/2023)   Social Connection and Isolation Panel [NHANES]    Frequency of Communication with Friends and Family: More than three times a week    Frequency of Social Gatherings with Friends and Family: Twice a week    Attends Religious Services: More than 4 times per year    Active Member of [employer] or Organizations: No    Attends Engineer, structural: Never    Marital Status: Married    Tobacco Counseling Counseling given: Not  Answered    Clinical Intake:  Pre-visit preparation completed: Yes  Pain : No/denies pain     BMI - recorded: 25.24 Nutritional Status: BMI 25 -29 Overweight Nutritional Risks: None Diabetes: No  How often do you need to have someone help you when you read instructions, pamphlets, or other written materials from your doctor or pharmacy?: 1 - Never  Interpreter Needed?: No  Information entered by :: Courteny Egler, LPN   Activities of Daily Living     08/12/2023   11:25 AM  In your present state of health, do you have any difficulty performing the following activities:  Hearing? 1  Comment mild - needs ears cleaned out  Vision? 0  Difficulty concentrating or making decisions? 0  Walking or climbing stairs? 0  Dressing or bathing? 0  Doing errands, shopping? 0  Preparing Food and eating ? N  Using the Toilet? N  In the past six months, have you accidently leaked urine? N  Do you have problems with loss of bowel control? N  Managing your Medications? N  Managing your Finances? N  Housekeeping or managing your Housekeeping? N    Patient Care Team: Marjie Skiff, NP as PCP - General (Nurse Practitioner) End, Cristal Deer, MD as PCP - Cardiology (Cardiology) End, Cristal Deer, MD as Consulting Physician (Cardiology) Marinus Maw, MD as Consulting Physician (Cardiology) Galen Manila, MD as Referring Physician (Ophthalmology)  Indicate any recent Medical Services you may have received from other than [facility] providers in the past year (date may be approximate).     Assessment:   This is a routine wellness examination for Roy Palmer.  Hearing/Vision screen Hearing Screening - Comments:: Mild hearing loss - thinks he needs ears cleaned out Vision Screening - Comments:: Wears rx glasses - up to date with routine eye exams with [facility] ([place])   Goals Addressed             This Visit's Progress    Patient Stated   On track    07/17/2020, stay healthy        Depression Screen     08/12/2023   11:29 AM 06/11/2023    1:40 PM 12/04/2022    1:14 PM 08/06/2022    2:35 PM 05/29/2022    1:45 PM 05/09/2022    1:27 PM 06/21/2021   10:35 AM  PHQ 2/9 Scores  PHQ - 2 Score 0 0 0 0 0 0 0  PHQ- 9 Score 0 0 0 0 0 0 1  Fall Risk     08/12/2023   11:32 AM 06/11/2023    1:40 PM 12/04/2022    1:14 PM 08/06/2022    2:37 PM 05/29/2022    1:44 PM  Fall Risk   Falls in the past year? 0 0 0 0 0  Number falls in past yr: 0 0 0 0 0  Injury with Fall? 0 0 0 0 0  Risk for fall due to : No Fall Risks No Fall Risks No Fall Risks No Fall Risks No Fall Risks  Follow up Falls prevention discussed;Falls evaluation completed Falls evaluation completed Falls evaluation completed Falls prevention discussed;Falls evaluation completed Falls evaluation completed    MEDICARE RISK AT HOME:  Medicare Risk at Home Any stairs in or around the home?: Yes (just to basement) If so, are there any without handrails?: No Home free of loose throw rugs in walkways, pet beds, electrical cords, etc?: Yes Adequate lighting in your home to reduce risk of falls?: Yes Life alert?: No Use of a cane, walker or w/c?: No Grab bars in the bathroom?: Yes Shower chair or bench in shower?: Yes Elevated toilet seat or a handicapped toilet?: Yes  TIMED UP AND GO:  Was the test performed?  No  Cognitive Function: 6CIT completed        08/12/2023   11:34 AM 08/06/2022    2:41 PM 07/17/2020    1:04 PM 07/08/2018    2:24 PM 06/11/2017    1:22 PM  6CIT Screen  What Year? 0 points 0 points 0 points 0 points 0 points  What month? 0 points 0 points 0 points 0 points 0 points  What time? 0 points 0 points 0 points 0 points 0 points  Count back from 20 4 points 4 points 0 points 0 points 0 points  Months in reverse 0 points 2 points 2 points 0 points 0 points  Repeat phrase 4 points 8 points 6 points 0 points 2 points  Total Score 8 points 14 points 8 points 0 points 2 points     Immunizations Immunization History  Administered Date(s) Administered   Fluad Quad(high Dose 65+) 03/19/2019   Influenza,inj,Quad PF,6+ Mos 05/07/2016, 02/19/2017, 04/17/2018   Influenza-Unspecified 04/19/2015   PFIZER(Purple Top)SARS-COV-2 Vaccination 08/25/2019, 09/15/2019   Pneumococcal Conjugate-13 10/29/2013   Pneumococcal Polysaccharide-23 07/14/2008   Td 07/14/2008, 01/20/2019   Zoster, Live 06/03/2010    Screening Tests Health Maintenance  Topic Date Due   COVID-19 Vaccine (3 - Pfizer risk series) 10/13/2019   INFLUENZA VACCINE  09/01/2023 (Originally 01/02/2023)   Zoster Vaccines- Shingrix (1 of 2) 09/04/2023 (Originally 02/04/1949)   Medicare Annual Wellness (AWV)  08/11/2024   DTaP/Tdap/Td (3 - Tdap) 01/19/2029   Pneumonia Vaccine 37+ Years old  Completed   HPV VACCINES  Aged Out    Health Maintenance  Health Maintenance Due  Topic Date Due   COVID-19 Vaccine (3 - Pfizer risk series) 10/13/2019   Health Maintenance Items Addressed: Per patient - had both Shingles vaccines at [facility] years ago. Declines Flu shots and doesn't want any more Covid vaccines.  Additional Screening:  Vision Screening: Recommended annual ophthalmology exams for early detection of glaucoma and other disorders of the eye.  Dental Screening: Recommended annual dental exams for proper oral hygiene  Community Resource Referral / Chronic Care Management: CRR required this visit?  No   CCM required this visit?  No     Plan:     I have personally reviewed and noted  the following in the patient's chart:   Medical and social history Use of alcohol, tobacco or illicit drugs  Current medications and supplements including opioid prescriptions. Patient is not currently taking opioid prescriptions. Functional ability and status Nutritional status Physical activity Advanced directives List of other physicians Hospitalizations, surgeries, and ER visits in previous 12  months Vitals Screenings to include cognitive, depression, and falls Referrals and appointments  In addition, I have reviewed and discussed with patient certain preventive protocols, quality metrics, and best practice recommendations. A written personalized care plan for preventive services as well as general preventive health recommendations were provided to patient.     Aja Whitehair E Tahmir Kleckner, LPN   07/23/2540   After Visit Summary: (MyChart) Due to this being a telephonic visit, the after visit summary with patients personalized plan was offered to patient via MyChart   Notes: Nothing significant to report at this time.

## 2023-08-12 NOTE — Patient Instructions (Signed)
 Roy Palmer , Thank you for taking time to come for your Medicare Wellness Visit. I appreciate your ongoing commitment to your health goals. Please review the following plan we discussed and let me know if I can assist you in the future.   Referrals/Orders/Follow-Ups/Clinician Recommendations: Keep up the great work!  This is a list of the screening recommended for you and due dates:  Health Maintenance  Topic Date Due   COVID-19 Vaccine (3 - Pfizer risk series) 10/13/2019   Flu Shot  09/01/2023*   Zoster (Shingles) Vaccine (1 of 2) 09/04/2023*   Medicare Annual Wellness Visit  08/11/2024   DTaP/Tdap/Td vaccine (3 - Tdap) 01/19/2029   Pneumonia Vaccine  Completed   HPV Vaccine  Aged Out  *Topic was postponed. The date shown is not the original due date.    Advanced directives: (Declined) Advance directive discussed with you today. Even though you declined this today, please call our office should you change your mind, and we can give you the proper paperwork for you to fill out.  Next Medicare Annual Wellness Visit scheduled for next year: Yes

## 2023-09-15 ENCOUNTER — Ambulatory Visit (INDEPENDENT_AMBULATORY_CARE_PROVIDER_SITE_OTHER): Payer: Medicare Other

## 2023-09-15 DIAGNOSIS — I472 Ventricular tachycardia, unspecified: Secondary | ICD-10-CM | POA: Diagnosis not present

## 2023-09-15 DIAGNOSIS — I428 Other cardiomyopathies: Secondary | ICD-10-CM

## 2023-09-15 LAB — CUP PACEART REMOTE DEVICE CHECK
Battery Remaining Longevity: 11 mo
Battery Voltage: 2.87 V
Brady Statistic AP VP Percent: 92.56 %
Brady Statistic AP VS Percent: 0.04 %
Brady Statistic AS VP Percent: 7.36 %
Brady Statistic AS VS Percent: 0.05 %
Brady Statistic RA Percent Paced: 92.51 %
Brady Statistic RV Percent Paced: 99.8 %
Date Time Interrogation Session: 20250414001706
HighPow Impedance: 67 Ohm
Implantable Lead Connection Status: 753985
Implantable Lead Connection Status: 753985
Implantable Lead Implant Date: 20171025
Implantable Lead Implant Date: 20171025
Implantable Lead Location: 753859
Implantable Lead Location: 753860
Implantable Lead Model: 5076
Implantable Pulse Generator Implant Date: 20171025
Lead Channel Impedance Value: 342 Ohm
Lead Channel Impedance Value: 342 Ohm
Lead Channel Impedance Value: 399 Ohm
Lead Channel Pacing Threshold Amplitude: 0.625 V
Lead Channel Pacing Threshold Amplitude: 0.625 V
Lead Channel Pacing Threshold Pulse Width: 0.4 ms
Lead Channel Pacing Threshold Pulse Width: 0.4 ms
Lead Channel Sensing Intrinsic Amplitude: 1.25 mV
Lead Channel Sensing Intrinsic Amplitude: 1.25 mV
Lead Channel Sensing Intrinsic Amplitude: 11.625 mV
Lead Channel Sensing Intrinsic Amplitude: 15.75 mV
Lead Channel Setting Pacing Amplitude: 2 V
Lead Channel Setting Pacing Amplitude: 2.5 V
Lead Channel Setting Pacing Pulse Width: 0.4 ms
Lead Channel Setting Sensing Sensitivity: 0.3 mV
Zone Setting Status: 755011

## 2023-09-16 ENCOUNTER — Encounter: Payer: Self-pay | Admitting: Internal Medicine

## 2023-11-05 NOTE — Progress Notes (Signed)
 Remote ICD transmission.

## 2023-11-09 ENCOUNTER — Other Ambulatory Visit: Payer: Self-pay | Admitting: Internal Medicine

## 2023-11-09 DIAGNOSIS — I428 Other cardiomyopathies: Secondary | ICD-10-CM

## 2023-11-09 DIAGNOSIS — I5022 Chronic systolic (congestive) heart failure: Secondary | ICD-10-CM

## 2023-11-09 DIAGNOSIS — I472 Ventricular tachycardia, unspecified: Secondary | ICD-10-CM

## 2023-11-09 DIAGNOSIS — Z95810 Presence of automatic (implantable) cardiac defibrillator: Secondary | ICD-10-CM

## 2023-11-11 ENCOUNTER — Telehealth: Payer: Self-pay | Admitting: Internal Medicine

## 2023-11-11 NOTE — Telephone Encounter (Signed)
 Pt's medication was already sent to pt's pharmacy as requested. Confirmation received.

## 2023-11-11 NOTE — Telephone Encounter (Signed)
*  STAT* If patient is at the pharmacy, call can be transferred to refill team.   1. Which medications need to be refilled? (please list name of each medication and dose if known)  amiodarone  (PACERONE ) 200 MG tablet    2. Would you like to learn more about the convenience, safety, & potential cost savings by using the [facility]? N/A   3. Are you open to using the [facility] (Type [facility]. N/A   4. Which pharmacy/location (including street and city if local pharmacy) is medication to be sent to? [id number] - [place], [place] - [address] AT [facility] OF [address]    5. Do they need a 30 day or 90 day supply? 90 day

## 2023-12-14 NOTE — Patient Instructions (Signed)
 Chronic Kidney Disease: Eating Plan Chronic kidney disease (CKD) is when your kidneys aren't working well. They can't remove waste, fluids, and other substances from your blood. When these substances build up, they can worsen kidney damage and affect your health. Eating certain foods can lead to a buildup of these substances. Changing your diet can help prevent more kidney damage. Diet changes may also delay dialysis or even keep you from needing it. What nutrients should I limit? Work with your health care team and an expert in healthy eating (dietitian) to make a meal plan that's right for you. Foods you can eat and foods you should limit or avoid will depend on: The stage of your kidney disease. Any other conditions you have. The items listed below are not a complete list. Talk with a dietitian to learn what's best for you. Potassium Potassium affects how well your heart beats. Too much potassium in your blood can cause an irregular heartbeat or even a heart attack. You may need to limit foods that are high in potassium, such as: Liquid milk and soy milk. Salt substitutes that contain potassium. Fruits like: Bananas. Apricots. Melon. Prunes and raisins. Kiwi. Nectarines and oranges. Vegetables, such as: Potatoes, sweet potatoes, and yams. Tomatoes. Leafy greens. Beets. Avocado. Pumpkin and winter squash. Beans, like lima beans. Nuts. Phosphorus Phosphorus is a mineral found in your bones. You need a balance between calcium and phosphorus to build and maintain healthy bones.  Too much added phosphorus from the foods you eat can pull calcium from your bones. Losing calcium can make your bones weak and more likely to break. Too much phosphorus can also make your skin itch. You may need to limit foods that are high in phosphorus or that have added phosphorus, such as: Liquid milk and dairy products. Dark-colored sodas or soft drinks. Bran cereals and oatmeal. Protein  Protein  helps your body make and keep muscle. Protein also helps to repair your body's cells and tissues.  One of the natural breakdown products of protein is a waste product called urea. When your kidneys aren't working well, they can't remove waste like urea. Reducing protein in your diet can help keep urea from building up in your blood. Depending on your stage of kidney disease, you may need to eat smaller portions of foods that are high in protein. Sources of animal protein include: Meat (all types). Fish and seafood. Poultry. Eggs. Dairy. Other protein foods include: Beans and legumes. Nuts and nut butter. Soy, like tofu.  Sodium Salt (sodium) helps to keep a healthy balance of fluids in your body. Too much salt can increase your blood pressure, which can harm your heart and lungs. Extra salt can also cause your body to keep too much fluid, making your kidneys work harder. You may need to limit or avoid foods that are high in salt, such as: Salt seasonings. Soy and teriyaki sauce. Meats that are: Packaged. Precooked. Cured. Processed. Salted crackers and snack foods. Fast food. Canned soups and foods. Pickled foods. Boxed mixes or ready-to-eat boxed meals and side dishes. Bottled dressings, sauces, and marinades. Talk with your dietitian about how much potassium, phosphorus, protein, and salt you may have each day. What are tips for following this plan? Reading food labels  Check the amount of salt in foods. Limit foods that have salt listed among the first five ingredients. Try to eat low-salt foods. Check the ingredient list for added phosphorus or potassium. "Phos" in an ingredient is a sign that  phosphorus has been added. Do not buy foods that are calcium-enriched or that have calcium added to them (fortified). Buy canned vegetables and beans that say "no salt added." Rinse them before eating. Lifestyle Limit the amount of protein you eat from animal sources each day. Focus on  protein from plant sources, like tofu and dried beans, peas, and lentils. Do not add salt to food when cooking or before eating. Do not eat star fruit. It can be toxic for people with kidney problems. Talk with your health care provider before taking any vitamin or mineral supplements. If told by your provider: Track how much liquid you have so you can avoid drinking too much. Try to eat foods that are made mostly from water, like gelatin, ice cream, soups, and juicy fruits and vegetables. If you have diabetes and chronic kidney disease: If you have diabetes and CKD, you need to keep your blood sugar (glucose) in the target range recommended by your provider. Follow your diabetes management plan. This may include: Checking your blood glucose regularly. Taking medicines by mouth, or taking insulin, or both. Exercising for at least 30 minutes on 5 or more days each week, or as told by your provider. Tracking how many servings of carbohydrates you eat at each meal. Not using orange juice to treat low blood sugars. Instead, use apple juice, cranberry juice, or clear soda. You may be given guidelines on what foods and nutrients you may eat, and how much you can have each day. This depends on your stage of kidney disease and whether you have high blood pressure. Follow the meal plan your dietitian gives you. Where to find more information [employer] of Diabetes and Digestive and Kidney Diseases: StageSync.si National Kidney Foundation: kidney.org This information is not intended to replace advice given to you by your health care provider. Make sure you discuss any questions you have with your health care provider. Document Revised: 12/31/2022 Document Reviewed: 12/31/2022 Elsevier Patient Education  2024 [employer].

## 2023-12-15 ENCOUNTER — Ambulatory Visit: Payer: Medicare Other

## 2023-12-15 DIAGNOSIS — I472 Ventricular tachycardia, unspecified: Secondary | ICD-10-CM | POA: Diagnosis not present

## 2023-12-15 LAB — CUP PACEART REMOTE DEVICE CHECK
Battery Remaining Longevity: 8 mo
Battery Voltage: 2.85 V
Brady Statistic AP VP Percent: 93.66 %
Brady Statistic AP VS Percent: 0.3 %
Brady Statistic AS VP Percent: 5.93 %
Brady Statistic AS VS Percent: 0.11 %
Brady Statistic RA Percent Paced: 93.79 %
Brady Statistic RV Percent Paced: 99.38 %
Date Time Interrogation Session: 20250714033325
HighPow Impedance: 68 Ohm
Implantable Lead Connection Status: 753985
Implantable Lead Connection Status: 753985
Implantable Lead Implant Date: 20171025
Implantable Lead Implant Date: 20171025
Implantable Lead Location: 753859
Implantable Lead Location: 753860
Implantable Lead Model: 5076
Implantable Pulse Generator Implant Date: 20171025
Lead Channel Impedance Value: 342 Ohm
Lead Channel Impedance Value: 361 Ohm
Lead Channel Impedance Value: 399 Ohm
Lead Channel Pacing Threshold Amplitude: 0.625 V
Lead Channel Pacing Threshold Amplitude: 0.625 V
Lead Channel Pacing Threshold Pulse Width: 0.4 ms
Lead Channel Pacing Threshold Pulse Width: 0.4 ms
Lead Channel Sensing Intrinsic Amplitude: 1 mV
Lead Channel Sensing Intrinsic Amplitude: 1 mV
Lead Channel Sensing Intrinsic Amplitude: 11.625 mV
Lead Channel Sensing Intrinsic Amplitude: 15.75 mV
Lead Channel Setting Pacing Amplitude: 2 V
Lead Channel Setting Pacing Amplitude: 2.5 V
Lead Channel Setting Pacing Pulse Width: 0.4 ms
Lead Channel Setting Sensing Sensitivity: 0.3 mV
Zone Setting Status: 755011

## 2023-12-17 ENCOUNTER — Encounter: Payer: Self-pay | Admitting: Nurse Practitioner

## 2023-12-17 ENCOUNTER — Ambulatory Visit: Payer: Self-pay | Admitting: Internal Medicine

## 2023-12-17 ENCOUNTER — Ambulatory Visit: Payer: Self-pay | Admitting: Nurse Practitioner

## 2023-12-17 VITALS — BP 119/65 | HR 69 | Temp 97.6°F | Ht 70.0 in | Wt 178.0 lb

## 2023-12-17 DIAGNOSIS — I5022 Chronic systolic (congestive) heart failure: Secondary | ICD-10-CM

## 2023-12-17 DIAGNOSIS — I428 Other cardiomyopathies: Secondary | ICD-10-CM

## 2023-12-17 DIAGNOSIS — I7123 Aneurysm of the descending thoracic aorta, without rupture: Secondary | ICD-10-CM | POA: Diagnosis not present

## 2023-12-17 DIAGNOSIS — N1832 Chronic kidney disease, stage 3b: Secondary | ICD-10-CM

## 2023-12-17 DIAGNOSIS — R7301 Impaired fasting glucose: Secondary | ICD-10-CM

## 2023-12-17 DIAGNOSIS — E782 Mixed hyperlipidemia: Secondary | ICD-10-CM

## 2023-12-17 DIAGNOSIS — I38 Endocarditis, valve unspecified: Secondary | ICD-10-CM

## 2023-12-17 DIAGNOSIS — N183 Chronic kidney disease, stage 3 unspecified: Secondary | ICD-10-CM

## 2023-12-17 DIAGNOSIS — Z95810 Presence of automatic (implantable) cardiac defibrillator: Secondary | ICD-10-CM

## 2023-12-17 DIAGNOSIS — I130 Hypertensive heart and chronic kidney disease with heart failure and stage 1 through stage 4 chronic kidney disease, or unspecified chronic kidney disease: Secondary | ICD-10-CM | POA: Diagnosis not present

## 2023-12-17 LAB — BAYER DCA HB A1C WAIVED: HB A1C (BAYER DCA - WAIVED): 5.9 % — ABNORMAL HIGH (ref 4.8–5.6)

## 2023-12-17 NOTE — Assessment & Plan Note (Signed)
 A1c 5.9% today.  Continue diet focus at home.  Goal <8% due to age.

## 2023-12-17 NOTE — Assessment & Plan Note (Signed)
 Chronic, ongoing.  Continue to monitor, labs obtained today.  Return to nephrology as needed in future.  Avoid Ibuprofen products and ensure good water intake at home.

## 2023-12-17 NOTE — Assessment & Plan Note (Signed)
Chronic, no statin use, refuses.  With advanced age this is appropriate, will continue to monitor and focus on diet. 

## 2023-12-17 NOTE — Assessment & Plan Note (Signed)
Ongoing and stable on imaging 2021.  Discussed need for repeat imaging with him, he declines at this time.  Continue good BP control.  Refuses statin. 

## 2023-12-17 NOTE — Assessment & Plan Note (Signed)
 Chronic, ongoing.  BP remains below goal for age.  Continue current medication regimen and collaboration with cardiology. Recommend he monitor BP at least a few mornings a week at home and document.  DASH diet at home.  Continue current medication regimen and adjust as needed.  Labs today: CMP, CBC.  Return in 6 months per patient preference.

## 2023-12-17 NOTE — Progress Notes (Signed)
 BP 119/65   Pulse 69   Temp 97.6 F (36.4 C) (Oral)   Ht 5' 10 (1.778 m)   Wt 178 lb (80.7 kg)   SpO2 98%   BMI 25.54 kg/m    Subjective:    Patient ID: Roy Palmer, male    DOB: Apr 02, 1930, 89 y.o.   MRN: [id number]  HPI: Roy Palmer is a 89 y.o. male  Chief Complaint  Patient presents with   Chronic Kidney Disease   Hyperlipidemia   Hypertension   Lives with wife of 72 years (anniversary in June).  HYPERTENSION / HYPERLIPIDEMIA WITH CHF Intolerant to all classes HF therapy.  Takes Pacerone . Last saw cardiology 04/07/23 with no changes.  09/06/21 EF was 35-40%. History of v tach, ICD in place, checks with cardiology.  Last pacemaker check was 12/17/23.  Continues to be very active at home, including hunting deer.  Thoracic aortic aneurysm seen on 2017 CT measuring 3.5 AP at that time. Discussed risks and benefits of low dose CT for monitoring- does not wish to obtain at this time, last check was 10/22/19 - 4 cm. Not taking statin, refuses to start. Satisfied with current treatment? yes Duration of hypertension: chronic BP monitoring frequency: every month BP range: <120-130/80 on average BP medication side effects: no Duration of hyperlipidemia: chronic Cholesterol medication side effects: no Cholesterol supplements: none Aspirin : yes Recent stressors: no Recurrent headaches: no Visual changes: no Palpitations: no Dyspnea: no Chest pain: no Lower extremity edema: no Dizzy/lightheaded: no  The ASCVD Risk score (Arnett DK, et al., 2019) failed to calculate for the following reasons:   The 2019 ASCVD risk score is only valid for ages 56 to 15  Impaired Fasting Glucose HbA1C:  Lab Results  Component Value Date   HGBA1C 6.0 (H) 06/11/2023  Duration of elevated blood sugar: years Polydipsia: no Polyuria: no Weight change: no Visual disturbance: no Glucose Monitoring: no    Accucheck frequency: Not Checking    Fasting glucose:     Post prandial:  Diabetic  Education: Not Completed Family history of diabetes: yes   CHRONIC KIDNEY DISEASE (CKD 3b) CKD status: stable Medications renally dose: no Previous renal evaluation: yes Pneumovax:  Up to Date Influenza Vaccine:  Up to Date    Relevant past medical, surgical, family and social history reviewed and updated as indicated. Interim medical history since our last visit reviewed. Allergies and medications reviewed and updated.  Review of Systems  Constitutional:  Negative for activity change, diaphoresis, fatigue and fever.  Respiratory:  Negative for cough, chest tightness, shortness of breath and wheezing.   Cardiovascular:  Negative for chest pain, palpitations and leg swelling.  Gastrointestinal: Negative.   Neurological: Negative.   Psychiatric/Behavioral: Negative.     Per HPI unless specifically indicated above     Objective:    BP 119/65   Pulse 69   Temp 97.6 F (36.4 C) (Oral)   Ht 5' 10 (1.778 m)   Wt 178 lb (80.7 kg)   SpO2 98%   BMI 25.54 kg/m   Wt Readings from Last 3 Encounters:  12/17/23 178 lb (80.7 kg)  08/12/23 181 lb (82.1 kg)  06/11/23 181 lb (82.1 kg)    Physical Exam Vitals and nursing note reviewed.  Constitutional:      General: He is awake. He is not in acute distress.    Appearance: He is well-developed and well-groomed. He is not ill-appearing or toxic-appearing.  HENT:     Head: Normocephalic.  Right Ear: Hearing and external ear normal.     Left Ear: Hearing and external ear normal.  Eyes:     General: Lids are normal.     Extraocular Movements: Extraocular movements intact.     Conjunctiva/sclera: Conjunctivae normal.  Neck:     Thyroid : No thyromegaly.     Vascular: No carotid bruit.  Cardiovascular:     Rate and Rhythm: Normal rate and regular rhythm.     Heart sounds: Normal heart sounds. No murmur heard.    No gallop.  Pulmonary:     Effort: No accessory muscle usage or respiratory distress.     Breath sounds: Normal breath  sounds.  Abdominal:     General: Bowel sounds are normal. There is no distension.     Palpations: Abdomen is soft.     Tenderness: There is no abdominal tenderness.  Musculoskeletal:     Cervical back: Full passive range of motion without pain.     Right lower leg: No edema.     Left lower leg: No edema.  Lymphadenopathy:     Cervical: No cervical adenopathy.  Skin:    General: Skin is warm.     Capillary Refill: Capillary refill takes less than 2 seconds.  Neurological:     Mental Status: He is alert and oriented to person, place, and time.     Deep Tendon Reflexes: Reflexes are normal and symmetric.     Reflex Scores:      Brachioradialis reflexes are 2+ on the right side and 2+ on the left side.      Patellar reflexes are 2+ on the right side and 2+ on the left side.    Comments: Mild droop of right eyelid, but able to open eye.   Psychiatric:        Attention and Perception: Attention normal.        Mood and Affect: Mood normal.        Speech: Speech normal.        Behavior: Behavior normal. Behavior is cooperative.        Thought Content: Thought content normal.    Results for orders placed or performed in visit on 12/15/23  CUP PACEART REMOTE DEVICE CHECK   Collection Time: 12/15/23  3:33 AM  Result Value Ref Range   Date Time Interrogation Session [id number]    Pulse Generator Manufacturer MERM    Pulse Gen Model [id number] Evera MRI XT DR    Pulse Gen Serial Number [id number]    Clinic Name [facility]    Implantable Pulse Generator Type Implantable Cardiac Defibulator    Implantable Pulse Generator Implant Date 79828974    Implantable Lead Manufacturer MERM    Implantable Lead Model 5076 CapSureFix Novus MRI SureScan    Implantable Lead Serial Number [id number]    Implantable Lead Implant Date 79828974    Implantable Lead Location Detail 1 APPENDAGE    Implantable Lead Location [id number]    Implantable Lead Connection Status [id number]    Implantable Lead  Manufacturer [facility]    Implantable Lead Model [id number] [id number] Sprint Quattro Secure S MRI SureScan    Implantable Lead Serial Number [id number]    Implantable Lead Implant Date 79828974    Implantable Lead Location Detail 1 APEX    Implantable Lead Location [id number]    Implantable Lead Connection Status [id number]    Lead Channel Setting Sensing Sensitivity 0.3 mV   Lead Channel Setting Pacing Amplitude 2 V   Lead Channel Setting Pacing Pulse  Width 0.4 ms   Lead Channel Setting Pacing Amplitude 2.5 V   Zone Setting Status Active    Zone Setting Status Inactive    Zone Setting Status Active    Zone Setting Status Inactive    Zone Setting Status [id number]    Lead Channel Impedance Value 361 ohm   Lead Channel Sensing Intrinsic Amplitude 1 mV   Lead Channel Sensing Intrinsic Amplitude 1 mV   Lead Channel Pacing Threshold Amplitude 0.625 V   Lead Channel Pacing Threshold Pulse Width 0.4 ms   Lead Channel Impedance Value 399 ohm   Lead Channel Impedance Value 342 ohm   Lead Channel Sensing Intrinsic Amplitude 15.75 mV   Lead Channel Sensing Intrinsic Amplitude 11.625 mV   Lead Channel Pacing Threshold Amplitude 0.625 V   Lead Channel Pacing Threshold Pulse Width 0.4 ms   HighPow Impedance 68 ohm   Battery Status OK    Battery Remaining Longevity 8 mo   Battery Voltage 2.85 V   Brady Statistic RA Percent Paced 93.79 %   Brady Statistic RV Percent Paced 99.38 %   Brady Statistic AP VP Percent 93.66 %   Brady Statistic AS VP Percent 5.93 %   Brady Statistic AP VS Percent 0.3 %   Brady Statistic AS VS Percent 0.11 %      Assessment & Plan:   Problem List Items Addressed This Visit       Cardiovascular and Mediastinum   Thoracic aortic aneurysm (HCC)   Ongoing and stable on imaging 2021.  Discussed need for repeat imaging with him, he declines at this time.  Continue good BP control.  Refuses statin.      Nonischemic cardiomyopathy (HCC)   Followed by cardiology, continue this collaboration.  Recent notes reviewed.      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  BP remains below goal for age.  Continue current medication regimen and collaboration with cardiology. Recommend he monitor BP at least a few mornings a week at home and document.  DASH diet at home.  Continue current medication regimen and adjust as needed.  Labs today: CMP, CBC.  Return in 6 months per patient preference.       Chronic systolic heart failure due to valvular disease (HCC) - Primary   Chronic, ongoing.  Continue collaboration with cardiology and reviewed recommendations. Euvolemic on exam.  Recent EF 09/06/21 was 35-40%. - Reminded to call for an overnight weight gain of >2 pounds or a weekly weight weight of >5 pounds - not adding salt to his food and has been reading food labels. Reviewed the importance of keeping daily sodium intake to 2000mg  daily  - Avoid Ibuprofen products        Endocrine   IFG (impaired fasting glucose)   A1c 5.9% today.  Continue diet focus at home.  Goal <8% due to age.      Relevant Orders   Bayer DCA Hb A1c Waived     Genitourinary   CKD (chronic kidney disease), stage IIIa   Chronic, ongoing.  Continue to monitor, labs obtained today.  Return to nephrology as needed in future.  Avoid Ibuprofen products and ensure good water intake at home.      Relevant Orders   CBC with Differential/Platelet   Comprehensive metabolic panel with GFR     Other   ICD (implantable cardioverter-defibrillator) in place   Continue collaboration with cardiology for checks.  Hyperlipidemia   Chronic, no statin use, refuses.  With advanced age this is appropriate, will continue to monitor and focus on diet.      Relevant Orders   Lipid Panel w/o Chol/HDL Ratio   Comprehensive metabolic panel with GFR     Follow up plan: Return in about 6 months (around 06/18/2024) for HTN/HLD, CKD, IFG.

## 2023-12-17 NOTE — Assessment & Plan Note (Signed)
Chronic, ongoing.  Continue collaboration with cardiology and reviewed recommendations. Euvolemic on exam.  Recent EF 09/06/21 was 35-40%. - Reminded to call for an overnight weight gain of >2 pounds or a weekly weight weight of >5 pounds - not adding salt to his food and has been reading food labels. Reviewed the importance of keeping daily sodium intake to <2000mg daily  - Avoid Ibuprofen products 

## 2023-12-17 NOTE — Assessment & Plan Note (Signed)
 Followed by cardiology, continue this collaboration. Recent notes reviewed.

## 2023-12-17 NOTE — Assessment & Plan Note (Signed)
Continue collaboration with cardiology for checks. 

## 2023-12-18 ENCOUNTER — Ambulatory Visit: Payer: Self-pay | Admitting: Nurse Practitioner

## 2023-12-18 LAB — COMPREHENSIVE METABOLIC PANEL WITH GFR
ALT: 15 IU/L (ref 0–44)
AST: 15 IU/L (ref 0–40)
Albumin: 4.1 g/dL (ref 3.6–4.6)
Alkaline Phosphatase: 66 IU/L (ref 44–121)
BUN/Creatinine Ratio: 18 (ref 10–24)
BUN: 32 mg/dL (ref 10–36)
Bilirubin Total: 0.3 mg/dL (ref 0.0–1.2)
CO2: 22 mmol/L (ref 20–29)
Calcium: 8.8 mg/dL (ref 8.6–10.2)
Chloride: 101 mmol/L (ref 96–106)
Creatinine, Ser: 1.76 mg/dL — ABNORMAL HIGH (ref 0.76–1.27)
Globulin, Total: 2.3 g/dL (ref 1.5–4.5)
Glucose: 112 mg/dL — ABNORMAL HIGH (ref 70–99)
Potassium: 4.9 mmol/L (ref 3.5–5.2)
Sodium: 137 mmol/L (ref 134–144)
Total Protein: 6.4 g/dL (ref 6.0–8.5)
eGFR: 36 mL/min/1.73 — ABNORMAL LOW

## 2023-12-18 LAB — LIPID PANEL W/O CHOL/HDL RATIO
Cholesterol, Total: 198 mg/dL (ref 100–199)
HDL: 43 mg/dL
LDL Chol Calc (NIH): 110 mg/dL — ABNORMAL HIGH (ref 0–99)
Triglycerides: 258 mg/dL — ABNORMAL HIGH (ref 0–149)
VLDL Cholesterol Cal: 45 mg/dL — ABNORMAL HIGH (ref 5–40)

## 2023-12-18 LAB — CBC WITH DIFFERENTIAL/PLATELET
Basophils Absolute: 0 x10E3/uL (ref 0.0–0.2)
Basos: 1 %
EOS (ABSOLUTE): 0.2 x10E3/uL (ref 0.0–0.4)
Eos: 3 %
Hematocrit: 42.7 % (ref 37.5–51.0)
Hemoglobin: 13.9 g/dL (ref 13.0–17.7)
Immature Grans (Abs): 0 x10E3/uL (ref 0.0–0.1)
Immature Granulocytes: 0 %
Lymphocytes Absolute: 1.4 x10E3/uL (ref 0.7–3.1)
Lymphs: 20 %
MCH: 32.3 pg (ref 26.6–33.0)
MCHC: 32.6 g/dL (ref 31.5–35.7)
MCV: 99 fL — ABNORMAL HIGH (ref 79–97)
Monocytes Absolute: 0.5 x10E3/uL (ref 0.1–0.9)
Monocytes: 8 %
Neutrophils Absolute: 4.6 x10E3/uL (ref 1.4–7.0)
Neutrophils: 68 %
Platelets: 159 x10E3/uL (ref 150–450)
RBC: 4.3 x10E6/uL (ref 4.14–5.80)
RDW: 12.7 % (ref 11.6–15.4)
WBC: 6.8 x10E3/uL (ref 3.4–10.8)

## 2024-03-11 NOTE — Progress Notes (Signed)
 Remote ICD Transmission

## 2024-04-14 ENCOUNTER — Ambulatory Visit: Attending: Student in an Organized Health Care Education/Training Program | Admitting: Internal Medicine

## 2024-04-14 ENCOUNTER — Encounter: Payer: Self-pay | Admitting: Internal Medicine

## 2024-04-14 VITALS — BP 124/60 | HR 70 | Ht 70.0 in | Wt 178.0 lb

## 2024-04-14 DIAGNOSIS — I472 Ventricular tachycardia, unspecified: Secondary | ICD-10-CM | POA: Diagnosis not present

## 2024-04-14 NOTE — Patient Instructions (Addendum)
 Medication Instructions:  Your physician has recommended you make the following change in your medication:  Decrease amiodarone  to 1 tablet daily Monday-Friday. None on Saturday or Sunday.  Lab Work: None ordered.  You may go to any [facility] Location for your lab work:  [facility] - [address] ([facility]) - [phone] [address]  [place] - [address]   [place]  - [address]   [place] - [address] - [address] [place]  - [address] ([facility]   If you have labs (blood work) drawn today and your tests are completely normal, you will receive your results only by: [employer] (if you have MyChart)  If you have any lab test that is abnormal or we need to change your treatment, we will call you or send a MyChart message to review the results.  Testing/Procedures: None ordered.  Follow-Up: At [facility], you and your health needs are our priority.  As part of our continuing mission to provide you with exceptional heart care, we have created designated Provider Care Teams.  These Care Teams include your primary Cardiologist (physician) and Advanced Practice Providers (APPs -  Physician Assistants and Nurse Practitioners) who all work together to provide you with the care you need, when you need it.   Your next appointment:   February 2026  The format for your next appointment:   In Person  Provider:   Donnice Primus, MD or one of the following Advanced Practice Providers on your designated Care Team:   Charlies Arthur, [place] Ozell Jodie Passey, [place] Leotis Barrack, NP  Note: Remote monitoring is used to monitor your Pacemaker/ ICD from home. This monitoring reduces the number of office visits required to check your device to one time per year. It allows us  to keep an eye on the functioning of your  device to ensure it is working properly.

## 2024-04-14 NOTE — Progress Notes (Signed)
 HPI [name] returns today for followup. He has a h/o MR, s/p repair, non-ischemic CM, and CHB. He has VT and we had a difficult time controlling until we got him on amiodarone . He has done well in the interim. A repeat echo at that time demonstrated improved LV function(35% from 25%). He has not had any ICD therapies. He is active fishing and hunting. 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 Allergies  Allergen Reactions   Amiodarone  Other (See Comments)    Terrible dreams/nightmares in high dosing   Lyrica [Pregabalin] Nausea And Vomiting     Current Outpatient Medications  Medication Sig Dispense Refill   acetaminophen  (TYLENOL ) 325 MG tablet Take 325-650 mg by mouth every 6 (six) hours as needed for mild pain, moderate pain or fever.      amiodarone  (PACERONE ) 200 MG tablet TAKE 1 TABLET BY MOUTH EVERY MONDAY THROUGH SATURDAY DO NOT TAKE ON SUNDAY 90 tablet 1   Multiple Vitamins-Minerals (MULTIVITAMIN ADULTS 50+ PO) Take 1 tablet by mouth daily.     No current facility-administered medications for this visit.     Past Medical History:  Diagnosis Date   AAA (abdominal aortic aneurysm) without rupture 03/12/2016   Small - measured 3.3 x 3.5 cm by CTA   CKD (chronic kidney disease) stage 3, GFR 30-59 ml/min (HCC)    Essential hypertension    Hyperlipidemia    Hypertensive kidney disease    Incidental pulmonary nodule 03/12/2016   Vague opacity right lung base requires radiographic follow up - index of suspicion is LOW   Near syncope    Cardiogenic, related to an SVT and severe MR   Non-sustained ventricular tachycardia (HCC)    S/P minimally invasive mitral valve repair 03/19/2016   Complex valvuloplasty including triangular resection of flail segment of posterior leaflet, chordal transposition x1, artificial Gore-tex neochord placement x4 and 34 mm Sorin Memo 3D ring annuloplasty via right mini thoracotomy approach with clipping of LA appendage   Severe  mitral regurgitation by prior echocardiogram 02/23/2016   Confirmed by TEE   Syncope 03/15/2016   Tennis elbow left   Thoracic aortic aneurysm 03/12/2016   Very very mild fusiform enlargement of distal transverse and proximal descending thoracic aorta noted on CTA   Trigeminal neuralgia     ROS:   All systems reviewed and negative except as noted in the HPI.   Past Surgical History:  Procedure Laterality Date   CARDIAC CATHETERIZATION N/A 02/23/2016   Procedure: Right/Left Heart Cath and Coronary Angiography;  Surgeon: Lonni Hanson, MD;  Location: [facility] INVASIVE CV LAB;  Service: Cardiovascular: Angiographic minimal coronary disease. Normal left and right heart Pressures. Decreased CO/CI in settng of frequent PVCs & Severe MR.   CATARACT EXTRACTION, BILATERAL     COLONOSCOPY  08/2005   CYST EXCISION  10/2013   from neck   EP IMPLANTABLE DEVICE N/A 03/27/2016   Procedure: ICD Implant Dual Chamber;  Surgeon: Danelle LELON Birmingham, MD;  Location: [facility] INVASIVE CV LAB;  Service: Cardiovascular;  Laterality: N/A;   ESOPHAGOGASTRODUODENOSCOPY  02/2010   MITRAL VALVE REPAIR Right 03/19/2016   Procedure: MINIMALLY INVASIVE MITRAL VALVE REPAIR (MVR);  Surgeon: Sudie VEAR Laine, MD;  Location: [facility] OR;  Service: Open Heart Surgery;  Laterality: Right;   TEE WITHOUT CARDIOVERSION N/A 02/23/2016   Procedure: TRANSESOPHAGEAL ECHOCARDIOGRAM (TEE);  Surgeon: Wilbert JONELLE Bihari, MD;  Location: [facility] ENDOSCOPY;  Service: Cardiovascular: Normal LV size and function.  Degenerative  mitral valve disease with failed posterior leaflet (P2 segment), Severe MR with pulmonary vein systolic flow reversal. Moderate-TR.     TEE WITHOUT CARDIOVERSION N/A 03/19/2016   Procedure: TRANSESOPHAGEAL ECHOCARDIOGRAM (TEE);  Surgeon: Sudie VEAR Laine, MD;  Location: [facility] OR;  Service: Open Heart Surgery;  Laterality: N/A;   TRANSTHORACIC ECHOCARDIOGRAM  02/14/2016   EF 50-55% with moderate LVH. Possible inferolateral hypokinesis. Moderate-severe  MR with posterior leaflet prolapse, cannot rule out flail. Severe LA dilation. Moderate TR.     Family History  Problem Relation Age of Onset   Hypertension Mother    Stroke Mother    Heart disease Father    Heart attack Father    Dementia Sister    Retinoblastoma Son    COPD Neg Hx    Cancer Neg Hx    Diabetes Neg Hx      Social History   Socioeconomic History   Marital status: Married    Spouse name: Patsy   Number of children: 4   Years of education: Not on file   Highest education level: Associate degree: occupational, scientist, product/process development, or vocational program  Occupational History   Occupation: retired  Tobacco Use   Smoking status: Never   Smokeless tobacco: Never  Vaping Use   Vaping status: Never Used  Substance and Sexual Activity   Alcohol use: No   Drug use: No   Sexual activity: Not Currently  Other Topics Concern   Not on file  Social History Narrative   All live within an hour or so   Social Drivers of Corporate Investment Banker Strain: Low Risk  (08/12/2023)   Overall Financial Resource Strain (CARDIA)    Difficulty of Paying Living Expenses: Not hard at all  Food Insecurity: No Food Insecurity (08/12/2023)   Hunger Vital Sign    Worried About Running Out of Food in the Last Year: Never true    Ran Out of Food in the Last Year: Never true  Transportation Needs: No Transportation Needs (12/02/2022)   PRAPARE - Administrator, Civil Service (Medical): No    Lack of Transportation (Non-Medical): No  Physical Activity: Insufficiently Active (08/12/2023)   Exercise Vital Sign    Days of Exercise per Week: 7 days    Minutes of Exercise per Session: 20 min  Stress: No Stress Concern Present (08/12/2023)   [employer] of Occupational Health - Occupational Stress Questionnaire    Feeling of Stress : Not at all  Social Connections: Moderately Integrated (08/12/2023)   Social Connection and Isolation Panel    Frequency of Communication with  Friends and Family: More than three times a week    Frequency of Social Gatherings with Friends and Family: Twice a week    Attends Religious Services: More than 4 times per year    Active Member of [employer] or Organizations: No    Attends Banker Meetings: Never    Marital Status: Married  Catering Manager Violence: Not At Risk (08/12/2023)   Humiliation, Afraid, Rape, and Kick questionnaire    Fear of Current or Ex-Partner: No    Emotionally Abused: No    Physically Abused: No    Sexually Abused: No     BP 124/60   Pulse 70   Ht 5' 10 (1.778 m)   Wt 178 lb (80.7 kg)   SpO2 97%   BMI 25.54 kg/m   Physical Exam:  Well appearing NAD HEENT: Unremarkable Neck:  No JVD, no thyromegally Lymphatics:  No adenopathy Back:  No CVA tenderness Lungs:  Clear with no wheezes HEART:  Regular rate rhythm, no murmurs, no rubs, no clicks Abd:  soft, positive bowel sounds, no organomegally, no rebound, no guarding Ext:  2 plus pulses, no edema, no cyanosis, no clubbing Skin:  No rashes no nodules Neuro:  CN II through XII intact, motor grossly intact  EKG - P sync ventricular pacing  DEVICE  Normal device function.  See PaceArt for details.   Assess/Plan:  1.VT - he has had no VT since his last visit on amiodarone . He will continue 200 mg daily, and decrease the dose to 5 days a week. 2. Chronic systolic heart failure - his symptoms are class 2. He has refused upgrade to a Biv which is reasonable while his CHF is controlled. He is encouraged to maintain a low sodium diet. I would anticipating adding an LV lead when he reaches ERI in just a few months. He will see Dr. Almetta in 3 months. 3. ICD - his Medtronic DDD ICD is working normally. We will recheck in several months.  4. MR - he has none on exam. He will undergo watchful waiting.    Danelle Blayke Pinera,MD

## 2024-05-18 ENCOUNTER — Encounter: Payer: Self-pay | Admitting: Internal Medicine

## 2024-06-19 NOTE — Patient Instructions (Signed)
 Be Involved in Caring For Your Health:  Taking Medications When medications are taken as directed, they can greatly improve your health. But if they are not taken as prescribed, they may not work. In some cases, not taking them correctly can be harmful. To help ensure your treatment remains effective and safe, understand your medications and how to take them. Bring your medications to each visit for review by your provider.  Your lab results, notes, and after visit summary will be available on My Chart. We strongly encourage you to use this feature. If lab results are abnormal the clinic will contact you with the appropriate steps. If the clinic does not contact you assume the results are satisfactory. You can always view your results on My Chart. If you have questions regarding your health or results, please contact the clinic during office hours. You can also ask questions on My Chart.  We at [facility] are grateful that you chose [place] to provide your care. We strive to provide evidence-based and compassionate care and are always looking for feedback. If you get a survey from the clinic please complete this so we can hear your opinions.  Heart-Healthy Eating Plan Many factors influence your heart health, including eating and exercise habits. Heart health is also called coronary health. Coronary risk increases with abnormal blood fat (lipid) levels. A heart-healthy eating plan includes limiting unhealthy fats, increasing healthy fats, limiting salt (sodium) intake, and making other diet and lifestyle changes. What is my plan? Your health care provider may recommend that: You limit your fat intake to _________% or less of your total calories each day. You limit your saturated fat intake to _________% or less of your total calories each day. You limit the amount of cholesterol in your diet to less than _________ mg per day. You limit the amount of sodium in your diet to less than _________  mg per day. What are tips for following this plan? Cooking Cook foods using methods other than frying. Baking, boiling, grilling, and broiling are all good options. Other ways to reduce fat include: Removing the skin from poultry. Removing all visible fats from meats. Steaming vegetables in water or broth. Meal planning  At meals, imagine dividing your plate into fourths: Fill one-half of your plate with vegetables and green salads. Fill one-fourth of your plate with whole grains. Fill one-fourth of your plate with lean protein foods. Eat 2-4 cups of vegetables per day. One cup of vegetables equals 1 cup (91 g) broccoli or cauliflower florets, 2 medium carrots, 1 large bell pepper, 1 large sweet potato, 1 large tomato, 1 medium white potato, 2 cups (150 g) raw leafy greens. Eat 1-2 cups of fruit per day. One cup of fruit equals 1 small apple, 1 large banana, 1 cup (237 g) mixed fruit, 1 large orange,  cup (82 g) dried fruit, 1 cup (240 mL) 100% fruit juice. Eat more foods that contain soluble fiber. Examples include apples, broccoli, carrots, beans, peas, and barley. Aim to get 25-30 g of fiber per day. Increase your consumption of legumes, nuts, and seeds to 4-5 servings per week. One serving of dried beans or legumes equals  cup (90 g) cooked, 1 serving of nuts is  oz (12 almonds, 24 pistachios, or 7 walnut halves), and 1 serving of seeds equals  oz (8 g). Fats Choose healthy fats more often. Choose monounsaturated and polyunsaturated fats, such as olive and canola oils, avocado oil, flaxseeds, walnuts, almonds, and seeds. Eat  more omega-3 fats. Choose salmon, mackerel, sardines, tuna, flaxseed oil, and ground flaxseeds. Aim to eat fish at least 2 times each week. Check food labels carefully to identify foods with trans fats or high amounts of saturated fat. Limit saturated fats. These are found in animal products, such as meats, butter, and cream. Plant sources of saturated fats  include palm oil, palm kernel oil, and coconut oil. Avoid foods with partially hydrogenated oils in them. These contain trans fats. Examples are stick margarine, some tub margarines, cookies, crackers, and other baked goods. Avoid fried foods. General information Eat more home-cooked food and less restaurant, buffet, and fast food. Limit or avoid alcohol. Limit foods that are high in added sugar and simple starches such as foods made using white refined flour (white breads, pastries, sweets). Lose weight if you are overweight. Losing just 5-10% of your body weight can help your overall health and prevent diseases such as diabetes and heart disease. Monitor your sodium intake, especially if you have high blood pressure. Talk with your health care provider about your sodium intake. Try to incorporate more vegetarian meals weekly. What foods should I eat? Fruits All fresh, canned (in natural juice), or frozen fruits. Vegetables Fresh or frozen vegetables (raw, steamed, roasted, or grilled). Green salads. Grains Most grains. Choose whole wheat and whole grains most of the time. Rice and pasta, including brown rice and pastas made with whole wheat. Meats and other proteins Lean, well-trimmed beef, veal, pork, and lamb. Chicken and [place] without skin. All fish and shellfish. Wild duck, rabbit, pheasant, and venison. Egg whites or low-cholesterol egg substitutes. Dried beans, peas, lentils, and tofu. Seeds and most nuts. Dairy Low-fat or nonfat cheeses, including ricotta and mozzarella. Skim or 1% milk (liquid, powdered, or evaporated). Buttermilk made with low-fat milk. Nonfat or low-fat yogurt. Fats and oils Non-hydrogenated (trans-free) margarines. Vegetable oils, including soybean, sesame, sunflower, olive, avocado, peanut, safflower, corn, canola, and cottonseed. Salad dressings or mayonnaise made with a vegetable oil. Beverages Water (mineral or sparkling). Coffee and tea. Unsweetened ice  tea. Diet beverages. Sweets and desserts Sherbet, gelatin, and fruit ice. Small amounts of dark chocolate. Limit all sweets and desserts. Seasonings and condiments All seasonings and condiments. The items listed above may not be a complete list of foods and beverages you can eat. Contact a dietitian for more options. What foods should I avoid? Fruits Canned fruit in heavy syrup. Fruit in cream or butter sauce. Fried fruit. Limit coconut. Vegetables Vegetables cooked in cheese, cream, or butter sauce. Fried vegetables. Grains Breads made with saturated or trans fats, oils, or whole milk. Croissants. Sweet rolls. Donuts. High-fat crackers, such as cheese crackers and chips. Meats and other proteins Fatty meats, such as hot dogs, ribs, sausage, bacon, rib-eye roast or steak. High-fat deli meats, such as salami and bologna. Caviar. Domestic duck and goose. Organ meats, such as liver. Dairy Cream, sour cream, cream cheese, and creamed cottage cheese. Whole-milk cheeses. Whole or 2% milk (liquid, evaporated, or condensed). Whole buttermilk. Cream sauce or high-fat cheese sauce. Whole-milk yogurt. Fats and oils Meat fat, or shortening. Cocoa butter, hydrogenated oils, palm oil, coconut oil, palm kernel oil. Solid fats and shortenings, including bacon fat, salt pork, lard, and butter. Nondairy cream substitutes. Salad dressings with cheese or sour cream. Beverages Regular sodas and any drinks with added sugar. Sweets and desserts Frosting. Pudding. Cookies. Cakes. Pies. Milk chocolate or white chocolate. Buttered syrups. Full-fat ice cream or ice cream drinks. The items listed above may  not be a complete list of foods and beverages to avoid. Contact a dietitian for more information. Summary Heart-healthy meal planning includes limiting unhealthy fats, increasing healthy fats, limiting salt (sodium) intake and making other diet and lifestyle changes. Lose weight if you are overweight. Losing just  5-10% of your body weight can help your overall health and prevent diseases such as diabetes and heart disease. Focus on eating a balance of foods, including fruits and vegetables, low-fat or nonfat dairy, lean protein, nuts and legumes, whole grains, and heart-healthy oils and fats. This information is not intended to replace advice given to you by your health care provider. Make sure you discuss any questions you have with your health care provider. Document Revised: 06/25/2021 Document Reviewed: 06/25/2021 Elsevier Patient Education  2024 [employer].

## 2024-06-21 ENCOUNTER — Other Ambulatory Visit: Payer: Self-pay | Admitting: Internal Medicine

## 2024-06-21 DIAGNOSIS — I428 Other cardiomyopathies: Secondary | ICD-10-CM

## 2024-06-21 DIAGNOSIS — Z95810 Presence of automatic (implantable) cardiac defibrillator: Secondary | ICD-10-CM

## 2024-06-21 DIAGNOSIS — I472 Ventricular tachycardia, unspecified: Secondary | ICD-10-CM

## 2024-06-21 DIAGNOSIS — I5022 Chronic systolic (congestive) heart failure: Secondary | ICD-10-CM

## 2024-06-23 ENCOUNTER — Encounter: Payer: Self-pay | Admitting: Nurse Practitioner

## 2024-06-23 ENCOUNTER — Ambulatory Visit: Admitting: Nurse Practitioner

## 2024-06-23 VITALS — BP 115/60 | HR 66 | Temp 97.6°F | Ht 70.0 in | Wt 178.0 lb

## 2024-06-23 DIAGNOSIS — N1832 Chronic kidney disease, stage 3b: Secondary | ICD-10-CM | POA: Diagnosis not present

## 2024-06-23 DIAGNOSIS — N183 Chronic kidney disease, stage 3 unspecified: Secondary | ICD-10-CM

## 2024-06-23 DIAGNOSIS — H6121 Impacted cerumen, right ear: Secondary | ICD-10-CM | POA: Diagnosis not present

## 2024-06-23 DIAGNOSIS — I38 Endocarditis, valve unspecified: Secondary | ICD-10-CM

## 2024-06-23 DIAGNOSIS — Z95810 Presence of automatic (implantable) cardiac defibrillator: Secondary | ICD-10-CM | POA: Diagnosis not present

## 2024-06-23 DIAGNOSIS — I7123 Aneurysm of the descending thoracic aorta, without rupture: Secondary | ICD-10-CM | POA: Diagnosis not present

## 2024-06-23 DIAGNOSIS — E782 Mixed hyperlipidemia: Secondary | ICD-10-CM | POA: Diagnosis not present

## 2024-06-23 DIAGNOSIS — I5022 Chronic systolic (congestive) heart failure: Secondary | ICD-10-CM | POA: Diagnosis not present

## 2024-06-23 DIAGNOSIS — R7301 Impaired fasting glucose: Secondary | ICD-10-CM | POA: Diagnosis not present

## 2024-06-23 DIAGNOSIS — I130 Hypertensive heart and chronic kidney disease with heart failure and stage 1 through stage 4 chronic kidney disease, or unspecified chronic kidney disease: Secondary | ICD-10-CM

## 2024-06-23 DIAGNOSIS — I428 Other cardiomyopathies: Secondary | ICD-10-CM

## 2024-06-23 LAB — MICROALBUMIN, URINE WAIVED
Creatinine, Urine Waived: 200 mg/dL (ref 10–300)
Microalb, Ur Waived: 30 mg/L — ABNORMAL HIGH (ref 0–19)
Microalb/Creat Ratio: <30 mg/g

## 2024-06-23 LAB — BAYER DCA HB A1C WAIVED: HB A1C (BAYER DCA - WAIVED): 5.9 % — ABNORMAL HIGH (ref 4.8–5.6)

## 2024-06-23 NOTE — Progress Notes (Signed)
 "  BP 115/60   Pulse 66   Temp 97.6 F (36.4 C) (Oral)   Ht 5' 10 (1.778 m)   Wt 178 lb (80.7 kg)   SpO2 94%   BMI 25.54 kg/m    Subjective:    Patient ID: Roy Palmer, male    DOB: April 13, 1930, 89 y.o.   MRN: [id number]  HPI: Roy Palmer is a 89 y.o. male  Chief Complaint  Patient presents with   Chronic Kidney Disease   Hyperlipidemia   Hypertension   IFG   NOTE WRITTEN BY DNP STUDENT.  ASSESSMENT AND PLAN OF CARE REVIEWED WITH STUDENT, AGREE WITH ABOVE FINDINGS AND PLAN.   HYPERTENSION / HYPERLIPIDEMIA Intolerant to all classes HF therapy. Continues Amiodarone . Cardiology last seen 04/14/24 with no changes.  Echo 09/06/21 EF was 35-40%. History of v tach, ICD in place.  Last pacemaker check was 12/17/23. Battery is to be changed next month.   Thoracic aortic aneurysm seen on 2017 CT measuring 3.5 AP at that time. Discussed risks and benefits of low dose CT for monitoring- does not wish to obtain at this time, last check was 10/22/19 - 4 cm. Not taking statin, refuses to start. Satisfied with current treatment? yes Duration of hypertension: no BP monitoring frequency: weekly BP range: systolic 120-130 BP medication side effects: no Duration of hyperlipidemia: no Cholesterol supplements: none Aspirin : no Recent stressors: no Recurrent headaches: no Visual changes: no Palpitations: no Dyspnea: no Chest pain: no Lower extremity edema: no Dizzy/lightheaded: no   Impaired Fasting Glucose HbA1C:  Lab Results  Component Value Date   HGBA1C 5.9 (H) 12/17/2023   Duration of elevated blood sugar:  Polydipsia: no Polyuria: no Weight change: no Visual disturbance: no Glucose Monitoring: no    Accucheck frequency: Not Checking    Fasting glucose:     Post prandial:  Diabetic Education: Not Completed Family history of diabetes: no   CHRONIC KIDNEY DISEASE (CKD 3b) CKD status: controlled Medications renally dose: no Previous renal evaluation: no Pneumovax:   unknown Influenza Vaccine:  Not up to Date   Relevant past medical, surgical, family and social history reviewed and updated as indicated. Interim medical history since our last visit reviewed. Allergies and medications reviewed and updated.  Review of Systems  Constitutional:  Negative for activity change, appetite change, diaphoresis, fatigue and fever.  Respiratory:  Negative for cough, chest tightness, shortness of breath and wheezing.   Cardiovascular:  Negative for chest pain, palpitations and leg swelling.  Gastrointestinal: Negative.   Endocrine: Negative for polydipsia, polyphagia and polyuria.  Neurological: Negative.   Psychiatric/Behavioral: Negative.     Per HPI unless specifically indicated above     Objective:    BP 115/60   Pulse 66   Temp 97.6 F (36.4 C) (Oral)   Ht 5' 10 (1.778 m)   Wt 178 lb (80.7 kg)   SpO2 94%   BMI 25.54 kg/m   Wt Readings from Last 3 Encounters:  06/23/24 178 lb (80.7 kg)  04/14/24 178 lb (80.7 kg)  12/17/23 178 lb (80.7 kg)    Physical Exam Constitutional:      General: He is not in acute distress.    Appearance: Normal appearance. He is well-groomed and normal weight. He is not ill-appearing or toxic-appearing.  HENT:     Head: Normocephalic and atraumatic.     Right Ear: Tympanic membrane and external ear normal. Decreased hearing noted. There is impacted cerumen (mild amount present).  Left Ear: Tympanic membrane and external ear normal. Decreased hearing noted. There is no impacted cerumen.     Ears:     Comments: Partial cerumen in R ear    Nose: Nose normal.     Mouth/Throat:     Mouth: Mucous membranes are moist.     Pharynx: Oropharynx is clear.  Eyes:     General: Lids are normal. Vision grossly intact.     Extraocular Movements: Extraocular movements intact.     Pupils: Pupils are equal, round, and reactive to light.  Cardiovascular:     Rate and Rhythm: Normal rate and regular rhythm.     Heart sounds:  Normal heart sounds. No murmur heard.    No systolic murmur is present.     No gallop.     Comments: Patient has upcoming appointment for pacemaker battery replacement  Pulmonary:     Effort: Pulmonary effort is normal.     Breath sounds: Normal breath sounds. No decreased breath sounds, wheezing or rales.  Abdominal:     General: Bowel sounds are normal. There is no distension.     Palpations: Abdomen is soft.     Tenderness: There is no abdominal tenderness.  Musculoskeletal:        General: Normal range of motion.     Cervical back: Normal range of motion and neck supple.     Right lower leg: No edema.     Left lower leg: No edema.  Skin:    General: Skin is warm and dry.  Neurological:     General: No focal deficit present.     Mental Status: He is alert and oriented to person, place, and time.     Deep Tendon Reflexes:     Reflex Scores:      Brachioradialis reflexes are 2+ on the right side and 2+ on the left side. Psychiatric:        Attention and Perception: Attention normal.        Mood and Affect: Mood normal.        Speech: Speech normal.        Behavior: Behavior normal.        Thought Content: Thought content normal.     Results for orders placed or performed in visit on 12/17/23  Bayer DCA Hb A1c Waived   Collection Time: 12/17/23  2:10 PM  Result Value Ref Range   HB A1C (BAYER DCA - WAIVED) 5.9 (H) 4.8 - 5.6 %  Lipid Panel w/o Chol/HDL Ratio   Collection Time: 12/17/23  2:11 PM  Result Value Ref Range   Cholesterol, Total 198 100 - 199 mg/dL   Triglycerides 741 (H) 0 - 149 mg/dL   HDL 43 >60 mg/dL   VLDL Cholesterol Cal 45 (H) 5 - 40 mg/dL   LDL Chol Calc (NIH) 889 (H) 0 - 99 mg/dL  CBC with Differential/Platelet   Collection Time: 12/17/23  2:11 PM  Result Value Ref Range   WBC 6.8 3.4 - 10.8 x10E3/uL   RBC 4.30 4.14 - 5.80 x10E6/uL   Hemoglobin 13.9 13.0 - 17.7 g/dL   Hematocrit 57.2 62.4 - 51.0 %   MCV 99 (H) 79 - 97 fL   MCH 32.3 26.6 - 33.0  pg   MCHC 32.6 31.5 - 35.7 g/dL   RDW 87.2 88.3 - 84.5 %   Platelets 159 150 - 450 x10E3/uL   Neutrophils 68 Not Estab. %   Lymphs 20 Not Estab. %  Monocytes 8 Not Estab. %   Eos 3 Not Estab. %   Basos 1 Not Estab. %   Neutrophils Absolute 4.6 1.4 - 7.0 x10E3/uL   Lymphocytes Absolute 1.4 0.7 - 3.1 x10E3/uL   Monocytes Absolute 0.5 0.1 - 0.9 x10E3/uL   EOS (ABSOLUTE) 0.2 0.0 - 0.4 x10E3/uL   Basophils Absolute 0.0 0.0 - 0.2 x10E3/uL   Immature Granulocytes 0 Not Estab. %   Immature Grans (Abs) 0.0 0.0 - 0.1 x10E3/uL  Comprehensive metabolic panel with GFR   Collection Time: 12/17/23  2:11 PM  Result Value Ref Range   Glucose 112 (H) 70 - 99 mg/dL   BUN 32 10 - 36 mg/dL   Creatinine, Ser 8.23 (H) 0.76 - 1.27 mg/dL   eGFR 36 (L) >40 fO/fpw/8.26   BUN/Creatinine Ratio 18 10 - 24   Sodium 137 134 - 144 mmol/L   Potassium 4.9 3.5 - 5.2 mmol/L   Chloride 101 96 - 106 mmol/L   CO2 22 20 - 29 mmol/L   Calcium  8.8 8.6 - 10.2 mg/dL   Total Protein 6.4 6.0 - 8.5 g/dL   Albumin  4.1 3.6 - 4.6 g/dL   Globulin, Total 2.3 1.5 - 4.5 g/dL   Bilirubin Total 0.3 0.0 - 1.2 mg/dL   Alkaline Phosphatase 66 44 - 121 IU/L   AST 15 0 - 40 IU/L   ALT 15 0 - 44 IU/L      Assessment & Plan:   Problem List Items Addressed This Visit       Cardiovascular and Mediastinum   Thoracic aortic aneurysm   Patient denied any chest pain and monitor BP at home. Patient is satisfied with current management and continue to collaborate with cardiology. We will keep monitoring for symptoms      Nonischemic cardiomyopathy (HCC) - Primary   Followed by cardiology, continue this collaboration. Recent notes reviewed.      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  BP remains below goal for age.  Continue current medication regimen and collaboration with cardiology. Recommend he monitor BP at least a few mornings a week at home and document.  DASH diet at  home.  Labs today: CMP, urine ALB.         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 Euvolemic. Continue current medication and collaboration with cardiology. - Reminded to call for an overnight weight gain of >2 pounds or a weekly weight gain of >5 pounds - not adding salt to food and read food labels. Reviewed the importance of keeping daily sodium intake to 2000mg  daily.  - No Ibuprofen use        Endocrine   IFG (impaired fasting glucose)   A1C remains 5.9, stable. Treatment will continue with diet management and lab monitoring      Relevant Orders   Bayer DCA Hb A1c Waived   Microalbumin, Urine Waived     Genitourinary   CKD (chronic kidney disease), stage IIIa   Chronic, stable. Continue focus on healthy style. Avoid Ibuprofen products. Will send to nephrology if worsening function. LABS: CMP, urine ALB.      Relevant Orders   Microalbumin, Urine Waived     Other   ICD (implantable cardioverter-defibrillator) in place   Patient has upcoming appointment with cardiology for pacemaker battery change, patient denies palpitation or chest discomfort.  Hyperlipidemia   Chronic, no statin use, refuses.  With advanced age this is appropriate, will continue to monitor and focus on diet.      Relevant Orders   Comprehensive metabolic panel with GFR   Lipid Panel w/o Chol/HDL Ratio   Other Visit Diagnoses       Impacted cerumen of right ear       To right ear, will have staff irrigate at next visit.        Follow up plan: Return in about 6 months (around 12/21/2024) for HTN/HLD, IFG, CKD.      "

## 2024-06-23 NOTE — Assessment & Plan Note (Addendum)
 Patient has upcoming appointment with cardiology for pacemaker battery change, patient denies palpitation or chest discomfort.

## 2024-06-23 NOTE — Assessment & Plan Note (Signed)
 Patient denied any chest pain and monitor BP at home. Patient is satisfied with current management and continue to collaborate with cardiology. We will keep monitoring for symptoms

## 2024-06-23 NOTE — Assessment & Plan Note (Addendum)
 Chronic, ongoing.  BP remains below goal for age.  Continue current medication regimen and collaboration with cardiology. Recommend he monitor BP at least a few mornings a week at home and document.  DASH diet at home.  Labs today: CMP, urine ALB.

## 2024-06-23 NOTE — Assessment & Plan Note (Addendum)
 Chronic, ongoing. Euvolemic. Continue current medication and collaboration with cardiology. - Reminded to call for an overnight weight gain of >2 pounds or a weekly weight gain of >5 pounds - not adding salt to food and read food labels. Reviewed the importance of keeping daily sodium intake to 2000mg  daily.  - No Ibuprofen use

## 2024-06-23 NOTE — Assessment & Plan Note (Addendum)
 Chronic, stable. Continue focus on healthy style. Avoid Ibuprofen products. Will send to nephrology if worsening function. LABS: CMP, urine ALB.

## 2024-06-23 NOTE — Assessment & Plan Note (Addendum)
Chronic, no statin use, refuses.  With advanced age this is appropriate, will continue to monitor and focus on diet. 

## 2024-06-23 NOTE — Progress Notes (Deleted)
 "  BP 115/60   Pulse 66   Temp 97.6 F (36.4 C) (Oral)   Ht 5' 10 (1.778 m)   Wt 178 lb (80.7 kg)   SpO2 94%   BMI 25.54 kg/m    Subjective:    Patient ID: Roy Palmer, male    DOB: [birth year], 89 y.o.   MRN: [id number]  HPI: Roy Palmer is a 89 y.o. male  Chief Complaint  Patient presents with   Chronic Kidney Disease   Hyperlipidemia   Hypertension   IFG   HYPERTENSION / HYPERLIPIDEMIA Intolerant to all classes HF therapy. Continues Amiodarone . Cardiology last seen 04/14/24 with no changes.  Echo 09/06/21 EF was 35-40%. History of v tach, ICD in place.  Last pacemaker check was 12/17/23.    Thoracic aortic aneurysm seen on 2017 CT measuring 3.5 AP at that time. Discussed risks and benefits of low dose CT for monitoring- does not wish to obtain at this time, last check was 10/22/19 - 4 cm. Not taking statin, refuses to start. Satisfied with current treatment? {Blank single:19197::yes,no} Duration of hypertension: {Blank single:19197::chronic,months,years} BP monitoring frequency: {Blank single:19197::not checking,rarely,daily,weekly,monthly,a few times a day,a few times a week,a few times a month} BP range:  BP medication side effects: {Blank single:19197::yes,no} Duration of hyperlipidemia: {Blank single:19197::chronic,months,years} Cholesterol supplements: {Blank multiple:19196::none,fish oil,niacin,red yeast rice} Aspirin : {Blank single:19197::yes,no} Recent stressors: {Blank single:19197::yes,no} Recurrent headaches: {Blank single:19197::yes,no} Visual changes: {Blank single:19197::yes,no} Palpitations: {Blank single:19197::yes,no} Dyspnea: {Blank single:19197::yes,no} Chest pain: {Blank single:19197::yes,no} Lower extremity edema: {Blank single:19197::yes,no} Dizzy/lightheaded: {Blank single:19197::yes,no}   Impaired Fasting Glucose HbA1C:  Lab Results  Component Value Date    HGBA1C 5.9 (H) 12/17/2023   Duration of elevated blood sugar:  Polydipsia: {Blank single:19197::yes,no} Polyuria: {Blank single:19197::yes,no} Weight change: {Blank single:19197::yes,no} Visual disturbance: {Blank single:19197::yes,no} Glucose Monitoring: {Blank single:19197::yes,no}    Accucheck frequency: {Blank single:19197::Not Checking,Daily,BID,TID}    Fasting glucose:     Post prandial:  Diabetic Education: {Blank single:19197::Completed,Not Completed} Family history of diabetes: {Blank single:19197::yes,no}   CHRONIC KIDNEY DISEASE (CKD 3b) CKD status: {Blank single:19197::controlled,uncontrolled,better,worse,exacerbated,stable} Medications renally dose: {Blank single:19197::yes,no} Previous renal evaluation: {Blank single:19197::yes,no} Pneumovax:  {Blank single:19197::Up to Date,Not up to Date,unknown} Influenza Vaccine:  {Blank single:19197::Up to Date,Not up to Date,unknown}   Relevant past medical, surgical, family and social history reviewed and updated as indicated. Interim medical history since our last visit reviewed. Allergies and medications reviewed and updated.  Review of Systems  Per HPI unless specifically indicated above     Objective:    BP 115/60   Pulse 66   Temp 97.6 F (36.4 C) (Oral)   Ht 5' 10 (1.778 m)   Wt 178 lb (80.7 kg)   SpO2 94%   BMI 25.54 kg/m   Wt Readings from Last 3 Encounters:  06/23/24 178 lb (80.7 kg)  04/14/24 178 lb (80.7 kg)  12/17/23 178 lb (80.7 kg)    Physical Exam  Results for orders placed or performed in visit on 12/17/23  Bayer DCA Hb A1c Waived   Collection Time: 12/17/23  2:10 PM  Result Value Ref Range   HB A1C (BAYER DCA - WAIVED) 5.9 (H) 4.8 - 5.6 %  Lipid Panel w/o Chol/HDL Ratio   Collection Time: 12/17/23  2:11 PM  Result Value Ref Range   Cholesterol, Total 198 100 - 199 mg/dL   Triglycerides 741 (H) 0 - 149 mg/dL   HDL 43 >60  mg/dL   VLDL Cholesterol Cal 45 (H) 5 - 40 mg/dL   LDL Chol Calc (  NIH) 110 (H) 0 - 99 mg/dL  CBC with Differential/Platelet   Collection Time: 12/17/23  2:11 PM  Result Value Ref Range   WBC 6.8 3.4 - 10.8 x10E3/uL   RBC 4.30 4.14 - 5.80 x10E6/uL   Hemoglobin 13.9 13.0 - 17.7 g/dL   Hematocrit 57.2 62.4 - 51.0 %   MCV 99 (H) 79 - 97 fL   MCH 32.3 26.6 - 33.0 pg   MCHC 32.6 31.5 - 35.7 g/dL   RDW 87.2 88.3 - 84.5 %   Platelets 159 150 - 450 x10E3/uL   Neutrophils 68 Not Estab. %   Lymphs 20 Not Estab. %   Monocytes 8 Not Estab. %   Eos 3 Not Estab. %   Basos 1 Not Estab. %   Neutrophils Absolute 4.6 1.4 - 7.0 x10E3/uL   Lymphocytes Absolute 1.4 0.7 - 3.1 x10E3/uL   Monocytes Absolute 0.5 0.1 - 0.9 x10E3/uL   EOS (ABSOLUTE) 0.2 0.0 - 0.4 x10E3/uL   Basophils Absolute 0.0 0.0 - 0.2 x10E3/uL   Immature Granulocytes 0 Not Estab. %   Immature Grans (Abs) 0.0 0.0 - 0.1 x10E3/uL  Comprehensive metabolic panel with GFR   Collection Time: 12/17/23  2:11 PM  Result Value Ref Range   Glucose 112 (H) 70 - 99 mg/dL   BUN 32 10 - 36 mg/dL   Creatinine, Ser 8.23 (H) 0.76 - 1.27 mg/dL   eGFR 36 (L) >40 fO/fpw/8.26   BUN/Creatinine Ratio 18 10 - 24   Sodium 137 134 - 144 mmol/L   Potassium 4.9 3.5 - 5.2 mmol/L   Chloride 101 96 - 106 mmol/L   CO2 22 20 - 29 mmol/L   Calcium  8.8 8.6 - 10.2 mg/dL   Total Protein 6.4 6.0 - 8.5 g/dL   Albumin  4.1 3.6 - 4.6 g/dL   Globulin, Total 2.3 1.5 - 4.5 g/dL   Bilirubin Total 0.3 0.0 - 1.2 mg/dL   Alkaline Phosphatase 66 44 - 121 IU/L   AST 15 0 - 40 IU/L   ALT 15 0 - 44 IU/L      Assessment & Plan:   Problem List Items Addressed This Visit       Cardiovascular and Mediastinum   Thoracic aortic aneurysm   Nonischemic cardiomyopathy (HCC) - Primary   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      "

## 2024-06-23 NOTE — Assessment & Plan Note (Addendum)
 A1C remains 5.9, stable. Treatment will continue with diet management and lab monitoring

## 2024-06-23 NOTE — Assessment & Plan Note (Addendum)
 Followed by cardiology, continue this collaboration. Recent notes reviewed.

## 2024-06-24 ENCOUNTER — Ambulatory Visit: Payer: Self-pay | Admitting: Nurse Practitioner

## 2024-06-24 LAB — COMPREHENSIVE METABOLIC PANEL WITH GFR
ALT: 14 IU/L (ref 0–44)
AST: 15 IU/L (ref 0–40)
Albumin: 4.4 g/dL (ref 3.6–4.6)
Alkaline Phosphatase: 63 IU/L (ref 48–129)
BUN/Creatinine Ratio: 17 (ref 10–24)
BUN: 30 mg/dL (ref 10–36)
Bilirubin Total: 0.4 mg/dL (ref 0.0–1.2)
CO2: 22 mmol/L (ref 20–29)
Calcium: 9 mg/dL (ref 8.6–10.2)
Chloride: 103 mmol/L (ref 96–106)
Creatinine, Ser: 1.72 mg/dL — ABNORMAL HIGH (ref 0.76–1.27)
Globulin, Total: 1.7 g/dL (ref 1.5–4.5)
Glucose: 95 mg/dL (ref 70–99)
Potassium: 4.8 mmol/L (ref 3.5–5.2)
Sodium: 138 mmol/L (ref 134–144)
Total Protein: 6.1 g/dL (ref 6.0–8.5)
eGFR: 37 mL/min/1.73 — ABNORMAL LOW

## 2024-06-24 LAB — LIPID PANEL W/O CHOL/HDL RATIO
Cholesterol, Total: 220 mg/dL — ABNORMAL HIGH (ref 100–199)
HDL: 43 mg/dL
LDL Chol Calc (NIH): 134 mg/dL — ABNORMAL HIGH (ref 0–99)
Triglycerides: 242 mg/dL — ABNORMAL HIGH (ref 0–149)
VLDL Cholesterol Cal: 43 mg/dL — ABNORMAL HIGH (ref 5–40)

## 2024-06-24 LAB — TSH: TSH: 1.59 u[IU]/mL (ref 0.450–4.500)

## 2024-06-24 NOTE — Progress Notes (Signed)
 Contacted via MyChart  Good morning Roy Palmer, your labs have returned: - Kidney function, creatinine and eGFR, continues to show Stage 3b kidney disease with no worsening. We will continue to monitor and if ever declines into Stage 4 we will get you into kidney doctor. Liver function, AST and ALT, is normal. - Lipid panel continues to show elevations, but I know you prefer not to take statin. Focus on healthy diet and regular exercise. - TSH, thyroid  level, is normal. Any questions? Keep being amazing!!  Thank you for allowing me to participate in your care.  I appreciate you. Kindest regards, Danelia Snodgrass

## 2024-06-28 NOTE — Telephone Encounter (Signed)
 TSH completed. Overdue Magnesium    In accordance with refill protocols, please review and address the following requirements before this medication refill can be authorized:  Chest X-ray, Labs , and Other testing

## 2024-07-23 ENCOUNTER — Ambulatory Visit: Admitting: Student in an Organized Health Care Education/Training Program

## 2024-08-17 ENCOUNTER — Ambulatory Visit

## 2024-12-22 ENCOUNTER — Ambulatory Visit: Admitting: Nurse Practitioner
